# Patient Record
Sex: Female | Born: 1935 | Race: White | Hispanic: No | Smoking: Former smoker
Health system: Southern US, Community
[De-identification: ages and names within clinical notes are randomized; demographics above are authoritative.]

## PROBLEM LIST (undated history)

## (undated) DIAGNOSIS — I739 Peripheral vascular disease, unspecified: Secondary | ICD-10-CM

## (undated) DIAGNOSIS — I2510 Atherosclerotic heart disease of native coronary artery without angina pectoris: Secondary | ICD-10-CM

## (undated) DIAGNOSIS — I10 Essential (primary) hypertension: Secondary | ICD-10-CM

## (undated) DIAGNOSIS — T4145XA Adverse effect of unspecified anesthetic, initial encounter: Secondary | ICD-10-CM

## (undated) DIAGNOSIS — I480 Paroxysmal atrial fibrillation: Secondary | ICD-10-CM

## (undated) DIAGNOSIS — I38 Endocarditis, valve unspecified: Secondary | ICD-10-CM

## (undated) DIAGNOSIS — D509 Iron deficiency anemia, unspecified: Secondary | ICD-10-CM

## (undated) DIAGNOSIS — K5790 Diverticulosis of intestine, part unspecified, without perforation or abscess without bleeding: Secondary | ICD-10-CM

## (undated) DIAGNOSIS — N289 Disorder of kidney and ureter, unspecified: Secondary | ICD-10-CM

## (undated) DIAGNOSIS — K219 Gastro-esophageal reflux disease without esophagitis: Secondary | ICD-10-CM

## (undated) DIAGNOSIS — J302 Other seasonal allergic rhinitis: Secondary | ICD-10-CM

## (undated) DIAGNOSIS — R0989 Other specified symptoms and signs involving the circulatory and respiratory systems: Secondary | ICD-10-CM

## (undated) DIAGNOSIS — G629 Polyneuropathy, unspecified: Secondary | ICD-10-CM

## (undated) DIAGNOSIS — Z9289 Personal history of other medical treatment: Secondary | ICD-10-CM

## (undated) DIAGNOSIS — I272 Pulmonary hypertension, unspecified: Secondary | ICD-10-CM

## (undated) DIAGNOSIS — T8859XA Other complications of anesthesia, initial encounter: Secondary | ICD-10-CM

## (undated) DIAGNOSIS — R55 Syncope and collapse: Secondary | ICD-10-CM

## (undated) DIAGNOSIS — E785 Hyperlipidemia, unspecified: Secondary | ICD-10-CM

## (undated) DIAGNOSIS — G8929 Other chronic pain: Secondary | ICD-10-CM

## (undated) DIAGNOSIS — C4490 Unspecified malignant neoplasm of skin, unspecified: Secondary | ICD-10-CM

## (undated) DIAGNOSIS — R413 Other amnesia: Secondary | ICD-10-CM

## (undated) DIAGNOSIS — Z7901 Long term (current) use of anticoagulants: Secondary | ICD-10-CM

## (undated) DIAGNOSIS — R269 Unspecified abnormalities of gait and mobility: Secondary | ICD-10-CM

## (undated) DIAGNOSIS — M549 Dorsalgia, unspecified: Secondary | ICD-10-CM

## (undated) DIAGNOSIS — F419 Anxiety disorder, unspecified: Secondary | ICD-10-CM

## (undated) DIAGNOSIS — Z8719 Personal history of other diseases of the digestive system: Secondary | ICD-10-CM

## (undated) DIAGNOSIS — M1990 Unspecified osteoarthritis, unspecified site: Secondary | ICD-10-CM

## (undated) DIAGNOSIS — I509 Heart failure, unspecified: Secondary | ICD-10-CM

## (undated) DIAGNOSIS — I341 Nonrheumatic mitral (valve) prolapse: Secondary | ICD-10-CM

## (undated) HISTORY — DX: Other amnesia: R41.3

## (undated) HISTORY — PX: FRACTURE SURGERY: SHX138

## (undated) HISTORY — DX: Endocarditis, valve unspecified: I38

## (undated) HISTORY — PX: TONSILLECTOMY: SUR1361

## (undated) HISTORY — PX: FOOT FRACTURE SURGERY: SHX645

## (undated) HISTORY — DX: Syncope and collapse: R55

## (undated) HISTORY — DX: Polyneuropathy, unspecified: G62.9

## (undated) HISTORY — DX: Paroxysmal atrial fibrillation: I48.0

## (undated) HISTORY — DX: Pulmonary hypertension, unspecified: I27.20

## (undated) HISTORY — DX: Disorder of kidney and ureter, unspecified: N28.9

## (undated) HISTORY — PX: APPENDECTOMY: SHX54

## (undated) HISTORY — DX: Diverticulosis of intestine, part unspecified, without perforation or abscess without bleeding: K57.90

## (undated) HISTORY — DX: Atherosclerotic heart disease of native coronary artery without angina pectoris: I25.10

## (undated) HISTORY — PX: JOINT REPLACEMENT: SHX530

## (undated) HISTORY — DX: Other specified symptoms and signs involving the circulatory and respiratory systems: R09.89

## (undated) HISTORY — PX: UMBILICAL HERNIA REPAIR: SHX196

## (undated) HISTORY — PX: FERTILITY SURGERY: SHX945

## (undated) HISTORY — PX: DILATION AND CURETTAGE OF UTERUS: SHX78

## (undated) HISTORY — DX: Personal history of other medical treatment: Z92.89

## (undated) HISTORY — PX: ABDOMINAL HYSTERECTOMY: SHX81

## (undated) HISTORY — DX: Essential (primary) hypertension: I10

## (undated) HISTORY — DX: Unspecified osteoarthritis, unspecified site: M19.90

## (undated) HISTORY — PX: CARPAL TUNNEL RELEASE: SHX101

## (undated) HISTORY — PX: TEAR DUCT PROBING: SHX793

## (undated) HISTORY — DX: Unspecified abnormalities of gait and mobility: R26.9

## (undated) HISTORY — PX: BUNIONECTOMY: SHX129

## (undated) HISTORY — PX: CHOLECYSTECTOMY OPEN: SUR202

---

## 1998-05-04 ENCOUNTER — Emergency Department (HOSPITAL_COMMUNITY): Admission: EM | Admit: 1998-05-04 | Discharge: 1998-05-04 | Payer: Self-pay | Admitting: Emergency Medicine

## 1998-12-12 ENCOUNTER — Other Ambulatory Visit: Admission: RE | Admit: 1998-12-12 | Discharge: 1998-12-12 | Payer: Self-pay | Admitting: *Deleted

## 2000-02-27 ENCOUNTER — Other Ambulatory Visit: Admission: RE | Admit: 2000-02-27 | Discharge: 2000-02-27 | Payer: Self-pay | Admitting: *Deleted

## 2000-07-04 ENCOUNTER — Other Ambulatory Visit: Admission: RE | Admit: 2000-07-04 | Discharge: 2000-07-04 | Payer: Self-pay | Admitting: Orthopedic Surgery

## 2001-04-03 ENCOUNTER — Other Ambulatory Visit: Admission: RE | Admit: 2001-04-03 | Discharge: 2001-04-03 | Payer: Self-pay | Admitting: Gastroenterology

## 2001-04-03 ENCOUNTER — Encounter (INDEPENDENT_AMBULATORY_CARE_PROVIDER_SITE_OTHER): Payer: Self-pay | Admitting: Specialist

## 2002-10-08 ENCOUNTER — Encounter: Admission: RE | Admit: 2002-10-08 | Discharge: 2002-10-08 | Payer: Self-pay | Admitting: Neurology

## 2002-10-08 ENCOUNTER — Encounter: Payer: Self-pay | Admitting: Neurology

## 2002-12-24 ENCOUNTER — Encounter: Payer: Self-pay | Admitting: Internal Medicine

## 2002-12-24 ENCOUNTER — Ambulatory Visit (HOSPITAL_COMMUNITY): Admission: RE | Admit: 2002-12-24 | Discharge: 2002-12-24 | Payer: Self-pay | Admitting: Pulmonary Disease

## 2003-03-12 ENCOUNTER — Encounter: Payer: Self-pay | Admitting: Emergency Medicine

## 2003-03-12 ENCOUNTER — Emergency Department (HOSPITAL_COMMUNITY): Admission: EM | Admit: 2003-03-12 | Discharge: 2003-03-12 | Payer: Self-pay | Admitting: Emergency Medicine

## 2004-01-24 ENCOUNTER — Other Ambulatory Visit: Admission: RE | Admit: 2004-01-24 | Discharge: 2004-01-24 | Payer: Self-pay | Admitting: Internal Medicine

## 2004-10-15 ENCOUNTER — Emergency Department (HOSPITAL_COMMUNITY): Admission: EM | Admit: 2004-10-15 | Discharge: 2004-10-15 | Payer: Self-pay | Admitting: Emergency Medicine

## 2004-11-08 ENCOUNTER — Ambulatory Visit: Payer: Self-pay | Admitting: Gastroenterology

## 2005-01-01 ENCOUNTER — Emergency Department (HOSPITAL_COMMUNITY): Admission: EM | Admit: 2005-01-01 | Discharge: 2005-01-01 | Payer: Self-pay | Admitting: Family Medicine

## 2005-04-20 ENCOUNTER — Ambulatory Visit: Payer: Self-pay | Admitting: Gastroenterology

## 2005-05-03 ENCOUNTER — Ambulatory Visit: Payer: Self-pay | Admitting: Gastroenterology

## 2005-11-14 ENCOUNTER — Ambulatory Visit: Payer: Self-pay | Admitting: Gastroenterology

## 2005-12-19 ENCOUNTER — Encounter: Admission: RE | Admit: 2005-12-19 | Discharge: 2005-12-19 | Payer: Self-pay | Admitting: Neurology

## 2006-01-25 ENCOUNTER — Ambulatory Visit (HOSPITAL_COMMUNITY): Admission: RE | Admit: 2006-01-25 | Discharge: 2006-01-26 | Payer: Self-pay | Admitting: General Surgery

## 2006-02-27 ENCOUNTER — Ambulatory Visit: Payer: Self-pay | Admitting: Gastroenterology

## 2006-04-03 ENCOUNTER — Ambulatory Visit: Payer: Self-pay | Admitting: Gastroenterology

## 2006-07-30 ENCOUNTER — Ambulatory Visit: Payer: Self-pay | Admitting: Gastroenterology

## 2006-08-05 ENCOUNTER — Ambulatory Visit: Payer: Self-pay | Admitting: Gastroenterology

## 2007-01-27 ENCOUNTER — Ambulatory Visit: Payer: Self-pay | Admitting: Gastroenterology

## 2007-09-08 ENCOUNTER — Other Ambulatory Visit: Admission: RE | Admit: 2007-09-08 | Discharge: 2007-09-08 | Payer: Self-pay | Admitting: Internal Medicine

## 2007-09-19 DIAGNOSIS — Z9289 Personal history of other medical treatment: Secondary | ICD-10-CM

## 2007-09-19 HISTORY — DX: Personal history of other medical treatment: Z92.89

## 2007-12-09 ENCOUNTER — Encounter: Admission: RE | Admit: 2007-12-09 | Discharge: 2007-12-10 | Payer: Self-pay | Admitting: Internal Medicine

## 2008-02-14 ENCOUNTER — Emergency Department (HOSPITAL_COMMUNITY): Admission: EM | Admit: 2008-02-14 | Discharge: 2008-02-14 | Payer: Self-pay | Admitting: Emergency Medicine

## 2008-03-20 HISTORY — PX: REPLACEMENT TOTAL KNEE: SUR1224

## 2008-03-31 ENCOUNTER — Inpatient Hospital Stay (HOSPITAL_COMMUNITY): Admission: RE | Admit: 2008-03-31 | Discharge: 2008-04-04 | Payer: Self-pay | Admitting: Orthopedic Surgery

## 2008-04-12 ENCOUNTER — Emergency Department (HOSPITAL_COMMUNITY): Admission: EM | Admit: 2008-04-12 | Discharge: 2008-04-12 | Payer: Self-pay | Admitting: Emergency Medicine

## 2008-04-15 ENCOUNTER — Encounter: Admission: RE | Admit: 2008-04-15 | Discharge: 2008-04-15 | Payer: Self-pay | Admitting: Orthopedic Surgery

## 2011-01-02 NOTE — Assessment & Plan Note (Signed)
[facility]                         GASTROENTEROLOGY OFFICE NOTE   NAME:Thayne, [name]                   MRN:          [id number]  DATE:01/27/2007                            DOB:          October 02, 1921    The patient has been in this practice for a number of years. She has  seen all my brothers and myself and is now concerned because we are  retiring and she wants to see who should she followup with in the  future. She says she is here just to talk before I leave. She does have  some mild gastritis and some diverticular disease which is mild as well.  Her last colonoscopic examination was in September 2006 and revealed  only some hemorrhoids and some diverticular disease as we noted, it was  moderately severe. Exam at the same time revealed the 5-cm hiatal hernia  with a stricture which we dilated and some mild to moderate hemorrhagic  gastritis. In the meantime, she has been taking medication for her  stomach and which has been Prevacid, SolTabs and Sucralfate, Pepcid as  needed along with a number of other medicines.   PHYSICAL EXAMINATION:  GENERAL:  She comes in, she looks good.  VITAL SIGNS:  She weighs 144, blood pressure 98/62, pulse 80 and normal.  HEENT:  Her oropharynx is negative.  NECK:  Negative.  CHEST:  Clear.  HEART:  Regular rhythm without significant murmur.  ABDOMEN:  Soft, no mass, no organomegaly, nondistended, nontender.  EXTREMITIES:  Unremarkable.  RECTAL:  Deferred.   IMPRESSION:  1. Abdominal pain in a patient who is status post cholecystectomy and      has mild hiatal hernia and gastritis.  2. Mild to moderate diverticulosis.  3. History of gastroesophageal reflux disease. The patient is status      post cholecystectomy and gastroenteritis.  4. She also has known multiple allergies, mitral valve disease,      arthritis and mild neuropathy treated with Neurontin.   RECOMMENDATIONS:  See Dr. Leone Payor in the future in  followup and after my  retirement and to be treated accordingly. Her medications as noted  included some Prevacid, SolTab and some Sucralfate.     Ulyess Mort, MD  Electronically Signed    SML/MedQ  DD: 01/27/2007  DT: 01/28/2007  Job #: [id number]

## 2011-01-02 NOTE — H&P (Signed)
NAMEGEORGI, [name] NO.:  [id number]   MEDICAL RECORD NO.:  [id number]          PATIENT TYPE:  INP   LOCATION:  1605                         FACILITY:  [facility]   PHYSICIAN:  Ollen Gross, M.D.    DATE OF BIRTH:  [birth year]   DATE OF ADMISSION:  03/31/2008  DATE OF DISCHARGE:                              HISTORY & PHYSICAL   OFFICE VISIT HISTORY AND PHYSICAL:  Of note, the date of the office  visit history and physical was March 19, 2008   CHIEF COMPLAINT:  Right knee pain.   HISTORY OF THE PRESENT ILLNESS:  The patient is an 77 year old female  who has been seen by Dr. Lequita Halt for ongoing right knee pain.  She has  known arthritis in the right knee, which has been end-stage.  She has  been treated conservatively for this in the past utilizing medications  and also a trial of Synvisc this past year for which she has only gotten  temporary short-term relief with this.  She has reached the point now  where she would like to have something done about it.  She has been seen  preoperatively by Dr. Tresa Endo and is felt to be stable for surgery   ALLERGIES:  SULFA MEDICATIONS cause swelling within her face.   CURRENT MEDICATIONS:  Claritin, flunisolide nasal solution, flaxseed  oil, gabapentin, benazepril hydrochloride, atenolol, simvastatin,  Tricor, Lidoderm patches, Vicodin, sucralfate, Prevacid, Restasis; and,  also additional vitamins of calcium with vitamin D, Senior vitamin,  vitamin B12, _________, and probiotic   PAST MEDICAL HISTORY:  1. Anxiety.  2. Cataracts.  3. Tinnitus.  4. Chronic dry eyes.  5. Hypertension.  6. Peripheral neuropathy.  7. Hyperlipidemia.  8. Mitral valve prolapse with mild-to-moderate mitral regurgitation.  9. Moderate tricuspid regurgitation.  10.Hiatal hernia.  11.Gastroesophageal reflux disease.  12.Hemorrhoids.  13.Diverticulosis.  14.Past history of Clostridium difficile.  15.Osteoporosis.  16.Eczema.  17.Left  ventricular hypertrophy with diastolic dysfunction.  18.Moderate pulmonary hypertension   PAST SURGICAL HISTORY:  1. Tonsils and adenoids in 1930.  2. Appendectomy in 1937.  3. Uterine suspension procedure in 1957.  4. Hysterectomy in 1958.  5. Bilateral bunions in 1970.  6. Gallbladder surgery in 1973.  7. Left wrist cyst excision in 1979.  8. Umbilical hernia repair in 2007.  9. Toe surgery, right foot in 2008.   FAMILY HISTORY:  Father had heart disease and gastric cancer.  Mother  had hypertension and rheumatoid arthritis.  Cancer and heart disease run  in the family.   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  She is a past  smoker, quit about 44 years ago.  No alcohol.  The patient has two  children.  She does have a caregiver lined up for surgery.   REVIEW OF SYSTEMS:  GENERAL:  No fevers, chills or night sweats.  NEUROLOGICAL:  A little bit of ringing in her ears with her tinnitus.  No seizures or syncope.  RESPIRATORY:  No shortness breath, productive  cough or hemoptysis; although, she does have some allergies.  CARDIOVASCULAR:  She has had palpitations in the  past.  No chest pain,  angina or orthopnea.  GASTROINTESTINAL:  Some intermittent nausea and  heartburn reflux.  No vomiting.  Denies constipation.  Occasional  diarrhea.  GENITOURINARY:  A little bit of urinary retention, which is  controlled, and nocturia.  No dysuria.  MUSCULOSKELETAL:  Joint pain and  swelling   PHYSICAL EXAMINATION:  VITAL SIGNS: Pulse 56, respirations 12 and blood  pressure 142/64.  GENERAL APPEARANCE:  In general this is an 77 year old white female well-  nourished, well-developed, and in no acute distress.  She is alert,  oriented and cooperative; and, is accompanied by her husband.  HEENT:  Normocephalic and atraumatic.  Pupils are round and react.  Oropharynx is clear.  EOMs are intact.  NECK:  The neck is supple.  No bruits.  CHEST:  The chest is clear.  Anterior  __________  chest walls.  HEART:  The heart has a regular rate and rhythm with a grade 2/6  systolic ejection murmur heard over the aortic point, pulmonic point,  mitral, and Erb's point.  ABDOMEN:  The abdomen is soft and nontender.  Bowel sounds are present.  RECTAL, BREASTS AND GENITALIA:  The rectal, breast and genitalia exams  are not done as they are not pertinent to the history of present  illness.  EXTREMITIES:  The right knee has moderate crepitus, but no tenderness  and no instability.  Valgus malalignment deformity.   IMPRESSION:  Osteoarthritis, right knee.   PLAN:  The patient was admitted to [facility] to undergo a  right total knee replacement arthroplasty.  Surgery will be performed by  Dr. Ollen Gross.      Alexzandrew L. Perkins, P.A.C.      Ollen Gross, M.D.  Electronically Signed    ALP/MEDQ  D:  04/01/2008  T:  04/02/2008  Job:  [id number]   cc:   Soyla Murphy. Renne Crigler, M.D.  Fax: [phone]   Nicki Guadalajara, M.D.  Fax: [phone]   Areatha Keas, M.D.  Fax: [phone]   Kerry Kass, M.D. [facility]  [address]  [place]  [place] [address]   Georgiana Spinner, M.D.  Fax: [phone]

## 2011-01-02 NOTE — Op Note (Signed)
[name], [name] NO.:  [id number]   MEDICAL RECORD NO.:  [id number]          PATIENT TYPE:  INP   LOCATION:  1605                         FACILITY:  [facility]   PHYSICIAN:  Ollen Gross, M.D.    DATE OF BIRTH:  04-03-1922   DATE OF PROCEDURE:  03/31/2008  DATE OF DISCHARGE:                               OPERATIVE REPORT   PREOPERATIVE DIAGNOSIS:  Osteoarthritis, right knee.   POSTOPERATIVE DIAGNOSIS:  Osteoarthritis, right knee.   PROCEDURE:  Right total knee arthroplasty.   SURGEON:  Ollen Gross, M.D.   ASSISTANT:  Avel Peace, PA-C.   ANESTHESIA:  Spinal with Duramorph.   ESTIMATED BLOOD LOSS:  Minimal.   DRAINS:  None.   TOURNIQUET TIME:  32 minutes at 300 mmHg.   COMPLICATIONS:  None.   CONDITION.:  Stable to recovery.   CLINICAL NOTE:  [name] is an 77 year old female with end-stage  arthritis of the right knee with progressively worsening pain and  dysfunction.  She has failed nonoperative management and presents for  total knee arthroplasty.   PROCEDURE IN DETAIL:  After successful administration of spinal  anesthetic, a tourniquet was placed on her right thigh and the right  lower extremity prepped and draped in the usual sterile fashion.  The  extremity was wrapped in an Esmarch, the knee flexed, and the tourniquet  inflated to 300 mmHg.  A midline incision was made with 10 blade through  the subcutaneous tissue to the level of the extensor mechanism.  A fresh  blade was used make a medial parapatellar arthrotomy.  The soft tissue  over the proximal medial tibia was subperiosteally elevated to the joint  line with the knife and into the semimembranosus bursa with a Cobb  elevator.  The soft tissue laterally was elevated, with attention being  paid to avoid the patellar tendon on the tibial tubercle.  The patella  subluxed laterally, the knee flexed 90 degrees and the ACL and PCL  removed.  A drill was used to create a starting  hole in the distal  femur.  The canal was thoroughly irrigated.  A 5-degree right valgus  alignment guide was placed referencing off the posterior condyles,  rotations marked and the block pinned to remove 11 mm of the distal  femur.  I took 11 because of her preop flexion contracture.  Distal  femoral resection was made with an oscillating saw.  Size 3 is the most  appropriate femoral component and the size 3 cutting block was placed  with rotation marked off the epicondylar axis.  The anterior, posterior  and chamfer cuts were then made.   The tibia subluxed forward and menisci removed.  The extramedullary  tibial alignment guide was placed referencing proximally at the medial  aspect of the tibial tubercle and distally along the second metatarsal  axis and tibial crest.  The block was pinned to remove 2 mm of the more  deficient lateral side.  Tibial resection was made with an oscillating  saw.  A size 3 was also the most appropriate tibial size and the  proximal tibia was prepared  with the modular drill and keel punch for a  size 3.  Femoral preparation was completed with the intercondylar cut.   A size 3 mobile bearing tibial trial, size 3 posterior stabilized  femoral trial, and a 12.5-mm posterior stabilized rotating platform  insert trial were placed.  With the 12.5, full extension was achieved  with excellent varus, valgus, anterior and posterior balance throughout  a full range of motion.  The patella was then everted, thickness  measured to 23 mm.  Freehand resection was taken to 14 mm, a 38 template  was placed, lug holes were drilled, and a trial patella was placed and  it tracked normally.  Osteophytes were removed off the posterior femur  with the trial in place.  All trials were removed, then the cut bone  surfaces were prepared with pulsatile lavage.  Cement was mixed and once  ready for implantation, the size 3 mobile bearing tibial tray, size 3  posterior stabilized  femur, and 38 patella were cemented into place and  the patella was held with a clamp.  The trial 12.5-mm insert was placed,  the knee held in full extension and all extruded cement removed.  Once  the cement was fully hardened, then the permanent 12.5-mm posterior  stabilized rotating platform insert was placed in the tibial tray.  It  was copiously irrigated with saline solution.  FloSeal was injected on  the posterior capsule and then injected into the medial and lateral  gutters and suprapatellar area.  A moist sponge was placed and the  tourniquet released for total time of 32 minutes.  The sponge was held  for 2 minutes, then removed.  Minimal bleeding was encountered.  That  which was encountered was stopped with electrocautery.  The wound was  again further irrigated with saline solution and the arthrotomy closed  with interrupted #1 PDS.  Flexion against gravity was 135 degrees.  The  patella tracked normally.  The subcu was closed with interrupted 2-0  Vicryl and subcuticular running 4-0 Monocryl.  The incision was cleaned  and dried and Steri-Strips and a bulky sterile dressing applied.  She  was placed into a knee immobilizer, awakened and transported to recovery  in stable condition.      Ollen Gross, M.D.  Electronically Signed     FA/MEDQ  D:  03/31/2008  T:  03/31/2008  Job:  [id number]

## 2011-01-05 NOTE — Assessment & Plan Note (Signed)
[facility]                           GASTROENTEROLOGY OFFICE NOTE   NAME:[name], [name]                   MRN:          [id number]  DATE:02/27/2006                            DOB:          [birth year]    PROBLEM:  Diarrhea.   HISTORY:  [name] is a very nice 77 year old white female known to Dr. Victorino Dike who has a history of chronic GERD which is currently well  controlled. She was having difficulty with a periumbilical hernia which she  recently has had repaired in June 2007 and is doing well postoperatively.  She last had colonoscopy in September 2006 which showed moderate  diverticulosis and internal and external hemorrhoids.   The patient says that she has always tended towards looser stools, but is  concerned now that she may have an infection.  She says her husband, who is  now living at the [facility], has been having a hard time with C.  difficile colitis and has been on treatment intermittently since February.  She has spent a lot of time with him and is concerned that she may have  contracted it.  She says that her stools are different now. She usually  has a formed stool in the morning and then two to three watery stools later  in the day. She has not noted any melena or hematochezia.  She has not had  any documented fever, but has had some episodes of hot flashes.  She is  not having any nocturnal diarrhea. Her appetite has been fair. She has lost  weight over the past few months and  by our records is down 11 pounds since  March, but says she has been under a lot of stress with her husband's  illness, etc., and feels that she has not been eating because of stress.  She,  herself, has not been treated with any antibiotics other than a single  dose about a month ago prior to a dental visit.  She does not believe that  she has had any antibiotics around the time of her recent surgery.   CURRENT MEDICATIONS:  1.  Neurontin 600  mg t.i.d.  2.  Atenolol one-half daily.  3.  Lotrel 5/20 daily.  4.  Hydrochlorothiazide one daily.  5.  Tricor 200 daily.  6.  Multivitamin daily.  7.  Vitamin E and C daily.  8.  Prevacid Solutab 30 daily.  9.  Metoclopramide 10 mg one -half tablet with dinner and a whole q.h.s.  10. Flax seed oil t.i.d.  11. Carafate t.i.d. p.r.n.  12. Vicodin p.r.n.   ALLERGIES:  SULFA and LATEX.   PHYSICAL EXAMINATION:  GENERAL: Well-developed, elderly white female in no  acute distress.  VITAL SIGNS:  Weight is 142.4, blood pressure 118/54, pulse 60.  CARDIOVASCULAR:  Regular rhythm with normal S1 S2.  There are no murmurs,  gallops or rubs.  PULMONARY:  Clear to A&P.  ABDOMEN:  Soft.  Bowel sounds are active, somewhat  hyperactive.  She is  nontender. She has no palpable mass or hepatosplenomegaly.  RECTAL EXAM:  Not done at this  time.   IMPRESSION:  1.  An 77 year old white female status post recent periumbilical hernia      repair, doing well.  2.  Diarrhea times two to three months.  Rule out Clostridium difficile.   PLAN:  1.  Check stool for Clostridium difficile.  2.  Flagyl 250 mg p.o. q.i.d. times two weeks.  3.  Fluoroquinolone one p.o. daily times four days.  4.  Return to office in one month or sooner p.r.n.                                   Mike Gip, PA-C                                Ulyess Mort, MD   AE/MedQ  DD:  02/27/2006  DT:  02/27/2006  Job #:  [id number]

## 2011-01-05 NOTE — Discharge Summary (Signed)
[name], [name] NO.:  [id number]   MEDICAL RECORD NO.:  [id number]          PATIENT TYPE:  EMS   LOCATION:  ED                           [facility]ACILITY[facility]  [facility]   PHYSICIAN:  Ollen Gross, M.D.    DATE OF BIRTH:  12/26/1921   DATE OF ADMISSION:  04/12/2008  DATE OF DISCHARGE:  04/12/2008                               DISCHARGE SUMMARY   ADMISSION DIAGNOSES:  1. Osteoarthritis right knee.  2. Anxiety.  3. Cataracts  4. Tinnitus.  5. Chronic dry eyes.  6. Hypertension.  7. Peripheral neuropathy.  8. Hyperlipidemia.  9. Mitral valve prolapse with mild to moderate mitral regurgitation.  10.Moderate tricuspid regurgitation.  11.Hiatal hernia.  12.Reflux disease.  13.Hemorrhoids.  14.Diverticulosis.  15.Past history of Clostridium difficile.  16.Osteoporosis.  17.Left ventricular hypertrophy with diastolic dysfunction.  18.Moderate pulmonary hypertension.   DISCHARGE DIAGNOSIS:  1. Osteoarthritis right knee status post right total knee replacement      arthroplasty.  2. Anxiety.  3. Cataracts  4. Tinnitus.  5. Chronic dry eyes.  6. Hypertension.  7. Peripheral neuropathy.  8. Hyperlipidemia.  9. Mitral valve prolapse with mild to moderate mitral regurgitation.  10.Moderate tricuspid regurgitation.  11.Hiatal hernia.  12.Reflux disease.  13.Hemorrhoids.  14.Diverticulosis.  15.Past history of Clostridium difficile.  16.Osteoporosis.  17.Left ventricular hypertrophy with diastolic dysfunction.  18.Moderate pulmonary hypertension.  19.Postop blood loss anemia.  20.Status post transfusion without sequelae.   PROCEDURE:  March 31, 2008 right total knee.  Surgeon Dr. Lequita Halt.  Assistant Avel Peace, PA-C.  Spinal anesthesia with Duramorph added.   CONSULTS:  None.   BRIEF HISTORY:  [name] is an 77 year old female with end-stage  arthritis of right knee, progressive worsening pain and dysfunction,  failed nonoperative management now presents  for total knee are   LABORATORY DATA:  Preop CBC showed hemoglobin 30.3, hematocrit 38.5,  white cell count 5.6, platelets 251,000.  Postop hemoglobin 10 drifted  down to 9.1, got as low as 8.4 given blood, post transfusion hemoglobin  10.2 and 29.6.  PT/PTT preop 13.6 and 40 respectively.  INR 1.0.  Serial  pro time followed.  PT/INR 20.8 and 1.7.  Chem panel on admission all  within normal limits.  Serial BMET followed.  Electrolytes remained  within normal limits preop UA negative.  Blood group type O positive.   EKG dated Dec 31, 2007 normal sinus rhythm, rate 59 left axis, probably  normal for age, confirmed looks like by Dr. Daphene Jaeger.   HOSPITAL COURSE:  The patient admitted to [facility] and  tolerated procedure well, later transferred to the recovery room  orthopedic floor started on PCA and p.o. analgesics pain control through  the night and into the next day.  Did pretty well after surgery given 24  hours postop IV antibiotics.  Main complaint was neuropathy type pain  with her feet.  She was on gabapentin and that was continued.  Home  medications were renewed.  She had decent output started getting up out  of bed day one.  By day two she had been weaned over p.m. meds  and  discontinued PCA and the fluids.  Hemoglobin was down a little bit to  9.1.  Placed on iron, monitor for symptoms.  She was hemodynamically  stable at that point.  Was getting up out of bed and she did much better  with a therapy by day two.  Walking about 120 feet or more that  afternoon.  Continued to progress well.  By day three she was doing  well.  Hemoglobin was a little bit low.  It is felt she would benefit  undergoing blood.  She was given 1 unit blood especially due to her  cardiac history.  She received blood, tolerated well.  Continued with  therapy by the following day of April 04, 2008 hemoglobin was back up  to 10.2, progressing well with therapy and discharged home.    DISCHARGE/PLAN:  1. Was discharged home on April 04, 2008.  2. Discharge diagnoses please see above.  3. Discharge meds Percocet, Robaxin, Coumadin.   DIET:  Low cholesterol heart healthy diet.   ACTIVITY:  Weightbearing as tolerated right leg, total knee protocol.  Home health PT and home nursing followup kin 2 weeks.  May start  showering, do not submerge incision in water.   DISPOSITION:  Home.   CONDITION ON DISCHARGE:  Improving.      Alexzandrew L. Perkins, P.A.C.      Ollen Gross, M.D.  Electronically Signed    ALP/MEDQ  D:  05/13/2008  T:  05/13/2008  Job:  [id number]   cc:   Soyla Murphy. Renne Crigler, M.D.  Fax: [phone]   Nicki Guadalajara, M.D.  Fax: [phone]   Areatha Keas, M.D.  Fax: [phone]   Kerry Kass, M.D. [facility]  [address]  [place]  [place] [address]   Georgiana Spinner, M.D.  Fax: [phone]

## 2011-01-05 NOTE — Op Note (Signed)
NAMESHAMIEKA, GULLO NO.:  [id number]   MEDICAL RECORD NO.:  [id number]          PATIENT TYPE:  AMB   LOCATION:  DAY                          FACILITY:  [facility]   PHYSICIAN:  Gita Kudo, M.D. DATE OF BIRTH:  Apr 04, 1922   DATE OF PROCEDURE:  01/25/2006  DATE OF DISCHARGE:                                 OPERATIVE REPORT   OPERATIVE PROCEDURE:  Repair of umbilical hernia with Ventralex mesh--6.4 cm   SURGEON:  Gita Kudo, M.D.   ANESTHESIA:  General.   PREOPERATIVE DIAGNOSIS:  Umbilical hernia.   POSTOP DIAGNOSIS:  Umbilical hernia.   CLINICAL SUMMARY:  A delightful, spry, 77 year old woman brought in for  elective umbilical hernia repair.  She has an enlarging hernia that is  symptomatic especially when she tries to assist caring for her sick husband.   OPERATIVE FINDINGS:  The patient is an umbilical hernia with a defect of  about 3 cm in size.  There were no incarcerated contents.   OPERATIVE PROCEDURE:  Under satisfactory general endotracheal anesthesia,  having received 1 gram Ancef preop, the patient was positioned, prepped and  draped in a standard fashion.  A curved supraumbilical incision was made and  carried down into the subcu.  Using cautery, the plane was developed between  the hernia sac and the subcu, and extended all the way down to the fascia.  The umbilicus was dissected off the hernia sac inferiorly.  With good  exposure, the sac was then circumscribed with cautery at the fascia-sac  junction and excess sac trimmed away.  The sac was then closed with a  running 2-0 Vicryl suture.   Finger dissection used to develop the preperitoneal plane; and over the sac  and under the fascia the medium sized, 6.4 cm circle of Ventralex mesh was  placed with the smooth side down.  Quadrant sutures of #0 Prolene were  placed through fascia, through the mesh, taking full-thickness bites in at  least two quadrants, and then back up through the  fascia and tied.  When  tied, the mesh lay in good apposition to the undersurface of the abdominal  wall.  Then the defect was closed in a transverse direction taking  intermittent bites of the mesh with figure-of-eight #0 Prolene  suture.  The wound was lavaged with saline, infiltrated with Marcaine, and  closed in layers with 2-0 Vicryl, 3-0 Vicryl, and Steri-Strips.  Sterile  absorbent dressings were applied; and the patient went to the recovery room  from the operating room in good condition without complication.           ______________________________  Gita Kudo, M.D.     MRL/MEDQ  D:  01/25/2006  T:  01/25/2006  Job:  [id number]   cc:   Soyla Murphy. Renne Crigler, M.D.  Fax: [phone]   Ulyess Mort, M.D. [facility]  [address]  [place]  [place] [address]

## 2011-01-05 NOTE — Assessment & Plan Note (Signed)
[facility]                           GASTROENTEROLOGY OFFICE NOTE   NAME:Cerra, [name]                   MRN:          [id number]  DATE:04/03/2006                            DOB:          1922/05/24    Arely comes in says she is doing fine.  She is such a lovely lady.  She  could not be sweeter.  She looks relatively good for her age.  She says she  has been having some fairly frequent stools but they are only in the  morning, like 2-3 in the morning.  I think it certainly relates to her  diverticular disease and she has some urgency but she has never had an  accident which she is very happy about.  She says she can live with it.  She  does have some GERD.   PRESENT MEDICATIONS:  Neurontin, atenolol, Lotrel, hydrochlorothiazide,  TriCor, multivitamin, calcium, Prevacid SoluTabs 30 mg daily, metoclopramide  1/2 b.i.d. which we are going to discontinue, flaxseed oil.  Sucralfate 1  gram t.i.d., we are going to switch this to a suspension.  Vicodin p.r.n.  and Claritin, allergy shots, Restasis. Marland Kitchen   PHYSICAL EXAMINATION:  VITAL SIGNS:  Today she weighed 141, blood pressure  128/78, pulse 58 and regular. Neck and extremities are unremarkable.   IMPRESSION:  1. Diarrhea several times a day which is probably chronic and probably      related to diverticular disease.  2. History of periumbilical hernia, doing well with repair.  3. Mild obesity.  4. History of allergies.  5. Diffuse arthritis.  6. Mild neuropathy on Neurontin.  7. Status post cholecystectomy.   RECOMMENDATIONS:  Is that she stop her Reglan as indicated, try on Prevacid  SoluTab b.i.d. or as needed.  I gave her multiple samples, and I told her  not to worry about seeds in her food since there is evidence that this  really does not contribute to diverticulitis and this will help her as well.  I told her she should cut back on the Prevacid SoluTabs if she can tolerate  it  satisfactorily and she should let me know if she has any further  difficulty.                                   Ulyess Mort, MD   SML/MedQ  DD:  04/03/2006  DT:  04/03/2006  Job #:  [id number]

## 2011-01-05 NOTE — Assessment & Plan Note (Signed)
[facility]                         GASTROENTEROLOGY OFFICE NOTE   NAME:[name], [name]                   MRN:          [id number]  DATE:08/05/2006                            DOB:          1922/07/25    [name] says she is doing better. She says she is having probably just 2-  3 stools a day. Sometimes she sees some mucous, no blood. She does have  some stomach pains and nausea at times. Took fiber and says she is  having trouble with gas. Took over-the-counter antidiarrheal and anti-  gas, she is now feeling somewhat better.   PHYSICAL EXAMINATION:  She looks fine. She weighs 139, blood pressure  116/62, pulse 56 and regular. __________  unremarkable.   IMPRESSION:  Resolving diarrhea.   RECOMMENDATIONS:  Try some Xifaxan. I gave her a week supply of just one  a day and some Flora-Q to take one a day. I told her if this helped her  we could try some more but I just did not want her to spend an undue  amount for this if it was not going to do her any good. She does not  have the finances to waste on medication unless it would be helpful to  her and I told her if her symptoms recur or persisted just to let us  know.     Ulyess Mort, MD  Electronically Signed    SML/MedQ  DD: 08/05/2006  DT: 08/06/2006  Job #: [id number]

## 2011-01-05 NOTE — Assessment & Plan Note (Signed)
[facility]                           GASTROENTEROLOGY OFFICE NOTE   NAME:[name], [name]                   MRN:          [id number]  DATE:02/27/2006                            DOB:          Jul 04, 1922    NO DICTATION.                                   Ulyess Mort, MD   SML/MedQ  DD:  02/27/2006  DT:  02/28/2006  Job #:  [id number]

## 2011-05-18 LAB — BASIC METABOLIC PANEL
BUN: 12
BUN: 8
CO2: 31
CO2: 32
Calcium: 8.9
Calcium: 9.1
Chloride: 104
Chloride: 106
Creatinine, Ser: 0.75
Creatinine, Ser: 0.82
GFR calc Af Amer: >60
GFR calc Af Amer: >60
GFR calc non Af Amer: >60
GFR calc non Af Amer: >60
Glucose, Bld: 113 — ABNORMAL HIGH
Glucose, Bld: 146 — ABNORMAL HIGH
Potassium: 3.6
Potassium: 4.1
Sodium: 140
Sodium: 142

## 2011-05-18 LAB — APTT: aPTT: 40 — ABNORMAL HIGH

## 2011-05-18 LAB — COMPREHENSIVE METABOLIC PANEL
ALT: 20
AST: 22
Albumin: 4.2
Alkaline Phosphatase: 41
BUN: 21
CO2: 30
Calcium: 10.3
Chloride: 103
Creatinine, Ser: 0.89
GFR calc Af Amer: >60
GFR calc non Af Amer: >60
Glucose, Bld: 112 — ABNORMAL HIGH
Potassium: 4.2
Sodium: 140
Total Bilirubin: 0.8
Total Protein: 7.3

## 2011-05-18 LAB — CBC
HCT: 26.3 — ABNORMAL LOW
HCT: 29 — ABNORMAL LOW
HCT: 38.5
Hemoglobin: 10 — ABNORMAL LOW
Hemoglobin: 13.3
Hemoglobin: 9.1 — ABNORMAL LOW
MCHC: 34.4
MCHC: 34.5
MCHC: 34.6
MCV: 90.7
MCV: 90.8
MCV: 91.2
Platelets: 170
Platelets: 198
Platelets: 251
RBC: 2.88 — ABNORMAL LOW
RBC: 3.19 — ABNORMAL LOW
RBC: 4.25
RDW: 11.9
RDW: 12.1
RDW: 12.3
WBC: 10.3
WBC: 5.6
WBC: 7.7

## 2011-05-18 LAB — PROTIME-INR
INR: 1
INR: 1.1
INR: 1.7 — ABNORMAL HIGH
Prothrombin Time: 13.6
Prothrombin Time: 14.6
Prothrombin Time: 20.4 — ABNORMAL HIGH

## 2011-05-18 LAB — TYPE AND SCREEN
ABO/RH(D): O POS
Antibody Screen: NEGATIVE

## 2011-05-18 LAB — URINALYSIS, ROUTINE W REFLEX MICROSCOPIC
Bilirubin Urine: NEGATIVE
Glucose, UA: NEGATIVE
Hgb urine dipstick: NEGATIVE
Ketones, ur: NEGATIVE
Nitrite: NEGATIVE
Protein, ur: NEGATIVE
Specific Gravity, Urine: 1.017
Urobilinogen, UA: 0.2
pH: 5.5

## 2011-05-18 LAB — ABO/RH: ABO/RH(D): O POS

## 2011-07-10 ENCOUNTER — Emergency Department (HOSPITAL_BASED_OUTPATIENT_CLINIC_OR_DEPARTMENT_OTHER)
Admission: EM | Admit: 2011-07-10 | Discharge: 2011-07-10 | Disposition: A | Discharge status: Home / Self Care | Payer: Medicare Other | Attending: Emergency Medicine | Admitting: Emergency Medicine

## 2011-07-10 ENCOUNTER — Emergency Department (INDEPENDENT_AMBULATORY_CARE_PROVIDER_SITE_OTHER): Payer: Medicare Other

## 2011-07-10 ENCOUNTER — Encounter: Payer: Self-pay | Admitting: *Deleted

## 2011-07-10 DIAGNOSIS — IMO0002 Reserved for concepts with insufficient information to code with codable children: Secondary | ICD-10-CM

## 2011-07-10 DIAGNOSIS — M7989 Other specified soft tissue disorders: Secondary | ICD-10-CM

## 2011-07-10 DIAGNOSIS — M79609 Pain in unspecified limb: Secondary | ICD-10-CM

## 2011-07-10 DIAGNOSIS — X58XXXA Exposure to other specified factors, initial encounter: Secondary | ICD-10-CM | POA: Insufficient documentation

## 2011-07-10 DIAGNOSIS — F411 Generalized anxiety disorder: Secondary | ICD-10-CM | POA: Insufficient documentation

## 2011-07-10 DIAGNOSIS — Z79899 Other long term (current) drug therapy: Secondary | ICD-10-CM | POA: Insufficient documentation

## 2011-07-10 DIAGNOSIS — E785 Hyperlipidemia, unspecified: Secondary | ICD-10-CM | POA: Insufficient documentation

## 2011-07-10 DIAGNOSIS — S92919A Unspecified fracture of unspecified toe(s), initial encounter for closed fracture: Secondary | ICD-10-CM

## 2011-07-10 DIAGNOSIS — I10 Essential (primary) hypertension: Secondary | ICD-10-CM | POA: Insufficient documentation

## 2011-07-10 DIAGNOSIS — K219 Gastro-esophageal reflux disease without esophagitis: Secondary | ICD-10-CM | POA: Insufficient documentation

## 2011-07-10 HISTORY — DX: Polyneuropathy, unspecified: G62.9

## 2011-07-10 HISTORY — DX: Hyperlipidemia, unspecified: E78.5

## 2011-07-10 HISTORY — DX: Gastro-esophageal reflux disease without esophagitis: K21.9

## 2011-07-10 HISTORY — DX: Anxiety disorder, unspecified: F41.9

## 2011-07-10 NOTE — ED Notes (Signed)
Pt ambulated to restroom using cane.

## 2011-07-10 NOTE — ED Notes (Signed)
Pt presents to ED today with pain and swelling noted to left foot/toe.  Pt also has blister noted to same.  Pt reports no obvious injury to area and states it was "red and just kept getting worse"

## 2011-07-10 NOTE — ED Provider Notes (Signed)
History     CSN: [id number] Arrival date & time: 07/10/2011  8:13 PM   First MD Initiated Contact with Patient 07/10/11 2109      Chief Complaint  Patient presents with  . Foot Pain  . Blister    (Consider location/radiation/quality/duration/timing/severity/associated sxs/prior treatment) Patient is a 77 y.o. female presenting with lower extremity pain. The history is provided by the patient and the spouse.  Foot Pain   the patient reports developing a blister of her left great toe today with redness and swelling as well as pain with range of motion.  She remembers no trauma to her toe however she does report that her memory is poor and she doesn't remember several days ago.  She denies use of anticoagulants.  She's had no fever or chills.  She does report wearing different shoes today however she reports the strap was tied around her ankle and it was an open box shoe with no tightness around her toes.  Nothing worsens her symptoms.  Nothing improves her symptoms.  Her symptoms are mild.  There constant  Past Medical History  Diagnosis Date  . Neuropathy   . Hypertension   . Hyperlipidemia   . GERD (gastroesophageal reflux disease)   . Anxiety     History reviewed. No pertinent past surgical history.  History reviewed. No pertinent family history.  History  Substance Use Topics  . Smoking status: Never Smoker   . Smokeless tobacco: Not on file  . Alcohol Use: No    OB History    Grav Para Term Preterm Abortions TAB SAB Ect Mult Living                  Review of Systems  All other systems reviewed and are negative.    Allergies  Codeine; Garlic; Latex; Onion; and Sulfa drugs cross reactors  Home Medications   Current Outpatient Rx  Name Route Sig Dispense Refill  . ALPRAZOLAM 0.25 MG PO TABS Oral Take 0.125 mg by mouth every other day.      . ASPIRIN EC 81 MG PO TBEC Oral Take 81 mg by mouth daily.      . ATENOLOL 25 MG PO TABS Oral Take 12.5 mg by mouth 2  (two) times daily.      Marland Kitchen BENAZEPRIL HCL 40 MG PO TABS Oral Take 40 mg by mouth daily.      Marland Kitchen CALCIUM CARBONATE-VITAMIN D 600-400 MG-UNIT PO TABS Oral Take 1 tablet by mouth 2 (two) times daily.      Marland Kitchen VITAMIN D 1000 UNITS PO TABS Oral Take 1,000 Units by mouth daily.      Marland Kitchen CITALOPRAM HYDROBROMIDE 10 MG PO TABS Oral Take 10 mg by mouth daily.      . COLESTIPOL HCL 1 G PO TABS Oral Take 1 g by mouth 2 (two) times daily as needed. For diarrhea     . CYCLOSPORINE 0.05 % OP EMUL Both Eyes Place 1 drop into both eyes daily.      Marland Kitchen DIPHENHYDRAMINE HCL 25 MG PO CAPS Oral Take 25 mg by mouth at bedtime.      . FENOFIBRATE 145 MG PO TABS Oral Take 145 mg by mouth daily.      Marland Kitchen FLUNISOLIDE 0.025 % NA SOLN Inhalation Inhale 1 spray into the lungs daily.      Marland Kitchen GABAPENTIN 300 MG PO CAPS Oral Take 300 mg by mouth 4 (four) times daily. Take 1 capsule three times a day and  take 3 capsules at bedtime      . HYDROCHLOROTHIAZIDE 25 MG PO TABS Oral Take 12.5 mg by mouth 2 (two) times daily.      Marland Kitchen HYDROCODONE-ACETAMINOPHEN 5-500 MG PO TABS Oral Take 1 tablet by mouth 4 (four) times daily.      . ACIDOPHILUS 100 MG PO CAPS Oral Take 100 mg by mouth 2 (two) times daily.      Marland Kitchen LANSOPRAZOLE 15 MG PO CPDR Oral Take 15 mg by mouth daily.      Marland Kitchen ONE-DAILY MULTI VITAMINS PO TABS Oral Take 1 tablet by mouth daily.      Marland Kitchen ROSUVASTATIN CALCIUM 5 MG PO TABS Oral Take 2.5 mg by mouth 2 (two) times daily.        BP 169/51  Pulse 56  Temp 98.7 F (37.1 C)  Resp 18  Ht 5\' 1"  (1.549 m)  Wt 137 lb (62.143 kg)  BMI 25.89 kg/m2  SpO2 100%  Physical Exam  Nursing note and vitals reviewed. Constitutional: She is oriented to person, place, and time. She appears well-developed and well-nourished. No distress.  HENT:  Head: Normocephalic.  Eyes: EOM are normal.  Neck: Normal range of motion.  Pulmonary/Chest: Effort normal and breath sounds normal.  Abdominal: She exhibits no distension. There is no tenderness.    Musculoskeletal:       Small blister at the left first MTP joint without erythema.  Normal left foot DP and PT pulses.  There is a small amount of bruising noted in this area as well as in her lateral foot.  She has mild tenderness to palpation of her left first MTP joint with mild pain with range of motion.  The blister was debrided and a small amount of serosanguineous fluid was relieved  Neurological: She is alert and oriented to person, place, and time.  Skin: Skin is warm and dry.  Psychiatric: She has a normal mood and affect. Judgment normal.    ED Course  Debridement Performed by: Lyanne Co Authorized by: Lyanne Co Patient identity confirmed: arm band Time out: Immediately prior to procedure a "time out" was called to verify the correct patient, procedure, equipment, support staff and site/side marked as required. Comments: Debridement of a small blister of the left first MTP joint on the dorsal surface.  All skin was removed.  This was removed by gauze traction.  Serosanguineous fluid was relieved.  Bacitracin was applied and a sterile bandage covered the wound    Labs Reviewed - No data to display Dg Foot Complete Left  07/10/2011  *RADIOLOGY REPORT*  Clinical Data:  Pain, blister, bruising, question infection at top of big toe by: patient denies trauma  LEFT FOOT - COMPLETE 3+ VIEW  Comparison: None  Findings: Prior bunionectomy distal first metatarsal. Mild hallux valgus. Medial soft tissue swelling at the distal first metatarsal and first MTP joint region, extending into great toe. Deformity at head of proximal phalanx left great toe, most likely representing an age indeterminate fracture. No additional fracture, dislocation, or bone destruction. Diffuse osseous demineralization.  IMPRESSION: Prior bunionectomy and mild hallux valgus. Soft tissue swelling at medial border of the left forefoot with question age indeterminate fracture of the head of proximal phalanx left  great toe. Clinical correlation for pain at this site recommended.  Original Report Authenticated By: Lollie Marrow, M.D.   I personally reviewed the x-ray  1. Toe fracture       MDM  Proximal phalanx fracture  with associated fracture blister.  Blister debrided infection warnings given.  Bacitracin applied.  Home with orthopedic followup.  Instructed to return to the ER for development of fever or spreading erythema.        Lyanne Co, MD 07/10/11 2222

## 2011-07-10 NOTE — ED Notes (Signed)
PT c/o left foot / big toe pain with redness and swelling , also blister noted

## 2011-08-24 DIAGNOSIS — Z79899 Other long term (current) drug therapy: Secondary | ICD-10-CM | POA: Diagnosis not present

## 2011-08-24 DIAGNOSIS — E7800 Pure hypercholesterolemia, unspecified: Secondary | ICD-10-CM | POA: Diagnosis not present

## 2011-08-28 DIAGNOSIS — E782 Mixed hyperlipidemia: Secondary | ICD-10-CM | POA: Diagnosis not present

## 2011-08-28 DIAGNOSIS — I10 Essential (primary) hypertension: Secondary | ICD-10-CM | POA: Diagnosis not present

## 2011-08-28 DIAGNOSIS — Z79899 Other long term (current) drug therapy: Secondary | ICD-10-CM | POA: Diagnosis not present

## 2011-08-29 DIAGNOSIS — J3089 Other allergic rhinitis: Secondary | ICD-10-CM | POA: Diagnosis not present

## 2011-08-29 DIAGNOSIS — J45909 Unspecified asthma, uncomplicated: Secondary | ICD-10-CM | POA: Diagnosis not present

## 2011-08-29 DIAGNOSIS — J3081 Allergic rhinitis due to animal (cat) (dog) hair and dander: Secondary | ICD-10-CM | POA: Diagnosis not present

## 2011-09-06 DIAGNOSIS — H40019 Open angle with borderline findings, low risk, unspecified eye: Secondary | ICD-10-CM | POA: Diagnosis not present

## 2011-09-06 DIAGNOSIS — H43399 Other vitreous opacities, unspecified eye: Secondary | ICD-10-CM | POA: Diagnosis not present

## 2011-09-06 DIAGNOSIS — H04129 Dry eye syndrome of unspecified lacrimal gland: Secondary | ICD-10-CM | POA: Diagnosis not present

## 2011-09-06 DIAGNOSIS — H43819 Vitreous degeneration, unspecified eye: Secondary | ICD-10-CM | POA: Diagnosis not present

## 2011-09-10 DIAGNOSIS — J309 Allergic rhinitis, unspecified: Secondary | ICD-10-CM | POA: Diagnosis not present

## 2011-09-13 DIAGNOSIS — R109 Unspecified abdominal pain: Secondary | ICD-10-CM | POA: Diagnosis not present

## 2011-09-13 DIAGNOSIS — R197 Diarrhea, unspecified: Secondary | ICD-10-CM | POA: Diagnosis not present

## 2011-10-02 DIAGNOSIS — R269 Unspecified abnormalities of gait and mobility: Secondary | ICD-10-CM | POA: Diagnosis not present

## 2011-10-02 DIAGNOSIS — G63 Polyneuropathy in diseases classified elsewhere: Secondary | ICD-10-CM | POA: Diagnosis not present

## 2011-10-02 DIAGNOSIS — J309 Allergic rhinitis, unspecified: Secondary | ICD-10-CM | POA: Diagnosis not present

## 2011-10-05 DIAGNOSIS — Z0000 Encounter for general adult medical examination without abnormal findings: Secondary | ICD-10-CM | POA: Diagnosis not present

## 2011-10-22 DIAGNOSIS — J309 Allergic rhinitis, unspecified: Secondary | ICD-10-CM | POA: Diagnosis not present

## 2011-11-05 DIAGNOSIS — J309 Allergic rhinitis, unspecified: Secondary | ICD-10-CM | POA: Diagnosis not present

## 2011-11-13 DIAGNOSIS — I059 Rheumatic mitral valve disease, unspecified: Secondary | ICD-10-CM | POA: Diagnosis not present

## 2011-11-13 DIAGNOSIS — I359 Nonrheumatic aortic valve disorder, unspecified: Secondary | ICD-10-CM | POA: Diagnosis not present

## 2011-11-13 DIAGNOSIS — I119 Hypertensive heart disease without heart failure: Secondary | ICD-10-CM | POA: Diagnosis not present

## 2011-11-20 DIAGNOSIS — IMO0002 Reserved for concepts with insufficient information to code with codable children: Secondary | ICD-10-CM | POA: Diagnosis not present

## 2011-11-21 DIAGNOSIS — J309 Allergic rhinitis, unspecified: Secondary | ICD-10-CM | POA: Diagnosis not present

## 2011-12-17 DIAGNOSIS — J309 Allergic rhinitis, unspecified: Secondary | ICD-10-CM | POA: Diagnosis not present

## 2012-01-01 DIAGNOSIS — J309 Allergic rhinitis, unspecified: Secondary | ICD-10-CM | POA: Diagnosis not present

## 2012-02-25 DIAGNOSIS — R197 Diarrhea, unspecified: Secondary | ICD-10-CM | POA: Diagnosis not present

## 2012-03-13 DIAGNOSIS — Z79899 Other long term (current) drug therapy: Secondary | ICD-10-CM | POA: Diagnosis not present

## 2012-03-13 DIAGNOSIS — I10 Essential (primary) hypertension: Secondary | ICD-10-CM | POA: Diagnosis not present

## 2012-03-13 DIAGNOSIS — Z0000 Encounter for general adult medical examination without abnormal findings: Secondary | ICD-10-CM | POA: Diagnosis not present

## 2012-03-13 DIAGNOSIS — E782 Mixed hyperlipidemia: Secondary | ICD-10-CM | POA: Diagnosis not present

## 2012-03-19 DIAGNOSIS — M542 Cervicalgia: Secondary | ICD-10-CM | POA: Diagnosis not present

## 2012-03-19 DIAGNOSIS — M159 Polyosteoarthritis, unspecified: Secondary | ICD-10-CM | POA: Diagnosis not present

## 2012-03-19 DIAGNOSIS — R197 Diarrhea, unspecified: Secondary | ICD-10-CM | POA: Diagnosis not present

## 2012-03-19 DIAGNOSIS — I10 Essential (primary) hypertension: Secondary | ICD-10-CM | POA: Diagnosis not present

## 2012-03-24 DIAGNOSIS — R197 Diarrhea, unspecified: Secondary | ICD-10-CM | POA: Diagnosis not present

## 2012-05-01 DIAGNOSIS — R197 Diarrhea, unspecified: Secondary | ICD-10-CM | POA: Diagnosis not present

## 2012-05-01 DIAGNOSIS — F329 Major depressive disorder, single episode, unspecified: Secondary | ICD-10-CM | POA: Diagnosis not present

## 2012-05-01 DIAGNOSIS — Z23 Encounter for immunization: Secondary | ICD-10-CM | POA: Diagnosis not present

## 2012-06-10 DIAGNOSIS — J309 Allergic rhinitis, unspecified: Secondary | ICD-10-CM | POA: Diagnosis not present

## 2012-06-12 DIAGNOSIS — G4733 Obstructive sleep apnea (adult) (pediatric): Secondary | ICD-10-CM | POA: Diagnosis not present

## 2012-06-12 DIAGNOSIS — E119 Type 2 diabetes mellitus without complications: Secondary | ICD-10-CM | POA: Diagnosis not present

## 2012-06-12 DIAGNOSIS — R002 Palpitations: Secondary | ICD-10-CM | POA: Diagnosis not present

## 2012-06-12 DIAGNOSIS — R0989 Other specified symptoms and signs involving the circulatory and respiratory systems: Secondary | ICD-10-CM | POA: Diagnosis not present

## 2012-06-12 DIAGNOSIS — R0609 Other forms of dyspnea: Secondary | ICD-10-CM | POA: Diagnosis not present

## 2012-06-13 DIAGNOSIS — B351 Tinea unguium: Secondary | ICD-10-CM | POA: Diagnosis not present

## 2012-06-13 DIAGNOSIS — M2040 Other hammer toe(s) (acquired), unspecified foot: Secondary | ICD-10-CM | POA: Diagnosis not present

## 2012-06-13 DIAGNOSIS — L851 Acquired keratosis [keratoderma] palmaris et plantaris: Secondary | ICD-10-CM | POA: Diagnosis not present

## 2012-06-13 DIAGNOSIS — M2010 Hallux valgus (acquired), unspecified foot: Secondary | ICD-10-CM | POA: Diagnosis not present

## 2012-07-02 DIAGNOSIS — E2839 Other primary ovarian failure: Secondary | ICD-10-CM | POA: Diagnosis not present

## 2012-07-03 ENCOUNTER — Other Ambulatory Visit (HOSPITAL_COMMUNITY): Payer: Self-pay | Admitting: Cardiovascular Disease

## 2012-07-03 ENCOUNTER — Other Ambulatory Visit (HOSPITAL_COMMUNITY): Payer: Self-pay

## 2012-07-03 DIAGNOSIS — I2789 Other specified pulmonary heart diseases: Secondary | ICD-10-CM

## 2012-07-03 DIAGNOSIS — I10 Essential (primary) hypertension: Secondary | ICD-10-CM

## 2012-07-03 DIAGNOSIS — R0989 Other specified symptoms and signs involving the circulatory and respiratory systems: Secondary | ICD-10-CM

## 2012-07-15 ENCOUNTER — Encounter (HOSPITAL_COMMUNITY): Payer: Medicare Other

## 2012-07-15 ENCOUNTER — Ambulatory Visit (HOSPITAL_COMMUNITY): Payer: Medicare Other

## 2012-07-30 ENCOUNTER — Ambulatory Visit (HOSPITAL_COMMUNITY)
Admission: RE | Admit: 2012-07-30 | Discharge: 2012-07-30 | Disposition: A | Discharge status: Home / Self Care | Payer: Medicare Other | Source: Ambulatory Visit | Attending: Cardiovascular Disease | Admitting: Cardiovascular Disease

## 2012-07-30 DIAGNOSIS — I2789 Other specified pulmonary heart diseases: Secondary | ICD-10-CM

## 2012-07-30 DIAGNOSIS — I359 Nonrheumatic aortic valve disorder, unspecified: Secondary | ICD-10-CM | POA: Diagnosis not present

## 2012-07-30 DIAGNOSIS — I059 Rheumatic mitral valve disease, unspecified: Secondary | ICD-10-CM | POA: Diagnosis not present

## 2012-07-30 DIAGNOSIS — R0989 Other specified symptoms and signs involving the circulatory and respiratory systems: Secondary | ICD-10-CM | POA: Diagnosis not present

## 2012-07-30 DIAGNOSIS — I779 Disorder of arteries and arterioles, unspecified: Secondary | ICD-10-CM

## 2012-07-30 DIAGNOSIS — I10 Essential (primary) hypertension: Secondary | ICD-10-CM | POA: Diagnosis not present

## 2012-07-30 DIAGNOSIS — I6529 Occlusion and stenosis of unspecified carotid artery: Secondary | ICD-10-CM | POA: Diagnosis not present

## 2012-07-30 HISTORY — DX: Disorder of arteries and arterioles, unspecified: I77.9

## 2012-07-30 NOTE — Progress Notes (Signed)
[facility]   2D echo completed 07/30/2012.   Veda Canning, RDCS

## 2012-07-31 NOTE — Progress Notes (Signed)
Carotid duplex completed 

## 2012-08-19 DIAGNOSIS — H539 Unspecified visual disturbance: Secondary | ICD-10-CM | POA: Diagnosis not present

## 2012-08-21 DIAGNOSIS — H43819 Vitreous degeneration, unspecified eye: Secondary | ICD-10-CM | POA: Diagnosis not present

## 2012-08-21 DIAGNOSIS — H40019 Open angle with borderline findings, low risk, unspecified eye: Secondary | ICD-10-CM | POA: Diagnosis not present

## 2012-08-21 DIAGNOSIS — R197 Diarrhea, unspecified: Secondary | ICD-10-CM | POA: Diagnosis not present

## 2012-08-21 DIAGNOSIS — H2510 Age-related nuclear cataract, unspecified eye: Secondary | ICD-10-CM | POA: Diagnosis not present

## 2012-08-28 DIAGNOSIS — J45909 Unspecified asthma, uncomplicated: Secondary | ICD-10-CM | POA: Diagnosis not present

## 2012-08-28 DIAGNOSIS — J3081 Allergic rhinitis due to animal (cat) (dog) hair and dander: Secondary | ICD-10-CM | POA: Diagnosis not present

## 2012-08-28 DIAGNOSIS — J3089 Other allergic rhinitis: Secondary | ICD-10-CM | POA: Diagnosis not present

## 2012-09-12 DIAGNOSIS — L03119 Cellulitis of unspecified part of limb: Secondary | ICD-10-CM | POA: Diagnosis not present

## 2012-09-12 DIAGNOSIS — L02619 Cutaneous abscess of unspecified foot: Secondary | ICD-10-CM | POA: Diagnosis not present

## 2012-09-15 DIAGNOSIS — I10 Essential (primary) hypertension: Secondary | ICD-10-CM | POA: Diagnosis not present

## 2012-09-26 DIAGNOSIS — L02619 Cutaneous abscess of unspecified foot: Secondary | ICD-10-CM | POA: Diagnosis not present

## 2012-09-26 DIAGNOSIS — M2010 Hallux valgus (acquired), unspecified foot: Secondary | ICD-10-CM | POA: Diagnosis not present

## 2012-09-26 DIAGNOSIS — E785 Hyperlipidemia, unspecified: Secondary | ICD-10-CM | POA: Diagnosis not present

## 2012-09-27 ENCOUNTER — Emergency Department (HOSPITAL_COMMUNITY)
Admission: EM | Admit: 2012-09-27 | Discharge: 2012-09-27 | Disposition: A | Discharge status: Home / Self Care | Payer: Medicare Other | Attending: Emergency Medicine | Admitting: Emergency Medicine

## 2012-09-27 ENCOUNTER — Encounter (HOSPITAL_COMMUNITY): Payer: Self-pay | Admitting: Emergency Medicine

## 2012-09-27 DIAGNOSIS — R6889 Other general symptoms and signs: Secondary | ICD-10-CM | POA: Insufficient documentation

## 2012-09-27 DIAGNOSIS — E785 Hyperlipidemia, unspecified: Secondary | ICD-10-CM | POA: Diagnosis not present

## 2012-09-27 DIAGNOSIS — M79673 Pain in unspecified foot: Secondary | ICD-10-CM

## 2012-09-27 DIAGNOSIS — K219 Gastro-esophageal reflux disease without esophagitis: Secondary | ICD-10-CM | POA: Diagnosis not present

## 2012-09-27 DIAGNOSIS — Z79899 Other long term (current) drug therapy: Secondary | ICD-10-CM | POA: Diagnosis not present

## 2012-09-27 DIAGNOSIS — Z8669 Personal history of other diseases of the nervous system and sense organs: Secondary | ICD-10-CM | POA: Insufficient documentation

## 2012-09-27 DIAGNOSIS — R490 Dysphonia: Secondary | ICD-10-CM | POA: Insufficient documentation

## 2012-09-27 DIAGNOSIS — T7840XA Allergy, unspecified, initial encounter: Secondary | ICD-10-CM

## 2012-09-27 DIAGNOSIS — H9319 Tinnitus, unspecified ear: Secondary | ICD-10-CM | POA: Insufficient documentation

## 2012-09-27 DIAGNOSIS — I059 Rheumatic mitral valve disease, unspecified: Secondary | ICD-10-CM | POA: Insufficient documentation

## 2012-09-27 DIAGNOSIS — R1310 Dysphagia, unspecified: Secondary | ICD-10-CM | POA: Insufficient documentation

## 2012-09-27 DIAGNOSIS — IMO0002 Reserved for concepts with insufficient information to code with codable children: Secondary | ICD-10-CM | POA: Diagnosis not present

## 2012-09-27 DIAGNOSIS — F411 Generalized anxiety disorder: Secondary | ICD-10-CM | POA: Insufficient documentation

## 2012-09-27 DIAGNOSIS — Z888 Allergy status to other drugs, medicaments and biological substances status: Secondary | ICD-10-CM | POA: Diagnosis not present

## 2012-09-27 DIAGNOSIS — M79609 Pain in unspecified limb: Secondary | ICD-10-CM | POA: Diagnosis not present

## 2012-09-27 DIAGNOSIS — L259 Unspecified contact dermatitis, unspecified cause: Secondary | ICD-10-CM | POA: Diagnosis not present

## 2012-09-27 DIAGNOSIS — T368X5A Adverse effect of other systemic antibiotics, initial encounter: Secondary | ICD-10-CM | POA: Insufficient documentation

## 2012-09-27 DIAGNOSIS — Z7982 Long term (current) use of aspirin: Secondary | ICD-10-CM | POA: Insufficient documentation

## 2012-09-27 DIAGNOSIS — I10 Essential (primary) hypertension: Secondary | ICD-10-CM | POA: Insufficient documentation

## 2012-09-27 DIAGNOSIS — R0609 Other forms of dyspnea: Secondary | ICD-10-CM | POA: Insufficient documentation

## 2012-09-27 DIAGNOSIS — R0989 Other specified symptoms and signs involving the circulatory and respiratory systems: Secondary | ICD-10-CM | POA: Insufficient documentation

## 2012-09-27 DIAGNOSIS — R220 Localized swelling, mass and lump, head: Secondary | ICD-10-CM | POA: Diagnosis not present

## 2012-09-27 HISTORY — DX: Nonrheumatic mitral (valve) prolapse: I34.1

## 2012-09-27 LAB — CBC WITH DIFFERENTIAL/PLATELET
Basophils Absolute: 0 10*3/uL (ref 0.0–0.1)
Basophils Relative: 1 % (ref 0–1)
Eosinophils Absolute: 0.3 10*3/uL (ref 0.0–0.7)
Eosinophils Relative: 5 % (ref 0–5)
HCT: 37 % (ref 36.0–46.0)
Hemoglobin: 12.2 g/dL (ref 12.0–15.0)
Lymphocytes Relative: 20 % (ref 12–46)
Lymphs Abs: 1.3 10*3/uL (ref 0.7–4.0)
MCH: 27.7 pg (ref 26.0–34.0)
MCHC: 33 g/dL (ref 30.0–36.0)
MCV: 83.9 fL (ref 78.0–100.0)
Monocytes Absolute: 0.5 10*3/uL (ref 0.1–1.0)
Monocytes Relative: 8 % (ref 3–12)
Neutro Abs: 4.1 10*3/uL (ref 1.7–7.7)
Neutrophils Relative %: 66 % (ref 43–77)
Platelets: 295 10*3/uL (ref 150–400)
RBC: 4.41 MIL/uL (ref 3.87–5.11)
RDW: 14.6 % (ref 11.5–15.5)
WBC: 6.2 10*3/uL (ref 4.0–10.5)

## 2012-09-27 NOTE — ED Provider Notes (Signed)
History     CSN: [id number]  Arrival date & time 09/27/12  1404   First MD Initiated Contact with Patient 09/27/12 1508      Chief Complaint  Patient presents with  . Allergic Reaction    (Consider location/radiation/quality/duration/timing/severity/associated sxs/prior treatment) HPI The patient presents with concerns of throat fullness, hoarseness, dyspnea.  The symptoms all began soon after taking a second dose of Avelox, approximately 8 hours prior to ED arrival.  During the severe phase of symptoms, the patient did not have syncope, chest pain, nausea, vomiting, fever, chills.  Symptoms began to resolve on their own, and on ED arrival the patient states that she is significantly improved, with no significant ongoing dyspnea, lightheadedness, and no chest pain, no nausea, no vomiting. She was taking Avelox to 2 concerns of a infection on the base of her right foot. She states that she recently had a podiatry procedure, with trimming of a callus on the base of the first metatarsal.  With increasing erythema she saw her physician on Friday, yesterday, had x-ray with concern for deep infection.  She specifically denies any current spreading erythema, any current distal dysesthesia or weakness, any current fever.  The patient notes that she has similar reactions to sulfa antibiotics, no history of intubation do to this, nor admission do to this.  Past Medical History  Diagnosis Date  . Neuropathy   . Hypertension   . Hyperlipidemia   . GERD (gastroesophageal reflux disease)   . Anxiety   . Mitral prolapse     Past Surgical History  Procedure Laterality Date  . Abdominal hysterectomy      No family history on file.  History  Substance Use Topics  . Smoking status: Never Smoker   . Smokeless tobacco: Not on file  . Alcohol Use: No    OB History   Grav Para Term Preterm Abortions TAB SAB Ect Mult Living                  Review of Systems  Constitutional:       Per  HPI, otherwise negative  HENT:       Per HPI, otherwise negative  Respiratory:       Per HPI, otherwise negative  Cardiovascular:       Per HPI, otherwise negative  Gastrointestinal: Negative for vomiting.  Endocrine:       Negative aside from HPI  Genitourinary:       Neg aside from HPI   Musculoskeletal:       Per HPI, otherwise negative  Skin: Negative.   Neurological: Negative for syncope.    Allergies  Codeine; Garlic; Latex; Onion; and Sulfa drugs cross reactors  Home Medications   Current Outpatient Rx  Name  Route  Sig  Dispense  Refill  . ALPRAZolam (XANAX) 0.25 MG tablet   Oral   Take 0.125 mg by mouth every other day.           Marland Kitchen aspirin EC 81 MG tablet   Oral   Take 81 mg by mouth daily.           Marland Kitchen atenolol (TENORMIN) 25 MG tablet   Oral   Take 12.5 mg by mouth 2 (two) times daily.           . benazepril (LOTENSIN) 40 MG tablet   Oral   Take 40 mg by mouth daily.           . Calcium Carbonate-Vitamin D (CALTRATE  600+D) 600-400 MG-UNIT per tablet   Oral   Take 1 tablet by mouth 2 (two) times daily.           . cholecalciferol (VITAMIN D) 1000 UNITS tablet   Oral   Take 1,000 Units by mouth daily.           . citalopram (CELEXA) 10 MG tablet   Oral   Take 10 mg by mouth daily.           . colestipol (COLESTID) 1 G tablet   Oral   Take 1 g by mouth 2 (two) times daily as needed. For diarrhea          . cycloSPORINE (RESTASIS) 0.05 % ophthalmic emulsion   Both Eyes   Place 1 drop into both eyes daily.           . diphenhydrAMINE (BENADRYL) 25 mg capsule   Oral   Take 25 mg by mouth at bedtime.           . fenofibrate (TRICOR) 145 MG tablet   Oral   Take 145 mg by mouth daily.           . flunisolide (NASALIDE) 0.025 % SOLN   Inhalation   Inhale 1 spray into the lungs daily.           Marland Kitchen gabapentin (NEURONTIN) 300 MG capsule   Oral   Take 300 mg by mouth 4 (four) times daily. Take 1 capsule three times a day and  take 3 capsules at bedtime           . hydrochlorothiazide (HYDRODIURIL) 25 MG tablet   Oral   Take 12.5 mg by mouth 2 (two) times daily.           Marland Kitchen HYDROcodone-acetaminophen (VICODIN) 5-500 MG per tablet   Oral   Take 1 tablet by mouth 4 (four) times daily.           . Lactobacillus (ACIDOPHILUS) 100 MG CAPS   Oral   Take 100 mg by mouth 2 (two) times daily.           . lansoprazole (PREVACID) 15 MG capsule   Oral   Take 15 mg by mouth daily.           . Multiple Vitamin (MULTIVITAMIN) tablet   Oral   Take 1 tablet by mouth daily.           . rosuvastatin (CRESTOR) 5 MG tablet   Oral   Take 2.5 mg by mouth 2 (two) times daily.             BP 166/89  Pulse 57  Temp(Src) 98.2 F (36.8 C) (Oral)  Resp 18  SpO2 98%  Physical Exam  Nursing note and vitals reviewed. Constitutional: She is oriented to person, place, and time. She appears well-developed and well-nourished. No distress.  HENT:  Head: Normocephalic and atraumatic.  Mouth/Throat: Uvula is midline, oropharynx is clear and moist and mucous membranes are normal.  Eyes: Conjunctivae and EOM are normal.  Cardiovascular: Normal rate and regular rhythm.   Pulmonary/Chest: Effort normal and breath sounds normal. No stridor. No respiratory distress.  Abdominal: She exhibits no distension.  Musculoskeletal: She exhibits no edema.       Feet:  Neurological: She is alert and oriented to person, place, and time. No cranial nerve deficit.  Skin: Skin is warm and dry.  Psychiatric: She has a normal mood and affect.    ED Course  Procedures (including critical care time)  Labs Reviewed  CBC WITH DIFFERENTIAL   No results found.   No diagnosis found.  02 99% room air normal   MDM  This pleasant elderly female presents several hours after an episode of dyspnea, throat fullness, with some concern for allergic reaction.  Given the passage of time since the onset of symptoms, the passage of the  superior face of the symptoms, the absence of any interventions taken, though her presentation is most consistent with a traction, there is low suspicion for acute progression.  Vital signs are stable.  The patient's wound looked generally benign, and absent fever, there is low suspicion for systemic infection or cellulitis or osteomyelitis.  The patient was advised to stop her hematocrit, follow up with her physician in the 36 hours, Monday morning for further evaluation and management.      Gerhard Munch, MD 09/27/12 [phone]

## 2012-09-27 NOTE — ED Notes (Signed)
Pt reports having an allergic reaction to Avalox. Pt states she has a history of allergic reactions to other antibiotics, specifically sulfa antibiotics. Pt reports tightening in her throat with difficulty swallowing and hoarseness. Pt has also been restless yesterday since starting the medication yesterday at dinner. Pt took another this morning at breakfast. Pt reports ringing in her ears. Throat visualized and no abnormalities within sight.

## 2012-09-29 DIAGNOSIS — T50995A Adverse effect of other drugs, medicaments and biological substances, initial encounter: Secondary | ICD-10-CM | POA: Diagnosis not present

## 2012-09-29 DIAGNOSIS — M79609 Pain in unspecified limb: Secondary | ICD-10-CM | POA: Diagnosis not present

## 2012-10-17 DIAGNOSIS — G9009 Other idiopathic peripheral autonomic neuropathy: Secondary | ICD-10-CM | POA: Diagnosis not present

## 2012-10-17 DIAGNOSIS — S91109A Unspecified open wound of unspecified toe(s) without damage to nail, initial encounter: Secondary | ICD-10-CM | POA: Diagnosis not present

## 2012-10-21 DIAGNOSIS — L821 Other seborrheic keratosis: Secondary | ICD-10-CM | POA: Diagnosis not present

## 2012-10-21 DIAGNOSIS — L723 Sebaceous cyst: Secondary | ICD-10-CM | POA: Diagnosis not present

## 2012-10-21 DIAGNOSIS — D235 Other benign neoplasm of skin of trunk: Secondary | ICD-10-CM | POA: Diagnosis not present

## 2012-10-29 DIAGNOSIS — G9009 Other idiopathic peripheral autonomic neuropathy: Secondary | ICD-10-CM | POA: Diagnosis not present

## 2012-10-29 DIAGNOSIS — S91109A Unspecified open wound of unspecified toe(s) without damage to nail, initial encounter: Secondary | ICD-10-CM | POA: Diagnosis not present

## 2012-11-08 ENCOUNTER — Other Ambulatory Visit: Payer: Self-pay | Admitting: Neurology

## 2012-11-10 ENCOUNTER — Encounter: Payer: Self-pay | Admitting: Neurology

## 2012-11-10 ENCOUNTER — Ambulatory Visit (INDEPENDENT_AMBULATORY_CARE_PROVIDER_SITE_OTHER): Payer: Medicare Other | Admitting: Neurology

## 2012-11-10 VITALS — BP 128/61 | HR 61 | Ht 62.0 in | Wt 136.0 lb

## 2012-11-10 DIAGNOSIS — G63 Polyneuropathy in diseases classified elsewhere: Secondary | ICD-10-CM | POA: Diagnosis not present

## 2012-11-10 DIAGNOSIS — R269 Unspecified abnormalities of gait and mobility: Secondary | ICD-10-CM | POA: Diagnosis not present

## 2012-11-10 NOTE — Progress Notes (Signed)
   Reason for visit: Peripheral neuropathy  [name] is an 79 y.o. female  History of present illness:  [name] is an 79 year old right-handed white female with a history of a small fiber neuropathy. The patient continues to be well-controlled on the gabapentin, and she is sleeping well at night. The patient does have some mild gait instability, but she has not had any falls and she has not required a cane or a walker. The patient recently had an infection of the right foot following bunion surgery, and this required antibiotic therapy. The patient developed an allergic reaction to Avelox. The patient is recovering from this infection at this time. The patient returns to this office for an evaluation.  ROS:  Out of a complete 14 system review of symptoms, the patient complains only of the following symptoms, and all other reviewed systems are negative.  Numbness Mild gait instability  Blood pressure 128/61, pulse 61, height 5\' 2"  (1.575 m), weight 136 lb (61.689 kg).  Physical Exam  General: The patient is alert and cooperative at the time of the examination.  Skin: No significant peripheral edema is noted.   Neurologic Exam  Cranial nerves: Facial symmetry is present. Speech is normal, no aphasia or dysarthria is noted. Extraocular movements are full. Visual fields are full.  Motor: The patient has good strength in all 4 extremities.  Coordination: The patient has good finger-nose-finger and heel-to-shin bilaterally.  Gait and station: The patient has a normal gait. Tandem gait is unsteady. Romberg is negative. No drift is seen.  Reflexes: Deep tendon reflexes are symmetric, but are depressed.   Assessment/Plan:  1. Peripheral neuropathy, small fiber  2. Mild gait instability  The patient will continue the gabapentin for now. The patient is doing well with this, and she is sleeping fairly well. The patient does not have a lot of pain during the daytime. The  patient will followup in 6-8 months.  Marlan Palau MD 11/10/2012 3:09 PM

## 2012-11-14 DIAGNOSIS — J3081 Allergic rhinitis due to animal (cat) (dog) hair and dander: Secondary | ICD-10-CM | POA: Diagnosis not present

## 2012-11-14 DIAGNOSIS — J45909 Unspecified asthma, uncomplicated: Secondary | ICD-10-CM | POA: Diagnosis not present

## 2012-11-14 DIAGNOSIS — J3089 Other allergic rhinitis: Secondary | ICD-10-CM | POA: Diagnosis not present

## 2012-12-04 DIAGNOSIS — M545 Low back pain, unspecified: Secondary | ICD-10-CM | POA: Diagnosis not present

## 2012-12-04 DIAGNOSIS — I10 Essential (primary) hypertension: Secondary | ICD-10-CM | POA: Diagnosis not present

## 2012-12-04 DIAGNOSIS — E785 Hyperlipidemia, unspecified: Secondary | ICD-10-CM | POA: Diagnosis not present

## 2012-12-17 DIAGNOSIS — I359 Nonrheumatic aortic valve disorder, unspecified: Secondary | ICD-10-CM | POA: Diagnosis not present

## 2012-12-17 DIAGNOSIS — I119 Hypertensive heart disease without heart failure: Secondary | ICD-10-CM | POA: Diagnosis not present

## 2012-12-17 DIAGNOSIS — I2789 Other specified pulmonary heart diseases: Secondary | ICD-10-CM | POA: Diagnosis not present

## 2012-12-17 DIAGNOSIS — E782 Mixed hyperlipidemia: Secondary | ICD-10-CM | POA: Diagnosis not present

## 2013-01-01 ENCOUNTER — Telehealth: Payer: Self-pay | Admitting: Cardiovascular Disease

## 2013-01-01 NOTE — Telephone Encounter (Signed)
Please call question about medicine-have 2 different milligram!

## 2013-01-01 NOTE — Telephone Encounter (Signed)
Spoke with patient concerning  Her amlodipine. She was confused about what milligram she should be taking, she has 2 different bottles 5mg  and 2.5 mg .Informed patient per Dr. Landry Dyke office note that she was started on amlodipine 2.5mg . At her last appointment. Told her that she can still use the 5mg  tablets, however she will ned to cut them into half. Patient expressed her understanding of the instructions.

## 2013-01-13 DIAGNOSIS — L570 Actinic keratosis: Secondary | ICD-10-CM | POA: Diagnosis not present

## 2013-01-13 DIAGNOSIS — L821 Other seborrheic keratosis: Secondary | ICD-10-CM | POA: Diagnosis not present

## 2013-01-22 DIAGNOSIS — Q6689 Other  specified congenital deformities of feet: Secondary | ICD-10-CM | POA: Diagnosis not present

## 2013-01-22 DIAGNOSIS — M2010 Hallux valgus (acquired), unspecified foot: Secondary | ICD-10-CM | POA: Diagnosis not present

## 2013-02-02 ENCOUNTER — Telehealth: Payer: Self-pay | Admitting: Cardiovascular Disease

## 2013-02-02 NOTE — Telephone Encounter (Signed)
Returned call.  Pt stated she changed pharmacies and the new one will not cut her atenolol pills in half for her.  Stated she is unable to do it with her arthritic hands and wants to know what to do.  Pt wants to know if Dr. Tresa Endo wants her to take one pill daily or if he wants to do something else.  Pt informed Dr. Tresa Endo will be notified for further instructions.

## 2013-02-02 NOTE — Telephone Encounter (Signed)
Please call-having problem with her Atenolol!

## 2013-02-04 ENCOUNTER — Telehealth: Payer: Self-pay | Admitting: *Deleted

## 2013-02-04 NOTE — Telephone Encounter (Signed)
Informed patient per Dr. Tresa Endo that he would prefer she try to cut into half, however if she doesn't fel that she can then okay to take 1 pill daily. Patient voiced her understanding.

## 2013-02-10 ENCOUNTER — Other Ambulatory Visit: Payer: Self-pay

## 2013-02-10 MED ORDER — BENAZEPRIL HCL 40 MG PO TABS: 40.0000 mg | ORAL_TABLET | Freq: Every day | ORAL | Status: DC

## 2013-02-10 NOTE — Telephone Encounter (Signed)
Rx was sent to pharmacy electronically. 

## 2013-02-12 ENCOUNTER — Other Ambulatory Visit: Payer: Self-pay | Admitting: *Deleted

## 2013-02-25 ENCOUNTER — Encounter: Payer: Self-pay | Admitting: *Deleted

## 2013-02-26 ENCOUNTER — Encounter: Payer: Self-pay | Admitting: Cardiovascular Disease

## 2013-02-27 ENCOUNTER — Ambulatory Visit: Payer: Medicare Other | Admitting: Cardiovascular Disease

## 2013-03-03 ENCOUNTER — Encounter: Payer: Self-pay | Admitting: Cardiovascular Disease

## 2013-03-03 ENCOUNTER — Ambulatory Visit (INDEPENDENT_AMBULATORY_CARE_PROVIDER_SITE_OTHER): Payer: Medicare Other | Admitting: Cardiovascular Disease

## 2013-03-03 VITALS — BP 118/60 | Ht 63.0 in | Wt 134.7 lb

## 2013-03-03 DIAGNOSIS — E785 Hyperlipidemia, unspecified: Secondary | ICD-10-CM | POA: Insufficient documentation

## 2013-03-03 DIAGNOSIS — I350 Nonrheumatic aortic (valve) stenosis: Secondary | ICD-10-CM | POA: Insufficient documentation

## 2013-03-03 DIAGNOSIS — E782 Mixed hyperlipidemia: Secondary | ICD-10-CM | POA: Insufficient documentation

## 2013-03-03 DIAGNOSIS — I272 Pulmonary hypertension, unspecified: Secondary | ICD-10-CM | POA: Insufficient documentation

## 2013-03-03 DIAGNOSIS — I10 Essential (primary) hypertension: Secondary | ICD-10-CM

## 2013-03-03 DIAGNOSIS — I119 Hypertensive heart disease without heart failure: Secondary | ICD-10-CM | POA: Diagnosis not present

## 2013-03-03 DIAGNOSIS — I2789 Other specified pulmonary heart diseases: Secondary | ICD-10-CM | POA: Diagnosis not present

## 2013-03-03 DIAGNOSIS — I359 Nonrheumatic aortic valve disorder, unspecified: Secondary | ICD-10-CM

## 2013-03-03 NOTE — Patient Instructions (Signed)
Your physician recommends that you schedule a follow-up appointment in: 6 months  

## 2013-03-03 NOTE — Progress Notes (Signed)
Patient ID: [name], female   DOB: [birth year], 79 y.o.   MRN: [id number]     HPI: [name], is a 79 y.o. female presents to the office today for a 3 month followup cardiology evaluation.  [name] is a very pleasant 79 year old female who has a history of hypertension, hyperlipidemia, as well as heart disease. Her last echo Doppler study done December 2013 showed normal systolic function with an ejection fraction of 55-60%. She had mild stenosis of her aortic valve, moderately severe calcified anulus of the mitral valve with moderate MR, moderately severely dilatation, as mom as well as mild-to-moderate RA dilatation. Was evidence for moderate tricuspid regurgitation and moderately severe pulmonary hypertension with PA pressure estimated at 66 mm.  I last saw [name], I started her on amlodipine 2.5 mg. She feels she is breathing better since initiating this therapy. She states her blood pressure is markedly improved. She denies palpitations. She denies chest pain. She denies presyncope or syncope.  Past Medical History  Diagnosis Date  . Hypertension   . Hyperlipidemia   . GERD (gastroesophageal reflux disease)   . Anxiety   . Mitral prolapse   . Degenerative arthritis   . Neuropathy   . Peripheral neuropathy     Small fiber   . Diverticulosis   . Mild renal insufficiency   . Hx of echocardiogram 07/30/2012    EF 55-60%; mild LVH; mild AV stenosis & trivial regurg.; mod MV regurg.; LA severly dilated; RV systolic pressure increased consistent with severe pulm. hypertension; RA moderately dilated; mod TV regurg.;   . History of nuclear stress test 09/19/2007    normal pattern of perfusion; post-stress EF 86%; EKG negative for ischemia; low risk scan  . History of Doppler ultrasound 07/30/2012    carotid doppler; normal study    Past Surgical History  Procedure Laterality Date  . Abdominal hysterectomy    . Replacement total knee Right   . Tear duct  probing with strabismus repair      Tear duct repair surgery  . Gallbladder resection    . Tonsillectomy    . Appendectomy    . Resuspension procedure    .  bilateral bunionectomies    . Foot fracture surgery Left   . Dilation and curettage of uterus    . Umbilical hernia repair      Allergies  Allergen Reactions  . Avelox (Moxifloxacin Hcl In Nacl) Shortness Of Breath and Swelling  . Codeine Swelling  . Garlic     Gi problems  . Latex     Watery blisters   . Onion     Gi problems  . Polysporin (Bacitracin-Polymyxin B)   . Sulfa Drugs Cross Reactors Swelling    Current Outpatient Prescriptions  Medication Sig Dispense Refill  . amLODipine (NORVASC) 2.5 MG tablet Take 2.5 mg by mouth daily.       Marland Kitchen aspirin EC 81 MG tablet Take 81 mg by mouth daily.        Marland Kitchen atenolol (TENORMIN) 25 MG tablet Take 12.5 mg by mouth 2 (two) times daily.        . benazepril (LOTENSIN) 40 MG tablet Take 1 tablet (40 mg total) by mouth daily.  30 tablet  10  . Calcium Carbonate-Vitamin D (CALTRATE 600+D) 600-400 MG-UNIT per tablet Take 1 tablet by mouth 2 (two) times daily.        . cholecalciferol (VITAMIN D) 1000 UNITS tablet Take 1,000 Units by mouth daily.        Marland Kitchen  colestipol (COLESTID) 1 G tablet Take 1 g by mouth 2 (two) times daily as needed. For diarrhea       . cycloSPORINE (RESTASIS) 0.05 % ophthalmic emulsion Place 1 drop into both eyes daily.        . diphenhydrAMINE (BENADRYL) 25 mg capsule Take 25 mg by mouth at bedtime.        . fenofibrate (TRICOR) 145 MG tablet Take 145 mg by mouth daily.        . flunisolide (NASALIDE) 0.025 % SOLN Inhale 1 spray into the lungs daily.        . fluticasone (FLONASE) 50 MCG/ACT nasal spray       . gabapentin (NEURONTIN) 300 MG capsule TAKE 1 CAPSULE BY MOUTH THREE TIMES DAILY, AND 3 CAPSULES AT BEDTIME  540 capsule  3  . hydrochlorothiazide (HYDRODIURIL) 25 MG tablet Take 12.5 mg by mouth 2 (two) times daily.        . hydrocodone-ibuprofen  (VICOPROFEN) 5-200 MG per tablet Take 1 tablet by mouth every 8 (eight) hours as needed for pain.      . Lactobacillus (ACIDOPHILUS) 100 MG CAPS Take 100 mg by mouth 2 (two) times daily.        . lansoprazole (PREVACID) 15 MG capsule Take 15 mg by mouth daily.        . Multiple Vitamin (MULTIVITAMIN) tablet Take 1 tablet by mouth daily.        . rosuvastatin (CRESTOR) 5 MG tablet Take 5 mg by mouth daily.        No current facility-administered medications for this visit.    Socially she is widowed. Has 2 children and 2 grandchildren. She does walk. Is no tobacco or alcohol use. [facility] She remains active.  ROS is negative for fevers, chills or night sweats.  For his glasses. She denies presyncope syncope. She denies wheezing. She denies chest pain. She denies bleeding. There is no nausea vomiting or diarrhea. Her shortness of breath has improved. There is no edema. Other system review is negative.  PE BP 118/60  Ht 5\' 3"  (1.6 m)  Wt 134 lb 11.2 oz (61.1 kg)  BMI 23.87 kg/m2  General: Alert, oriented, no distress.  Skin: normal turgor, no rashes HEENT: Normocephalic, atraumatic. Pupils round and reactive; sclera anicteric;no lid lag.  Nose without nasal septal hypertrophy Mouth/Parynx benign; Mallinpatti scale 2  Neck: No JVD, no carotid briuts Lungs: clear to ausculatation and percussion; no wheezing or rales Heart: RRR, s1 s2 normal; 2/6 systolic murmur in aortic region. No S3 gallop. Abdomen: soft, nontender; no hepatosplenomehaly, BS+; abdominal aorta nontender and not dilated by palpation. Pulses 2+ Extremities: no clubbing cyanosis or edema, Homan's sign negative  Neurologic: grossly nonfocal  ECG: Sinus rhythm at 54 beats per minute. QTc interval 403 ms.  LABS:  BMET    Component Value Date/Time   NA 140 04/02/2008 0405   K 3.6 04/02/2008 0405   CL 104 04/02/2008 0405   CO2 32 04/02/2008 0405   GLUCOSE 113* 04/02/2008 0405   BUN 8 04/02/2008 0405    CREATININE 0.82 04/02/2008 0405   CALCIUM 8.9 04/02/2008 0405   GFRNONAA >60 04/02/2008 0405   GFRAA  Value: >60        The eGFR has been calculated using the MDRD equation. This calculation has not been validated in all clinical 04/02/2008 0405     Hepatic Function Panel     Component Value Date/Time   PROT 7.3  03/24/2008 1045   ALBUMIN 4.2 03/24/2008 1045   AST 22 03/24/2008 1045   ALT 20 03/24/2008 1045   ALKPHOS 41 03/24/2008 1045   BILITOT 0.8 03/24/2008 1045     CBC    Component Value Date/Time   WBC 6.2 09/27/2012 1545   RBC 4.41 09/27/2012 1545   HGB 12.2 09/27/2012 1545   HCT 37.0 09/27/2012 1545   PLT 295 09/27/2012 1545   MCV 83.9 09/27/2012 1545   MCH 27.7 09/27/2012 1545   MCHC 33.0 09/27/2012 1545   RDW 14.6 09/27/2012 1545   LYMPHSABS 1.3 09/27/2012 1545   MONOABS 0.5 09/27/2012 1545   EOSABS 0.3 09/27/2012 1545   BASOSABS 0.0 09/27/2012 1545     BNP No results found for this basename: probnp    Lipid Panel  No results found for this basename: chol, trig, hdl, cholhdl, vldl, ldlcalc     RADIOLOGY: No results found.    ASSESSMENT AND PLAN: [name] feels improved with the addition of amlodipine 2.5 mg to her medical regimen her blood pressure is well controlled. She's not having any recent significant palpitations. She does have a mild peripheral neuropathy. Presently, recommend she continue her current medical regimen. She'll return to the primary care of Dr. Renne Crigler. See her in 6 months for cardiologic evaluation.     Lennette Bihari, MD, [facility]  03/03/2013 4:01 PM

## 2013-03-06 ENCOUNTER — Other Ambulatory Visit: Payer: Self-pay

## 2013-03-06 MED ORDER — BENAZEPRIL HCL 40 MG PO TABS: 40.0000 mg | ORAL_TABLET | Freq: Every day | ORAL | Status: DC

## 2013-03-06 NOTE — Telephone Encounter (Signed)
Rx was sent to pharmacy electronically. 

## 2013-03-11 ENCOUNTER — Telehealth: Payer: Self-pay | Admitting: Cardiovascular Disease

## 2013-03-11 NOTE — Telephone Encounter (Signed)
Returned call.  Busy x 2.  Message received and pharmacy change noted.

## 2013-03-11 NOTE — Telephone Encounter (Signed)
Just wanted you to know she have changed to  A ma[facility] order pharmacy-[facility] is the service-they wanted her to notify you of this change.

## 2013-03-12 ENCOUNTER — Other Ambulatory Visit: Payer: Self-pay | Admitting: *Deleted

## 2013-03-12 MED ORDER — AMLODIPINE BESYLATE 2.5 MG PO TABS: 2.5000 mg | ORAL_TABLET | Freq: Every day | ORAL | Status: DC

## 2013-03-16 ENCOUNTER — Other Ambulatory Visit: Payer: Self-pay

## 2013-03-16 ENCOUNTER — Other Ambulatory Visit: Payer: Self-pay | Admitting: *Deleted

## 2013-03-16 MED ORDER — GABAPENTIN 300 MG PO CAPS: ORAL_CAPSULE | ORAL | Status: DC

## 2013-04-08 DIAGNOSIS — M810 Age-related osteoporosis without current pathological fracture: Secondary | ICD-10-CM | POA: Diagnosis not present

## 2013-04-22 DIAGNOSIS — R197 Diarrhea, unspecified: Secondary | ICD-10-CM | POA: Diagnosis not present

## 2013-04-22 DIAGNOSIS — K921 Melena: Secondary | ICD-10-CM | POA: Diagnosis not present

## 2013-04-29 DIAGNOSIS — K921 Melena: Secondary | ICD-10-CM | POA: Diagnosis not present

## 2013-04-29 DIAGNOSIS — D62 Acute posthemorrhagic anemia: Secondary | ICD-10-CM | POA: Diagnosis not present

## 2013-04-29 DIAGNOSIS — D131 Benign neoplasm of stomach: Secondary | ICD-10-CM | POA: Diagnosis not present

## 2013-04-29 DIAGNOSIS — K222 Esophageal obstruction: Secondary | ICD-10-CM | POA: Diagnosis not present

## 2013-04-29 DIAGNOSIS — K449 Diaphragmatic hernia without obstruction or gangrene: Secondary | ICD-10-CM | POA: Diagnosis not present

## 2013-05-18 ENCOUNTER — Ambulatory Visit (INDEPENDENT_AMBULATORY_CARE_PROVIDER_SITE_OTHER): Payer: Medicare Other | Admitting: Neurology

## 2013-05-18 ENCOUNTER — Encounter: Payer: Self-pay | Admitting: Neurology

## 2013-05-18 VITALS — BP 143/71 | HR 66 | Wt 133.0 lb

## 2013-05-18 DIAGNOSIS — G63 Polyneuropathy in diseases classified elsewhere: Secondary | ICD-10-CM

## 2013-05-18 DIAGNOSIS — R269 Unspecified abnormalities of gait and mobility: Secondary | ICD-10-CM

## 2013-05-18 NOTE — Progress Notes (Signed)
Reason for visit: Peripheral neuropathy  [name] is an 79 y.o. female  History of present illness:  [name] is an 79 year old right-handed white female with a history of a peripheral neuropathy associated with a mild gait disturbance. The patient is doing quite well, and she indicates that she does not use a cane or a walker to get around. The patient reports no falls. The patient is on gabapentin, and this is at a dosing level that is helpful for her, and she is able to rest well at night. The patient does report some drowsiness during the day. The patient denies any new medical issues that have come up since last seen.  Past Medical History  Diagnosis Date  . Hypertension   . Hyperlipidemia   . GERD (gastroesophageal reflux disease)   . Anxiety   . Mitral prolapse   . Degenerative arthritis   . Neuropathy   . Peripheral neuropathy     Small fiber   . Diverticulosis   . Mild renal insufficiency   . Hx of echocardiogram 07/30/2012    EF 55-60%; mild LVH; mild AV stenosis & trivial regurg.; mod MV regurg.; LA severly dilated; RV systolic pressure increased consistent with severe pulm. hypertension; RA moderately dilated; mod TV regurg.;   . History of nuclear stress test 09/19/2007    normal pattern of perfusion; post-stress EF 86%; EKG negative for ischemia; low risk scan  . History of Doppler ultrasound 07/30/2012    carotid doppler; normal study  . Foot fracture, left     Past Surgical History  Procedure Laterality Date  . Abdominal hysterectomy    . Replacement total knee Right   . Tear duct probing with strabismus repair      Tear duct repair surgery  . Gallbladder resection    . Tonsillectomy    . Appendectomy    . Resuspension procedure    .  bilateral bunionectomies    . Foot fracture surgery Left   . Dilation and curettage of uterus    . Umbilical hernia repair      Family History  Problem Relation Age of Onset  . Pneumonia Mother   .  Coronary artery disease Mother   . Heart disease Father   . Cancer Father   . Arthritis Sister   . Hypertension Mother   . Hypertension Father     Social history:  reports that she has quit smoking. She does not have any smokeless tobacco history on file. She reports that she does not drink alcohol or use illicit drugs.    Allergies  Allergen Reactions  . Avelox [Moxifloxacin Hcl In Nacl] Shortness Of Breath and Swelling  . Codeine Swelling  . Garlic     Gi problems  . Latex     Watery blisters   . Onion     Gi problems  . Polysporin [Bacitracin-Polymyxin B]   . Sulfa Drugs Cross Reactors Swelling    Medications:  Current Outpatient Prescriptions on File Prior to Visit  Medication Sig Dispense Refill  . amLODipine (NORVASC) 2.5 MG tablet Take 1 tablet (2.5 mg total) by mouth daily.  90 tablet  3  . aspirin EC 81 MG tablet Take 81 mg by mouth daily.        Marland Kitchen atenolol (TENORMIN) 25 MG tablet Take 12.5 mg by mouth 2 (two) times daily.        . benazepril (LOTENSIN) 40 MG tablet Take 1 tablet (40 mg total) by mouth  daily.  90 tablet  3  . Calcium Carbonate-Vitamin D (CALTRATE 600+D) 600-400 MG-UNIT per tablet Take 1 tablet by mouth 2 (two) times daily.        . cholecalciferol (VITAMIN D) 1000 UNITS tablet Take 1,000 Units by mouth daily.        . colestipol (COLESTID) 1 G tablet Take 1 g by mouth 2 (two) times daily as needed. For diarrhea       . cycloSPORINE (RESTASIS) 0.05 % ophthalmic emulsion Place 1 drop into both eyes daily.        . diphenhydrAMINE (BENADRYL) 25 mg capsule Take 25 mg by mouth at bedtime.        . fenofibrate (TRICOR) 145 MG tablet Take 145 mg by mouth daily.        . flunisolide (NASALIDE) 0.025 % SOLN Inhale 1 spray into the lungs daily.        . fluticasone (FLONASE) 50 MCG/ACT nasal spray       . gabapentin (NEURONTIN) 300 MG capsule Take 1 capsule three times daily and take 3 capsules at bedtime  540 capsule  2  . hydrochlorothiazide (HYDRODIURIL) 25  MG tablet Take 12.5 mg by mouth 2 (two) times daily.        . hydrocodone-ibuprofen (VICOPROFEN) 5-200 MG per tablet Take 1 tablet by mouth every 8 (eight) hours as needed for pain.      . Lactobacillus (ACIDOPHILUS) 100 MG CAPS Take 100 mg by mouth 2 (two) times daily.        . lansoprazole (PREVACID) 15 MG capsule Take 15 mg by mouth daily.        . Multiple Vitamin (MULTIVITAMIN) tablet Take 1 tablet by mouth daily.        . rosuvastatin (CRESTOR) 5 MG tablet Take 5 mg by mouth daily.        No current facility-administered medications on file prior to visit.    ROS:  Out of a complete 14 system review of symptoms, the patient complains only of the following symptoms, and all other reviewed systems are negative.  Shortness of breath Diarrhea Feeling hot Allergies  Blood pressure 143/71, pulse 66, weight 133 lb (60.328 kg).  Physical Exam  General: The patient is alert and cooperative at the time of the examination.  Skin: No significant peripheral edema is noted.   Neurologic Exam  Cranial nerves: Facial symmetry is present. Speech is normal, no aphasia or dysarthria is noted. Extraocular movements are full. Visual fields are full.  Motor: The patient has good strength in all 4 extremities.  Coordination: The patient has good finger-nose-finger and heel-to-shin bilaterally.  Gait and station: The patient has a slightly wide-based, slightly unsteady gait. Tandem gait is was not attempted.  Romberg is negative. No drift is seen.  Reflexes: Deep tendon reflexes are symmetric, but are depressed.   Assessment/Plan:  One. Peripheral neuropathy  2. Gait disorder  The patient is doing quite well on her current dosing regimen. The patient will followup through this office in 9 or 10 months. The patient will contact me if needed.  Marlan Palau MD 05/18/2013 3:19 PM  [facility] [address] [place], [place] [address]  Phone  [phone] Fax [phone]

## 2013-05-20 DIAGNOSIS — K219 Gastro-esophageal reflux disease without esophagitis: Secondary | ICD-10-CM | POA: Diagnosis not present

## 2013-05-20 DIAGNOSIS — M899 Disorder of bone, unspecified: Secondary | ICD-10-CM | POA: Diagnosis not present

## 2013-05-20 DIAGNOSIS — Z0000 Encounter for general adult medical examination without abnormal findings: Secondary | ICD-10-CM | POA: Diagnosis not present

## 2013-05-20 DIAGNOSIS — Z23 Encounter for immunization: Secondary | ICD-10-CM | POA: Diagnosis not present

## 2013-05-20 DIAGNOSIS — I10 Essential (primary) hypertension: Secondary | ICD-10-CM | POA: Diagnosis not present

## 2013-05-20 DIAGNOSIS — D649 Anemia, unspecified: Secondary | ICD-10-CM | POA: Diagnosis not present

## 2013-05-20 DIAGNOSIS — Z79899 Other long term (current) drug therapy: Secondary | ICD-10-CM | POA: Diagnosis not present

## 2013-05-20 DIAGNOSIS — E785 Hyperlipidemia, unspecified: Secondary | ICD-10-CM | POA: Diagnosis not present

## 2013-05-27 DIAGNOSIS — M159 Polyosteoarthritis, unspecified: Secondary | ICD-10-CM | POA: Diagnosis not present

## 2013-05-27 DIAGNOSIS — G609 Hereditary and idiopathic neuropathy, unspecified: Secondary | ICD-10-CM | POA: Diagnosis not present

## 2013-05-27 DIAGNOSIS — K449 Diaphragmatic hernia without obstruction or gangrene: Secondary | ICD-10-CM | POA: Diagnosis not present

## 2013-05-27 DIAGNOSIS — I10 Essential (primary) hypertension: Secondary | ICD-10-CM | POA: Diagnosis not present

## 2013-05-28 ENCOUNTER — Other Ambulatory Visit: Payer: Self-pay | Admitting: Physician Assistant

## 2013-06-01 DIAGNOSIS — J309 Allergic rhinitis, unspecified: Secondary | ICD-10-CM | POA: Diagnosis not present

## 2013-06-02 ENCOUNTER — Other Ambulatory Visit: Payer: Self-pay | Admitting: Physician Assistant

## 2013-06-08 DIAGNOSIS — R197 Diarrhea, unspecified: Secondary | ICD-10-CM | POA: Diagnosis not present

## 2013-06-08 DIAGNOSIS — K921 Melena: Secondary | ICD-10-CM | POA: Diagnosis not present

## 2013-06-08 DIAGNOSIS — D500 Iron deficiency anemia secondary to blood loss (chronic): Secondary | ICD-10-CM | POA: Diagnosis not present

## 2013-06-15 DIAGNOSIS — D500 Iron deficiency anemia secondary to blood loss (chronic): Secondary | ICD-10-CM | POA: Diagnosis not present

## 2013-06-17 ENCOUNTER — Other Ambulatory Visit: Payer: Self-pay | Admitting: Physician Assistant

## 2013-06-18 NOTE — Telephone Encounter (Signed)
Patient returned call

## 2013-06-19 NOTE — Telephone Encounter (Signed)
Rx was sent to pharmacy electronically. 

## 2013-07-01 DIAGNOSIS — D509 Iron deficiency anemia, unspecified: Secondary | ICD-10-CM | POA: Diagnosis not present

## 2013-07-01 DIAGNOSIS — D649 Anemia, unspecified: Secondary | ICD-10-CM | POA: Diagnosis not present

## 2013-07-01 DIAGNOSIS — M159 Polyosteoarthritis, unspecified: Secondary | ICD-10-CM | POA: Diagnosis not present

## 2013-07-15 ENCOUNTER — Other Ambulatory Visit: Payer: Self-pay | Admitting: Gastroenterology

## 2013-07-15 DIAGNOSIS — K921 Melena: Secondary | ICD-10-CM

## 2013-07-15 DIAGNOSIS — R109 Unspecified abdominal pain: Secondary | ICD-10-CM

## 2013-07-15 DIAGNOSIS — D649 Anemia, unspecified: Secondary | ICD-10-CM

## 2013-07-20 DIAGNOSIS — R109 Unspecified abdominal pain: Secondary | ICD-10-CM | POA: Diagnosis not present

## 2013-07-20 DIAGNOSIS — D500 Iron deficiency anemia secondary to blood loss (chronic): Secondary | ICD-10-CM | POA: Diagnosis not present

## 2013-07-22 ENCOUNTER — Ambulatory Visit
Admission: RE | Admit: 2013-07-22 | Discharge: 2013-07-22 | Disposition: A | Discharge status: Home / Self Care | Payer: Medicare Other | Source: Ambulatory Visit | Attending: Gastroenterology | Admitting: Gastroenterology

## 2013-07-22 DIAGNOSIS — K449 Diaphragmatic hernia without obstruction or gangrene: Secondary | ICD-10-CM | POA: Diagnosis not present

## 2013-07-22 DIAGNOSIS — D649 Anemia, unspecified: Secondary | ICD-10-CM

## 2013-07-22 DIAGNOSIS — R109 Unspecified abdominal pain: Secondary | ICD-10-CM

## 2013-07-22 DIAGNOSIS — N838 Other noninflammatory disorders of ovary, fallopian tube and broad ligament: Secondary | ICD-10-CM | POA: Diagnosis not present

## 2013-07-22 DIAGNOSIS — K5730 Diverticulosis of large intestine without perforation or abscess without bleeding: Secondary | ICD-10-CM | POA: Diagnosis not present

## 2013-07-22 DIAGNOSIS — K921 Melena: Secondary | ICD-10-CM

## 2013-07-22 MED ORDER — IOHEXOL 300 MG/ML  SOLN
100.0000 mL | Freq: Once | INTRAMUSCULAR | Status: AC | PRN
  Administered 2013-07-22: 100 mL via INTRAVENOUS

## 2013-07-29 ENCOUNTER — Other Ambulatory Visit: Payer: Self-pay | Admitting: Internal Medicine

## 2013-07-29 DIAGNOSIS — D509 Iron deficiency anemia, unspecified: Secondary | ICD-10-CM | POA: Diagnosis not present

## 2013-07-29 DIAGNOSIS — R109 Unspecified abdominal pain: Secondary | ICD-10-CM | POA: Diagnosis not present

## 2013-07-29 DIAGNOSIS — N83209 Unspecified ovarian cyst, unspecified side: Secondary | ICD-10-CM

## 2013-07-29 DIAGNOSIS — R198 Other specified symptoms and signs involving the digestive system and abdomen: Secondary | ICD-10-CM | POA: Diagnosis not present

## 2013-07-29 DIAGNOSIS — Z780 Asymptomatic menopausal state: Secondary | ICD-10-CM | POA: Diagnosis not present

## 2013-08-04 DIAGNOSIS — L02619 Cutaneous abscess of unspecified foot: Secondary | ICD-10-CM | POA: Diagnosis not present

## 2013-08-04 DIAGNOSIS — L97509 Non-pressure chronic ulcer of other part of unspecified foot with unspecified severity: Secondary | ICD-10-CM | POA: Diagnosis not present

## 2013-08-10 ENCOUNTER — Ambulatory Visit
Admission: RE | Admit: 2013-08-10 | Discharge: 2013-08-10 | Disposition: A | Discharge status: Home / Self Care | Payer: Medicare Other | Source: Ambulatory Visit | Attending: Internal Medicine | Admitting: Internal Medicine

## 2013-08-10 DIAGNOSIS — N83209 Unspecified ovarian cyst, unspecified side: Secondary | ICD-10-CM

## 2013-08-10 MED ORDER — GADOBENATE DIMEGLUMINE 529 MG/ML IV SOLN
12.0000 mL | Freq: Once | INTRAVENOUS | Status: AC | PRN
  Administered 2013-08-10: 12 mL via INTRAVENOUS

## 2013-08-17 ENCOUNTER — Encounter: Payer: Self-pay | Admitting: Cardiology

## 2013-08-17 ENCOUNTER — Ambulatory Visit (INDEPENDENT_AMBULATORY_CARE_PROVIDER_SITE_OTHER): Payer: Medicare Other | Admitting: Cardiology

## 2013-08-17 ENCOUNTER — Telehealth: Payer: Self-pay | Admitting: *Deleted

## 2013-08-17 VITALS — BP 150/60 | HR 52 | Ht 62.0 in | Wt 133.2 lb

## 2013-08-17 DIAGNOSIS — I350 Nonrheumatic aortic (valve) stenosis: Secondary | ICD-10-CM

## 2013-08-17 DIAGNOSIS — I359 Nonrheumatic aortic valve disorder, unspecified: Secondary | ICD-10-CM | POA: Diagnosis not present

## 2013-08-17 DIAGNOSIS — I10 Essential (primary) hypertension: Secondary | ICD-10-CM

## 2013-08-17 DIAGNOSIS — I2789 Other specified pulmonary heart diseases: Secondary | ICD-10-CM | POA: Diagnosis not present

## 2013-08-17 DIAGNOSIS — I272 Pulmonary hypertension, unspecified: Secondary | ICD-10-CM

## 2013-08-17 MED ORDER — AMLODIPINE BESYLATE 5 MG PO TABS: 5.0000 mg | ORAL_TABLET | Freq: Every day | ORAL | Status: DC

## 2013-08-17 NOTE — Telephone Encounter (Signed)
Returned call and pt verified x 2.  Pt stated she had an episode on Christmas Eve w/ her BP and didn't see the nurse at the facility until Christmas Day.  Stated her BP was 18?/?? And the provider on call told the nurse to tell her to double the atenolol.  Stated she was taking 1/2 tab daily and is now taking a whole tab daily.  Pt wants to be seen.  BP check scheduled w/ Angie Boozer, NP for today at 2:45pm.  Pt advised to bring in a list or all pill bottles w/ her to appt.  Pt also advised to include abx she was rx'd for infection.  Pt verbalized understanding and agreed w/ plan.

## 2013-08-17 NOTE — Progress Notes (Signed)
08/17/2013   PCP: Londell Moh, MD   Chief Complaint  Patient presents with  . Hypertension    b/p 186 systolic at home. Denies c/p, sob, discomfort    Primary Cardiologist:Dr. Tresa Endo  HPI:  [name] is a very pleasant 79 year old female who has a history of hypertension, hyperlipidemia, as well as heart disease. Her last echo Doppler study done December 2013 showed normal systolic function with an ejection fraction of 55-60%. She had mild stenosis of her aortic valve, moderately severe calcified anulus of the mitral valve with moderate MR, moderately severely dilatation, as mom as well as mild-to-moderate RA dilatation. Was evidence for moderate tricuspid regurgitation and moderately severe pulmonary hypertension with PA pressure estimated at 66 mm.   Dr. Tresa Endo saw [name] and started her on amlodipine 2.5 mg. She feels she is breathing better since initiating this therapy. Her blood pressure was markedly improved.   She presents today due to elevated BP.  Episode on Christmas Eve of feeling very hot and headache.  The next day she had her BP checked and it was found to be 180 systolic.  She increased her atenolol to 25 mg BID and though improved it does continue to be elevated.  She is here for her BP.  She has no chest pain and only mild SOB with exertion.  Her BP today is elevated.   Allergies  Allergen Reactions  . Avelox [Moxifloxacin Hcl In Nacl] Shortness Of Breath and Swelling  . Codeine Swelling  . Garlic     Gi problems  . Latex     Watery blisters   . Onion     Gi problems  . Polysporin [Bacitracin-Polymyxin B]   . Sulfa Drugs Cross Reactors Swelling    Current Outpatient Prescriptions  Medication Sig Dispense Refill  . aspirin EC 81 MG tablet Take 81 mg by mouth daily.        Marland Kitchen atenolol (TENORMIN) 25 MG tablet Take 25 mg by mouth 2 (two) times daily.       . benazepril (LOTENSIN) 40 MG tablet Take 1 tablet (40 mg total) by mouth  daily.  90 tablet  3  . Calcium Carbonate-Vitamin D (CALTRATE 600+D) 600-400 MG-UNIT per tablet Take 1 tablet by mouth 2 (two) times daily.        . cholecalciferol (VITAMIN D) 1000 UNITS tablet Take 1,000 Units by mouth daily.        . colestipol (COLESTID) 1 G tablet Take 1 g by mouth 2 (two) times daily as needed. For diarrhea       . cycloSPORINE (RESTASIS) 0.05 % ophthalmic emulsion Place 1 drop into both eyes daily.        . diphenhydrAMINE (BENADRYL) 25 mg capsule Take 25 mg by mouth at bedtime.        . fenofibrate (TRICOR) 145 MG tablet Take 145 mg by mouth daily.        . flunisolide (NASALIDE) 0.025 % SOLN Inhale 1 spray into the lungs daily.        . fluticasone (FLONASE) 50 MCG/ACT nasal spray       . gabapentin (NEURONTIN) 300 MG capsule Take 1 capsule three times daily and take 3 capsules at bedtime  540 capsule  2  . hydrochlorothiazide (HYDRODIURIL) 25 MG tablet Take 12.5 mg by mouth 2 (two) times daily.        Marland Kitchen HYDROcodone-acetaminophen (NORCO/VICODIN) 5-325 MG per tablet Take 5-325 tablets  by mouth daily as needed.      . hydrocodone-ibuprofen (VICOPROFEN) 5-200 MG per tablet Take 1 tablet by mouth every 8 (eight) hours as needed for pain.      . Lactobacillus (ACIDOPHILUS) 100 MG CAPS Take 100 mg by mouth 2 (two) times daily.        . lansoprazole (PREVACID) 15 MG capsule Take 15 mg by mouth daily.        Marland Kitchen MELATONIN ER PO Take 1 tablet by mouth as needed.      . Multiple Vitamin (MULTIVITAMIN) tablet Take 1 tablet by mouth daily.        . rosuvastatin (CRESTOR) 5 MG tablet Take 5 mg by mouth daily.       Marland Kitchen amLODipine (NORVASC) 5 MG tablet Take 1 tablet (5 mg total) by mouth daily.  90 tablet  3   No current facility-administered medications for this visit.    Past Medical History  Diagnosis Date  . Hypertension   . Hyperlipidemia   . GERD (gastroesophageal reflux disease)   . Anxiety   . Mitral prolapse   . Degenerative arthritis   . Neuropathy   . Peripheral  neuropathy     Small fiber   . Diverticulosis   . Mild renal insufficiency   . Hx of echocardiogram 07/30/2012    EF 55-60%; mild LVH; mild AV stenosis & trivial regurg.; mod MV regurg.; LA severly dilated; RV systolic pressure increased consistent with severe pulm. hypertension; RA moderately dilated; mod TV regurg.;   . History of nuclear stress test 09/19/2007    normal pattern of perfusion; post-stress EF 86%; EKG negative for ischemia; low risk scan  . History of Doppler ultrasound 07/30/2012    carotid doppler; normal study  . Foot fracture, left     Past Surgical History  Procedure Laterality Date  . Abdominal hysterectomy    . Replacement total knee Right   . Tear duct probing with strabismus repair      Tear duct repair surgery  . Gallbladder resection    . Tonsillectomy    . Appendectomy    . Resuspension procedure    .  bilateral bunionectomies    . Foot fracture surgery Left   . Dilation and curettage of uterus    . Umbilical hernia repair      WUJ:WJXBJYN:WG colds or fevers- + allergies, mild drift downward with weight  Skin:no rashes or ulcers HEENT:no blurred vision, no congestion CV:see HPI PUL:see HPI GI:no diarrhea constipation or melena, no indigestion GU:no hematuria, no dysuria MS:no joint pain, no claudication Neuro:no syncope, no lightheadedness Endo:no diabetes, no thyroid disease Recently evaluated for pelvic mass that was found to be simple lt ovarian cyst.   PHYSICAL EXAM BP 150/60  Pulse 52  Ht 5\' 2"  (1.575 m)  Wt 133 lb 3.2 oz (60.419 kg)  BMI 24.36 kg/m2 General:Pleasant affect, NAD Skin:Warm and dry, brisk capillary refill HEENT:normocephalic, sclera clear, mucus membranes moist, mild hoarseness of voice Neck:supple, no JVD, no bruits  Heart:S1S2 RRR with 2/6 systolic murmur, no gallup, rub or click Lungs:clear without rales, rhonchi, +  Wheezes rt base NFA:OZHY, non tender, + BS, do not palpate liver spleen or masses Ext:no lower  ext edema, 2+ pedal pulses, 2+ radial pulses Neuro:alert and oriented, MAE, follows commands, + facial symmetry   ASSESSMENT AND PLAN Essential hypertension BP elevated and pt with H/A on Christmas eve and feeling hot.  I increased her amlodipine to 5 mg daily.  She  had already increased her atenolol to 25 mg BID.  She will follow up with Nurse at [facility] if BP still elevated greater than 140 she will call back to be seen.  Mild aortic valve stenosis stable  Moderate to severe pulmonary hypertension No chest pain, some mild SOB with exertion- may be due to HTN

## 2013-08-17 NOTE — Assessment & Plan Note (Signed)
BP elevated and pt with H/A on Christmas eve and feeling hot.  I increased her amlodipine to 5 mg daily.  She had already increased her atenolol to 25 mg BID.  She will follow up with Nurse at [facility] if BP still elevated greater than 140 she will call back to be seen.

## 2013-08-17 NOTE — Assessment & Plan Note (Signed)
stable °

## 2013-08-17 NOTE — Telephone Encounter (Signed)
Pt was told to call in this morning because she had an episode on Christmas Eve with her BP and was told to double her Atenonol and take her BP this morning it was 148/78. She stated that the nurse at [facility] called the doctor on call. She wants to know if she needs to continue to double her dose.  TK

## 2013-08-17 NOTE — Assessment & Plan Note (Signed)
No chest pain, some mild SOB with exertion- may be due to HTN

## 2013-08-17 NOTE — Patient Instructions (Signed)
Take 5 mg of amlodipine daily to control BP.  If BP still elevated next week- have the nurse at [facility] check or even on Friday of this week.  If still greater than 140 systolic then make an appointment to be seen.

## 2013-08-18 DIAGNOSIS — L97509 Non-pressure chronic ulcer of other part of unspecified foot with unspecified severity: Secondary | ICD-10-CM | POA: Diagnosis not present

## 2013-08-18 DIAGNOSIS — L02619 Cutaneous abscess of unspecified foot: Secondary | ICD-10-CM | POA: Diagnosis not present

## 2013-09-02 ENCOUNTER — Encounter: Payer: Self-pay | Admitting: Cardiovascular Disease

## 2013-09-02 ENCOUNTER — Ambulatory Visit (INDEPENDENT_AMBULATORY_CARE_PROVIDER_SITE_OTHER): Payer: Medicare Other | Admitting: Cardiovascular Disease

## 2013-09-02 VITALS — BP 142/72 | Ht 62.0 in | Wt 133.3 lb

## 2013-09-02 DIAGNOSIS — I272 Pulmonary hypertension, unspecified: Secondary | ICD-10-CM

## 2013-09-02 DIAGNOSIS — R001 Bradycardia, unspecified: Secondary | ICD-10-CM | POA: Insufficient documentation

## 2013-09-02 DIAGNOSIS — I359 Nonrheumatic aortic valve disorder, unspecified: Secondary | ICD-10-CM

## 2013-09-02 DIAGNOSIS — E785 Hyperlipidemia, unspecified: Secondary | ICD-10-CM

## 2013-09-02 DIAGNOSIS — I10 Essential (primary) hypertension: Secondary | ICD-10-CM

## 2013-09-02 DIAGNOSIS — I498 Other specified cardiac arrhythmias: Secondary | ICD-10-CM

## 2013-09-02 DIAGNOSIS — I2789 Other specified pulmonary heart diseases: Secondary | ICD-10-CM

## 2013-09-02 DIAGNOSIS — I350 Nonrheumatic aortic (valve) stenosis: Secondary | ICD-10-CM

## 2013-09-02 MED ORDER — ATENOLOL 25 MG PO TABS: ORAL_TABLET | ORAL | Status: DC

## 2013-09-02 MED ORDER — AMLODIPINE BESYLATE 5 MG PO TABS: 7.5000 mg | ORAL_TABLET | Freq: Every day | ORAL | Status: DC

## 2013-09-02 NOTE — Patient Instructions (Signed)
Dr Claiborne Billings has made the following medication changes:  Atenolol - take 1 tablet (25 mg total) in the morning and 1/2 tablet (12.5 mg total in the evening.  Amlodipine - take 1.5 tablets (7.5 mg) in the morning.  Dr Claiborne Billings wants you to follow-up in 3 months. You will receive a reminder letter in the mail two months in advance. If you don't receive a letter, please call our office to schedule the follow-up appointment.

## 2013-09-02 NOTE — Progress Notes (Signed)
Patient ID: [name], female   DOB: 12/11/1921, 80 y.o.   MRN: [id number]      HPI: [name], is a 80 y.o. female presents to the office today for followup cardiology evaluation.  [name] is a very pleasant 80-year-old female who has a history of hypertension, hyperlipidemia, as well as heart disease. Her last echo Doppler study done December 2013 showed normal systolic function with an ejection fraction of 55-60%. She had mild stenosis of her aortic valve, moderately severe calcified anulus of the mitral valve with moderate MR, moderately severely dilatation, as mom as well as mild-to-moderate RA dilatation. Was evidence for moderate tricuspid regurgitation and moderately severe pulmonary hypertension with PA pressure estimated at 66 mm. Last year, I started her on amlodipine at 2.5 mg.  She felt that she was breathing better since initiating this therapy and  her blood pressure had markedly improved. She denies palpitations. She denies chest pain. She denies presyncope or syncope. Apparently, she had called the office after having significant increased blood pressure around Christmas Eve. She subsequently was advised to increase her amlodipine from 2.5-5 mg and she had already increased her atenolol to 25 mg twice a day. She was seen by Laura Ingold on 08/17/2013. She now presents to the office today for followup cardiology evaluation. At home, she states her blood pressure has typically been running in the 140-150 range. She denies significant swelling. She denies recent chest pressure. She does have a history of peripheral neuropathy. She does have a history of GERD as well as hyperlipidemia.   Past Medical History  Diagnosis Date  . Hypertension   . Hyperlipidemia   . GERD (gastroesophageal reflux disease)   . Anxiety   . Mitral prolapse   . Degenerative arthritis   . Neuropathy   . Peripheral neuropathy     Small fiber   . Diverticulosis   . Mild renal  insufficiency   . Hx of echocardiogram 07/30/2012    EF 55-60%; mild LVH; mild AV stenosis & trivial regurg.; mod MV regurg.; LA severly dilated; RV systolic pressure increased consistent with severe pulm. hypertension; RA moderately dilated; mod TV regurg.;   . History of nuclear stress test 09/19/2007    normal pattern of perfusion; post-stress EF 86%; EKG negative for ischemia; low risk scan  . History of Doppler ultrasound 07/30/2012    carotid doppler; normal study  . Foot fracture, left     Past Surgical History  Procedure Laterality Date  . Abdominal hysterectomy    . Replacement total knee Right   . Tear duct probing with strabismus repair      Tear duct repair surgery  . Gallbladder resection    . Tonsillectomy    . Appendectomy    . Resuspension procedure    .  bilateral bunionectomies    . Foot fracture surgery Left   . Dilation and curettage of uterus    . Umbilical hernia repair      Allergies  Allergen Reactions  . Avelox [Moxifloxacin Hcl In Nacl] Shortness Of Breath and Swelling  . Codeine Swelling  . Garlic     Gi problems  . Latex     Watery blisters   . Onion     Gi problems  . Polysporin [Bacitracin-Polymyxin B]   . Sulfa Drugs Cross Reactors Swelling    Current Outpatient Prescriptions  Medication Sig Dispense Refill  . amLODipine (NORVASC) 5 MG tablet Take 1 tablet (5 mg total) by mouth   daily.  90 tablet  3  . aspirin EC 81 MG tablet Take 81 mg by mouth daily.        Marland Kitchen atenolol (TENORMIN) 25 MG tablet Take 25 mg by mouth 2 (two) times daily.       . benazepril (LOTENSIN) 40 MG tablet Take 1 tablet (40 mg total) by mouth daily.  90 tablet  3  . Calcium Carbonate-Vitamin D (CALTRATE 600+D) 600-400 MG-UNIT per tablet Take 1 tablet by mouth daily.       . cholecalciferol (VITAMIN D) 1000 UNITS tablet Take 1,000 Units by mouth daily.        . colestipol (COLESTID) 1 G tablet Take 1 g by mouth 2 (two) times daily as needed. For diarrhea       .  cycloSPORINE (RESTASIS) 0.05 % ophthalmic emulsion Place 1 drop into both eyes daily.        . diphenhydrAMINE (BENADRYL) 25 mg capsule Take 25 mg by mouth at bedtime.        . fenofibrate micronized (LOFIBRA) 134 MG capsule Take 1 capsule by mouth daily.      . flunisolide (NASALIDE) 0.025 % SOLN Inhale 1 spray into the lungs daily.        . fluticasone (FLONASE) 50 MCG/ACT nasal spray       . gabapentin (NEURONTIN) 300 MG capsule Take 1 capsule three times daily and take 3 capsules at bedtime  540 capsule  2  . hydrochlorothiazide (HYDRODIURIL) 25 MG tablet Take 12.5 mg by mouth 2 (two) times daily.        Marland Kitchen HYDROcodone-acetaminophen (NORCO/VICODIN) 5-325 MG per tablet Take 5-325 tablets by mouth daily as needed.      . hydrocodone-ibuprofen (VICOPROFEN) 5-200 MG per tablet Take 1 tablet by mouth every 8 (eight) hours as needed for pain.      . Lactobacillus (ACIDOPHILUS) 100 MG CAPS Take 100 mg by mouth 2 (two) times daily.        . lansoprazole (PREVACID) 15 MG capsule Take 15 mg by mouth daily.        Marland Kitchen MELATONIN ER PO Take 1 tablet by mouth as needed.      . Multiple Vitamin (MULTIVITAMIN) tablet Take 1 tablet by mouth daily.        . rosuvastatin (CRESTOR) 5 MG tablet Take 5 mg by mouth daily.        No current facility-administered medications for this visit.    Socially she is widowed. Has 2 children and 2 grandchildren. She does walk. Is no tobacco or alcohol use. [facility] She remains active.  ROS is negative for fevers, chills or night sweats.  she wears  glasses.  there is no change in hearing. She is unaware of lymphadenopathy. She denies cough or increased sputum production. She denies presyncope syncope. She denies wheezing. She denies chest pain. She denies bleeding. There is no nausea vomiting or diarrhea.  she denies blood in stool or urine. There is no claudication. Her shortness of breath has improved. There is no edema. she denies sleep issues. She denies  myalgias.  Other  appearance of 14 point system review is negative.  PE BP 142/72  Ht 5' 2" (1.575 m)  Wt 133 lb 4.8 oz (60.464 kg)  BMI 24.37 kg/m2  Repeat blood pressure 160/70 General: Alert, oriented, no distress.  Skin: normal turgor, no rashes HEENT: Normocephalic, atraumatic. Pupils round and reactive; sclera anicteric;no lid lag.  Nose without nasal septal  hypertrophy Mouth/Parynx benign; Mallinpatti scale 2  Neck: No JVD, no carotid briuts Chest wall: No tenderness to palpation Lungs: clear to ausculatation and percussion; no wheezing or rales Heart: RRR, s1 s2 normal; 2/6 systolic murmur in aortic region. No S3 gallop. Abdomen: soft, nontender; no hepatosplenomehaly, BS+; abdominal aorta nontender and not dilated by palpation. : No CVA tenderness  Pulses 2+ Extremities: no clubbing cyanosis or edema, Homan's sign negative  Neurologic: grossly nonfocal Psyhological: Normal affect and mood   ECG (independently read by me ): Sinus bradycardia at 51 beats per minute. Left axis deviation. No significant ST change. PR interval 160 ms. QTc interval 418 ms.   LABS:  BMET    Component Value Date/Time   NA 140 04/02/2008 0405   K 3.6 04/02/2008 0405   CL 104 04/02/2008 0405   CO2 32 04/02/2008 0405   GLUCOSE 113* 04/02/2008 0405   BUN 8 04/02/2008 0405   CREATININE 0.82 04/02/2008 0405   CALCIUM 8.9 04/02/2008 0405   GFRNONAA >60 04/02/2008 0405   GFRAA  Value: >60        The eGFR has been calculated using the MDRD equation. This calculation has not been validated in all clinical 04/02/2008 0405     Hepatic Function Panel     Component Value Date/Time   PROT 7.3 03/24/2008 1045   ALBUMIN 4.2 03/24/2008 1045   AST 22 03/24/2008 1045   ALT 20 03/24/2008 1045   ALKPHOS 41 03/24/2008 1045   BILITOT 0.8 03/24/2008 1045     CBC    Component Value Date/Time   WBC 6.2 09/27/2012 1545   RBC 4.41 09/27/2012 1545   HGB 12.2 09/27/2012 1545   HCT 37.0 09/27/2012 1545   PLT 295 09/27/2012 1545    MCV 83.9 09/27/2012 1545   MCH 27.7 09/27/2012 1545   MCHC 33.0 09/27/2012 1545   RDW 14.6 09/27/2012 1545   LYMPHSABS 1.3 09/27/2012 1545   MONOABS 0.5 09/27/2012 1545   EOSABS 0.3 09/27/2012 1545   BASOSABS 0.0 09/27/2012 1545     BNP No results found for this basename: probnp    Lipid Panel  No results found for this basename: chol,  trig,  hdl,  cholhdl,  vldl,  ldlcalc     RADIOLOGY: No results found.    ASSESSMENT AND PLAN: Ms. Tait has a history of hypertension, hyperlipidemia, documented normal systolic function with mild aortic stenosis, moderate mitral annular calcification with moderate MR, mild/moderate biatrial dilatation, moderate TR, as well as pulmonary hypertension. She recently developed increased blood pressure and had increased her atenolol to 25 twice a day as well as amlodipine to 5 mg. Presently, she is bradycardic at 51. I am recommending she change the atenolol to 25 mg in the morning and 12.5 mg at night. I further titrating her amlodipine from 5 mg to 7.5 mg for continued improved blood pressure control. Her blood pressure when taken by me was still elevated at 160/70. She is tolerating her lipid lowering therapy with combination treatment including fetal fibroid and Crestor. She's not having significant edema and does take her hydrochlorothiazide. She is not having GERD symptoms on current dose of Prevacid. I will see her back in the office in 3 months for followup evaluation.   Thomas A. Kelly, MD, FACC  09/02/2013 3:35 PM    

## 2013-09-03 DIAGNOSIS — K921 Melena: Secondary | ICD-10-CM | POA: Diagnosis not present

## 2013-09-03 DIAGNOSIS — D500 Iron deficiency anemia secondary to blood loss (chronic): Secondary | ICD-10-CM | POA: Diagnosis not present

## 2013-09-03 DIAGNOSIS — H43819 Vitreous degeneration, unspecified eye: Secondary | ICD-10-CM | POA: Diagnosis not present

## 2013-09-03 DIAGNOSIS — H40019 Open angle with borderline findings, low risk, unspecified eye: Secondary | ICD-10-CM | POA: Diagnosis not present

## 2013-09-03 DIAGNOSIS — H04129 Dry eye syndrome of unspecified lacrimal gland: Secondary | ICD-10-CM | POA: Diagnosis not present

## 2013-09-04 ENCOUNTER — Telehealth: Payer: Self-pay | Admitting: Neurology

## 2013-09-04 NOTE — Telephone Encounter (Signed)
Patient called stating that she would like to make a correction on her medications list from her visit in September 2014, patient states hydrocodone was listed twice and the second hydrocodone listed is incorrect. Patient wants this to be corrected.

## 2013-09-07 ENCOUNTER — Other Ambulatory Visit: Payer: Self-pay | Admitting: *Deleted

## 2013-09-10 ENCOUNTER — Telehealth: Payer: Self-pay | Admitting: Cardiovascular Disease

## 2013-09-10 DIAGNOSIS — L97509 Non-pressure chronic ulcer of other part of unspecified foot with unspecified severity: Secondary | ICD-10-CM | POA: Diagnosis not present

## 2013-09-10 NOTE — Telephone Encounter (Signed)
Please call-question about how to take her Atenolol.She is not sure if she is taking it right.

## 2013-09-10 NOTE — Telephone Encounter (Signed)
Returned call and pt verified x 2.  Pt wanted to clarify she is taking the atenolol right.  Stated the directions state to take a dose in the evening and she wanted to know if it's okay to take it w/ her evening meal.  Pt informed that is fine.  Advised she should be taking one whole tab in the AM and 1/2 tab in the evening.  Pt confirmed that is what she is taking.  No further action taken.

## 2013-09-15 DIAGNOSIS — D500 Iron deficiency anemia secondary to blood loss (chronic): Secondary | ICD-10-CM | POA: Diagnosis not present

## 2013-09-21 DIAGNOSIS — I10 Essential (primary) hypertension: Secondary | ICD-10-CM | POA: Diagnosis not present

## 2013-09-21 DIAGNOSIS — E785 Hyperlipidemia, unspecified: Secondary | ICD-10-CM | POA: Diagnosis not present

## 2013-09-21 DIAGNOSIS — M159 Polyosteoarthritis, unspecified: Secondary | ICD-10-CM | POA: Diagnosis not present

## 2013-09-21 DIAGNOSIS — R197 Diarrhea, unspecified: Secondary | ICD-10-CM | POA: Diagnosis not present

## 2013-10-07 DIAGNOSIS — M159 Polyosteoarthritis, unspecified: Secondary | ICD-10-CM | POA: Diagnosis not present

## 2013-11-03 ENCOUNTER — Encounter: Payer: Self-pay | Admitting: Cardiology

## 2013-11-03 ENCOUNTER — Ambulatory Visit (INDEPENDENT_AMBULATORY_CARE_PROVIDER_SITE_OTHER): Payer: Medicare Other | Admitting: Cardiology

## 2013-11-03 VITALS — BP 110/60 | HR 60 | Ht 62.0 in | Wt 137.1 lb

## 2013-11-03 DIAGNOSIS — R600 Localized edema: Secondary | ICD-10-CM | POA: Insufficient documentation

## 2013-11-03 DIAGNOSIS — I10 Essential (primary) hypertension: Secondary | ICD-10-CM

## 2013-11-03 DIAGNOSIS — R0989 Other specified symptoms and signs involving the circulatory and respiratory systems: Secondary | ICD-10-CM | POA: Diagnosis not present

## 2013-11-03 DIAGNOSIS — I38 Endocarditis, valve unspecified: Secondary | ICD-10-CM | POA: Diagnosis not present

## 2013-11-03 DIAGNOSIS — R0600 Dyspnea, unspecified: Secondary | ICD-10-CM | POA: Insufficient documentation

## 2013-11-03 DIAGNOSIS — R0609 Other forms of dyspnea: Secondary | ICD-10-CM | POA: Diagnosis not present

## 2013-11-03 DIAGNOSIS — R609 Edema, unspecified: Secondary | ICD-10-CM | POA: Diagnosis not present

## 2013-11-03 NOTE — Assessment & Plan Note (Signed)
This was her primary reason for OV today though not very impressive on exam.

## 2013-11-03 NOTE — Progress Notes (Signed)
11/03/2013 [name]   [birth year]  [id number]  Primary Physicia Horatio Pel, MD Primary Cardiologist: Dr Claiborne Billings  HPI:  [name] 80 y/o who lives at [facility], followed by Dr Claiborne Billings with a history of HTN and mild valvular heart disease by echo. She is in the office today with complaints of lower extremity edema which is new for her. She admits her legs are not that swollen today but that they have been worse. She denies any orthopnea but admits to new dyspnea with exertion- walking to the dinning room at [facility].   Current Outpatient Prescriptions  Medication Sig Dispense Refill  . amLODipine (NORVASC) 5 MG tablet Take 1.5 tablets (7.5 mg total) by mouth daily.  45 tablet  11  . aspirin EC 81 MG tablet Take 81 mg by mouth daily.        [name] atenolol (TENORMIN) 25 MG tablet Take 1 tablet (25 mg total) by mouth in the morning. Take 1/2 tablet (12.5 mg total) by mouth in the evening.  45 tablet  11  . benazepril (LOTENSIN) 40 MG tablet Take 1 tablet (40 mg total) by mouth daily.  90 tablet  3  . Calcium Carbonate-Vitamin D (CALTRATE 600+D) 600-400 MG-UNIT per tablet Take 1 tablet by mouth daily.       . cholecalciferol (VITAMIN D) 1000 UNITS tablet Take 1,000 Units by mouth daily.        . colestipol (COLESTID) 1 G tablet Take 1 g by mouth 2 (two) times daily as needed. For diarrhea       . cycloSPORINE (RESTASIS) 0.05 % ophthalmic emulsion Place 1 drop into both eyes daily.        . diphenhydrAMINE (BENADRYL) 25 mg capsule Take 25 mg by mouth at bedtime.        . fenofibrate micronized (LOFIBRA) 134 MG capsule Take 1 capsule by mouth daily.      . flunisolide (NASALIDE) 0.025 % SOLN Inhale 1 spray into the lungs daily.        . fluticasone (FLONASE) 50 MCG/ACT nasal spray       . gabapentin (NEURONTIN) 300 MG capsule Take 1 capsule three times daily and take 3 capsules at bedtime  540 capsule  2  . hydrochlorothiazide (HYDRODIURIL) 25 MG tablet Take 12.5 mg by mouth 2  (two) times daily.        [name] HYDROcodone-acetaminophen (NORCO/VICODIN) 5-325 MG per tablet Take 5-325 tablets by mouth daily as needed.      . Lactobacillus (ACIDOPHILUS) 100 MG CAPS Take 100 mg by mouth 2 (two) times daily.        . lansoprazole (PREVACID) 15 MG capsule Take 15 mg by mouth daily.        . Multiple Vitamin (MULTIVITAMIN) tablet Take 1 tablet by mouth daily.        . rosuvastatin (CRESTOR) 5 MG tablet Take 5 mg by mouth daily.        No current facility-administered medications for this visit.    Allergies  Allergen Reactions  . Avelox [Moxifloxacin Hcl In Nacl] Shortness Of Breath and Swelling  . Codeine Swelling  . Garlic     Gi problems  . Latex     Watery blisters   . Onion     Gi problems  . Polysporin [Bacitracin-Polymyxin B]   . Sulfa Drugs Cross Reactors Swelling    History   Social History  . Marital Status: Married    Spouse Name: N/A  Number of Children: N/A  . Years of Education: N/A   Occupational History  . Not on file.   Social History Main Topics  . Smoking status: Former Research scientist (life sciences)  . Smokeless tobacco: Not on file  . Alcohol Use: No  . Drug Use: No  . Sexual Activity: No   Other Topics Concern  . Not on file   Social History Narrative  . No narrative on file     Review of Systems: General: negative for chills, fever, night sweats or weight changes.  Cardiovascular: negative for chest pain, dyspnea on exertion, edema, orthopnea, palpitations, paroxysmal nocturnal dyspnea or shortness of breath Dermatological: negative for rash Respiratory: negative for cough or wheezing Urologic: negative for hematuria Abdominal: negative for nausea, vomiting, diarrhea, bright red blood per rectum, melena, or hematemesis Neurologic: negative for visual changes, syncope, or dizziness All other systems reviewed and are otherwise negative except as noted above.    Blood pressure 110/60, pulse 60, height 5\' 2"  (1.575 m), weight 137 lb 1.6 oz  (62.188 kg).  General appearance: alert, cooperative and no distress Lungs: clear to auscultation bilaterally Heart: regular rate and rhythm and soft sytolic murmur, + S2 Extremities: trace edema at ankles   ASSESSMENT AND PLAN:   Dyspnea on exertion She has noticed increased dyspnea with exertion  Edema of both legs This was her primary reason for OV today though not very impressive on exam.  Valvular heart disease Mild AS. Moderate MR, pulm HTN of her echo in 2013  Essential hypertension Controlled   PLAN  Check echo, continue current medical Rx. Consider stopping Norvasc pending echo results. She has an appointment n with Dr Claiborne Billings in 3 weeks.   [name] KPA-C 11/03/2013 2:27 PM

## 2013-11-03 NOTE — Assessment & Plan Note (Signed)
Controlled.  

## 2013-11-03 NOTE — Assessment & Plan Note (Signed)
Mild AS. Moderate MR, pulm HTN of her echo in 2013

## 2013-11-03 NOTE — Assessment & Plan Note (Signed)
She has noticed increased dyspnea with exertion

## 2013-11-03 NOTE — Patient Instructions (Signed)
Your physician has requested that you have an echocardiogram. Echocardiography is a painless test that uses sound waves to create images of your heart. It provides your doctor with information about the size and shape of your heart and how well your heart's chambers and valves are working. This procedure takes approximately one hour. There are no restrictions for this procedure. Keep follow up with Dr Claiborne Billings as scheduled

## 2013-11-04 ENCOUNTER — Telehealth: Payer: Self-pay | Admitting: Cardiovascular Disease

## 2013-11-04 MED ORDER — FENOFIBRATE MICRONIZED 134 MG PO CAPS: 134.0000 mg | ORAL_CAPSULE | Freq: Every day | ORAL | Status: DC

## 2013-11-04 NOTE — Telephone Encounter (Signed)
Returned call and no answer.  Will send refill for fenofibrate to [facility].  Pt will need to discuss refills at appt or call back w/ names and dosages for each med needing refilled for future refills.  Refill(s) sent to pharmacy.

## 2013-11-04 NOTE — Telephone Encounter (Signed)
Wants to change all her medicine to  [facility].Their fax [phone]  This time she need her  Fenosibrate 134 mg #90.

## 2013-11-12 DIAGNOSIS — L97509 Non-pressure chronic ulcer of other part of unspecified foot with unspecified severity: Secondary | ICD-10-CM | POA: Diagnosis not present

## 2013-11-16 ENCOUNTER — Ambulatory Visit (HOSPITAL_COMMUNITY)
Admission: RE | Admit: 2013-11-16 | Discharge: 2013-11-16 | Disposition: A | Discharge status: Home / Self Care | Payer: Medicare Other | Source: Ambulatory Visit | Attending: Cardiovascular Disease | Admitting: Cardiovascular Disease

## 2013-11-16 DIAGNOSIS — R0600 Dyspnea, unspecified: Secondary | ICD-10-CM

## 2013-11-16 DIAGNOSIS — I369 Nonrheumatic tricuspid valve disorder, unspecified: Secondary | ICD-10-CM | POA: Diagnosis not present

## 2013-11-16 DIAGNOSIS — R0609 Other forms of dyspnea: Secondary | ICD-10-CM | POA: Diagnosis not present

## 2013-11-16 DIAGNOSIS — R0989 Other specified symptoms and signs involving the circulatory and respiratory systems: Secondary | ICD-10-CM | POA: Insufficient documentation

## 2013-11-16 NOTE — Progress Notes (Signed)
  Echocardiogram 2D Echocardiogram has been performed.  Desarie Feild, [place] 11/16/2013, 1:39 PM

## 2013-11-25 DIAGNOSIS — J45909 Unspecified asthma, uncomplicated: Secondary | ICD-10-CM | POA: Diagnosis not present

## 2013-11-25 DIAGNOSIS — I10 Essential (primary) hypertension: Secondary | ICD-10-CM | POA: Diagnosis not present

## 2013-11-25 DIAGNOSIS — M159 Polyosteoarthritis, unspecified: Secondary | ICD-10-CM | POA: Diagnosis not present

## 2013-11-25 DIAGNOSIS — E785 Hyperlipidemia, unspecified: Secondary | ICD-10-CM | POA: Diagnosis not present

## 2013-11-26 DIAGNOSIS — J45909 Unspecified asthma, uncomplicated: Secondary | ICD-10-CM | POA: Diagnosis not present

## 2013-11-26 DIAGNOSIS — M159 Polyosteoarthritis, unspecified: Secondary | ICD-10-CM | POA: Diagnosis not present

## 2013-11-26 DIAGNOSIS — E785 Hyperlipidemia, unspecified: Secondary | ICD-10-CM | POA: Diagnosis not present

## 2013-11-26 DIAGNOSIS — I10 Essential (primary) hypertension: Secondary | ICD-10-CM | POA: Diagnosis not present

## 2013-12-10 ENCOUNTER — Ambulatory Visit (INDEPENDENT_AMBULATORY_CARE_PROVIDER_SITE_OTHER): Payer: Medicare Other | Admitting: Cardiovascular Disease

## 2013-12-10 ENCOUNTER — Encounter: Payer: Self-pay | Admitting: Cardiovascular Disease

## 2013-12-10 VITALS — BP 132/64 | HR 50 | Ht 63.0 in | Wt 133.8 lb

## 2013-12-10 DIAGNOSIS — I38 Endocarditis, valve unspecified: Secondary | ICD-10-CM

## 2013-12-10 DIAGNOSIS — E785 Hyperlipidemia, unspecified: Secondary | ICD-10-CM

## 2013-12-10 DIAGNOSIS — R0989 Other specified symptoms and signs involving the circulatory and respiratory systems: Secondary | ICD-10-CM

## 2013-12-10 DIAGNOSIS — I059 Rheumatic mitral valve disease, unspecified: Secondary | ICD-10-CM

## 2013-12-10 DIAGNOSIS — I071 Rheumatic tricuspid insufficiency: Secondary | ICD-10-CM | POA: Insufficient documentation

## 2013-12-10 DIAGNOSIS — R001 Bradycardia, unspecified: Secondary | ICD-10-CM

## 2013-12-10 DIAGNOSIS — I2789 Other specified pulmonary heart diseases: Secondary | ICD-10-CM

## 2013-12-10 DIAGNOSIS — I498 Other specified cardiac arrhythmias: Secondary | ICD-10-CM | POA: Diagnosis not present

## 2013-12-10 DIAGNOSIS — R0609 Other forms of dyspnea: Secondary | ICD-10-CM | POA: Diagnosis not present

## 2013-12-10 DIAGNOSIS — I10 Essential (primary) hypertension: Secondary | ICD-10-CM

## 2013-12-10 DIAGNOSIS — I340 Nonrheumatic mitral (valve) insufficiency: Secondary | ICD-10-CM | POA: Insufficient documentation

## 2013-12-10 DIAGNOSIS — R0600 Dyspnea, unspecified: Secondary | ICD-10-CM

## 2013-12-10 DIAGNOSIS — I272 Pulmonary hypertension, unspecified: Secondary | ICD-10-CM | POA: Insufficient documentation

## 2013-12-10 DIAGNOSIS — R609 Edema, unspecified: Secondary | ICD-10-CM

## 2013-12-10 DIAGNOSIS — R600 Localized edema: Secondary | ICD-10-CM

## 2013-12-10 DIAGNOSIS — I079 Rheumatic tricuspid valve disease, unspecified: Secondary | ICD-10-CM

## 2013-12-10 NOTE — Progress Notes (Signed)
Patient ID: [name], female   DOB: [birth year], 80 y.o.   MRN: [id number]      HPI: [name] is a 80 y.o. female presents to the office today for followup cardiology evaluation.  [name] has a history of hypertension, hyperlipidemia, as well as valvular heart disease. An echo Doppler study in December 2013 showed normal systolic function with an ejection fraction of 55-60%. She had mild stenosis of her aortic valve, moderately severe calcified anulus of the mitral valve with moderate MR, moderately severe LA dilatation,  mild-to-moderate RA dilatation, moderate tricuspid regurgitation and moderately severe pulmonary hypertension with PA pressure estimated at 66 mm. Last year, I started her on amlodipine at 2.5 mg.  She felt that she was breathing better since initiating this therapy and  her blood pressure had markedly improved. She denies palpitations. She denies chest pain. She denies presyncope or syncope. Apparently, she had called the office after having significant increased blood pressure around Christmas Eve. She subsequently was advised to increase her amlodipine from 2.5-5 mg and she had already increased her atenolol to 25 mg twice a day. She was seen by Cecilie Kicks on 08/17/2013 and last saw me in January 2015.  Recently, [name] dose of amlodipine was reduced back to 2.5 mg to 2 ankle edema.  She also has been taking furosemide 20 mg daily.  She did undergo a followup echo Doppler study on 11/16/2013.  This showed normal systolic function with an ejection fraction at 60-65%.  Diastolic parameters were normal.  She did have mitral annular calcification with moderate mitral regurgitation.  The left atrium was moderately dilated, the right atrium was moderately dilated, and her tricuspid stenosis was now considered severe.  Pulmonary pressures were slightly reduced from previously, but were still elevated at 61 mm.  Clinically, [name] feels well.  She denies  significant shortness of breath.  Her ankle swelling has improved with a reduction in the amlodipine dose and with the diuretic regimen.  She denies chest pressure.  She is unaware of palpitations.  She presents for evaluation.  Past Medical History  Diagnosis Date  . Hypertension   . Hyperlipidemia   . GERD (gastroesophageal reflux disease)   . Anxiety   . Mitral prolapse   . Degenerative arthritis   . Neuropathy   . Peripheral neuropathy     Small fiber   . Diverticulosis   . Mild renal insufficiency   . Hx of echocardiogram 07/30/2012    EF 55-60%; mild LVH; mild AV stenosis & trivial regurg.; mod MV regurg.; LA severly dilated; RV systolic pressure increased consistent with severe pulm. hypertension; RA moderately dilated; mod TV regurg.;   . History of nuclear stress test 09/19/2007    normal pattern of perfusion; post-stress EF 86%; EKG negative for ischemia; low risk scan  . History of Doppler ultrasound 07/30/2012    carotid doppler; normal study  . Foot fracture, left     Past Surgical History  Procedure Laterality Date  . Abdominal hysterectomy    . Replacement total knee Right   . Tear duct probing with strabismus repair      Tear duct repair surgery  . Gallbladder resection    . Tonsillectomy    . Appendectomy    . Resuspension procedure    .  bilateral bunionectomies    . Foot fracture surgery Left   . Dilation and curettage of uterus    . Umbilical hernia repair  Allergies  Allergen Reactions  . Avelox [Moxifloxacin Hcl In Nacl] Shortness Of Breath and Swelling  . Codeine Swelling  . Garlic     Gi problems  . Latex     Watery blisters   . Onion     Gi problems  . Polysporin [Bacitracin-Polymyxin B]   . Sulfa Drugs Cross Reactors Swelling    Current Outpatient Prescriptions  Medication Sig Dispense Refill  . aspirin EC 81 MG tablet Take 81 mg by mouth daily.        Marland Kitchen atenolol (TENORMIN) 25 MG tablet Take 1 tablet (25 mg total) by mouth in the  morning. Take 1/2 tablet (12.5 mg total) by mouth in the evening.  45 tablet  11  . benazepril (LOTENSIN) 40 MG tablet Take 1 tablet (40 mg total) by mouth daily.  90 tablet  3  . Calcium Carbonate-Vitamin D (CALTRATE 600+D) 600-400 MG-UNIT per tablet Take 1 tablet by mouth daily.       . cholecalciferol (VITAMIN D) 1000 UNITS tablet Take 1,000 Units by mouth daily.        . colestipol (COLESTID) 1 G tablet Take 1 g by mouth 2 (two) times daily as needed. For diarrhea       . cycloSPORINE (RESTASIS) 0.05 % ophthalmic emulsion Place 1 drop into both eyes daily.        . diphenhydrAMINE (BENADRYL) 25 mg capsule Take 25 mg by mouth at bedtime.        . fenofibrate micronized (LOFIBRA) 134 MG capsule Take 1 capsule (134 mg total) by mouth daily.  90 capsule  0  . flunisolide (NASALIDE) 0.025 % SOLN Inhale 1 spray into the lungs daily.        . fluticasone (FLONASE) 50 MCG/ACT nasal spray       . gabapentin (NEURONTIN) 300 MG capsule Take 1 capsule three times daily and take 3 capsules at bedtime  540 capsule  2  . hydrochlorothiazide (HYDRODIURIL) 25 MG tablet Take 12.5 mg by mouth 2 (two) times daily.        Marland Kitchen HYDROcodone-acetaminophen (NORCO/VICODIN) 5-325 MG per tablet Take 5-325 tablets by mouth daily as needed.      . Lactobacillus (ACIDOPHILUS) 100 MG CAPS Take 100 mg by mouth 2 (two) times daily.        . lansoprazole (PREVACID) 15 MG capsule Take 15 mg by mouth daily.        . Multiple Vitamin (MULTIVITAMIN) tablet Take 1 tablet by mouth daily.        . rosuvastatin (CRESTOR) 5 MG tablet Take 5 mg by mouth daily.       Marland Kitchen amLODipine (NORVASC) 5 MG tablet Take by mouth daily. Takes 1/2 tablet       No current facility-administered medications for this visit.    Socially she is widowed. Has 2 children and 2 grandchildren. She does walk. Is no tobacco or alcohol use. She resides at [facility]. She remains active.  ROS is negative for fevers, chills or night sweats.  She wears  glasses.  No  change in hearing. She is unaware of lymphadenopathy. She denies cough or increased sputum production. She denies presyncope syncope. She denies wheezing. She denies chest pain. She denies bleeding. There is no nausea vomiting or diarrhea.  she denies blood in stool or urine. There is no claudication. Her shortness of breath has improved.  Her ankle edema has improved peer she denies sleep issues. She denies myalgias.  There is no  diabetes.  She denies cold or heat intolerance.  Other  comprehensive 14 point system review is negative.  PE BP 132/64  Pulse 50  Ht _0  (1.6 m)  Wt 133 lb 12.8 oz (60.691 kg)  BMI 23.71 kg/m2  Repeat blood pressure 160/70 General: Alert, oriented, no distress.  Skin: normal turgor, no rashes HEENT: Normocephalic, atraumatic. Pupils round and reactive; sclera anicteric;no lid lag.  Nose without nasal septal hypertrophy Mouth/Parynx benign; Mallinpatti scale 2  Neck: No JVD, possible trace right carotid bruit; normal carotid upstroke Chest wall: No tenderness to palpation Lungs: clear to ausculatation and percussion; no wheezing or rales Heart: RRR, s1 s2 normal; 2/6 systolic murmur in aortic region. No S3 gallop.  No rubs, thrills or heaves.  No apparent jugular reflux. Abdomen: soft, nontender; no hepatosplenomehaly, BS+; abdominal aorta nontender and not dilated by palpation. : No CVA tenderness  Pulses 2+ Extremities: Trivial residual ankle edema. no clubbing cyanosis , Homan's sign negative  Neurologic: grossly nonfocal Psyhological: Normal affect and mood   ECG today (independently by me) sinus bradycardia 50 beats per minute.  No ectopy.  Normal intervals.  No ST segment changes.  Prior 09/02/2013 ECG (independently read by me ): Sinus bradycardia at 51 beats per minute. Left axis deviation. No significant ST change. PR interval 160 ms. QTc interval 418 ms.   LABS:  BMET    Component Value Date/Time   NA 140 04/02/2008 0405   K 3.6 04/02/2008 0405    CL 104 04/02/2008 0405   CO2 32 04/02/2008 0405   GLUCOSE 113* 04/02/2008 0405   BUN 8 04/02/2008 0405   CREATININE 0.82 04/02/2008 0405   CALCIUM 8.9 04/02/2008 0405   GFRNONAA >60 04/02/2008 0405   GFRAA  Value: >60        The eGFR has been calculated using the MDRD equation. This calculation has not been validated in all clinical 04/02/2008 0405     Hepatic Function Panel     Component Value Date/Time   PROT 7.3 03/24/2008 1045   ALBUMIN 4.2 03/24/2008 1045   AST 22 03/24/2008 1045   ALT 20 03/24/2008 1045   ALKPHOS 41 03/24/2008 1045   BILITOT 0.8 03/24/2008 1045     CBC    Component Value Date/Time   WBC 6.2 09/27/2012 1545   RBC 4.41 09/27/2012 1545   HGB 12.2 09/27/2012 1545   HCT 37.0 09/27/2012 1545   PLT 295 09/27/2012 1545   MCV 83.9 09/27/2012 1545   MCH 27.7 09/27/2012 1545   MCHC 33.0 09/27/2012 1545   RDW 14.6 09/27/2012 1545   LYMPHSABS 1.3 09/27/2012 1545   MONOABS 0.5 09/27/2012 1545   EOSABS 0.3 09/27/2012 1545   BASOSABS 0.0 09/27/2012 1545     BNP No results found for this basename: probnp    Lipid Panel  No results found for this basename: chol,  trig,  hdl,  cholhdl,  vldl,  ldlcalc     RADIOLOGY: No results found.    ASSESSMENT AND PLAN: Ms. Glore has a history of hypertension, hyperlipidemia, and has documented normal systolic function with normal diastolic parameters.  The review her most recent echo Doppler study and compare this to her prior evaluation.  There is no mention of previously noted mild aortic stenosis on the present study.  She does have mitral annular calcification with moderate mitral regurgitation.  Her tricuspid regurgitation was no recorded as severe per pulmonary pressures remain elevated at 61 mm, but this is decreased from  66 mm and her last study.  She's not having any significant heart failure symptoms or suggestion of right-sided heart failure.  She does have trace ankle edema, but this has improved with reduction of amlodipine dose to 2.5 mg.  Her  blood pressure today continues to be stable on her regimen, consisting of amlodipine 2.5 mg, HCTZ 12.5 mg, but has a paternal 40 mg, and atenolol, which he takes 25 mg in the morning and 12.5 mg at night.  She is on fenofibrate, and Crestor for hyperlipidemia , and seems to tolerate this well.  We'll need to check laboratory from Dr. Denita Lung office for most recent investigation.  Clinically, she is stable.  I will see her in 6 months for reassessment and further recommendations admit that time.  She will continue to monitor her blood pressure recordings at home.  Troy Sine, MD, [facility]  12/10/2013 3:11 PM

## 2013-12-10 NOTE — Patient Instructions (Signed)
Your physician recommends that you schedule a follow-up appointment in: 6 months. No changes were made today in your therapy. 

## 2013-12-15 ENCOUNTER — Other Ambulatory Visit: Payer: Self-pay

## 2013-12-15 DIAGNOSIS — L570 Actinic keratosis: Secondary | ICD-10-CM | POA: Diagnosis not present

## 2013-12-15 DIAGNOSIS — L723 Sebaceous cyst: Secondary | ICD-10-CM | POA: Diagnosis not present

## 2013-12-15 DIAGNOSIS — D0430 Carcinoma in situ of skin of unspecified part of face: Secondary | ICD-10-CM | POA: Diagnosis not present

## 2013-12-15 DIAGNOSIS — Z85828 Personal history of other malignant neoplasm of skin: Secondary | ICD-10-CM | POA: Diagnosis not present

## 2013-12-15 DIAGNOSIS — D485 Neoplasm of uncertain behavior of skin: Secondary | ICD-10-CM | POA: Diagnosis not present

## 2013-12-15 DIAGNOSIS — D044 Carcinoma in situ of skin of scalp and neck: Secondary | ICD-10-CM | POA: Diagnosis not present

## 2013-12-15 DIAGNOSIS — L259 Unspecified contact dermatitis, unspecified cause: Secondary | ICD-10-CM | POA: Diagnosis not present

## 2013-12-15 DIAGNOSIS — D0439 Carcinoma in situ of skin of other parts of face: Secondary | ICD-10-CM | POA: Diagnosis not present

## 2013-12-15 DIAGNOSIS — L821 Other seborrheic keratosis: Secondary | ICD-10-CM | POA: Diagnosis not present

## 2013-12-17 DIAGNOSIS — M545 Low back pain, unspecified: Secondary | ICD-10-CM | POA: Diagnosis not present

## 2013-12-17 DIAGNOSIS — E785 Hyperlipidemia, unspecified: Secondary | ICD-10-CM | POA: Diagnosis not present

## 2013-12-17 DIAGNOSIS — I2789 Other specified pulmonary heart diseases: Secondary | ICD-10-CM | POA: Diagnosis not present

## 2013-12-17 DIAGNOSIS — I10 Essential (primary) hypertension: Secondary | ICD-10-CM | POA: Diagnosis not present

## 2013-12-21 ENCOUNTER — Other Ambulatory Visit: Payer: Self-pay | Admitting: Allergy and Immunology

## 2013-12-21 ENCOUNTER — Ambulatory Visit
Admission: RE | Admit: 2013-12-21 | Discharge: 2013-12-21 | Disposition: A | Discharge status: Home / Self Care | Payer: Medicare Other | Source: Ambulatory Visit | Attending: Allergy and Immunology | Admitting: Allergy and Immunology

## 2013-12-21 DIAGNOSIS — J3089 Other allergic rhinitis: Secondary | ICD-10-CM | POA: Diagnosis not present

## 2013-12-21 DIAGNOSIS — J019 Acute sinusitis, unspecified: Secondary | ICD-10-CM | POA: Diagnosis not present

## 2013-12-21 DIAGNOSIS — J45901 Unspecified asthma with (acute) exacerbation: Secondary | ICD-10-CM

## 2013-12-21 DIAGNOSIS — J45909 Unspecified asthma, uncomplicated: Secondary | ICD-10-CM | POA: Diagnosis not present

## 2013-12-31 DIAGNOSIS — L97509 Non-pressure chronic ulcer of other part of unspecified foot with unspecified severity: Secondary | ICD-10-CM | POA: Diagnosis not present

## 2014-01-01 ENCOUNTER — Other Ambulatory Visit: Payer: Self-pay

## 2014-01-01 MED ORDER — FENOFIBRATE MICRONIZED 134 MG PO CAPS: 134.0000 mg | ORAL_CAPSULE | Freq: Every day | ORAL | Status: DC

## 2014-01-01 NOTE — Telephone Encounter (Signed)
Rx was sent to pharmacy electronically. 

## 2014-01-07 DIAGNOSIS — E785 Hyperlipidemia, unspecified: Secondary | ICD-10-CM | POA: Diagnosis not present

## 2014-01-07 DIAGNOSIS — I10 Essential (primary) hypertension: Secondary | ICD-10-CM | POA: Diagnosis not present

## 2014-01-07 DIAGNOSIS — M159 Polyosteoarthritis, unspecified: Secondary | ICD-10-CM | POA: Diagnosis not present

## 2014-01-14 DIAGNOSIS — L97509 Non-pressure chronic ulcer of other part of unspecified foot with unspecified severity: Secondary | ICD-10-CM | POA: Diagnosis not present

## 2014-01-19 DIAGNOSIS — R269 Unspecified abnormalities of gait and mobility: Secondary | ICD-10-CM | POA: Diagnosis not present

## 2014-01-19 DIAGNOSIS — M129 Arthropathy, unspecified: Secondary | ICD-10-CM | POA: Diagnosis not present

## 2014-01-27 DIAGNOSIS — M159 Polyosteoarthritis, unspecified: Secondary | ICD-10-CM | POA: Diagnosis not present

## 2014-01-27 DIAGNOSIS — I10 Essential (primary) hypertension: Secondary | ICD-10-CM | POA: Diagnosis not present

## 2014-02-23 DIAGNOSIS — L570 Actinic keratosis: Secondary | ICD-10-CM | POA: Diagnosis not present

## 2014-02-23 DIAGNOSIS — I8310 Varicose veins of unspecified lower extremity with inflammation: Secondary | ICD-10-CM | POA: Diagnosis not present

## 2014-02-23 DIAGNOSIS — Z85828 Personal history of other malignant neoplasm of skin: Secondary | ICD-10-CM | POA: Diagnosis not present

## 2014-03-01 DIAGNOSIS — B351 Tinea unguium: Secondary | ICD-10-CM | POA: Diagnosis not present

## 2014-03-01 DIAGNOSIS — L97509 Non-pressure chronic ulcer of other part of unspecified foot with unspecified severity: Secondary | ICD-10-CM | POA: Diagnosis not present

## 2014-03-01 DIAGNOSIS — M79609 Pain in unspecified limb: Secondary | ICD-10-CM | POA: Diagnosis not present

## 2014-03-02 ENCOUNTER — Telehealth: Payer: Self-pay | Admitting: Cardiovascular Disease

## 2014-03-02 MED ORDER — ATENOLOL 25 MG PO TABS: ORAL_TABLET | ORAL | Status: DC

## 2014-03-02 MED ORDER — HYDROCHLOROTHIAZIDE 25 MG PO TABS: 12.5000 mg | ORAL_TABLET | Freq: Two times a day (BID) | ORAL | Status: DC

## 2014-03-02 NOTE — Telephone Encounter (Signed)
Please call,she need to talk to you about her Atenolol and Hydrothiazide. She need the correct directions for her Atenolol.

## 2014-03-02 NOTE — Telephone Encounter (Signed)
Patient would like prescription sent to mail order  Atenolol and  HCTZ TO [facility]  #90 day supply x3 She wanted to confirm dosage of atenolol Per note 25 mg in morning (1 tablet) and 1/2 tablet in the evening. Patient verbalized understanding  E -sent presricptions

## 2014-03-05 ENCOUNTER — Encounter: Payer: Self-pay | Admitting: Neurology

## 2014-03-05 ENCOUNTER — Ambulatory Visit (INDEPENDENT_AMBULATORY_CARE_PROVIDER_SITE_OTHER): Payer: Medicare Other | Admitting: Neurology

## 2014-03-05 ENCOUNTER — Encounter (INDEPENDENT_AMBULATORY_CARE_PROVIDER_SITE_OTHER): Payer: Self-pay

## 2014-03-05 VITALS — BP 153/68 | HR 53 | Wt 133.0 lb

## 2014-03-05 DIAGNOSIS — D518 Other vitamin B12 deficiency anemias: Secondary | ICD-10-CM | POA: Diagnosis not present

## 2014-03-05 DIAGNOSIS — R269 Unspecified abnormalities of gait and mobility: Secondary | ICD-10-CM | POA: Diagnosis not present

## 2014-03-05 DIAGNOSIS — G63 Polyneuropathy in diseases classified elsewhere: Secondary | ICD-10-CM | POA: Diagnosis not present

## 2014-03-05 DIAGNOSIS — R413 Other amnesia: Secondary | ICD-10-CM | POA: Diagnosis not present

## 2014-03-05 DIAGNOSIS — R6889 Other general symptoms and signs: Secondary | ICD-10-CM | POA: Diagnosis not present

## 2014-03-05 DIAGNOSIS — D513 Other dietary vitamin B12 deficiency anemia: Secondary | ICD-10-CM

## 2014-03-05 DIAGNOSIS — F0150 Vascular dementia without behavioral disturbance: Secondary | ICD-10-CM | POA: Insufficient documentation

## 2014-03-05 HISTORY — DX: Other amnesia: R41.3

## 2014-03-05 NOTE — Patient Instructions (Signed)

## 2014-03-05 NOTE — Progress Notes (Signed)
Reason for visit: Peripheral neuropathy  [name] is an 80 y.o. female  History of present illness:  [name] is an 80 year old right-handed white female with a history of a peripheral neuropathy. She has been taking gabapentin with good benefit taking 300 mg 3 times during the day, and 900 mg at night. She is resting fairly well at nighttime, but she has had some increased pain since she has had some issues with a bunion on her right foot. She reports some spasm in the right foot. She is followed by a podiatrist for this. She comes in today somewhat concerned about her memory. She indicates that over the last several months he has noted some difficulty with short-term memory, and some word finding problems. The patient is still operating a motor vehicle without problems, and she denies any issues managing her finances or her medications. She lives alone with independent living. She comes to this office for an evaluation.  Past Medical History  Diagnosis Date  . Hypertension   . Hyperlipidemia   . GERD (gastroesophageal reflux disease)   . Anxiety   . Mitral prolapse   . Degenerative arthritis   . Neuropathy   . Peripheral neuropathy     Small fiber   . Diverticulosis   . Mild renal insufficiency   . Hx of echocardiogram 07/30/2012    EF 55-60%; mild LVH; mild AV stenosis & trivial regurg.; mod MV regurg.; LA severly dilated; RV systolic pressure increased consistent with severe pulm. hypertension; RA moderately dilated; mod TV regurg.;   . History of nuclear stress test 09/19/2007    normal pattern of perfusion; post-stress EF 86%; EKG negative for ischemia; low risk scan  . History of Doppler ultrasound 07/30/2012    carotid doppler; normal study  . Foot fracture, left   . Memory disorder 03/05/2014    Past Surgical History  Procedure Laterality Date  . Abdominal hysterectomy    . Replacement total knee Right   . Tear duct probing with strabismus repair     Tear duct repair surgery  . Gallbladder resection    . Tonsillectomy    . Appendectomy    . Resuspension procedure    .  bilateral bunionectomies    . Foot fracture surgery Left   . Dilation and curettage of uterus    . Umbilical hernia repair      Family History  Problem Relation Age of Onset  . Pneumonia Mother   . Coronary artery disease Mother   . Heart disease Father   . Cancer Father   . Arthritis Sister   . Hypertension Mother   . Hypertension Father     Social history:  reports that she has quit smoking. She has never used smokeless tobacco. She reports that she does not drink alcohol or use illicit drugs.    Allergies  Allergen Reactions  . Avelox [Moxifloxacin Hcl In Nacl] Shortness Of Breath and Swelling  . Codeine Swelling  . Garlic     Gi problems  . Latex     Watery blisters   . Onion     Gi problems  . Polysporin [Bacitracin-Polymyxin B]   . Sulfa Drugs Cross Reactors Swelling    Medications:  Current Outpatient Prescriptions on File Prior to Visit  Medication Sig Dispense Refill  . amLODipine (NORVASC) 5 MG tablet Take by mouth daily. Takes 1/2 tablet      . aspirin EC 81 MG tablet Take 81 mg by mouth  daily.        . atenolol (TENORMIN) 25 MG tablet Take 1 tablet (25 mg total) by mouth in the morning. Take 1/2 tablet (12.5 mg total) by mouth in the evening.  135 tablet  3  . benazepril (LOTENSIN) 40 MG tablet Take 1 tablet (40 mg total) by mouth daily.  90 tablet  3  . Calcium Carbonate-Vitamin D (CALTRATE 600+D) 600-400 MG-UNIT per tablet Take 1 tablet by mouth daily.       . cholecalciferol (VITAMIN D) 1000 UNITS tablet Take 1,000 Units by mouth daily.        . colestipol (COLESTID) 1 G tablet Take 1 g by mouth 2 (two) times daily as needed. For diarrhea       . cycloSPORINE (RESTASIS) 0.05 % ophthalmic emulsion Place 1 drop into both eyes daily.        . diphenhydrAMINE (BENADRYL) 25 mg capsule Take 25 mg by mouth at bedtime.        . fenofibrate  micronized (LOFIBRA) 134 MG capsule Take 1 capsule (134 mg total) by mouth daily.  90 capsule  3  . flunisolide (NASALIDE) 0.025 % SOLN Inhale 1 spray into the lungs daily.        . fluticasone (FLONASE) 50 MCG/ACT nasal spray       . gabapentin (NEURONTIN) 300 MG capsule Take 1 capsule three times daily and take 3 capsules at bedtime  540 capsule  2  . hydrochlorothiazide (HYDRODIURIL) 25 MG tablet Take 0.5 tablets (12.5 mg total) by mouth 2 (two) times daily.  90 tablet  3  . HYDROcodone-acetaminophen (NORCO/VICODIN) 5-325 MG per tablet Take 5-325 tablets by mouth daily as needed.      . Lactobacillus (ACIDOPHILUS) 100 MG CAPS Take 100 mg by mouth 2 (two) times daily.        . lansoprazole (PREVACID) 15 MG capsule Take 15 mg by mouth daily.        . Multiple Vitamin (MULTIVITAMIN) tablet Take 1 tablet by mouth daily.        . rosuvastatin (CRESTOR) 5 MG tablet Take 5 mg by mouth daily.        No current facility-administered medications on file prior to visit.    ROS:  Out of a complete 14 system review of symptoms, the patient complains only of the following symptoms, and all other reviewed systems are negative.  Cough Diarrhea Environmental and food allergies Joint pain, back pain, achy muscles, muscle cramps Skin rash, memory loss Facial drooping  Blood pressure 153/68, pulse 53, weight 133 lb (60.328 kg).  Physical Exam  General: The patient is alert and cooperative at the time of the examination.  Skin: No significant peripheral edema is noted.   Neurologic Exam  Mental status: The patient is oriented x 3. Mini-Mental status examination done today shows a total score of 27/30.  Cranial nerves: Facial symmetry is present. Speech is normal, no aphasia or dysarthria is noted. Extraocular movements are full. Visual fields are full.  Motor: The patient has good strength in all 4 extremities.  Sensory examination: Soft touch sensation is symmetric on the face, arms, and  legs.  Coordination: The patient has good finger-nose-finger and heel-to-shin bilaterally.  Gait and station: The patient has a wide-based, slightly unsteady gait. Tandem gait is slightly unsteady. The patient does not use an assistive device for ambulation. Romberg is negative. No drift is seen.  Reflexes: Deep tendon reflexes are symmetric, but are depressed throughout.   Assessment/Plan:  1. Peripheral neuropathy  2. Reported memory disturbance  The patient will be worked up for the memory issues. She will have blood work today, and a MRI of the brain. She has had a prior MRI the brain done in 2007 that showed minimal small vessel disease. The patient may be an adequate candidate for medication such as Aricept for memory. She will followup in 6 months.  Jill Alexanders MD 03/07/2014 9:52 AM  [facility] [address] [place], [place] [address]  Phone [phone] Fax [phone]

## 2014-03-06 LAB — TSH: TSH: 0.154 u[IU]/mL — ABNORMAL LOW (ref 0.450–4.500)

## 2014-03-06 LAB — COPPER, SERUM: Copper: 98 ug/dL (ref 72–166)

## 2014-03-06 LAB — VITAMIN B12: Vitamin B-12: >1999 pg/mL — ABNORMAL HIGH (ref 211–946)

## 2014-03-07 ENCOUNTER — Telehealth: Payer: Self-pay | Admitting: Neurology

## 2014-03-07 NOTE — Telephone Encounter (Signed)
I called the patient. The TSH was low. The patient may have hyperthyroidism. I have sent the blood to the primary MD. She will call next week.

## 2014-03-11 DIAGNOSIS — R7989 Other specified abnormal findings of blood chemistry: Secondary | ICD-10-CM | POA: Diagnosis not present

## 2014-03-11 DIAGNOSIS — I10 Essential (primary) hypertension: Secondary | ICD-10-CM | POA: Diagnosis not present

## 2014-03-11 DIAGNOSIS — E785 Hyperlipidemia, unspecified: Secondary | ICD-10-CM | POA: Diagnosis not present

## 2014-03-15 ENCOUNTER — Other Ambulatory Visit: Payer: Self-pay

## 2014-03-15 ENCOUNTER — Telehealth: Payer: Self-pay | Admitting: Neurology

## 2014-03-15 MED ORDER — GABAPENTIN 300 MG PO CAPS: ORAL_CAPSULE | ORAL | Status: DC

## 2014-03-15 NOTE — Telephone Encounter (Signed)
Explained to patient that her bloodwork was sent to her PCP. Her thyroid was low and she is to follow up with PCP. She asked about MRI explained prior authorization process. Patient verbalized understanding.

## 2014-03-15 NOTE — Telephone Encounter (Signed)
Patient calling and questioning what Dr. Jannifer Franklin plans to do after blood work?  Please call and advise.

## 2014-03-23 ENCOUNTER — Telehealth: Payer: Self-pay | Admitting: Cardiovascular Disease

## 2014-03-25 NOTE — Telephone Encounter (Signed)
Closed encounter °

## 2014-03-25 NOTE — Telephone Encounter (Signed)
Noted  

## 2014-04-12 DIAGNOSIS — I10 Essential (primary) hypertension: Secondary | ICD-10-CM | POA: Diagnosis not present

## 2014-04-12 DIAGNOSIS — M159 Polyosteoarthritis, unspecified: Secondary | ICD-10-CM | POA: Diagnosis not present

## 2014-04-12 DIAGNOSIS — R7989 Other specified abnormal findings of blood chemistry: Secondary | ICD-10-CM | POA: Diagnosis not present

## 2014-04-12 DIAGNOSIS — E785 Hyperlipidemia, unspecified: Secondary | ICD-10-CM | POA: Diagnosis not present

## 2014-04-13 ENCOUNTER — Telehealth: Payer: Self-pay | Admitting: Cardiovascular Disease

## 2014-04-13 NOTE — Telephone Encounter (Signed)
Returning your call. °

## 2014-04-13 NOTE — Telephone Encounter (Signed)
Left message according to our chart patient should be taking 1/2 of the 5 mg amlodipine unless she has been seen by someone since her last visit with [place]. Call if addtional questions.

## 2014-04-13 NOTE — Telephone Encounter (Signed)
Please call, question about how to take Amlodipine.If not there just leave a message and she will return the call.

## 2014-04-15 DIAGNOSIS — L97509 Non-pressure chronic ulcer of other part of unspecified foot with unspecified severity: Secondary | ICD-10-CM | POA: Diagnosis not present

## 2014-05-13 DIAGNOSIS — L97509 Non-pressure chronic ulcer of other part of unspecified foot with unspecified severity: Secondary | ICD-10-CM | POA: Diagnosis not present

## 2014-05-19 DIAGNOSIS — Z85828 Personal history of other malignant neoplasm of skin: Secondary | ICD-10-CM | POA: Diagnosis not present

## 2014-05-19 DIAGNOSIS — L821 Other seborrheic keratosis: Secondary | ICD-10-CM | POA: Diagnosis not present

## 2014-05-19 DIAGNOSIS — L723 Sebaceous cyst: Secondary | ICD-10-CM | POA: Diagnosis not present

## 2014-05-19 DIAGNOSIS — L570 Actinic keratosis: Secondary | ICD-10-CM | POA: Diagnosis not present

## 2014-05-27 DIAGNOSIS — Z0000 Encounter for general adult medical examination without abnormal findings: Secondary | ICD-10-CM | POA: Diagnosis not present

## 2014-05-27 DIAGNOSIS — R799 Abnormal finding of blood chemistry, unspecified: Secondary | ICD-10-CM | POA: Diagnosis not present

## 2014-05-27 DIAGNOSIS — E559 Vitamin D deficiency, unspecified: Secondary | ICD-10-CM | POA: Diagnosis not present

## 2014-05-27 DIAGNOSIS — E785 Hyperlipidemia, unspecified: Secondary | ICD-10-CM | POA: Diagnosis not present

## 2014-05-27 DIAGNOSIS — D509 Iron deficiency anemia, unspecified: Secondary | ICD-10-CM | POA: Diagnosis not present

## 2014-06-02 DIAGNOSIS — Z85828 Personal history of other malignant neoplasm of skin: Secondary | ICD-10-CM | POA: Diagnosis not present

## 2014-06-02 DIAGNOSIS — L821 Other seborrheic keratosis: Secondary | ICD-10-CM | POA: Diagnosis not present

## 2014-06-04 DIAGNOSIS — Z23 Encounter for immunization: Secondary | ICD-10-CM | POA: Diagnosis not present

## 2014-06-09 ENCOUNTER — Ambulatory Visit (INDEPENDENT_AMBULATORY_CARE_PROVIDER_SITE_OTHER): Payer: Medicare Other | Admitting: Cardiovascular Disease

## 2014-06-09 ENCOUNTER — Encounter: Payer: Self-pay | Admitting: Cardiovascular Disease

## 2014-06-09 VITALS — BP 130/66 | HR 48 | Ht 62.0 in | Wt 130.2 lb

## 2014-06-09 DIAGNOSIS — R197 Diarrhea, unspecified: Secondary | ICD-10-CM | POA: Diagnosis not present

## 2014-06-09 DIAGNOSIS — M159 Polyosteoarthritis, unspecified: Secondary | ICD-10-CM | POA: Diagnosis not present

## 2014-06-09 DIAGNOSIS — R0600 Dyspnea, unspecified: Secondary | ICD-10-CM

## 2014-06-09 DIAGNOSIS — I350 Nonrheumatic aortic (valve) stenosis: Secondary | ICD-10-CM | POA: Diagnosis not present

## 2014-06-09 DIAGNOSIS — R0609 Other forms of dyspnea: Secondary | ICD-10-CM

## 2014-06-09 DIAGNOSIS — R001 Bradycardia, unspecified: Secondary | ICD-10-CM

## 2014-06-09 DIAGNOSIS — I071 Rheumatic tricuspid insufficiency: Secondary | ICD-10-CM

## 2014-06-09 DIAGNOSIS — I10 Essential (primary) hypertension: Secondary | ICD-10-CM | POA: Diagnosis not present

## 2014-06-09 DIAGNOSIS — I270 Primary pulmonary hypertension: Secondary | ICD-10-CM | POA: Diagnosis not present

## 2014-06-09 DIAGNOSIS — I272 Pulmonary hypertension, unspecified: Secondary | ICD-10-CM

## 2014-06-09 DIAGNOSIS — H9319 Tinnitus, unspecified ear: Secondary | ICD-10-CM | POA: Diagnosis not present

## 2014-06-09 DIAGNOSIS — E785 Hyperlipidemia, unspecified: Secondary | ICD-10-CM

## 2014-06-09 NOTE — Progress Notes (Signed)
Patient ID: [name], female   DOB: 06/11/1922, 80 y.o.   MRN: [id number]      HPI: [name] is a 80 y.o. female presents to the office today for a 6 month followup cardiology evaluation.  Chief complaint is that of shortness of breath with walking.  [name] has a history of hypertension, hyperlipidemia, as well as valvular heart disease. An echo Doppler study in December 2013 showed normal systolic function with an ejection fraction of 55-60%. She had mild stenosis of her aortic valve, moderately severe calcified anulus of the mitral valve with moderate MR, moderately severe LA dilatation,  mild-to-moderate RA dilatation, moderate tricuspid regurgitation and moderately severe pulmonary hypertension with PA pressure estimated at 66 mm. Last year, I started her on amlodipine at 2.5 mg.  She felt that she was breathing better since initiating this therapy and  her blood pressure had markedly improved. She denies palpitations. She denies chest pain. She denies presyncope or syncope. She had called the office after having significant increased blood pressure around Christmas Eve last year. She subsequently was advised to increase her amlodipine from 2.5 to 5 mg and she had already increased her atenolol to 25 mg twice a day but had noted some leg swelling on the increased dose. A followup echo Doppler study on 11/16/2013.  This showed normal systolic function with an ejection fraction at 60-65%.  Diastolic parameters were normal.  She did have mitral annular calcification with moderate mitral regurgitation.  The left atrium was moderately dilated, the right atrium was moderately dilated, and her tricuspid stenosis was now considered severe.  Pulmonary pressures were slightly reduced from previously, but were still elevated at 61 mm.  Presently, she denies any chest pain.  She does note chronic shortness of breath with walking.  She's not certain that this has significantly increased,  but has been present at times is noticeable.  She denies any recent leg edema.  He has been taking the low-dose diuretic.  She denies presyncope or syncope.  She has been on benazepril 40 mg, atenolol 25 mg in the morning 12.5 mg at night, amlodipine, 2.5 mg 186 CTZ 12.5 mg twice a day.  She also has hyperlipidemia, and is on Crestor 5 mg in addition to fenofibrate 134 mg.  She denies bleeding.  She presents for evaluation.  Past Medical History  Diagnosis Date  . Hypertension   . Hyperlipidemia   . GERD (gastroesophageal reflux disease)   . Anxiety   . Mitral prolapse   . Degenerative arthritis   . Neuropathy   . Peripheral neuropathy     Small fiber   . Diverticulosis   . Mild renal insufficiency   . Hx of echocardiogram 07/30/2012    EF 55-60%; mild LVH; mild AV stenosis & trivial regurg.; mod MV regurg.; LA severly dilated; RV systolic pressure increased consistent with severe pulm. hypertension; RA moderately dilated; mod TV regurg.;   . History of nuclear stress test 09/19/2007    normal pattern of perfusion; post-stress EF 86%; EKG negative for ischemia; low risk scan  . History of Doppler ultrasound 07/30/2012    carotid doppler; normal study  . Foot fracture, left   . Memory disorder 03/05/2014    Past Surgical History  Procedure Laterality Date  . Abdominal hysterectomy    . Replacement total knee Right   . Tear duct probing with strabismus repair      Tear duct repair surgery  . Gallbladder resection    .  Tonsillectomy    . Appendectomy    . Resuspension procedure    .  bilateral bunionectomies    . Foot fracture surgery Left   . Dilation and curettage of uterus    . Umbilical hernia repair      Allergies  Allergen Reactions  . Avelox [Moxifloxacin Hcl In Nacl] Shortness Of Breath and Swelling  . Codeine Swelling  . Garlic     Gi problems  . Latex     Watery blisters   . Onion     Gi problems  . Polysporin [Bacitracin-Polymyxin B]   . Sulfa Drugs Cross  Reactors Swelling    Current Outpatient Prescriptions  Medication Sig Dispense Refill  . fenofibrate micronized (LOFIBRA) 134 MG capsule Take 1 capsule (134 mg total) by mouth daily.  90 capsule  3  . Ferrous Sulfate (IRON) 28 MG TABS Take 1 tablet by mouth daily.      . Flaxseed, Linseed, (FLAXSEED OIL) 1000 MG CAPS Take 3 capsules by mouth daily. 2 @ lunch and 1 @ dinner      . flunisolide (NASALIDE) 0.025 % SOLN Inhale 1 spray into the lungs daily.        Marland Kitchen gabapentin (NEURONTIN) 300 MG capsule Take 1 capsule three times daily and take 3 capsules at bedtime  540 capsule  3  . hydrochlorothiazide (HYDRODIURIL) 25 MG tablet Take 0.5 tablets (12.5 mg total) by mouth 2 (two) times daily.  90 tablet  3  . HYDROcodone-acetaminophen (NORCO/VICODIN) 5-325 MG per tablet Take 5-325 tablets by mouth daily as needed.      . hydroxypropyl methylcellulose (ISOPTO TEARS) 2.5 % ophthalmic solution Place 1 drop into both eyes as needed for dry eyes.      . Lactobacillus (ACIDOPHILUS) 100 MG CAPS Take 100 mg by mouth 2 (two) times daily.        . lansoprazole (PREVACID) 15 MG capsule Take 15 mg by mouth daily.        . Melatonin 10 MG CAPS Take 1 mg by mouth daily as needed.      . Multiple Vitamin (MULTIVITAMIN) tablet Take 1 tablet by mouth daily.        Marland Kitchen pyridOXINE (VITAMIN B-6) 100 MG tablet Take 100 mg by mouth daily.      . rosuvastatin (CRESTOR) 5 MG tablet Take 5 mg by mouth daily.       . simethicone (MYLICON) 716 MG chewable tablet Chew 125 mg by mouth as needed for flatulence.      . triamcinolone ointment (KENALOG) 0.1 % Apply 1 application topically daily as needed.      . vitamin B-12 (CYANOCOBALAMIN) 1000 MCG tablet Take 1,000 mcg by mouth daily.      Marland Kitchen amLODipine (NORVASC) 5 MG tablet Take by mouth daily. Takes 1/2 tablet      . aspirin EC 81 MG tablet Take 81 mg by mouth daily.        Marland Kitchen atenolol (TENORMIN) 25 MG tablet Take 1 tablet (25 mg total) by mouth in the morning. Take 1/2 tablet  (12.5 mg total) by mouth in the evening.  135 tablet  3  . benazepril (LOTENSIN) 40 MG tablet Take 1 tablet (40 mg total) by mouth daily.  90 tablet  3  . Calcium Carbonate-Vitamin D (CALTRATE 600+D) 600-400 MG-UNIT per tablet Take 1 tablet by mouth daily.       . cetirizine (ZYRTEC) 10 MG tablet Take 10 mg by mouth daily.      Marland Kitchen  cholecalciferol (VITAMIN D) 1000 UNITS tablet Take 1,000 Units by mouth daily.        . colestipol (COLESTID) 1 G tablet Take 1 g by mouth 2 (two) times daily as needed. For diarrhea       . cycloSPORINE (RESTASIS) 0.05 % ophthalmic emulsion Place 1 drop into both eyes daily.        . diphenhydrAMINE (BENADRYL) 25 mg capsule Take 25 mg by mouth at bedtime.         No current facility-administered medications for this visit.    Socially she is widowed. Has 2 children and 2 grandchildren. She does walk. Is no tobacco or alcohol use. [facility] She remains active.  ROS General: Negative; No fevers, chills, or night sweats;  HEENT: Negative; No changes in vision or hearing, sinus congestion, difficulty swallowing Pulmonary: Negative; No cough, wheezing, hemoptysis Cardiovascular: See history of present illness No chest pain, presyncope, syncope, palpitations GI: Negative; No nausea, vomiting, diarrhea, or abdominal pain GU: Negative; No dysuria, hematuria, or difficulty voiding Musculoskeletal: Negative; no myalgias, joint pain, or weakness Hematologic/Oncology: Negative; no easy bruising, bleeding Endocrine: Negative; no heat/cold intolerance; no diabetes Neuro: Negative; no changes in balance, headaches Skin: Negative; No rashes or skin lesions Psychiatric: Negative; No behavioral problems, depression Sleep: Negative; No snoring, daytime sleepiness, hypersomnolence, bruxism, restless legs, hypnogognic hallucinations, no cataplexy Other comprehensive 14 point system review is negative.   PE BP 130/66  Pulse 48  Ht '5\' 2"'  (1.575 m)  Wt 130 lb  3.2 oz (59.058 kg)  BMI 23.81 kg/m2  Repeat blood pressure by me was 140/70 General: Alert, oriented, no distress.  Skin: normal turgor, no rashes HEENT: Normocephalic, atraumatic. Pupils round and reactive; sclera anicteric;no lid lag.  Nose without nasal septal hypertrophy Mouth/Parynx benign; Mallinpatti scale 2  Neck: No JVD, possible trace right carotid bruit; normal carotid upstroke Chest wall: No tenderness to palpation Lungs: clear to ausculatation and percussion; no wheezing or rales Heart: RRR, s1 s2 normal; 2/6 systolic murmur in aortic region. No S3 gallop.  No rubs, thrills or heaves.  No hepatic jugular reflux. Abdomen: soft, nontender; no hepatosplenomehaly, BS+; abdominal aorta nontender and not dilated by palpation. Back: No CVA tenderness  Pulses 2+ Extremities: No ankle edema. no clubbing cyanosis , Homan's sign negative  Neurologic: grossly nonfocal Psyhological: Normal affect and mood   ECG (independently read by me): Sinus bradycardia 48 beats per minute.  No ectopy.  PR interval 164 ms  Prior April 2015 ECG (independently by me) sinus bradycardia 50 beats per minute.  No ectopy.  Normal intervals.  No ST segment changes.  Prior 09/02/2013 ECG (independently read by me ): Sinus bradycardia at 51 beats per minute. Left axis deviation. No significant ST change. PR interval 160 ms. QTc interval 418 ms.   LABS:  BMET    Component Value Date/Time   NA 140 04/02/2008 0405   K 3.6 04/02/2008 0405   CL 104 04/02/2008 0405   CO2 32 04/02/2008 0405   GLUCOSE 113* 04/02/2008 0405   BUN 8 04/02/2008 0405   CREATININE 0.82 04/02/2008 0405   CALCIUM 8.9 04/02/2008 0405   GFRNONAA >60 04/02/2008 0405   GFRAA  Value: >60        The eGFR has been calculated using the MDRD equation. This calculation has not been validated in all clinical 04/02/2008 0405     Hepatic Function Panel     Component Value Date/Time   PROT 7.3 03/24/2008  1045   ALBUMIN 4.2 03/24/2008 1045   AST 22  03/24/2008 1045   ALT 20 03/24/2008 1045   ALKPHOS 41 03/24/2008 1045   BILITOT 0.8 03/24/2008 1045     CBC    Component Value Date/Time   WBC 6.2 09/27/2012 1545   RBC 4.41 09/27/2012 1545   HGB 12.2 09/27/2012 1545   HCT 37.0 09/27/2012 1545   PLT 295 09/27/2012 1545   MCV 83.9 09/27/2012 1545   MCH 27.7 09/27/2012 1545   MCHC 33.0 09/27/2012 1545   RDW 14.6 09/27/2012 1545   LYMPHSABS 1.3 09/27/2012 1545   MONOABS 0.5 09/27/2012 1545   EOSABS 0.3 09/27/2012 1545   BASOSABS 0.0 09/27/2012 1545     BNP No results found for this basename: probnp    Lipid Panel  No results found for this basename: chol,  trig,  hdl,  cholhdl,  vldl,  ldlcalc     RADIOLOGY: No results found.    ASSESSMENT AND PLAN: [name] has a history of hypertension, hyperlipidemia, and has documented normal systolic function with normal diastolic parameters.  Her ECG today reveals marked sinus bradycardia.  Is possible that this may be contributing to some of her exertional shortness of breath.  I am recommending she reduce her atenolol dose from 25 mg in the morning and 12.5 mg at night to 12.5 mg twice a day.  I will try to see if she can increase her amlodipine to 5 mg daily.  To control blood pressure.  However, if she does develop recurrent peripheral edema.  She may need to alternate this.  She is now taking hydrochlorothiazide.  There is no edema.  Presently.  She tells me she will be seeing Dr. Shelia Media in the very near future.  She is on Crestor and fenofibrate for her hyperlipidemia.  She's not having any chest pain.  She does have cardiac murmur concordant with her mild aortic valve disease and mitral and tricuspid regurgitation.  If laboratories done by Dr. Concha Pyo last that these be forwarded to my office for my review.  I'll see her in 6 months for reevaluation or sooner if problems arise.   Troy Sine, MD, [facility]  06/09/2014 3:56 PM

## 2014-06-09 NOTE — Patient Instructions (Addendum)
Your physician has recommended you make the following change in your medication: increase the amlodipine to 5 mg daily. ( 1 tablet).  Decrease the atenolol 25 mg to  1/2 tablet twice daily (12.5 mg twice daily.)  If you notice that you start to get some swelling ,then you can alternate the amlodipine and take every other day.   Your physician wants you to follow-up in: 6 months or sooner with Dr. Claiborne Billings. You will receive a reminder letter in the mail two months in advance. If you don't receive a letter, please call our office to schedule the follow-up appointment.

## 2014-06-15 DIAGNOSIS — M159 Polyosteoarthritis, unspecified: Secondary | ICD-10-CM | POA: Diagnosis not present

## 2014-06-15 DIAGNOSIS — L820 Inflamed seborrheic keratosis: Secondary | ICD-10-CM | POA: Diagnosis not present

## 2014-06-15 DIAGNOSIS — I10 Essential (primary) hypertension: Secondary | ICD-10-CM | POA: Diagnosis not present

## 2014-06-15 DIAGNOSIS — N8320 Unspecified ovarian cysts: Secondary | ICD-10-CM | POA: Diagnosis not present

## 2014-06-16 DIAGNOSIS — H903 Sensorineural hearing loss, bilateral: Secondary | ICD-10-CM | POA: Diagnosis not present

## 2014-06-16 DIAGNOSIS — H9113 Presbycusis, bilateral: Secondary | ICD-10-CM | POA: Diagnosis not present

## 2014-06-16 DIAGNOSIS — H9313 Tinnitus, bilateral: Secondary | ICD-10-CM | POA: Diagnosis not present

## 2014-06-30 ENCOUNTER — Other Ambulatory Visit: Payer: Self-pay | Admitting: Internal Medicine

## 2014-06-30 DIAGNOSIS — N83202 Unspecified ovarian cyst, left side: Secondary | ICD-10-CM

## 2014-07-02 ENCOUNTER — Other Ambulatory Visit: Payer: Medicare Other

## 2014-07-07 ENCOUNTER — Encounter: Payer: Self-pay | Admitting: Neurology

## 2014-07-13 ENCOUNTER — Ambulatory Visit
Admission: RE | Admit: 2014-07-13 | Discharge: 2014-07-13 | Disposition: A | Discharge status: Home / Self Care | Payer: Medicare Other | Source: Ambulatory Visit | Attending: Internal Medicine | Admitting: Internal Medicine

## 2014-07-13 ENCOUNTER — Encounter: Payer: Self-pay | Admitting: Neurology

## 2014-07-13 DIAGNOSIS — N83202 Unspecified ovarian cyst, left side: Secondary | ICD-10-CM

## 2014-07-13 DIAGNOSIS — N8320 Unspecified ovarian cysts: Secondary | ICD-10-CM | POA: Diagnosis not present

## 2014-07-17 ENCOUNTER — Ambulatory Visit
Admission: RE | Admit: 2014-07-17 | Discharge: 2014-07-17 | Disposition: A | Discharge status: Home / Self Care | Payer: Medicare Other | Source: Ambulatory Visit | Attending: Neurology | Admitting: Neurology

## 2014-07-17 DIAGNOSIS — R413 Other amnesia: Secondary | ICD-10-CM

## 2014-07-17 DIAGNOSIS — R269 Unspecified abnormalities of gait and mobility: Secondary | ICD-10-CM | POA: Diagnosis not present

## 2014-07-17 DIAGNOSIS — G63 Polyneuropathy in diseases classified elsewhere: Secondary | ICD-10-CM

## 2014-07-20 ENCOUNTER — Telehealth: Payer: Self-pay | Admitting: Neurology

## 2014-07-20 DIAGNOSIS — L89892 Pressure ulcer of other site, stage 2: Secondary | ICD-10-CM | POA: Diagnosis not present

## 2014-07-20 DIAGNOSIS — M79671 Pain in right foot: Secondary | ICD-10-CM | POA: Diagnosis not present

## 2014-07-20 DIAGNOSIS — R2689 Other abnormalities of gait and mobility: Secondary | ICD-10-CM | POA: Diagnosis not present

## 2014-07-20 NOTE — Telephone Encounter (Signed)
I called the patient. MRI the brain was done, shows no change from 2007. This study was ordered in July 2015, just now getting done for some reason. I have indicated the patient wants to start the medication for memory such as Aricept, she is to call our office, we will call in a prescription.   MRI brain 07/19/2014:  IMPRESSION:  Abnormal MRI brain (without) demonstrating: 1. Mild diffuse and moderate perisylvian atrophy.  2. Mild periventricular and subcortical chronic small vessel ischemic disease.  3. No acute findings. 4. No change from MRI on 12/19/05.

## 2014-07-21 DIAGNOSIS — E780 Pure hypercholesterolemia: Secondary | ICD-10-CM | POA: Diagnosis not present

## 2014-08-05 ENCOUNTER — Other Ambulatory Visit: Payer: Self-pay | Admitting: Internal Medicine

## 2014-08-05 DIAGNOSIS — N83202 Unspecified ovarian cyst, left side: Secondary | ICD-10-CM

## 2014-08-09 DIAGNOSIS — M79674 Pain in right toe(s): Secondary | ICD-10-CM | POA: Diagnosis not present

## 2014-08-09 DIAGNOSIS — B351 Tinea unguium: Secondary | ICD-10-CM | POA: Diagnosis not present

## 2014-08-09 DIAGNOSIS — M79671 Pain in right foot: Secondary | ICD-10-CM | POA: Diagnosis not present

## 2014-08-09 DIAGNOSIS — M79675 Pain in left toe(s): Secondary | ICD-10-CM | POA: Diagnosis not present

## 2014-08-09 DIAGNOSIS — L89892 Pressure ulcer of other site, stage 2: Secondary | ICD-10-CM | POA: Diagnosis not present

## 2014-08-30 DIAGNOSIS — M79674 Pain in right toe(s): Secondary | ICD-10-CM | POA: Diagnosis not present

## 2014-08-30 DIAGNOSIS — B351 Tinea unguium: Secondary | ICD-10-CM | POA: Diagnosis not present

## 2014-09-02 ENCOUNTER — Encounter: Payer: Self-pay | Admitting: Neurology

## 2014-09-02 ENCOUNTER — Ambulatory Visit (INDEPENDENT_AMBULATORY_CARE_PROVIDER_SITE_OTHER): Payer: Medicare Other | Admitting: Neurology

## 2014-09-02 VITALS — BP 126/61 | HR 60 | Ht 61.0 in | Wt 129.6 lb

## 2014-09-02 DIAGNOSIS — G63 Polyneuropathy in diseases classified elsewhere: Secondary | ICD-10-CM

## 2014-09-02 DIAGNOSIS — R413 Other amnesia: Secondary | ICD-10-CM

## 2014-09-02 DIAGNOSIS — R269 Unspecified abnormalities of gait and mobility: Secondary | ICD-10-CM | POA: Diagnosis not present

## 2014-09-02 NOTE — Progress Notes (Signed)
Reason for visit: Peripheral neuropathy  [name] is an 81 y.o. female  History of present illness:  [name] is a 81 year old right-handed white female with a history of a peripheral neuropathy associated with a mild gait disorder. She has reported some minor issues with memory when last seen, and MRI of the brain was done showing no change from a prior study done in 2007. The patient has mild small vessel ischemic changes by MRI. The patient has done quite well with the memory, no changes have been noted over the last 6 months. She is still driving a car, and remains quite active. She indicates that the gabapentin is quite effective for controlling her peripheral neuropathy pain. She returns for an evaluation. No other new medical issues have come up since last seen.  Past Medical History  Diagnosis Date  . Hypertension   . Hyperlipidemia   . GERD (gastroesophageal reflux disease)   . Anxiety   . Mitral prolapse   . Degenerative arthritis   . Neuropathy   . Peripheral neuropathy     Small fiber   . Diverticulosis   . Mild renal insufficiency   . Hx of echocardiogram 07/30/2012    EF 55-60%; mild LVH; mild AV stenosis & trivial regurg.; mod MV regurg.; LA severly dilated; RV systolic pressure increased consistent with severe pulm. hypertension; RA moderately dilated; mod TV regurg.;   . History of nuclear stress test 09/19/2007    normal pattern of perfusion; post-stress EF 86%; EKG negative for ischemia; low risk scan  . History of Doppler ultrasound 07/30/2012    carotid doppler; normal study  . Foot fracture, left   . Memory disorder 03/05/2014    Past Surgical History  Procedure Laterality Date  . Abdominal hysterectomy    . Replacement total knee Right   . Tear duct probing with strabismus repair      Tear duct repair surgery  . Gallbladder resection    . Tonsillectomy    . Appendectomy    . Resuspension procedure    .  bilateral bunionectomies    .  Foot fracture surgery Left   . Dilation and curettage of uterus    . Umbilical hernia repair      Family History  Problem Relation Age of Onset  . Pneumonia Mother   . Coronary artery disease Mother   . Heart disease Father   . Cancer Father   . Arthritis Sister   . Hypertension Mother   . Hypertension Father     Social history:  reports that she has quit smoking. She has never used smokeless tobacco. She reports that she does not drink alcohol or use illicit drugs.    Allergies  Allergen Reactions  . Avelox [Moxifloxacin Hcl In Nacl] Shortness Of Breath and Swelling  . Codeine Swelling  . Garlic     Gi problems  . Latex     Watery blisters   . Onion     Gi problems  . Polysporin [Bacitracin-Polymyxin B]   . Sulfa Drugs Cross Reactors Swelling    Medications:  Current Outpatient Prescriptions on File Prior to Visit  Medication Sig Dispense Refill  . amLODipine (NORVASC) 5 MG tablet Take 5 mg by mouth daily.     Marland Kitchen aspirin EC 81 MG tablet Take 81 mg by mouth daily.      Marland Kitchen atenolol (TENORMIN) 25 MG tablet 12.5 mg 2 (two) times daily.    . benazepril (LOTENSIN) 40 MG  tablet Take 1 tablet (40 mg total) by mouth daily. 90 tablet 3  . Calcium Carbonate-Vitamin D (CALTRATE 600+D) 600-400 MG-UNIT per tablet Take 1 tablet by mouth daily.     . cetirizine (ZYRTEC) 10 MG tablet Take 10 mg by mouth daily.    . cholecalciferol (VITAMIN D) 1000 UNITS tablet Take 1,000 Units by mouth daily.      . colestipol (COLESTID) 1 G tablet Take 1 g by mouth 2 (two) times daily as needed. For diarrhea     . cycloSPORINE (RESTASIS) 0.05 % ophthalmic emulsion Place 1 drop into both eyes daily.      . diphenhydrAMINE (BENADRYL) 25 mg capsule Take 25 mg by mouth at bedtime.      . fenofibrate micronized (LOFIBRA) 134 MG capsule Take 1 capsule (134 mg total) by mouth daily. 90 capsule 3  . Ferrous Sulfate (IRON) 28 MG TABS Take 1 tablet by mouth daily.    . Flaxseed, Linseed, (FLAXSEED OIL) 1000 MG  CAPS Take 3 capsules by mouth daily. 2 @ lunch and 1 @ dinner    . flunisolide (NASALIDE) 0.025 % SOLN Inhale 1 spray into the lungs daily.      Marland Kitchen gabapentin (NEURONTIN) 300 MG capsule Take 1 capsule three times daily and take 3 capsules at bedtime 540 capsule 3  . hydrochlorothiazide (HYDRODIURIL) 25 MG tablet Take 0.5 tablets (12.5 mg total) by mouth 2 (two) times daily. 90 tablet 3  . HYDROcodone-acetaminophen (NORCO/VICODIN) 5-325 MG per tablet Take 5-325 tablets by mouth daily as needed.    . hydroxypropyl methylcellulose (ISOPTO TEARS) 2.5 % ophthalmic solution Place 1 drop into both eyes as needed for dry eyes.    . Lactobacillus (ACIDOPHILUS) 100 MG CAPS Take 100 mg by mouth 2 (two) times daily.      . lansoprazole (PREVACID) 15 MG capsule Take 15 mg by mouth daily.      . Melatonin 10 MG CAPS Take 1 mg by mouth daily as needed.    . Multiple Vitamin (MULTIVITAMIN) tablet Take 1 tablet by mouth daily.      Marland Kitchen pyridOXINE (VITAMIN B-6) 100 MG tablet Take 100 mg by mouth daily.    . rosuvastatin (CRESTOR) 5 MG tablet Take 5 mg by mouth daily.     . simethicone (MYLICON) 448 MG chewable tablet Chew 125 mg by mouth as needed for flatulence.    . triamcinolone ointment (KENALOG) 0.1 % Apply 1 application topically daily as needed.    . vitamin B-12 (CYANOCOBALAMIN) 1000 MCG tablet Take 1,000 mcg by mouth daily.     No current facility-administered medications on file prior to visit.    ROS:  Out of a complete 14 system review of symptoms, the patient complains only of the following symptoms, and all other reviewed systems are negative.  Ringing in the ears Shortness of breath Heart murmur Black stools, diarrhea Food allergies Joint pain, back pain, achy muscles, muscle cramps Speech difficulty, memory loss Decreased concentration  Blood pressure 126/61, pulse 60, height 5\' 1"  (1.549 m), weight 129 lb 9.6 oz (58.786 kg).  Physical Exam  General: The patient is alert and  cooperative at the time of the examination.  Skin: No significant peripheral edema is noted.   Neurologic Exam  Mental status: The patient is oriented x 3. Mini-Mental Status Examination done today shows a total score 29/30.  Cranial nerves: Facial symmetry is present. Speech is normal, no aphasia or dysarthria is noted. Extraocular movements are full. Visual fields  are full.  Motor: The patient has good strength in all 4 extremities.  Sensory examination: Soft touch sensation is symmetric on the face, arms, and legs.  Coordination: The patient has good finger-nose-finger and heel-to-shin bilaterally.  Gait and station: The patient has a normal gait. Tandem gait is was not attempted. Romberg is negative. No drift is seen.  Reflexes: Deep tendon reflexes are symmetric, but are depressed throughout.   Assessment/Plan:  1. Peripheral neuropathy  2. Minimum cognitive impairment  3. Mild gait disorder  The patient is doing fairly well this time with the peripheral neuropathy pain. She will continue the gabapentin. We will follow the memory over time. She will follow-up in about 6 or 7 months.  Jill Alexanders MD 09/02/2014 8:04 PM  [facility] [address] [place], [place] [address]  Phone [phone] Fax [phone]

## 2014-09-02 NOTE — Patient Instructions (Signed)

## 2014-09-06 ENCOUNTER — Ambulatory Visit: Payer: Medicare Other | Admitting: Neurology

## 2014-09-09 DIAGNOSIS — E780 Pure hypercholesterolemia: Secondary | ICD-10-CM | POA: Diagnosis not present

## 2014-09-09 DIAGNOSIS — I10 Essential (primary) hypertension: Secondary | ICD-10-CM | POA: Diagnosis not present

## 2014-09-09 DIAGNOSIS — M159 Polyosteoarthritis, unspecified: Secondary | ICD-10-CM | POA: Diagnosis not present

## 2014-09-13 ENCOUNTER — Emergency Department (HOSPITAL_COMMUNITY): Payer: Medicare Other

## 2014-09-13 ENCOUNTER — Inpatient Hospital Stay (HOSPITAL_COMMUNITY)
Admission: EM | Admit: 2014-09-13 | Discharge: 2014-09-16 | DRG: 282 | Disposition: A | Discharge status: Home / Self Care | Payer: Medicare Other | Attending: Cardiology | Admitting: Cardiology

## 2014-09-13 ENCOUNTER — Encounter (HOSPITAL_COMMUNITY): Payer: Self-pay

## 2014-09-13 DIAGNOSIS — I272 Other secondary pulmonary hypertension: Secondary | ICD-10-CM | POA: Diagnosis present

## 2014-09-13 DIAGNOSIS — I214 Non-ST elevation (NSTEMI) myocardial infarction: Secondary | ICD-10-CM | POA: Diagnosis not present

## 2014-09-13 DIAGNOSIS — I700 Atherosclerosis of aorta: Secondary | ICD-10-CM | POA: Diagnosis present

## 2014-09-13 DIAGNOSIS — E780 Pure hypercholesterolemia: Secondary | ICD-10-CM | POA: Diagnosis present

## 2014-09-13 DIAGNOSIS — D509 Iron deficiency anemia, unspecified: Secondary | ICD-10-CM | POA: Diagnosis present

## 2014-09-13 DIAGNOSIS — Z87891 Personal history of nicotine dependence: Secondary | ICD-10-CM | POA: Diagnosis not present

## 2014-09-13 DIAGNOSIS — I340 Nonrheumatic mitral (valve) insufficiency: Secondary | ICD-10-CM | POA: Diagnosis present

## 2014-09-13 DIAGNOSIS — K219 Gastro-esophageal reflux disease without esophagitis: Secondary | ICD-10-CM | POA: Diagnosis present

## 2014-09-13 DIAGNOSIS — Z7901 Long term (current) use of anticoagulants: Secondary | ICD-10-CM | POA: Diagnosis not present

## 2014-09-13 DIAGNOSIS — I270 Primary pulmonary hypertension: Secondary | ICD-10-CM | POA: Diagnosis not present

## 2014-09-13 DIAGNOSIS — I350 Nonrheumatic aortic (valve) stenosis: Secondary | ICD-10-CM | POA: Diagnosis present

## 2014-09-13 DIAGNOSIS — Z91040 Latex allergy status: Secondary | ICD-10-CM

## 2014-09-13 DIAGNOSIS — R079 Chest pain, unspecified: Secondary | ICD-10-CM | POA: Diagnosis not present

## 2014-09-13 DIAGNOSIS — I2584 Coronary atherosclerosis due to calcified coronary lesion: Secondary | ICD-10-CM | POA: Diagnosis present

## 2014-09-13 DIAGNOSIS — Z8249 Family history of ischemic heart disease and other diseases of the circulatory system: Secondary | ICD-10-CM

## 2014-09-13 DIAGNOSIS — G6289 Other specified polyneuropathies: Secondary | ICD-10-CM | POA: Diagnosis present

## 2014-09-13 DIAGNOSIS — Z7982 Long term (current) use of aspirin: Secondary | ICD-10-CM | POA: Diagnosis not present

## 2014-09-13 DIAGNOSIS — R0600 Dyspnea, unspecified: Secondary | ICD-10-CM | POA: Diagnosis present

## 2014-09-13 DIAGNOSIS — I071 Rheumatic tricuspid insufficiency: Secondary | ICD-10-CM | POA: Diagnosis present

## 2014-09-13 DIAGNOSIS — R0789 Other chest pain: Secondary | ICD-10-CM | POA: Diagnosis not present

## 2014-09-13 DIAGNOSIS — I2510 Atherosclerotic heart disease of native coronary artery without angina pectoris: Secondary | ICD-10-CM | POA: Diagnosis present

## 2014-09-13 DIAGNOSIS — Z882 Allergy status to sulfonamides status: Secondary | ICD-10-CM | POA: Diagnosis not present

## 2014-09-13 DIAGNOSIS — R5383 Other fatigue: Secondary | ICD-10-CM | POA: Diagnosis not present

## 2014-09-13 DIAGNOSIS — Z888 Allergy status to other drugs, medicaments and biological substances status: Secondary | ICD-10-CM

## 2014-09-13 DIAGNOSIS — I10 Essential (primary) hypertension: Secondary | ICD-10-CM | POA: Diagnosis present

## 2014-09-13 DIAGNOSIS — R0602 Shortness of breath: Secondary | ICD-10-CM | POA: Diagnosis not present

## 2014-09-13 DIAGNOSIS — I480 Paroxysmal atrial fibrillation: Secondary | ICD-10-CM | POA: Diagnosis present

## 2014-09-13 DIAGNOSIS — Z885 Allergy status to narcotic agent status: Secondary | ICD-10-CM

## 2014-09-13 DIAGNOSIS — Z96651 Presence of right artificial knee joint: Secondary | ICD-10-CM | POA: Diagnosis present

## 2014-09-13 DIAGNOSIS — I361 Nonrheumatic tricuspid (valve) insufficiency: Secondary | ICD-10-CM | POA: Diagnosis present

## 2014-09-13 DIAGNOSIS — E782 Mixed hyperlipidemia: Secondary | ICD-10-CM | POA: Diagnosis present

## 2014-09-13 DIAGNOSIS — E785 Hyperlipidemia, unspecified: Secondary | ICD-10-CM | POA: Diagnosis not present

## 2014-09-13 DIAGNOSIS — Z7951 Long term (current) use of inhaled steroids: Secondary | ICD-10-CM | POA: Diagnosis not present

## 2014-09-13 DIAGNOSIS — R195 Other fecal abnormalities: Secondary | ICD-10-CM | POA: Diagnosis present

## 2014-09-13 DIAGNOSIS — R0609 Other forms of dyspnea: Secondary | ICD-10-CM

## 2014-09-13 DIAGNOSIS — D500 Iron deficiency anemia secondary to blood loss (chronic): Secondary | ICD-10-CM | POA: Diagnosis not present

## 2014-09-13 DIAGNOSIS — I4891 Unspecified atrial fibrillation: Secondary | ICD-10-CM | POA: Diagnosis not present

## 2014-09-13 DIAGNOSIS — Z9889 Other specified postprocedural states: Secondary | ICD-10-CM | POA: Diagnosis not present

## 2014-09-13 HISTORY — DX: Iron deficiency anemia, unspecified: D50.9

## 2014-09-13 HISTORY — DX: Long term (current) use of anticoagulants: Z79.01

## 2014-09-13 HISTORY — DX: Peripheral vascular disease, unspecified: I73.9

## 2014-09-13 LAB — BASIC METABOLIC PANEL
Anion gap: 7 (ref 5–15)
BUN: 24 mg/dL — ABNORMAL HIGH (ref 6–23)
CO2: 28 mmol/L (ref 19–32)
Calcium: 10 mg/dL (ref 8.4–10.5)
Chloride: 106 mmol/L (ref 96–112)
Creatinine, Ser: 0.94 mg/dL (ref 0.50–1.10)
GFR calc Af Amer: 60 mL/min — ABNORMAL LOW (ref >90)
GFR calc non Af Amer: 52 mL/min — ABNORMAL LOW (ref >90)
Glucose, Bld: 102 mg/dL — ABNORMAL HIGH (ref 70–99)
Potassium: 3.6 mmol/L (ref 3.5–5.1)
Sodium: 141 mmol/L (ref 135–145)

## 2014-09-13 LAB — I-STAT TROPONIN, ED: Troponin i, poc: 0.2 ng/mL (ref 0.00–0.08)

## 2014-09-13 LAB — CBC WITH DIFFERENTIAL/PLATELET
Basophils Absolute: 0 10*3/uL (ref 0.0–0.1)
Basophils Relative: 1 % (ref 0–1)
Eosinophils Absolute: 0.3 10*3/uL (ref 0.0–0.7)
Eosinophils Relative: 7 % — ABNORMAL HIGH (ref 0–5)
HCT: 35.6 % — ABNORMAL LOW (ref 36.0–46.0)
Hemoglobin: 12.2 g/dL (ref 12.0–15.0)
Lymphocytes Relative: 23 % (ref 12–46)
Lymphs Abs: 1.2 10*3/uL (ref 0.7–4.0)
MCH: 30.6 pg (ref 26.0–34.0)
MCHC: 34.3 g/dL (ref 30.0–36.0)
MCV: 89.2 fL (ref 78.0–100.0)
Monocytes Absolute: 0.4 10*3/uL (ref 0.1–1.0)
Monocytes Relative: 7 % (ref 3–12)
Neutro Abs: 3.2 10*3/uL (ref 1.7–7.7)
Neutrophils Relative %: 62 % (ref 43–77)
Platelets: 224 10*3/uL (ref 150–400)
RBC: 3.99 MIL/uL (ref 3.87–5.11)
RDW: 13.1 % (ref 11.5–15.5)
WBC: 5.1 10*3/uL (ref 4.0–10.5)

## 2014-09-13 LAB — PROTIME-INR
INR: 1.2 (ref 0.00–1.49)
Prothrombin Time: 15.3 seconds — ABNORMAL HIGH (ref 11.6–15.2)

## 2014-09-13 LAB — TROPONIN I
Troponin I: 0.23 ng/mL — ABNORMAL HIGH (ref <0.031)
Troponin I: 0.79 ng/mL (ref <0.031)
Troponin I: 1.15 ng/mL (ref <0.031)

## 2014-09-13 MED ORDER — CARVEDILOL 6.25 MG PO TABS
6.2500 mg | ORAL_TABLET | Freq: Two times a day (BID) | ORAL | Status: DC
  Administered 2014-09-13 – 2014-09-16 (×6): 6.25 mg via ORAL
  Filled 2014-09-13 (×6): qty 1

## 2014-09-13 MED ORDER — SODIUM CHLORIDE 0.9 % IJ SOLN
3.0000 mL | Freq: Two times a day (BID) | INTRAMUSCULAR | Status: DC
  Administered 2014-09-13 – 2014-09-15 (×3): 3 mL via INTRAVENOUS

## 2014-09-13 MED ORDER — HYDROCODONE-ACETAMINOPHEN 5-325 MG PO TABS
1.0000 | ORAL_TABLET | Freq: Three times a day (TID) | ORAL | Status: DC
  Administered 2014-09-13 – 2014-09-16 (×8): 1 via ORAL
  Filled 2014-09-13 (×10): qty 1

## 2014-09-13 MED ORDER — HYDROCODONE-ACETAMINOPHEN 5-325 MG PO TABS: 5.0000 | ORAL_TABLET | Freq: Three times a day (TID) | ORAL | Status: DC

## 2014-09-13 MED ORDER — AMLODIPINE BESYLATE 5 MG PO TABS
5.0000 mg | ORAL_TABLET | Freq: Every day | ORAL | Status: DC
  Administered 2014-09-13 – 2014-09-14 (×2): 5 mg via ORAL
  Filled 2014-09-13 (×2): qty 1

## 2014-09-13 MED ORDER — FERROUS SULFATE 325 (65 FE) MG PO TABS
325.0000 mg | ORAL_TABLET | Freq: Every day | ORAL | Status: DC
  Administered 2014-09-14 – 2014-09-16 (×3): 325 mg via ORAL
  Filled 2014-09-13 (×3): qty 1

## 2014-09-13 MED ORDER — ACIDOPHILUS 100 MG PO CAPS: 100.0000 mg | ORAL_CAPSULE | Freq: Every day | ORAL | Status: DC

## 2014-09-13 MED ORDER — GABAPENTIN 100 MG PO CAPS
100.0000 mg | ORAL_CAPSULE | Freq: Three times a day (TID) | ORAL | Status: DC
  Administered 2014-09-14 – 2014-09-16 (×7): 100 mg via ORAL
  Filled 2014-09-13 (×8): qty 1

## 2014-09-13 MED ORDER — ONE-DAILY MULTI VITAMINS PO TABS: 1.0000 | ORAL_TABLET | Freq: Every day | ORAL | Status: DC

## 2014-09-13 MED ORDER — VITAMIN D 1000 UNITS PO TABS
1000.0000 [IU] | ORAL_TABLET | Freq: Every day | ORAL | Status: DC
  Administered 2014-09-13 – 2014-09-16 (×4): 1000 [IU] via ORAL
  Filled 2014-09-13 (×4): qty 1

## 2014-09-13 MED ORDER — GABAPENTIN 300 MG PO CAPS
300.0000 mg | ORAL_CAPSULE | Freq: Every day | ORAL | Status: DC
  Administered 2014-09-13 – 2014-09-15 (×3): 300 mg via ORAL
  Filled 2014-09-13 (×3): qty 1

## 2014-09-13 MED ORDER — NITROGLYCERIN 0.4 MG SL SUBL: 0.4000 mg | SUBLINGUAL_TABLET | SUBLINGUAL | Status: DC | PRN

## 2014-09-13 MED ORDER — CALCIUM CARBONATE-VITAMIN D 600-400 MG-UNIT PO TABS: 1.0000 | ORAL_TABLET | Freq: Every day | ORAL | Status: DC

## 2014-09-13 MED ORDER — ASPIRIN EC 81 MG PO TBEC: 81.0000 mg | DELAYED_RELEASE_TABLET | Freq: Every day | ORAL | Status: DC

## 2014-09-13 MED ORDER — BENAZEPRIL HCL 40 MG PO TABS
40.0000 mg | ORAL_TABLET | Freq: Every day | ORAL | Status: DC
  Administered 2014-09-14 – 2014-09-16 (×3): 40 mg via ORAL
  Filled 2014-09-13 (×4): qty 1

## 2014-09-13 MED ORDER — COLESTIPOL HCL 1 G PO TABS
1.0000 g | ORAL_TABLET | Freq: Two times a day (BID) | ORAL | Status: DC | PRN
  Filled 2014-09-13: qty 1

## 2014-09-13 MED ORDER — CYCLOSPORINE 0.05 % OP EMUL
1.0000 [drp] | Freq: Every day | OPHTHALMIC | Status: DC
  Administered 2014-09-13 – 2014-09-16 (×4): 1 [drp] via OPHTHALMIC
  Filled 2014-09-13 (×4): qty 1

## 2014-09-13 MED ORDER — HEPARIN BOLUS VIA INFUSION
3500.0000 [IU] | Freq: Once | INTRAVENOUS | Status: AC
  Administered 2014-09-13: 3500 [IU] via INTRAVENOUS
  Filled 2014-09-13: qty 3500

## 2014-09-13 MED ORDER — HYDROCHLOROTHIAZIDE 25 MG PO TABS
25.0000 mg | ORAL_TABLET | Freq: Every day | ORAL | Status: DC
  Administered 2014-09-13 – 2014-09-16 (×4): 25 mg via ORAL
  Filled 2014-09-13 (×4): qty 1

## 2014-09-13 MED ORDER — ALPRAZOLAM 0.25 MG PO TABS: 0.2500 mg | ORAL_TABLET | Freq: Two times a day (BID) | ORAL | Status: DC | PRN

## 2014-09-13 MED ORDER — LORATADINE 10 MG PO TABS
10.0000 mg | ORAL_TABLET | Freq: Every day | ORAL | Status: DC
  Administered 2014-09-13 – 2014-09-16 (×4): 10 mg via ORAL
  Filled 2014-09-13 (×4): qty 1

## 2014-09-13 MED ORDER — ASPIRIN 81 MG PO CHEW
324.0000 mg | CHEWABLE_TABLET | ORAL | Status: AC
  Administered 2014-09-14: 324 mg via ORAL
  Filled 2014-09-13: qty 4

## 2014-09-13 MED ORDER — ROSUVASTATIN CALCIUM 10 MG PO TABS
20.0000 mg | ORAL_TABLET | Freq: Every day | ORAL | Status: DC
  Administered 2014-09-13: 20 mg via ORAL
  Filled 2014-09-13 (×2): qty 2

## 2014-09-13 MED ORDER — CALCIUM CARBONATE-VITAMIN D 500-200 MG-UNIT PO TABS
1.0000 | ORAL_TABLET | Freq: Every day | ORAL | Status: DC
  Administered 2014-09-14 – 2014-09-16 (×3): 1 via ORAL
  Filled 2014-09-13 (×3): qty 1

## 2014-09-13 MED ORDER — ACETAMINOPHEN 325 MG PO TABS: 650.0000 mg | ORAL_TABLET | ORAL | Status: DC | PRN

## 2014-09-13 MED ORDER — FENOFIBRATE 160 MG PO TABS
160.0000 mg | ORAL_TABLET | Freq: Every day | ORAL | Status: DC
  Administered 2014-09-13 – 2014-09-16 (×4): 160 mg via ORAL
  Filled 2014-09-13 (×4): qty 1

## 2014-09-13 MED ORDER — PANTOPRAZOLE SODIUM 40 MG PO TBEC: 40.0000 mg | DELAYED_RELEASE_TABLET | Freq: Every day | ORAL | Status: DC

## 2014-09-13 MED ORDER — SODIUM CHLORIDE 0.9 % IJ SOLN: 3.0000 mL | INTRAMUSCULAR | Status: DC | PRN

## 2014-09-13 MED ORDER — VITAMIN B-12 1000 MCG PO TABS
1000.0000 ug | ORAL_TABLET | Freq: Every day | ORAL | Status: DC
  Administered 2014-09-13 – 2014-09-16 (×2): 1000 ug via ORAL
  Filled 2014-09-13 (×3): qty 1

## 2014-09-13 MED ORDER — ZOLPIDEM TARTRATE 5 MG PO TABS
5.0000 mg | ORAL_TABLET | Freq: Every evening | ORAL | Status: DC | PRN
  Administered 2014-09-14 – 2014-09-15 (×3): 5 mg via ORAL
  Filled 2014-09-13 (×3): qty 1

## 2014-09-13 MED ORDER — DIPHENHYDRAMINE HCL 25 MG PO CAPS: 25.0000 mg | ORAL_CAPSULE | Freq: Every day | ORAL | Status: DC

## 2014-09-13 MED ORDER — SODIUM CHLORIDE 0.9 % IV SOLN
1.0000 mL/kg/h | INTRAVENOUS | Status: DC
  Administered 2014-09-14: 1 mL/kg/h via INTRAVENOUS

## 2014-09-13 MED ORDER — SIMETHICONE 80 MG PO CHEW: 80.0000 mg | CHEWABLE_TABLET | Freq: Four times a day (QID) | ORAL | Status: DC | PRN

## 2014-09-13 MED ORDER — HYPROMELLOSE (GONIOSCOPIC) 2.5 % OP SOLN: 1.0000 [drp] | OPHTHALMIC | Status: DC | PRN

## 2014-09-13 MED ORDER — PANTOPRAZOLE SODIUM 40 MG PO TBEC
40.0000 mg | DELAYED_RELEASE_TABLET | Freq: Every day | ORAL | Status: DC
  Administered 2014-09-14 – 2014-09-16 (×3): 40 mg via ORAL
  Filled 2014-09-13 (×3): qty 1

## 2014-09-13 MED ORDER — HEPARIN (PORCINE) IN NACL 100-0.45 UNIT/ML-% IJ SOLN
700.0000 [IU]/h | INTRAMUSCULAR | Status: DC
  Administered 2014-09-13: 700 [IU]/h via INTRAVENOUS
  Filled 2014-09-13: qty 250

## 2014-09-13 MED ORDER — ADULT MULTIVITAMIN W/MINERALS CH
1.0000 | ORAL_TABLET | Freq: Every day | ORAL | Status: DC
  Administered 2014-09-14 – 2014-09-16 (×3): 1 via ORAL
  Filled 2014-09-13 (×4): qty 1

## 2014-09-13 MED ORDER — SODIUM CHLORIDE 0.9 % IV SOLN: 250.0000 mL | INTRAVENOUS | Status: DC | PRN

## 2014-09-13 MED ORDER — SODIUM CHLORIDE 0.9 % IJ SOLN
3.0000 mL | Freq: Two times a day (BID) | INTRAMUSCULAR | Status: DC
  Administered 2014-09-13: 3 mL via INTRAVENOUS

## 2014-09-13 MED ORDER — RISAQUAD PO CAPS
1.0000 | ORAL_CAPSULE | Freq: Every day | ORAL | Status: DC
  Administered 2014-09-13 – 2014-09-16 (×2): 1 via ORAL
  Filled 2014-09-13 (×3): qty 1

## 2014-09-13 MED ORDER — FLAXSEED OIL 1000 MG PO CAPS: 1.0000 | ORAL_CAPSULE | Freq: Every day | ORAL | Status: DC

## 2014-09-13 MED ORDER — FLUTICASONE PROPIONATE 50 MCG/ACT NA SUSP
1.0000 | Freq: Every day | NASAL | Status: DC
  Administered 2014-09-14 – 2014-09-16 (×3): 1 via NASAL
  Filled 2014-09-13: qty 16

## 2014-09-13 MED ORDER — ONDANSETRON HCL 4 MG/2ML IJ SOLN: 4.0000 mg | Freq: Four times a day (QID) | INTRAMUSCULAR | Status: DC | PRN

## 2014-09-13 MED ORDER — VITAMIN B-6 100 MG PO TABS
100.0000 mg | ORAL_TABLET | Freq: Every day | ORAL | Status: DC
  Administered 2014-09-13: 100 mg via ORAL
  Filled 2014-09-13 (×4): qty 1

## 2014-09-13 MED ORDER — FLUNISOLIDE 25 MCG/ACT (0.025%) NA SOLN
1.0000 | Freq: Every day | NASAL | Status: DC
  Filled 2014-09-13: qty 25

## 2014-09-13 MED ORDER — IRON 28 MG PO TABS: 1.0000 | ORAL_TABLET | Freq: Every day | ORAL | Status: DC

## 2014-09-13 NOTE — ED Notes (Signed)
0.79 Troponin level. Per Critical lab value. MD notified.

## 2014-09-13 NOTE — ED Notes (Signed)
Per [facility]: [facility]. [facility] [place]. Woke up this AM around 0400 to use bathroom when she had sudden onset of central chest squeezing with no radiation. Pain subsided on its own. Pt. Went back to sleep and woke up around 0800 to make breakfast when she had the same onset of CP with SOB. [facility] staff gave 324 ASA. Pt. Denies SOB/CP at this time.

## 2014-09-13 NOTE — Progress Notes (Signed)
ANTICOAGULATION CONSULT NOTE - Initial Consult  Pharmacy Consult for Heparin Indication: ACS/STEMI  Allergies  Allergen Reactions  . Avelox [Moxifloxacin Hcl In Nacl] Shortness Of Breath and Swelling  . Codeine Swelling  . Garlic     Gi problems  . Latex     Watery blisters   . Onion     Gi problems  . Polysporin [Bacitracin-Polymyxin B]   . Sulfa Drugs Cross Reactors Swelling    Patient Measurements: Height: 5\' 1"  (154.9 cm) Weight: 129 lb (58.514 kg) IBW/kg (Calculated) : 47.8  Vital Signs: Temp: 97.9 F (36.6 C) (01/25 1006) Temp Source: Oral (01/25 1006) BP: 130/50 mmHg (01/25 1400) Pulse Rate: 51 (01/25 1400)  Labs:  Recent Labs  09/13/14 1036 09/13/14 1257  HGB 12.2  --   HCT 35.6*  --   PLT 224  --   CREATININE 0.94  --   TROPONINI 0.23* 0.79*    Estimated Creatinine Clearance: 32.7 mL/min (by C-G formula based on Cr of 0.94).   Medical History: Past Medical History  Diagnosis Date  . Hypertension   . Hyperlipidemia   . GERD (gastroesophageal reflux disease)   . Anxiety   . Mitral prolapse   . Degenerative arthritis   . Neuropathy   . Peripheral neuropathy     Small fiber   . Diverticulosis   . Mild renal insufficiency   . Hx of echocardiogram 07/30/2012    EF 55-60%; mild LVH; mild AV stenosis & trivial regurg.; mod MV regurg.; LA severly dilated; RV systolic pressure increased consistent with severe pulm. hypertension; RA moderately dilated; mod TV regurg.;   . History of nuclear stress test 09/19/2007    normal pattern of perfusion; post-stress EF 86%; EKG negative for ischemia; low risk scan  . Carotid arterial disease 07/30/2012    carotid doppler; normal study  . Foot fracture, left   . Memory disorder 03/05/2014    Medications:   (Not in a hospital admission) Scheduled:  Marland Kitchen Acidophilus  100 mg Oral Daily  . amLODipine  5 mg Oral Daily  . aspirin EC  81 mg Oral Daily  . benazepril  40 mg Oral Daily  . Calcium Carbonate-Vitamin D   1 tablet Oral Daily  . carvedilol  6.25 mg Oral BID WC  . cholecalciferol  1,000 Units Oral Daily  . cycloSPORINE  1 drop Both Eyes Daily  . diphenhydrAMINE  25 mg Oral QHS  . fenofibrate  160 mg Oral Daily  . Flaxseed Oil  1-2 capsule Oral Daily  . flunisolide  1 spray Nasal Daily  . gabapentin  100-400 mg Oral TID AC & HS  . hydrochlorothiazide  25 mg Oral Daily  . HYDROcodone-acetaminophen  5-325 tablet Oral TID AC & HS  . Iron  1 tablet Oral Daily  . loratadine  10 mg Oral Daily  . multivitamin  1 tablet Oral Daily  . pantoprazole  40 mg Oral Daily  . pyridOXINE  100 mg Oral Daily  . rosuvastatin  20 mg Oral Daily  . vitamin B-12  1,000 mcg Oral Daily    Assessment:  89 YOF w/ PMH of HTN, HLD, MVP presenting to [facility] on 09/13/14 c/o CP.  CXR revealed no acute abnormality, CE up.  Pharmacy has been consulted to dose heparin in the setting of ACS/STEMI.    Trop up 0.79, Hgb wnl, hct low 35.6, plt wnl.   Scr 0.94, CrCl ~30-35 mL/min Plan for cath lab 09/14/14.  No PTA anticoagulants.  Goal of Therapy:  Heparin level 0.3-0.7 units/ml Monitor platelets by anticoagulation protocol: Yes   Plan:  - Heparin bolus 3500 units x 1 - Heparin 700 units/hr IV infusion - Heparin level 8 hours after start of infusion - Daily CBC and heparin level - Monitor for signs and symptoms of bleeding  Hassie Bruce, Pharm. D. Clinical Pharmacy Resident Pager: [phone] Ph: [phone] 09/13/2014 4:24 PM

## 2014-09-13 NOTE — H&P (Signed)
History and Physical   Patient ID: [name] MRN: [id number], DOB/AGE: 1922/06/11 81 y.o. Date of Encounter: 09/13/2014  Primary Physician: Horatio Pel, MD Primary Cardiologist: Dr. Claiborne Billings  Chief Complaint:  NSTEMI  HPI: [name] is a 81 y.o. female with no history of CAD. She has a history of HTN, HLD, MVP.   She woke at 4:30 am today with midsternal dull aching in her chest. Felt it was indigestion, 2/10, burping helped. No associated symptoms, no radiation. She took no medications. Symptoms improved after burping, in about 5 minutes, she was able to sleep.   She had onset of squeezing midsternal chest pain at 8:30 am, while making breakfast. She had SOB, but no N&V, diaphoresis. 5/10, no radiation. She took no medications. Symptoms lasted about 5 minutes. She still "didn't feel right", but no specific complaints except maybe a little lightheaded. She called the RN, who took a BP and said her SBP was 80s. EMS was called, by their arrival, she was asymptomatic and has remained so. She was given ASA 81 mg x 4. She is no longer light-headed.   She has never had chest pain before. She has noticed increasing DOE, with walking extended distances, but no chest pain.   No other ongoing issues, she is in her usual state of health.   Past Medical History  Diagnosis Date  . Hypertension   . Hyperlipidemia   . GERD (gastroesophageal reflux disease)   . Anxiety   . Mitral prolapse   . Degenerative arthritis   . Neuropathy   . Peripheral neuropathy     Small fiber   . Diverticulosis   . Mild renal insufficiency   . Hx of echocardiogram 07/30/2012    EF 55-60%; mild LVH; mild AV stenosis & trivial regurg.; mod MV regurg.; LA severly dilated; RV systolic pressure increased consistent with severe pulm. hypertension; RA moderately dilated; mod TV regurg.;   . History of nuclear stress test 09/19/2007    normal pattern of perfusion; post-stress EF 86%; EKG  negative for ischemia; low risk scan  . Carotid arterial disease 07/30/2012    carotid doppler; normal study  . Foot fracture, left   . Memory disorder 03/05/2014    Surgical History:  Past Surgical History  Procedure Laterality Date  . Abdominal hysterectomy    . Replacement total knee Right   . Tear duct probing with strabismus repair      Tear duct repair surgery  . Gallbladder resection    . Tonsillectomy    . Appendectomy    . Resuspension procedure    .  bilateral bunionectomies    . Foot fracture surgery Left   . Dilation and curettage of uterus    . Umbilical hernia repair       I have reviewed the patient's current medications. Medication Sig  amLODipine (NORVASC) 5 MG tablet Take 5 mg by mouth daily.   aspirin EC 81 MG tablet Take 81 mg by mouth daily.    atenolol (TENORMIN) 25 MG tablet 12.5 mg 2 (two) times daily.  atorvastatin (LIPITOR) 20 MG tablet Take 1 tablet by mouth daily.  benazepril (LOTENSIN) 40 MG tablet Take 1 tablet (40 mg total) by mouth daily.  Calcium Carbonate-Vitamin D (CALTRATE 600+D) 600-400 MG-UNIT per tablet Take 1 tablet by mouth daily.   cetirizine (ZYRTEC) 10 MG tablet Take 10 mg by mouth daily.  cholecalciferol (VITAMIN D) 1000 UNITS tablet Take 1,000 Units by mouth daily.  colestipol (COLESTID) 1 G tablet Take 1 g by mouth 2 (two) times daily as needed. For diarrhea   cycloSPORINE (RESTASIS) 0.05 % ophthalmic emulsion Place 1 drop into both eyes daily.    diphenhydrAMINE (BENADRYL) 25 mg capsule Take 25 mg by mouth at bedtime.    fenofibrate micronized (LOFIBRA) 134 MG capsule Take 1 capsule (134 mg total) by mouth daily.  Ferrous Sulfate (IRON) 28 MG TABS Take 1 tablet by mouth daily.  Flaxseed, Linseed, (FLAXSEED OIL) 1000 MG CAPS Take 3 capsules by mouth daily. 2 @ lunch and 1 @ dinner  flunisolide (NASALIDE) 0.025 % SOLN Inhale 1 spray into the lungs daily.    gabapentin (NEURONTIN) 300 MG capsule Take 1 capsule three times daily and  take 3 capsules at bedtime  hydrochlorothiazide (HYDRODIURIL) 25 MG tablet Take 0.5 tablets (12.5 mg total) by mouth 2 (two) times daily. Patient taking differently: Take 25 mg by mouth daily.   HYDROcodone-acetaminophen (NORCO/VICODIN) 5-325 MG per tablet Take 5-325 tablets by mouth every 6 (six) hours.   hydroxypropyl methylcellulose (ISOPTO TEARS) 2.5 % ophthalmic solution Place 1 drop into both eyes as needed for dry eyes.  Lactobacillus (ACIDOPHILUS) 100 MG CAPS Take 100 mg by mouth daily.   lansoprazole (PREVACID) 15 MG capsule Take 15 mg by mouth daily.    Multiple Vitamin (MULTIVITAMIN) tablet Take 1 tablet by mouth daily.    pyridOXINE (VITAMIN B-6) 100 MG tablet Take 100 mg by mouth daily.  rosuvastatin (CRESTOR) 5 MG tablet Take 5 mg by mouth daily.   simethicone (MYLICON) 366 MG chewable tablet Chew 125 mg by mouth as needed for flatulence.  vitamin B-12 (CYANOCOBALAMIN) 1000 MCG tablet Take 1,000 mcg by mouth daily.   Allergies:  Allergies  Allergen Reactions  . Avelox [Moxifloxacin Hcl In Nacl] Shortness Of Breath and Swelling  . Codeine Swelling  . Garlic     Gi problems  . Latex     Watery blisters   . Onion     Gi problems  . Polysporin [Bacitracin-Polymyxin B]   . Sulfa Drugs Cross Reactors Swelling    History   Social History  . Marital Status: Widowed    Spouse Name: N/A    Number of Children: 2  . Years of Education: AS   Occupational History  . Retired    Social History Main Topics  . Smoking status: Former Research scientist (life sciences)  . Smokeless tobacco: Never Used  . Alcohol Use: No  . Drug Use: No  . Sexual Activity: No   Other Topics Concern  . Not on file   Social History Narrative   Patient is left handed, but uses right handed.   Patient drinks one cup caffeine daily.       Family History  Problem Relation Age of Onset  . Pneumonia Mother   . Coronary artery disease Mother   . Heart disease Father   . Cancer Father   . Arthritis Sister   .  Hypertension Mother   . Hypertension Father    Family Status  Relation Status Death Age  . Mother Deceased 4    htn, cad, pnemonia  . Father Deceased 27    htn, heart ds, cancer  . Sister Alive   . Maternal Grandmother Deceased     heart ds  . Paternal Grandmother Deceased     heart ds    Review of Systems:   Full 14-point review of systems otherwise negative except as noted above.  Physical Exam: Blood  pressure 130/50, pulse 51, temperature 97.9 F (36.6 C), temperature source Oral, resp. rate 13, height 5\' 1"  (1.549 m), weight 129 lb (58.514 kg), SpO2 97 %. General: Well developed, well nourished elderly female in no acute distress. Head: Normocephalic, atraumatic, sclera non-icteric, no xanthomas, nares are without discharge. Dentition: edentulous Neck: No carotid bruits. JVD not elevated. No thyromegally Lungs: Good expansion bilaterally. without wheezes or rhonchi.  Heart: Regular rate and rhythm with S1 S2.  No S3 or S4. 3/6 murmur, no rubs, or gallops appreciated. Abdomen: Soft, non-tender, non-distended with normoactive bowel sounds. No hepatomegaly. No rebound/guarding. No obvious abdominal masses. Msk:  Strength and tone appear normal for age. No joint deformities or effusions, no spine or costo-vertebral angle tenderness. Extremities: No clubbing or cyanosis. No edema.  Distal pedal pulses are 2+ in 4 extrem Neuro: Alert and oriented X 3. Moves all extremities spontaneously. No focal deficits noted. Psych:  Responds to questions appropriately with a normal affect. Skin: No rashes or lesions noted  Labs:   Lab Results  Component Value Date   WBC 5.1 09/13/2014   HGB 12.2 09/13/2014   HCT 35.6* 09/13/2014   MCV 89.2 09/13/2014   PLT 224 09/13/2014    Recent Labs Lab 09/13/14 1036  NA 141  K 3.6  CL 106  CO2 28  BUN 24*  CREATININE 0.94  CALCIUM 10.0  GLUCOSE 102*    Recent Labs  09/13/14 1036 09/13/14 1257  TROPONINI 0.23* 0.79*    Recent  Labs  09/13/14 1056  TROPIPOC 0.20*   Radiology/Studies: Dg Chest Port 1 View 09/13/2014   CLINICAL DATA:  Reflux disease an acute chest pain, initial encounter  EXAM: PORTABLE CHEST - 1 VIEW  COMPARISON:  12/21/2013  FINDINGS: Cardiac shadow is stable. Diffuse aortic calcifications are seen. The lungs are well aerated without focal infiltrate or sizable effusion. No bony abnormality is noted.  IMPRESSION: No active disease.   Electronically Signed   By: Inez Catalina M.D.   On: 09/13/2014 11:23   Echo: 11/16/2013 - Left ventricle: The cavity size was normal. Wall thickness was normal. Systolic function was normal. The estimated ejection fraction was in the range of 60% to 65%. Wall motion was normal; there were no regional wall motion abnormalities. Left ventricular diastolic function parameters were normal. - Mitral valve: Calcified annulus. Moderate regurgitation. - Left atrium: The atrium was moderately dilated. - Right atrium: The atrium was mildly dilated. - Tricuspid valve: Severe regurgitation. - Pulmonary arteries: Systolic pressure was moderately to severely increased. PA peak pressure: 66mm Hg (S). Impressions: - Compared to 07/30/12, LV function remains normal and pulmonary pressures are similar; TR now severe; MR appears at least moderate.  ECG: SR, Inferior T wave changes from ECG 05/2014  ASSESSMENT AND PLAN:  Principal Problem:   Non-ST elevation myocardial infarction (NSTEMI), initial care episode - admit, cycle enzymes, add heparin, ASA, make sure is on statin and BB. No nitrates as she is pain-free and may have inferior MI. Plan cath tomorrow, The risks and benefits of a cardiac catheterization including, but not limited to, death, stroke, MI, kidney damage and bleeding were discussed with the patient who indicates understanding and agrees to proceed.   Active Problems:   Essential hypertension - continue medications, discuss change atenolol to  carvedilol with MD, may need to increase Norvasc, could add hydralazine but would hate to increase her med list/cost    Hyperlipidemia - on Crestor 5 mg, ck profile and LFTs  Dyspnea on exertion - CXR without acute abnormality    Mild aortic valve stenosis - and other abnormalities, see below, will recheck echo.   Moderate to severe pulmonary hypertension   Severe tricuspid regurgitation   Moderate mitral regurgitation  Jonetta Speak, PA-C 09/13/2014 3:46 PM Beeper [phone]  Attending Note: Delightful lady from [facility] who has a past history of hypertension and hypercholesterolemia and multivalve disease (mild aortic stenosis, moderate mitral regurgitation by prior echo).  She does not have any prior history of known ischemic heart disease.  She awoke at about 4:30 AM with mild substernal chest tightness.  It lasted only a few minutes and then resolved.  She was able to fall back to sleep.  Then at about 8:30 this morning she had a more severe episode of chest tightness not associated with any radiation or with any diaphoresis nausea or vomiting.  She called the nurse from the facility who came and checked her.  Her blood pressure was apparently low at that time but subsequently has been normal to high here in the emergency room.  At the facility prior to transfer she was given aspirin.  She has been pain-free since then.  Her troponins are mildly elevated and are rising.  Her EKGs show fluctuating ST segment depression in the inferior leads suggestive of inferior wall ischemia.  Physical examination reveals clear lungs.  There is a grade 2/6 basilar systolic ejection murmur which radiates to the carotids.  She also has a grade 2/6 apical systolic murmur.  There is no chest wall tenderness.  The extremities show no phlebitis or edema.  Pedal pulses are present. Chest x-ray personally reviewed shows borderline cardiomegaly with a left ventricular configuration.  Calcification of  the aorta is noted.  No radiographic evidence of congestive heart failure. Plan: Agree with assessment and plan as noted by Rosaria Ferries PAC.  We will begin IV heparin for suspected non-STEMI and plan for cardiac catheterization tomorrow morning.  Continue statin, beta blocker, ACE inhibitor and aspirin.

## 2014-09-13 NOTE — ED Provider Notes (Signed)
CSN: [id number]     Arrival date & time 09/13/14  2536 History   First MD Initiated Contact with Patient 09/13/14 [phone]     Chief Complaint  Patient presents with  . Chest Pain     (Consider location/radiation/quality/duration/timing/severity/associated sxs/prior Treatment) HPI Comments: 81 year old female with history of polyneuropathy, high blood pressure, lipids, pulmonary hypertension, mitral valve flow prolapse presents after episode of severe chest tightness/squeezing prior to arrival lasting proximal to 10 minutes. Patient had episode of chest discomfort upon awakening that resolved and then should more significant episode later on. This is different than previous. Patient has no heart attack or heart failure history, no recent coronary angiogram, follows Dr. Georgina Peer outpatient. Currently no symptoms. No diaphoresis during this event  Patient is a 81 y.o. female presenting with chest pain. The history is provided by the patient.  Chest Pain Associated symptoms: fatigue and shortness of breath   Associated symptoms: no abdominal pain, no back pain, no fever, no headache and not vomiting     Past Medical History  Diagnosis Date  . Hypertension   . Hyperlipidemia   . GERD (gastroesophageal reflux disease)   . Anxiety   . Mitral prolapse   . Degenerative arthritis   . Neuropathy   . Peripheral neuropathy     Small fiber   . Diverticulosis   . Mild renal insufficiency   . Hx of echocardiogram 07/30/2012    EF 55-60%; mild LVH; mild AV stenosis & trivial regurg.; mod MV regurg.; LA severly dilated; RV systolic pressure increased consistent with severe pulm. hypertension; RA moderately dilated; mod TV regurg.;   . History of nuclear stress test 09/19/2007    normal pattern of perfusion; post-stress EF 86%; EKG negative for ischemia; low risk scan  . Carotid arterial disease 07/30/2012    carotid doppler; normal study  . Foot fracture, left   . Memory disorder 03/05/2014  . Chronic  anticoagulation, with coumadin, with PAF and CHADS2Vasc2 score of 4 09/16/2014  . S/P cardiac cath 09/14/14 mild calcification of ostial RCA but otherwise normal coronary arteries 09/14/2014  . Anemia, iron deficiency 09/16/2014   Past Surgical History  Procedure Laterality Date  . Abdominal hysterectomy    . Replacement total knee Right   . Tear duct probing with strabismus repair      Tear duct repair surgery  . Gallbladder resection    . Tonsillectomy    . Appendectomy    . Resuspension procedure    .  bilateral bunionectomies    . Foot fracture surgery Left   . Dilation and curettage of uterus    . Umbilical hernia repair    . Left heart catheterization with coronary angiogram N/A 09/14/2014    Procedure: LEFT HEART CATHETERIZATION WITH CORONARY ANGIOGRAM;  Surgeon: Troy Sine, MD;  Location: [facility] CATH LAB;  Service: Cardiovascular;  Laterality: N/A;   Family History  Problem Relation Age of Onset  . Pneumonia Mother   . Coronary artery disease Mother   . Heart disease Father   . Cancer Father   . Arthritis Sister   . Hypertension Mother   . Hypertension Father    History  Substance Use Topics  . Smoking status: Former Research scientist (life sciences)  . Smokeless tobacco: Never Used  . Alcohol Use: No   OB History    No data available     Review of Systems  Constitutional: Positive for fatigue. Negative for fever and chills.  HENT: Negative for congestion.   Eyes: Negative  for visual disturbance.  Respiratory: Positive for shortness of breath.   Cardiovascular: Positive for chest pain.  Gastrointestinal: Negative for vomiting and abdominal pain.  Genitourinary: Negative for dysuria and flank pain.  Musculoskeletal: Negative for back pain, neck pain and neck stiffness.  Skin: Negative for rash.  Neurological: Negative for light-headedness and headaches.      Allergies  Avelox; Codeine; Garlic; Latex; Onion; Polysporin; and Sulfa drugs cross reactors  Home Medications   Prior to  Admission medications   Medication Sig Start Date End Date Taking? Authorizing Provider  benazepril (LOTENSIN) 40 MG tablet Take 1 tablet (40 mg total) by mouth daily. 03/06/13  Yes Troy Sine, MD  Calcium Carbonate-Vitamin D (CALTRATE 600+D) 600-400 MG-UNIT per tablet Take 1 tablet by mouth daily.    Yes Historical Provider, MD  cetirizine (ZYRTEC) 10 MG tablet Take 10 mg by mouth daily.   Yes Historical Provider, MD  cholecalciferol (VITAMIN D) 1000 UNITS tablet Take 1,000 Units by mouth daily.     Yes Historical Provider, MD  colestipol (COLESTID) 1 G tablet Take 1 g by mouth 2 (two) times daily as needed. For diarrhea    Yes Historical Provider, MD  cycloSPORINE (RESTASIS) 0.05 % ophthalmic emulsion Place 1 drop into both eyes daily.     Yes Historical Provider, MD  diphenhydrAMINE (BENADRYL) 25 mg capsule Take 25 mg by mouth at bedtime.     Yes Historical Provider, MD  fenofibrate micronized (LOFIBRA) 134 MG capsule Take 1 capsule (134 mg total) by mouth daily. 01/01/14  Yes Troy Sine, MD  Flaxseed, Linseed, (FLAXSEED OIL) 1000 MG CAPS Take 3 capsules by mouth daily. 2 @ lunch and 1 @ dinner   Yes Historical Provider, MD  flunisolide (NASALIDE) 0.025 % SOLN Inhale 1 spray into the lungs daily.     Yes Historical Provider, MD  gabapentin (NEURONTIN) 300 MG capsule Take 1 capsule three times daily and take 3 capsules at bedtime 03/15/14  Yes Kathrynn Ducking, MD  hydrochlorothiazide (HYDRODIURIL) 25 MG tablet Take 0.5 tablets (12.5 mg total) by mouth 2 (two) times daily. Patient taking differently: Take 25 mg by mouth daily.  03/02/14  Yes Troy Sine, MD  HYDROcodone-acetaminophen (NORCO/VICODIN) 5-325 MG per tablet Take 1 tablet by mouth every 6 (six) hours.  05/06/13  Yes Historical Provider, MD  hydroxypropyl methylcellulose (ISOPTO TEARS) 2.5 % ophthalmic solution Place 1 drop into both eyes as needed for dry eyes.   Yes Historical Provider, MD  Lactobacillus (ACIDOPHILUS) 100 MG CAPS  Take 100 mg by mouth daily.    Yes Historical Provider, MD  lansoprazole (PREVACID) 15 MG capsule Take 15 mg by mouth daily.     Yes Historical Provider, MD  Multiple Vitamin (MULTIVITAMIN) tablet Take 1 tablet by mouth daily.     Yes Historical Provider, MD  pyridOXINE (VITAMIN B-6) 100 MG tablet Take 100 mg by mouth daily.   Yes Historical Provider, MD  simethicone (MYLICON) 710 MG chewable tablet Chew 125 mg by mouth as needed for flatulence.   Yes Historical Provider, MD  vitamin B-12 (CYANOCOBALAMIN) 1000 MCG tablet Take 1,000 mcg by mouth daily.   Yes Historical Provider, MD  carvedilol (COREG) 6.25 MG tablet Take 1 tablet (6.25 mg total) by mouth 2 (two) times daily with a meal. 09/16/14   Isaiah Serge, NP  diltiazem (CARDIZEM SR) 90 MG 12 hr capsule Take 1 capsule (90 mg total) by mouth every 12 (twelve) hours. 09/16/14   Mickel Baas  Rozell Searing, NP  Ferrous Sulfate (IRON) 28 MG TABS Take 1 tablet (28 mg total) by mouth 2 (two) times daily. 09/16/14   Isaiah Serge, NP  rosuvastatin (CRESTOR) 20 MG tablet Take 1 tablet (20 mg total) by mouth daily at 6 PM. 09/16/14   Isaiah Serge, NP  warfarin (COUMADIN) 5 MG tablet Take 1 tablet (5 mg total) by mouth one time only at 6 PM. 09/16/14   Isaiah Serge, NP   BP 110/63 mmHg  Pulse 67  Temp(Src) 97.8 F (36.6 C) (Oral)  Resp 18  Ht 5\' 1"  (1.549 m)  Wt 125 lb 9.6 oz (56.972 kg)  BMI 23.74 kg/m2  SpO2 98% Physical Exam  Constitutional: She is oriented to person, place, and time. She appears well-developed and well-nourished.  HENT:  Head: Normocephalic and atraumatic.  Eyes: Conjunctivae are normal. Right eye exhibits no discharge. Left eye exhibits no discharge.  Neck: Normal range of motion. Neck supple. No tracheal deviation present.  Cardiovascular: Normal rate, regular rhythm and intact distal pulses.   Murmur (LSB SM 3+) heard. Pulmonary/Chest: Effort normal and breath sounds normal.  Abdominal: Soft. She exhibits no distension. There is  no tenderness. There is no guarding.  Musculoskeletal: She exhibits no edema or tenderness.  Neurological: She is alert and oriented to person, place, and time.  Skin: Skin is warm. No rash noted.  Psychiatric: She has a normal mood and affect.  Nursing note and vitals reviewed.   ED Course  Procedures (including critical care time) CRITICAL CARE Performed by: Mariea Clonts   Total critical care time: 35 min  Critical care time was exclusive of separately billable procedures and treating other patients.  Critical care was necessary to treat or prevent imminent or life-threatening deterioration.  Critical care was time spent personally by me on the following activities: development of treatment plan with patient and/or surrogate as well as nursing, discussions with consultants, evaluation of patient's response to treatment, examination of patient, obtaining history from patient or surrogate, ordering and performing treatments and interventions, ordering and review of laboratory studies, ordering and review of radiographic studies, pulse oximetry and re-evaluation of patient's condition.  Labs Review Labs Reviewed  BASIC METABOLIC PANEL - Abnormal; Notable for the following:    Glucose, Bld 102 (*)    BUN 24 (*)    GFR calc non Af Amer 52 (*)    GFR calc Af Amer 60 (*)    All other components within normal limits  TROPONIN I - Abnormal; Notable for the following:    Troponin I 0.23 (*)    All other components within normal limits  CBC WITH DIFFERENTIAL/PLATELET - Abnormal; Notable for the following:    HCT 35.6 (*)    Eosinophils Relative 7 (*)    All other components within normal limits  TROPONIN I - Abnormal; Notable for the following:    Troponin I 0.79 (*)    All other components within normal limits  COMPREHENSIVE METABOLIC PANEL - Abnormal; Notable for the following:    Glucose, Bld 101 (*)    Alkaline Phosphatase 28 (*)    GFR calc non Af Amer 72 (*)    GFR calc  Af Amer 84 (*)    All other components within normal limits  TROPONIN I - Abnormal; Notable for the following:    Troponin I 1.15 (*)    All other components within normal limits  TROPONIN I - Abnormal; Notable for the following:  Troponin I 1.07 (*)    All other components within normal limits  TROPONIN I - Abnormal; Notable for the following:    Troponin I 0.86 (*)    All other components within normal limits  PROTIME-INR - Abnormal; Notable for the following:    Prothrombin Time 15.3 (*)    All other components within normal limits  CBC - Abnormal; Notable for the following:    RBC 3.48 (*)    Hemoglobin 10.4 (*)    HCT 30.5 (*)    All other components within normal limits  HEPARIN LEVEL (UNFRACTIONATED) - Abnormal; Notable for the following:    Heparin Unfractionated 0.28 (*)    All other components within normal limits  CBC - Abnormal; Notable for the following:    RBC 3.04 (*)    Hemoglobin 9.2 (*)    HCT 26.7 (*)    All other components within normal limits  OCCULT BLOOD X 1 CARD TO LAB, STOOL - Abnormal; Notable for the following:    Fecal Occult Bld POSITIVE (*)    All other components within normal limits  I-STAT TROPOININ, ED - Abnormal; Notable for the following:    Troponin i, poc 0.20 (*)    All other components within normal limits  POCT I-STAT 3, ART BLOOD GAS (G3+) - Abnormal; Notable for the following:    pO2, Arterial 105.0 (*)    Bicarbonate 24.1 (*)    All other components within normal limits  POCT I-STAT 3, VENOUS BLOOD GAS (G3P V) - Abnormal; Notable for the following:    pH, Ven 7.393 (*)    pCO2, Ven 39.8 (*)    Bicarbonate 24.2 (*)    All other components within normal limits  HEPARIN LEVEL (UNFRACTIONATED)  CBC  HEMOGLOBIN A1C  LIPID PANEL  HEPARIN LEVEL (UNFRACTIONATED)  HEPARIN LEVEL (UNFRACTIONATED)  PROTIME-INR  POCT ACTIVATED CLOTTING TIME    Imaging Review No results found.   EKG Interpretation   Date/Time:  Monday September 13 2014 15:15:46 EST Ventricular Rate:  64 PR Interval:  155 QRS Duration: 93 QT Interval:  404 QTC Calculation: 417 R Axis:   139 Text Interpretation:  Sinus rhythm Right axis deviation Borderline  repolarization abnormality Borderline ST elevation, lateral leads Baseline  wander in lead(s) V6 ED PHYSICIAN INTERPRETATION AVAILABLE IN [facility] Confirmed by TEST, Record ([id number]) on 09/15/2014 7:13:21 AM      MDM   Final diagnoses:  Acute chest pain  NSTEMI (non-ST elevated myocardial infarction)   Patient presents after episode significant chest squeezing, with age, high blood pressure and lipid history and EKG changes cardiology paged for further evaluation and likely observation/admission. Patient asked him prior to arrival. Currently symptom-free except for general fatigue.  Cardiology consulted and will see patient in the ED> Spoke with cardiology twice more during ED visit, they were busy with critical patient and evaluated afterwards.   The patients results and plan were reviewed and discussed.   Any x-rays performed were personally reviewed by myself.   Differential diagnosis were considered with the presenting HPI.  Medications  aspirin chewable tablet 324 mg (324 mg Oral Given 09/14/14 0619)  heparin bolus via infusion 3,500 Units (0 Units Intravenous Stopped 09/13/14 1641)  midazolam (VERSED) 2 MG/2ML injection (not administered)  fentaNYL (SUBLIMAZE) 0.05 MG/ML injection (not administered)  lidocaine (PF) (XYLOCAINE) 1 % injection (not administered)  heparin 2-0.9 UNIT/ML-% infusion (not administered)  midazolam (VERSED) 2 MG/2ML injection (not administered)  warfarin (COUMADIN) tablet 5  mg (5 mg Oral Given 09/15/14 1752)  coumadin book ( Does not apply Given 09/15/14 1315)  warfarin (COUMADIN) video ( Does not apply Given 09/15/14 1315)  off the beat book ( Does not apply Given 09/15/14 2030)    Filed Vitals:   09/15/14 1751 09/15/14 1948 09/16/14 0459 09/16/14  0819  BP:  123/37 119/44 110/63  Pulse: 62 62 60 67  Temp:  97.6 F (36.4 C) 97.9 F (36.6 C) 97.8 F (36.6 C)  TempSrc:  Oral Oral Oral  Resp:  18 18 18   Height:      Weight:   125 lb 9.6 oz (56.972 kg)   SpO2:  99% 96% 98%    Final diagnoses:  Acute chest pain  NSTEMI (non-ST elevated myocardial infarction)    Admission/ observation were discussed with the admitting physician, patient and/or family and they are comfortable with the plan.      Mariea Clonts, MD 09/18/14 [phone]

## 2014-09-14 ENCOUNTER — Encounter (HOSPITAL_COMMUNITY): Payer: Self-pay | Admitting: Cardiovascular Disease

## 2014-09-14 ENCOUNTER — Encounter (HOSPITAL_COMMUNITY)
Admission: EM | Disposition: A | Discharge status: Home / Self Care | Payer: Self-pay | Source: Home / Self Care | Attending: Cardiology

## 2014-09-14 DIAGNOSIS — I340 Nonrheumatic mitral (valve) insufficiency: Secondary | ICD-10-CM

## 2014-09-14 DIAGNOSIS — I214 Non-ST elevation (NSTEMI) myocardial infarction: Principal | ICD-10-CM

## 2014-09-14 DIAGNOSIS — I350 Nonrheumatic aortic (valve) stenosis: Secondary | ICD-10-CM

## 2014-09-14 DIAGNOSIS — E785 Hyperlipidemia, unspecified: Secondary | ICD-10-CM

## 2014-09-14 DIAGNOSIS — R0609 Other forms of dyspnea: Secondary | ICD-10-CM

## 2014-09-14 DIAGNOSIS — I10 Essential (primary) hypertension: Secondary | ICD-10-CM

## 2014-09-14 HISTORY — PX: LEFT HEART CATHETERIZATION WITH CORONARY ANGIOGRAM: SHX5451

## 2014-09-14 LAB — CBC
HCT: 36 % (ref 36.0–46.0)
Hemoglobin: 12.3 g/dL (ref 12.0–15.0)
MCH: 30.4 pg (ref 26.0–34.0)
MCHC: 34.2 g/dL (ref 30.0–36.0)
MCV: 88.9 fL (ref 78.0–100.0)
Platelets: 233 10*3/uL (ref 150–400)
RBC: 4.05 MIL/uL (ref 3.87–5.11)
RDW: 13.2 % (ref 11.5–15.5)
WBC: 5 10*3/uL (ref 4.0–10.5)

## 2014-09-14 LAB — COMPREHENSIVE METABOLIC PANEL
ALT: 16 U/L (ref 0–35)
AST: 33 U/L (ref 0–37)
Albumin: 4 g/dL (ref 3.5–5.2)
Alkaline Phosphatase: 28 U/L — ABNORMAL LOW (ref 39–117)
Anion gap: 9 (ref 5–15)
BUN: 23 mg/dL (ref 6–23)
CO2: 28 mmol/L (ref 19–32)
Calcium: 10.2 mg/dL (ref 8.4–10.5)
Chloride: 105 mmol/L (ref 96–112)
Creatinine, Ser: 0.75 mg/dL (ref 0.50–1.10)
GFR calc Af Amer: 84 mL/min — ABNORMAL LOW (ref >90)
GFR calc non Af Amer: 72 mL/min — ABNORMAL LOW (ref >90)
Glucose, Bld: 101 mg/dL — ABNORMAL HIGH (ref 70–99)
Potassium: 3.5 mmol/L (ref 3.5–5.1)
Sodium: 142 mmol/L (ref 135–145)
Total Bilirubin: 0.7 mg/dL (ref 0.3–1.2)
Total Protein: 7 g/dL (ref 6.0–8.3)

## 2014-09-14 LAB — POCT I-STAT 3, ART BLOOD GAS (G3+)
Acid-base deficit: 1 mmol/L (ref 0.0–2.0)
Bicarbonate: 24.1 mEq/L — ABNORMAL HIGH (ref 20.0–24.0)
O2 Saturation: 98 %
TCO2: 25 mmol/L (ref 0–100)
pCO2 arterial: 41.6 mmHg (ref 35.0–45.0)
pH, Arterial: 7.371 (ref 7.350–7.450)
pO2, Arterial: 105 mmHg — ABNORMAL HIGH (ref 80.0–100.0)

## 2014-09-14 LAB — POCT I-STAT 3, VENOUS BLOOD GAS (G3P V)
Acid-base deficit: 1 mmol/L (ref 0.0–2.0)
Bicarbonate: 24.2 mEq/L — ABNORMAL HIGH (ref 20.0–24.0)
O2 Saturation: 64 %
TCO2: 25 mmol/L (ref 0–100)
pCO2, Ven: 39.8 mmHg — ABNORMAL LOW (ref 45.0–50.0)
pH, Ven: 7.393 — ABNORMAL HIGH (ref 7.250–7.300)
pO2, Ven: 34 mmHg (ref 30.0–45.0)

## 2014-09-14 LAB — LIPID PANEL
Cholesterol: 136 mg/dL (ref 0–200)
HDL: 50 mg/dL (ref >39)
LDL Cholesterol: 62 mg/dL (ref 0–99)
Total CHOL/HDL Ratio: 2.7 RATIO
Triglycerides: 119 mg/dL (ref <150)
VLDL: 24 mg/dL (ref 0–40)

## 2014-09-14 LAB — TROPONIN I
Troponin I: 1.07 ng/mL (ref <0.031)
Troponin I: 0.86 ng/mL (ref <0.031)

## 2014-09-14 LAB — POCT ACTIVATED CLOTTING TIME: Activated Clotting Time: 91 seconds

## 2014-09-14 LAB — HEPARIN LEVEL (UNFRACTIONATED): Heparin Unfractionated: 0.36 IU/mL (ref 0.30–0.70)

## 2014-09-14 LAB — HEMOGLOBIN A1C
Hgb A1c MFr Bld: 5.6 % (ref <5.7)
Mean Plasma Glucose: 114 mg/dL (ref <117)

## 2014-09-14 SURGERY — LEFT HEART CATHETERIZATION WITH CORONARY ANGIOGRAM
Anesthesia: LOCAL

## 2014-09-14 MED ORDER — ONDANSETRON HCL 4 MG/2ML IJ SOLN: 4.0000 mg | Freq: Four times a day (QID) | INTRAMUSCULAR | Status: DC | PRN

## 2014-09-14 MED ORDER — ASPIRIN EC 81 MG PO TBEC
81.0000 mg | DELAYED_RELEASE_TABLET | Freq: Every day | ORAL | Status: DC
  Administered 2014-09-15 – 2014-09-16 (×2): 81 mg via ORAL
  Filled 2014-09-14 (×2): qty 1

## 2014-09-14 MED ORDER — MIDAZOLAM HCL 2 MG/2ML IJ SOLN
INTRAMUSCULAR | Status: AC
  Filled 2014-09-14: qty 2

## 2014-09-14 MED ORDER — HEPARIN SODIUM (PORCINE) 5000 UNIT/ML IJ SOLN: 5000.0000 [IU] | Freq: Three times a day (TID) | INTRAMUSCULAR | Status: DC

## 2014-09-14 MED ORDER — ACETAMINOPHEN 325 MG PO TABS: 650.0000 mg | ORAL_TABLET | ORAL | Status: DC | PRN

## 2014-09-14 MED ORDER — ROSUVASTATIN CALCIUM 10 MG PO TABS
20.0000 mg | ORAL_TABLET | Freq: Every day | ORAL | Status: DC
  Administered 2014-09-14 – 2014-09-15 (×2): 20 mg via ORAL
  Filled 2014-09-14 (×2): qty 2

## 2014-09-14 MED ORDER — MIDAZOLAM HCL 2 MG/2ML IJ SOLN
INTRAMUSCULAR | Status: AC
Start: 2014-09-14 — End: 2014-09-14
  Filled 2014-09-14: qty 2

## 2014-09-14 MED ORDER — FENTANYL CITRATE 0.05 MG/ML IJ SOLN
INTRAMUSCULAR | Status: AC
  Filled 2014-09-14: qty 2

## 2014-09-14 MED ORDER — SODIUM CHLORIDE 0.9 % IV SOLN
INTRAVENOUS | Status: DC
  Administered 2014-09-14: 10:00:00 via INTRAVENOUS

## 2014-09-14 MED ORDER — HEPARIN (PORCINE) IN NACL 2-0.9 UNIT/ML-% IJ SOLN
INTRAMUSCULAR | Status: AC
  Filled 2014-09-14: qty 1500

## 2014-09-14 MED ORDER — LIDOCAINE HCL (PF) 1 % IJ SOLN
INTRAMUSCULAR | Status: AC
  Filled 2014-09-14: qty 30

## 2014-09-14 NOTE — Progress Notes (Signed)
[place] for Heparin Indication: ACS/STEMI  Allergies  Allergen Reactions  . Avelox [Moxifloxacin Hcl In Nacl] Shortness Of Breath and Swelling  . Codeine Swelling  . Garlic     Gi problems  . Latex     Watery blisters   . Onion     Gi problems  . Polysporin [Bacitracin-Polymyxin B]   . Sulfa Drugs Cross Reactors Swelling    Patient Measurements: Height: 5\' 1"  (154.9 cm) Weight: 129 lb (58.514 kg) IBW/kg (Calculated) : 47.8  Vital Signs: Temp: 98.1 F (36.7 C) (01/25 2026) Temp Source: Oral (01/25 2026) BP: 141/50 mmHg (01/25 2026) Pulse Rate: 61 (01/25 2026)  Labs:  Recent Labs  09/13/14 1036 09/13/14 1257 09/13/14 1830 09/13/14 2230 09/14/14 0030  HGB 12.2  --   --   --   --   HCT 35.6*  --   --   --   --   PLT 224  --   --   --   --   LABPROT  --   --  15.3*  --   --   INR  --   --  1.20  --   --   HEPARINUNFRC  --   --   --   --  0.36  CREATININE 0.94  --   --   --   --   TROPONINI 0.23* 0.79* 1.15* 1.07*  --     Estimated Creatinine Clearance: 32.7 mL/min (by C-G formula based on Cr of 0.94).  Assessment: 81 y.o. female with chest pain for heparin  Goal of Therapy:  Heparin level 0.3-0.7 units/ml Monitor platelets by anticoagulation protocol: Yes   Plan:  Continue Heparin at current rate  F/U after cath  Phillis Knack, PharmD, BCPS  09/14/2014 1:27 AM

## 2014-09-14 NOTE — CV Procedure (Signed)
[name] is a 81 y.o. female   [id number]  [id number] LOCATION:  FACILITY: [place]  PHYSICIAN: Troy Sine, MD, [facility] 1922/03/30   DATE OF PROCEDURE:  09/14/2014      RIGHT AND LEFT HEART CARDIAC CATHETERIZATION   HISTORY:  [name] is a 81 y.o. female who remains active and appears younger than her stated age.  She has a history of hypertension, hyperlipidemia, and previous echocardiographic documentation of possible mild aortic stenosis, significant mitral annular calcification with at least moderate MR, moderately severe tricuspid regurgitation with previous documentation of elevated PA pressures estimated at 66 mm.  She was admitted to the hospital with chest tightness.  Troponins were mildly positive suggestive of a possible non-ST segment elevation MI.  She presents for cardiac catheterization.  Due to her valvular issues as well as previous suggestion of severe pulmonary hypertension and her prior history of shortness of breath a right and left heart catheterization will be performed.   PROCEDURE:  Right and left heart catheterization: Swan-Ganz catheterization, cardiac output determination by the assumed Fick and thermodilution methods; coronary angiography, left ventriculography.  The patient was brought to the second floor [facility] cath lab in the postabsorptive state. Versed 1 mg and fentanyl 25 mcg were initially administered for conscious sedation. The right groin was prepped and draped in sterile fashion and a 5 [place] arterial sheath and 7 French venous sheath were inserted without difficulty. A Swan-Ganz catheter was advanced into the venous sheath and pressures were obtained in the right atrium, right ventricle, but despite wire support the initial Luiz Blare was unable to navigate into the pulmonary artery.  An S shaped Swan catheter was necessary to navigate the catheter from the RV into the PA to obtain PA and PCWP recordings. Cardiac outputs were  obtained by the thermodilution and assumed Fick methods. Oxygen saturation was obtained in the pulmonary artery and aorta. A pigtail catheter was inserted and simultaneous AO/PA pressures were recorded. The pigtail catheter was advanced into the left ventricle and simultaneous left ventricular and PCW pressures were recorded. Left ventriculography was performed in the RAO projection.  A left ventricle to aorta pullback was performed. The pigtail catheter was then removed and diagnostic catheterization to delineate the coronary anatomy was performed utilizing 5 French Judkins 4 left and right diagnostic catheters. All catheters were removed and the patient. Hemostasis was obtained by direct manual pressure. The patient tolerated the procedure well and returned to his room in satisfactory condition.   HEMODYNAMICS:   RA: a 6 mean 3 RV: 30/7 PA: 30/9 PC: a 10  V 14 mean 10  AO: 161/57 PA: 33 12  LV: 152/11 PC: 10  LV: 151/11 AO: 151/50  Oxygen saturation in the aorta 98% and the pulmonary artery 64%  Cardiac output: 3.3 l/min (Thermo); 3.7 (Fick)  Cardiac index: 2.1 l/m/m2                2.3  ANGIOGRAPHY:   Fluoroscopy revealed severe mitral annular calcification and very mild calcification in the region of the aortic valve  Left main: Normal vessel which trifurcated into an LAD, and intermediate vessel, and a small left circumflex system.  LAD: Angiographically normal vessel which gave rise to a proximal bifurcating diagonal branch.  There was a mild region of an intramyocardial LAD segment without systolic bridging.  The LAD rise to several septal perforating arteries and extended to the apex.  Ramus intermediate: Angiographically normal small vessel.   Left circumflex:  Angiographically normal small vessel which gave rise to one marginal branch.   Right coronary artery: There was evidence for calcification in the region of the RCA ostium without stenosis.  The RCA was a dominant  vessel which was free of significant disease and gave rise to PDA and PLA branch.  Left ventriculography  revealed hyperdynamic LV function with an ejection fraction of approximately 70% with near mid cavity obliteration.  There was severe mitral annular calcification with 3+ mitral regurgitation.   IMPRESSION:  Hyperdynamic LV function with an ejection fraction of approximately 70%.  Severe mitral annular calcification with 3+ mitral regurgitation.  Mild calcification in the region of the RCA ostium, but otherwise normal-appearing coronary arteries.  Borderline/mild pulmonary hypertension.   RECOMMENDATION:  Medical therapy.   Troy Sine, MD, [facility] 09/14/2014 9:37 AM

## 2014-09-14 NOTE — Progress Notes (Signed)
CSW Armed forces technical officer) spoke with [facility]. They confirmed pt is from [place]. CSW notified RNCM. Pt has no social work needs at this time. CSW will follow up if new needs arise.  [place], [place]

## 2014-09-14 NOTE — Interval H&P Note (Signed)
Cath Lab Visit (complete for each Cath Lab visit)  Clinical Evaluation Leading to the Procedure:   ACS: Yes.    Non-ACS:    Anginal Classification: CCS III  Anti-ischemic medical therapy: Maximal Therapy (2 or more classes of medications)  Non-Invasive Test Results: No non-invasive testing performed  Prior CABG: No previous CABG      History and Physical Interval Note:  09/14/2014 7:40 AM  [name]  has presented today for surgery, with the diagnosis of chest pain  The various methods of treatment have been discussed with the patient and family. After consideration of risks, benefits and other options for treatment, the patient has consented to  Procedure(s): LEFT HEART CATHETERIZATION WITH CORONARY ANGIOGRAM (N/A) as a surgical intervention .  The patient's history has been reviewed, patient examined, no change in status, stable for surgery.  I have reviewed the patient's chart and labs.  Questions were answered to the patient's satisfaction.     Dewitt Judice A

## 2014-09-14 NOTE — Care Management Note (Addendum)
    Page 1 of 1   09/16/2014     2:45:29 PM CARE MANAGEMENT NOTE 09/16/2014  Patient:  [name], [name]   Account Number:  [id number]  Date Initiated:  09/14/2014  Documentation initiated by:  GRAVES-BIGELOW,Ellisha Bankson  Subjective/Objective Assessment:   Pt admitted for cp and increased troponin- Nstemi.     Action/Plan:   Pt is from [facility]. CSW to assist with disposition needs.   Anticipated DC Date:  09/15/2014   Anticipated DC Plan:  SKILLED NURSING FACILITY  In-house referral  Clinical Social Worker      DC Planning Services  CM consult      Choice offered to / List presented to:             Status of service:  Completed, signed off Medicare Important Message given?  YES (If response is "NO", the following Medicare IM given date fields will be blank) Date Medicare IM given:  09/16/2014 Medicare IM given by:  GRAVES-BIGELOW,Keiona Jenison Date Additional Medicare IM given:   Additional Medicare IM given by:    Discharge Disposition:  [place]  Per UR Regulation:  Reviewed for med. necessity/level of care/duration of stay  If discussed at [place] of Stay Meetings, dates discussed:    Comments:

## 2014-09-14 NOTE — Progress Notes (Signed)
c 

## 2014-09-14 NOTE — Progress Notes (Signed)
Site area: rt groin Site Prior to Removal:  Level  0 Pressure Applied For: 30 minutes Manual:   yes Patient Status During Pull:  stable Post Pull Site:  Level  0 Post Pull Instructions Given:  yes Post Pull Pulses Present: yes Dressing Applied:  tegaderm Bedrest begins @ 6767 Comments: 0 complications

## 2014-09-15 DIAGNOSIS — I270 Primary pulmonary hypertension: Secondary | ICD-10-CM

## 2014-09-15 DIAGNOSIS — I071 Rheumatic tricuspid insufficiency: Secondary | ICD-10-CM

## 2014-09-15 DIAGNOSIS — I4891 Unspecified atrial fibrillation: Secondary | ICD-10-CM

## 2014-09-15 LAB — HEPARIN LEVEL (UNFRACTIONATED)
Heparin Unfractionated: 0.28 IU/mL — ABNORMAL LOW (ref 0.30–0.70)
Heparin Unfractionated: 0.56 IU/mL (ref 0.30–0.70)

## 2014-09-15 LAB — CBC
HCT: 30.5 % — ABNORMAL LOW (ref 36.0–46.0)
Hemoglobin: 10.4 g/dL — ABNORMAL LOW (ref 12.0–15.0)
MCH: 29.9 pg (ref 26.0–34.0)
MCHC: 34.1 g/dL (ref 30.0–36.0)
MCV: 87.6 fL (ref 78.0–100.0)
Platelets: 206 10*3/uL (ref 150–400)
RBC: 3.48 MIL/uL — ABNORMAL LOW (ref 3.87–5.11)
RDW: 13.4 % (ref 11.5–15.5)
WBC: 6.1 10*3/uL (ref 4.0–10.5)

## 2014-09-15 MED ORDER — COUMADIN BOOK
Freq: Once | Status: AC
  Administered 2014-09-15: 13:00:00
  Filled 2014-09-15: qty 1

## 2014-09-15 MED ORDER — WARFARIN - PHARMACIST DOSING INPATIENT
Freq: Every day | Status: DC
Start: 2014-09-15 — End: 2014-09-16
  Administered 2014-09-15: 18:00:00

## 2014-09-15 MED ORDER — HEPARIN (PORCINE) IN NACL 100-0.45 UNIT/ML-% IJ SOLN
850.0000 [IU]/h | INTRAMUSCULAR | Status: DC
  Administered 2014-09-15: 800 [IU]/h via INTRAVENOUS
  Filled 2014-09-15: qty 250

## 2014-09-15 MED ORDER — DILTIAZEM HCL 100 MG IV SOLR
5.0000 mg/h | INTRAVENOUS | Status: DC
  Administered 2014-09-15: 5 mg/h via INTRAVENOUS

## 2014-09-15 MED ORDER — DILTIAZEM HCL 60 MG PO TABS: 120.0000 mg | ORAL_TABLET | Freq: Two times a day (BID) | ORAL | Status: DC

## 2014-09-15 MED ORDER — WARFARIN VIDEO
Freq: Once | Status: AC
  Administered 2014-09-15: 13:00:00

## 2014-09-15 MED ORDER — OFF THE BEAT BOOK
Freq: Once | Status: AC
  Administered 2014-09-15: 21:00:00
  Filled 2014-09-15: qty 1

## 2014-09-15 MED ORDER — DILTIAZEM HCL ER 90 MG PO CP12
90.0000 mg | ORAL_CAPSULE | Freq: Two times a day (BID) | ORAL | Status: DC
  Administered 2014-09-15 – 2014-09-16 (×3): 90 mg via ORAL
  Filled 2014-09-15 (×5): qty 1

## 2014-09-15 MED ORDER — WARFARIN SODIUM 5 MG PO TABS
5.0000 mg | ORAL_TABLET | Freq: Once | ORAL | Status: AC
  Administered 2014-09-15: 5 mg via ORAL
  Filled 2014-09-15: qty 1

## 2014-09-15 NOTE — Progress Notes (Signed)
81 y.o. female with no history of CAD. She has a history of HTN, HLD, MVP.  Admitted 09/13/14 after awaking @ ~0430 with midsternal dull aching in her chest - lasted ~5 min. Recurred @ ~0830 --> ER.  Ruled in for mild NSTEMI with Troponin >1.  Had noted increasing DOE. No sensation of palpitations.  Cardiac Cath 1/26 - no notable CAD.  Mild Pulm HTN - reduced CO/CI despite hyperdynamic EF. 3+ MR.  Subjective:  New onset AF occurred overnight while ambulating from the bathroom. She developed SOB and chest discomfort consistent with her presenting symptoms. ECG AF with RVR, rates 150s, diffuse ST depressions. Started on Dilt gtt.  Spontaneous conversion to NSR ~0630. Feels much better now that she is no longer in A. Fib; she says that the symptoms she had when she was in atrial fibrillation was the exact same symptoms that she had that led to her admission.  Objective:  Vital Signs in the last 24 hours: Temp:  [97.5 F (36.4 C)-97.9 F (36.6 C)] 97.9 F (36.6 C) (01/27 0404) Pulse Rate:  [50-80] 79 (01/27 0404) Resp:  [11-19] 16 (01/27 0404) BP: (101-147)/(47-107) 101/56 mmHg (01/27 0623) SpO2:  [93 %-100 %] 98 % (01/27 0404) Weight:  [118 lb 4.8 oz (53.661 kg)] 118 lb 4.8 oz (53.661 kg) (01/27 0559)  Intake/Output from previous day: 01/26 0701 - 01/27 0700 In: 682.5 [I.V.:682.5] Out: 1125 [Urine:1125] Intake/Output from this shift:    Physical Exam: General appearance: alert, cooperative, appears stated age and no distress Neck: no adenopathy, no carotid bruit and pulsatile V waves of JVP due to TR. Lungs: clear to auscultation bilaterally and normal percussion bilaterally Heart: regular rate and rhythm and normal S1/S2, 3/6 HSM @ [facility], no R/G Abdomen: soft, non-tender; bowel sounds normal; no masses,  no organomegaly Extremities: extremities normal, atraumatic, no cyanosis or edema Pulses: 2+ and symmetric Neurologic: Grossly normal  Lab Results:  Recent Labs  09/14/14 0501 09/15/14 0435  WBC 5.0 6.1  HGB 12.3 10.4*  PLT 233 206    Recent Labs  09/13/14 1036 09/14/14 0501  NA 141 142  K 3.6 3.5  CL 106 105  CO2 28 28  GLUCOSE 102* 101*  BUN 24* 23  CREATININE 0.94 0.75    Recent Labs  09/13/14 2230 09/14/14 0501  TROPONINI 1.07* 0.86*   Hepatic Function Panel  Recent Labs  09/14/14 0501  PROT 7.0  ALBUMIN 4.0  AST 33  ALT 16  ALKPHOS 28*  BILITOT 0.7    Recent Labs  09/14/14 0501  CHOL 136   No results for input(s): PROTIME in the last 72 hours.  Imaging: No new studies  Cardiac Studies: Cardiac CATH reviewed from 1/26 - Summary RA: a 6 mean 3; RV: 30/7 PA: 30/9; PC: a 10 V 14 mean 10 ==> Borderline/ Mild Pulm HTN  AO: 161/57; PA: 33 12  LV: 152/11/10 --> AO: 151/50 (no AS) 3+ MR, EF ~70%; Severe MAC  Oxygen saturation in the aorta 98% and the pulmonary artery 64%  Cardiac output: 3.3 l/min (Thermo); 3.7 (Fick)  Cardiac index: 2.1 l/m/m2 2.3  Mild calcification in the region of the RCA ostium, but otherwise normal-appearing coronary arteries.  Assessment/Plan:  Principal Problem:   Non-ST elevation myocardial infarction (NSTEMI), initial care episode Active Problems:   Mild aortic valve stenosis   Moderate to severe pulmonary hypertension   Essential hypertension   Hyperlipidemia   Dyspnea on exertion   Severe tricuspid regurgitation  Moderate mitral regurgitation   NSTEMI - Angiographically normal Coronaries - no culprit lesion.  3+ MR but only Mild Pulm HTN noted  With new onset of Afib this AM - ? If her + Troponin could have been related to PAF medicated Demand Ischemia in setting of significant valvular disease.  IV Heparin was restarted to complete 48 hr course post cath (just in case of small vessel infarct) -- wllcontinue for now until we determine appropriate anticoagulation options for Afib.  New Onset Afib - converted with IV Diltiazem mediated rate control -->  will d/c Amlodipine & convert to BID Diltiazem (90 mg BID; CHA2DS2VASC Score = 4 (Age, Sex, HTN); HAS Bled Score 3 (on ASA)  She does have significant valvular disease -- although not technically Rheumatic valve disease, would probably be most appropriate to use Warfarin for anticoagulation (will defer to Dr. Claiborne Billings to consider NOAC as OP) -- do not need to bridge (but will take advantage of IV Heparin already being used)  For now, with borderline pressures, I am reluctant to increase Coreg dose - prefer 2nd agent   HTN: BP actually borderline; d/c Amlodipine in lieu of Diltiazem (avoid 2 CCBs), may need to consider backing off ACE-I -= or stopping HCTZ.  On statin   PPI for GI prophylaxis   Valvular Heart Disease / Pulm HTN - by RHC, PA pressures & PCWP not overly elevated.  CO/CI are reduced - does not correlate with EF (probably off due to Valvular disease).  She does not appear to be volume overloaded.  Given her age & relative lack of Sx prior to this admission, Medical Rx has been appropriate.  Would defer to primary cardiologist to determine if surgical intervention is to be considered.  Start Warfarin tonite -- will be on Heparin to complete 48 hrs.   Monitor HR / Rhythm on Tele   Anticipate d/c in 1-2 days depending on recurrence of Afib.   LOS: 2 days    HARDING, DAVID W 09/15/2014, 7:19 AM

## 2014-09-15 NOTE — Progress Notes (Signed)
NSR with HR 60s.  Cardizem gtt titrated down to 5mg /hr.  Continue cardizem gtt per MD order.  Will continue to monitor.  [name]

## 2014-09-15 NOTE — Progress Notes (Signed)
Patient converted to NSR on diltiazem gtt. She went into a-fib despite on coreg. Morning team to decide whether to increase coreg dose before D/C diltiazem gtt  Hilbert Corrigan PA Pager: [phone]

## 2014-09-15 NOTE — Significant Event (Signed)
New onset AF occurred overnight while ambulating from the bathroom. She developed SOB and chest discomfort consistent with her presenting symptoms.  ECG AF with RVR, rates 150s, diffuse ST depressions.  Will plan to start dilt and heparin drips for now.

## 2014-09-15 NOTE — Progress Notes (Signed)
Converted to SR with intermittent bursts of A-fib/RVR with HR up to 150s-170s.  Cardizem infusing at 7.5 mg/mL.  Will continue to monitor.  Jodell Cipro

## 2014-09-15 NOTE — Progress Notes (Signed)
[place] for Heparin, coumadin Indication: Afib  Allergies  Allergen Reactions  . Avelox [Moxifloxacin Hcl In Nacl] Shortness Of Breath and Swelling  . Codeine Swelling  . Garlic     Gi problems  . Latex     Watery blisters   . Onion     Gi problems  . Polysporin [Bacitracin-Polymyxin B]   . Sulfa Drugs Cross Reactors Swelling    Patient Measurements: Height: 5\' 1"  (154.9 cm) Weight: 118 lb 4.8 oz (53.661 kg) IBW/kg (Calculated) : 47.8  Vital Signs: Temp: 97.9 F (36.6 C) (01/27 0404) Temp Source: Oral (01/27 0404) BP: 125/45 mmHg (01/27 0819) Pulse Rate: 79 (01/27 0404)  Labs:  Recent Labs  09/13/14 1036  09/13/14 1830 09/13/14 2230 09/14/14 0030 09/14/14 0501 09/15/14 0435 09/15/14 1214  HGB 12.2  --   --   --   --  12.3 10.4*  --   HCT 35.6*  --   --   --   --  36.0 30.5*  --   PLT 224  --   --   --   --  233 206  --   LABPROT  --   --  15.3*  --   --   --   --   --   INR  --   --  1.20  --   --   --   --   --   HEPARINUNFRC  --   --   --   --  0.36  --   --  0.28*  CREATININE 0.94  --   --   --   --  0.75  --   --   TROPONINI 0.23*  < > 1.15* 1.07*  --  0.86*  --   --   < > = values in this interval not displayed.  Estimated Creatinine Clearance: 35.3 mL/min (by C-G formula based on Cr of 0.75).  Assessment: 81 y.o. female with Afib on heparin.  Heparin level slightly less than goal.  To start coumadin today Goal of Therapy:  INR 2-3 Heparin level 0.3-0.7 units/ml Monitor platelets by anticoagulation protocol: Yes   Plan:  Increase heparin to 850 units/hr Check heparin level in 8 hours.  Coumadin 5 mg po today Daily PT/INR Coumadin education  Thanks for allowing pharmacy to be a part of this patient's care.  Excell Seltzer, PharmD Clinical Pharmacist, [phone]  09/15/2014 1:11 PM

## 2014-09-15 NOTE — Progress Notes (Signed)
A-fib/RVR on monitor with HR 150s after ambulating to the bathroom.  HR unchanged after getting back in bed.  Other VSS.  C/O chest discomfort and states she can feel her heart beating fast.  Also c/o mild nausea.  EKG shows A-fib with RVR.  Placed on O2 at 2L/min via [place].  Dr. Tommi Rumps notified.  Will continue to monitor.  Jodell Cipro

## 2014-09-15 NOTE — Progress Notes (Signed)
[place] for Heparin Indication: Afib  Allergies  Allergen Reactions  . Avelox [Moxifloxacin Hcl In Nacl] Shortness Of Breath and Swelling  . Codeine Swelling  . Garlic     Gi problems  . Latex     Watery blisters   . Onion     Gi problems  . Polysporin [Bacitracin-Polymyxin B]   . Sulfa Drugs Cross Reactors Swelling    Patient Measurements: Height: 5\' 1"  (154.9 cm) Weight: 118 lb 4.8 oz (53.661 kg) IBW/kg (Calculated) : 47.8  Vital Signs: Temp: 97.6 F (36.4 C) (01/27 1948) Temp Source: Oral (01/27 1948) BP: 123/37 mmHg (01/27 1948) Pulse Rate: 62 (01/27 1948)  Labs:  Recent Labs  09/13/14 1036  09/13/14 1830 09/13/14 2230 09/14/14 0030 09/14/14 0501 09/15/14 0435 09/15/14 1214 09/15/14 2116  HGB 12.2  --   --   --   --  12.3 10.4*  --   --   HCT 35.6*  --   --   --   --  36.0 30.5*  --   --   PLT 224  --   --   --   --  233 206  --   --   LABPROT  --   --  15.3*  --   --   --   --   --   --   INR  --   --  1.20  --   --   --   --   --   --   HEPARINUNFRC  --   --   --   --  0.36  --   --  0.28* 0.56  CREATININE 0.94  --   --   --   --  0.75  --   --   --   TROPONINI 0.23*  < > 1.15* 1.07*  --  0.86*  --   --   --   < > = values in this interval not displayed.  Estimated Creatinine Clearance: 35.3 mL/min (by C-G formula based on Cr of 0.75).  Assessment: 81 y.o. female with Afib on heparin and coumadin .  Heparin is at goal after increase to 850 units/hr.   Goal of Therapy:  INR 2-3 Heparin level 0.3-0.7 units/ml Monitor platelets by anticoagulation protocol: Yes   Plan:  -No heparin changes needed -Daily heparin level and CBC  Hildred Laser, Pharm D 09/15/2014 10:36 PM

## 2014-09-15 NOTE — Progress Notes (Signed)
[name] for Heparin Indication: Afib  Allergies  Allergen Reactions  . Avelox [Moxifloxacin Hcl In Nacl] Shortness Of Breath and Swelling  . Codeine Swelling  . Garlic     Gi problems  . Latex     Watery blisters   . Onion     Gi problems  . Polysporin [Bacitracin-Polymyxin B]   . Sulfa Drugs Cross Reactors Swelling    Patient Measurements: Height: 5\' 1"  (154.9 cm) Weight: 123 lb 4.8 oz (55.929 kg) IBW/kg (Calculated) : 47.8  Vital Signs: Temp: 97.9 F (36.6 C) (01/27 0404) Temp Source: Oral (01/27 0404) BP: 117/62 mmHg (01/27 0404) Pulse Rate: 79 (01/27 0404)  Labs:  Recent Labs  09/13/14 1036  09/13/14 1830 09/13/14 2230 09/14/14 0030 09/14/14 0501  HGB 12.2  --   --   --   --  12.3  HCT 35.6*  --   --   --   --  36.0  PLT 224  --   --   --   --  233  LABPROT  --   --  15.3*  --   --   --   INR  --   --  1.20  --   --   --   HEPARINUNFRC  --   --   --   --  0.36  --   CREATININE 0.94  --   --   --   --  0.75  TROPONINI 0.23*  < > 1.15* 1.07*  --  0.86*  < > = values in this interval not displayed.  Estimated Creatinine Clearance: 35.3 mL/min (by C-G formula based on Cr of 0.75).  Assessment: 81 y.o. female with Afib, s/p cath 1/26 for heparin  Goal of Therapy:  Heparin level 0.3-0.7 units/ml Monitor platelets by anticoagulation protocol: Yes   Plan:  Start heparin 800 units/hr Check heparin level in 8 hours.   Phillis Knack, PharmD, BCPS  09/15/2014 4:31 AM

## 2014-09-15 NOTE — Progress Notes (Signed)
CARDIAC REHAB PHASE I   PRE:  Rate/Rhythm: 75 SR    BP: sitting 113/64    SaO2:   MODE:  Ambulation: 310 ft   POST:  Rate/Rhythm: 90 SR    BP: sitting 131/81     SaO2: 100 RA  Pt up moving around room upon arrival, putting on makeup. Feeling well. Pt able to walk pushing IV pole. HR remained NSR but pt did need to stop and rest x2 due to DOE. Sts this is a fairly new problem for her, has been happening for about a week with walking. To recliner after walk, sts she was tired. VSS. Pt happy she walked. She can walk with nursing later. Will bring Afib book tomorrow for pt. [phone]   Darrick Meigs CES, ACSM 09/15/2014 11:33 AM

## 2014-09-16 ENCOUNTER — Encounter (HOSPITAL_COMMUNITY): Payer: Self-pay | Admitting: Cardiology

## 2014-09-16 ENCOUNTER — Other Ambulatory Visit: Payer: Self-pay | Admitting: Cardiology

## 2014-09-16 DIAGNOSIS — D509 Iron deficiency anemia, unspecified: Secondary | ICD-10-CM

## 2014-09-16 DIAGNOSIS — D500 Iron deficiency anemia secondary to blood loss (chronic): Secondary | ICD-10-CM

## 2014-09-16 DIAGNOSIS — I480 Paroxysmal atrial fibrillation: Secondary | ICD-10-CM

## 2014-09-16 DIAGNOSIS — Z7901 Long term (current) use of anticoagulants: Secondary | ICD-10-CM

## 2014-09-16 DIAGNOSIS — Z9889 Other specified postprocedural states: Secondary | ICD-10-CM

## 2014-09-16 HISTORY — DX: Long term (current) use of anticoagulants: Z79.01

## 2014-09-16 HISTORY — DX: Iron deficiency anemia, unspecified: D50.9

## 2014-09-16 LAB — CBC
HCT: 26.7 % — ABNORMAL LOW (ref 36.0–46.0)
Hemoglobin: 9.2 g/dL — ABNORMAL LOW (ref 12.0–15.0)
MCH: 30.3 pg (ref 26.0–34.0)
MCHC: 34.5 g/dL (ref 30.0–36.0)
MCV: 87.8 fL (ref 78.0–100.0)
Platelets: 198 10*3/uL (ref 150–400)
RBC: 3.04 MIL/uL — ABNORMAL LOW (ref 3.87–5.11)
RDW: 13.4 % (ref 11.5–15.5)
WBC: 5.2 10*3/uL (ref 4.0–10.5)

## 2014-09-16 LAB — OCCULT BLOOD X 1 CARD TO LAB, STOOL: Fecal Occult Bld: POSITIVE — AB

## 2014-09-16 LAB — PROTIME-INR
INR: 1.16 (ref 0.00–1.49)
Prothrombin Time: 15 seconds (ref 11.6–15.2)

## 2014-09-16 LAB — HEPARIN LEVEL (UNFRACTIONATED): Heparin Unfractionated: 0.68 IU/mL (ref 0.30–0.70)

## 2014-09-16 MED ORDER — CARVEDILOL 6.25 MG PO TABS: 6.2500 mg | ORAL_TABLET | Freq: Two times a day (BID) | ORAL | Status: DC

## 2014-09-16 MED ORDER — WARFARIN SODIUM 5 MG PO TABS: 5.0000 mg | ORAL_TABLET | Freq: Once | ORAL | Status: DC

## 2014-09-16 MED ORDER — WARFARIN SODIUM 5 MG PO TABS
5.0000 mg | ORAL_TABLET | Freq: Once | ORAL | Status: DC
  Filled 2014-09-16: qty 1

## 2014-09-16 MED ORDER — DILTIAZEM HCL ER 90 MG PO CP12: 90.0000 mg | ORAL_CAPSULE | Freq: Two times a day (BID) | ORAL | Status: DC

## 2014-09-16 MED ORDER — ROSUVASTATIN CALCIUM 20 MG PO TABS: 20.0000 mg | ORAL_TABLET | Freq: Every day | ORAL | Status: DC

## 2014-09-16 MED ORDER — IRON 28 MG PO TABS: 1.0000 | ORAL_TABLET | Freq: Two times a day (BID) | ORAL | Status: DC

## 2014-09-16 NOTE — Progress Notes (Signed)
Subjective: No complaints, feels well, walked in hall twice without complications, lives at [facility] in [place] living.  Objective: Vital signs in last 24 hours: Temp:  [97.6 F (36.4 C)-98.2 F (36.8 C)] 97.9 F (36.6 C) (01/28 0459) Pulse Rate:  [60-75] 60 (01/28 0459) Resp:  [18] 18 (01/28 0459) BP: (112-142)/(37-77) 119/44 mmHg (01/28 0459) SpO2:  [96 %-99 %] 96 % (01/28 0459) Weight:  [125 lb 9.6 oz (56.972 kg)] 125 lb 9.6 oz (56.972 kg) (01/28 0459) Weight change: 7 lb 4.8 oz (3.311 kg) Last BM Date: 09/14/14 Intake/Output from previous day:  -557 (-821 since admit) 01/27 0701 - 01/28 0700 In: 243 [P.O.:240; I.V.:3] Out: 800 [Urine:800] Intake/Output this shift:    PE: General:Pleasant affect, NAD Skin:Warm and dry, brisk capillary refill HEENT:normocephalic, sclera clear, mucus membranes moist Heart:S1S2 RRR with 2/6 systolic murmur, no gallup, rub or click Lungs:clear without rales, rhonchi, or wheezes XAJ:OINO, non tender, + BS, do not palpate liver spleen or masses Ext:no lower ext edema, 2+ pedal pulses, 2+ radial pulses Neuro:alert and oriented X 3, MAE, follows commands, + facial symmetry  tele:  SR rate of 60-70- one 5 beat run of SVT Lab Results:  Recent Labs  09/15/14 0435 09/16/14 0520  WBC 6.1 5.2  HGB 10.4* 9.2*  HCT 30.5* 26.7*  PLT 206 198   BMET  Recent Labs  09/13/14 1036 09/14/14 0501  NA 141 142  K 3.6 3.5  CL 106 105  CO2 28 28  GLUCOSE 102* 101*  BUN 24* 23  CREATININE 0.94 0.75  CALCIUM 10.0 10.2    Recent Labs  09/13/14 2230 09/14/14 0501  TROPONINI 1.07* 0.86*    Lab Results  Component Value Date   CHOL 136 09/14/2014   HDL 50 09/14/2014   LDLCALC 62 09/14/2014   TRIG 119 09/14/2014   CHOLHDL 2.7 09/14/2014   Lab Results  Component Value Date   HGBA1C 5.6 09/13/2014     Lab Results  Component Value Date   TSH 0.154* 03/05/2014    Hepatic Function Panel  Recent Labs   09/14/14 0501  PROT 7.0  ALBUMIN 4.0  AST 33  ALT 16  ALKPHOS 28*  BILITOT 0.7    Recent Labs  09/14/14 0501  CHOL 136   No results for input(s): PROTIME in the last 72 hours.     Studies/Results: No results found.  Medications: I have reviewed the patient's current medications. Scheduled Meds: . acidophilus  1 capsule Oral Daily  . aspirin EC  81 mg Oral Daily  . benazepril  40 mg Oral Daily  . calcium-vitamin D  1 tablet Oral Q breakfast  . carvedilol  6.25 mg Oral BID WC  . cholecalciferol  1,000 Units Oral Daily  . cycloSPORINE  1 drop Both Eyes Daily  . diltiazem  90 mg Oral Q12H  . fenofibrate  160 mg Oral Daily  . ferrous sulfate  325 mg Oral Q breakfast  . fluticasone  1 spray Each Nare Daily  . gabapentin  100 mg Oral TID AC  . gabapentin  300 mg Oral QHS  . hydrochlorothiazide  25 mg Oral Daily  . HYDROcodone-acetaminophen  1 tablet Oral TID AC & HS  . loratadine  10 mg Oral Daily  . multivitamin with minerals  1 tablet Oral Daily  . pantoprazole  40 mg Oral Daily  . pyridOXINE  100 mg Oral Daily  . rosuvastatin  20 mg Oral  [id number]  . sodium chloride  3 mL Intravenous Q12H  . vitamin B-12  1,000 mcg Oral Daily  . Warfarin - Pharmacist Dosing Inpatient   Does not apply q1800   Continuous Infusions: . sodium chloride 75 mL/hr at 09/14/14 1800   PRN Meds:.sodium chloride, acetaminophen, acetaminophen, ALPRAZolam, colestipol, hydroxypropyl methylcellulose / hypromellose, nitroGLYCERIN, ondansetron (ZOFRAN) IV, ondansetron (ZOFRAN) IV, simethicone, sodium chloride, zolpidem  Assessment/Plan:  81 y.o. female with no history of CAD. She has a history of HTN, HLD, MVP.  Admitted 09/13/14 after awaking @ ~0430 with midsternal dull aching in her chest - lasted ~5 min. Recurred @ ~0830 --> ER. Ruled in for mild NSTEMI with Troponin >1. Had noted increasing DOE. No sensation of palpitations.  Cardiac Cath 1/26 - no notable CAD. Mild Pulm HTN - reduced CO/CI  despite hyperdynamic EF. 3+ MR.  Yesterday new onset AF occurred overnight while ambulating from the bathroom. She developed SOB and chest discomfort consistent with her presenting symptoms. ECG AF with RVR, rates 150s, diffuse ST depressions. Started on Dilt gtt.  Spontaneous conversion to NSR ~0630 yesterday.  Principal Problem:  Non-ST elevation myocardial infarction (NSTEMI), initial care episode Active Problems:  Mild aortic valve stenosis  Moderate to severe pulmonary hypertension  Essential hypertension  Hyperlipidemia  Dyspnea on exertion  Severe tricuspid regurgitation  Moderate mitral regurgitation   NSTEMI - Angiographically normal Coronaries - no culprit lesion. 3+ MR but only Mild Pulm HTN noted  With new onset of Afib this AM - ? If her + Troponin could have been related to PAF medicated Demand Ischemia in setting of significant valvular disease.  IV Heparin was restarted to complete 48 hr course post cath (just in case of small vessel infarct) -- will continue for now until we determine appropriate anticoagulation options for Afib.- see below  New Onset Afib - converted with IV Diltiazem mediated rate control --> will d/c Amlodipine & convert to BID Diltiazem (90 mg BID; CHA2DS2VASC Score = 4 (Age, Sex, HTN); HAS Bled Score 3 (on ASA)  She does have significant valvular disease -- although not technically Rheumatic valve disease, would probably be most appropriate to use Warfarin for anticoagulation (will defer to Dr. Claiborne Billings to consider NOAC as OP) -- do not need to bridge (but will take advantage of IV Heparin already being used)  For now, with borderline pressures, I am reluctant to increase Coreg dose - prefer 2nd agent   HTN: BP was borderline- but has improved; d/c Amlodipine in lieu of Diltiazem (avoid 2 CCBs), may need to consider backing off ACE-I -= or stopping HCTZ.  On statin   PPI for GI prophylaxis   Valvular Heart Disease / Pulm HTN - by  RHC, PA pressures & PCWP not overly elevated. CO/CI are reduced - does not correlate with EF (probably off due to Valvular disease). She does not appear to be volume overloaded. Given her age & relative lack of Sx prior to this admission, Medical Rx has been appropriate. Would defer to primary cardiologist to determine if surgical intervention is to be considered.  Start Warfarin tonite -- will be on Heparin to complete 48 hrs.  Monitor HR / Rhythm on Tele -- during the night HR to 49 otherwise SR.  -anemia has progressed over last 3 days now H/H  9.2/26.7 from 12.3/36 on the 26th.  Hemoccults ordered but not yet done.  With addition of anticoagulation- consider outpatient GI consult.   LOS: 3 days   Time spent with  pt. :15 minutes. [facility] R  Nurse Practitioner Certified Pager [phone] or after 5pm and on weekends call [phone] 09/16/2014, 7:43 AM   I have seen, examined and evaluated the patient this AM along with Cecilie Kicks, NP.  After reviewing all the available data and chart,  I agree with her findings, examination as well as impression recommendations.  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 On the night following her catheterization she had acute onset of A. fib with RVR which replicated her pains. She was initially continued on heparin while started on warfarin. Her beta blocker was increased and she spontaneously converted. She has otherwise remained clinically stable with only mild bradycardia while sleeping.  For now I think were safe with low-dose beta blocker plus low-dose calcium channel blocker combination. She is noted to be somewhat bradycardic in the outpatient setting would probably reduce her Cardizem to 60 mg twice a day.  Due to a drop in hemoglobin/anemia from admission, her heparin was stopped prior to completing the 48 hours. We will stop aspirin for  discharge. Hemoccult was mildly positive but she has not had any melena.  He has walked up and down the hallway without any problems or complaints. She's not had any concerning abdominal symptoms to suggest GI bleed. At this point I think she is stable for discharge. She is going to a monitored environment [facility] where she can have blood work drawn for INR and hemoglobin. She will have labs checked tomorrow and Monday. She will then follow-up with our clinic for management of her INRs.  If hemoglobin continues to trend down despite being off of IV heparin and aspirin, then would consider outpatient GI evaluation. I don't think that her warfarin dosing will cause her INR to rise acutely, this would then allow for any potential mild oozing to heal.   Otherwise think she is stable for discharge.  Leonie Man, M.D., M.S. Interventional Cardiologist   Pager # [phone]

## 2014-09-16 NOTE — Progress Notes (Signed)
Pt is ready for DC to [facility] accompanied by Son. Pt and Son report they understand all DC instructions, follow up appointments, and medications.   Prescilla Sours, Therapist, sports

## 2014-09-16 NOTE — Progress Notes (Signed)
[place] for coumadin Indication: Afib  Allergies  Allergen Reactions  . Avelox [Moxifloxacin Hcl In Nacl] Shortness Of Breath and Swelling  . Codeine Swelling  . Garlic     Gi problems  . Latex     Watery blisters   . Onion     Gi problems  . Polysporin [Bacitracin-Polymyxin B]   . Sulfa Drugs Cross Reactors Swelling    Patient Measurements: Height: 5\' 1"  (154.9 cm) Weight: 125 lb 9.6 oz (56.972 kg) IBW/kg (Calculated) : 47.8  Vital Signs: Temp: 97.9 F (36.6 C) (01/28 0459) Temp Source: Oral (01/28 0459) BP: 110/63 mmHg (01/28 0819) Pulse Rate: 67 (01/28 0819)  Labs:  Recent Labs  09/13/14 1036  09/13/14 1830 09/13/14 2230  09/14/14 0501 09/15/14 0435 09/15/14 1214 09/15/14 2116 09/16/14 0520  HGB 12.2  --   --   --   --  12.3 10.4*  --   --  9.2*  HCT 35.6*  --   --   --   --  36.0 30.5*  --   --  26.7*  PLT 224  --   --   --   --  233 206  --   --  198  LABPROT  --   --  15.3*  --   --   --   --   --   --  15.0  INR  --   --  1.20  --   --   --   --   --   --  1.16  HEPARINUNFRC  --   --   --   --   < >  --   --  0.28* 0.56 0.68  CREATININE 0.94  --   --   --   --  0.75  --   --   --   --   TROPONINI 0.23*  < > 1.15* 1.07*  --  0.86*  --   --   --   --   < > = values in this interval not displayed.  Estimated Creatinine Clearance: 35.3 mL/min (by C-G formula based on Cr of 0.75).  Assessment: 81 y.o. female with Afib started on coumadin.  Heparin has been dced this am.  Her INR today is 1.16, Hg has dropped.  She has no obvious bleeding with plan to check stools.   Goal of Therapy:  INR 2-3 Monitor platelets by anticoagulation protocol: Yes   Plan:  Coumadin 5 mg po today Daily PT/INR Coumadin education Monitor for bleeding complications  Thanks for allowing pharmacy to be a part of this patient's care.  Excell Seltzer, PharmD Clinical Pharmacist, [phone]  09/16/2014 8:24 AM

## 2014-09-16 NOTE — Discharge Instructions (Addendum)
Call the office if any bleeding or rapid HR causing chesty pain. You will lab work on Friday and again on Monday.  On Monday you will come to office to have coumadin checked.    Heart Healthy diet. YOU NEED BLOOD WORK A CBC TOMORROW AND AGAIN ON Monday THE Monday LAB CAN BE DONE IN OUR OFICE BUT FOR TOMORROW-Friday IF FRIENDS HOME CAN DRAW AND SEND TO DR. Evette Georges OFFICE THAT IS GOOD IF NOT COME TO THE FIRST FLOOR ON [place] OFFICE TO LAB AND HAVE LABS DONE.    Warfarin: What You Need to Know Warfarin is an anticoagulant. Anticoagulants help prevent the formation of blood clots. They also help stop the growth of blood clots. Warfarin is sometimes referred to as a "blood thinner."  Normally, when body tissues are cut or damaged, the blood clots in order to prevent blood loss. Sometimes clots form inside your blood vessels and obstruct the flow of blood through your circulatory system (thrombosis). These clots may travel through your bloodstream and become lodged in smaller blood vessels in your brain, which can cause a stroke, or in your lungs (pulmonary embolism). WHO SHOULD USE WARFARIN? Warfarin is prescribed for people at risk of developing harmful blood clots:  People with surgically implanted mechanical heart valves, irregular heart rhythms called atrial fibrillation, and certain clotting disorders.  People who have developed harmful blood clotting in the past, including those who have had a stroke or a pulmonary embolism, or thrombosis in their legs (deep vein thrombosis [DVT]).  People with an existing blood clot, such as a pulmonary embolism. WARFARIN DOSING Warfarin tablets come in different strengths. Each tablet strength is a different color, with the amount of warfarin (in milligrams) clearly printed on the tablet. If the color of your tablet is different than usual when you receive a new prescription, report it immediately to your pharmacist or health care provider. WARFARIN  MONITORING The goal of warfarin therapy is to lessen the clotting tendency of blood but not prevent clotting completely. Your health care provider will monitor the anticoagulation effect of warfarin closely and adjust your dose as needed. For your safety, blood tests called prothrombin time (PT) or international normalized ratio (INR) are used to measure the effects of warfarin. Both of these tests can be done with a finger stick or a blood draw. The longer it takes the blood to clot, the higher the PT or INR. Your health care provider will inform you of your "target" PT or INR range. If, at any time, your PT or INR is above the target range, there is a risk of bleeding. If your PT or INR is below the target range, there is a risk of clotting. Whether you are started on warfarin while you are in the hospital or in your health care provider's office, you will need to have your PT or INR checked within one week of starting the medicine. Initially, some people are asked to have their PT or INR checked as much as twice a week. Once you are on a stable maintenance dose, the PT or INR is checked less often, usually once every 2 to 4 weeks. The warfarin dose may be adjusted if the PT or INR is not within the target range. It is important to keep all laboratory and health care provider follow-up appointments. Not keeping appointments could result in a chronic or permanent injury, pain, or disability because warfarin is a medicine that requires close monitoring. WHAT ARE THE  SIDE EFFECTS OF WARFARIN?  Too much warfarin can cause bleeding (hemorrhage) from any part of the body. This may include bleeding from the gums, blood in the urine, bloody or dark stools, a nosebleed that is not easily stopped, coughing up blood, or vomiting blood.  Too little warfarin can increase the risk of blood clots.  Too little or too much warfarin can also increase the risk of a stroke.  Warfarin use may cause a skin rash or  irritation, an unusual fever, continual nausea or stomach upset, or severe pain in your joints or back. SPECIAL PRECAUTIONS WHILE TAKING WARFARIN Warfarin should be taken exactly as directed. It is very important to take warfarin as directed since bleeding or blood clots could result in chronic or permanent injury, pain, or disability.  Take your medicine at the same time every day. If you forget to take your dose, you can take it if it is within 6 hours of when it was due.  Do not change the dose of warfarin on your own to make up for missed or extra doses.  If you miss more than 2 doses in a row, you should contact your health care provider for advice. Avoid situations that cause bleeding. You may have a tendency to bleed more easily than usual while taking warfarin. The following actions can limit bleeding:  Using a softer toothbrush.  Flossing with waxed floss rather than unwaxed floss.  Shaving with an Copy rather than a blade.  Limiting the use of sharp objects.  Avoiding potentially harmful activities, such as contact sports. Warfarin and Pregnancy or Breastfeeding  Warfarin is not advised during the first trimester of pregnancy due to an increased risk of birth defects. In certain situations, a woman may take warfarin after her first trimester of pregnancy. A woman who becomes pregnant or plans to become pregnant while taking warfarin should notify her health care provider immediately.  Although warfarin does not pass into breast milk, a woman who wishes to breastfeed while taking warfarin should also consult with her health care provider. Alcohol, Smoking, and Illicit Drug Use  Alcohol affects how warfarin works in the body. It is best to avoid alcoholic drinks or consume very small amounts while taking warfarin. In general, alcohol intake should be limited to 1 oz (30 mL) of liquor, 6 oz (180 mL) of wine, or 12 oz (360 mL) of beer each day. Notify your health care  provider if you change your alcohol intake.  Smoking affects how warfarin works. It is best to avoid smoking while taking warfarin. Notify your health care provider if you change your smoking habits.  It is best to avoid all illicit drugs while taking warfarin since there are few studies that show how warfarin interacts with these drugs. Other Medicines and Dietary Supplements Many prescription and over-the-counter medicines can interfere with warfarin. Be sure all of your health care providers know you are taking warfarin. Notify your health care provider who prescribed warfarin for you or your pharmacist before starting or stopping any new medicines, including over-the-counter vitamins, dietary supplements, and pain medicines. Your warfarin dose may need to be adjusted. Some common over-the-counter medicines that may increase the risk of bleeding while taking warfarin include:   Acetaminophen.  Aspirin.  Nonsteroidal anti-inflammatory medicines (NSAIDs), such as ibuprofen or naproxen.  Vitamin E. Dietary Considerations  Foods that have moderate or high amounts of vitamin K can interfere with warfarin. Avoid major changes in your diet or notify your health  care provider before changing your diet. Eat a consistent amount of foods that have moderate or high amounts of vitamin K. Eating less foods containing vitamin K can increase the risk of bleeding. Eating more foods containing vitamin K can increase the risk of blood clots. Additional questions about dietary considerations can be discussed with a dietitian. Foods that are very high in vitamin K:  Greens, such as Swiss chard and beet, collard, mustard, or turnip greens (fresh or frozen, cooked).  Kale (fresh or frozen, cooked).  Parsley (raw).  Spinach (cooked). Foods that are high in vitamin K:  Asparagus (frozen, cooked).  Beans, green (frozen, cooked).  Broccoli.  Bok choy (cooked).  Brussels sprouts (fresh or frozen,  cooked).  Cabbage (cooked).   Coleslaw. Foods that are moderately high in vitamin K:  Blueberries.  Black-eyed peas.  Endive (raw).  Green leaf lettuce (raw).  Green scallions (raw).  Kale (raw).  Okra (frozen, cooked).  Plantains (fried).  Romaine lettuce (raw).  Sauerkraut (canned).  Spinach (raw). CALL YOUR CLINIC OR HEALTH CARE PROVIDER IF YOU:  Plan to have any surgery or procedure.  Feel sick, especially if you have diarrhea or vomiting.  Experience or anticipate any major changes in your diet.  Start or stop a prescription or over-the-counter medicine.  Become, plan to become, or think you may be pregnant.  Are having heavier than usual menstrual periods.  Have had a fall, accident, or any symptoms of bleeding or unusual bruising.  Develop an unusual fever. CALL 911 IN THE [place] OR GO TO THE EMERGENCY DEPARTMENT IF YOU:   Think you may be having an allergic reaction to warfarin. The signs of an allergic reaction could include itching, rash, hives, swelling, chest tightness, or trouble breathing.  See signs of blood in your urine. The signs could include reddish, pinkish, or tea-colored urine.  See signs of blood in your stools. The signs could include bright red or black stools.  Vomit or cough up blood. In these instances, the blood could have either a bright red or a "coffee-grounds" appearance.  Have bleeding that will not stop after applying pressure for 30 minutes such as cuts, nosebleeds, or other injuries.  Have severe pain in your joints or back.  Have a new and severe headache.  Have sudden weakness or numbness of your face, arm, or leg, especially on one side of your body.  Have sudden confusion or trouble understanding. Chest Pain (Nonspecific) It is often hard to give a specific diagnosis for the cause of chest pain. There is always a chance that your pain could be related to something serious, such as a heart attack or a blood clot  in the lungs. You need to follow up with your health care provider for further evaluation. CAUSES  Heartburn. Pneumonia or bronchitis. Anxiety or stress. Inflammation around your heart (pericarditis) or lung (pleuritis or pleurisy). A blood clot in the lung. A collapsed lung (pneumothorax). It can develop suddenly on its own (spontaneous pneumothorax) or from trauma to the chest. Shingles infection (herpes zoster virus). The chest wall is composed of bones, muscles, and cartilage. Any of these can be the source of the pain. The bones can be bruised by injury. The muscles or cartilage can be strained by coughing or overwork. The cartilage can be affected by inflammation and become sore (costochondritis). DIAGNOSIS  Lab tests or other studies may be needed to find the cause of your pain. Your health care provider may have you take a  test called an ambulatory electrocardiogram (ECG). An ECG records your heartbeat patterns over a 24-hour period. You may also have other tests, such as: Transthoracic echocardiogram (TTE). During echocardiography, sound waves are used to evaluate how blood flows through your heart. Transesophageal echocardiogram (TEE). Cardiac monitoring. This allows your health care provider to monitor your heart rate and rhythm in real time. Holter monitor. This is a portable device that records your heartbeat and can help diagnose heart arrhythmias. It allows your health care provider to track your heart activity for several days, if needed. Stress tests by exercise or by giving medicine that makes the heart beat faster. TREATMENT  Treatment depends on what may be causing your chest pain. Treatment may include: Acid blockers for heartburn. Anti-inflammatory medicine. Pain medicine for inflammatory conditions. Antibiotics if an infection is present. You may be advised to change lifestyle habits. This includes stopping smoking and avoiding alcohol, caffeine, and chocolate. You  may be advised to keep your head raised (elevated) when sleeping. This reduces the chance of acid going backward from your stomach into your esophagus. Most of the time, nonspecific chest pain will improve within 2-3 days with rest and mild pain medicine.  HOME CARE INSTRUCTIONS  If antibiotics were prescribed, take them as directed. Finish them even if you start to feel better. For the next few days, avoid physical activities that bring on chest pain. Continue physical activities as directed. Do not use any tobacco products, including cigarettes, chewing tobacco, or electronic cigarettes. Avoid drinking alcohol. Only take medicine as directed by your health care provider. Follow your health care provider's suggestions for further testing if your chest pain does not go away. Keep any follow-up appointments you made. If you do not go to an appointment, you could develop lasting (chronic) problems with pain. If there is any problem keeping an appointment, call to reschedule. SEEK MEDICAL CARE IF:  Your chest pain does not go away, even after treatment. You have a rash with blisters on your chest. You have a fever. SEEK IMMEDIATE MEDICAL CARE IF:  You have increased chest pain or pain that spreads to your arm, neck, jaw, back, or abdomen. You have shortness of breath. You have an increasing cough, or you cough up blood. You have severe back or abdominal pain. You feel nauseous or vomit. You have severe weakness. You faint. You have chills. This is an emergency. Do not wait to see if the pain will go away. Get medical help at once. Call your local emergency services (911 in [place] Do not drive yourself to the hospital. MAKE SURE YOU:  Understand these instructions. Will watch your condition. Will get help right away if you are not doing well or get worse. Document Released: 05/16/2005 Document Revised: 08/11/2013 Document Reviewed: 03/11/2008 [facility] Patient Information 2015 [place],  [place]. This information is not intended to replace advice given to you by your health care provider. Make sure you discuss any questions you have with your health care provider.   Have sudden trouble seeing in one or both eyes.  Have sudden trouble walking, dizziness, loss of balance, or coordination.  Have trouble speaking or understanding (aphasia). Document Released: 08/06/2005 Document Revised: 12/21/2013 Document Reviewed: 01/30/2013 [facility] Patient Information 2015 [place], [place]. This information is not intended to replace advice given to you by your health care provider. Make sure you discuss any questions you have with your health care provider.

## 2014-09-16 NOTE — Progress Notes (Addendum)
CARDIAC REHAB PHASE I   PRE:  Rate/Rhythm: 61 SR    BP: sitting 131/56    SaO2: 96 RA  MODE:  Ambulation: 460 ft   POST:  Rate/Rhythm: 80 SR    BP: sitting 117/84     SaO2: 99 RA  Pt sts she feels weak and slightly "off" but also is prob attributed to her large socks. Steady for me, independent. Less SOB today, able to walk and talk. Increased distance. Maintained SR. Pt already has Afib booklet and plans to read it. Reminded pt to increase activity slowly at home. Not appropriate for CRPII 1025-1100   Darrick Meigs CES, ACSM 09/16/2014 10:56 AM

## 2014-09-16 NOTE — Discharge Summary (Signed)
Physician Discharge Summary       Patient ID: [name] MRN: [id number] DOB/AGE: Mar 21, 1922 81 y.o.  Admit date: 09/13/2014 Discharge date: 09/16/2014 Primary Cardiologist:Dr. Claiborne Billings   Discharge Diagnoses:  Principal Problem:   Non-ST elevation myocardial infarction (NSTEMI), initial care episode, secondary to PAF most likely Active Problems:   PAF (paroxysmal atrial fibrillation)   S/P cardiac cath 09/14/14 mild calcification of ostial RCA but otherwise normal coronary arteries   Chronic anticoagulation, with coumadin, with PAF and CHADS2Vasc2 score of 4   Mild aortic valve stenosis   Moderate to severe pulmonary hypertension   Essential hypertension   Hyperlipidemia   Dyspnea on exertion   Severe tricuspid regurgitation   Moderate mitral regurgitation   Anemia, iron deficiency   Discharged Condition: good  Procedures: 09/14/14 rt and Lt heart cath by Dr. Clearnce Hasten Course: 81 y.o. female with no history of CAD. She has a history of HTN, HLD, MVP.   She woke at 4:30 am 09/13/14 with midsternal dull aching in her chest. Felt it was indigestion, 2/10, burping helped. No associated symptoms, no radiation. She took no medications. Symptoms improved after burping, in about 5 minutes, she was able to sleep. She had onset of squeezing midsternal chest pain at 8:30 am, while making breakfast. She had SOB, but no N&V, diaphoresis. 5/10, no radiation. She took no medications. Symptoms lasted about 5 minutes. She still "didn't feel right", but no specific complaints except maybe a little lightheaded. She called the RN, who took a BP and said her SBP was 80s. EMS was called, by their arrival, she was asymptomatic and remained so. She was given ASA 81 mg x 4. She was no longer light-headed. She has never had chest pain before. She has noticed increasing DOE, with walking extended distances, but no chest pain.   She was admitted with NSTEMI with new inf T wave changes.  Troponin on  admit 0.23.  Troponin pk of 1.15.  She had cath on the 26th with essentially normal coronary arteries.  EF of 70% with near mid cavity obliteration and 3+ MR.  Her heparin IV was continued for 48 hours post cath.    In the early AM hours of the 27th pt had PAF with RVR and her symptoms were same as admit.  Dr. Ellyn Hack felt this was cause of her NSTEMI.  She spontaneously converted after being placed on IV dilt drip.  She was changed to po diltiazem and her amlodipine was stopped.  She maintained SR to some SB at night.  One 4 beat run SVT.  Her CHADS2VASc2 score of 4 and bleeding score of  3- she was placed on coumadin with no need for crossover.    Her H/H did decrease on ASA and Heparin so ASA stopped and heparin.  Hemoccult was positive but no gross melena.  She has hx of iron def anemia so her iron was increased to BID.  She was seen and found stable by Dr. Ellyn Hack for discharge.  She will need CBC on the 29th and then a CBC and INR on Monday.  Pt aware.  She will follow up with APP then later with Dr. Corky Downs.  We increased her crestor as well. She is aware to call if any bleeding or further pain.  She will take 5 mg coumadin daily.  Pt had ambulated in the hall without problems.   Consults: cardiology  Significant Diagnostic Studies:  BMET    Component  Value Date/Time   NA 142 09/14/2014 0501   K 3.5 09/14/2014 0501   CL 105 09/14/2014 0501   CO2 28 09/14/2014 0501   GLUCOSE 101* 09/14/2014 0501   BUN 23 09/14/2014 0501   CREATININE 0.75 09/14/2014 0501   CALCIUM 10.2 09/14/2014 0501   GFRNONAA 72* 09/14/2014 0501   GFRAA 84* 09/14/2014 0501    CBC    Component Value Date/Time   WBC 5.2 09/16/2014 0520   RBC 3.04* 09/16/2014 0520   HGB 9.2* 09/16/2014 0520   HCT 26.7* 09/16/2014 0520   PLT 198 09/16/2014 0520   MCV 87.8 09/16/2014 0520   MCH 30.3 09/16/2014 0520   MCHC 34.5 09/16/2014 0520   RDW 13.4 09/16/2014 0520   LYMPHSABS 1.2 09/13/2014 1036   MONOABS 0.4  09/13/2014 1036   EOSABS 0.3 09/13/2014 1036   BASOSABS 0.0 09/13/2014 1036    Lipid Panel     Component Value Date/Time   CHOL 136 09/14/2014 0501   TRIG 119 09/14/2014 0501   HDL 50 09/14/2014 0501   CHOLHDL 2.7 09/14/2014 0501   VLDL 24 09/14/2014 0501   LDLCALC 62 09/14/2014 0501   Hepatic Function Latest Ref Rng 09/14/2014 03/24/2008  Total Protein 6.0 - 8.3 g/dL 7.0 7.3  Albumin 3.5 - 5.2 g/dL 4.0 4.2  AST 0 - 37 U/L 33 22  ALT 0 - 35 U/L 16 20  Alk Phosphatase 39 - 117 U/L 28(L) 41  Total Bilirubin 0.3 - 1.2 mg/dL 0.7 0.8   Troponin pk of 1.15  hgbA1c 5.6  INR at discharge 1.16    PORTABLE CHEST - 1 VIEW COMPARISON: 12/21/2013 FINDINGS: Cardiac shadow is stable. Diffuse aortic calcifications are seen. The lungs are well aerated without focal infiltrate or sizable effusion. No bony abnormality is noted. IMPRESSION: No active disease.    Cardiac Cath 09/14/14:  HEMODYNAMICS:   RA: a 6 mean 3 RV: 30/7 PA: 30/9 PC: a 10 V 14 mean 10  AO: 161/57 PA: 33 12  LV: 152/11 PC: 10  LV: 151/11 AO: 151/50  Oxygen saturation in the aorta 98% and the pulmonary artery 64%  Cardiac output: 3.3 l/min (Thermo); 3.7 (Fick)  Cardiac index: 2.1 l/m/m2 2.3  ANGIOGRAPHY:  Fluoroscopy revealed severe mitral annular calcification and very mild calcification in the region of the aortic valve  Left main: Normal vessel which trifurcated into an LAD, and intermediate vessel, and a small left circumflex system.  LAD: Angiographically normal vessel which gave rise to a proximal bifurcating diagonal branch. There was a mild region of an intramyocardial LAD segment without systolic bridging. The LAD rise to several septal perforating arteries and extended to the apex.  Ramus intermediate: Angiographically normal small vessel.   Left circumflex: Angiographically normal small vessel which gave rise to one marginal branch.   Right coronary artery: There  was evidence for calcification in the region of the RCA ostium without stenosis. The RCA was a dominant vessel which was free of significant disease and gave rise to PDA and PLA branch.  Left ventriculography revealed hyperdynamic LV function with an ejection fraction of approximately 70% with near mid cavity obliteration. There was severe mitral annular calcification with 3+ mitral regurgitation.   IMPRESSION: Hyperdynamic LV function with an ejection fraction of approximately 70%. Severe mitral annular calcification with 3+ mitral regurgitation. Mild calcification in the region of the RCA ostium, but otherwise normal-appearing coronary arteries. Borderline/mild pulmonary hypertension. RECOMMENDATION:  Medical therapy   Discharge Exam: Blood pressure 110/63,  pulse 67, temperature 97.8 F (36.6 C), temperature source Oral, resp. rate 18, height _0  (1.549 m), weight 125 lb 9.6 oz (56.972 kg), SpO2 98 %.    Disposition: 01-Home or Self Care  Lives independently at [facility]     Medication List    STOP taking these medications        amLODipine 5 MG tablet  Commonly known as:  NORVASC     aspirin EC 81 MG tablet      TAKE these medications        Acidophilus 100 MG Caps  Take 100 mg by mouth daily.     benazepril 40 MG tablet  Commonly known as:  LOTENSIN  Take 1 tablet (40 mg total) by mouth daily.     CALTRATE 600+D 600-400 MG-UNIT per tablet  Generic drug:  Calcium Carbonate-Vitamin D  Take 1 tablet by mouth daily.     carvedilol 6.25 MG tablet  Commonly known as:  COREG  Take 1 tablet (6.25 mg total) by mouth 2 (two) times daily with a meal.     cetirizine 10 MG tablet  Commonly known as:  ZYRTEC  Take 10 mg by mouth daily.     cholecalciferol 1000 UNITS tablet  Commonly known as:  VITAMIN D  Take 1,000 Units by mouth daily.     colestipol 1 G tablet  Commonly known as:  COLESTID  Take 1 g by mouth 2 (two) times daily as needed. For diarrhea      cycloSPORINE 0.05 % ophthalmic emulsion  Commonly known as:  RESTASIS  Place 1 drop into both eyes daily.     diltiazem 90 MG 12 hr capsule  Commonly known as:  CARDIZEM SR  Take 1 capsule (90 mg total) by mouth every 12 (twelve) hours.     diphenhydrAMINE 25 mg capsule  Commonly known as:  BENADRYL  Take 25 mg by mouth at bedtime.     fenofibrate micronized 134 MG capsule  Commonly known as:  LOFIBRA  Take 1 capsule (134 mg total) by mouth daily.     Flaxseed Oil 1000 MG Caps  Take 3 capsules by mouth daily. 2 @ lunch and 1 @ dinner     flunisolide 25 MCG/ACT (0.025%) Soln  Commonly known as:  NASALIDE  Inhale 1 spray into the lungs daily.     gabapentin 300 MG capsule  Commonly known as:  NEURONTIN  Take 1 capsule three times daily and take 3 capsules at bedtime     hydrochlorothiazide 25 MG tablet  Commonly known as:  HYDRODIURIL  Take 0.5 tablets (12.5 mg total) by mouth 2 (two) times daily.     HYDROcodone-acetaminophen 5-325 MG per tablet  Commonly known as:  NORCO/VICODIN  Take 1 tablet by mouth every 6 (six) hours.     hydroxypropyl methylcellulose / hypromellose 2.5 % ophthalmic solution  Commonly known as:  ISOPTO TEARS / GONIOVISC  Place 1 drop into both eyes as needed for dry eyes.     Iron 28 MG Tabs  Take 1 tablet (28 mg total) by mouth 2 (two) times daily.     lansoprazole 15 MG capsule  Commonly known as:  PREVACID  Take 15 mg by mouth daily.     multivitamin tablet  Take 1 tablet by mouth daily.     pyridOXINE 100 MG tablet  Commonly known as:  VITAMIN B-6  Take 100 mg by mouth daily.     rosuvastatin 20 MG  tablet  Commonly known as:  CRESTOR  Take 1 tablet (20 mg total) by mouth daily at 6 PM.     simethicone 125 MG chewable tablet  Commonly known as:  MYLICON  Chew 270 mg by mouth as needed for flatulence.     vitamin B-12 1000 MCG tablet  Commonly known as:  CYANOCOBALAMIN  Take 1,000 mcg by mouth daily.     warfarin 5 MG tablet    Commonly known as:  COUMADIN  Take 1 tablet (5 mg total) by mouth one time only at 6 PM.       Follow-up Information    Follow up with [facility].   Why:  with Kristen for coumadin clinic and then CBC.    Contact information:   [address] [place] [address] [phone]      Follow up with Tarri Fuller, PA-C On 10/01/2014.   Specialty:  Physician Assistant   Why:  at 3:30 pm- at Dr. Evette Georges office with his PA   Contact information:   [address] [place] [address] [phone]       Follow up with Troy Sine, MD On 11/19/2014.   Specialty:  Cardiology   Why:  at 11:15 AM   Contact information:   [address] [place] [address] [phone]        Discharge Instructions: Call the office if any bleeding or rapid HR causing chesty pain. You will lab work on Friday and again on Monday.  On Monday you will come to office to have coumadin checked.    Heart Healthy diet. YOU NEED BLOOD WORK A CBC TOMORROW AND AGAIN ON Monday THE Monday LAB CAN BE DONE IN OUR OFICE BUT FOR TOMORROW-Friday IF FRIENDS HOME CAN DRAW AND SEND TO DR. Evette Georges OFFICE THAT IS GOOD IF NOT COME TO THE FIRST FLOOR ON [facility] OFFICE TO LAB AND HAVE LABS DONE.   Signed: Isaiah Serge Nurse Practitioner-Certified [facility]: [facility] 09/16/2014, 12:22 PM  Time spent on discharge :>35 minutes.    ATTENDING ATTESTATION:  I have seen, examined and evaluated the patient this AM along with Cecilie Kicks, NP.  After reviewing all the available data and chart,  I agree with her findings, examination as well as impression recommendations.  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 On the night following her catheterization she had acute onset of A. fib with RVR which replicated her pains. She was initially continued  on heparin while started on warfarin. Her beta blocker was increased and she spontaneously converted. She has otherwise remained clinically stable with only mild bradycardia while sleeping.  For now I think were safe with low-dose beta blocker plus low-dose calcium channel blocker combination. She is noted to be somewhat bradycardic in the outpatient setting would probably reduce her Cardizem to 60 mg twice a day.  Due to a drop in hemoglobin/anemia from admission, her heparin was stopped prior to completing the 48 hours. We will stop aspirin for discharge. Hemoccult was mildly positive but she has not had any melena.  He has walked up and down the hallway without any problems or complaints. She's not had any concerning abdominal symptoms to suggest GI bleed. At this point I think she is stable for discharge. She is going to a monitored environment [facility] where she can have blood work drawn for INR and  hemoglobin. She will have labs checked tomorrow and Monday. She will then follow-up with our clinic for management of her INRs.  If hemoglobin continues to trend down despite being off of IV heparin and aspirin, then would consider outpatient GI evaluation. I don't think that her warfarin dosing will cause her INR to rise acutely, this would then allow for any potential mild oozing to heal.   Otherwise think she is stable for discharge.  Leonie Man, M.D., M.S. Interventional Cardiologist   Pager # [phone]

## 2014-09-17 LAB — CBC
HCT: 28.5 % — ABNORMAL LOW (ref 36.0–46.0)
Hemoglobin: 9.7 g/dL — ABNORMAL LOW (ref 12.0–15.0)
MCH: 30 pg (ref 26.0–34.0)
MCHC: 34 g/dL (ref 30.0–36.0)
MCV: 88.2 fL (ref 78.0–100.0)
MPV: 11.3 fL (ref 8.6–12.4)
Platelets: 263 10*3/uL (ref 150–400)
RBC: 3.23 MIL/uL — ABNORMAL LOW (ref 3.87–5.11)
RDW: 13.6 % (ref 11.5–15.5)
WBC: 8.2 10*3/uL (ref 4.0–10.5)

## 2014-09-19 ENCOUNTER — Other Ambulatory Visit: Payer: Self-pay | Admitting: Pharmacist Clinician (PhC)/ Clinical Pharmacy Specialist

## 2014-09-20 ENCOUNTER — Ambulatory Visit (INDEPENDENT_AMBULATORY_CARE_PROVIDER_SITE_OTHER): Payer: Medicare Other | Admitting: Pharmacist Clinician (PhC)/ Clinical Pharmacy Specialist

## 2014-09-20 ENCOUNTER — Telehealth: Payer: Self-pay | Admitting: *Deleted

## 2014-09-20 DIAGNOSIS — Z7901 Long term (current) use of anticoagulants: Secondary | ICD-10-CM

## 2014-09-20 DIAGNOSIS — I480 Paroxysmal atrial fibrillation: Secondary | ICD-10-CM | POA: Diagnosis not present

## 2014-09-20 DIAGNOSIS — D500 Iron deficiency anemia secondary to blood loss (chronic): Secondary | ICD-10-CM | POA: Diagnosis not present

## 2014-09-20 LAB — POCT INR: INR: 4.5

## 2014-09-20 MED ORDER — WARFARIN SODIUM 2 MG PO TABS: ORAL_TABLET | ORAL | Status: DC

## 2014-09-20 NOTE — Telephone Encounter (Signed)
Patient was here for INR check and had questions concerning how she feels and her medication. She was questioning the change from Crestor 5mg  to Atorvastatin 20mg .  I advised that that was a comparable dose and I explained how that made sense.  She verbalized understanding.  Then she and her son began talking about their concerns over the calcification of her valve.  I explained a little bit about what that meant and that that would best be a conversation to be had at her next appt. She then reported feeling a little SOB after walking a great distance.  No other symptoms and the SOB only occurred after a great distance.  I explained that that was somewhat expected after hospitalization and the addition of her new beta blocker.  She also reports feeling fatigued occasionally, but says that it is improving everyday.  I advised her to contact us immediately with any symptoms or concerns.

## 2014-09-21 ENCOUNTER — Telehealth: Payer: Self-pay | Admitting: *Deleted

## 2014-09-21 ENCOUNTER — Telehealth: Payer: Self-pay | Admitting: Cardiovascular Disease

## 2014-09-21 DIAGNOSIS — R7989 Other specified abnormal findings of blood chemistry: Secondary | ICD-10-CM

## 2014-09-21 LAB — CBC
HCT: 28.8 % — ABNORMAL LOW (ref 36.0–46.0)
Hemoglobin: 9.6 g/dL — ABNORMAL LOW (ref 12.0–15.0)
MCH: 30.3 pg (ref 26.0–34.0)
MCHC: 33.3 g/dL (ref 30.0–36.0)
MCV: 90.9 fL (ref 78.0–100.0)
MPV: 10.6 fL (ref 8.6–12.4)
Platelets: 299 10*3/uL (ref 150–400)
RBC: 3.17 MIL/uL — ABNORMAL LOW (ref 3.87–5.11)
RDW: 14.2 % (ref 11.5–15.5)
WBC: 6.6 10*3/uL (ref 4.0–10.5)

## 2014-09-21 NOTE — Telephone Encounter (Signed)
Patient notified of lab results.  Will send to lab for a repeat CBC when she come for her PT/INT 09/27/14.  Patient voiced understanding.

## 2014-09-21 NOTE — Telephone Encounter (Signed)
Pt was discharged from the hospital last Thursday. Please call she says she have some questions.

## 2014-09-21 NOTE — Telephone Encounter (Signed)
Spoke with pt, questions regarding med changes related to recent hospitalization answered. Questions regarding diet and warfarin also answered.

## 2014-09-21 NOTE — Telephone Encounter (Signed)
-----   Message from Isaiah Serge, NP sent at 09/21/2014  8:08 AM EST ----- Stable hgb.  receck in 1 week

## 2014-09-22 ENCOUNTER — Other Ambulatory Visit: Payer: Self-pay | Admitting: Pharmacist Clinician (PhC)/ Clinical Pharmacy Specialist

## 2014-09-22 ENCOUNTER — Telehealth: Payer: Self-pay | Admitting: Cardiovascular Disease

## 2014-09-22 MED ORDER — WARFARIN SODIUM 5 MG PO TABS: ORAL_TABLET | ORAL | Status: DC

## 2014-09-22 MED ORDER — DILTIAZEM HCL ER 90 MG PO CP12: 90.0000 mg | ORAL_CAPSULE | Freq: Two times a day (BID) | ORAL | Status: DC

## 2014-09-22 MED ORDER — CARVEDILOL 6.25 MG PO TABS: 6.2500 mg | ORAL_TABLET | Freq: Two times a day (BID) | ORAL | Status: DC

## 2014-09-22 NOTE — Telephone Encounter (Signed)
Returned call to patient she stated mail order pharmacy [facility] needs to speak to Cecilie Kicks NP.Stated she did not know why.[facility] called at # [phone].Patient ID # [id number].Spoke to pharmacist she wanted to clarify Coumadin dose.Patient was told 09/20/14 to hold for 2 days then take 5 mg 1/2 tablet daily except 5 mg on Fridays.She also wanted to clarify Ferrous Sulfate.Stated they do not have 28 mg tablet.Stated they have 140 mg and 325 mg tablets.Message sent to Cecilie Kicks NP for advice.

## 2014-09-22 NOTE — Telephone Encounter (Signed)
[name] is calling because [facility] is stating that cannot refill her medication . Wants someone to clarify to dosage diltiazem 90mg  , carvedilol  6.25 mg and warfarin 5mg  ..They want it to be faxed [phone]

## 2014-09-22 NOTE — Telephone Encounter (Signed)
This message from [facility]: Please call ,pt have issues with medicine called in by her doctor.

## 2014-09-22 NOTE — Telephone Encounter (Signed)
90 day refills for carvedilol,diltiazem,and coumadin sent to [facility] ( [facility] ).

## 2014-09-22 NOTE — Telephone Encounter (Signed)
Thank you :)

## 2014-09-27 ENCOUNTER — Ambulatory Visit (INDEPENDENT_AMBULATORY_CARE_PROVIDER_SITE_OTHER): Payer: Medicare Other | Admitting: Pharmacist Clinician (PhC)/ Clinical Pharmacy Specialist

## 2014-09-27 ENCOUNTER — Other Ambulatory Visit: Payer: Self-pay | Admitting: Pharmacist Clinician (PhC)/ Clinical Pharmacy Specialist

## 2014-09-27 ENCOUNTER — Other Ambulatory Visit: Payer: Self-pay | Admitting: Cardiology

## 2014-09-27 DIAGNOSIS — I480 Paroxysmal atrial fibrillation: Secondary | ICD-10-CM

## 2014-09-27 DIAGNOSIS — Z7901 Long term (current) use of anticoagulants: Secondary | ICD-10-CM | POA: Diagnosis not present

## 2014-09-27 DIAGNOSIS — R7989 Other specified abnormal findings of blood chemistry: Secondary | ICD-10-CM | POA: Diagnosis not present

## 2014-09-27 LAB — POCT INR: INR: 4.9

## 2014-09-28 ENCOUNTER — Telehealth: Payer: Self-pay | Admitting: *Deleted

## 2014-09-28 LAB — CBC
HCT: 31.3 % — ABNORMAL LOW (ref 36.0–46.0)
Hemoglobin: 10.1 g/dL — ABNORMAL LOW (ref 12.0–15.0)
MCH: 30.1 pg (ref 26.0–34.0)
MCHC: 32.3 g/dL (ref 30.0–36.0)
MCV: 93.2 fL (ref 78.0–100.0)
MPV: 10.1 fL (ref 8.6–12.4)
Platelets: 421 10*3/uL — ABNORMAL HIGH (ref 150–400)
RBC: 3.36 MIL/uL — ABNORMAL LOW (ref 3.87–5.11)
RDW: 14.2 % (ref 11.5–15.5)
WBC: 7.5 10*3/uL (ref 4.0–10.5)

## 2014-09-28 NOTE — Telephone Encounter (Signed)
-----   Message from Isaiah Serge, NP sent at 09/28/2014  9:11 AM EST ----- Lab is stable.

## 2014-09-28 NOTE — Telephone Encounter (Signed)
Left message on answer machine.-labs are stable Will follow up at appointment 10/01/14.

## 2014-09-28 NOTE — Telephone Encounter (Signed)
Switched from 5 mg to 2 mg tabs

## 2014-10-01 ENCOUNTER — Ambulatory Visit (INDEPENDENT_AMBULATORY_CARE_PROVIDER_SITE_OTHER): Payer: Medicare Other | Admitting: Physician Assistant

## 2014-10-01 ENCOUNTER — Encounter: Payer: Self-pay | Admitting: Physician Assistant

## 2014-10-01 VITALS — BP 106/60 | HR 48 | Ht 62.0 in | Wt 128.1 lb

## 2014-10-01 DIAGNOSIS — I480 Paroxysmal atrial fibrillation: Secondary | ICD-10-CM

## 2014-10-01 DIAGNOSIS — R001 Bradycardia, unspecified: Secondary | ICD-10-CM | POA: Diagnosis not present

## 2014-10-01 DIAGNOSIS — Z9889 Other specified postprocedural states: Secondary | ICD-10-CM

## 2014-10-01 DIAGNOSIS — E785 Hyperlipidemia, unspecified: Secondary | ICD-10-CM

## 2014-10-01 DIAGNOSIS — Z7901 Long term (current) use of anticoagulants: Secondary | ICD-10-CM | POA: Diagnosis not present

## 2014-10-01 DIAGNOSIS — I10 Essential (primary) hypertension: Secondary | ICD-10-CM | POA: Diagnosis not present

## 2014-10-01 MED ORDER — ATORVASTATIN CALCIUM 10 MG PO TABS: 10.0000 mg | ORAL_TABLET | Freq: Every day | ORAL | Status: DC

## 2014-10-01 MED ORDER — CARVEDILOL 3.125 MG PO TABS: 3.1250 mg | ORAL_TABLET | Freq: Two times a day (BID) | ORAL | Status: DC

## 2014-10-01 NOTE — Patient Instructions (Addendum)
Your physician recommends that you schedule a follow-up appointment Keep appointment with Dr Claiborne Billings 11/19/2014  Your physician has recommended you make the following change in your medication: Decrease Carvedilol 3.125 twice a day, STOP Crestor and Start Atorvastatin(Lipitor) 10 mg daily

## 2014-10-01 NOTE — Assessment & Plan Note (Signed)
Decreasing Coreg but ultimately this will probably need to be stopped.

## 2014-10-01 NOTE — Assessment & Plan Note (Signed)
She is maintaining sinus bradycardia rate of 40 bpm with PVCs and PACs.  When a decrease her Coreg to 3.125 by think ultimately we will need to eliminate it altogether. Just spent $567 on Cardizem CD.  I'd hate to have to change it.  He is also now on Coumadin.

## 2014-10-01 NOTE — Progress Notes (Signed)
Patient ID: [name], female   DOB: [birth year], 81 y.o.   MRN: [id number]    Date:  10/01/2014   ID:  [name], DOB [birth year], MRN [id number]  PCP:  Horatio Pel, MD  Primary Cardiologist:  Claiborne Billings   Chief Complaint  Patient presents with  . Hospitalization Follow-up    Afib     History of Present Illness: [name] is a 81 y.o. female   [name] is a 81 y.o. female saw Dr. Claiborne Billings back in October chief complaint of shortness of breath with walking.  She has a history of hypertension, hyperlipidemia, as well as valvular heart disease. An echo Doppler study in December 2013 showed normal systolic function with an ejection fraction of 55-60%. She had mild stenosis of her aortic valve, moderately severe calcified anulus of the mitral valve with moderate MR, moderately severe LA dilatation, mild-to-moderate RA dilatation, moderate tricuspid regurgitation and moderately severe pulmonary hypertension with PA pressure estimated at 66 mm. Last year, he started her on amlodipine at 2.5 mg. She felt that she was breathing better since initiating this therapy and her blood pressure had markedly improved.  A followup echo Doppler study on 11/16/2013. This showed normal systolic function with an ejection fraction at 60-65%. Diastolic parameters were normal. She did have mitral annular calcification with moderate mitral regurgitation. The left atrium was moderately dilated, the right atrium was moderately dilated, and her tricuspid stenosis was now considered severe. Pulmonary pressures were slightly reduced from previously, but were still elevated at 61 mm.  She was hospitalized from January 25 to the 28th 2016 with chest pain troponin elevation up to 1.15. She underwent left heart catheterization which showed essentially normal coronary arteries. Percent EF was 70% with near mid cavity obliteration and 3+ MR.  During this stay she was noted to have  paroxysmal atrial fibrillation with RVR. This was thought to be the cause of her NSTEMI.  She converted to normal sinus rhythm after being on IV diltiazem drip. She was then changed to by mouth Cardizem CD amlodipine was stopped. She was noted to have a 4 beat run of SVT. Chads*score of 4 she was placed on Coumadin.  Patient presents today for posthospital patient. EKG shows sinus bradycardia PVCs a rate of 48 bpm. She for reports she's been feeling better for the last 2 days. Her breathing is okay.  She's not had any chest pain.  She's had one episode of mild dizziness. She had the Cardizem CD prescription filled and it cost her $567 for 3 months.  She otherwise denies nausea, vomiting, fever, orthopnea, dizziness, PND, cough, congestion, abdominal pain, hematochezia, melena, lower extremity edema, claudication.   Lipid Panel     Component Value Date/Time   CHOL 136 09/14/2014 0501   TRIG 119 09/14/2014 0501   HDL 50 09/14/2014 0501   CHOLHDL 2.7 09/14/2014 0501   VLDL 24 09/14/2014 0501   LDLCALC 62 09/14/2014 0501     Wt Readings from Last 3 Encounters:  10/01/14 128 lb 1.6 oz (58.106 kg)  09/16/14 125 lb 9.6 oz (56.972 kg)  09/02/14 129 lb 9.6 oz (58.786 kg)     Past Medical History  Diagnosis Date  . Hypertension   . Hyperlipidemia   . GERD (gastroesophageal reflux disease)   . Anxiety   . Mitral prolapse   . Degenerative arthritis   . Neuropathy   . Peripheral neuropathy     Small fiber   . Diverticulosis   .  Mild renal insufficiency   . Hx of echocardiogram 07/30/2012    EF 55-60%; mild LVH; mild AV stenosis & trivial regurg.; mod MV regurg.; LA severly dilated; RV systolic pressure increased consistent with severe pulm. hypertension; RA moderately dilated; mod TV regurg.;   . History of nuclear stress test 09/19/2007    normal pattern of perfusion; post-stress EF 86%; EKG negative for ischemia; low risk scan  . Carotid arterial disease 07/30/2012    carotid doppler;  normal study  . Foot fracture, left   . Memory disorder 03/05/2014  . Chronic anticoagulation, with coumadin, with PAF and CHADS2Vasc2 score of 4 09/16/2014  . S/P cardiac cath 09/14/14 mild calcification of ostial RCA but otherwise normal coronary arteries 09/14/2014  . Anemia, iron deficiency 09/16/2014    Current Outpatient Prescriptions  Medication Sig Dispense Refill  . benazepril (LOTENSIN) 40 MG tablet Take 1 tablet (40 mg total) by mouth daily. 90 tablet 3  . Biotin 1 MG CAPS Take 1 capsule every day 30 capsule 6  . Calcium Carbonate-Vitamin D (CALTRATE 600+D) 600-400 MG-UNIT per tablet Take 1 tablet by mouth daily.     . cetirizine (ZYRTEC) 10 MG tablet Take 10 mg by mouth daily.    . cholecalciferol (VITAMIN D) 1000 UNITS tablet Take 1,000 Units by mouth daily.      . colestipol (COLESTID) 1 G tablet Take 1 g by mouth 2 (two) times daily as needed. For diarrhea     . cycloSPORINE (RESTASIS) 0.05 % ophthalmic emulsion Place 1 drop into both eyes daily.      Marland Kitchen diltiazem (CARDIZEM SR) 90 MG 12 hr capsule Take 1 capsule (90 mg total) by mouth every 12 (twelve) hours. 180 capsule 3  . diphenhydrAMINE (BENADRYL) 25 mg capsule Take 25 mg by mouth at bedtime.      . fenofibrate micronized (LOFIBRA) 134 MG capsule Take 1 capsule (134 mg total) by mouth daily. 90 capsule 3  . Ferrous Sulfate (IRON) 28 MG TABS Take 1 tablet (28 mg total) by mouth 2 (two) times daily. 60 each 6  . Flaxseed, Linseed, (FLAXSEED OIL) 1000 MG CAPS Take 3 capsules by mouth daily. 2 @ lunch and 1 @ dinner    . flunisolide (NASALIDE) 0.025 % SOLN Inhale 1 spray into the lungs daily.      Marland Kitchen gabapentin (NEURONTIN) 300 MG capsule Take 1 capsule three times daily and take 3 capsules at bedtime 540 capsule 3  . hydrochlorothiazide (HYDRODIURIL) 25 MG tablet Take 0.5 tablets (12.5 mg total) by mouth 2 (two) times daily. (Patient taking differently: Take 25 mg by mouth daily. ) 90 tablet 3  . HYDROcodone-acetaminophen  (NORCO/VICODIN) 5-325 MG per tablet Take 1 tablet by mouth every 6 (six) hours.     . hydroxypropyl methylcellulose (ISOPTO TEARS) 2.5 % ophthalmic solution Place 1 drop into both eyes as needed for dry eyes.    . Lactobacillus (ACIDOPHILUS) 100 MG CAPS Take 100 mg by mouth daily.     . lansoprazole (PREVACID) 15 MG capsule Take 15 mg by mouth daily.      . Multiple Vitamin (MULTIVITAMIN) tablet Take 1 tablet by mouth daily.      Marland Kitchen pyridOXINE (VITAMIN B-6) 100 MG tablet Take 100 mg by mouth daily.    . simethicone (MYLICON) 664 MG chewable tablet Chew 125 mg by mouth as needed for flatulence.    . vitamin B-12 (CYANOCOBALAMIN) 1000 MCG tablet Take 1,000 mcg by mouth daily.    Marland Kitchen warfarin (  COUMADIN) 2 MG tablet Take 1 tablet by mouth daily or as directed by coumadin clinic 30 tablet 1  . warfarin (COUMADIN) 5 MG tablet Take as directed 90 tablet 3  . atorvastatin (LIPITOR) 10 MG tablet Take 1 tablet (10 mg total) by mouth daily. 30 tablet 6  . carvedilol (COREG) 3.125 MG tablet Take 1 tablet (3.125 mg total) by mouth 2 (two) times daily. 60 tablet 6   No current facility-administered medications for this visit.    Allergies:    Allergies  Allergen Reactions  . Avelox [Moxifloxacin Hcl In Nacl] Shortness Of Breath and Swelling  . Codeine Swelling  . Garlic     Gi problems  . Latex     Watery blisters   . Onion     Gi problems  . Polysporin [Bacitracin-Polymyxin B]   . Sulfa Drugs Cross Reactors Swelling    Social History:  The patient  reports that she has quit smoking. She has never used smokeless tobacco. She reports that she does not drink alcohol or use illicit drugs.   Family history:   Family History  Problem Relation Age of Onset  . Pneumonia Mother   . Coronary artery disease Mother   . Heart disease Father   . Cancer Father   . Arthritis Sister   . Hypertension Mother   . Hypertension Father     ROS:  Please see the history of present illness.  All other systems  reviewed and negative.   PHYSICAL EXAM: VS:  BP 106/60 mmHg  Pulse 48  Ht 5\' 2"  (1.575 m)  Wt 128 lb 1.6 oz (58.106 kg)  BMI 23.42 kg/m2 Well nourished, well developed, in no acute distress HEENT: Pupils are equal round react to light accommodation extraocular movements are intact.  Neck: no JVDNo cervical lymphadenopathy. Cardiac: Regular rate and rhythm 2/6 systolic murmur Lungs:  clear to auscultation bilaterally, no wheezing, rhonchi or rales Abd: soft, nontender, positive bowel sounds all quadrants, no hepatosplenomegaly Ext: no lower extremity edema.  2+ radial and dorsalis pedis pulses. Skin: warm and dry Neuro:  Grossly normal  EKG:  Sinus bradycardia with PVCs and PACs. Rate 48 bpm  ASSESSMENT AND PLAN:  Problem List Items Addressed This Visit    S/P cardiac cath 09/14/14 mild calcification of ostial RCA but otherwise normal coronary arteries   PAF (paroxysmal atrial fibrillation)    She is maintaining sinus bradycardia rate of 40 bpm with PVCs and PACs.  When a decrease her Coreg to 3.125 by think ultimately we will need to eliminate it altogether. Just spent $567 on Cardizem CD.  I'd hate to have to change it.  He is also now on Coumadin.      Relevant Medications   carvedilol (COREG) tablet   atorvastatin (LIPITOR) tablet   Hyperlipidemia (Chronic)    Continue Crestor to Lipitor so she is on something generic.  Last Lipid Panel     Component Value Date/Time   CHOL 136 09/14/2014 0501   TRIG 119 09/14/2014 0501   HDL 50 09/14/2014 0501   CHOLHDL 2.7 09/14/2014 0501   VLDL 24 09/14/2014 0501   LDLCALC 62 09/14/2014 0501          Relevant Medications   carvedilol (COREG) tablet   atorvastatin (LIPITOR) tablet   Essential hypertension (Chronic)    Blood pressure is very well controlled.      Relevant Medications   carvedilol (COREG) tablet   atorvastatin (LIPITOR) tablet   Chronic anticoagulation, with  coumadin, with PAF and CHADS2Vasc2 score of 4    Bradycardia    Decreasing Coreg but ultimately this will probably need to be stopped.       Other Visit Diagnoses    Paroxysmal atrial fibrillation    -  Primary    Relevant Medications    carvedilol (COREG) tablet    atorvastatin (LIPITOR) tablet    Other Relevant Orders    EKG 12-Lead

## 2014-10-01 NOTE — Assessment & Plan Note (Signed)
Blood pressure is very well controlled.

## 2014-10-01 NOTE — Assessment & Plan Note (Signed)
Continue Crestor to Lipitor so she is on something generic.  Last Lipid Panel     Component Value Date/Time   CHOL 136 09/14/2014 0501   TRIG 119 09/14/2014 0501   HDL 50 09/14/2014 0501   CHOLHDL 2.7 09/14/2014 0501   VLDL 24 09/14/2014 0501   LDLCALC 62 09/14/2014 0501

## 2014-10-06 DIAGNOSIS — H04123 Dry eye syndrome of bilateral lacrimal glands: Secondary | ICD-10-CM | POA: Diagnosis not present

## 2014-10-06 DIAGNOSIS — H2513 Age-related nuclear cataract, bilateral: Secondary | ICD-10-CM | POA: Diagnosis not present

## 2014-10-06 DIAGNOSIS — H40013 Open angle with borderline findings, low risk, bilateral: Secondary | ICD-10-CM | POA: Diagnosis not present

## 2014-10-07 DIAGNOSIS — I480 Paroxysmal atrial fibrillation: Secondary | ICD-10-CM | POA: Diagnosis not present

## 2014-10-07 LAB — PROTIME-INR
INR: 2 — AB (ref 0.9–1.1)
INR: 2 — AB (ref <=1.1)

## 2014-10-08 ENCOUNTER — Ambulatory Visit (INDEPENDENT_AMBULATORY_CARE_PROVIDER_SITE_OTHER): Payer: Medicare Other | Admitting: Pharmacist Clinician (PhC)/ Clinical Pharmacy Specialist

## 2014-10-08 DIAGNOSIS — Z7901 Long term (current) use of anticoagulants: Secondary | ICD-10-CM

## 2014-10-08 DIAGNOSIS — I480 Paroxysmal atrial fibrillation: Secondary | ICD-10-CM

## 2014-10-14 DIAGNOSIS — I480 Paroxysmal atrial fibrillation: Secondary | ICD-10-CM | POA: Diagnosis not present

## 2014-10-14 DIAGNOSIS — M79674 Pain in right toe(s): Secondary | ICD-10-CM | POA: Diagnosis not present

## 2014-10-14 DIAGNOSIS — B351 Tinea unguium: Secondary | ICD-10-CM | POA: Diagnosis not present

## 2014-10-14 DIAGNOSIS — Z7901 Long term (current) use of anticoagulants: Secondary | ICD-10-CM | POA: Diagnosis not present

## 2014-10-14 LAB — PROTIME-INR
INR: 1.3 — AB (ref 0.9–1.1)
INR: 1.3 — AB (ref <=1.1)

## 2014-10-15 ENCOUNTER — Telehealth: Payer: Self-pay | Admitting: Cardiology

## 2014-10-15 ENCOUNTER — Ambulatory Visit (INDEPENDENT_AMBULATORY_CARE_PROVIDER_SITE_OTHER): Payer: PRIVATE HEALTH INSURANCE | Admitting: Pharmacist Clinician (PhC)/ Clinical Pharmacy Specialist

## 2014-10-15 DIAGNOSIS — Z7901 Long term (current) use of anticoagulants: Secondary | ICD-10-CM

## 2014-10-15 DIAGNOSIS — I480 Paroxysmal atrial fibrillation: Secondary | ICD-10-CM

## 2014-10-15 NOTE — Telephone Encounter (Signed)
The patient called and was very confused about her warfarin instructions.  I read them back to her from the Warfarin note and told her the warfarin clinic would call on Monday again to clarify.  She likely does not have enough 2 mg pills to take an increased dose for more than a few days.

## 2014-10-18 ENCOUNTER — Telehealth: Payer: Self-pay | Admitting: Cardiovascular Disease

## 2014-10-18 MED ORDER — WARFARIN SODIUM 2 MG PO TABS: ORAL_TABLET | ORAL | Status: DC

## 2014-10-18 NOTE — Telephone Encounter (Signed)
Please call,needs instruction on how to take her Carvedilol,Diltiazem and Warfarin.

## 2014-10-18 NOTE — Telephone Encounter (Signed)
Reviewed with patient on when to take warfarin, carvedilol and diltiazem

## 2014-10-18 NOTE — Telephone Encounter (Signed)
Called, NA.

## 2014-10-18 NOTE — Telephone Encounter (Signed)
Called, NA & no answering machine.

## 2014-10-21 DIAGNOSIS — I480 Paroxysmal atrial fibrillation: Secondary | ICD-10-CM | POA: Diagnosis not present

## 2014-10-21 DIAGNOSIS — Z7901 Long term (current) use of anticoagulants: Secondary | ICD-10-CM | POA: Diagnosis not present

## 2014-10-21 LAB — POCT INR: INR: 1.2

## 2014-10-22 ENCOUNTER — Ambulatory Visit (INDEPENDENT_AMBULATORY_CARE_PROVIDER_SITE_OTHER): Payer: PRIVATE HEALTH INSURANCE | Admitting: Pharmacist Clinician (PhC)/ Clinical Pharmacy Specialist

## 2014-10-22 DIAGNOSIS — Z7901 Long term (current) use of anticoagulants: Secondary | ICD-10-CM

## 2014-10-22 DIAGNOSIS — I480 Paroxysmal atrial fibrillation: Secondary | ICD-10-CM

## 2014-10-22 MED ORDER — WARFARIN SODIUM 2 MG PO TABS: ORAL_TABLET | ORAL | Status: DC

## 2014-10-24 ENCOUNTER — Other Ambulatory Visit: Payer: Self-pay | Admitting: Cardiology

## 2014-10-28 DIAGNOSIS — I480 Paroxysmal atrial fibrillation: Secondary | ICD-10-CM | POA: Diagnosis not present

## 2014-10-28 DIAGNOSIS — Z7901 Long term (current) use of anticoagulants: Secondary | ICD-10-CM | POA: Diagnosis not present

## 2014-10-28 LAB — PROTIME-INR: INR: 1.5 — AB (ref <=1.1)

## 2014-10-29 ENCOUNTER — Ambulatory Visit (INDEPENDENT_AMBULATORY_CARE_PROVIDER_SITE_OTHER): Payer: PRIVATE HEALTH INSURANCE | Admitting: Pharmacist Clinician (PhC)/ Clinical Pharmacy Specialist

## 2014-10-29 DIAGNOSIS — I480 Paroxysmal atrial fibrillation: Secondary | ICD-10-CM

## 2014-10-29 DIAGNOSIS — Z7901 Long term (current) use of anticoagulants: Secondary | ICD-10-CM

## 2014-11-04 DIAGNOSIS — I480 Paroxysmal atrial fibrillation: Secondary | ICD-10-CM | POA: Diagnosis not present

## 2014-11-04 DIAGNOSIS — Z7901 Long term (current) use of anticoagulants: Secondary | ICD-10-CM | POA: Diagnosis not present

## 2014-11-04 LAB — PROTIME-INR
INR: 1.6 — AB (ref <=1.1)
INR: 1.7 — AB (ref 0.9–1.1)

## 2014-11-05 ENCOUNTER — Telehealth: Payer: Self-pay | Admitting: Cardiology

## 2014-11-05 NOTE — Telephone Encounter (Signed)
Pt called saying her INR was checked but nobody called. There is no INR under results. I suggested she take 3 mg tonight, 2 mg Sat and Sun and call office Monday.  Kerin Ransom PA-C 11/05/2014 8:03 PM

## 2014-11-08 ENCOUNTER — Telehealth: Payer: Self-pay | Admitting: Pharmacist Clinician (PhC)/ Clinical Pharmacy Specialist

## 2014-11-08 ENCOUNTER — Ambulatory Visit (INDEPENDENT_AMBULATORY_CARE_PROVIDER_SITE_OTHER): Payer: PRIVATE HEALTH INSURANCE | Admitting: Pharmacist Clinician (PhC)/ Clinical Pharmacy Specialist

## 2014-11-08 DIAGNOSIS — Z7901 Long term (current) use of anticoagulants: Secondary | ICD-10-CM

## 2014-11-08 DIAGNOSIS — I480 Paroxysmal atrial fibrillation: Secondary | ICD-10-CM

## 2014-11-08 NOTE — Telephone Encounter (Signed)
Mrs Linthicum is calling to get directions on how much of her warfrain to take . Please leave a detailed msg for her , she has to go out . Please call  Thanks

## 2014-11-08 NOTE — Telephone Encounter (Signed)
See anticoag note

## 2014-11-11 DIAGNOSIS — Z7901 Long term (current) use of anticoagulants: Secondary | ICD-10-CM | POA: Diagnosis not present

## 2014-11-11 DIAGNOSIS — I5033 Acute on chronic diastolic (congestive) heart failure: Secondary | ICD-10-CM | POA: Diagnosis not present

## 2014-11-11 DIAGNOSIS — D689 Coagulation defect, unspecified: Secondary | ICD-10-CM | POA: Diagnosis not present

## 2014-11-11 LAB — POCT INR: INR: 1.93

## 2014-11-11 LAB — PROTIME-INR

## 2014-11-12 ENCOUNTER — Ambulatory Visit (INDEPENDENT_AMBULATORY_CARE_PROVIDER_SITE_OTHER): Payer: PRIVATE HEALTH INSURANCE | Admitting: Pharmacist Clinician (PhC)/ Clinical Pharmacy Specialist

## 2014-11-12 DIAGNOSIS — Z7901 Long term (current) use of anticoagulants: Secondary | ICD-10-CM

## 2014-11-12 DIAGNOSIS — I480 Paroxysmal atrial fibrillation: Secondary | ICD-10-CM

## 2014-11-18 DIAGNOSIS — Z7901 Long term (current) use of anticoagulants: Secondary | ICD-10-CM | POA: Diagnosis not present

## 2014-11-18 DIAGNOSIS — I480 Paroxysmal atrial fibrillation: Secondary | ICD-10-CM | POA: Diagnosis not present

## 2014-11-18 LAB — PROTIME-INR
INR: 1.2 — AB (ref 0.9–1.1)
INR: 1.2 — AB (ref <=1.1)

## 2014-11-19 ENCOUNTER — Ambulatory Visit (INDEPENDENT_AMBULATORY_CARE_PROVIDER_SITE_OTHER): Payer: Medicare Other | Admitting: Pharmacist Clinician (PhC)/ Clinical Pharmacy Specialist

## 2014-11-19 ENCOUNTER — Telehealth: Payer: Self-pay | Admitting: Pharmacist Clinician (PhC)/ Clinical Pharmacy Specialist

## 2014-11-19 ENCOUNTER — Ambulatory Visit (INDEPENDENT_AMBULATORY_CARE_PROVIDER_SITE_OTHER): Payer: Medicare Other | Admitting: Cardiovascular Disease

## 2014-11-19 VITALS — BP 126/60 | HR 58 | Ht 62.0 in | Wt 131.8 lb

## 2014-11-19 DIAGNOSIS — Z7901 Long term (current) use of anticoagulants: Secondary | ICD-10-CM

## 2014-11-19 DIAGNOSIS — R001 Bradycardia, unspecified: Secondary | ICD-10-CM

## 2014-11-19 DIAGNOSIS — I38 Endocarditis, valve unspecified: Secondary | ICD-10-CM

## 2014-11-19 DIAGNOSIS — Z9889 Other specified postprocedural states: Secondary | ICD-10-CM

## 2014-11-19 DIAGNOSIS — I480 Paroxysmal atrial fibrillation: Secondary | ICD-10-CM | POA: Diagnosis not present

## 2014-11-19 DIAGNOSIS — I340 Nonrheumatic mitral (valve) insufficiency: Secondary | ICD-10-CM

## 2014-11-19 NOTE — Patient Instructions (Signed)
Your physician wants you to follow-up in: 6 months or sooner if needed. You will receive a reminder letter in the mail two months in advance. If you don't receive a letter, please call our office to schedule the follow-up appointment. 

## 2014-11-19 NOTE — Telephone Encounter (Signed)
Patient is unclear about her Coumadin dosage.  Please call

## 2014-11-19 NOTE — Telephone Encounter (Signed)
Repeated information to pt, pt voiced understanding.

## 2014-11-20 ENCOUNTER — Encounter: Payer: Self-pay | Admitting: Cardiovascular Disease

## 2014-11-20 NOTE — Progress Notes (Signed)
Patient ID: [name], female   DOB: [birth year], 81 y.o.   MRN: [id number]      HPI: [name] is a 81 y.o. female presents to the office today for a 6 month followup cardiology evaluation and follow-up of her recent hospitalization.  [name] has a history of hypertension, hyperlipidemia, as well as valvular heart disease. An echo Doppler study in December 2013 showed normal systolic function with an ejection fraction of 55-60%. She had mild stenosis of her aortic valve, moderately severe calcified anulus of the mitral valve with moderate MR, moderately severe LA dilatation,  mild-to-moderate RA dilatation, moderate tricuspid regurgitation and moderately severe pulmonary hypertension with PA pressure estimated at 66 mm. Last year, I started her on amlodipine at 2.5 mg.  She felt that she was breathing better since initiating this therapy and  her blood pressure had markedly improved. A followup echo Doppler study on 11/16/2013 showed normal systolic function with an ejection fraction at 60-65%.  Diastolic parameters were normal.  She had mitral annular calcification with moderate mitral regurgitation.  The left atrium was moderately dilated, the right atrium was moderately dilated, and her tricuspid stenosis was now considered severe.  Pulmonary pressures were slightly reduced from previously, but were still elevated at 61 mm.  When I last saw her in October 2015.  She denied any chest pain but had shortness of breath with walking.  She had been taking the low-dose diuretic, benazepril 40 mg, atenolol 25 mg in the morning 12.5 mg at night, amlodipine, 2.5 mg, HCTZ 12.5 mg twice a day and her ECG revealed marked sinus bradycardia which led to reduction in her atenolol dose to 12.5 mg twice a day.  She also has hyperlipidemia, and had beenon Crestor 5 mg in addition to fenofibrate 134 mg.   She was recently admitted to the hospital on 09/13/2014 and discharged on gender 9 2016.  On the  day of admission she awakened from sleep with midsternal all chest discomfort which he initially felt was indigestion.  Her troponin was mildly elevated at 0.23, which increased to 1.15 and there were new inferior T-wave abnormalities.  She was felt to have suffered a non-ST segment elevation MI.  She also developed paroxysmal atrial fibrillation with RVR, which resulted in similar symptomatology as her admission and was felt most likely that this may have been the cause for her admission.  I performed cardiac catheterization on gender 26 2016.  Fluoroscopy revealed severe mitral annular calcification and very mild calcification in the region of the aortic valve.  She had hyperdynamic LV function with an ejection fraction of a proximally 70%.  There was severe mitral annular calcification with 3+ MR.  There was mild calcification in the region of the RCA ostium, but otherwise normal-appearing coronary arteries.  Right heart catheterization was performed and her wedge pressure mean was 10.  LV pressure was 152/11.  Pulmonary artery pressure was 33/12.  Cardiac output was 3.3 L/m by the thermodilution method and 3.7 L/m by the Fick method.  She sagittally saw [name] in the office following her hospitalization.  She was maintaining sinus rhythm with sinus bradycardia and at that time, she had been on carvedilol and her dose was reduced.  She has continued to be on Cardizem SR 90 mg every 12 hours.  An INR was obtained yesterday and this was set therapeutic at 1.18 and her Coumadin dose was adjusted.  She presents for evaluation   Past Medical History  Diagnosis Date  . Hypertension   . Hyperlipidemia   . GERD (gastroesophageal reflux disease)   . Anxiety   . Mitral prolapse   . Degenerative arthritis   . Neuropathy   . Peripheral neuropathy     Small fiber   . Diverticulosis   . Mild renal insufficiency   . Hx of echocardiogram 07/30/2012    EF 55-60%; mild LVH; mild AV stenosis & trivial  regurg.; mod MV regurg.; LA severly dilated; RV systolic pressure increased consistent with severe pulm. hypertension; RA moderately dilated; mod TV regurg.;   . History of nuclear stress test 09/19/2007    normal pattern of perfusion; post-stress EF 86%; EKG negative for ischemia; low risk scan  . Carotid arterial disease 07/30/2012    carotid doppler; normal study  . Foot fracture, left   . Memory disorder 03/05/2014  . Chronic anticoagulation, with coumadin, with PAF and CHADS2Vasc2 score of 4 09/16/2014  . S/P cardiac cath 09/14/14 mild calcification of ostial RCA but otherwise normal coronary arteries 09/14/2014  . Anemia, iron deficiency 09/16/2014    Past Surgical History  Procedure Laterality Date  . Abdominal hysterectomy    . Replacement total knee Right   . Tear duct probing with strabismus repair      Tear duct repair surgery  . Gallbladder resection    . Tonsillectomy    . Appendectomy    . Resuspension procedure    .  bilateral bunionectomies    . Foot fracture surgery Left   . Dilation and curettage of uterus    . Umbilical hernia repair    . Left heart catheterization with coronary angiogram N/A 09/14/2014    Procedure: LEFT HEART CATHETERIZATION WITH CORONARY ANGIOGRAM;  Surgeon: Angie Sine, MD;  Location: [facility] CATH LAB;  Service: Cardiovascular;  Laterality: N/A;    Allergies  Allergen Reactions  . Avelox [Moxifloxacin Hcl In Nacl] Shortness Of Breath and Swelling  . Codeine Swelling  . Garlic     Gi problems  . Latex     Watery blisters   . Onion     Gi problems  . Polysporin [Bacitracin-Polymyxin B]   . Sulfa Drugs Cross Reactors Swelling    Current Outpatient Prescriptions  Medication Sig Dispense Refill  . atorvastatin (LIPITOR) 10 MG tablet Take 1 tablet (10 mg total) by mouth daily. 30 tablet 6  . benazepril (LOTENSIN) 40 MG tablet Take 1 tablet (40 mg total) by mouth daily. 90 tablet 3  . Biotin 1 MG CAPS Take 1 capsule every day 30 capsule 6  .  Calcium Carbonate-Vitamin D (CALTRATE 600+D) 600-400 MG-UNIT per tablet Take 1 tablet by mouth daily.     . carvedilol (COREG) 3.125 MG tablet Take 1 tablet (3.125 mg total) by mouth 2 (two) times daily. 60 tablet 6  . cetirizine (ZYRTEC) 10 MG tablet Take 10 mg by mouth daily.    . cholecalciferol (VITAMIN D) 1000 UNITS tablet Take 1,000 Units by mouth daily.      . colestipol (COLESTID) 1 G tablet Take 1 g by mouth 2 (two) times daily as needed. For diarrhea     . cycloSPORINE (RESTASIS) 0.05 % ophthalmic emulsion Place 1 drop into both eyes daily.      Marland Kitchen diltiazem (CARDIZEM SR) 90 MG 12 hr capsule TAKE ONE CAPSULE BY MOUTH EVERY 12 HOURS 30 capsule 0  . diphenhydrAMINE (BENADRYL) 25 mg capsule Take 25 mg by mouth at bedtime.      . fenofibrate  micronized (LOFIBRA) 134 MG capsule Take 1 capsule (134 mg total) by mouth daily. 90 capsule 3  . Ferrous Sulfate (IRON) 28 MG TABS Take 1 tablet (28 mg total) by mouth 2 (two) times daily. 60 each 6  . Flaxseed, Linseed, (FLAXSEED OIL) 1000 MG CAPS Take 3 capsules by mouth daily. 2 @ lunch and 1 @ dinner    . flunisolide (NASALIDE) 0.025 % SOLN Inhale 1 spray into the lungs daily.      Marland Kitchen gabapentin (NEURONTIN) 300 MG capsule Take 1 capsule three times daily and take 3 capsules at bedtime 540 capsule 3  . hydrochlorothiazide (HYDRODIURIL) 25 MG tablet Take 0.5 tablets (12.5 mg total) by mouth 2 (two) times daily. (Patient taking differently: Take 25 mg by mouth daily. ) 90 tablet 3  . HYDROcodone-acetaminophen (NORCO/VICODIN) 5-325 MG per tablet Take 1 tablet by mouth every 6 (six) hours.     . hydroxypropyl methylcellulose (ISOPTO TEARS) 2.5 % ophthalmic solution Place 1 drop into both eyes as needed for dry eyes.    . Lactobacillus (ACIDOPHILUS) 100 MG CAPS Take 100 mg by mouth daily.     . lansoprazole (PREVACID) 15 MG capsule Take 15 mg by mouth daily.      . Multiple Vitamin (MULTIVITAMIN) tablet Take 1 tablet by mouth daily.      Marland Kitchen pyridOXINE  (VITAMIN B-6) 100 MG tablet Take 100 mg by mouth daily.    . simethicone (MYLICON) 791 MG chewable tablet Chew 125 mg by mouth as needed for flatulence.    . vitamin B-12 (CYANOCOBALAMIN) 1000 MCG tablet Take 1,000 mcg by mouth daily.    Marland Kitchen warfarin (COUMADIN) 2 MG tablet Take 1 to 1.5 tablets by mouth daily as directed by coumadin clinic 30 tablet 0   No current facility-administered medications for this visit.    Socially she is widowed. Has 2 children and 2 grandchildren. She does walk. Is no tobacco or alcohol use. [facility] resides [facility]. She remains active.  ROS General: Negative; No fevers, chills, or night sweats;  HEENT: Negative; No changes in vision or hearing, sinus congestion, difficulty swallowing Pulmonary: Negative; No cough, wheezing, hemoptysis Cardiovascular: See history of present illness No chest pain, presyncope, syncope, palpitations GI: Negative; No nausea, vomiting, diarrhea, or abdominal pain GU: Negative; No dysuria, hematuria, or difficulty voiding Musculoskeletal: Negative; no myalgias, joint pain, or weakness Hematologic/Oncology: Negative; no easy bruising, bleeding Endocrine: Negative; no heat/cold intolerance; no diabetes Neuro: Negative; no changes in balance, headaches Skin: Negative; No rashes or skin lesions Psychiatric: Negative; No behavioral problems, depression Sleep: Negative; No snoring, daytime sleepiness, hypersomnolence, bruxism, restless legs, hypnogognic hallucinations, no cataplexy Other comprehensive 14 point system review is negative.   PE BP 126/60 mmHg  Pulse 58  Ht _0  (1.575 m)  Wt 131 lb 12.8 oz (59.784 kg)  BMI 24.10 kg/m2 Repeat blood pressure by me was 140/64 General: Alert, oriented, no distress.  Skin: normal turgor, no rashes HEENT: Normocephalic, atraumatic. Pupils round and reactive; sclera anicteric;no lid lag.  Nose without nasal septal hypertrophy Mouth/Parynx benign; Mallinpatti scale 2  Neck: No JVD,  trace right carotid bruit; normal carotid upstroke Chest wall: No tenderness to palpation Lungs: clear to ausculatation and percussion; no wheezing or rales Heart: RRR, s1 s2 normal; 2/6 systolic murmur in aortic region. No S3 gallop.  No rubs, thrills or heaves.  No hepatic jugular reflux. Abdomen: soft, nontender; no hepatosplenomehaly, BS+; abdominal aorta nontender and not dilated by palpation. Back: No CVA  tenderness  Pulses 2+ Extremities: No ankle edema. no clubbing cyanosis , Homan's sign negative  Neurologic: grossly nonfocal Psyhological: Normal affect and mood   ECG (independently read by me): Sinus bradycardia 58 bpm.  Left axis deviation.  LVH by voltage criteria in aVL.  No significant ST segment changes.  October 2015 ECG (independently read by me): Sinus bradycardia 48 beats per minute.  No ectopy.  PR interval 164 ms  Prior April 2015 ECG (independently by me) sinus bradycardia 50 beats per minute.  No ectopy.  Normal intervals.  No ST segment changes.  Prior 09/02/2013 ECG (independently read by me ): Sinus bradycardia at 51 beats per minute. Left axis deviation. No significant ST change. PR interval 160 ms. QTc interval 418 ms.   LABS:  BMET  BMP Latest Ref Rng 09/14/2014 09/13/2014 04/02/2008  Glucose 70 - 99 mg/dL 101(H) 102(H) 113(H)  BUN 6 - 23 mg/dL 23 24(H) 8  Creatinine 0.50 - 1.10 mg/dL 0.75 0.94 0.82  Sodium 135 - 145 mmol/L 142 141 140  Potassium 3.5 - 5.1 mmol/L 3.5 3.6 3.6  Chloride 96 - 112 mmol/L 105 106 104  CO2 19 - 32 mmol/L 28 28 32  Calcium 8.4 - 10.5 mg/dL 10.2 10.0 8.9     Hepatic Function Panel   Hepatic Function Latest Ref Rng 09/14/2014 03/24/2008  Total Protein 6.0 - 8.3 g/dL 7.0 7.3  Albumin 3.5 - 5.2 g/dL 4.0 4.2  AST 0 - 37 U/L 33 22  ALT 0 - 35 U/L 16 20  Alk Phosphatase 39 - 117 U/L 28(L) 41  Total Bilirubin 0.3 - 1.2 mg/dL 0.7 0.8     CBC  CBC Latest Ref Rng 09/27/2014 09/20/2014 09/16/2014  WBC 4.0 - 10.5 K/uL 7.5 6.6 8.2    Hemoglobin 12.0 - 15.0 g/dL 10.1(L) 9.6(L) 9.7(L)  Hematocrit 36.0 - 46.0 % 31.3(L) 28.8(L) 28.5(L)  Platelets 150 - 400 K/uL 421(H) 299 263     BNP No results found for: PROBNP   Lipid Panel     Component Value Date/Time   CHOL 136 09/14/2014 0501   TRIG 119 09/14/2014 0501   HDL 50 09/14/2014 0501   CHOLHDL 2.7 09/14/2014 0501   VLDL 24 09/14/2014 0501   LDLCALC 62 09/14/2014 0501     RADIOLOGY: No results found.    ASSESSMENT AND PLAN: Ms. Nold is now 81 years old.  He has a history of hypertension, hyperlipidemia, as well as significant mitral annular calcification with MR.  I reviewed her recent hospitalization records in detail.  During that admission she was noted to have paroxysmal atrial fibrillation with rapid ventricular response which may have been the inciting cause of her chest pain which awakened her from sleep.  Cardiac catheterization did not reveal significant coronary obstructive disease.  Following diuresis.  She did not have significant pulmonary hypertension and her PA pressure on right heart catheterization was only 33 mm systolically.  She is maintaining sinus rhythm with mild sinus bradycardia at 58 bpm .  On her current therapy consisting of carvedilol 3.125 mmol twice a day, Cardizem slow release 90 mg every 12 hours.  She also is on HCTZ 12.5 mg twice a day in addition to her, but has a real 40 mg.  Her blood pressure is stable.  Her laboratory from her hospitalization was reviewed.  She is on Coumadin anticoagulation for her PAF and her Coumadin dose was adjusted in light of her subtherapeutic INR.  She's not having any bleeding.  She is on combination therapy with atorvastatin 10 mg and fenofibrate therapy for hyperlipidemia with LDL 62, most recent triglycerides 119.  She now feels much more active and has been going to the pool where she is exercising with water walking.  She also is resumed playing the piano.  As long as she remains stable, I will  see her in 6 months for reevaluation or sooner if problems arise.  Angie Sine, MD, [facility]  11/20/2014 2:32 PM

## 2014-11-23 ENCOUNTER — Telehealth: Payer: Self-pay | Admitting: Pharmacist Clinician (PhC)/ Clinical Pharmacy Specialist

## 2014-11-23 NOTE — Telephone Encounter (Signed)
When does she need her next INR?

## 2014-11-24 NOTE — Telephone Encounter (Signed)
Order faxed to [facility] for INR this Thursday

## 2014-11-25 DIAGNOSIS — I480 Paroxysmal atrial fibrillation: Secondary | ICD-10-CM | POA: Diagnosis not present

## 2014-11-25 DIAGNOSIS — Z7901 Long term (current) use of anticoagulants: Secondary | ICD-10-CM | POA: Diagnosis not present

## 2014-11-25 LAB — PROTIME-INR: INR: 1.3 — AB (ref 0.9–1.1)

## 2014-11-26 ENCOUNTER — Ambulatory Visit (INDEPENDENT_AMBULATORY_CARE_PROVIDER_SITE_OTHER): Payer: Medicare Other | Admitting: Pharmacist Clinician (PhC)/ Clinical Pharmacy Specialist

## 2014-11-26 DIAGNOSIS — I480 Paroxysmal atrial fibrillation: Secondary | ICD-10-CM

## 2014-11-26 DIAGNOSIS — Z7901 Long term (current) use of anticoagulants: Secondary | ICD-10-CM

## 2014-12-02 ENCOUNTER — Ambulatory Visit (INDEPENDENT_AMBULATORY_CARE_PROVIDER_SITE_OTHER): Payer: Medicare Other | Admitting: Pharmacist Clinician (PhC)/ Clinical Pharmacy Specialist

## 2014-12-02 DIAGNOSIS — I480 Paroxysmal atrial fibrillation: Secondary | ICD-10-CM

## 2014-12-02 DIAGNOSIS — Z7901 Long term (current) use of anticoagulants: Secondary | ICD-10-CM | POA: Diagnosis not present

## 2014-12-02 LAB — PROTIME-INR: INR: 2 — AB (ref 0.9–1.1)

## 2014-12-04 ENCOUNTER — Other Ambulatory Visit: Payer: Self-pay | Admitting: Cardiovascular Disease

## 2014-12-06 ENCOUNTER — Telehealth: Payer: Self-pay | Admitting: Cardiovascular Disease

## 2014-12-06 MED ORDER — CARVEDILOL 3.125 MG PO TABS: 3.1250 mg | ORAL_TABLET | Freq: Two times a day (BID) | ORAL | Status: DC

## 2014-12-06 NOTE — Telephone Encounter (Signed)
Please call,Carvedilol ,what milligram is she suppose to be taking?

## 2014-12-06 NOTE — Telephone Encounter (Signed)
Rx(s) sent to pharmacy electronically [facility]) Patient aware her current dose of carvedilol is 3.125mg  BID

## 2014-12-08 DIAGNOSIS — R634 Abnormal weight loss: Secondary | ICD-10-CM | POA: Diagnosis not present

## 2014-12-08 DIAGNOSIS — R197 Diarrhea, unspecified: Secondary | ICD-10-CM | POA: Diagnosis not present

## 2014-12-09 DIAGNOSIS — R140 Abdominal distension (gaseous): Secondary | ICD-10-CM | POA: Diagnosis not present

## 2014-12-09 DIAGNOSIS — M159 Polyosteoarthritis, unspecified: Secondary | ICD-10-CM | POA: Diagnosis not present

## 2014-12-09 DIAGNOSIS — I4891 Unspecified atrial fibrillation: Secondary | ICD-10-CM | POA: Diagnosis not present

## 2014-12-10 ENCOUNTER — Telehealth: Payer: Self-pay | Admitting: Pharmacist Clinician (PhC)/ Clinical Pharmacy Specialist

## 2014-12-10 NOTE — Telephone Encounter (Signed)
Pt LMOM asking how to take her diltiazem.  Read instructions over phone "take 1 capsule, 90mg , every 12 hours" and then stated she didn't know what that meant.  Returned call, patient states she now understands it to be 1 whole capsule every 12 hours, she thought she was supposed to break it in half somehow.

## 2014-12-16 ENCOUNTER — Ambulatory Visit (INDEPENDENT_AMBULATORY_CARE_PROVIDER_SITE_OTHER): Payer: Medicare Other | Admitting: Pharmacist Clinician (PhC)/ Clinical Pharmacy Specialist

## 2014-12-16 DIAGNOSIS — Z7901 Long term (current) use of anticoagulants: Secondary | ICD-10-CM

## 2014-12-16 DIAGNOSIS — I480 Paroxysmal atrial fibrillation: Secondary | ICD-10-CM

## 2014-12-16 LAB — PROTIME-INR: INR: 2.8 — AB (ref 0.9–1.1)

## 2014-12-27 ENCOUNTER — Telehealth: Payer: Self-pay | Admitting: Cardiovascular Disease

## 2014-12-27 NOTE — Telephone Encounter (Signed)
Pt called in stating that Diltiazem is going to cost her $390 for 3 months and she would like to know is there another medication she can take that is cheaper. Please call back  Thanks

## 2014-12-27 NOTE — Telephone Encounter (Signed)
Pt. States Diltiazem is going to cost her $390.00 for three months, is there a cheaper alternative that she can take?

## 2014-12-30 ENCOUNTER — Ambulatory Visit (INDEPENDENT_AMBULATORY_CARE_PROVIDER_SITE_OTHER): Payer: Medicare Other | Admitting: Pharmacist Clinician (PhC)/ Clinical Pharmacy Specialist

## 2014-12-30 DIAGNOSIS — Z7901 Long term (current) use of anticoagulants: Secondary | ICD-10-CM | POA: Diagnosis not present

## 2014-12-30 DIAGNOSIS — I480 Paroxysmal atrial fibrillation: Secondary | ICD-10-CM | POA: Diagnosis not present

## 2014-12-30 LAB — PROTIME-INR: INR: 2 — AB (ref 0.9–1.1)

## 2015-01-02 NOTE — Telephone Encounter (Signed)
Diltiazem is a generic; what med in similar group is on her formulary?

## 2015-01-03 NOTE — Telephone Encounter (Signed)
I talked to [name] and she suggested that we try Tiazac 180 mg daily instead of diltazem 90 mg bid, please advise

## 2015-01-06 NOTE — Telephone Encounter (Signed)
Has this been taken care of?

## 2015-01-13 ENCOUNTER — Ambulatory Visit (INDEPENDENT_AMBULATORY_CARE_PROVIDER_SITE_OTHER): Payer: Medicare Other | Admitting: Pharmacist Clinician (PhC)/ Clinical Pharmacy Specialist

## 2015-01-13 DIAGNOSIS — I480 Paroxysmal atrial fibrillation: Secondary | ICD-10-CM | POA: Diagnosis not present

## 2015-01-13 DIAGNOSIS — Z7901 Long term (current) use of anticoagulants: Secondary | ICD-10-CM | POA: Diagnosis not present

## 2015-01-13 LAB — PROTIME-INR: INR: 2.4 — AB (ref 0.9–1.1)

## 2015-01-14 ENCOUNTER — Other Ambulatory Visit: Payer: Self-pay | Admitting: *Deleted

## 2015-01-14 MED ORDER — DILTIAZEM HCL ER BEADS 180 MG PO CP24: 180.0000 mg | ORAL_CAPSULE | Freq: Every day | ORAL | Status: DC

## 2015-01-14 NOTE — Telephone Encounter (Signed)
Called and notified patient that  Her diltiazem 90 mg has been changed to Tiazac 180 mg. She can complete the diltiazem and start the tiazac once she has completed it. Patient voiced understanding of this.

## 2015-01-14 NOTE — Telephone Encounter (Signed)
Switch patient to Tiazac 180 mg qd for cost savings.  Let patient know of change

## 2015-01-19 DIAGNOSIS — R197 Diarrhea, unspecified: Secondary | ICD-10-CM | POA: Diagnosis not present

## 2015-01-25 ENCOUNTER — Other Ambulatory Visit: Payer: Self-pay | Admitting: Cardiology

## 2015-01-25 ENCOUNTER — Other Ambulatory Visit: Payer: Self-pay | Admitting: Cardiovascular Disease

## 2015-01-25 MED ORDER — HYDROCHLOROTHIAZIDE 25 MG PO TABS: 25.0000 mg | ORAL_TABLET | Freq: Every day | ORAL | Status: DC

## 2015-01-25 NOTE — Telephone Encounter (Signed)
Rx(s) sent to pharmacy electronically.  

## 2015-01-25 NOTE — Addendum Note (Signed)
Addended by: Diana Eves on: 01/25/2015 03:09 PM   Modules accepted: Orders

## 2015-01-31 ENCOUNTER — Telehealth: Payer: Self-pay | Admitting: Cardiovascular Disease

## 2015-01-31 NOTE — Telephone Encounter (Signed)
Pt calling back again.she need to find out how she is suppose be taking her Carvedilol and her Diltiazem please.Please call before 2 today, if possible.

## 2015-01-31 NOTE — Telephone Encounter (Signed)
Reviewed most recent dosing from last OV, following telephone notes.  Pt given instruction on meds. She verbalized understanding.

## 2015-01-31 NOTE — Telephone Encounter (Signed)
Pt called in wanting to speak with Mariann Laster about her Carvedilol dosage. She says that she has two bottle with 2 different strengths. One has 3.12mg  and the other 6.25mg  and they both say take 1 tab po a day. Please f/u with her   Thanks

## 2015-02-02 ENCOUNTER — Other Ambulatory Visit: Payer: Self-pay | Admitting: *Deleted

## 2015-02-04 ENCOUNTER — Encounter: Payer: Self-pay | Admitting: Podiatry

## 2015-02-04 ENCOUNTER — Ambulatory Visit (INDEPENDENT_AMBULATORY_CARE_PROVIDER_SITE_OTHER): Payer: Medicare Other | Admitting: Podiatry

## 2015-02-04 VITALS — BP 114/52 | HR 59 | Resp 18

## 2015-02-04 DIAGNOSIS — M79675 Pain in left toe(s): Secondary | ICD-10-CM

## 2015-02-04 DIAGNOSIS — B351 Tinea unguium: Secondary | ICD-10-CM | POA: Diagnosis not present

## 2015-02-04 DIAGNOSIS — M79674 Pain in right toe(s): Secondary | ICD-10-CM

## 2015-02-04 DIAGNOSIS — M79676 Pain in unspecified toe(s): Secondary | ICD-10-CM

## 2015-02-05 NOTE — Progress Notes (Signed)
Patient ID: LYN JOENS, female   DOB: 1922/04/10, 81 y.o.   MRN: [id number] Complaint:  Visit Type: Patient returns to my office for continued preventative foot care services. Complaint: Patient states" my nails have grown long and thick and become painful to walk and wear shoes" Patient has been diagnosed with neuropathy.  She has also been under care for ulcer under ball of right foot. She presents to the office for preventive foot care services and says her ulcer hal healed with no pain or drainage.  Marland Kitchen No changes to ROS  Podiatric Exam: Vascular: dorsalis pedis and posterior tibial pulses are palpable bilateral. Capillary return is immediate. Temperature gradient is WNL. Skin turgor WNL  Sensorium: Normal Semmes Weinstein monofilament test. Normal tactile sensation bilaterally. Nail Exam: Pt has thick disfigured discolored nails with subungual debris noted bilateral entire nail hallux  Ulcer Exam: There is no evidence of ulcer or pre-ulcerative changes or infection. Orthopedic Exam: Muscle tone and strength are WNL. No limitations in general ROM. No crepitus or effusions noted. Foot type and digits show no abnormalities. Bony prominences are unremarkable. Skin: No Porokeratosis. No infection or ulcers  Diagnosis:  Tinea unguium, Pain in right toe, pain in left toes  Treatment & Plan Procedures and Treatment: Consent by patient was obtained for treatment procedures. The patient understood the discussion of treatment and procedures well. All questions were answered thoroughly reviewed. Debridement of mycotic and hypertrophic toenails, 1 through 5 bilateral and clearing of subungual debris. No ulceration, no infection noted.  Return Visit-Office Procedure: Patient instructed to return to the office for a follow up visit 10 weeks for continued evaluation and treatment.

## 2015-02-07 ENCOUNTER — Telehealth: Payer: Self-pay | Admitting: Cardiovascular Disease

## 2015-02-07 MED ORDER — WARFARIN SODIUM 2 MG PO TABS: ORAL_TABLET | ORAL | Status: DC

## 2015-02-07 NOTE — Telephone Encounter (Signed)
°  1. Which medications need to be refilled? Warfarin 2 mg   2. Which pharmacy is medication to be sent to?[facility] on [address] and [address]   3. Do they need a 30 day or 90 day supply? [facility]  4. Would they like a call back once the medication has been sent to the pharmacy? Yes

## 2015-02-07 NOTE — Telephone Encounter (Signed)
Left message on pt's voicemail indicating refills sent to both pharmacies as requested.

## 2015-02-10 ENCOUNTER — Ambulatory Visit (INDEPENDENT_AMBULATORY_CARE_PROVIDER_SITE_OTHER): Payer: Medicare Other | Admitting: Internal Medicine

## 2015-02-10 DIAGNOSIS — Z7901 Long term (current) use of anticoagulants: Secondary | ICD-10-CM | POA: Diagnosis not present

## 2015-02-10 DIAGNOSIS — I480 Paroxysmal atrial fibrillation: Secondary | ICD-10-CM | POA: Diagnosis not present

## 2015-02-10 LAB — PROTIME-INR: INR: 2.1 — AB (ref 0.9–1.1)

## 2015-02-11 ENCOUNTER — Telehealth: Payer: Self-pay | Admitting: Cardiovascular Disease

## 2015-02-11 MED ORDER — WARFARIN SODIUM 2 MG PO TABS: ORAL_TABLET | ORAL | Status: DC

## 2015-02-11 NOTE — Telephone Encounter (Signed)
°  1. Which medications need to be refilled? Warfarin  2. Which pharmacy is medication to be sent to?[facility]  3. Do they need a 30 day or 90 day supply? 90   4. Would they like a call back once the medication has been sent to the pharmacy? Yes-leave a message if she is not there

## 2015-02-11 NOTE — Telephone Encounter (Signed)
°  1. Which medications need to be refilled? Warfarin-needs enough until her mail order comes in 2. Which pharmacy is [facility] to be [facility] to[facility]  3. Do they need a 30 day or 90 day supply? #30  4. Would they like a call back once the medication has been sent to the pharmacy? yes

## 2015-02-11 NOTE — Telephone Encounter (Signed)
Called pt to let her know refill was sent to pharmacy

## 2015-02-11 NOTE — Telephone Encounter (Signed)
Rx sent 

## 2015-02-11 NOTE — Telephone Encounter (Signed)
Message sent to Coumadin clinic.

## 2015-02-28 ENCOUNTER — Telehealth: Payer: Self-pay | Admitting: Neurology

## 2015-02-28 MED ORDER — GABAPENTIN 300 MG PO CAPS: ORAL_CAPSULE | ORAL | Status: DC

## 2015-02-28 NOTE — Telephone Encounter (Signed)
Pt calling and needs refill on gabapentin (NEURONTIN) 300 MG capsule. She has an appt. This week but will not have enough till the. Please call  [phone]

## 2015-02-28 NOTE — Telephone Encounter (Signed)
Rx has been sent.  I called back to advise.  Patient is aware.

## 2015-03-03 ENCOUNTER — Encounter: Payer: Self-pay | Admitting: Neurology

## 2015-03-03 ENCOUNTER — Ambulatory Visit (INDEPENDENT_AMBULATORY_CARE_PROVIDER_SITE_OTHER): Payer: Medicare Other | Admitting: Neurology

## 2015-03-03 VITALS — BP 116/53 | HR 55 | Ht 61.0 in | Wt 125.8 lb

## 2015-03-03 DIAGNOSIS — R413 Other amnesia: Secondary | ICD-10-CM | POA: Diagnosis not present

## 2015-03-03 DIAGNOSIS — R269 Unspecified abnormalities of gait and mobility: Secondary | ICD-10-CM | POA: Diagnosis not present

## 2015-03-03 DIAGNOSIS — G63 Polyneuropathy in diseases classified elsewhere: Secondary | ICD-10-CM | POA: Diagnosis not present

## 2015-03-03 MED ORDER — DONEPEZIL HCL 5 MG PO TABS: 5.0000 mg | ORAL_TABLET | Freq: Every day | ORAL | Status: DC

## 2015-03-03 NOTE — Progress Notes (Signed)
Reason for visit: Peripheral neuropathy  [name] is an 81 y.o. female  History of present illness:  [name] is a 81 year old right-handed white female with a history of a peripheral neuropathy that appears to be well controlled with gabapentin. The patient indicates that occasionally, she will have increased discomfort at nighttime but in general she sleeps well. The patient has some mild gait instability, she denies any falls, she does not require an assistive device to ambulate. The patient has ongoing problems with her memory, she is having increasing problems with short-term memory, remembering names for people and places, and she will misplace things about the house frequently. She has not given up any activities of daily living because of memory since last seen. She returns to this office for an evaluation.  Past Medical History  Diagnosis Date  . Hypertension   . Hyperlipidemia   . GERD (gastroesophageal reflux disease)   . Anxiety   . Mitral prolapse   . Degenerative arthritis   . Neuropathy   . Peripheral neuropathy     Small fiber   . Diverticulosis   . Mild renal insufficiency   . Hx of echocardiogram 07/30/2012    EF 55-60%; mild LVH; mild AV stenosis & trivial regurg.; mod MV regurg.; LA severly dilated; RV systolic pressure increased consistent with severe pulm. hypertension; RA moderately dilated; mod TV regurg.;   . History of nuclear stress test 09/19/2007    normal pattern of perfusion; post-stress EF 86%; EKG negative for ischemia; low risk scan  . Carotid arterial disease 07/30/2012    carotid doppler; normal study  . Foot fracture, left   . Memory disorder 03/05/2014  . Chronic anticoagulation, with coumadin, with PAF and CHADS2Vasc2 score of 4 09/16/2014  . S/P cardiac cath 09/14/14 mild calcification of ostial RCA but otherwise normal coronary arteries 09/14/2014  . Anemia, iron deficiency 09/16/2014    Past Surgical History  Procedure  Laterality Date  . Abdominal hysterectomy    . Replacement total knee Right   . Tear duct probing with strabismus repair      Tear duct repair surgery  . Gallbladder resection    . Tonsillectomy    . Appendectomy    . Resuspension procedure    .  bilateral bunionectomies    . Foot fracture surgery Left   . Dilation and curettage of uterus    . Umbilical hernia repair    . Left heart catheterization with coronary angiogram N/A 09/14/2014    Procedure: LEFT HEART CATHETERIZATION WITH CORONARY ANGIOGRAM;  Surgeon: Troy Sine, MD;  Location: [facility] CATH LAB;  Service: Cardiovascular;  Laterality: N/A;    Family History  Problem Relation Age of Onset  . Pneumonia Mother   . Coronary artery disease Mother   . Heart disease Father   . Cancer Father   . Arthritis Sister   . Hypertension Mother   . Hypertension Father     Social history:  reports that she has quit smoking. She has never used smokeless tobacco. She reports that she does not drink alcohol or use illicit drugs.    Allergies  Allergen Reactions  . Avelox [Moxifloxacin Hcl In Nacl] Shortness Of Breath and Swelling  . Codeine Swelling  . Garlic     Gi problems  . Latex     Watery blisters   . Onion     Gi problems  . Polysporin [Bacitracin-Polymyxin B]   . Sulfa Drugs Cross Reactors Swelling  Medications:  Prior to Admission medications   Medication Sig Start Date End Date Taking? Authorizing Provider  atorvastatin (LIPITOR) 10 MG tablet Take 1 tablet (10 mg total) by mouth daily. 10/01/14  Yes Brett Canales, PA-C  benazepril (LOTENSIN) 40 MG tablet Take 1 tablet (40 mg total) by mouth daily. 03/06/13  Yes Troy Sine, MD  Biotin 1 MG CAPS Take 1 capsule every day 09/22/14  Yes Troy Sine, MD  Calcium Carbonate-Vitamin D (CALTRATE 600+D) 600-400 MG-UNIT per tablet Take 1 tablet by mouth daily.    Yes Historical Provider, MD  carvedilol (COREG) 3.125 MG tablet Take 1 tablet (3.125 mg total) by mouth 2 (two)  times daily. 12/06/14  Yes Troy Sine, MD  carvedilol (COREG) 6.25 MG tablet  11/30/14  Yes Historical Provider, MD  cetirizine (ZYRTEC) 10 MG tablet Take 10 mg by mouth daily.   Yes Historical Provider, MD  cholecalciferol (VITAMIN D) 1000 UNITS tablet Take 1,000 Units by mouth daily.     Yes Historical Provider, MD  colestipol (COLESTID) 1 G tablet Take 1 g by mouth 2 (two) times daily as needed. For diarrhea    Yes Historical Provider, MD  cycloSPORINE (RESTASIS) 0.05 % ophthalmic emulsion Place 1 drop into both eyes daily.     Yes Historical Provider, MD  diltiazem (CARDIZEM SR) 90 MG 12 hr capsule  12/27/14  Yes Historical Provider, MD  diltiazem (TIAZAC) 180 MG 24 hr capsule Take 1 capsule (180 mg total) by mouth daily. 01/14/15  Yes Troy Sine, MD  diphenhydrAMINE (BENADRYL) 25 mg capsule Take 25 mg by mouth at bedtime.     Yes Historical Provider, MD  fenofibrate micronized (LOFIBRA) 134 MG capsule Take 1 capsule (134 mg total) by mouth daily. 01/01/14  Yes Troy Sine, MD  Ferrous Sulfate (IRON) 28 MG TABS Take 1 tablet (28 mg total) by mouth 2 (two) times daily. 09/16/14  Yes Isaiah Serge, NP  Flaxseed, Linseed, (FLAXSEED OIL) 1000 MG CAPS Take 3 capsules by mouth daily. 2 @ lunch and 1 @ dinner   Yes Historical Provider, MD  flunisolide (NASALIDE) 0.025 % SOLN Inhale 1 spray into the lungs daily.     Yes Historical Provider, MD  gabapentin (NEURONTIN) 300 MG capsule Take 1 capsule three times daily and take 3 capsules at bedtime 02/28/15  Yes Kathrynn Ducking, MD  hydrochlorothiazide (HYDRODIURIL) 25 MG tablet Take 1 tablet (25 mg total) by mouth daily. 01/25/15  Yes Troy Sine, MD  HYDROcodone-acetaminophen (NORCO/VICODIN) 5-325 MG per tablet Take 1 tablet by mouth every 6 (six) hours.  05/06/13  Yes Historical Provider, MD  hydroxypropyl methylcellulose (ISOPTO TEARS) 2.5 % ophthalmic solution Place 1 drop into both eyes as needed for dry eyes.   Yes Historical Provider, MD    Lactobacillus (ACIDOPHILUS) 100 MG CAPS Take 100 mg by mouth daily.    Yes Historical Provider, MD  lansoprazole (PREVACID) 15 MG capsule Take 15 mg by mouth daily.     Yes Historical Provider, MD  Multiple Vitamin (MULTIVITAMIN) tablet Take 1 tablet by mouth daily.     Yes Historical Provider, MD  Multiple Vitamins-Minerals (OCUVITE PRESERVISION PO) Take 1 capsule by mouth daily.   Yes Historical Provider, MD  pyridOXINE (VITAMIN B-6) 100 MG tablet Take 100 mg by mouth daily.   Yes Historical Provider, MD  simethicone (MYLICON) 350 MG chewable tablet Chew 125 mg by mouth as needed for flatulence.   Yes Historical Provider, MD  vitamin B-12 (CYANOCOBALAMIN) 1000 MCG tablet Take 1,000 mcg by mouth daily.   Yes Historical Provider, MD  warfarin (COUMADIN) 2 MG tablet Take 1.5 to 2 tablets by mouth daily as directed by coumadin clinic 02/11/15  Yes Troy Sine, MD    ROS:  Out of a complete 14 system review of symptoms, the patient complains only of the following symptoms, and all other reviewed systems are negative.  Ringing in the ears Leg swelling Swollen abdomen Diarrhea Snoring Food allergies Incontinence of the bladder Joint pain, joint swelling, back pain, muscle cramps Itching Bruising easily Memory loss, numbness, speech difficulty Confusion, anxiety  Blood pressure 116/53, pulse 55, height 5\' 1"  (1.549 m), weight 125 lb 12.8 oz (57.063 kg).  Physical Exam  General: The patient is alert and cooperative at the time of the examination.  Skin: No significant peripheral edema is noted.   Neurologic Exam  Mental status: The patient is alert and oriented x 3 at the time of the examination. The patient has apparent normal recent and remote memory, with an apparently normal attention span and concentration ability. Mini-Mental Status Examination done today shows a total score 28/30. The patient is able to name 6 animals in 30 seconds.   Cranial nerves: Facial symmetry is  present. Speech is normal, no aphasia or dysarthria is noted. Extraocular movements are full. Visual fields are full.  Motor: The patient has good strength in all 4 extremities.  Sensory examination: Soft touch sensation is symmetric on the face, arms, and legs.  Coordination: The patient has good finger-nose-finger and heel-to-shin bilaterally.  Gait and station: The patient has a normal gait. Tandem gait is unsteady. Romberg is negative. No drift is seen.  Reflexes: Deep tendon reflexes are symmetric, but are depressed.   Assessment/Plan:  1. Memory disturbance  2. Peripheral neuropathy  3. Mild gait disturbance  The patient overall is doing well with her peripheral neuropathy, the gabapentin seems to be effective in controlling her pain and discomfort. Her gait is slightly unsteady, but the patient maintains good safety. The memory issue is gradually progressive, the patient does wish to start a medication for memory at this time. We will initiate Aricept, the patient will contact our office if she is having side effects on the medication. If she is doing well after one month, she will call for a prescription for the 10 mg tablets. She will follow-up otherwise in 6 months, sooner if needed.  Jill Alexanders MD 03/03/2015 8:23 PM  [facility] [address] [place], [place] [address]  Phone [phone] Fax [phone]

## 2015-03-03 NOTE — Patient Instructions (Addendum)
Begin Aricept (donepezil) at 5 mg at night for one month. If this medication is well-tolerated, please call our office and we will call in a prescription for the 10 mg tablets. Look out for side effects that may include nausea, diarrhea, weight loss, or stomach cramps. This medication will also cause a runny nose, therefore there is no need for allergy medications for this purpose.  Peripheral Neuropathy Peripheral neuropathy is a type of nerve damage. It affects nerves that carry signals between the spinal cord and other parts of the body. These are called peripheral nerves. With peripheral neuropathy, one nerve or a group of nerves may be damaged.  CAUSES  Many things can damage peripheral nerves. For some people with peripheral neuropathy, the cause is unknown. Some causes include:  Diabetes. This is the most common cause of peripheral neuropathy.  Injury to a nerve.  Pressure or stress on a nerve that lasts a long time.  Too little vitamin B. Alcoholism can lead to this.  Infections.  Autoimmune diseases, such as multiple sclerosis and systemic lupus erythematosus.  Inherited nerve diseases.  Some medicines, such as cancer drugs.  Toxic substances, such as lead and mercury.  Too little blood flowing to the legs.  Kidney disease.  Thyroid disease. SIGNS AND SYMPTOMS  Different people have different symptoms. The symptoms you have will depend on which of your nerves is damaged. Common symptoms include:  Loss of feeling (numbness) in the feet and hands.  Tingling in the feet and hands.  Pain that burns.  Very sensitive skin.  Weakness.  Not being able to move a part of the body (paralysis).  Muscle twitching.  Clumsiness or poor coordination.  Loss of balance.  Not being able to control your bladder.  Feeling dizzy.  Sexual problems. DIAGNOSIS  Peripheral neuropathy is a symptom, not a disease. Finding the cause of peripheral neuropathy can be hard. To  figure that out, your health care provider will take a medical history and do a physical exam. A neurological exam will also be done. This involves checking things affected by your brain, spinal cord, and nerves (nervous system). For example, your health care provider will check your reflexes, how you move, and what you can feel.  Other types of tests may also be ordered, such as:  Blood tests.  A test of the fluid in your spinal cord.  Imaging tests, such as CT scans or an MRI.  Electromyography (EMG). This test checks the nerves that control muscles.  Nerve conduction velocity tests. These tests check how fast messages pass through your nerves.  Nerve biopsy. A small piece of nerve is removed. It is then checked under a microscope. TREATMENT   Medicine is often used to treat peripheral neuropathy. Medicines may include:  Pain-relieving medicines. Prescription or over-the-counter medicine may be suggested.  Antiseizure medicine. This may be used for pain.  Antidepressants. These also may help ease pain from neuropathy.  Lidocaine. This is a numbing medicine. You might wear a patch or be given a shot.  Mexiletine. This medicine is typically used to help control irregular heart rhythms.  Surgery. Surgery may be needed to relieve pressure on a nerve or to destroy a nerve that is causing pain.  Physical therapy to help movement.  Assistive devices to help movement. HOME CARE INSTRUCTIONS   Only take over-the-counter or prescription medicines as directed by your health care provider. Follow the instructions carefully for any given medicines. Do not take any other medicines  without first getting approval from your health care provider.  If you have diabetes, work closely with your health care provider to keep your blood sugar under control.  If you have numbness in your feet:  Check every day for signs of injury or infection. Watch for redness, warmth, and swelling.  Wear padded  socks and comfortable shoes. These help protect your feet.  Do not do things that put pressure on your damaged nerve.  Do not smoke. Smoking keeps blood from getting to damaged nerves.  Avoid or limit alcohol. Too much alcohol can cause a lack of B vitamins. These vitamins are needed for healthy nerves.  Develop a good support system. Coping with peripheral neuropathy can be stressful. Talk to a mental health specialist or join a support group if you are struggling.  Follow up with your health care provider as directed. SEEK MEDICAL CARE IF:   You have new signs or symptoms of peripheral neuropathy.  You are struggling emotionally from dealing with peripheral neuropathy.  You have a fever. SEEK IMMEDIATE MEDICAL CARE IF:   You have an injury or infection that is not healing.  You feel very dizzy or begin vomiting.  You have chest pain.  You have trouble breathing. Document Released: 07/27/2002 Document Revised: 04/18/2011 Document Reviewed: 04/13/2013 [facility] Patient Information 2015 [place], [place]. This information is not intended to replace advice given to you by your health care provider. Make sure you discuss any questions you have with your health care provider.

## 2015-03-22 ENCOUNTER — Other Ambulatory Visit: Payer: Self-pay | Admitting: Cardiovascular Disease

## 2015-03-22 ENCOUNTER — Other Ambulatory Visit: Payer: Self-pay | Admitting: Neurology

## 2015-03-22 NOTE — Telephone Encounter (Signed)
Rx(s) sent to pharmacy electronically.  

## 2015-03-23 ENCOUNTER — Other Ambulatory Visit: Payer: Self-pay | Admitting: Cardiovascular Disease

## 2015-03-23 ENCOUNTER — Telehealth: Payer: Self-pay | Admitting: Neurology

## 2015-03-23 MED ORDER — FENOFIBRATE MICRONIZED 134 MG PO CAPS: 134.0000 mg | ORAL_CAPSULE | Freq: Every day | ORAL | Status: DC

## 2015-03-23 NOTE — Telephone Encounter (Signed)
Patient called requesting 90 day supply for gabapentin (NEURONTIN) 300 MG capsule sent to [facility].

## 2015-03-23 NOTE — Telephone Encounter (Signed)
Rx(s) sent to pharmacy electronically. Patient notified. 

## 2015-03-23 NOTE — Telephone Encounter (Signed)
°  1. Which medications need to be refilled? Fenofibrate 2. Which pharmacy is medication to be sent to? [facility]  3. Do they need a 30 day or 90 day supply? 90 and refills  4. Would they like a call back once the medication has been sent to the pharmacy? yes

## 2015-03-23 NOTE — Telephone Encounter (Signed)
Disregard previous message. I called patient to let her know medication approval was sent to pharmacy on 03/22/15.

## 2015-03-24 ENCOUNTER — Telehealth: Payer: Self-pay | Admitting: Neurology

## 2015-03-24 DIAGNOSIS — Z7901 Long term (current) use of anticoagulants: Secondary | ICD-10-CM | POA: Diagnosis not present

## 2015-03-24 DIAGNOSIS — Z5181 Encounter for therapeutic drug level monitoring: Secondary | ICD-10-CM | POA: Diagnosis not present

## 2015-03-24 DIAGNOSIS — I480 Paroxysmal atrial fibrillation: Secondary | ICD-10-CM | POA: Diagnosis not present

## 2015-03-24 LAB — PROTIME-INR: INR: 1.7 — AB (ref 0.9–1.1)

## 2015-03-24 NOTE — Telephone Encounter (Signed)
Pt called stating she is having a problem with donepezil (ARICEPT) 5 MG tablet, slept well until she was put on this medication, now she is not sleeping and is very restless, if she took earlier in the day would that help? Or what can she do?

## 2015-03-24 NOTE — Telephone Encounter (Signed)
I called the patient. The patient is having difficulty sleeping on Aricept. I have recommended that she switch the dose to the morning instead of the evening to see if this improves the symptoms. If not, she is to discontinue the medication and contact our office.

## 2015-03-25 ENCOUNTER — Ambulatory Visit (INDEPENDENT_AMBULATORY_CARE_PROVIDER_SITE_OTHER): Payer: Medicare Other | Admitting: Pharmacist Clinician (PhC)/ Clinical Pharmacy Specialist

## 2015-03-25 DIAGNOSIS — I480 Paroxysmal atrial fibrillation: Secondary | ICD-10-CM

## 2015-03-25 DIAGNOSIS — Z7901 Long term (current) use of anticoagulants: Secondary | ICD-10-CM

## 2015-03-29 ENCOUNTER — Telehealth: Payer: Self-pay | Admitting: Neurology

## 2015-03-29 NOTE — Telephone Encounter (Signed)
I called patient. She is to stop the Aricept, taking at nighttime caused difficulty with sleeping, during the daytime it caused diarrhea.

## 2015-03-29 NOTE — Telephone Encounter (Signed)
I called the patient. She stated she is having terrible diarrhea and has been ever since she began taking Aricept in the morning. Per Dr. Jannifer Franklin' telephone note (03/24/15), the patient was to try taking Aricept in the morning and if she was unable to tolerate it then she was to stop. I advised that she not take anymore Aricept and that I would check with Dr. Jannifer Franklin to see if we could switch her to another medication.

## 2015-03-29 NOTE — Telephone Encounter (Signed)
Patient is again calling about the donepezil 5 mg.  She states she has tried to take the medication but she also has had diarrhea off and on for the past 2 weeks.  Please call.  Thanks!

## 2015-04-04 DIAGNOSIS — Z7901 Long term (current) use of anticoagulants: Secondary | ICD-10-CM | POA: Diagnosis not present

## 2015-04-04 DIAGNOSIS — I480 Paroxysmal atrial fibrillation: Secondary | ICD-10-CM | POA: Diagnosis not present

## 2015-04-04 LAB — PROTIME-INR: INR: 2.1 — AB (ref 0.9–1.1)

## 2015-04-07 ENCOUNTER — Ambulatory Visit (INDEPENDENT_AMBULATORY_CARE_PROVIDER_SITE_OTHER): Payer: Medicare Other | Admitting: Pharmacist Clinician (PhC)/ Clinical Pharmacy Specialist

## 2015-04-07 DIAGNOSIS — Z7901 Long term (current) use of anticoagulants: Secondary | ICD-10-CM

## 2015-04-07 DIAGNOSIS — I480 Paroxysmal atrial fibrillation: Secondary | ICD-10-CM

## 2015-04-08 ENCOUNTER — Encounter: Payer: Self-pay | Admitting: Podiatry

## 2015-04-08 ENCOUNTER — Ambulatory Visit (INDEPENDENT_AMBULATORY_CARE_PROVIDER_SITE_OTHER): Payer: Medicare Other | Admitting: Podiatry

## 2015-04-08 VITALS — BP 124/50 | HR 50 | Resp 16

## 2015-04-08 DIAGNOSIS — M79674 Pain in right toe(s): Secondary | ICD-10-CM

## 2015-04-08 DIAGNOSIS — M79676 Pain in unspecified toe(s): Secondary | ICD-10-CM | POA: Diagnosis not present

## 2015-04-08 DIAGNOSIS — M79675 Pain in left toe(s): Secondary | ICD-10-CM

## 2015-04-08 DIAGNOSIS — B351 Tinea unguium: Secondary | ICD-10-CM

## 2015-04-08 NOTE — Progress Notes (Signed)
Patient ID: [name], female   DOB: 12-20-1921, 81 y.o.   MRN: [id number] Complaint:  Visit Type: Patient returns to my office for continued preventative foot care services. Complaint: Patient states" my nails have grown long and thick and become painful to walk and wear shoes" Patient has been diagnosed with neuropathy.  She has also been under care for ulcer under ball of right foot. She presents to the office for preventive foot care services and says her ulcer hal healed with no pain or drainage.  Marland Kitchen No changes to ROS  Podiatric Exam: Vascular: dorsalis pedis and posterior tibial pulses are palpable bilateral. Capillary return is immediate. Temperature gradient is WNL. Skin turgor WNL  Sensorium: Normal Semmes Weinstein monofilament test. Normal tactile sensation bilaterally. Nail Exam: Pt has thick disfigured discolored nails with subungual debris noted bilateral entire nail hallux  Ulcer Exam: There is no evidence of ulcer or pre-ulcerative changes or infection. Orthopedic Exam: Muscle tone and strength are WNL. No limitations in general ROM. No crepitus or effusions noted. Foot type and digits show no abnormalities. Bony prominences are unremarkable.Severe dorsiflexed second toe right foot. Skin: No Porokeratosis. No infection or ulcers  Diagnosis:  Tinea unguium, Pain in right toe, pain in left toes  Treatment & Plan Procedures and Treatment: Consent by patient was obtained for treatment procedures. The patient understood the discussion of treatment and procedures well. All questions were answered thoroughly reviewed. Debridement of mycotic and hypertrophic toenails, 1 through 5 bilateral and clearing of subungual debris. No ulceration, no infection noted. Told her to cut hole in shoe right to relieve pressure on toe. Return Visit-Office Procedure: Patient instructed to return to the office for a follow up visit 10 weeks for continued evaluation and treatment.

## 2015-04-11 ENCOUNTER — Telehealth: Payer: Self-pay | Admitting: Cardiovascular Disease

## 2015-04-11 NOTE — Telephone Encounter (Signed)
Confirmed dose of 180mg  for cardizem ER, once daily. Pt voiced understanding/acknowledgement.

## 2015-04-11 NOTE — Telephone Encounter (Signed)
Please call,concerning her Diltiazem.Wants to know what dose she is suppose to be taking.

## 2015-05-05 DIAGNOSIS — Z7901 Long term (current) use of anticoagulants: Secondary | ICD-10-CM | POA: Diagnosis not present

## 2015-05-05 DIAGNOSIS — I480 Paroxysmal atrial fibrillation: Secondary | ICD-10-CM | POA: Diagnosis not present

## 2015-05-05 LAB — PROTIME-INR: INR: 2 — AB (ref 0.9–1.1)

## 2015-05-06 ENCOUNTER — Ambulatory Visit (INDEPENDENT_AMBULATORY_CARE_PROVIDER_SITE_OTHER): Payer: Medicare Other | Admitting: Pharmacist Clinician (PhC)/ Clinical Pharmacy Specialist

## 2015-05-06 DIAGNOSIS — I480 Paroxysmal atrial fibrillation: Secondary | ICD-10-CM

## 2015-05-06 DIAGNOSIS — Z7901 Long term (current) use of anticoagulants: Secondary | ICD-10-CM

## 2015-05-08 ENCOUNTER — Emergency Department (HOSPITAL_COMMUNITY): Payer: Medicare Other

## 2015-05-08 ENCOUNTER — Inpatient Hospital Stay (HOSPITAL_COMMUNITY)
Admission: EM | Admit: 2015-05-08 | Discharge: 2015-05-12 | DRG: 682 | Disposition: A | Discharge status: Home / Self Care | Payer: Medicare Other | Attending: Internal Medicine | Admitting: Internal Medicine

## 2015-05-08 ENCOUNTER — Encounter (HOSPITAL_COMMUNITY): Payer: Self-pay | Admitting: Emergency Medicine

## 2015-05-08 DIAGNOSIS — J810 Acute pulmonary edema: Secondary | ICD-10-CM | POA: Diagnosis not present

## 2015-05-08 DIAGNOSIS — I5030 Unspecified diastolic (congestive) heart failure: Secondary | ICD-10-CM

## 2015-05-08 DIAGNOSIS — Z79899 Other long term (current) drug therapy: Secondary | ICD-10-CM | POA: Diagnosis not present

## 2015-05-08 DIAGNOSIS — Z881 Allergy status to other antibiotic agents status: Secondary | ICD-10-CM | POA: Diagnosis not present

## 2015-05-08 DIAGNOSIS — R404 Transient alteration of awareness: Secondary | ICD-10-CM | POA: Diagnosis not present

## 2015-05-08 DIAGNOSIS — I272 Other secondary pulmonary hypertension: Secondary | ICD-10-CM | POA: Diagnosis present

## 2015-05-08 DIAGNOSIS — R0789 Other chest pain: Secondary | ICD-10-CM

## 2015-05-08 DIAGNOSIS — Z91040 Latex allergy status: Secondary | ICD-10-CM

## 2015-05-08 DIAGNOSIS — I959 Hypotension, unspecified: Secondary | ICD-10-CM | POA: Diagnosis present

## 2015-05-08 DIAGNOSIS — E785 Hyperlipidemia, unspecified: Secondary | ICD-10-CM | POA: Diagnosis present

## 2015-05-08 DIAGNOSIS — E860 Dehydration: Secondary | ICD-10-CM | POA: Diagnosis present

## 2015-05-08 DIAGNOSIS — Z7901 Long term (current) use of anticoagulants: Secondary | ICD-10-CM

## 2015-05-08 DIAGNOSIS — Z91018 Allergy to other foods: Secondary | ICD-10-CM

## 2015-05-08 DIAGNOSIS — B9620 Unspecified Escherichia coli [E. coli] as the cause of diseases classified elsewhere: Secondary | ICD-10-CM | POA: Diagnosis present

## 2015-05-08 DIAGNOSIS — K219 Gastro-esophageal reflux disease without esophagitis: Secondary | ICD-10-CM | POA: Diagnosis present

## 2015-05-08 DIAGNOSIS — R001 Bradycardia, unspecified: Secondary | ICD-10-CM | POA: Diagnosis not present

## 2015-05-08 DIAGNOSIS — D509 Iron deficiency anemia, unspecified: Secondary | ICD-10-CM | POA: Diagnosis present

## 2015-05-08 DIAGNOSIS — N390 Urinary tract infection, site not specified: Secondary | ICD-10-CM | POA: Diagnosis present

## 2015-05-08 DIAGNOSIS — N171 Acute kidney failure with acute cortical necrosis: Secondary | ICD-10-CM | POA: Diagnosis not present

## 2015-05-08 DIAGNOSIS — I6529 Occlusion and stenosis of unspecified carotid artery: Secondary | ICD-10-CM | POA: Diagnosis present

## 2015-05-08 DIAGNOSIS — I340 Nonrheumatic mitral (valve) insufficiency: Secondary | ICD-10-CM | POA: Diagnosis present

## 2015-05-08 DIAGNOSIS — I480 Paroxysmal atrial fibrillation: Secondary | ICD-10-CM | POA: Diagnosis present

## 2015-05-08 DIAGNOSIS — Z66 Do not resuscitate: Secondary | ICD-10-CM | POA: Diagnosis present

## 2015-05-08 DIAGNOSIS — F419 Anxiety disorder, unspecified: Secondary | ICD-10-CM | POA: Diagnosis present

## 2015-05-08 DIAGNOSIS — Z87891 Personal history of nicotine dependence: Secondary | ICD-10-CM

## 2015-05-08 DIAGNOSIS — I10 Essential (primary) hypertension: Secondary | ICD-10-CM | POA: Diagnosis present

## 2015-05-08 DIAGNOSIS — Z888 Allergy status to other drugs, medicaments and biological substances status: Secondary | ICD-10-CM

## 2015-05-08 DIAGNOSIS — Z79891 Long term (current) use of opiate analgesic: Secondary | ICD-10-CM

## 2015-05-08 DIAGNOSIS — R55 Syncope and collapse: Secondary | ICD-10-CM | POA: Diagnosis present

## 2015-05-08 DIAGNOSIS — Z96651 Presence of right artificial knee joint: Secondary | ICD-10-CM | POA: Diagnosis present

## 2015-05-08 DIAGNOSIS — Z9049 Acquired absence of other specified parts of digestive tract: Secondary | ICD-10-CM | POA: Diagnosis present

## 2015-05-08 DIAGNOSIS — G629 Polyneuropathy, unspecified: Secondary | ICD-10-CM | POA: Diagnosis present

## 2015-05-08 DIAGNOSIS — I083 Combined rheumatic disorders of mitral, aortic and tricuspid valves: Secondary | ICD-10-CM | POA: Diagnosis present

## 2015-05-08 DIAGNOSIS — I2510 Atherosclerotic heart disease of native coronary artery without angina pectoris: Secondary | ICD-10-CM | POA: Diagnosis present

## 2015-05-08 DIAGNOSIS — Z885 Allergy status to narcotic agent status: Secondary | ICD-10-CM | POA: Diagnosis not present

## 2015-05-08 DIAGNOSIS — Z882 Allergy status to sulfonamides status: Secondary | ICD-10-CM | POA: Diagnosis not present

## 2015-05-08 DIAGNOSIS — E875 Hyperkalemia: Secondary | ICD-10-CM | POA: Diagnosis present

## 2015-05-08 DIAGNOSIS — R112 Nausea with vomiting, unspecified: Secondary | ICD-10-CM | POA: Diagnosis not present

## 2015-05-08 DIAGNOSIS — G8929 Other chronic pain: Secondary | ICD-10-CM | POA: Diagnosis present

## 2015-05-08 DIAGNOSIS — G9340 Encephalopathy, unspecified: Secondary | ICD-10-CM | POA: Diagnosis present

## 2015-05-08 DIAGNOSIS — R4182 Altered mental status, unspecified: Secondary | ICD-10-CM | POA: Diagnosis not present

## 2015-05-08 DIAGNOSIS — N179 Acute kidney failure, unspecified: Principal | ICD-10-CM

## 2015-05-08 DIAGNOSIS — R079 Chest pain, unspecified: Secondary | ICD-10-CM

## 2015-05-08 DIAGNOSIS — I509 Heart failure, unspecified: Secondary | ICD-10-CM | POA: Diagnosis not present

## 2015-05-08 DIAGNOSIS — K529 Noninfective gastroenteritis and colitis, unspecified: Secondary | ICD-10-CM | POA: Diagnosis present

## 2015-05-08 DIAGNOSIS — R579 Shock, unspecified: Secondary | ICD-10-CM | POA: Diagnosis not present

## 2015-05-08 DIAGNOSIS — Z90710 Acquired absence of both cervix and uterus: Secondary | ICD-10-CM | POA: Diagnosis not present

## 2015-05-08 DIAGNOSIS — Z95810 Presence of automatic (implantable) cardiac defibrillator: Secondary | ICD-10-CM | POA: Diagnosis not present

## 2015-05-08 DIAGNOSIS — N19 Unspecified kidney failure: Secondary | ICD-10-CM | POA: Diagnosis present

## 2015-05-08 LAB — CBC WITH DIFFERENTIAL/PLATELET
Basophils Absolute: 0 10*3/uL (ref 0.0–0.1)
Basophils Relative: 0 %
Eosinophils Absolute: 0.3 10*3/uL (ref 0.0–0.7)
Eosinophils Relative: 3 %
HCT: 34.3 % — ABNORMAL LOW (ref 36.0–46.0)
Hemoglobin: 11.2 g/dL — ABNORMAL LOW (ref 12.0–15.0)
Lymphocytes Relative: 18 %
Lymphs Abs: 1.5 10*3/uL (ref 0.7–4.0)
MCH: 29.9 pg (ref 26.0–34.0)
MCHC: 32.7 g/dL (ref 30.0–36.0)
MCV: 91.7 fL (ref 78.0–100.0)
Monocytes Absolute: 0.7 10*3/uL (ref 0.1–1.0)
Monocytes Relative: 8 %
Neutro Abs: 5.6 10*3/uL (ref 1.7–7.7)
Neutrophils Relative %: 71 %
Platelets: 211 10*3/uL (ref 150–400)
RBC: 3.74 MIL/uL — ABNORMAL LOW (ref 3.87–5.11)
RDW: 13.4 % (ref 11.5–15.5)
WBC: 8 10*3/uL (ref 4.0–10.5)

## 2015-05-08 LAB — BASIC METABOLIC PANEL
Anion gap: 9 (ref 5–15)
Anion gap: 13 (ref 5–15)
BUN: 46 mg/dL — ABNORMAL HIGH (ref 6–20)
BUN: 41 mg/dL — ABNORMAL HIGH (ref 6–20)
CO2: 21 mmol/L — ABNORMAL LOW (ref 22–32)
CO2: 20 mmol/L — ABNORMAL LOW (ref 22–32)
Calcium: 8.9 mg/dL (ref 8.9–10.3)
Calcium: 8.5 mg/dL — ABNORMAL LOW (ref 8.9–10.3)
Chloride: 107 mmol/L (ref 101–111)
Chloride: 104 mmol/L (ref 101–111)
Creatinine, Ser: 1.6 mg/dL — ABNORMAL HIGH (ref 0.44–1.00)
Creatinine, Ser: 1.56 mg/dL — ABNORMAL HIGH (ref 0.44–1.00)
GFR calc Af Amer: 32 mL/min — ABNORMAL LOW (ref >60)
GFR calc Af Amer: 33 mL/min — ABNORMAL LOW (ref >60)
GFR calc non Af Amer: 27 mL/min — ABNORMAL LOW (ref >60)
GFR calc non Af Amer: 28 mL/min — ABNORMAL LOW (ref >60)
Glucose, Bld: 117 mg/dL — ABNORMAL HIGH (ref 65–99)
Glucose, Bld: 158 mg/dL — ABNORMAL HIGH (ref 65–99)
Potassium: 5 mmol/L (ref 3.5–5.1)
Potassium: 5 mmol/L (ref 3.5–5.1)
Sodium: 137 mmol/L (ref 135–145)
Sodium: 137 mmol/L (ref 135–145)

## 2015-05-08 LAB — CBC
HCT: 36 % (ref 36.0–46.0)
Hemoglobin: 11.9 g/dL — ABNORMAL LOW (ref 12.0–15.0)
MCH: 30.4 pg (ref 26.0–34.0)
MCHC: 33.1 g/dL (ref 30.0–36.0)
MCV: 91.8 fL (ref 78.0–100.0)
Platelets: 223 10*3/uL (ref 150–400)
RBC: 3.92 MIL/uL (ref 3.87–5.11)
RDW: 13.6 % (ref 11.5–15.5)
WBC: 10.2 10*3/uL (ref 4.0–10.5)

## 2015-05-08 LAB — BRAIN NATRIURETIC PEPTIDE: B Natriuretic Peptide: 511.1 pg/mL — ABNORMAL HIGH (ref 0.0–100.0)

## 2015-05-08 LAB — I-STAT ARTERIAL BLOOD GAS, ED
Acid-Base Excess: 1 mmol/L (ref 0.0–2.0)
Bicarbonate: 23.9 mEq/L (ref 20.0–24.0)
O2 Saturation: 99 %
Patient temperature: 98.3
TCO2: 25 mmol/L (ref 0–100)
pCO2 arterial: 29.8 mmHg — ABNORMAL LOW (ref 35.0–45.0)
pH, Arterial: 7.512 — ABNORMAL HIGH (ref 7.350–7.450)
pO2, Arterial: 121 mmHg — ABNORMAL HIGH (ref 80.0–100.0)

## 2015-05-08 LAB — COMPREHENSIVE METABOLIC PANEL
ALT: 19 U/L (ref 14–54)
AST: 30 U/L (ref 15–41)
Albumin: 3.4 g/dL — ABNORMAL LOW (ref 3.5–5.0)
Alkaline Phosphatase: 27 U/L — ABNORMAL LOW (ref 38–126)
Anion gap: 7 (ref 5–15)
BUN: 49 mg/dL — ABNORMAL HIGH (ref 6–20)
CO2: 21 mmol/L — ABNORMAL LOW (ref 22–32)
Calcium: 8.6 mg/dL — ABNORMAL LOW (ref 8.9–10.3)
Chloride: 107 mmol/L (ref 101–111)
Creatinine, Ser: 1.91 mg/dL — ABNORMAL HIGH (ref 0.44–1.00)
GFR calc Af Amer: 26 mL/min — ABNORMAL LOW (ref >60)
GFR calc non Af Amer: 22 mL/min — ABNORMAL LOW (ref >60)
Glucose, Bld: 87 mg/dL (ref 65–99)
Potassium: 6.9 mmol/L (ref 3.5–5.1)
Sodium: 135 mmol/L (ref 135–145)
Total Bilirubin: 0.4 mg/dL (ref 0.3–1.2)
Total Protein: 6.1 g/dL — ABNORMAL LOW (ref 6.5–8.1)

## 2015-05-08 LAB — CBG MONITORING, ED
Glucose-Capillary: 231 mg/dL — ABNORMAL HIGH (ref 65–99)
Glucose-Capillary: 85 mg/dL (ref 65–99)
Glucose-Capillary: 98 mg/dL (ref 65–99)

## 2015-05-08 LAB — I-STAT CG4 LACTIC ACID, ED: Lactic Acid, Venous: 0.84 mmol/L (ref 0.5–2.0)

## 2015-05-08 LAB — PROTIME-INR
INR: 4.58 — ABNORMAL HIGH (ref 0.00–1.49)
Prothrombin Time: 42.1 seconds — ABNORMAL HIGH (ref 11.6–15.2)

## 2015-05-08 LAB — TROPONIN I
Troponin I: <0.03 ng/mL (ref <0.031)
Troponin I: <0.03 ng/mL (ref <0.031)
Troponin I: <0.03 ng/mL (ref <0.031)

## 2015-05-08 LAB — I-STAT TROPONIN, ED: Troponin i, poc: 0.01 ng/mL (ref 0.00–0.08)

## 2015-05-08 LAB — MRSA PCR SCREENING: MRSA by PCR: NEGATIVE

## 2015-05-08 LAB — POTASSIUM: Potassium: 5.6 mmol/L — ABNORMAL HIGH (ref 3.5–5.1)

## 2015-05-08 MED ORDER — ALBUTEROL SULFATE (2.5 MG/3ML) 0.083% IN NEBU
2.5000 mg | INHALATION_SOLUTION | Freq: Once | RESPIRATORY_TRACT | Status: AC
  Administered 2015-05-08: 2.5 mg via RESPIRATORY_TRACT
  Filled 2015-05-08: qty 3

## 2015-05-08 MED ORDER — DEXTROSE 5 % IV SOLN
1.0000 mg/h | INTRAVENOUS | Status: DC
  Administered 2015-05-08 – 2015-05-09 (×3): 1 mg/h via INTRAVENOUS
  Filled 2015-05-08 (×3): qty 5

## 2015-05-08 MED ORDER — ATROPINE SULFATE 1 MG/ML IJ SOLN
0.5000 mg | Freq: Once | INTRAMUSCULAR | Status: AC
  Administered 2015-05-08: 0.5 mg via INTRAVENOUS
  Filled 2015-05-08: qty 1

## 2015-05-08 MED ORDER — ATROPINE SULFATE 1 MG/ML IJ SOLN
1.0000 mg | INTRAMUSCULAR | Status: DC | PRN
  Administered 2015-05-08 (×3): 1 mg via INTRAVENOUS
  Filled 2015-05-08 (×2): qty 1

## 2015-05-08 MED ORDER — SODIUM CHLORIDE 0.9 % IV SOLN: 250.0000 mL | INTRAVENOUS | Status: DC | PRN

## 2015-05-08 MED ORDER — DOPAMINE-DEXTROSE 3.2-5 MG/ML-% IV SOLN
0.0000 ug/kg/min | INTRAVENOUS | Status: DC
  Administered 2015-05-08: 5 ug/kg/min via INTRAVENOUS
  Filled 2015-05-08: qty 250

## 2015-05-08 MED ORDER — SODIUM CHLORIDE 0.9 % IV BOLUS (SEPSIS)
500.0000 mL | Freq: Once | INTRAVENOUS | Status: AC
  Administered 2015-05-08: 500 mL via INTRAVENOUS

## 2015-05-08 MED ORDER — FUROSEMIDE 10 MG/ML IJ SOLN
40.0000 mg | Freq: Once | INTRAMUSCULAR | Status: AC
  Administered 2015-05-08: 40 mg via INTRAVENOUS
  Filled 2015-05-08: qty 4

## 2015-05-08 MED ORDER — SODIUM CHLORIDE 0.9 % IV SOLN
INTRAVENOUS | Status: DC
  Administered 2015-05-08: 23:00:00 via INTRAVENOUS

## 2015-05-08 MED ORDER — HEPARIN SODIUM (PORCINE) 5000 UNIT/ML IJ SOLN: 5000.0000 [IU] | Freq: Three times a day (TID) | INTRAMUSCULAR | Status: DC

## 2015-05-08 MED ORDER — ONDANSETRON HCL 4 MG/2ML IJ SOLN
4.0000 mg | Freq: Three times a day (TID) | INTRAMUSCULAR | Status: DC | PRN
  Administered 2015-05-11: 4 mg via INTRAVENOUS
  Filled 2015-05-08 (×2): qty 2

## 2015-05-08 MED ORDER — DEXTROSE 50 % IV SOLN
2.0000 | Freq: Once | INTRAVENOUS | Status: AC
Start: 2015-05-08 — End: 2015-05-08
  Administered 2015-05-08: 100 mL via INTRAVENOUS
  Filled 2015-05-08: qty 100

## 2015-05-08 MED ORDER — SODIUM BICARBONATE 8.4 % IV SOLN
50.0000 meq | Freq: Once | INTRAVENOUS | Status: AC
  Administered 2015-05-08: 50 meq via INTRAVENOUS
  Filled 2015-05-08: qty 50

## 2015-05-08 MED ORDER — PANTOPRAZOLE SODIUM 40 MG IV SOLR
40.0000 mg | Freq: Every day | INTRAVENOUS | Status: DC
  Administered 2015-05-08 – 2015-05-09 (×2): 40 mg via INTRAVENOUS
  Filled 2015-05-08 (×2): qty 40

## 2015-05-08 MED ORDER — INSULIN GLARGINE 100 UNIT/ML ~~LOC~~ SOLN
5.0000 [IU] | Freq: Once | SUBCUTANEOUS | Status: AC
  Administered 2015-05-08: 5 [IU] via SUBCUTANEOUS
  Filled 2015-05-08: qty 0.05

## 2015-05-08 MED ORDER — SODIUM POLYSTYRENE SULFONATE 15 GM/60ML PO SUSP: 30.0000 g | Freq: Once | ORAL | Status: DC

## 2015-05-08 MED ORDER — SODIUM CHLORIDE 0.9 % IV SOLN
1.0000 g | Freq: Once | INTRAVENOUS | Status: AC
  Administered 2015-05-08: 1 g via INTRAVENOUS
  Filled 2015-05-08: qty 10

## 2015-05-08 MED ORDER — SODIUM CHLORIDE 0.9 % IV BOLUS (SEPSIS)
1000.0000 mL | Freq: Once | INTRAVENOUS | Status: AC
  Administered 2015-05-08: 1000 mL via INTRAVENOUS

## 2015-05-08 MED ORDER — ATROPINE SULFATE 1 MG/ML IJ SOLN
1.0000 mg | Freq: Once | INTRAMUSCULAR | Status: DC
  Filled 2015-05-08: qty 1

## 2015-05-08 MED ORDER — SODIUM POLYSTYRENE SULFONATE 15 GM/60ML PO SUSP
15.0000 g | Freq: Once | ORAL | Status: DC
  Filled 2015-05-08: qty 60

## 2015-05-08 NOTE — ED Provider Notes (Signed)
CSN: [id number]     Arrival date & time 05/08/15  1345 History   First MD Initiated Contact with Patient 05/08/15 1352     Chief Complaint  Patient presents with  . Bradycardia  . Hypotension  . Near Syncope  . Shortness of Breath     (Consider location/radiation/quality/duration/timing/severity/associated sxs/prior Treatment) Patient is a 81 y.o. female presenting with weakness.  Weakness This is a new problem. The current episode started 1 to 2 hours ago. The problem occurs constantly. The problem has been gradually worsening. Pertinent negatives include no chest pain and no shortness of breath. Nothing aggravates the symptoms. Nothing relieves the symptoms. She has tried nothing for the symptoms.    Past Medical History  Diagnosis Date  . Hypertension   . Hyperlipidemia   . GERD (gastroesophageal reflux disease)   . Anxiety   . Mitral prolapse   . Degenerative arthritis   . Neuropathy   . Peripheral neuropathy     Small fiber   . Diverticulosis   . Mild renal insufficiency   . Hx of echocardiogram 07/30/2012    EF 55-60%; mild LVH; mild AV stenosis & trivial regurg.; mod MV regurg.; LA severly dilated; RV systolic pressure increased consistent with severe pulm. hypertension; RA moderately dilated; mod TV regurg.;   . History of nuclear stress test 09/19/2007    normal pattern of perfusion; post-stress EF 86%; EKG negative for ischemia; low risk scan  . Carotid arterial disease 07/30/2012    carotid doppler; normal study  . Foot fracture, left   . Memory disorder 03/05/2014  . Chronic anticoagulation, with coumadin, with PAF and CHADS2Vasc2 score of 4 09/16/2014  . S/P cardiac cath 09/14/14 mild calcification of ostial RCA but otherwise normal coronary arteries 09/14/2014  . Anemia, iron deficiency 09/16/2014   Past Surgical History  Procedure Laterality Date  . Abdominal hysterectomy    . Replacement total knee Right   . Tear duct probing with strabismus repair      Tear  duct repair surgery  . Gallbladder resection    . Tonsillectomy    . Appendectomy    . Resuspension procedure    .  bilateral bunionectomies    . Foot fracture surgery Left   . Dilation and curettage of uterus    . Umbilical hernia repair    . Left heart catheterization with coronary angiogram N/A 09/14/2014    Procedure: LEFT HEART CATHETERIZATION WITH CORONARY ANGIOGRAM;  Surgeon: Troy Sine, MD;  Location: [facility] CATH LAB;  Service: Cardiovascular;  Laterality: N/A;   Family History  Problem Relation Age of Onset  . Pneumonia Mother   . Coronary artery disease Mother   . Heart disease Father   . Cancer Father   . Arthritis Sister   . Hypertension Mother   . Hypertension Father    Social History  Substance Use Topics  . Smoking status: Former Research scientist (life sciences)  . Smokeless tobacco: Never Used  . Alcohol Use: No   OB History    No data available     Review of Systems  Respiratory: Negative for shortness of breath.   Cardiovascular: Negative for chest pain.  Neurological: Positive for weakness.      Allergies  Avelox; Aricept; Codeine; Garlic; Latex; Onion; Polysporin; and Sulfa drugs cross reactors  Home Medications   Prior to Admission medications   Medication Sig Start Date End Date Taking? Authorizing Provider  atorvastatin (LIPITOR) 10 MG tablet Take 1 tablet (10 mg total) by mouth daily.  10/01/14   Brett Canales, PA-C  benazepril (LOTENSIN) 40 MG tablet Take 1 tablet (40 mg total) by mouth daily. 03/06/13   Troy Sine, MD  Biotin 1 MG CAPS Take 1 capsule every day 09/22/14   Troy Sine, MD  Calcium Carbonate-Vitamin D (CALTRATE 600+D) 600-400 MG-UNIT per tablet Take 1 tablet by mouth daily.     Historical Provider, MD  carvedilol (COREG) 3.125 MG tablet Take 1 tablet (3.125 mg total) by mouth 2 (two) times daily. 12/06/14   Troy Sine, MD  carvedilol (COREG) 6.25 MG tablet  11/30/14   Historical Provider, MD  cetirizine (ZYRTEC) 10 MG tablet Take 10 mg by mouth  daily.    Historical Provider, MD  cholecalciferol (VITAMIN D) 1000 UNITS tablet Take 1,000 Units by mouth daily.      Historical Provider, MD  colestipol (COLESTID) 1 G tablet Take 1 g by mouth 2 (two) times daily as needed. For diarrhea     Historical Provider, MD  cycloSPORINE (RESTASIS) 0.05 % ophthalmic emulsion Place 1 drop into both eyes daily.      Historical Provider, MD  diltiazem (CARDIZEM SR) 90 MG 12 hr capsule  12/27/14   Historical Provider, MD  diltiazem (TIAZAC) 180 MG 24 hr capsule Take 1 capsule (180 mg total) by mouth daily. 01/14/15   Troy Sine, MD  diphenhydrAMINE (BENADRYL) 25 mg capsule Take 25 mg by mouth at bedtime.      Historical Provider, MD  donepezil (ARICEPT) 5 MG tablet  03/03/15   Historical Provider, MD  fenofibrate micronized (LOFIBRA) 134 MG capsule Take 1 capsule (134 mg total) by mouth daily. 03/23/15   Troy Sine, MD  Ferrous Sulfate (IRON) 28 MG TABS Take 1 tablet (28 mg total) by mouth 2 (two) times daily. 09/16/14   Isaiah Serge, NP  Flaxseed, Linseed, (FLAXSEED OIL) 1000 MG CAPS Take 3 capsules by mouth daily. 2 @ lunch and 1 @ dinner    Historical Provider, MD  flunisolide (NASALIDE) 0.025 % SOLN Inhale 1 spray into the lungs daily.      Historical Provider, MD  gabapentin (NEURONTIN) 300 MG capsule TAKE 1 CAPSULE THREE TIMES DAILY AND 3 CAPSULES AT BEDTIME 03/22/15   Kathrynn Ducking, MD  hydrochlorothiazide (HYDRODIURIL) 25 MG tablet Take 1 tablet (25 mg total) by mouth daily. 01/25/15   Troy Sine, MD  HYDROcodone-acetaminophen (NORCO/VICODIN) 5-325 MG per tablet Take 1 tablet by mouth every 6 (six) hours.  05/06/13   Historical Provider, MD  hydroxypropyl methylcellulose (ISOPTO TEARS) 2.5 % ophthalmic solution Place 1 drop into both eyes as needed for dry eyes.    Historical Provider, MD  Lactobacillus (ACIDOPHILUS) 100 MG CAPS Take 100 mg by mouth daily.     Historical Provider, MD  lansoprazole (PREVACID) 15 MG capsule Take 15 mg by mouth daily.       Historical Provider, MD  Multiple Vitamin (MULTIVITAMIN) tablet Take 1 tablet by mouth daily.      Historical Provider, MD  Multiple Vitamins-Minerals (OCUVITE PRESERVISION PO) Take 1 capsule by mouth daily.    Historical Provider, MD  pyridOXINE (VITAMIN B-6) 100 MG tablet Take 100 mg by mouth daily.    Historical Provider, MD  simethicone (MYLICON) 789 MG chewable tablet Chew 125 mg by mouth as needed for flatulence.    Historical Provider, MD  vitamin B-12 (CYANOCOBALAMIN) 1000 MCG tablet Take 1,000 mcg by mouth daily.    Historical Provider, MD  warfarin (  COUMADIN) 2 MG tablet Take 1.5 to 2 tablets by mouth daily as directed by coumadin clinic 02/11/15   Troy Sine, MD   BP 116/41 mmHg  Pulse 43  Temp(Src) 98.3 F (36.8 C) (Oral)  Resp 20  Ht 5\' 3"  (1.6 m)  Wt 125 lb (56.7 kg)  BMI 22.15 kg/m2  SpO2 97% Physical Exam  Constitutional: She is oriented to person, place, and time. She appears well-developed and well-nourished.  HENT:  Head: Normocephalic and atraumatic.  Eyes: Pupils are equal, round, and reactive to light.  Cardiovascular: Regular rhythm.  Bradycardia present.   Pulmonary/Chest: Effort normal. No respiratory distress. She has rales.  Abdominal: Soft. She exhibits no distension. There is no tenderness.  Musculoskeletal: She exhibits edema.  Neurological: She is alert and oriented to person, place, and time.  Skin: Skin is warm and dry.    ED Course  Procedures (including critical care time)  CRITICAL CARE Performed by: Merrily Pew   Total critical care time: 65 minutes  Critical care time was exclusive of separately billable procedures and treating other patients.  Critical care was necessary to treat or prevent imminent or life-threatening deterioration.  Critical care was time spent personally by me on the following activities: development of treatment plan with patient and/or surrogate as well as nursing, discussions with consultants,  evaluation of patient's response to treatment, examination of patient, obtaining history from patient or surrogate, ordering and performing treatments and interventions, ordering and review of laboratory studies, ordering and review of radiographic studies, pulse oximetry and re-evaluation of patient's condition.   Labs Review Labs Reviewed  CBC WITH DIFFERENTIAL/PLATELET - Abnormal; Notable for the following:    RBC 3.74 (*)    Hemoglobin 11.2 (*)    HCT 34.3 (*)    All other components within normal limits  COMPREHENSIVE METABOLIC PANEL  TROPONIN I  BRAIN NATRIURETIC PEPTIDE  I-STAT TROPOININ, ED    Imaging Review Dg Chest Portable 1 View  05/08/2015   CLINICAL DATA:  81 year old female with a history of pulmonary edema. History arterial disease and cardiac disease.  EXAM: PORTABLE CHEST - 1 VIEW  COMPARISON:  None.  FINDINGS: Cardiomediastinal silhouette unchanged in size and contour. Mediastinum partially obscured by overlying defibrillator pads.  Calcifications aortic arch.  No evidence of pulmonary vascular congestion.  Low lung volumes.  No confluent airspace disease, pneumothorax, or pleural effusion. Persistent interstitial opacities.  IMPRESSION: No radiographic evidence of acute cardiopulmonary disease.  Defibrillator pads.  Atherosclerosis.  Signed,  Dulcy Fanny. Earleen Newport, DO  [facility]  [facility]   Electronically Signed   By: Corrie Mckusick D.O.   On: 05/08/2015 14:19   I have personally reviewed and evaluated these images and lab results as part of my medical decision-making.   EKG Interpretation   Date/Time:  Sunday May 08 2015 13:49:35 EDT Ventricular Rate:  43 PR Interval:    QRS Duration: 101 QT Interval:  423 QTC Calculation: 358 R Axis:   -36 Text Interpretation:  Junctional rhythm Left axis deviation Low voltage,  precordial leads Confirmed by MESNER MD, Corene Cornea [phone]) on 05/08/2015  1:53:02 PM      MDM    Final diagnoses:  Bradycardia  Hyperkalemia  Acute pulmonary edema  Shock   81 yo F came in with symptomatic bradycardia, hypotension, early pulmonary edema. Initially ddx large but of most importance: Hyperkalemia, ACS, elevated ICP, beta blocker overdose. Labs and imaging ordered to evaluate for all of these. Started  having lower BP's and lower HR's but was able to be temporarily improved with multiple doses of atropine. Considered starting dopamine drip however since she responded well to atropine every 20-30 minutes we held off. Cardiology consulted early incase patient decompensated and needed a pacemaker. Patient initially with normal MS, however progressively declined to the point where she was responsive to painful stimuli only. Still seemed to be protecting airway, no deoxygenation. BMP returned with elevated potassium and ARF as likely causes for her symptoms. Gave calcium, insulin/d50, lasix, bicarb. D/W critical care who suggested also starting glucagon infusion in case the beta blocker was contributing as well. Her MS continued to decline but not to the point of needing intubation. I felt that if I could improve her hemodynamics her MS would improve and hopefully stave off invasive airway. Patietn care assumed by Dr. Johnney Killian awaiting CCM consult.      Merrily Pew, MD 05/10/15 [phone]

## 2015-05-08 NOTE — ED Notes (Signed)
Per EMS: patient was getting ready to go on a trip with family and got in the elevator and felt like she was going to pass out.  Patient was assisted to a chair by family.  EMS called.  Bradycardic junctional rhythm 42, 43 bmp on 12 lead with EMS, no depression or elevation noted.  Pedal edema noted, thought to be new.  Hypotensive, 86/56 with ems.  Given 500 cc nacl pta.  No known chf hx.  On betablockers.

## 2015-05-08 NOTE — H&P (Signed)
PULMONARY / CRITICAL CARE MEDICINE   Name: [name] MRN: [id number] DOB: 1922/04/03    ADMISSION DATE:  05/08/2015  REFERRING MD :  [name] ED  CHIEF COMPLAINT:  Weakness, near syncope  INITIAL PRESENTATION: 81 year old woman with hypertension, coronary disease, atrial fibrillation admitted with weakness and found to have symptomatic bradycardia in the setting of acute renal failure and hyperkalemia.   STUDIES:  CXR 9/18 >> no infiltrates, effusion. Aortic calcification present TTE 11/16/13 >> Normal LV function, 60-65%, moderate MR, TR with pulmonary hypertension with an estimated PASP of 61 mmHg (? Whether this is overestimated in setting TR) Left and right heart cath 08/2014 >> No significant CAD, borderline mild pulmonary hypertension  SIGNIFICANT EVENTS: Admitted 9/18  HISTORY OF PRESENT ILLNESS:  81 yo woman, hx of CAD, mild AS, moderate MR, HTN, A Fib on warfarin. She is obtunded on my arrival and the history is taken from her son at bedside. He reports that she developed weakness beginning 2 days ago, experienced an episode of near syncope. She presented to the emergency department 9/18 and was found to have a bradycardic junctional rhythm in the low 40s with associated hypotension. She had a syncopal episode in the ED. Lab work revealed new renal insufficiency and associated hyperkalemia with a K 6.9. She is on carvedilol, diltiazem, benazepril and has taken these medications reliably. She has had some increasing lower extremity edema and states that she has used salt and salt substitute (KCl). Also has had a significant stress in her life due to the death of a family member. She was given IV fluids, insulin and dextrose and calcium in the Emergency department. Followed by glucagon and some pushes of atropine. Cardiology has seen her, no pacer planned at this time. Despite these interventions her MS has worsened - on my arrival she is obtunded. I have discussed her living will and  code status with her son at bedside who confirms that she desires to be DNR, no resuscitation, but that she would want all medical therapy that could reverse this situation. We will admit her to ICU for monitoring and support.    PAST MEDICAL HISTORY :   has a past medical history of Hypertension; Hyperlipidemia; GERD (gastroesophageal reflux disease); Anxiety; Mitral prolapse; Degenerative arthritis; Neuropathy; Peripheral neuropathy; Diverticulosis; Mild renal insufficiency; echocardiogram (07/30/2012); History of nuclear stress test (09/19/2007); Carotid arterial disease (07/30/2012); Foot fracture, left; Memory disorder (03/05/2014); Chronic anticoagulation, with coumadin, with PAF and CHADS2Vasc2 score of 4 (09/16/2014); S/P cardiac cath 09/14/14 mild calcification of ostial RCA but otherwise normal coronary arteries (09/14/2014); and Anemia, iron deficiency (09/16/2014).  has past surgical history that includes Abdominal hysterectomy; Replacement total knee (Right); Tear duct probing with strabismus repair; gallbladder resection; Tonsillectomy; Appendectomy; resuspension procedure;  Bilateral bunionectomies; Foot fracture surgery (Left); Dilation and curettage of uterus; Umbilical hernia repair; and left heart catheterization with coronary angiogram (N/A, 09/14/2014). Prior to Admission medications   Medication Sig Start Date End Date Taking? Authorizing Provider  atorvastatin (LIPITOR) 10 MG tablet Take 1 tablet (10 mg total) by mouth daily. 10/01/14  Yes Brett Canales, PA-C  benazepril (LOTENSIN) 40 MG tablet Take 1 tablet (40 mg total) by mouth daily. 03/06/13  Yes Troy Sine, MD  Biotin 1 MG CAPS Take 1 capsule every day 09/22/14  Yes Troy Sine, MD  Calcium Carbonate-Vitamin D (CALTRATE 600+D) 600-400 MG-UNIT per tablet Take 1 tablet by mouth daily.    Yes Historical Provider, MD  carvedilol (COREG) 3.125 MG  tablet Take 1 tablet (3.125 mg total) by mouth 2 (two) times daily. 12/06/14  Yes Troy Sine, MD  cetirizine (ZYRTEC) 10 MG tablet Take 10 mg by mouth daily.   Yes Historical Provider, MD  cholecalciferol (VITAMIN D) 1000 UNITS tablet Take 1,000 Units by mouth daily.     Yes Historical Provider, MD  colestipol (COLESTID) 1 G tablet Take 1 g by mouth 2 (two) times daily as needed. For diarrhea    Yes Historical Provider, MD  cycloSPORINE (RESTASIS) 0.05 % ophthalmic emulsion Place 1 drop into both eyes daily.     Yes Historical Provider, MD  diltiazem (CARDIZEM SR) 90 MG 12 hr capsule Take 90 mg by mouth every 12 (twelve) hours.  12/27/14  Yes Historical Provider, MD  diphenhydrAMINE (BENADRYL) 25 mg capsule Take 25 mg by mouth at bedtime.     Yes Historical Provider, MD  donepezil (ARICEPT) 5 MG tablet Take 5 mg by mouth at bedtime.  03/03/15  Yes Historical Provider, MD  fenofibrate micronized (LOFIBRA) 134 MG capsule Take 1 capsule (134 mg total) by mouth daily. 03/23/15  Yes Troy Sine, MD  Ferrous Sulfate (IRON) 28 MG TABS Take 1 tablet (28 mg total) by mouth 2 (two) times daily. 09/16/14  Yes Isaiah Serge, NP  Flaxseed, Linseed, (FLAXSEED OIL) 1000 MG CAPS Take 1,000-2,000 mg by mouth 2 (two) times daily. 2 @ lunch and 1 @ dinner   Yes Historical Provider, MD  flunisolide (NASALIDE) 0.025 % SOLN Inhale 1 spray into the lungs daily.     Yes Historical Provider, MD  gabapentin (NEURONTIN) 300 MG capsule TAKE 1 CAPSULE THREE TIMES DAILY AND 3 CAPSULES AT BEDTIME Patient taking differently: TAKE 1 CAPSULE(300MG ) THREE TIMES DAILY AND 3 CAPSULES (900 MG) AT BEDTIME 03/22/15  Yes Kathrynn Ducking, MD  hydrochlorothiazide (HYDRODIURIL) 25 MG tablet Take 1 tablet (25 mg total) by mouth daily. 01/25/15  Yes Troy Sine, MD  HYDROcodone-acetaminophen (NORCO/VICODIN) 5-325 MG per tablet Take 1 tablet by mouth every 6 (six) hours.  05/06/13  Yes Historical Provider, MD  hydroxypropyl methylcellulose (ISOPTO TEARS) 2.5 % ophthalmic solution Place 1 drop into both eyes as needed for dry eyes.    Yes Historical Provider, MD  Lactobacillus (ACIDOPHILUS) 100 MG CAPS Take 100 mg by mouth daily.    Yes Historical Provider, MD  lansoprazole (PREVACID) 15 MG capsule Take 15 mg by mouth daily.     Yes Historical Provider, MD  Multiple Vitamin (MULTIVITAMIN) tablet Take 1 tablet by mouth daily.     Yes Historical Provider, MD  Multiple Vitamins-Minerals (VISION FORMULA/LUTEIN) TABS Take 1 tablet by mouth daily.   Yes Historical Provider, MD  pyridOXINE (VITAMIN B-6) 100 MG tablet Take 100 mg by mouth daily.   Yes Historical Provider, MD  simethicone (MYLICON) 161 MG chewable tablet Chew 125 mg by mouth as needed for flatulence.   Yes Historical Provider, MD  vitamin B-12 (CYANOCOBALAMIN) 1000 MCG tablet Take 1,000 mcg by mouth daily.   Yes Historical Provider, MD  warfarin (COUMADIN) 2 MG tablet Take 1.5 to 2 tablets by mouth daily as directed by coumadin clinic Patient taking differently: 3-4 mg daily at 6 PM. Takes 3mg  on Sun, Tues, Thurs, and Sat Takes 4mg  on Mon, Wed and Fri 02/11/15  Yes Troy Sine, MD   Allergies  Allergen Reactions  . Avelox [Moxifloxacin Hcl In Nacl] Shortness Of Breath and Swelling  . Aricept [Donepezil Hcl] Diarrhea  . Codeine Swelling       .  Garlic Other (See Comments)    Gi problems  . Latex Hives, Itching, Rash and Other (See Comments)    Watery blisters   . Onion Other (See Comments)    Gi problems  . Sulfa Drugs Cross Reactors Swelling  . Polysporin [Bacitracin-Polymyxin B] Other (See Comments)    FAMILY HISTORY:  indicated that her mother is deceased. She indicated that her father is deceased. She indicated that her sister is alive. She indicated that her maternal grandmother is deceased. She indicated that her paternal grandmother is deceased.  SOCIAL HISTORY:  reports that she has quit smoking. She has never used smokeless tobacco. She reports that she does not drink alcohol or use illicit drugs.  REVIEW OF SYSTEMS:  Unable to  obtain  SUBJECTIVE:  Pt is not able to give a hx  VITAL SIGNS: Temp:  [98.3 F (36.8 C)] 98.3 F (36.8 C) (09/18 1352) Pulse Rate:  [29-115] 115 (09/18 1630) Resp:  [14-28] 27 (09/18 1630) BP: (81-128)/(38-55) 128/49 mmHg (09/18 1630) SpO2:  [88 %-100 %] 92 % (09/18 1630) Weight:  [56.7 kg (125 lb)] 56.7 kg (125 lb) (09/18 1352) HEMODYNAMICS:   VENTILATOR SETTINGS:   INTAKE / OUTPUT:  Intake/Output Summary (Last 24 hours) at 05/08/15 1736 Last data filed at 05/08/15 1437  Gross per 24 hour  Intake    500 ml  Output      0 ml  Net    500 ml    PHYSICAL EXAMINATION: General:  Elderly ill appearing woman Neuro:  Snoring, obtunded, not responding to voice, minimally to pain HEENT:  OP very dry, PERRL Cardiovascular:  Brady in 40's, regular Lungs:  Coarse bilaterally Abdomen:  Soft, NT, + BS Musculoskeletal:  Trace B LE edema Skin:  No rash  LABS:  CBC  Recent Labs Lab 05/08/15 1445  WBC 8.0  HGB 11.2*  HCT 34.3*  PLT 211   Coag's  Recent Labs Lab 05/05/15  INR 2.0*   BMET  Recent Labs Lab 05/08/15 1445  NA 135  K 6.9*  CL 107  CO2 21*  BUN 49*  CREATININE 1.91*  GLUCOSE 87   Electrolytes  Recent Labs Lab 05/08/15 1445  CALCIUM 8.6*   Sepsis Markers No results for input(s): LATICACIDVEN, PROCALCITON, O2SATVEN in the last 168 hours. ABG No results for input(s): PHART, PCO2ART, PO2ART in the last 168 hours. Liver Enzymes  Recent Labs Lab 05/08/15 1445  AST 30  ALT 19  ALKPHOS 27*  BILITOT 0.4  ALBUMIN 3.4*   Cardiac Enzymes  Recent Labs Lab 05/08/15 1445  TROPONINI <0.03   Glucose  Recent Labs Lab 05/08/15 1654  GLUCAP 231*    Imaging Dg Chest Portable 1 View  05/08/2015   CLINICAL DATA:  81 year old female with a history of pulmonary edema. History arterial disease and cardiac disease.  EXAM: PORTABLE CHEST - 1 VIEW  COMPARISON:  None.  FINDINGS: Cardiomediastinal silhouette unchanged in size and contour.  Mediastinum partially obscured by overlying defibrillator pads.  Calcifications aortic arch.  No evidence of pulmonary vascular congestion.  Low lung volumes.  No confluent airspace disease, pneumothorax, or pleural effusion. Persistent interstitial opacities.  IMPRESSION: No radiographic evidence of acute cardiopulmonary disease.  Defibrillator pads.  Atherosclerosis.  Signed,  Dulcy Fanny. Earleen Newport, DO  [facility]  [facility]   Electronically Signed   By: Corrie Mckusick D.O.   On: 05/08/2015 14:19     ASSESSMENT / PLAN: CARDIOVASCULAR A:  Severe bradycardia, junctional rhythm,  presumed due to hyperkalemia and meds in setting renal failure.  CAD A fib on coumadin HTN MV regurgitation P:  - d/c coreg, dilt, ACE-i - IVF bolus again now - has received Ca, glucose / insulin - receiving glucagon now - would need to place panda to give kayexalate, will attempt this  - favor starting low dose dopamine via PIV now; consider PICC placement later - recheck BMP now - would consider pacer; will need to discuss with pt's son regarding whether this is acceptable given her goals of care  RENAL A:  Acute renal failure, suspect hypovolemia + ACE-i Hyperkalemia  P:   - IVF and K treatment as above - repeat labs - hold ACE-i, may need to d/c it altogether  PULMONARY A: Inability to protect airway due to altered MS P:   - will not intubate, clarified this with her son - supplemental O2 - pulm hygiene, suction as able  GASTROINTESTINAL A:  SUP Enteral access P:   - PPI - will likely need Panda tube to allow meds  HEMATOLOGIC A:  Mild anemia Anticoag on coumadin P:  - follow CBC and INR  INFECTIOUS A:  No evidence acute infection P:   - follow off abx at this time.   ENDOCRINE A:  No acute issues    P:    NEUROLOGIC A:  Obtundation, acute encephalopathy, suspect toxic - metabolic  P:   - Supportive care - Avoid any sedating meds.     FAMILY  - Updates: discussed her status and her care with son Zeanna Sunde at bedside. He understands that she is critically ill but that her issues may be reversible with non-invasive medical therapies. He confirms that she has a Living Will, that she would not want CPR, ACLS or mechanical ventilation.   - Inter-disciplinary family meet or Palliative Care meeting due by:  05/15/15   90 minutes CC time     Baltazar Apo, MD, PhD 05/08/2015, 6:42 PM [facility] [phone] or if no answer [phone]

## 2015-05-08 NOTE — ED Notes (Addendum)
6.9 no hemolysis. Dr Dayna Barker notifed

## 2015-05-08 NOTE — ED Notes (Signed)
CBG 98  

## 2015-05-08 NOTE — ED Provider Notes (Addendum)
I assumed care for Dr. Dayna Barker at approximately 17:20. The patient's case had already been consulted with the critical care team and cardiology for admission. Critical care had not yet evaluated the patient in the emergency department. Workup had already been initiated and patient had been undergoing treatment for bradycardia and hypotension. Reportedly the patient's mental status had been declining from her baseline and relative to her initial presentation. She had reportedly been alert and appropriate and then started to develop waxing and waning level of alertness. At the time that I assumed care for the patient her mental status had become very somnolent and having eye opening to tactile stimulus. At that time I did add orders for a CT head with the patient being anticoagulated on Coumadin, repeat CBG and potassium and ABG. I also added an albuterol treatment to be administered to help with hyperkalemia. Dr. Tobin Chad arrived at the patient's bedside just prior to her leaving for CT head. After his initial evaluation of the patient she was then taken to CT and he conducted his interview with the patient's son. The CT head is negative for acute bleed, the potassium has declined and the Accu-Chek was 86. Critical care has made arrangements for the patient's admission.  Charlesetta Shanks, MD 05/08/15 Einar Crow  Charlesetta Shanks, MD 05/08/15 [phone]

## 2015-05-08 NOTE — Consult Note (Signed)
Cardiology Consultation   Patient ID: [name]; [id number]; July 06, 1922   Admit date: 05/08/2015 Date of Consult: 05/08/2015  Primary Physician: Horatio Pel, MD Cardiologist: Dr. Shelva Majestic  Electrophysiologist:  n/a  Reason for Consultation: Bradycardia  History of Present Illness: [name] is a 81 y.o. female with a hx of valvular heart disease, HTN, HL, pulmonary hypertension. She was admitted in 08/2014 with a non-STEMI. Cardiac catheterization demonstrated no significant CAD. RHC demonstrated borderline/mild pulmonary hypertension. She did develop PAF with RVR and it was felt that her non-STEMI was related to demand ischemia. She was placed on Coumadin for anticoagulation at that time. Last seen by Dr. Claiborne Billings 4/16.  Patient presented to the emergency room today with complaints of weakness. She was noted to have significant bradycardia with junctional rhythm and hypotension with blood pressures in the 54Y systolic. She denies syncope but does admit to near syncope. She's been more short of breath over the past couple of days. She had a recent death in the family and has noted increased emotional stress. She's had exertional chest discomfort since that time. She denies orthopnea or PND. She does note increasing LE edema. She lives at [facility]. She does admit to being liberal with salt. She has also been using a lot of salt substitute (KCL).  Her K+ is 6.9 and her creatinine is 1.91 (baseline 0.75).  She is currently being given Insulin, Dextrose, Ca Gluconate. Her BP is now 70-623 systolic after IVF resuscitation.  Cardiology is asked to evaluate.   Cardiac Studies: LHC 1/16 LM: Normal LAD: Normal with mild region of intramyocardial bridging RI: Normal LCx: Normal RCA: Ostial calcification but free of significant disease EF 70% with hyperdynamic LV function, MAC with 3+ MR  Echo 3/15 EF 60-65%, normal wall motion, MAC, moderate MR, moderate LAE,  mild RAE, severe TR, PASP 61 mmHg     Past Medical History  Diagnosis Date  . Hypertension   . Hyperlipidemia   . GERD (gastroesophageal reflux disease)   . Anxiety   . Mitral prolapse   . Degenerative arthritis   . Neuropathy   . Peripheral neuropathy     Small fiber   . Diverticulosis   . Mild renal insufficiency   . Hx of echocardiogram 07/30/2012    EF 55-60%; mild LVH; mild AV stenosis & trivial regurg.; mod MV regurg.; LA severly dilated; RV systolic pressure increased consistent with severe pulm. hypertension; RA moderately dilated; mod TV regurg.;   . History of nuclear stress test 09/19/2007    normal pattern of perfusion; post-stress EF 86%; EKG negative for ischemia; low risk scan  . Carotid arterial disease 07/30/2012    carotid doppler; normal study  . Foot fracture, left   . Memory disorder 03/05/2014  . Chronic anticoagulation, with coumadin, with PAF and CHADS2Vasc2 score of 4 09/16/2014  . S/P cardiac cath 09/14/14 mild calcification of ostial RCA but otherwise normal coronary arteries 09/14/2014  . Anemia, iron deficiency 09/16/2014    Past Surgical History  Procedure Laterality Date  . Abdominal hysterectomy    . Replacement total knee Right   . Tear duct probing with strabismus repair      Tear duct repair surgery  . Gallbladder resection    . Tonsillectomy    . Appendectomy    . Resuspension procedure    .  bilateral bunionectomies    . Foot fracture surgery Left   . Dilation and curettage of uterus    .  Umbilical hernia repair    . Left heart catheterization with coronary angiogram N/A 09/14/2014    Procedure: LEFT HEART CATHETERIZATION WITH CORONARY ANGIOGRAM;  Surgeon: Troy Sine, MD;  Location: [facility] CATH LAB;  Service: Cardiovascular;  Laterality: N/A;      Home Meds: Prior to Admission medications   Medication Sig Start Date End Date Taking? Authorizing Provider  atorvastatin (LIPITOR) 10 MG tablet Take 1 tablet (10 mg total) by mouth daily.  10/01/14   Brett Canales, PA-C  benazepril (LOTENSIN) 40 MG tablet Take 1 tablet (40 mg total) by mouth daily. 03/06/13   Troy Sine, MD  Biotin 1 MG CAPS Take 1 capsule every day 09/22/14   Troy Sine, MD  Calcium Carbonate-Vitamin D (CALTRATE 600+D) 600-400 MG-UNIT per tablet Take 1 tablet by mouth daily.     Historical Provider, MD  carvedilol (COREG) 3.125 MG tablet Take 1 tablet (3.125 mg total) by mouth 2 (two) times daily. 12/06/14   Troy Sine, MD  carvedilol (COREG) 6.25 MG tablet  11/30/14   Historical Provider, MD  cetirizine (ZYRTEC) 10 MG tablet Take 10 mg by mouth daily.    Historical Provider, MD  cholecalciferol (VITAMIN D) 1000 UNITS tablet Take 1,000 Units by mouth daily.      Historical Provider, MD  colestipol (COLESTID) 1 G tablet Take 1 g by mouth 2 (two) times daily as needed. For diarrhea     Historical Provider, MD  cycloSPORINE (RESTASIS) 0.05 % ophthalmic emulsion Place 1 drop into both eyes daily.      Historical Provider, MD  diltiazem (CARDIZEM SR) 90 MG 12 hr capsule  12/27/14   Historical Provider, MD  diltiazem (TIAZAC) 180 MG 24 hr capsule Take 1 capsule (180 mg total) by mouth daily. 01/14/15   Troy Sine, MD  diphenhydrAMINE (BENADRYL) 25 mg capsule Take 25 mg by mouth at bedtime.      Historical Provider, MD  donepezil (ARICEPT) 5 MG tablet  03/03/15   Historical Provider, MD  fenofibrate micronized (LOFIBRA) 134 MG capsule Take 1 capsule (134 mg total) by mouth daily. 03/23/15   Troy Sine, MD  Ferrous Sulfate (IRON) 28 MG TABS Take 1 tablet (28 mg total) by mouth 2 (two) times daily. 09/16/14   Isaiah Serge, NP  Flaxseed, Linseed, (FLAXSEED OIL) 1000 MG CAPS Take 3 capsules by mouth daily. 2 @ lunch and 1 @ dinner    Historical Provider, MD  flunisolide (NASALIDE) 0.025 % SOLN Inhale 1 spray into the lungs daily.      Historical Provider, MD  gabapentin (NEURONTIN) 300 MG capsule TAKE 1 CAPSULE THREE TIMES DAILY AND 3 CAPSULES AT BEDTIME 03/22/15    Kathrynn Ducking, MD  hydrochlorothiazide (HYDRODIURIL) 25 MG tablet Take 1 tablet (25 mg total) by mouth daily. 01/25/15   Troy Sine, MD  HYDROcodone-acetaminophen (NORCO/VICODIN) 5-325 MG per tablet Take 1 tablet by mouth every 6 (six) hours.  05/06/13   Historical Provider, MD  hydroxypropyl methylcellulose (ISOPTO TEARS) 2.5 % ophthalmic solution Place 1 drop into both eyes as needed for dry eyes.    Historical Provider, MD  Lactobacillus (ACIDOPHILUS) 100 MG CAPS Take 100 mg by mouth daily.     Historical Provider, MD  lansoprazole (PREVACID) 15 MG capsule Take 15 mg by mouth daily.      Historical Provider, MD  Multiple Vitamin (MULTIVITAMIN) tablet Take 1 tablet by mouth daily.      Historical Provider, MD  Multiple Vitamins-Minerals (OCUVITE PRESERVISION PO) Take 1 capsule by mouth daily.    Historical Provider, MD  pyridOXINE (VITAMIN B-6) 100 MG tablet Take 100 mg by mouth daily.    Historical Provider, MD  simethicone (MYLICON) 637 MG chewable tablet Chew 125 mg by mouth as needed for flatulence.    Historical Provider, MD  vitamin B-12 (CYANOCOBALAMIN) 1000 MCG tablet Take 1,000 mcg by mouth daily.    Historical Provider, MD  warfarin (COUMADIN) 2 MG tablet Take 1.5 to 2 tablets by mouth daily as directed by coumadin clinic 02/11/15   Troy Sine, MD     Current Medications: . furosemide  40 mg Intravenous Once  . sodium bicarbonate  50 mEq Intravenous Once  . sodium polystyrene  15 g Oral Once      Allergies:    Allergies  Allergen Reactions  . Avelox [Moxifloxacin Hcl In Nacl] Shortness Of Breath and Swelling  . Aricept [Donepezil Hcl] Diarrhea  . Codeine Swelling       . Garlic Other (See Comments)    Gi problems  . Latex Hives, Itching, Rash and Other (See Comments)    Watery blisters   . Onion Other (See Comments)    Gi problems  . Sulfa Drugs Cross Reactors Swelling  . Polysporin [Bacitracin-Polymyxin B] Other (See Comments)     Social History:   The  patient  reports that she has quit smoking. She has never used smokeless tobacco. She reports that she does not drink alcohol or use illicit drugs.     Family History:   The patient's family history includes Arthritis in her sister; Cancer in her father; Coronary artery disease in her mother; Heart disease in her father; Hypertension in her father and mother; Pneumonia in her mother.   ROS:  Review of Systems  Constitution: Negative for chills and fever.  Respiratory: Positive for cough.   Hematologic/Lymphatic: Negative for bleeding problem.  Gastrointestinal: Negative for diarrhea, hematochezia, melena, nausea and vomiting.  Genitourinary: Negative for dysuria and hematuria.  All other systems reviewed and are negative. Please see the history of present illness.     Vital Signs: Blood pressure 110/40, pulse 40, temperature 98.3 F (36.8 C), temperature source Oral, resp. rate 22, height 5\' 3"  (1.6 m), weight 125 lb (56.7 kg), SpO2 100 %.   PHYSICAL EXAM: General:  Well nourished, well developed, in no acute distress  HEENT: normal Lymph: no adenopathy Neck: + JVD Endocrine:  No thryomegaly Cardiac:  normal S1, S2; RRR; no murmur Lungs:  Decreased breath sounds, bibasilar crackles, no wheezing Abd: soft, nontender, no hepatomegaly Ext: Trace-1+ bilateral LE edema Musculoskeletal:  No deformities  Skin: warm and dry Neuro:  CNs 2-12 intact, no focal abnormalities noted Psych:  Normal affect    EKG:  Junctional brady, HR 43   Labs:   Recent Labs  05/08/15 1445  TROPONINI <0.03    Lab Results  Component Value Date   WBC 8.0 05/08/2015   HGB 11.2* 05/08/2015   HCT 34.3* 05/08/2015   MCV 91.7 05/08/2015   PLT 211 05/08/2015     Recent Labs Lab 05/08/15 1445  NA 135  K 6.9*  CL 107  CO2 21*  BUN 49*  CREATININE 1.91*  CALCIUM 8.6*  PROT 6.1*  BILITOT 0.4  ALKPHOS 27*  ALT 19  AST 30  GLUCOSE 87    Lab Results  Component Value Date   CHOL 136  09/14/2014   HDL 50 09/14/2014  [place] 62 09/14/2014   TRIG 119 09/14/2014    No results found for: DDIMER   Radiology/Studies:  Dg Chest Portable 1 View  05/08/2015   CLINICAL DATA:  81 year old female with a history of pulmonary edema. History arterial disease and cardiac disease.  EXAM: PORTABLE CHEST - 1 VIEW  COMPARISON:  None.  FINDINGS: Cardiomediastinal silhouette unchanged in size and contour. Mediastinum partially obscured by overlying defibrillator pads.  Calcifications aortic arch.  No evidence of pulmonary vascular congestion.  Low lung volumes.  No confluent airspace disease, pneumothorax, or pleural effusion. Persistent interstitial opacities.  IMPRESSION: No radiographic evidence of acute cardiopulmonary disease.  Defibrillator pads.  Atherosclerosis.  Signed,  Dulcy Fanny. Earleen Newport, DO  [facility]  [facility]   Electronically Signed   By: Corrie Mckusick D.O.   On: 05/08/2015 14:19     Principal Problem:   Bradycardia Active Problems:   Hyperkalemia   (HFpEF) heart failure with preserved ejection fraction   Chest pain   Moderate mitral regurgitation   PAF (paroxysmal atrial fibrillation)   ARF (acute renal failure)    ASSESSMENT AND PLAN:  1. Bradycardia: Patient is in a junctional rhythm in the setting of hyperkalemia and acute renal failure. She is currently being given insulin, dextrose and calcium gluconate. We will also give her Kayexalate 1. Internal medicine will admit the patient and we will follow along. Hold beta blocker for now.  2. Heart Failure with Preserved Ejection Fraction: She appears to be somewhat volume overloaded. This may be related to bradycardia. We will give her 1 dose of IV Lasix 40 mg. Obtain echocardiogram.  3. Hyperkalemia: This is being corrected as outlined above. The patient has been counseled on avoiding using potassium chloride as a salt substitute in the future.  4. Acute Renal Failure:  The patient's ACE inhibitor and hydrochlorothiazide will be held. Consider stopping her ACE inhibitor altogether. Further workup per internal medicine.  5. Paroxysmal Atrial Fibrillation: Maintaining sinus rhythm. Continue Coumadin.  6. Mitral Regurgitation: Moderate by most recent echocardiogram. Obtain follow-up echocardiogram as noted.  7. Chest Pain: She had a cardiac catheterization 8 months ago that demonstrated no significant disease. Check serial cardiac markers. Suspect her chest pain is noncardiac.   Signed, Richardson Dopp, PA-C  05/08/2015 4:46 PM

## 2015-05-08 NOTE — ED Notes (Signed)
  Pt transported to ct 

## 2015-05-08 NOTE — ED Notes (Signed)
CCM at bedside 

## 2015-05-08 NOTE — Progress Notes (Signed)
Attempted to call for report from ed Nurse. Ed nurse to call back.

## 2015-05-08 NOTE — ED Notes (Addendum)
Pt had syncopal episode as this nurse was taking BP, loc lasted 10 seconds or so.  Dr. Dayna Barker made aware.

## 2015-05-08 NOTE — Progress Notes (Signed)
[place] Progress Note Patient Name: [name] DOB: 07/30/1922 MRN: [id number]   Date of Service  05/08/2015  HPI/Events of Note  Nursing reporting n and v  eICU Interventions  Zofran IV prn      Intervention Category Minor Interventions: Routine modifications to care plan (e.g. PRN medications for pain, fever)  Christinia Gully 05/08/2015, 11:01 PM

## 2015-05-09 ENCOUNTER — Inpatient Hospital Stay (HOSPITAL_COMMUNITY): Payer: Medicare Other

## 2015-05-09 DIAGNOSIS — I509 Heart failure, unspecified: Secondary | ICD-10-CM

## 2015-05-09 LAB — TROPONIN I: Troponin I: 0.04 ng/mL — ABNORMAL HIGH (ref <0.031)

## 2015-05-09 LAB — URINALYSIS, ROUTINE W REFLEX MICROSCOPIC
Bilirubin Urine: NEGATIVE
Bilirubin Urine: NEGATIVE
Glucose, UA: NEGATIVE mg/dL
Glucose, UA: NEGATIVE mg/dL
Ketones, ur: NEGATIVE mg/dL
Ketones, ur: NEGATIVE mg/dL
Leukocytes, UA: NEGATIVE
Nitrite: NEGATIVE
Nitrite: NEGATIVE
Protein, ur: NEGATIVE mg/dL
Protein, ur: NEGATIVE mg/dL
Specific Gravity, Urine: 1.008 (ref 1.005–1.030)
Specific Gravity, Urine: 1.009 (ref 1.005–1.030)
Urobilinogen, UA: 0.2 mg/dL (ref 0.0–1.0)
Urobilinogen, UA: 0.2 mg/dL (ref 0.0–1.0)
pH: 5.5 (ref 5.0–8.0)
pH: 5 (ref 5.0–8.0)

## 2015-05-09 LAB — URINE MICROSCOPIC-ADD ON

## 2015-05-09 LAB — GLUCOSE, CAPILLARY: Glucose-Capillary: 82 mg/dL (ref 65–99)

## 2015-05-09 LAB — CBC
HCT: 34.4 % — ABNORMAL LOW (ref 36.0–46.0)
Hemoglobin: 11.6 g/dL — ABNORMAL LOW (ref 12.0–15.0)
MCH: 30.4 pg (ref 26.0–34.0)
MCHC: 33.7 g/dL (ref 30.0–36.0)
MCV: 90.1 fL (ref 78.0–100.0)
Platelets: 244 10*3/uL (ref 150–400)
RBC: 3.82 MIL/uL — ABNORMAL LOW (ref 3.87–5.11)
RDW: 13 % (ref 11.5–15.5)
WBC: 11.2 10*3/uL — ABNORMAL HIGH (ref 4.0–10.5)

## 2015-05-09 LAB — BASIC METABOLIC PANEL
Anion gap: 13 (ref 5–15)
BUN: 44 mg/dL — ABNORMAL HIGH (ref 6–20)
CO2: 21 mmol/L — ABNORMAL LOW (ref 22–32)
Calcium: 9.1 mg/dL (ref 8.9–10.3)
Chloride: 107 mmol/L (ref 101–111)
Creatinine, Ser: 1.54 mg/dL — ABNORMAL HIGH (ref 0.44–1.00)
GFR calc Af Amer: 33 mL/min — ABNORMAL LOW (ref >60)
GFR calc non Af Amer: 29 mL/min — ABNORMAL LOW (ref >60)
Glucose, Bld: 158 mg/dL — ABNORMAL HIGH (ref 65–99)
Potassium: 4.5 mmol/L (ref 3.5–5.1)
Sodium: 141 mmol/L (ref 135–145)

## 2015-05-09 LAB — PROTIME-INR
INR: 3.3 — ABNORMAL HIGH (ref 0.00–1.49)
Prothrombin Time: 32.9 seconds — ABNORMAL HIGH (ref 11.6–15.2)

## 2015-05-09 MED ORDER — GABAPENTIN 300 MG PO CAPS
300.0000 mg | ORAL_CAPSULE | Freq: Three times a day (TID) | ORAL | Status: DC
  Administered 2015-05-09 – 2015-05-12 (×8): 300 mg via ORAL
  Filled 2015-05-09 (×8): qty 1

## 2015-05-09 MED ORDER — HYDROCODONE-ACETAMINOPHEN 5-325 MG PO TABS
1.0000 | ORAL_TABLET | Freq: Once | ORAL | Status: AC
  Administered 2015-05-09: 1 via ORAL
  Filled 2015-05-09: qty 1

## 2015-05-09 MED ORDER — ENSURE ENLIVE PO LIQD
237.0000 mL | Freq: Two times a day (BID) | ORAL | Status: DC
  Administered 2015-05-09 – 2015-05-10 (×3): 237 mL via ORAL

## 2015-05-09 NOTE — Progress Notes (Signed)
[place] Progress Note Patient Name: [name] DOB: Dec 10, 1921 MRN: [id number]   Date of Service  05/09/2015  HPI/Events of Note  Chronic pain. Patient requests home Neurontin and Norco.   eICU Interventions  Will order: 1. Neurontin 300 mg PO now and TID. 2. Norco 5/325 mg PO X 1 now.      Intervention Category Intermediate Interventions: Pain - evaluation and management  Lysle Dingwall 05/09/2015, 8:33 PM

## 2015-05-09 NOTE — Progress Notes (Signed)
Initial Nutrition Assessment  INTERVENTION:   Ensure Enlive po BID, each supplement provides 350 kcal and 20 grams of protein  NUTRITION DIAGNOSIS:   Inadequate oral intake related to lethargy/confusion as evidenced by meal completion < 50%.  GOAL:   Patient will meet greater than or equal to 90% of their needs  MONITOR:   Labs, Skin, PO intake, Supplement acceptance  REASON FOR ASSESSMENT:   Low [place]   ASSESSMENT:   81 year old woman with hypertension, coronary disease, atrial fibrillation admitted with weakness and found to have symptomatic bradycardia in the setting of acute renal failure and hyperkalemia.   Per son pt is very independent. Lives in an apartment at [facility]. Pt likes ensure has had no recent weight changes and good appetite.  Pt very pleasant but unable to stay awake during interview.   Diet Order:  DIET DYS 3 Room service appropriate?: Yes; Fluid consistency:: Thin  Skin:  Reviewed, no issues  Last BM:  unknown  Height:   Ht Readings from Last 1 Encounters:  05/08/15 5\' 3"  (1.6 m)   Weight:   Wt Readings from Last 1 Encounters:  05/09/15 136 lb 0.4 oz (61.7 kg)   Ideal Body Weight:  52.2 kg  BMI:  Body mass index is 24.1 kg/(m^2).  Estimated Nutritional Needs:   Kcal:  1400-1600  Protein:  70-85 grams  Fluid:  > 1.5 L/day  EDUCATION NEEDS:   No education needs identified at this time  [place], [place], [place] Pager [phone] After Hours Pager

## 2015-05-09 NOTE — Progress Notes (Addendum)
PULMONARY / CRITICAL CARE MEDICINE   Name: [name] MRN: [id number] DOB: Mar 15, 1922    ADMISSION DATE:  05/08/2015  REFERRING MD :  [facility] ED  CHIEF COMPLAINT:  Weakness, near syncope  INITIAL PRESENTATION: 81 year old woman with hypertension, coronary disease, atrial fibrillation admitted with weakness and found to have symptomatic bradycardia in the setting of acute renal failure and hyperkalemia.   STUDIES:  CXR 9/18 >> no infiltrates, effusion. Aortic calcification present TTE 11/16/13 >> Normal LV function, 60-65%, moderate MR, TR with pulmonary hypertension with an estimated PASP of 61 mmHg (? Whether this is overestimated in setting TR) Left and right heart cath 08/2014 >> No significant CAD, borderline mild pulmonary hypertension  SIGNIFICANT EVENTS: Admitted 9/18  HISTORY OF PRESENT ILLNESS:  81 yo woman, hx of CAD, mild AS, moderate MR, HTN, A Fib on warfarin. She is obtunded on my arrival and the history is taken from her son at bedside. He reports that she developed weakness beginning 2 days ago, experienced an episode of near syncope. She presented to the emergency department 9/18 and was found to have a bradycardic junctional rhythm in the low 40s with associated hypotension. She had a syncopal episode in the ED. Lab work revealed new renal insufficiency and associated hyperkalemia with a K 6.9. She is on carvedilol, diltiazem, benazepril and has taken these medications reliably. She has had some increasing lower extremity edema and states that she has used salt and salt substitute (KCl). Also has had a significant stress in her life due to the death of a family member. She was given IV fluids, insulin and dextrose and calcium in the Emergency department. Followed by glucagon and some pushes of atropine. Cardiology has seen her, no pacer planned at this time. Despite these interventions her MS has worsened - on my arrival she is obtunded. I have discussed her living will and  code status with her son at bedside who confirms that she desires to be DNR, no resuscitation, but that she would want all medical therapy that could reverse this situation. We will admit her to ICU for monitoring and support.   SUBJECTIVE:  Patient is very lethargic this AM but following commands with a delay.  Fluctuating mental status overnight.  VITAL SIGNS: Temp:  [96.5 F (35.8 C)-98.3 F (36.8 C)] 97 F (36.1 C) (09/19 0429) Pulse Rate:  [29-115] 112 (09/18 2200) Resp:  [12-29] 22 (09/19 0500) BP: (81-162)/(29-107) 130/52 mmHg (09/19 0500) SpO2:  [83 %-100 %] 94 % (09/19 0429) Weight:  [56.7 kg (125 lb)-61.7 kg (136 lb 0.4 oz)] 61.7 kg (136 lb 0.4 oz) (09/18 2125)  HEMODYNAMICS:    VENTILATOR SETTINGS:    INTAKE / OUTPUT:  Intake/Output Summary (Last 24 hours) at 05/09/15 0954 Last data filed at 05/09/15 0830  Gross per 24 hour  Intake 684.97 ml  Output    800 ml  Net -115.03 ml   PHYSICAL EXAMINATION: General:  Elderly ill appearing woman, lethargic but aoursable Neuro:  Snoring, moving all ext to command but noticeably very lethargic. HEENT:  OP very dry, PERRL Cardiovascular:  Brady in 40's, regular Lungs:  Coarse bilaterally Abdomen:  Soft, NT, + BS Musculoskeletal:  Trace B LE edema Skin:  No rash  LABS:  CBC  Recent Labs Lab 05/08/15 1445 05/08/15 1935 05/09/15 0452  WBC 8.0 10.2 11.2*  HGB 11.2* 11.9* 11.6*  HCT 34.3* 36.0 34.4*  PLT 211 223 244   Coag's  Recent Labs Lab 05/05/15 05/08/15 1713  INR 2.0* 4.58*   BMET  Recent Labs Lab 05/08/15 1935 05/08/15 2220 05/09/15 0452  NA 137 137 141  K 5.0 5.0 4.5  CL 107 104 107  CO2 21* 20* 21*  BUN 46* 41* 44*  CREATININE 1.60* 1.56* 1.54*  GLUCOSE 117* 158* 158*   Electrolytes  Recent Labs Lab 05/08/15 1935 05/08/15 2220 05/09/15 0452  CALCIUM 8.9 8.5* 9.1   Sepsis Markers  Recent Labs Lab 05/08/15 1944  LATICACIDVEN 0.84   ABG  Recent Labs Lab 05/08/15 1903   PHART 7.512*  PCO2ART 29.8*  PO2ART 121.0*   Liver Enzymes  Recent Labs Lab 05/08/15 1445  AST 30  ALT 19  ALKPHOS 27*  BILITOT 0.4  ALBUMIN 3.4*   Cardiac Enzymes  Recent Labs Lab 05/08/15 1713 05/08/15 2220 05/09/15 0452  TROPONINI <0.03 <0.03 0.04*   Glucose  Recent Labs Lab 05/08/15 1654 05/08/15 1750 05/08/15 1940  GLUCAP 231* 85 98   Imaging Ct Head Wo Contrast  05/08/2015   CLINICAL DATA:  81 year old with altered mental status. Initial encounter.  EXAM: CT HEAD WITHOUT CONTRAST  TECHNIQUE: Contiguous axial images were obtained from the base of the skull through the vertex without intravenous contrast.  COMPARISON:  MRI brain 07/17/2014-no report.  FINDINGS: There is no evidence of acute intracranial hemorrhage, mass lesion, brain edema or extra-axial fluid collection. The ventricles and subarachnoid spaces are prominent but stable. There is no CT evidence of acute cortical infarction. There is stable minimal chronic periventricular white matter disease. Intracranial vascular calcifications are present.  There is chronic sinus disease with near-complete opacification of the left maxillary, ethmoid and frontal sinuses. There is mucosal thickening and probable fluid in the right maxillary sinus. There is a small amount of fluid in the mastoid air cells on the right. The calvarium is intact.  IMPRESSION: 1. No acute intracranial findings. 2. Chronic cerebral atrophy. 3. Chronic paranasal sinus inflammatory changes, slightly worse from previous MRI.   Electronically Signed   By: Richardean Sale M.D.   On: 05/08/2015 18:28   Dg Chest Portable 1 View  05/08/2015   CLINICAL DATA:  81 year old female with a history of pulmonary edema. History arterial disease and cardiac disease.  EXAM: PORTABLE CHEST - 1 VIEW  COMPARISON:  None.  FINDINGS: Cardiomediastinal silhouette unchanged in size and contour. Mediastinum partially obscured by overlying defibrillator pads.  Calcifications  aortic arch.  No evidence of pulmonary vascular congestion.  Low lung volumes.  No confluent airspace disease, pneumothorax, or pleural effusion. Persistent interstitial opacities.  IMPRESSION: No radiographic evidence of acute cardiopulmonary disease.  Defibrillator pads.  Atherosclerosis.  Signed,  Dulcy Fanny. Earleen Newport, DO  [facility]  [facility]   Electronically Signed   By: Corrie Mckusick D.O.   On: 05/08/2015 14:19   ASSESSMENT / PLAN: CARDIOVASCULAR A:  Severe bradycardia, junctional rhythm, presumed due to hyperkalemia and meds in setting renal failure.  CAD A fib on coumadin HTN MV regurgitation P:  - Continue to hold coreg, dilt, ACE-i - IVF bolus again now - Has received Ca, glucose / insulin - Titrate dopamine to off and do not restart, DNR status. - D/C glucagon now - Recheck BMP now - Defer pacer placement to cards, doubt necessary now.  RENAL A:  Acute renal failure, suspect hypovolemia + ACE-i Hyperkalemia  P:   - IVF and K treatment as above - Repeat labs - Replace electrolytes as indicated. - Hold ACE-I, will not d/c patient on  it given renal function and earlier presentation.  PULMONARY A: Inability to protect airway due to altered MS P:   - DNR code status, no intubation. - Supplemental O2 - Pulm hygiene, suction as able  GASTROINTESTINAL A:  SUP Enteral access P:   - PPI - Hold off panda placement for now, if awake enough then SLP.  HEMATOLOGIC A:  Mild anemia Anticoag on coumadin P:  - Follow CBC and INR  INFECTIOUS A:  No evidence acute infection P:   - Follow off abx at this time.   ENDOCRINE A:  No acute issues    P:    NEUROLOGIC A:  Obtundation, acute encephalopathy, suspect toxic - metabolic  P:   - Supportive care - Avoid any sedating meds.  - Check U/A and CXR for signs of infection given AMS. - Would like to get an MRI but patient is not cooperative, only option is to intubate to  test but that is against the wishes of the patient and son who is bedside and has been made aware of that.  FAMILY  - Updates: Son updated bedside, confirmed DNR status, discussed with cards, if able to get off dopamine then will transfer to SDU and to [facility] service with PCCM off 9/20.  - Inter-disciplinary family meet or Palliative Care meeting due by:  05/15/15  The patient is critically ill with multiple organ systems failure and requires high complexity decision making for assessment and support, frequent evaluation and titration of therapies, application of advanced monitoring technologies and extensive interpretation of multiple databases.   Critical Care Time devoted to patient care services described in this note is  35  Minutes. This time reflects time of care of this signee Dr Jennet Maduro. This critical care time does not reflect procedure time, or teaching time or supervisory time of PA/NP/Med student/Med Resident etc but could involve care discussion time.  Rush Farmer, M.D. [facility] Pulmonary/Critical Care Medicine. Pager: [phone]. After hours pager: [phone].

## 2015-05-09 NOTE — Progress Notes (Signed)
  Echocardiogram 2D Echocardiogram has been performed.  [name] 05/09/2015, 11:48 AM

## 2015-05-09 NOTE — Progress Notes (Signed)
Patient Name: [name] Date of Encounter: 05/09/2015  Principal Problem:   Bradycardia Active Problems:   Moderate mitral regurgitation   PAF (paroxysmal atrial fibrillation)   Hyperkalemia   (HFpEF) heart failure with preserved ejection fraction   Chest pain   ARF (acute renal failure)   Renal failure   Length of Stay: 1  SUBJECTIVE  Disoriented, agitated. Delirium?  CURRENT MEDS . pantoprazole (PROTONIX) IV  40 mg Intravenous QHS    OBJECTIVE   Intake/Output Summary (Last 24 hours) at 05/09/15 0909 Last data filed at 05/09/15 0500  Gross per 24 hour  Intake 684.97 ml  Output      0 ml  Net 684.97 ml   Filed Weights   05/08/15 1352 05/08/15 2125  Weight: 125 lb (56.7 kg) 136 lb 0.4 oz (61.7 kg)    PHYSICAL EXAM Filed Vitals:   05/09/15 0100 05/09/15 0200 05/09/15 0429 05/09/15 0500  BP: 125/64 138/107 120/71 130/52  Pulse:      Temp:   97 F (36.1 C)   TempSrc:   Axillary   Resp: 21 17 17 22   Height:      Weight:      SpO2:   94%    General: Alert, oriented x3, no distress Head: no evidence of trauma, PERRL, EOMI, no exophtalmos or lid lag, no myxedema, no xanthelasma; normal ears, nose and oropharynx Neck: normal jugular venous pulsations and no hepatojugular reflux; brisk carotid pulses without delay and no carotid bruits Chest: clear to auscultation, no signs of consolidation by percussion or palpation, normal fremitus, symmetrical and full respiratory excursions Cardiovascular: normal position and quality of the apical impulse, regular rhythm, normal first and second heart sounds, no rubs or gallops, no murmur Abdomen: no tenderness or distention, no masses by palpation, no abnormal pulsatility or arterial bruits, normal bowel sounds, no hepatosplenomegaly Extremities: no clubbing, cyanosis or edema; 2+ radial, ulnar and brachial pulses bilaterally; 2+ right femoral, posterior tibial and dorsalis pedis pulses; 2+ left femoral, posterior  tibial and dorsalis pedis pulses; no subclavian or femoral bruits Neurological: grossly nonfocal  LABS  CBC  Recent Labs  05/08/15 1445 05/08/15 1935 05/09/15 0452  WBC 8.0 10.2 11.2*  NEUTROABS 5.6  --   --   HGB 11.2* 11.9* 11.6*  HCT 34.3* 36.0 34.4*  MCV 91.7 91.8 90.1  PLT 211 223 382   Basic Metabolic Panel  Recent Labs  05/08/15 2220 05/09/15 0452  NA 137 141  K 5.0 4.5  CL 104 107  CO2 20* 21*  GLUCOSE 158* 158*  BUN 41* 44*  CREATININE 1.56* 1.54*  CALCIUM 8.5* 9.1   Liver Function Tests  Recent Labs  05/08/15 1445  AST 30  ALT 19  ALKPHOS 27*  BILITOT 0.4  PROT 6.1*  ALBUMIN 3.4*   No results for input(s): LIPASE, AMYLASE in the last 72 hours. Cardiac Enzymes  Recent Labs  05/08/15 1713 05/08/15 2220 05/09/15 0452  TROPONINI <0.03 <0.03 0.04*    Radiology Studies Imaging results have been reviewed and Ct Head Wo Contrast  05/08/2015   CLINICAL DATA:  81 year old with altered mental status. Initial encounter.  EXAM: CT HEAD WITHOUT CONTRAST  TECHNIQUE: Contiguous axial images were obtained from the base of the skull through the vertex without intravenous contrast.  COMPARISON:  MRI brain 07/17/2014-no report.  FINDINGS: There is no evidence of acute intracranial hemorrhage, mass lesion, brain edema or extra-axial fluid collection. The ventricles and subarachnoid spaces are prominent but stable. There  is no CT evidence of acute cortical infarction. There is stable minimal chronic periventricular white matter disease. Intracranial vascular calcifications are present.  There is chronic sinus disease with near-complete opacification of the left maxillary, ethmoid and frontal sinuses. There is mucosal thickening and probable fluid in the right maxillary sinus. There is a small amount of fluid in the mastoid air cells on the right. The calvarium is intact.  IMPRESSION: 1. No acute intracranial findings. 2. Chronic cerebral atrophy. 3. Chronic paranasal  sinus inflammatory changes, slightly worse from previous MRI.   Electronically Signed   By: Richardean Sale M.D.   On: 05/08/2015 18:28   Dg Chest Portable 1 View  05/08/2015   CLINICAL DATA:  81 year old female with a history of pulmonary edema. History arterial disease and cardiac disease.  EXAM: PORTABLE CHEST - 1 VIEW  COMPARISON:  None.  FINDINGS: Cardiomediastinal silhouette unchanged in size and contour. Mediastinum partially obscured by overlying defibrillator pads.  Calcifications aortic arch.  No evidence of pulmonary vascular congestion.  Low lung volumes.  No confluent airspace disease, pneumothorax, or pleural effusion. Persistent interstitial opacities.  IMPRESSION: No radiographic evidence of acute cardiopulmonary disease.  Defibrillator pads.  Atherosclerosis.  Signed,  Dulcy Fanny. Earleen Newport, DO  [facility]  [facility]   Electronically Signed   By: Corrie Mckusick D.O.   On: 05/08/2015 14:19    TELE NSR    ASSESSMENT AND PLAN  Junctional rhythm and bradycardia have resolved after normalization of electrolytes and discontinuation of (relatively low doses of) carvedilol and diltiazem. Hyperkalemia in turn appears to be secondary to acute renal failure (maybe related to reduced PO intake and continued treatment with ACEi and HCTZ?). This would not explain her residual delirium/disorientation. Note that she may have dysnomia - refers to her son as "the empty nester" rather than by his name. Her son reports that she was very weak, but fully "cognizant" and oriented before arriving in the ED.  Continue to avoid meds that may cause bradycardia for the time being and avoid HCTZ and ACEi/ARB until renal function closer to baseline. Her BP seems to be fine without them. Consider hydralazine if BP rises. Recommend MRI of brain if she does not clear up quickly.  Sanda Klein, MD, [facility] [phone] office [phone]  pager 05/09/2015 9:09 AM

## 2015-05-09 NOTE — Progress Notes (Signed)
[place] Progress Note Patient Name: [name] DOB: 1922/06/26 MRN: [id number]   Date of Service  05/09/2015  HPI/Events of Note  RN calling eMD - questining if there eis role for glucagon infusion. Chart reviewed - patien  With possible beta blocker overaction. Currently on dopamine 47mcg and glubacagon infusion - HR 69  eICU Interventions  [place] reoports role for infusion of glucagon but then patients can develop tachyphylaxis  RN advised to d/w cardiology     Intervention Category Major Interventions: Arrhythmia - evaluation and management  RAMASWAMY,MURALI 05/09/2015, 4:46 AM

## 2015-05-09 NOTE — Care Management Note (Signed)
Case Management Note  Patient Details  Name: ANGELLE ISAIS MRN: [id number] Date of Birth: [birth year]  Subjective/Objective:      Adm w bradycardia              Action/Plan: from [facility] [place] , [place] ref   Expected Discharge Date:                 Expected Discharge Plan:  [place]  In-House Referral:  Clinical Social Work  Discharge planning Services     Post Acute Care Choice:    Choice offered to:     DME Arranged:    DME Agency:     HH Arranged:    [place] Agency:     Status of Service:     Medicare Important Message Given:    Date Medicare IM Given:    Medicare IM give by:    Date Additional Medicare IM Given:    Additional Medicare Important Message give by:     If discussed at [place] of Stay Meetings, dates discussed:    Additional Comments: ur review done  Lacretia Leigh, RN 05/09/2015, 8:57 AM

## 2015-05-10 DIAGNOSIS — J810 Acute pulmonary edema: Secondary | ICD-10-CM | POA: Insufficient documentation

## 2015-05-10 DIAGNOSIS — I340 Nonrheumatic mitral (valve) insufficiency: Secondary | ICD-10-CM

## 2015-05-10 DIAGNOSIS — I480 Paroxysmal atrial fibrillation: Secondary | ICD-10-CM

## 2015-05-10 LAB — CBC
HCT: 33.3 % — ABNORMAL LOW (ref 36.0–46.0)
Hemoglobin: 11.2 g/dL — ABNORMAL LOW (ref 12.0–15.0)
MCH: 29.9 pg (ref 26.0–34.0)
MCHC: 33.6 g/dL (ref 30.0–36.0)
MCV: 88.8 fL (ref 78.0–100.0)
Platelets: 230 10*3/uL (ref 150–400)
RBC: 3.75 MIL/uL — ABNORMAL LOW (ref 3.87–5.11)
RDW: 13.5 % (ref 11.5–15.5)
WBC: 6.8 10*3/uL (ref 4.0–10.5)

## 2015-05-10 LAB — BASIC METABOLIC PANEL
Anion gap: 8 (ref 5–15)
BUN: 27 mg/dL — ABNORMAL HIGH (ref 6–20)
CO2: 24 mmol/L (ref 22–32)
Calcium: 9.4 mg/dL (ref 8.9–10.3)
Chloride: 107 mmol/L (ref 101–111)
Creatinine, Ser: 0.9 mg/dL (ref 0.44–1.00)
GFR calc Af Amer: >60 mL/min (ref >60)
GFR calc non Af Amer: 55 mL/min — ABNORMAL LOW (ref >60)
Glucose, Bld: 78 mg/dL (ref 65–99)
Potassium: 4.2 mmol/L (ref 3.5–5.1)
Sodium: 139 mmol/L (ref 135–145)

## 2015-05-10 LAB — PROTIME-INR
INR: 1.93 — ABNORMAL HIGH (ref 0.00–1.49)
Prothrombin Time: 22 seconds — ABNORMAL HIGH (ref 11.6–15.2)

## 2015-05-10 LAB — PHOSPHORUS: Phosphorus: 2.4 mg/dL — ABNORMAL LOW (ref 2.5–4.6)

## 2015-05-10 LAB — MAGNESIUM: Magnesium: 1.3 mg/dL — ABNORMAL LOW (ref 1.7–2.4)

## 2015-05-10 MED ORDER — BENAZEPRIL HCL 40 MG PO TABS
40.0000 mg | ORAL_TABLET | Freq: Every day | ORAL | Status: DC
  Administered 2015-05-10 – 2015-05-11 (×2): 40 mg via ORAL
  Filled 2015-05-10 (×3): qty 1

## 2015-05-10 MED ORDER — CYCLOSPORINE 0.05 % OP EMUL
1.0000 [drp] | Freq: Every day | OPHTHALMIC | Status: DC
  Administered 2015-05-12: 1 [drp] via OPHTHALMIC
  Filled 2015-05-10 (×2): qty 1

## 2015-05-10 MED ORDER — FENOFIBRATE 160 MG PO TABS: 160.0000 mg | ORAL_TABLET | Freq: Every day | ORAL | Status: DC

## 2015-05-10 MED ORDER — WARFARIN SODIUM 2 MG PO TABS
3.0000 mg | ORAL_TABLET | Freq: Once | ORAL | Status: AC
  Administered 2015-05-10: 3 mg via ORAL
  Filled 2015-05-10 (×2): qty 1

## 2015-05-10 MED ORDER — HYDRALAZINE HCL 20 MG/ML IJ SOLN
5.0000 mg | INTRAMUSCULAR | Status: DC | PRN
  Administered 2015-05-10: 5 mg via INTRAVENOUS
  Filled 2015-05-10: qty 1

## 2015-05-10 MED ORDER — DILTIAZEM HCL ER 60 MG PO CP12
60.0000 mg | ORAL_CAPSULE | Freq: Two times a day (BID) | ORAL | Status: DC
  Administered 2015-05-10 – 2015-05-12 (×5): 60 mg via ORAL
  Filled 2015-05-10 (×8): qty 1

## 2015-05-10 MED ORDER — BENAZEPRIL HCL 40 MG PO TABS: 40.0000 mg | ORAL_TABLET | Freq: Every day | ORAL | Status: DC

## 2015-05-10 MED ORDER — COLESTIPOL HCL 1 G PO TABS
1.0000 g | ORAL_TABLET | Freq: Two times a day (BID) | ORAL | Status: DC | PRN
  Administered 2015-05-10: 1 g via ORAL
  Filled 2015-05-10 (×3): qty 1

## 2015-05-10 MED ORDER — HYDROCHLOROTHIAZIDE 25 MG PO TABS
25.0000 mg | ORAL_TABLET | Freq: Every day | ORAL | Status: DC
  Administered 2015-05-10 – 2015-05-12 (×3): 25 mg via ORAL
  Filled 2015-05-10 (×3): qty 1

## 2015-05-10 MED ORDER — SODIUM PHOSPHATE 3 MMOLE/ML IV SOLN
10.0000 mmol | Freq: Once | INTRAVENOUS | Status: AC
  Administered 2015-05-10: 10 mmol via INTRAVENOUS
  Filled 2015-05-10: qty 3.33

## 2015-05-10 MED ORDER — MAGNESIUM SULFATE 4 GM/100ML IV SOLN
4.0000 g | Freq: Once | INTRAVENOUS | Status: AC
  Administered 2015-05-10: 4 g via INTRAVENOUS
  Filled 2015-05-10: qty 100

## 2015-05-10 MED ORDER — DOCUSATE SODIUM 100 MG PO CAPS: 100.0000 mg | ORAL_CAPSULE | Freq: Two times a day (BID) | ORAL | Status: DC

## 2015-05-10 MED ORDER — ATORVASTATIN CALCIUM 10 MG PO TABS
10.0000 mg | ORAL_TABLET | Freq: Every day | ORAL | Status: DC
  Administered 2015-05-11 – 2015-05-12 (×2): 10 mg via ORAL
  Filled 2015-05-10 (×2): qty 1

## 2015-05-10 MED ORDER — HYPROMELLOSE (GONIOSCOPIC) 2.5 % OP SOLN
1.0000 [drp] | OPHTHALMIC | Status: DC | PRN
  Filled 2015-05-10: qty 15

## 2015-05-10 MED ORDER — IRON 28 MG PO TABS: 1.0000 | ORAL_TABLET | Freq: Two times a day (BID) | ORAL | Status: DC

## 2015-05-10 MED ORDER — PANTOPRAZOLE SODIUM 40 MG PO TBEC
40.0000 mg | DELAYED_RELEASE_TABLET | Freq: Every day | ORAL | Status: DC
  Administered 2015-05-10 – 2015-05-12 (×3): 40 mg via ORAL
  Filled 2015-05-10 (×3): qty 1

## 2015-05-10 MED ORDER — FERROUS SULFATE 325 (65 FE) MG PO TABS
325.0000 mg | ORAL_TABLET | Freq: Every day | ORAL | Status: DC
  Administered 2015-05-11 – 2015-05-12 (×2): 325 mg via ORAL
  Filled 2015-05-10 (×2): qty 1

## 2015-05-10 MED ORDER — WARFARIN - PHARMACIST DOSING INPATIENT: Freq: Every day | Status: DC

## 2015-05-10 NOTE — Progress Notes (Signed)
ANTICOAGULATION CONSULT NOTE - Initial Consult  Pharmacy Consult for warfarin Indication: atrial fibrillation  Allergies  Allergen Reactions  . Avelox [Moxifloxacin Hcl In Nacl] Shortness Of Breath and Swelling  . Aricept [Donepezil Hcl] Diarrhea  . Codeine Swelling       . Garlic Other (See Comments)    Gi problems  . Latex Hives, Itching, Rash and Other (See Comments)    Watery blisters   . Onion Other (See Comments)    Gi problems  . Sulfa Drugs Cross Reactors Swelling  . Polysporin [Bacitracin-Polymyxin B] Other (See Comments)    Patient Measurements: Height: 5\' 3"  (160 cm) Weight: 129 lb 10.1 oz (58.8 kg) IBW/kg (Calculated) : 52.4  Vital Signs: Temp: 98.6 F (37 C) (09/20 1653) Temp Source: Oral (09/20 1653) BP: 180/71 mmHg (09/20 1802) Pulse Rate: 88 (09/20 1802)  Labs:  Recent Labs  05/08/15 1713 05/08/15 1935 05/08/15 2220 05/09/15 0452 05/09/15 1140 05/10/15 0336 05/10/15 1820  HGB  --  11.9*  --  11.6*  --  11.2*  --   HCT  --  36.0  --  34.4*  --  33.3*  --   PLT  --  223  --  244  --  230  --   LABPROT 42.1*  --   --   --  32.9*  --  22.0*  INR 4.58*  --   --   --  3.30*  --  1.93*  CREATININE  --  1.60* 1.56* 1.54*  --  0.90  --   TROPONINI <0.03  --  <0.03 0.04*  --   --   --     Estimated Creatinine Clearance: 34.4 mL/min (by C-G formula based on Cr of 0.9).   Medical History: Past Medical History  Diagnosis Date  . Hypertension   . Hyperlipidemia   . GERD (gastroesophageal reflux disease)   . Anxiety   . Mitral prolapse   . Degenerative arthritis   . Neuropathy   . Peripheral neuropathy     Small fiber   . Diverticulosis   . Mild renal insufficiency   . Hx of echocardiogram 07/30/2012    EF 55-60%; mild LVH; mild AV stenosis & trivial regurg.; mod MV regurg.; LA severly dilated; RV systolic pressure increased consistent with severe pulm. hypertension; RA moderately dilated; mod TV regurg.;   . History of nuclear stress test  09/19/2007    normal pattern of perfusion; post-stress EF 86%; EKG negative for ischemia; low risk scan  . Carotid arterial disease 07/30/2012    carotid doppler; normal study  . Foot fracture, left   . Memory disorder 03/05/2014  . Chronic anticoagulation, with coumadin, with PAF and CHADS2Vasc2 score of 4 09/16/2014  . S/P cardiac cath 09/14/14 mild calcification of ostial RCA but otherwise normal coronary arteries 09/14/2014  . Anemia, iron deficiency 09/16/2014     Assessment: 81 YOF admitted with symptomatic bradycardia. PTA she is on warfarin (home dose: 3mg  daily except 4mg  on MWF, last dose 9/17) for AFib (CHADSVASC = 4). Her INR was elevated on admission at 4.58 and warfarin has been held. She has NOT received vitamin K.  Pharmacy asked to resume warfarin tonight. Her INR drawn this evening is below goal at 1.93. Hgb 11.2, plts 230- no bleeding noted.  Goal of Therapy:  INR 2-3 Monitor platelets by anticoagulation protocol: Yes   Plan:  -warfarin 3mg  po x1 tonight -daily INR -follow for s/s bleeding  Lauren D. Bajbus, PharmD, BCPS Clinical  Pharmacist Pager: [phone] 05/10/2015 7:02 PM

## 2015-05-10 NOTE — Progress Notes (Signed)
Patient Name: [name] Date of Encounter: 05/10/2015  Principal Problem:   Bradycardia Active Problems:   Moderate mitral regurgitation   PAF (paroxysmal atrial fibrillation)   Hyperkalemia   (HFpEF) heart failure with preserved ejection fraction   Chest pain   ARF (acute renal failure)   Renal failure   Length of Stay: 2  SUBJECTIVE  Remarkable improvement - oriented and "back to normal"  CURRENT MEDS . diltiazem  60 mg Oral Q12H  . feeding supplement (ENSURE ENLIVE)  237 mL Oral BID BM  . gabapentin  300 mg Oral TID  . pantoprazole  40 mg Oral Daily  . sodium phosphate  Dextrose 5% IVPB  10 mmol Intravenous Once    OBJECTIVE   Intake/Output Summary (Last 24 hours) at 05/10/15 1131 Last data filed at 05/10/15 0900  Gross per 24 hour  Intake    612 ml  Output   1930 ml  Net  -1318 ml   Filed Weights   05/08/15 2125 05/09/15 0500 05/10/15 0600  Weight: 136 lb 0.4 oz (61.7 kg) 136 lb 0.4 oz (61.7 kg) 129 lb 10.1 oz (58.8 kg)    PHYSICAL EXAM Filed Vitals:   05/10/15 0324 05/10/15 0600 05/10/15 0717 05/10/15 0900  BP: 144/77   154/83  Pulse: 78   78  Temp: 98 F (36.7 C)  98 F (36.7 C) 98.2 F (36.8 C)  TempSrc: Oral  Oral Oral  Resp: 12   24  Height:  5\' 3"  (1.6 m)    Weight:  129 lb 10.1 oz (58.8 kg)    SpO2: 98%   100%   General: Alert, oriented x3, no distress Head: no evidence of trauma, PERRL, EOMI, no exophtalmos or lid lag, no myxedema, no xanthelasma; normal ears, nose and oropharynx Neck: normal jugular venous pulsations and no hepatojugular reflux; brisk carotid pulses without delay and no carotid bruits Chest: clear to auscultation, no signs of consolidation by percussion or palpation, normal fremitus, symmetrical and full respiratory excursions Cardiovascular: normal position and quality of the apical impulse, regular rhythm, normal first and second heart sounds, no rubs or gallops, 2/6 systolic murmur Abdomen: no tenderness or  distention, no masses by palpation, no abnormal pulsatility or arterial bruits, normal bowel sounds, no hepatosplenomegaly Extremities: no clubbing, cyanosis or edema; 2+ radial, ulnar and brachial pulses bilaterally; 2+ right femoral, posterior tibial and dorsalis pedis pulses; 2+ left femoral, posterior tibial and dorsalis pedis pulses; no subclavian or femoral bruits Neurological: grossly nonfocal  LABS  CBC  Recent Labs  05/08/15 1445  05/09/15 0452 05/10/15 0336  WBC 8.0  < > 11.2* 6.8  NEUTROABS 5.6  --   --   --   HGB 11.2*  < > 11.6* 11.2*  HCT 34.3*  < > 34.4* 33.3*  MCV 91.7  < > 90.1 88.8  PLT 211  < > 244 230  < > = values in this interval not displayed. Basic Metabolic Panel  Recent Labs  05/09/15 0452 05/10/15 0336  NA 141 139  K 4.5 4.2  CL 107 107  CO2 21* 24  GLUCOSE 158* 78  BUN 44* 27*  CREATININE 1.54* 0.90  CALCIUM 9.1 9.4  MG  --  1.3*  PHOS  --  2.4*   Liver Function Tests  Recent Labs  05/08/15 1445  AST 30  ALT 19  ALKPHOS 27*  BILITOT 0.4  PROT 6.1*  ALBUMIN 3.4*   No results for input(s): LIPASE, AMYLASE in the  last 72 hours. Cardiac Enzymes  Recent Labs  05/08/15 1713 05/08/15 2220 05/09/15 0452  TROPONINI <0.03 <0.03 0.04*    Radiology Studies Imaging results have been reviewed and Ct Head Wo Contrast  05/08/2015   CLINICAL DATA:  81 year old with altered mental status. Initial encounter.  EXAM: CT HEAD WITHOUT CONTRAST  TECHNIQUE: Contiguous axial images were obtained from the base of the skull through the vertex without intravenous contrast.  COMPARISON:  MRI brain 07/17/2014-no report.  FINDINGS: There is no evidence of acute intracranial hemorrhage, mass lesion, brain edema or extra-axial fluid collection. The ventricles and subarachnoid spaces are prominent but stable. There is no CT evidence of acute cortical infarction. There is stable minimal chronic periventricular white matter disease. Intracranial vascular  calcifications are present.  There is chronic sinus disease with near-complete opacification of the left maxillary, ethmoid and frontal sinuses. There is mucosal thickening and probable fluid in the right maxillary sinus. There is a small amount of fluid in the mastoid air cells on the right. The calvarium is intact.  IMPRESSION: 1. No acute intracranial findings. 2. Chronic cerebral atrophy. 3. Chronic paranasal sinus inflammatory changes, slightly worse from previous MRI.   Electronically Signed   By: Richardean Sale M.D.   On: 05/08/2015 18:28   Dg Chest Portable 1 View  05/08/2015   CLINICAL DATA:  81 year old female with a history of pulmonary edema. History arterial disease and cardiac disease.  EXAM: PORTABLE CHEST - 1 VIEW  COMPARISON:  None.  FINDINGS: Cardiomediastinal silhouette unchanged in size and contour. Mediastinum partially obscured by overlying defibrillator pads.  Calcifications aortic arch.  No evidence of pulmonary vascular congestion.  Low lung volumes.  No confluent airspace disease, pneumothorax, or pleural effusion. Persistent interstitial opacities.  IMPRESSION: No radiographic evidence of acute cardiopulmonary disease.  Defibrillator pads.  Atherosclerosis.  Signed,  Dulcy Fanny. Earleen Newport, DO  [facility]  [facility]   Electronically Signed   By: Corrie Mckusick D.O.   On: 05/08/2015 14:19    TELE NSR, very frequent PACs, very brief SVT. No true atrial fibrillation. No bradycardia/   ASSESSMENT AND PLAN  Not sure what caused her renal failure and severe hyperkalemia (inadvertent overdose of meds? Reduced PO intake?), but her arrhythmia resolved as soon as metabolic imbalances were corrected. Restart diltiazem for her frequent PACs, brief atrial tachycardia and history of atrial fibrillation. Will use a lower dose. Will not restart beta blocker. At this point it is probably OK to resume ACEi and HCTZ as needed for BP.   Sanda Klein, MD, [facility] [phone] office [phone] pager 05/10/2015 11:31 AM

## 2015-05-10 NOTE — Progress Notes (Signed)
[facility] Progress Note Patient Name: [name] DOB: January 22, 1922 MRN: [id number]   Date of Service  05/10/2015  HPI/Events of Note   Recent Labs Lab 05/08/15 1445  05/08/15 1935 05/08/15 2220 05/09/15 0452 05/10/15 0336  NA 135  --  137 137 141 139  K 6.9*  < > 5.0 5.0 4.5 4.2  CL 107  --  107 104 107 107  CO2 21*  --  21* 20* 21* 24  GLUCOSE 87  --  117* 158* 158* 78  BUN 49*  --  46* 41* 44* 27*  CREATININE 1.91*  --  1.60* 1.56* 1.54* 0.90  CALCIUM 8.6*  --  8.9 8.5* 9.1 9.4  MG  --   --   --   --   --  1.3*  PHOS  --   --   --   --   --  2.4*  < > = values in this interval not displayed.   eICU Interventions  Low mag Mild low phos  Plan 4gm mag sulfate 10 mmol na-phos     Intervention Category Major Interventions: Electrolyte abnormality - evaluation and management  Jaquasha Carnevale 05/10/2015, 4:41 AM

## 2015-05-10 NOTE — Progress Notes (Signed)
[facility] PROGRESS NOTE  [name] XHB:[id number] DOB: [birth year] DOA: 05/08/2015 PCP: Horatio Pel, MD  Assessment/Plan: 1. Severe bradycardia with junctional rhythm 1. Likely secondary to profound hyperkalemia 2. HR stable, no longer bradycardic since correcting K and renal function 3. Cont to monitor on tele 2. Hyperkalemia 1. Normalized 2. Will cont to monitor 3. ARF 1. Renal function normalized with hydration 2. Cont to follow 4. HTN 1. BP suboptimal 2. Cardizem started on 9/20, Cardiology recs to avoid beta blocker 3. Will cont to monitor and if BP still suboptimal, would resume lisinopril 4. Continuing to hold HCTZ given presenting ARF and concerns of dehydration 5. Afib 1. CHADS-VASc of 4 2. Rate controlled currently 3. Cardiology has started cardizem on 9/20, avoid beta blocker 4. Pt normally on coumadin, unclear why was not resumed on admit. No obvious contraindications for anticoagulation at this time, thus will consult pharmacy for coumadin dosing 6. Chronic iron deficiency anemia 1. Resume iron supplementation 2. Cont on scheduled colace to prevent constipation 7. HLD 1. Resume statin and fibrate 8. DVT prophylaxis 1. Coumadin per above  Code Status: DNR Family Communication: Pt in room, daughter at bedside Disposition Plan: Transfer to med-tele   Consultants:  Cardiology  Critical Care  Procedures:    Antibiotics:   (indicate start date, and stop date if known)  HPI/Subjective: Feels much better today  Objective: Filed Vitals:   05/10/15 0900 05/10/15 1200 05/10/15 1317 05/10/15 1653  BP: 154/83  175/63 175/67  Pulse: 78   80  Temp: 98.2 F (36.8 C) 97.5 F (36.4 C)  98.6 F (37 C)  TempSrc: Oral Oral  Oral  Resp: 24   19  Height:      Weight:      SpO2: 100%   98%    Intake/Output Summary (Last 24 hours) at 05/10/15 1730 Last data filed at 05/10/15 0900  Gross per 24 hour  Intake    495 ml  Output    1255 ml  Net   -760 ml   Filed Weights   05/08/15 2125 05/09/15 0500 05/10/15 0600  Weight: 61.7 kg (136 lb 0.4 oz) 61.7 kg (136 lb 0.4 oz) 58.8 kg (129 lb 10.1 oz)    Exam:   General:  Awake, in nad  Cardiovascular: regular, s1, s2  Respiratory: normal resp effort, no wheezing  Abdomen: soft, nondistended  Musculoskeletal: perfused, no clubbing   Data Reviewed: Basic Metabolic Panel:  Recent Labs Lab 05/08/15 1445 05/08/15 1713 05/08/15 1935 05/08/15 2220 05/09/15 0452 05/10/15 0336  NA 135  --  137 137 141 139  K 6.9* 5.6* 5.0 5.0 4.5 4.2  CL 107  --  107 104 107 107  CO2 21*  --  21* 20* 21* 24  GLUCOSE 87  --  117* 158* 158* 78  BUN 49*  --  46* 41* 44* 27*  CREATININE 1.91*  --  1.60* 1.56* 1.54* 0.90  CALCIUM 8.6*  --  8.9 8.5* 9.1 9.4  MG  --   --   --   --   --  1.3*  PHOS  --   --   --   --   --  2.4*   Liver Function Tests:  Recent Labs Lab 05/08/15 1445  AST 30  ALT 19  ALKPHOS 27*  BILITOT 0.4  PROT 6.1*  ALBUMIN 3.4*   No results for input(s): LIPASE, AMYLASE in the last 168 hours. No results for input(s): AMMONIA in the last  168 hours. CBC:  Recent Labs Lab 05/08/15 1445 05/08/15 1935 05/09/15 0452 05/10/15 0336  WBC 8.0 10.2 11.2* 6.8  NEUTROABS 5.6  --   --   --   HGB 11.2* 11.9* 11.6* 11.2*  HCT 34.3* 36.0 34.4* 33.3*  MCV 91.7 91.8 90.1 88.8  PLT 211 223 244 230   Cardiac Enzymes:  Recent Labs Lab 05/08/15 1445 05/08/15 1713 05/08/15 2220 05/09/15 0452  TROPONINI <0.03 <0.03 <0.03 0.04*   BNP (last 3 results)  Recent Labs  05/08/15 1445  BNP 511.1*    ProBNP (last 3 results) No results for input(s): PROBNP in the last 8760 hours.  CBG:  Recent Labs Lab 05/08/15 1654 05/08/15 1750 05/08/15 1940 05/09/15 1152  GLUCAP 231* 85 98 82    Recent Results (from the past 240 hour(s))  MRSA PCR Screening     Status: None   Collection Time: 05/08/15  9:31 PM  Result Value Ref Range Status   MRSA by PCR  NEGATIVE NEGATIVE Final    Comment:        The GeneXpert MRSA Assay (FDA approved for NASAL specimens only), is one component of a comprehensive MRSA colonization surveillance program. It is not intended to diagnose MRSA infection nor to guide or monitor treatment for MRSA infections.   Urine culture     Status: None (Preliminary result)   Collection Time: 05/09/15 10:38 AM  Result Value Ref Range Status   Specimen Description URINE, CLEAN CATCH  Final   Special Requests NONE  Final   Culture 10,000 COLONIES/mL ESCHERICHIA COLI  Final   Report Status PENDING  Incomplete     Studies: Ct Head Wo Contrast  05/08/2015   CLINICAL DATA:  81 year old with altered mental status. Initial encounter.  EXAM: CT HEAD WITHOUT CONTRAST  TECHNIQUE: Contiguous axial images were obtained from the base of the skull through the vertex without intravenous contrast.  COMPARISON:  MRI brain 07/17/2014-no report.  FINDINGS: There is no evidence of acute intracranial hemorrhage, mass lesion, brain edema or extra-axial fluid collection. The ventricles and subarachnoid spaces are prominent but stable. There is no CT evidence of acute cortical infarction. There is stable minimal chronic periventricular white matter disease. Intracranial vascular calcifications are present.  There is chronic sinus disease with near-complete opacification of the left maxillary, ethmoid and frontal sinuses. There is mucosal thickening and probable fluid in the right maxillary sinus. There is a small amount of fluid in the mastoid air cells on the right. The calvarium is intact.  IMPRESSION: 1. No acute intracranial findings. 2. Chronic cerebral atrophy. 3. Chronic paranasal sinus inflammatory changes, slightly worse from previous MRI.   Electronically Signed   By: Richardean Sale M.D.   On: 05/08/2015 18:28    Scheduled Meds: . diltiazem  60 mg Oral Q12H  . feeding supplement (ENSURE ENLIVE)  237 mL Oral BID BM  . gabapentin  300 mg  Oral TID  . pantoprazole  40 mg Oral Daily   Continuous Infusions: . DOPamine Stopped (05/09/15 0445)  . glucagon (GLUCAGEN) infusion (for beta blocker/calcium channel blocker overdose) Stopped (05/09/15 0908)    Principal Problem:   Bradycardia Active Problems:   Moderate mitral regurgitation   PAF (paroxysmal atrial fibrillation)   Hyperkalemia   (HFpEF) heart failure with preserved ejection fraction   Chest pain   ARF (acute renal failure)   Renal failure   [facility], [facility] Pager [phone]. If 7PM-7AM, please contact night-coverage at [email or web address], password [facility]  05/10/2015, 5:30 PM  LOS: 2 days

## 2015-05-11 DIAGNOSIS — I509 Heart failure, unspecified: Secondary | ICD-10-CM

## 2015-05-11 DIAGNOSIS — R001 Bradycardia, unspecified: Secondary | ICD-10-CM

## 2015-05-11 DIAGNOSIS — N179 Acute kidney failure, unspecified: Principal | ICD-10-CM

## 2015-05-11 LAB — URINE CULTURE: Culture: 10000

## 2015-05-11 LAB — PROTIME-INR
INR: 1.72 — ABNORMAL HIGH (ref 0.00–1.49)
Prothrombin Time: 20.2 seconds — ABNORMAL HIGH (ref 11.6–15.2)

## 2015-05-11 MED ORDER — LOPERAMIDE HCL 2 MG PO CAPS
2.0000 mg | ORAL_CAPSULE | ORAL | Status: DC | PRN
Start: 2015-05-11 — End: 2015-05-12
  Administered 2015-05-11: 2 mg via ORAL
  Filled 2015-05-11: qty 1

## 2015-05-11 MED ORDER — WARFARIN SODIUM 4 MG PO TABS
4.0000 mg | ORAL_TABLET | Freq: Once | ORAL | Status: AC
  Administered 2015-05-11: 4 mg via ORAL
  Filled 2015-05-11: qty 1

## 2015-05-11 MED ORDER — CEPHALEXIN 250 MG PO CAPS
250.0000 mg | ORAL_CAPSULE | Freq: Three times a day (TID) | ORAL | Status: DC
  Administered 2015-05-11 – 2015-05-12 (×4): 250 mg via ORAL
  Filled 2015-05-11 (×7): qty 1

## 2015-05-11 NOTE — Progress Notes (Signed)
Patient Name: [name] Date of Encounter: 05/11/2015  Cardiologist: Dr. Shelva Majestic    Principal Problem:   Bradycardia Active Problems:   Moderate mitral regurgitation   PAF (paroxysmal atrial fibrillation)   Hyperkalemia   (HFpEF) heart failure with preserved ejection fraction   Chest pain   ARF (acute renal failure)   Renal failure   Acute pulmonary edema    SUBJECTIVE  Denies any discomfort. Very pleasant lady, asked a lot of questions. No SOB. Son at bedside  [place] . atorvastatin  10 mg Oral Daily  . benazepril  40 mg Oral Daily  . cycloSPORINE  1 drop Both Eyes Daily  . diltiazem  60 mg Oral Q12H  . docusate sodium  100 mg Oral BID  . feeding supplement (ENSURE ENLIVE)  237 mL Oral BID BM  . fenofibrate  160 mg Oral Daily  . ferrous sulfate  325 mg Oral Q breakfast  . gabapentin  300 mg Oral TID  . hydrochlorothiazide  25 mg Oral Daily  . pantoprazole  40 mg Oral Daily  . Warfarin - Pharmacist Dosing Inpatient   Does not apply q1800    OBJECTIVE  Filed Vitals:   05/10/15 1802 05/10/15 1910 05/10/15 1957 05/11/15 0437  BP: 180/71 160/66 172/80 142/59  Pulse: 88  86 90  Temp:   98.4 F (36.9 C) 97.8 F (36.6 C)  TempSrc:   Oral Oral  Resp:   18 18  Height:      Weight:    126 lb (57.153 kg)  SpO2:   100% 98%    Intake/Output Summary (Last 24 hours) at 05/11/15 0846 Last data filed at 05/11/15 7654  Gross per 24 hour  Intake    120 ml  Output   1225 ml  Net  -1105 ml   Filed Weights   05/09/15 0500 05/10/15 0600 05/11/15 0437  Weight: 136 lb 0.4 oz (61.7 kg) 129 lb 10.1 oz (58.8 kg) 126 lb (57.153 kg)    PHYSICAL EXAM  General: Pleasant, NAD. Neuro: Alert and oriented X 3. Moves all extremities spontaneously. Psych: Normal affect. HEENT:  Normal  Neck: Supple without bruits or JVD. Lungs:  Resp regular and unlabored, CTA. Heart: RRR no s3, s4. 3/6 systolic murmur Abdomen: Soft, non-distended, BS + x 4. Diffusely  tender Extremities: No clubbing, cyanosis or edema. DP/PT/Radials 2+ and equal bilaterally.  Accessory Clinical Findings  CBC  Recent Labs  05/08/15 1445  05/09/15 0452 05/10/15 0336  WBC 8.0  < > 11.2* 6.8  NEUTROABS 5.6  --   --   --   HGB 11.2*  < > 11.6* 11.2*  HCT 34.3*  < > 34.4* 33.3*  MCV 91.7  < > 90.1 88.8  PLT 211  < > 244 230  < > = values in this interval not displayed. Basic Metabolic Panel  Recent Labs  05/09/15 0452 05/10/15 0336  NA 141 139  K 4.5 4.2  CL 107 107  CO2 21* 24  GLUCOSE 158* 78  BUN 44* 27*  CREATININE 1.54* 0.90  CALCIUM 9.1 9.4  MG  --  1.3*  PHOS  --  2.4*   Liver Function Tests  Recent Labs  05/08/15 1445  AST 30  ALT 19  ALKPHOS 27*  BILITOT 0.4  PROT 6.1*  ALBUMIN 3.4*   Cardiac Enzymes  Recent Labs  05/08/15 1713 05/08/15 2220 05/09/15 0452  TROPONINI <0.03 <0.03 0.04*    TELE NSR with HR  60-70s    ECG  No new EKG  Echocardiogram 05/09/2015  LV EF: 65% -  70%  ------------------------------------------------------------------- Indications:   CHF - 428.0.  ------------------------------------------------------------------- History:  PMH:  Coronary artery disease. Risk factors: Hypertension. Dyslipidemia.  ------------------------------------------------------------------- Study Conclusions  - Left ventricle: The cavity size was normal. Wall thickness was increased in a pattern of mild LVH. Systolic function was vigorous. The estimated ejection fraction was in the range of 65% to 70%. Wall motion was normal; there were no regional wall motion abnormalities. Doppler parameters are consistent with abnormal left ventricular relaxation (grade 1 diastolic dysfunction). Doppler parameters are consistent with high ventricular filling pressure. - Aortic valve: Valve mobility was restricted. There was mild stenosis. There was trivial regurgitation. - Mitral valve: Severely  calcified annulus. Mildly thickened leaflets . The findings are consistent with mild stenosis. There was moderate regurgitation. Valve area by pressure half-time: 1.84 cm^2. - Left atrium: The atrium was moderately dilated. - Tricuspid valve: There was moderate regurgitation. - Pulmonary arteries: Systolic pressure was severely increased.  Impressions:  - Hyperdynamic LV function; grade 1 diastolic dysfunction with elevated LV filling pressure; moderate LAE; mild AS; trace AI; moderate MR, mild MS; moderate TR; severely elevated pulmonary pressure.     Radiology/Studies  Ct Head Wo Contrast  05/08/2015   CLINICAL DATA:  81 year old with altered mental status. Initial encounter.  EXAM: CT HEAD WITHOUT CONTRAST  TECHNIQUE: Contiguous axial images were obtained from the base of the skull through the vertex without intravenous contrast.  COMPARISON:  MRI brain 07/17/2014-no report.  FINDINGS: There is no evidence of acute intracranial hemorrhage, mass lesion, brain edema or extra-axial fluid collection. The ventricles and subarachnoid spaces are prominent but stable. There is no CT evidence of acute cortical infarction. There is stable minimal chronic periventricular white matter disease. Intracranial vascular calcifications are present.  There is chronic sinus disease with near-complete opacification of the left maxillary, ethmoid and frontal sinuses. There is mucosal thickening and probable fluid in the right maxillary sinus. There is a small amount of fluid in the mastoid air cells on the right. The calvarium is intact.  IMPRESSION: 1. No acute intracranial findings. 2. Chronic cerebral atrophy. 3. Chronic paranasal sinus inflammatory changes, slightly worse from previous MRI.   Electronically Signed   By: Richardean Sale M.D.   On: 05/08/2015 18:28   Dg Chest Portable 1 View  05/08/2015   CLINICAL DATA:  81 year old female with a history of pulmonary edema. History arterial disease  and cardiac disease.  EXAM: PORTABLE CHEST - 1 VIEW  COMPARISON:  None.  FINDINGS: Cardiomediastinal silhouette unchanged in size and contour. Mediastinum partially obscured by overlying defibrillator pads.  Calcifications aortic arch.  No evidence of pulmonary vascular congestion.  Low lung volumes.  No confluent airspace disease, pneumothorax, or pleural effusion. Persistent interstitial opacities.  IMPRESSION: No radiographic evidence of acute cardiopulmonary disease.  Defibrillator pads.  Atherosclerosis.  Signed,  Dulcy Fanny. Earleen Newport, DO  [facility]  [facility]   Electronically Signed   By: Corrie Mckusick D.O.   On: 05/08/2015 14:19    ASSESSMENT AND PLAN  SHAYA REDDICK is a 81 y.o. female with a hx of valvular heart disease, HTN, HL, pulmonary hypertension presented with weakness and near syncope noted to have significant bradycardia with junctional rhythm and hypotension (systolic 33I). Her K was 6.9 and Cr 1.91 (baseline 0.75).  1. Bradycardia in the setting of hyperkalemia and AKI  - resolved after normalization  of electrolyte.   - Echo 05/09/2015 EF 65-70%, no RWMA, grade 1 diastolic dysfunction, mild MS, moderate MR, moderate TR, severely elevated PASP  - diltiazem and BB held on arrival, low dose diltiazem restarted for frequent PACs, will not restart BB. Given AKI, her ACEI and HCTZ was also held, now back on both.   - no further cardiac test expected.   2. AMS  - urine culture grew E Coli. UA negative for nitrite.   3. Hyperkalemia: improved.  4. Acute renal insufficiency: improved with hydration  5. Paroxysmal atrial fibrillation CHADS-VASc of 4 on coumadin  6. Moderate MR  7. Chest pain with normal cath in Jan 2016  8. H/o PAF on coumadin  9. Abdominal pain with chronic diarrhea for years: per son, seen GI before, was told to use colestipol. Did not see any c-diff workup. Still has diffusely tender abdomen, will defer to IM.  Prone to dehydration.  Hilbert Corrigan PA-C Pager: [phone]  I have seen and examined the patient along with Almyra Deforest PA-C.  I have reviewed the chart, notes and new data.  I agree with PA's note.  Key new complaints: no cardiac complaints Key examination changes: no signs of CHF and no meaningful arrhythmia Key new findings / data: positive UCx  PLAN: BP starting to trend upwards, but only just started her diltiazem again. Increase diltiazem dose to home dosage and/or add HCTZ back if still high tomorrow. Continue to avoid beta blocker and ACEi for now due to presentation with hyperkalemia and junctional rhythm.   Sanda Klein, MD, [place] [phone] 05/11/2015, 10:11 AM

## 2015-05-11 NOTE — Care Management Important Message (Signed)
Important Message  Patient Details  Name: [name] MRN: [id number] Date of Birth: 07/30/1922   Medicare Important Message Given:  Yes-second notification given    Loann Quill 05/11/2015, 10:57 AM

## 2015-05-11 NOTE — Progress Notes (Addendum)
[facility] PROGRESS NOTE  [name] NWG:[id number] DOB: [birth year] DOA: 05/08/2015 PCP: Horatio Pel, MD  Assessment/Plan: 1. Severe bradycardia with junctional rhythm -secondary to profound hyperkalemia -HR stable, no longer bradycardic since correcting K and renal function -back on Cardizem now, BB on hold, ACE stopped  2. Hyperkalemia -treated and normalized  3. ARF -Renal function normalized with hydration, ACE stopped  4. HTN -Cardizem started on 9/20, Cardiology recs to avoid beta blocker -may need alternate agent if stays elevated today -Continuing to hold HCTZ given presenting ARF and concerns of dehydration  5. Afib -CHADS-VASc of 4 -Rate controlled currently,  -restarted cardizem on 9/20, avoid beta blocker -back on coumadin  6. Chronic iron deficiency anemia -Resume iron supplementation  7. Ecoli UTI-       -start Keflex  8. Chronic diarrhea      -had multiple colonoscopies, no etiology found      -takes Cholestipol PRN, stop Colace, add imodium PRN  DVT prophylaxis -on Coumadin  Code Status: DNR Family Communication: son at bedside Disposition Plan:Home tomorrow if stable   Consultants:  Cardiology  Critical Care   HPI/Subjective: Feels much better today, chronic diarrhea  Objective: Filed Vitals:   05/10/15 1910 05/10/15 1957 05/11/15 0437 05/11/15 1243  BP: 160/66 172/80 142/59 177/67  Pulse:  86 90 80  Temp:  98.4 F (36.9 C) 97.8 F (36.6 C) 98 F (36.7 C)  TempSrc:  Oral Oral Oral  Resp:  18 18 18   Height:      Weight:   57.153 kg (126 lb)   SpO2:  100% 98% 97%    Intake/Output Summary (Last 24 hours) at 05/11/15 1341 Last data filed at 05/11/15 1244  Gross per 24 hour  Intake    240 ml  Output   1025 ml  Net   -785 ml   Filed Weights   05/09/15 0500 05/10/15 0600 05/11/15 0437  Weight: 61.7 kg (136 lb 0.4 oz) 58.8 kg (129 lb 10.1 oz) 57.153 kg (126 lb)    Exam:   General:  Awake, in  nad  Cardiovascular: regular, s1, s2  Respiratory: normal resp effort, no wheezing  Abdomen: soft, NT, mildly distended  Musculoskeletal: perfused, no clubbing   Data Reviewed: Basic Metabolic Panel:  Recent Labs Lab 05/08/15 1445 05/08/15 1713 05/08/15 1935 05/08/15 2220 05/09/15 0452 05/10/15 0336  NA 135  --  137 137 141 139  K 6.9* 5.6* 5.0 5.0 4.5 4.2  CL 107  --  107 104 107 107  CO2 21*  --  21* 20* 21* 24  GLUCOSE 87  --  117* 158* 158* 78  BUN 49*  --  46* 41* 44* 27*  CREATININE 1.91*  --  1.60* 1.56* 1.54* 0.90  CALCIUM 8.6*  --  8.9 8.5* 9.1 9.4  MG  --   --   --   --   --  1.3*  PHOS  --   --   --   --   --  2.4*   Liver Function Tests:  Recent Labs Lab 05/08/15 1445  AST 30  ALT 19  ALKPHOS 27*  BILITOT 0.4  PROT 6.1*  ALBUMIN 3.4*   No results for input(s): LIPASE, AMYLASE in the last 168 hours. No results for input(s): AMMONIA in the last 168 hours. CBC:  Recent Labs Lab 05/08/15 1445 05/08/15 1935 05/09/15 0452 05/10/15 0336  WBC 8.0 10.2 11.2* 6.8  NEUTROABS 5.6  --   --   --  HGB 11.2* 11.9* 11.6* 11.2*  HCT 34.3* 36.0 34.4* 33.3*  MCV 91.7 91.8 90.1 88.8  PLT 211 223 244 230   Cardiac Enzymes:  Recent Labs Lab 05/08/15 1445 05/08/15 1713 05/08/15 2220 05/09/15 0452  TROPONINI <0.03 <0.03 <0.03 0.04*   BNP (last 3 results)  Recent Labs  05/08/15 1445  BNP 511.1*    ProBNP (last 3 results) No results for input(s): PROBNP in the last 8760 hours.  CBG:  Recent Labs Lab 05/08/15 1654 05/08/15 1750 05/08/15 1940 05/09/15 1152  GLUCAP 231* 85 98 82    Recent Results (from the past 240 hour(s))  MRSA PCR Screening     Status: None   Collection Time: 05/08/15  9:31 PM  Result Value Ref Range Status   MRSA by PCR NEGATIVE NEGATIVE Final    Comment:        The GeneXpert MRSA Assay (FDA approved for NASAL specimens only), is one component of a comprehensive MRSA colonization surveillance program. It is  not intended to diagnose MRSA infection nor to guide or monitor treatment for MRSA infections.   Urine culture     Status: None   Collection Time: 05/09/15 10:38 AM  Result Value Ref Range Status   Specimen Description URINE, CLEAN CATCH  Final   Special Requests NONE  Final   Culture 10,000 COLONIES/mL ESCHERICHIA COLI  Final   Report Status 05/11/2015 FINAL  Final   Organism ID, Bacteria ESCHERICHIA COLI  Final      Susceptibility   Escherichia coli - MIC*    AMPICILLIN <=2 SENSITIVE Sensitive     CEFAZOLIN <=4 SENSITIVE Sensitive     CEFTRIAXONE <=1 SENSITIVE Sensitive     CIPROFLOXACIN <=0.25 SENSITIVE Sensitive     GENTAMICIN <=1 SENSITIVE Sensitive     IMIPENEM <=0.25 SENSITIVE Sensitive     NITROFURANTOIN <=16 SENSITIVE Sensitive     TRIMETH/SULFA <=20 SENSITIVE Sensitive     AMPICILLIN/SULBACTAM <=2 SENSITIVE Sensitive     PIP/TAZO <=4 SENSITIVE Sensitive     * 10,000 COLONIES/mL ESCHERICHIA COLI     Studies: No results found.  Scheduled Meds: . atorvastatin  10 mg Oral Daily  . benazepril  40 mg Oral Daily  . cephALEXin  250 mg Oral 3 times per day  . cycloSPORINE  1 drop Both Eyes Daily  . diltiazem  60 mg Oral Q12H  . ferrous sulfate  325 mg Oral Q breakfast  . gabapentin  300 mg Oral TID  . hydrochlorothiazide  25 mg Oral Daily  . pantoprazole  40 mg Oral Daily  . warfarin  4 mg Oral ONCE-1800  . Warfarin - Pharmacist Dosing Inpatient   Does not apply q1800   Continuous Infusions: . glucagon (GLUCAGEN) infusion (for beta blocker/calcium channel blocker overdose) Stopped (05/09/15 0908)    Principal Problem:   Bradycardia Active Problems:   Moderate mitral regurgitation   PAF (paroxysmal atrial fibrillation)   Hyperkalemia   (HFpEF) heart failure with preserved ejection fraction   Chest pain   ARF (acute renal failure)   Renal failure   Acute pulmonary edema   [facility]  [facility] Pager [phone]. If 7PM-7AM, please contact  night-coverage at [email or web address], password [facility] 05/11/2015, 1:41 PM  LOS: 3 days

## 2015-05-11 NOTE — Progress Notes (Signed)
[place] for warfarin Indication: atrial fibrillation  Allergies  Allergen Reactions  . Avelox [Moxifloxacin Hcl In Nacl] Shortness Of Breath and Swelling  . Aricept [Donepezil Hcl] Diarrhea  . Codeine Swelling       . Garlic Other (See Comments)    Gi problems  . Latex Hives, Itching, Rash and Other (See Comments)    Watery blisters   . Onion Other (See Comments)    Gi problems  . Sulfa Drugs Cross Reactors Swelling  . Polysporin [Bacitracin-Polymyxin B] Other (See Comments)    Patient Measurements: Height: 5\' 3"  (160 cm) Weight: 126 lb (57.153 kg) IBW/kg (Calculated) : 52.4  Vital Signs: Temp: 97.8 F (36.6 C) (09/21 0437) Temp Source: Oral (09/21 0437) BP: 142/59 mmHg (09/21 0437) Pulse Rate: 90 (09/21 0437)  Labs:  Recent Labs  05/08/15 1713 05/08/15 1935 05/08/15 2220 05/09/15 0452 05/09/15 1140 05/10/15 0336 05/10/15 1820 05/11/15 0348  HGB  --  11.9*  --  11.6*  --  11.2*  --   --   HCT  --  36.0  --  34.4*  --  33.3*  --   --   PLT  --  223  --  244  --  230  --   --   LABPROT 42.1*  --   --   --  32.9*  --  22.0* 20.2*  INR 4.58*  --   --   --  3.30*  --  1.93* 1.72*  CREATININE  --  1.60* 1.56* 1.54*  --  0.90  --   --   TROPONINI <0.03  --  <0.03 0.04*  --   --   --   --     Estimated Creatinine Clearance: 34.4 mL/min (by C-G formula based on Cr of 0.9).   Medical History: Past Medical History  Diagnosis Date  . Hypertension   . Hyperlipidemia   . GERD (gastroesophageal reflux disease)   . Anxiety   . Mitral prolapse   . Degenerative arthritis   . Neuropathy   . Peripheral neuropathy     Small fiber   . Diverticulosis   . Mild renal insufficiency   . Hx of echocardiogram 07/30/2012    EF 55-60%; mild LVH; mild AV stenosis & trivial regurg.; mod MV regurg.; LA severly dilated; RV systolic pressure increased consistent with severe pulm. hypertension; RA moderately dilated; mod TV regurg.;   .  History of nuclear stress test 09/19/2007    normal pattern of perfusion; post-stress EF 86%; EKG negative for ischemia; low risk scan  . Carotid arterial disease 07/30/2012    carotid doppler; normal study  . Foot fracture, left   . Memory disorder 03/05/2014  . Chronic anticoagulation, with coumadin, with PAF and CHADS2Vasc2 score of 4 09/16/2014  . S/P cardiac cath 09/14/14 mild calcification of ostial RCA but otherwise normal coronary arteries 09/14/2014  . Anemia, iron deficiency 09/16/2014     Assessment: 22 YOF admitted with symptomatic bradycardia. PTA she is on warfarin (home dose: 3mg  daily except 4mg  on MWF, last dose 9/17) for AFib (CHADSVASC = 4). Her INR was elevated on admission at 4.58 and warfarin has been held. She has NOT received vitamin K.  Warfarin resumed 9/20. Now INR 1.93>>1.72 with warfarin restarted 9/20. Hg 11.2, plt wnl (no new CBC). No bleed documented   Goal of Therapy:  INR 2-3 Monitor platelets by anticoagulation protocol: Yes   Plan:  -warfarin 4mg  po x1 tonight -daily  INR -follow for s/s bleeding  [name], PharmD Clinical Pharmacist Pager [phone] 05/11/2015 10:51 AM

## 2015-05-12 DIAGNOSIS — J810 Acute pulmonary edema: Secondary | ICD-10-CM

## 2015-05-12 DIAGNOSIS — N171 Acute kidney failure with acute cortical necrosis: Secondary | ICD-10-CM

## 2015-05-12 LAB — CBC
HCT: 30.5 % — ABNORMAL LOW (ref 36.0–46.0)
Hemoglobin: 10.4 g/dL — ABNORMAL LOW (ref 12.0–15.0)
MCH: 29.5 pg (ref 26.0–34.0)
MCHC: 34.1 g/dL (ref 30.0–36.0)
MCV: 86.4 fL (ref 78.0–100.0)
Platelets: 225 10*3/uL (ref 150–400)
RBC: 3.53 MIL/uL — ABNORMAL LOW (ref 3.87–5.11)
RDW: 12.9 % (ref 11.5–15.5)
WBC: 5.6 10*3/uL (ref 4.0–10.5)

## 2015-05-12 LAB — BASIC METABOLIC PANEL
Anion gap: 6 (ref 5–15)
BUN: 12 mg/dL (ref 6–20)
CO2: 26 mmol/L (ref 22–32)
Calcium: 9.1 mg/dL (ref 8.9–10.3)
Chloride: 103 mmol/L (ref 101–111)
Creatinine, Ser: 0.65 mg/dL (ref 0.44–1.00)
GFR calc Af Amer: >60 mL/min (ref >60)
GFR calc non Af Amer: >60 mL/min (ref >60)
Glucose, Bld: 73 mg/dL (ref 65–99)
Potassium: 3.1 mmol/L — ABNORMAL LOW (ref 3.5–5.1)
Sodium: 135 mmol/L (ref 135–145)

## 2015-05-12 LAB — PROTIME-INR
INR: 1.85 — ABNORMAL HIGH (ref 0.00–1.49)
Prothrombin Time: 21.3 seconds — ABNORMAL HIGH (ref 11.6–15.2)

## 2015-05-12 MED ORDER — POTASSIUM CHLORIDE CRYS ER 20 MEQ PO TBCR
40.0000 meq | EXTENDED_RELEASE_TABLET | Freq: Once | ORAL | Status: AC
  Administered 2015-05-12: 40 meq via ORAL
  Filled 2015-05-12: qty 2

## 2015-05-12 MED ORDER — HYDRALAZINE HCL 25 MG PO TABS: 25.0000 mg | ORAL_TABLET | Freq: Three times a day (TID) | ORAL | Status: DC

## 2015-05-12 MED ORDER — LOPERAMIDE HCL 2 MG PO CAPS: 2.0000 mg | ORAL_CAPSULE | Freq: Two times a day (BID) | ORAL | Status: DC | PRN

## 2015-05-12 MED ORDER — CEPHALEXIN 250 MG PO CAPS
250.0000 mg | ORAL_CAPSULE | Freq: Three times a day (TID) | ORAL | Status: DC
Start: 2015-05-12 — End: 2015-05-20

## 2015-05-12 MED ORDER — WARFARIN SODIUM 2 MG PO TABS: 3.0000 mg | ORAL_TABLET | Freq: Once | ORAL | Status: DC

## 2015-05-12 MED ORDER — DILTIAZEM HCL ER 60 MG PO CP12: 60.0000 mg | ORAL_CAPSULE | Freq: Two times a day (BID) | ORAL | Status: DC

## 2015-05-12 NOTE — Progress Notes (Signed)
[place] for warfarin Indication: atrial fibrillation  Allergies  Allergen Reactions  . Avelox [Moxifloxacin Hcl In Nacl] Shortness Of Breath and Swelling  . Aricept [Donepezil Hcl] Diarrhea  . Codeine Swelling       . Garlic Other (See Comments)    Gi problems  . Latex Hives, Itching, Rash and Other (See Comments)    Watery blisters   . Onion Other (See Comments)    Gi problems  . Sulfa Drugs Cross Reactors Swelling  . Polysporin [Bacitracin-Polymyxin B] Other (See Comments)    Patient Measurements: Height: 5\' 3"  (160 cm) Weight: 126 lb 9.6 oz (57.425 kg) IBW/kg (Calculated) : 52.4  Vital Signs: Temp: 97.9 F (36.6 C) (09/22 0552) Temp Source: Oral (09/22 0552) BP: 152/88 mmHg (09/22 0552) Pulse Rate: 61 (09/22 0552)  Labs:  Recent Labs  05/10/15 0336 05/10/15 1820 05/11/15 0348 05/12/15 0400  HGB 11.2*  --   --  10.4*  HCT 33.3*  --   --  30.5*  PLT 230  --   --  225  LABPROT  --  22.0* 20.2* 21.3*  INR  --  1.93* 1.72* 1.85*  CREATININE 0.90  --   --  0.65    Estimated Creatinine Clearance: 38.7 mL/min (by C-G formula based on Cr of 0.65).   Medical History: Past Medical History  Diagnosis Date  . Hypertension   . Hyperlipidemia   . GERD (gastroesophageal reflux disease)   . Anxiety   . Mitral prolapse   . Degenerative arthritis   . Neuropathy   . Peripheral neuropathy     Small fiber   . Diverticulosis   . Mild renal insufficiency   . Hx of echocardiogram 07/30/2012    EF 55-60%; mild LVH; mild AV stenosis & trivial regurg.; mod MV regurg.; LA severly dilated; RV systolic pressure increased consistent with severe pulm. hypertension; RA moderately dilated; mod TV regurg.;   . History of nuclear stress test 09/19/2007    normal pattern of perfusion; post-stress EF 86%; EKG negative for ischemia; low risk scan  . Carotid arterial disease 07/30/2012    carotid doppler; normal study  . Foot fracture, left    . Memory disorder 03/05/2014  . Chronic anticoagulation, with coumadin, with PAF and CHADS2Vasc2 score of 4 09/16/2014  . S/P cardiac cath 09/14/14 mild calcification of ostial RCA but otherwise normal coronary arteries 09/14/2014  . Anemia, iron deficiency 09/16/2014     Assessment: 71 YOF admitted with symptomatic bradycardia. PTA she is on warfarin (home dose: 3mg  daily except 4mg  on MWF, last dose 9/17) for AFib (CHADSVASC = 4). Her INR was elevated on admission at 4.58 and warfarin has been held. She has NOT received vitamin K.  Now INR back up to 1.72>>1.85 with warfarin restarted 9/20. Hg 10.4 plt wnl (no new CBC). No bleed documented. Also started on keflex today for Ecoli UTI (only 10k colonies).   Goal of Therapy:  INR 2-3 Monitor platelets by anticoagulation protocol: Yes   Plan:  - Warfarin 3mg  x 1 dose tonight - Daily INR, mon s/sx bleeding  Elicia Lamp, PharmD Clinical Pharmacist Pager [phone] 05/12/2015 10:05 AM

## 2015-05-12 NOTE — Discharge Summary (Signed)
Physician Discharge Summary  [name] EYC:[id number] DOB: 14-Jul-1922 DOA: 05/08/2015  PCP: Horatio Pel, MD  Admit date: 05/08/2015 Discharge date: 05/12/2015  Time spent: 45 minutes  Recommendations for Outpatient Follow-up:  1. Dr.Kelly 9/30, please check Bmet at FU  Discharge Diagnoses:  Principal Problem:   Bradycardia Active Problems:   Moderate mitral regurgitation   PAF (paroxysmal atrial fibrillation)   Hyperkalemia   (HFpEF) heart failure with preserved ejection fraction   Chest pain   ARF (acute renal failure)   Renal failure   Discharge Condition: stable  Diet recommendation: heart healthy  Filed Weights   05/10/15 0600 05/11/15 0437 05/12/15 0552  Weight: 58.8 kg (129 lb 10.1 oz) 57.153 kg (126 lb) 57.425 kg (126 lb 9.6 oz)    History of present illness:  CHIEF COMPLAINT: Weakness, near syncope INITIAL PRESENTATION: 81 year old woman with hypertension, coronary disease, atrial fibrillation admitted with weakness and found to have symptomatic bradycardia in the setting of acute renal failure and hyperkalemia.   Hospital Course:  1. Severe bradycardia with junctional rhythm -secondary to profound hyperkalemia noted on admission -HR stable, no longer bradycardic since correcting K and renal function -back on Cardizem now, BB on hold, ACE stopped  2. Hyperkalemia -treated and normalized, K low at discharge due to HCTZ, hence HCTZ stopped and changed to hydralazine  3. ARF     -due to dehydration and ACE -Renal function normalized with hydration, ACE stopped  4. HTN -Cardizem started on 9/20, Cardiology recs to avoid beta blocker, ACE stopped due to AKI/Hyperkalemia -HCTZ was restarted and then stopped due to hypokalemia, switched to Hydralazine 25mg  TID  5. Afib -CHADS-VASc of 4 -Rate controlled currently,  -restarted cardizem on 9/20, dose now 60mg  Q12, from home dose of 90mg  Q12, stopped beta blocker due to #1 -coumadin  resumed, INR 1.85 at discharge  6. Chronic iron deficiency anemia -Resumed iron supplementation  7. Ecoli UTI-  -started on Keflex 9/21, continue for 4 more days  8. Chronic diarrhea  -had multiple colonoscopies, no etiology found  -takes Cholestipol PRN, added imodium PRN which has helped her   Consultations:  Cardiology  Discharge Exam: Filed Vitals:   05/12/15 0552  BP: 152/88  Pulse: 61  Temp: 97.9 F (36.6 C)  Resp: 18    General: AAOx3 Cardiovascular: S1S2/RRR Respiratory: CTAB  Discharge Instructions   Discharge Instructions    Diet - low sodium heart healthy    Complete by:  As directed      Increase activity slowly    Complete by:  As directed           Current Discharge Medication List    START taking these medications   Details  cephALEXin (KEFLEX) 250 MG capsule Take 1 capsule (250 mg total) by mouth every 8 (eight) hours. For 4days Qty: 12 capsule, Refills: 0    hydrALAZINE (APRESOLINE) 25 MG tablet Take 1 tablet (25 mg total) by mouth 3 (three) times daily. Qty: 90 tablet, Refills: 0    loperamide (IMODIUM) 2 MG capsule Take 1 capsule (2 mg total) by mouth 2 (two) times daily as needed for diarrhea or loose stools. Qty: 30 capsule, Refills: 0      CONTINUE these medications which have CHANGED   Details  diltiazem (CARDIZEM SR) 60 MG 12 hr capsule Take 1 capsule (60 mg total) by mouth every 12 (twelve) hours. Qty: 60 capsule, Refills: 0      CONTINUE these medications which have NOT CHANGED  Details  atorvastatin (LIPITOR) 10 MG tablet Take 1 tablet (10 mg total) by mouth daily. Qty: 30 tablet, Refills: 6    Biotin 1 MG CAPS Take 1 capsule every day Qty: 30 capsule, Refills: 6    Calcium Carbonate-Vitamin D (CALTRATE 600+D) 600-400 MG-UNIT per tablet Take 1 tablet by mouth daily.     cetirizine (ZYRTEC) 10 MG tablet Take 10 mg by mouth daily.    cholecalciferol (VITAMIN D) 1000 UNITS tablet Take 1,000 Units by  mouth daily.      colestipol (COLESTID) 1 G tablet Take 1 g by mouth 2 (two) times daily as needed. For diarrhea     cycloSPORINE (RESTASIS) 0.05 % ophthalmic emulsion Place 1 drop into both eyes daily.      diphenhydrAMINE (BENADRYL) 25 mg capsule Take 25 mg by mouth at bedtime.      donepezil (ARICEPT) 5 MG tablet Take 5 mg by mouth at bedtime.     fenofibrate micronized (LOFIBRA) 134 MG capsule Take 1 capsule (134 mg total) by mouth daily. Qty: 90 capsule, Refills: 2    Ferrous Sulfate (IRON) 28 MG TABS Take 1 tablet (28 mg total) by mouth 2 (two) times daily. Qty: 60 each, Refills: 6    Flaxseed, Linseed, (FLAXSEED OIL) 1000 MG CAPS Take 1,000-2,000 mg by mouth 2 (two) times daily. 2 @ lunch and 1 @ dinner    flunisolide (NASALIDE) 0.025 % SOLN Inhale 1 spray into the lungs daily.      gabapentin (NEURONTIN) 300 MG capsule TAKE 1 CAPSULE THREE TIMES DAILY AND 3 CAPSULES AT BEDTIME Qty: 540 capsule, Refills: 1    HYDROcodone-acetaminophen (NORCO/VICODIN) 5-325 MG per tablet Take 1 tablet by mouth every 6 (six) hours.     hydroxypropyl methylcellulose (ISOPTO TEARS) 2.5 % ophthalmic solution Place 1 drop into both eyes as needed for dry eyes.    Lactobacillus (ACIDOPHILUS) 100 MG CAPS Take 100 mg by mouth daily.     lansoprazole (PREVACID) 15 MG capsule Take 15 mg by mouth daily.      Multiple Vitamin (MULTIVITAMIN) tablet Take 1 tablet by mouth daily.      Multiple Vitamins-Minerals (VISION FORMULA/LUTEIN) TABS Take 1 tablet by mouth daily.    pyridOXINE (VITAMIN B-6) 100 MG tablet Take 100 mg by mouth daily.    simethicone (MYLICON) 867 MG chewable tablet Chew 125 mg by mouth as needed for flatulence.    vitamin B-12 (CYANOCOBALAMIN) 1000 MCG tablet Take 1,000 mcg by mouth daily.    warfarin (COUMADIN) 2 MG tablet Take 1.5 to 2 tablets by mouth daily as directed by coumadin clinic Qty: 60 tablet, Refills: 0      STOP taking these medications     benazepril  (LOTENSIN) 40 MG tablet      carvedilol (COREG) 3.125 MG tablet      hydrochlorothiazide (HYDRODIURIL) 25 MG tablet        Allergies  Allergen Reactions  . Avelox [Moxifloxacin Hcl In Nacl] Shortness Of Breath and Swelling  . Aricept [Donepezil Hcl] Diarrhea  . Codeine Swelling       . Garlic Other (See Comments)    Gi problems  . Latex Hives, Itching, Rash and Other (See Comments)    Watery blisters   . Onion Other (See Comments)    Gi problems  . Sulfa Drugs Cross Reactors Swelling  . Polysporin [Bacitracin-Polymyxin B] Other (See Comments)   Follow-up Information    Follow up with Horatio Pel, MD In 1 week.  Specialty:  Internal Medicine   Contact information:   [address] [place] [address] [phone]       Follow up with bmet In 1 week.      Follow up with Troy Sine, MD On 05/20/2015.   Specialty:  Cardiology   Why:  @11 :30   Contact information:   [address] [place] [address] [phone]        The results of significant diagnostics from this hospitalization (including imaging, microbiology, ancillary and laboratory) are listed below for reference.    Significant Diagnostic Studies: Ct Head Wo Contrast  05/08/2015   CLINICAL DATA:  81 year old with altered mental status. Initial encounter.  EXAM: CT HEAD WITHOUT CONTRAST  TECHNIQUE: Contiguous axial images were obtained from the base of the skull through the vertex without intravenous contrast.  COMPARISON:  MRI brain 07/17/2014-no report.  FINDINGS: There is no evidence of acute intracranial hemorrhage, mass lesion, brain edema or extra-axial fluid collection. The ventricles and subarachnoid spaces are prominent but stable. There is no CT evidence of acute cortical infarction. There is stable minimal chronic periventricular white matter disease. Intracranial vascular calcifications are present.  There is chronic sinus disease with near-complete  opacification of the left maxillary, ethmoid and frontal sinuses. There is mucosal thickening and probable fluid in the right maxillary sinus. There is a small amount of fluid in the mastoid air cells on the right. The calvarium is intact.  IMPRESSION: 1. No acute intracranial findings. 2. Chronic cerebral atrophy. 3. Chronic paranasal sinus inflammatory changes, slightly worse from previous MRI.   Electronically Signed   By: Richardean Sale M.D.   On: 05/08/2015 18:28   Dg Chest Portable 1 View  05/08/2015   CLINICAL DATA:  81 year old female with a history of pulmonary edema. History arterial disease and cardiac disease.  EXAM: PORTABLE CHEST - 1 VIEW  COMPARISON:  None.  FINDINGS: Cardiomediastinal silhouette unchanged in size and contour. Mediastinum partially obscured by overlying defibrillator pads.  Calcifications aortic arch.  No evidence of pulmonary vascular congestion.  Low lung volumes.  No confluent airspace disease, pneumothorax, or pleural effusion. Persistent interstitial opacities.  IMPRESSION: No radiographic evidence of acute cardiopulmonary disease.  Defibrillator pads.  Atherosclerosis.  Signed,  Dulcy Fanny. Earleen Newport, DO  [facility]  [facility]   Electronically Signed   By: Corrie Mckusick D.O.   On: 05/08/2015 14:19    Microbiology: Recent Results (from the past 240 hour(s))  MRSA PCR Screening     Status: None   Collection Time: 05/08/15  9:31 PM  Result Value Ref Range Status   MRSA by PCR NEGATIVE NEGATIVE Final    Comment:        The GeneXpert MRSA Assay (FDA approved for NASAL specimens only), is one component of a comprehensive MRSA colonization surveillance program. It is not intended to diagnose MRSA infection nor to guide or monitor treatment for MRSA infections.   Urine culture     Status: None   Collection Time: 05/09/15 10:38 AM  Result Value Ref Range Status   Specimen Description URINE, CLEAN CATCH  Final    Special Requests NONE  Final   Culture 10,000 COLONIES/mL ESCHERICHIA COLI  Final   Report Status 05/11/2015 FINAL  Final   Organism ID, Bacteria ESCHERICHIA COLI  Final      Susceptibility   Escherichia coli - MIC*    AMPICILLIN <=2 SENSITIVE Sensitive     CEFAZOLIN <=4 SENSITIVE  Sensitive     CEFTRIAXONE <=1 SENSITIVE Sensitive     CIPROFLOXACIN <=0.25 SENSITIVE Sensitive     GENTAMICIN <=1 SENSITIVE Sensitive     IMIPENEM <=0.25 SENSITIVE Sensitive     NITROFURANTOIN <=16 SENSITIVE Sensitive     TRIMETH/SULFA <=20 SENSITIVE Sensitive     AMPICILLIN/SULBACTAM <=2 SENSITIVE Sensitive     PIP/TAZO <=4 SENSITIVE Sensitive     * 10,000 COLONIES/mL ESCHERICHIA COLI     Labs: Basic Metabolic Panel:  Recent Labs Lab 05/08/15 1935 05/08/15 2220 05/09/15 0452 05/10/15 0336 05/12/15 0400  NA 137 137 141 139 135  K 5.0 5.0 4.5 4.2 3.1*  CL 107 104 107 107 103  CO2 21* 20* 21* 24 26  GLUCOSE 117* 158* 158* 78 73  BUN 46* 41* 44* 27* 12  CREATININE 1.60* 1.56* 1.54* 0.90 0.65  CALCIUM 8.9 8.5* 9.1 9.4 9.1  MG  --   --   --  1.3*  --   PHOS  --   --   --  2.4*  --    Liver Function Tests:  Recent Labs Lab 05/08/15 1445  AST 30  ALT 19  ALKPHOS 27*  BILITOT 0.4  PROT 6.1*  ALBUMIN 3.4*   No results for input(s): LIPASE, AMYLASE in the last 168 hours. No results for input(s): AMMONIA in the last 168 hours. CBC:  Recent Labs Lab 05/08/15 1445 05/08/15 1935 05/09/15 0452 05/10/15 0336 05/12/15 0400  WBC 8.0 10.2 11.2* 6.8 5.6  NEUTROABS 5.6  --   --   --   --   HGB 11.2* 11.9* 11.6* 11.2* 10.4*  HCT 34.3* 36.0 34.4* 33.3* 30.5*  MCV 91.7 91.8 90.1 88.8 86.4  PLT 211 223 244 230 225   Cardiac Enzymes:  Recent Labs Lab 05/08/15 1445 05/08/15 1713 05/08/15 2220 05/09/15 0452  TROPONINI <0.03 <0.03 <0.03 0.04*   BNP: BNP (last 3 results)  Recent Labs  05/08/15 1445  BNP 511.1*    ProBNP (last 3 results) No results for input(s): PROBNP in the last  8760 hours.  CBG:  Recent Labs Lab 05/08/15 1654 05/08/15 1750 05/08/15 1940 05/09/15 1152  GLUCAP 231* 85 98 82       Signed:  JOSEPH,PREETHA  [facility] 05/12/2015, 1:44 PM

## 2015-05-12 NOTE — Evaluation (Signed)
Physical Therapy Evaluation Patient Details Name: [name] MRN: [id number] DOB: Nov 05, 1921 Today's Date: 05/12/2015   History of Present Illness  81 y.o. female admitted to [facility]: [place] [name] on 05/08/15 with weaknees and near syncope.  Pt dx with severe bradycardia, hyperkalemia, ARF, HTN, A-fib, iron deficiency anemia, e-coli UTI, and chronic diarrhea.  Pt with other significant PMHx of HTN, anxiety, mitral prolapse, neuropathy, L foot fx, and R TKA.  Clinical Impression  Pt is independent with mobility, mildly unsteady during gait, but able to self correct.  She is safe to return to her independent living apartment and her daughter will be staying with her for a few days at discharge.  No PT f/u recommended at this time.   Follow Up Recommendations No PT follow up    Equipment Recommendations  None recommended by PT    Recommendations for Other Services   NA    Precautions / Restrictions   None     Mobility  Bed Mobility Overal bed mobility: Independent                Transfers Overall transfer level: Independent Equipment used: None                Ambulation/Gait Ambulation/Gait assistance: Supervision Ambulation Distance (Feet): 250 Feet Assistive device: None Gait Pattern/deviations: Step-through pattern;Staggering left;Staggering right   Gait velocity interpretation: at or above normal speed for age/gender General Gait Details: mildly staggering gait pattern, but pt able to compensate and correct for small LOB. She reports she has not had any recent falls or stumbles at home.  I encouraged her to use an AD if she is not feeling well or noticies more stumbles than usual.           Balance Overall balance assessment: Needs assistance Sitting-balance support: No upper extremity supported Sitting balance-Leahy Scale: Good Sitting balance - Comments: pt able to donn both shoes and socks EOB without LOB   Standing balance support: No upper extremity  supported Standing balance-Leahy Scale: Good                               Pertinent Vitals/Pain Pain Assessment: No/denies pain    Home Living Family/patient expects to be discharged to:: Private residence Living Arrangements: Alone Available Help at Discharge: Family;Available 24 hours/day (daughter to stay with her for a few days at d/c) Type of Home: Independent living facility ([facility] [place]) Home Access: Level entry     Home Layout: One level Home Equipment: Environmental consultant - 4 wheels;Cane - single point      Prior Function Level of Independence: Independent         Comments: cooks two meals a day, drives her friends to MD appointments, shops, swims 2 times per week.         Extremity/Trunk Assessment   Upper Extremity Assessment: Overall WFL for tasks assessed           Lower Extremity Assessment: Overall WFL for tasks assessed      Cervical / Trunk Assessment: Kyphotic  Communication   Communication: No difficulties  Cognition Arousal/Alertness: Awake/alert Behavior During Therapy: WFL for tasks assessed/performed Overall Cognitive Status: Within Functional Limits for tasks assessed                               Assessment/Plan    PT Assessment Patent does not need any further  PT services  PT Diagnosis Difficulty walking;Abnormality of gait;Generalized weakness         PT Goals (Current goals can be found in the Care Plan section) Acute Rehab PT Goals Patient Stated Goal: to go home today if she can PT Goal Formulation: All assessment and education complete, DC therapy     End of Session   Activity Tolerance: Patient tolerated treatment well Patient left: in chair;with call bell/phone within reach Nurse Communication: Mobility status         Time: 7654-6503 PT Time Calculation (min) (ACUTE ONLY): 14 min   Charges:   PT Evaluation $Initial PT Evaluation Tier I: 1 Procedure          Dionna Wiedemann B. [place],  [place], DPT [phone]   05/12/2015, 12:07 PM

## 2015-05-12 NOTE — Care Management Note (Signed)
Case Management Note  Patient Details  Name: [name] MRN: [id number] Date of Birth: 28-Apr-1922  Subjective/Objective:  Pt is from [place]. Plan to return once stable.    Action/Plan:No further needs from CM at this time.      Expected Discharge Date:  05/11/15               Expected Discharge Plan:  Skilled Nursing Facility  In-House Referral:  Clinical Social Work  Discharge planning Services  CM Consult  Post Acute Care Choice:  NA Choice offered to:  NA  DME Arranged:  N/A DME Agency:  NA  HH Arranged:  NA HH Agency:  NA  Status of Service:     Medicare Important Message Given:  Yes-second notification given Date Medicare IM Given:    Medicare IM give by:    Date Additional Medicare IM Given:    Additional Medicare Important Message give by:     If discussed at [place] of Stay Meetings, dates discussed:    Additional Comments:  Bethena Roys, RN 05/12/2015, 1:22 PM

## 2015-05-20 ENCOUNTER — Ambulatory Visit (INDEPENDENT_AMBULATORY_CARE_PROVIDER_SITE_OTHER): Payer: Medicare Other | Admitting: Cardiovascular Disease

## 2015-05-20 ENCOUNTER — Encounter: Payer: Self-pay | Admitting: Cardiovascular Disease

## 2015-05-20 VITALS — BP 164/90 | HR 80 | Ht 63.0 in | Wt 123.6 lb

## 2015-05-20 DIAGNOSIS — R001 Bradycardia, unspecified: Secondary | ICD-10-CM | POA: Diagnosis not present

## 2015-05-20 DIAGNOSIS — R0609 Other forms of dyspnea: Secondary | ICD-10-CM | POA: Diagnosis not present

## 2015-05-20 DIAGNOSIS — Z7901 Long term (current) use of anticoagulants: Secondary | ICD-10-CM | POA: Diagnosis not present

## 2015-05-20 DIAGNOSIS — Z9889 Other specified postprocedural states: Secondary | ICD-10-CM

## 2015-05-20 DIAGNOSIS — Z79899 Other long term (current) drug therapy: Secondary | ICD-10-CM

## 2015-05-20 DIAGNOSIS — E785 Hyperlipidemia, unspecified: Secondary | ICD-10-CM | POA: Diagnosis not present

## 2015-05-20 DIAGNOSIS — I480 Paroxysmal atrial fibrillation: Secondary | ICD-10-CM

## 2015-05-20 DIAGNOSIS — I10 Essential (primary) hypertension: Secondary | ICD-10-CM

## 2015-05-20 DIAGNOSIS — Z5181 Encounter for therapeutic drug level monitoring: Secondary | ICD-10-CM

## 2015-05-20 DIAGNOSIS — R0600 Dyspnea, unspecified: Secondary | ICD-10-CM

## 2015-05-20 MED ORDER — WARFARIN SODIUM 2 MG PO TABS: ORAL_TABLET | ORAL | Status: DC

## 2015-05-20 MED ORDER — CARVEDILOL 3.125 MG PO TABS: ORAL_TABLET | ORAL | Status: DC

## 2015-05-20 MED ORDER — DILTIAZEM HCL ER 60 MG PO CP12: 60.0000 mg | ORAL_CAPSULE | Freq: Two times a day (BID) | ORAL | Status: DC

## 2015-05-20 MED ORDER — FENOFIBRATE MICRONIZED 134 MG PO CAPS: 134.0000 mg | ORAL_CAPSULE | Freq: Every day | ORAL | Status: DC

## 2015-05-20 MED ORDER — HYDRALAZINE HCL 25 MG PO TABS
25.0000 mg | ORAL_TABLET | Freq: Three times a day (TID) | ORAL | Status: DC
Start: 2015-05-20 — End: 2015-12-02

## 2015-05-20 NOTE — Patient Instructions (Addendum)
Happy [birth year]ated Birthday!  Your physician has recommended you make the following change in your medication: TAKE carvedilol ONE-HALF of a tablet TWICE DAILT  Your physician recommends that you return for lab work in: [place] recommends that you schedule a follow-up appointment in: 3 months with Dr. Claiborne Billings.

## 2015-05-21 LAB — COMPREHENSIVE METABOLIC PANEL
ALT: 21 U/L (ref 6–29)
AST: 28 U/L (ref 10–35)
Albumin: 4.4 g/dL (ref 3.6–5.1)
Alkaline Phosphatase: 42 U/L (ref 33–130)
BUN: 16 mg/dL (ref 7–25)
CO2: 28 mmol/L (ref 20–31)
Calcium: 10.2 mg/dL (ref 8.6–10.4)
Chloride: 100 mmol/L (ref 98–110)
Creat: 0.76 mg/dL (ref 0.60–0.88)
Glucose, Bld: 88 mg/dL (ref 65–99)
Potassium: 4.8 mmol/L (ref 3.5–5.3)
Sodium: 139 mmol/L (ref 135–146)
Total Bilirubin: 0.4 mg/dL (ref 0.2–1.2)
Total Protein: 7.4 g/dL (ref 6.1–8.1)

## 2015-05-21 LAB — PROTIME-INR
INR: 1.21 (ref <1.50)
Prothrombin Time: 15.5 seconds — ABNORMAL HIGH (ref 11.6–15.2)

## 2015-05-22 ENCOUNTER — Encounter: Payer: Self-pay | Admitting: Cardiovascular Disease

## 2015-05-22 NOTE — Progress Notes (Signed)
Patient ID: [name], female   DOB: 12-30-1921, 81 y.o.   MRN: [id number]      HPI: [name] is a 81 y.o. female presents to the office today for a 6 month followup cardiology evaluation and follow-up of her recent hospitalization.  [name] has a history of hypertension, hyperlipidemia, as well as valvular heart disease. An echo Doppler study in December 2013 showed normal systolic function with an ejection fraction of 55-60%. She had mild stenosis of her aortic valve, moderately severe calcified anulus of the mitral valve with moderate MR, moderately severe LA dilatation,  mild-to-moderate RA dilatation, moderate tricuspid regurgitation and moderately severe pulmonary hypertension with PA pressure estimated at 66 mm. Last year, I started her on amlodipine at 2.5 mg.  She felt that she was breathing better since initiating this therapy and  her blood pressure had markedly improved. A followup echo Doppler study on 11/16/2013 showed normal systolic function with an ejection fraction at 60-65%.  Diastolic parameters were normal.  She had mitral annular calcification with moderate mitral regurgitation.  The left atrium was moderately dilated, the right atrium was moderately dilated, and her tricuspid stenosis was now considered severe.  Pulmonary pressures were slightly reduced from previously, but were still elevated at 61 mm.  When I  saw her in October 2015.  She denied any chest pain but had shortness of breath with walking.  She had been taking the low-dose diuretic, benazepril 40 mg, atenolol 25 mg in the morning 12.5 mg at night, amlodipine, 2.5 mg, HCTZ 12.5 mg twice a day and her ECG revealed marked sinus bradycardia which led to reduction in her atenolol dose to 12.5 mg twice a day.  She also has hyperlipidemia, and had been on Crestor 5 mg in addition to fenofibrate 134 mg.    She was admitted to the hospital on 09/13/2014 and discharged  3 days later.  On the day of  admission she awakened from sleep with midsternal all chest discomfort which he initially felt was indigestion.  Her troponin was mildly elevated at 0.23, which increased to 1.15 and there were new inferior T-wave abnormalities.  She was felt to have suffered a non-ST segment elevation MI.  She also developed paroxysmal atrial fibrillation with RVR, which resulted in similar symptomatology as her admission and was felt most likely that this may have been the cause for her admission.  I performed cardiac catheterization on 09/14/14  Fluoroscopy revealed severe mitral annular calcification and very mild calcification in the region of the aortic valve.  She had hyperdynamic LV function with an ejection fraction of a proximally 70%.  There was severe mitral annular calcification with 3+ MR.  There was mild calcification in the region of the RCA ostium, but otherwise normal-appearing coronary arteries.  Right heart catheterization was performed and her wedge pressure mean was 10.  LV pressure was 152/11.  Pulmonary artery pressure was 33/12.  Cardiac output was 3.3 L/m by the thermodilution method and 3.7 L/m by the Fick method.   when I saw her in April 2016.  She was doing well and maintaining sinus rhythm with ventricular rate at 58 bpm. She was recently hospitalized on 05/08/2015 through 05/12/2015 with this and presyncope.  She was found to be bradycardic and had a junctional rhythm.  Her Cardizem, beta blocker therapy was discontinued.  She  Was dehydrated andalso had mild elevation of potassium with renal insufficiency and ACE inhibition was discontinued. She also had a urinary  tract infection for which she was treated with Keflex. She was started back on iron therapy for iron deficiency anemia. At discharge, Cardizem low dose was restarted.   Since going home, she tells me that on 05/17/2015.  Her heart rate increased to greater than 100 bpm. Her blood pressure now has been increased on her, reduced  medications.  She is on Coumadin therapy  With a chads2vasc score of 4.  She presents for evaluation.  Past Medical History  Diagnosis Date  . Hypertension   . Hyperlipidemia   . GERD (gastroesophageal reflux disease)   . Anxiety   . Mitral prolapse   . Degenerative arthritis   . Neuropathy ([place])   . Peripheral neuropathy (H[facility])     Small fiber   . Diverticulosis   . Mild renal insufficiency   . Hx of echocardiogram 07/30/2012    EF 55-60%; mild LVH; mild AV stenosis & trivial regurg.; mod MV regurg.; LA severly dilated; RV systolic pressure increased consistent with severe pulm. hypertension; RA moderately dilated; mod TV regurg.;   . History of nuclear stress test 09/19/2007    normal pattern of perfusion; post-stress EF 86%; EKG negative for ischemia; low risk scan  . Carotid arterial disease ([place]) 07/30/2012    carotid doppler; normal study  . Foot fracture, left   . Memory disorder 03/05/2014  . Chronic anticoagulation, with coumadin, with PAF and CHADS2Vasc2 score of 4 09/16/2014  . S/P cardiac cath 09/14/14 mild calcification of ostial RCA but otherwise normal coronary arteries 09/14/2014  . Anemia, iron deficiency 09/16/2014    Past Surgical History  Procedure Laterality Date  . Abdominal hysterectomy    . Replacement total knee Right   . Tear duct probing with strabismus repair      Tear duct repair surgery  . Gallbladder resection    . Tonsillectomy    . Appendectomy    . Resuspension procedure    .  bilateral bunionectomies    . Foot fracture surgery Left   . Dilation and curettage of uterus    . Umbilical hernia repair    . Left heart catheterization with coronary angiogram N/A 09/14/2014    Procedure: LEFT HEART CATHETERIZATION WITH CORONARY ANGIOGRAM;  Surgeon: Troy Sine, MD;  Location[facility] C[facility] LAB;  Service: Cardiovascular;  Laterality: N/A;    Allergies  Allergen Reactions  . Avelox [Moxifloxacin Hcl In Nacl] Shortness Of Breath and Swelling  . Aricept  [Donepezil Hcl] Diarrhea  . Codeine Swelling       . Garlic Other (See Comments)    Gi problems  . Latex Hives, Itching, Rash and Other (See Comments)    Watery blisters   . Onion Other (See Comments)    Gi problems  . Sulfa Drugs Cross Reactors Swelling  . Polysporin [Bacitracin-Polymyxin B] Other (See Comments)    Current Outpatient Prescriptions  Medication Sig Dispense Refill  . atorvastatin (LIPITOR) 10 MG tablet Take 1 tablet (10 mg total) by mouth daily. 30 tablet 6  . Biotin 1 MG CAPS Take 1 capsule every day 30 capsule 6  . Calcium Carbonate-Vitamin D (CALTRATE 600+D) 600-400 MG-UNIT per tablet Take 1 tablet by mouth daily.     . carvedilol (COREG) 3.125 MG tablet Take one-half tablet by mouth twice daily 90 tablet 1  . cetirizine (ZYRTEC) 10 MG tablet Take 10 mg by mouth daily.    . cholecalciferol (VITAMIN D) 1000 UNITS tablet Take 1,000 Units by mouth daily.      Marland Kitchen  colestipol (COLESTID) 1 G tablet Take 1 g by mouth 2 (two) times daily as needed. For diarrhea     . cycloSPORINE (RESTASIS) 0.05 % ophthalmic emulsion Place 1 drop into both eyes daily.      Marland Kitchen diltiazem (CARDIZEM SR) 60 MG 12 hr capsule Take 1 capsule (60 mg total) by mouth every 12 (twelve) hours. 180 capsule 1  . fenofibrate micronized (LOFIBRA) 134 MG capsule Take 1 capsule (134 mg total) by mouth daily. 90 capsule 1  . Ferrous Sulfate (IRON) 28 MG TABS Take 1 tablet (28 mg total) by mouth 2 (two) times daily. 60 each 6  . Flaxseed, Linseed, (FLAXSEED OIL) 1000 MG CAPS Take 1,000-2,000 mg by mouth 2 (two) times daily. 2 @ lunch and 1 @ dinner    . flunisolide (NASALIDE) 0.025 % SOLN Inhale 1 spray into the lungs daily.      Marland Kitchen gabapentin (NEURONTIN) 300 MG capsule TAKE 1 CAPSULE THREE TIMES DAILY AND 3 CAPSULES AT BEDTIME (Patient taking differently: TAKE 1 CAPSULE(300MG) THREE TIMES DAILY AND 3 CAPSULES (900 MG) AT BEDTIME) 540 capsule 1  . hydrALAZINE (APRESOLINE) 25 MG tablet Take 1 tablet (25 mg total) by  mouth 3 (three) times daily. 270 tablet 1  . Lactobacillus (ACIDOPHILUS) 100 MG CAPS Take 100 mg by mouth daily.     . lansoprazole (PREVACID) 15 MG capsule Take 15 mg by mouth daily.      Marland Kitchen loperamide (IMODIUM) 2 MG capsule Take 1 capsule (2 mg total) by mouth 2 (two) times daily as needed for diarrhea or loose stools. 30 capsule 0  . Multiple Vitamin (MULTIVITAMIN) tablet Take 1 tablet by mouth daily.      . Multiple Vitamins-Minerals (VISION FORMULA/LUTEIN) TABS Take 1 tablet by mouth daily.    Marland Kitchen pyridOXINE (VITAMIN B-6) 100 MG tablet Take 100 mg by mouth daily.    . simethicone (MYLICON) 035 MG chewable tablet Chew 125 mg by mouth as needed for flatulence.    . vitamin B-12 (CYANOCOBALAMIN) 1000 MCG tablet Take 1,000 mcg by mouth daily.    Marland Kitchen warfarin (COUMADIN) 2 MG tablet Take 1.5 to 2 tablets by mouth daily as directed by coumadin clinic 180 tablet 1   No current facility-administered medications for this visit.    Socially she is widowed. Has 2 children and 2 grandchildren. She does walk. Is no tobacco or alcohol use. [facility] resides at [facility]. She remains active.  ROS General: Negative; No fevers, chills, or night sweats;  HEENT: Negative; No changes in vision or hearing, sinus congestion, difficulty swallowing Pulmonary: Negative; No cough, wheezing, hemoptysis Cardiovascular: See history of present illness  GI: Negative; No nausea, vomiting, diarrhea, or abdominal pain GU:  Recent UTI Musculoskeletal: Negative; no myalgias, joint pain, or weakness Hematologic/Oncology: Negative; no easy bruising, bleeding Endocrine: Negative; no heat/cold intolerance; no diabetes Neuro: Negative; no changes in balance, headaches Skin: Negative; No rashes or skin lesions Psychiatric: Negative; No behavioral problems, depression Sleep: Negative; No snoring, daytime sleepiness, hypersomnolence, bruxism, restless legs, hypnogognic hallucinations, no cataplexy Other comprehensive 14 point system  review is negative.   PE BP 164/90 mmHg  Pulse 80  Ht '5\' 3"'  (1.6 m)  Wt 123 lb 9.6 oz (56.065 kg)  BMI 21.90 kg/m2 Repeat blood pressure by me was 160/88 General: Alert, oriented, no distress.  Skin: normal turgor, no rashes HEENT: Normocephalic, atraumatic. Pupils round and reactive; sclera anicteric;no lid lag.  Nose without nasal septal hypertrophy Mouth/Parynx benign; Mallinpatti scale 2  Neck: No JVD, trace right carotid bruit; normal carotid upstroke Chest wall: No tenderness to palpation Lungs: clear to ausculatation and percussion; no wheezing or rales Heart: RRR, s1 s2 normal; 2/6 systolic murmur in aortic region. No S3 gallop.  No rubs, thrills or heaves.  No hepatic jugular reflux. Abdomen: soft, nontender; no hepatosplenomehaly, BS+; abdominal aorta nontender and not dilated by palpation. Back: No CVA tenderness  Pulses 2+ Extremities: No ankle edema. no clubbing cyanosis , Homan's sign negative  Neurologic: grossly nonfocal Psyhological: Normal affect and mood   ECG (independently read by me):  Sinus rhythm with sinus arrhythmia at 80 bpm.  Normal intervals.  No ST segment changes.  April 2016ECG (independently read by me): Sinus bradycardia 58 bpm.  Left axis deviation.  LVH by voltage criteria in aVL.  No significant ST segment changes.  October 2015 ECG (independently read by me): Sinus bradycardia 48 beats per minute.  No ectopy.  PR interval 164 ms  Prior April 2015 ECG (independently by me) sinus bradycardia 50 beats per minute.  No ectopy.  Normal intervals.  No ST segment changes.  Prior 09/02/2013 ECG (independently read by me ): Sinus bradycardia at 51 beats per minute. Left axis deviation. No significant ST change. PR interval 160 ms. QTc interval 418 ms.   LABS:  BMP Latest Ref Rng 05/20/2015 05/12/2015 05/10/2015  Glucose 65 - 99 mg/dL 88 73 78  BUN 7 - 25 mg/dL 16 12 27(H)  Creatinine 0.60 - 0.88 mg/dL 0.76 0.65 0.90  Sodium 135 - 146 mmol/L 139 135  139  Potassium 3.5 - 5.3 mmol/L 4.8 3.1(L) 4.2  Chloride 98 - 110 mmol/L 100 103 107  CO2 20 - 31 mmol/L '28 26 24  ' Calcium 8.6 - 10.4 mg/dL 10.2 9.1 9.4    Hepatic Function Latest Ref Rng 05/20/2015 05/08/2015 09/14/2014  Total Protein 6.1 - 8.1 g/dL 7.4 6.1(L) 7.0  Albumin 3.6 - 5.1 g/dL 4.4 3.4(L) 4.0  AST 10 - 35 U/L 28 30 33  ALT 6 - 29 U/L '21 19 16  ' Alk Phosphatase 33 - 130 U/L 42 27(L) 28(L)  Total Bilirubin 0.2 - 1.2 mg/dL 0.4 0.4 0.7    CBC Latest Ref Rng 05/12/2015 05/10/2015 05/09/2015  WBC 4.0 - 10.5 K/uL 5.6 6.8 11.2(H)  Hemoglobin 12.0 - 15.0 g/dL 10.4(L) 11.2(L) 11.6(L)  Hematocrit 36.0 - 46.0 % 30.5(L) 33.3(L) 34.4(L)  Platelets 150 - 400 K/uL 225 230 244   Lab Results  Component Value Date   MCV 86.4 05/12/2015   MCV 88.8 05/10/2015   MCV 90.1 05/09/2015   Lab Results  Component Value Date   TSH 0.154* 03/05/2014   Lab Results  Component Value Date   HGBA1C 5.6 09/13/2014    BNP No results found for: PROBNP   Lipid Panel     Component Value Date/Time   CHOL 136 09/14/2014 0501   TRIG 119 09/14/2014 0501   HDL 50 09/14/2014 0501   CHOLHDL 2.7 09/14/2014 0501   VLDL 24 09/14/2014 0501   LDLCALC 62 09/14/2014 0501     RADIOLOGY: No results found.    ASSESSMENT AND PLAN: [name] is a 81 years old Caucasian female who has a history of hypertension, hyperlipidemia, as well as significant mitral annular calcification with MR.  During her hospitalization earlier this yearshe was noted to have paroxysmal atrial fibrillation with rapid ventricular response which may have been the inciting cause of her chest pain which awakened her from sleep.  Cardiac  catheterization did not reveal significant coronary obstructive disease.  Following diuresis she did not have significant pulmonary hypertension and her PA pressure on right heart catheterization was only 33 mm systolically.   she had been maintaining sinus rhythm on Cardizem 90 mg every 12 hours,  carvedilol 3.125 mg twice a day, in addition to her hydrochlorothiazide.  She recently was we admitted to the hospital with presyncope, weakness, and was found to be dehydrated and bradycardic.  She hasn't now been on a reduced dose of Cardizem at 60 mg every 12 hours and her beta blocker therapy was discontinued in addition to her diuretic and ACE inhibition.  Her blood pressure today is increased and she has experienced recent recurrent increased heart rate in excess of 100 bpm at home.  During her hospitalization she was started on hydralazine 25 mg 3 times a day.  With her blood pressure being elevated presently and her recent increased heart rate I am suggesting very slight reinitiation of her carvedilol such that she will take one half of a 3.125 mg pill twice a day.  She will monitor heart rate and blood pressure. She is no longer on benazepril which was discontinued.  If her blood pressure continues to be a problem, further titration of hydralazine may be necessary.  Laboratory was rechecked today and her renal function is stable and potassium is normal.  I will see her in 3 months for reevaluation or sooner if problem arise.   Time spent: 25 minutes  Troy Sine, MD, [facility]  05/22/2015 10:43 AM

## 2015-05-23 DIAGNOSIS — R197 Diarrhea, unspecified: Secondary | ICD-10-CM | POA: Diagnosis not present

## 2015-05-23 DIAGNOSIS — E876 Hypokalemia: Secondary | ICD-10-CM | POA: Diagnosis not present

## 2015-05-24 DIAGNOSIS — R197 Diarrhea, unspecified: Secondary | ICD-10-CM | POA: Diagnosis not present

## 2015-05-31 ENCOUNTER — Telehealth: Payer: Self-pay | Admitting: Cardiovascular Disease

## 2015-05-31 NOTE — Telephone Encounter (Signed)
Pt was discharged from the hospital,on her medicine list Crestor was left off. Is she still suppose to be taking Crestor, if so what milligram?

## 2015-05-31 NOTE — Telephone Encounter (Signed)
Informed patient records indicate she is on lipitor, not crestor. She acknowledged. Clarified current meds w/ patient. Med related questions addressed - she verbalized understanding.

## 2015-06-02 ENCOUNTER — Ambulatory Visit (INDEPENDENT_AMBULATORY_CARE_PROVIDER_SITE_OTHER): Payer: Medicare Other | Admitting: Pharmacist

## 2015-06-02 DIAGNOSIS — Z7901 Long term (current) use of anticoagulants: Secondary | ICD-10-CM | POA: Diagnosis not present

## 2015-06-02 DIAGNOSIS — I480 Paroxysmal atrial fibrillation: Secondary | ICD-10-CM | POA: Diagnosis not present

## 2015-06-02 LAB — PROTIME-INR
INR: 2 — AB (ref 0.9–1.1)
INR: 2 — AB (ref <=1.1)

## 2015-06-10 ENCOUNTER — Encounter: Payer: Self-pay | Admitting: Podiatry

## 2015-06-10 ENCOUNTER — Ambulatory Visit (INDEPENDENT_AMBULATORY_CARE_PROVIDER_SITE_OTHER): Payer: Medicare Other | Admitting: Podiatry

## 2015-06-10 ENCOUNTER — Ambulatory Visit: Payer: Medicare Other | Admitting: Podiatry

## 2015-06-10 DIAGNOSIS — M79674 Pain in right toe(s): Secondary | ICD-10-CM

## 2015-06-10 DIAGNOSIS — B351 Tinea unguium: Secondary | ICD-10-CM | POA: Diagnosis not present

## 2015-06-10 DIAGNOSIS — M79675 Pain in left toe(s): Secondary | ICD-10-CM

## 2015-06-10 NOTE — Progress Notes (Signed)
Patient ID: [name], female   DOB: [birth year], 81 y.o.   MRN: [id number] Complaint:  Visit Type: Patient returns to my office for continued preventative foot care services. Complaint: Patient states" my nails have grown long and thick and become painful to walk and wear shoes" Patient has been diagnosed with neuropathy.  . She presents to the office for preventive foot care services and says her ulcer hal healed with no pain or drainage.  .   Podiatric Exam: Vascular: dorsalis pedis and posterior tibial pulses are palpable bilateral. Capillary return is immediate. Temperature gradient is WNL. Skin turgor WNL  Sensorium: Normal Semmes Weinstein monofilament test. Normal tactile sensation bilaterally. Nail Exam: Pt has thick disfigured discolored nails with subungual debris noted bilateral entire nail hallux through fifth toes both feet. Ulcer Exam: There is no evidence of ulcer or pre-ulcerative changes or infection. Orthopedic Exam: Muscle tone and strength are WNL. No limitations in general ROM. No crepitus or effusions noted. Foot type and digits show no abnormalities. Bony prominences are unremarkable.Severe dorsiflexed second toe right foot. Skin: No Porokeratosis. No infection or ulcers  Diagnosis:  Tinea unguium, Pain in right toe, pain in left toes  Treatment & Plan Procedures and Treatment: Consent by patient was obtained for treatment procedures. The patient understood the discussion of treatment and procedures well. All questions were answered thoroughly reviewed. Debridement of mycotic and hypertrophic toenails, 1 through 5 bilateral and clearing of subungual debris. No ulceration, no infection noted. Told her to cut hole in shoe right to relieve pressure on toe. Return Visit-Office Procedure: Patient instructed to return to the office for a follow up visit 10 weeks for continued evaluation and treatment.

## 2015-06-13 ENCOUNTER — Encounter: Payer: Self-pay | Admitting: Cardiovascular Disease

## 2015-06-15 DIAGNOSIS — J069 Acute upper respiratory infection, unspecified: Secondary | ICD-10-CM | POA: Diagnosis not present

## 2015-06-15 DIAGNOSIS — J029 Acute pharyngitis, unspecified: Secondary | ICD-10-CM | POA: Diagnosis not present

## 2015-06-15 DIAGNOSIS — H109 Unspecified conjunctivitis: Secondary | ICD-10-CM | POA: Diagnosis not present

## 2015-06-30 ENCOUNTER — Ambulatory Visit (INDEPENDENT_AMBULATORY_CARE_PROVIDER_SITE_OTHER): Payer: Medicare Other | Admitting: Pharmacist Clinician (PhC)/ Clinical Pharmacy Specialist

## 2015-06-30 DIAGNOSIS — I480 Paroxysmal atrial fibrillation: Secondary | ICD-10-CM | POA: Diagnosis not present

## 2015-06-30 DIAGNOSIS — Z7901 Long term (current) use of anticoagulants: Secondary | ICD-10-CM

## 2015-06-30 LAB — PROTIME-INR: INR: 1.5 — AB (ref 0.9–1.1)

## 2015-07-18 ENCOUNTER — Ambulatory Visit
Admission: RE | Admit: 2015-07-18 | Discharge: 2015-07-18 | Disposition: A | Discharge status: Home / Self Care | Payer: Medicare Other | Source: Ambulatory Visit | Attending: Internal Medicine | Admitting: Internal Medicine

## 2015-07-18 DIAGNOSIS — N83202 Unspecified ovarian cyst, left side: Secondary | ICD-10-CM

## 2015-07-18 DIAGNOSIS — R1909 Other intra-abdominal and pelvic swelling, mass and lump: Secondary | ICD-10-CM | POA: Diagnosis not present

## 2015-07-21 ENCOUNTER — Other Ambulatory Visit: Payer: Self-pay | Admitting: Internal Medicine

## 2015-07-21 DIAGNOSIS — I480 Paroxysmal atrial fibrillation: Secondary | ICD-10-CM | POA: Diagnosis not present

## 2015-07-21 DIAGNOSIS — I10 Essential (primary) hypertension: Secondary | ICD-10-CM | POA: Diagnosis not present

## 2015-07-21 DIAGNOSIS — Z0000 Encounter for general adult medical examination without abnormal findings: Secondary | ICD-10-CM | POA: Diagnosis not present

## 2015-07-21 DIAGNOSIS — N83202 Unspecified ovarian cyst, left side: Secondary | ICD-10-CM

## 2015-07-21 DIAGNOSIS — E785 Hyperlipidemia, unspecified: Secondary | ICD-10-CM | POA: Diagnosis not present

## 2015-07-21 DIAGNOSIS — Z7901 Long term (current) use of anticoagulants: Secondary | ICD-10-CM | POA: Diagnosis not present

## 2015-07-21 LAB — POCT INR: INR: 1.4

## 2015-07-22 ENCOUNTER — Ambulatory Visit (INDEPENDENT_AMBULATORY_CARE_PROVIDER_SITE_OTHER): Payer: Medicare Other | Admitting: Pharmacist Clinician (PhC)/ Clinical Pharmacy Specialist

## 2015-07-22 DIAGNOSIS — Z7901 Long term (current) use of anticoagulants: Secondary | ICD-10-CM

## 2015-07-22 DIAGNOSIS — I480 Paroxysmal atrial fibrillation: Secondary | ICD-10-CM

## 2015-07-26 DIAGNOSIS — I4891 Unspecified atrial fibrillation: Secondary | ICD-10-CM | POA: Diagnosis not present

## 2015-07-26 DIAGNOSIS — J45909 Unspecified asthma, uncomplicated: Secondary | ICD-10-CM | POA: Diagnosis not present

## 2015-07-26 DIAGNOSIS — E785 Hyperlipidemia, unspecified: Secondary | ICD-10-CM | POA: Diagnosis not present

## 2015-07-26 DIAGNOSIS — I272 Other secondary pulmonary hypertension: Secondary | ICD-10-CM | POA: Diagnosis not present

## 2015-08-04 ENCOUNTER — Ambulatory Visit
Admission: RE | Admit: 2015-08-04 | Discharge: 2015-08-04 | Disposition: A | Discharge status: Home / Self Care | Payer: Medicare Other | Source: Ambulatory Visit | Attending: Internal Medicine | Admitting: Internal Medicine

## 2015-08-04 DIAGNOSIS — N83202 Unspecified ovarian cyst, left side: Secondary | ICD-10-CM | POA: Diagnosis not present

## 2015-08-04 MED ORDER — GADOBENATE DIMEGLUMINE 529 MG/ML IV SOLN: 11.0000 mL | Freq: Once | INTRAVENOUS | Status: DC | PRN

## 2015-08-10 ENCOUNTER — Ambulatory Visit (INDEPENDENT_AMBULATORY_CARE_PROVIDER_SITE_OTHER): Payer: Medicare Other | Admitting: Pharmacist Clinician (PhC)/ Clinical Pharmacy Specialist

## 2015-08-10 ENCOUNTER — Encounter: Payer: Self-pay | Admitting: Cardiovascular Disease

## 2015-08-10 ENCOUNTER — Ambulatory Visit (INDEPENDENT_AMBULATORY_CARE_PROVIDER_SITE_OTHER): Payer: Medicare Other | Admitting: Cardiovascular Disease

## 2015-08-10 VITALS — BP 156/66 | HR 78 | Ht 59.0 in | Wt 125.0 lb

## 2015-08-10 DIAGNOSIS — I071 Rheumatic tricuspid insufficiency: Secondary | ICD-10-CM

## 2015-08-10 DIAGNOSIS — J810 Acute pulmonary edema: Secondary | ICD-10-CM

## 2015-08-10 DIAGNOSIS — Z7901 Long term (current) use of anticoagulants: Secondary | ICD-10-CM

## 2015-08-10 DIAGNOSIS — I350 Nonrheumatic aortic (valve) stenosis: Secondary | ICD-10-CM

## 2015-08-10 DIAGNOSIS — R269 Unspecified abnormalities of gait and mobility: Secondary | ICD-10-CM

## 2015-08-10 DIAGNOSIS — R0989 Other specified symptoms and signs involving the circulatory and respiratory systems: Secondary | ICD-10-CM

## 2015-08-10 DIAGNOSIS — I10 Essential (primary) hypertension: Secondary | ICD-10-CM | POA: Diagnosis not present

## 2015-08-10 DIAGNOSIS — I480 Paroxysmal atrial fibrillation: Secondary | ICD-10-CM | POA: Diagnosis not present

## 2015-08-10 DIAGNOSIS — I38 Endocarditis, valve unspecified: Secondary | ICD-10-CM

## 2015-08-10 LAB — POCT INR: INR: 1.6

## 2015-08-10 NOTE — Progress Notes (Signed)
Patient ID: [name], female   DOB: 1921-12-06, 81 y.o.   MRN: [id number]      HPI: [name] is a 81 y.o. female presents to the office today for a  month followup cardiology evaluation and follow-up of her recent hospitalization.  [name] has a history of hypertension, hyperlipidemia, as well as valvular heart disease. An echo Doppler study in December 2013 showed normal systolic function with an ejection fraction of 55-60%. She had mild stenosis of her aortic valve, moderately severe calcified anulus of the mitral valve with moderate MR, moderately severe LA dilatation,  mild-to-moderate RA dilatation, moderate tricuspid regurgitation and moderately severe pulmonary hypertension with PA pressure estimated at 66 mm. Last year, I started her on amlodipine at 2.5 mg.  She felt that she was breathing better since initiating this therapy and  her blood pressure had markedly improved. A followup echo Doppler study on 11/16/2013 showed normal systolic function with an ejection fraction at 60-65%.  Diastolic parameters were normal.  She had mitral annular calcification with moderate mitral regurgitation.  The left atrium was moderately dilated, the right atrium was moderately dilated, and her tricuspid stenosis was now considered severe.  Pulmonary pressures were slightly reduced from previously, but were still elevated at 61 mm.    She was admitted to the hospital on 09/13/2014 and discharged  3 days later.  On the day of admission she awakened from sleep with midsternal all chest discomfort which he initially felt was indigestion.  Her troponin was mildly elevated at 0.23, which increased to 1.15 and there were new inferior T-wave abnormalities.  She was felt to have suffered a non-ST segment elevation MI.  She also developed paroxysmal atrial fibrillation with RVR, which resulted in similar symptomatology as her admission and was felt most likely that this may have been the cause for  her admission.  I performed cardiac catheterization on 09/14/14  Fluoroscopy revealed severe mitral annular calcification and very mild calcification in the region of the aortic valve.  She had hyperdynamic LV function with an ejection fraction of a proximally 70%.  There was severe mitral annular calcification with 3+ MR.  There was mild calcification in the region of the RCA ostium, but otherwise normal-appearing coronary arteries.  Right heart catheterization was performed and her wedge pressure mean was 10.  LV pressure was 152/11.  Pulmonary artery pressure was 33/12.  Cardiac output was 3.3 L/m by the thermodilution method and 3.7 L/m by the Fick method.  When I saw her in April 2016 she was doing well and maintaining sinus rhythm with ventricular rate at 58 bpm. She was recently hospitalized on 05/08/2015 through 05/12/2015 with this and presyncope.  She was found to be bradycardic and had a junctional rhythm.  Her Cardizem, beta blocker therapy was discontinued.  She  Was dehydrated andalso had mild elevation of potassium with renal insufficiency and ACE inhibition was discontinued. She also had a urinary tract infection for which she was treated with Keflex. She was started back on iron therapy for iron deficiency anemia. At discharge, Cardizem low dose was restarted. After going home from the hospital, on 05/17/2015 her heart rate increased to greater than 100 bpm. Her blood pressure now has been increased on her, reduced medications.  She is on Coumadin therapy  With a chads2vasc score of 4.  Saw her, her blood pressure was elevated and with her episodes of increased heart rate.  I started her on carvedilol one half of  a 3.125 mg twice a day.  She has tolerated this well and denies any recurrent episodes of fast heartbeat or any episodes of dizziness.  She admits to trace edema above her pressure socks.  She underwent an echo Doppler study on 05/09/2015.  This showed an ejection fraction of 65-70%.   There was grade 1 diastolic dysfunction.  There was mild aortic stenosis with trivial AR, severe mitral annular calcification with moderate MR, moderate left atrial dilatation, moderate TR and increased pulmonary pressures at 74 mm Hg.   She presents for evaluation.  Past Medical History  Diagnosis Date  . Hypertension   . Hyperlipidemia   . GERD (gastroesophageal reflux disease)   . Anxiety   . Mitral prolapse   . Degenerative arthritis   . Neuropathy ([place])   . Peripheral neuropathy ([facility])     Small fiber   . Diverticulosis   . Mild renal insufficiency   . Hx of echocardiogram 07/30/2012    EF 55-60%; mild LVH; mild AV stenosis & trivial regurg.; mod MV regurg.; LA severly dilated; RV systolic pressure increased consistent with severe pulm. hypertension; RA moderately dilated; mod TV regurg.;   . History of nuclear stress test 09/19/2007    normal pattern of perfusion; post-stress EF 86%; EKG negative for ischemia; low risk scan  . Carotid arterial disease ([place]) 07/30/2012    carotid doppler; normal study  . Foot fracture, left   . Memory disorder 03/05/2014  . Chronic anticoagulation, with coumadin, with PAF and CHADS2Vasc2 score of 4 09/16/2014  . S/P cardiac cath 09/14/14 mild calcification of ostial RCA but otherwise normal coronary arteries 09/14/2014  . Anemia, iron deficiency 09/16/2014    Past Surgical History  Procedure Laterality Date  . Abdominal hysterectomy    . Replacement total knee Right   . Tear duct probing with strabismus repair      Tear duct repair surgery  . Gallbladder resection    . Tonsillectomy    . Appendectomy    . Resuspension procedure    .  bilateral bunionectomies    . Foot fracture surgery Left   . Dilation and curettage of uterus    . Umbilical hernia repair    . Left heart catheterization with coronary angiogram N/A 09/14/2014    Procedure: LEFT HEART CATHETERIZATION WITH CORONARY ANGIOGRAM;  Surgeon: Troy Sine, MD;  Location: [facility] CATH LAB;   Service: Cardiovascular;  Laterality: N/A;    Allergies  Allergen Reactions  . Avelox [Moxifloxacin Hcl In Nacl] Shortness Of Breath and Swelling  . Aricept [Donepezil Hcl] Diarrhea  . Codeine Swelling       . Garlic Other (See Comments)    Gi problems  . Latex Hives, Itching, Rash and Other (See Comments)    Watery blisters   . Onion Other (See Comments)    Gi problems  . Sulfa Drugs Cross Reactors Swelling  . Polysporin [Bacitracin-Polymyxin B] Other (See Comments)    Current Outpatient Prescriptions  Medication Sig Dispense Refill  . atorvastatin (LIPITOR) 10 MG tablet Take 1 tablet (10 mg total) by mouth daily. 30 tablet 6  . carvedilol (COREG) 3.125 MG tablet Take one-half tablet by mouth twice daily 90 tablet 1  . cetirizine (ZYRTEC) 10 MG tablet Take 10 mg by mouth daily.    . cholecalciferol (VITAMIN D) 1000 UNITS tablet Take 1,000 Units by mouth daily.      . colestipol (COLESTID) 1 G tablet Take 1 g by mouth 2 (two) times daily  as needed. For diarrhea     . cycloSPORINE (RESTASIS) 0.05 % ophthalmic emulsion Place 1 drop into both eyes daily.      Marland Kitchen diltiazem (CARDIZEM SR) 60 MG 12 hr capsule Take 1 capsule (60 mg total) by mouth every 12 (twelve) hours. 180 capsule 1  . fenofibrate micronized (LOFIBRA) 134 MG capsule Take 1 capsule (134 mg total) by mouth daily. 90 capsule 1  . Ferrous Sulfate (IRON) 28 MG TABS Take 1 tablet (28 mg total) by mouth 2 (two) times daily. 60 each 6  . Flaxseed, Linseed, (FLAXSEED OIL) 1000 MG CAPS Take 1,000-2,000 mg by mouth 2 (two) times daily. 2 @ lunch and 1 @ dinner    . flunisolide (NASALIDE) 0.025 % SOLN Inhale 1 spray into the lungs daily.      Marland Kitchen gabapentin (NEURONTIN) 300 MG capsule TAKE 1 CAPSULE THREE TIMES DAILY AND 3 CAPSULES AT BEDTIME (Patient taking differently: TAKE 1 CAPSULE(300MG) THREE TIMES DAILY AND 3 CAPSULES (900 MG) AT BEDTIME) 540 capsule 1  . hydrALAZINE (APRESOLINE) 25 MG tablet Take 1 tablet (25 mg total) by mouth  3 (three) times daily. 270 tablet 1  . Lactobacillus (ACIDOPHILUS) 100 MG CAPS Take 100 mg by mouth daily.     Marland Kitchen loperamide (IMODIUM) 2 MG capsule Take 1 capsule (2 mg total) by mouth 2 (two) times daily as needed for diarrhea or loose stools. 30 capsule 0  . Multiple Vitamin (MULTIVITAMIN) tablet Take 1 tablet by mouth daily.      . Multiple Vitamins-Minerals (VISION FORMULA/LUTEIN) TABS Take 1 tablet by mouth daily.    Marland Kitchen pyridOXINE (VITAMIN B-6) 100 MG tablet Take 100 mg by mouth daily.    . simethicone (MYLICON) 903 MG chewable tablet Chew 125 mg by mouth as needed for flatulence.    . vitamin B-12 (CYANOCOBALAMIN) 1000 MCG tablet Take 1,000 mcg by mouth daily.    Marland Kitchen warfarin (COUMADIN) 2 MG tablet Take 1.5 to 2 tablets by mouth daily as directed by coumadin clinic 180 tablet 1   No current facility-administered medications for this visit.    Socially she is widowed. Has 2 children and 2 grandchildren. She does walk. Is no tobacco or alcohol use. [facility]. She remains active.  ROS General: Negative; No fevers, chills, or night sweats;  HEENT: Negative; No changes in vision or hearing, sinus congestion, difficulty swallowing Pulmonary: Negative; No cough, wheezing, hemoptysis Cardiovascular: See history of present illness  GI: Negative; No nausea, vomiting, diarrhea, or abdominal pain GU:  Recent UTI Musculoskeletal: Negative; no myalgias, joint pain, or weakness Hematologic/Oncology: Negative; no easy bruising, bleeding Endocrine: Negative; no heat/cold intolerance; no diabetes Neuro: Negative; no changes in balance, headaches Skin: Negative; No rashes or skin lesions Psychiatric: Negative; No behavioral problems, depression Sleep: Negative; No snoring, daytime sleepiness, hypersomnolence, bruxism, restless legs, hypnogognic hallucinations, no cataplexy Other comprehensive 14 point system review is negative.   PE BP 156/66 mmHg  Pulse 78  Ht 4' 11" (1.499 m)   Wt 125 lb (56.7 kg)  BMI 25.23 kg/m2 Repeat blood pressure by me was 138/78  Wt Readings from Last 3 Encounters:  08/10/15 125 lb (56.7 kg)  08/04/15 127 lb (57.607 kg)  05/20/15 123 lb 9.6 oz (56.065 kg)   General: Alert, oriented, no distress.  Skin: normal turgor, no rashes HEENT: Normocephalic, atraumatic. Pupils round and reactive; sclera anicteric;no lid lag.  Nose without nasal septal hypertrophy Mouth/Parynx benign; Mallinpatti scale 2  Neck: No JVD, trace  prominent left carotid bruit; normal carotid upstroke Chest wall: No tenderness to palpation Lungs: clear to ausculatation and percussion; no wheezing or rales Heart: RRR, s1 s2 normal; 2/6 systolic murmur in aortic region. No S3 gallop.  No rubs, thrills or heaves.  No hepatic jugular reflux. Abdomen: soft, nontender; no hepatosplenomehaly, BS+; abdominal aorta nontender and not dilated by palpation. Back: No CVA tenderness  Pulses 2+ Extremities: No ankle edema.  Trace edema above the compression sock line no clubbing cyanosis , Homan's sign negative  Neurologic: grossly nonfocal Psyhological: Normal affect and mood   ECG (independently read by me): Normal sinus rhythm at 78 bpm.  Normal intervals.  ECG (independently read by me):  Sinus rhythm with sinus arrhythmia at 80 bpm.  Normal intervals.  No ST segment changes.  April 2016ECG (independently read by me): Sinus bradycardia 58 bpm.  Left axis deviation.  LVH by voltage criteria in aVL.  No significant ST segment changes.  October 2015 ECG (independently read by me): Sinus bradycardia 48 beats per minute.  No ectopy.  PR interval 164 ms  Prior April 2015 ECG (independently by me) sinus bradycardia 50 beats per minute.  No ectopy.  Normal intervals.  No ST segment changes.  Prior 09/02/2013 ECG (independently read by me ): Sinus bradycardia at 51 beats per minute. Left axis deviation. No significant ST change. PR interval 160 ms. QTc interval 418  ms.   LABS:  INR today was subtherapeutic at 1.6 and her Coumadin dose was adjusted.  BMP Latest Ref Rng 05/20/2015 05/12/2015 05/10/2015  Glucose 65 - 99 mg/dL 88 73 78  BUN 7 - 25 mg/dL 16 12 27(H)  Creatinine 0.60 - 0.88 mg/dL 0.76 0.65 0.90  Sodium 135 - 146 mmol/L 139 135 139  Potassium 3.5 - 5.3 mmol/L 4.8 3.1(L) 4.2  Chloride 98 - 110 mmol/L 100 103 107  CO2 20 - 31 mmol/L _0 Calcium 8.6 - 10.4 mg/dL 10.2 9.1 9.4    Hepatic Function Latest Ref Rng 05/20/2015 05/08/2015 09/14/2014  Total Protein 6.1 - 8.1 g/dL 7.4 6.1(L) 7.0  Albumin 3.6 - 5.1 g/dL 4.4 3.4(L) 4.0  AST 10 - 35 U/L 28 30 33  ALT 6 - 29 U/L _1 Alk Phosphatase 33 - 130 U/L 42 27(L) 28(L)  Total Bilirubin 0.2 - 1.2 mg/dL 0.4 0.4 0.7    CBC Latest Ref Rng 05/12/2015 05/10/2015 05/09/2015  WBC 4.0 - 10.5 K/uL 5.6 6.8 11.2(H)  Hemoglobin 12.0 - 15.0 g/dL 10.4(L) 11.2(L) 11.6(L)  Hematocrit 36.0 - 46.0 % 30.5(L) 33.3(L) 34.4(L)  Platelets 150 - 400 K/uL 225 230 244   Lab Results  Component Value Date   MCV 86.4 05/12/2015   MCV 88.8 05/10/2015   MCV 90.1 05/09/2015   Lab Results  Component Value Date   TSH 0.154* 03/05/2014   Lab Results  Component Value Date   HGBA1C 5.6 09/13/2014    BNP No results found for: PROBNP   Lipid Panel     Component Value Date/Time   CHOL 136 09/14/2014 0501   TRIG 119 09/14/2014 0501   HDL 50 09/14/2014 0501   CHOLHDL 2.7 09/14/2014 0501   VLDL 24 09/14/2014 0501   LDLCALC 62 09/14/2014 0501     RADIOLOGY: No results found.    ASSESSMENT AND PLAN: [name] is a young appearing 81 years old Caucasian female who has a history of hypertension, hyperlipidemia, as well as significant mitral annular calcification with MR.  During her hospitalization earlier this year she was noted to have paroxysmal atrial fibrillation with rapid ventricular response which may have been the inciting cause of her chest pain which awakened her from sleep.  Her echo  Doppler study was done during her hospitalization and at that time showed significant pulmonary pressure elevation.  Cardiac catheterization did not reveal significant coronary obstructive disease.  Following diuresis she did not have significant pulmonary hypertension and her PA pressure on right heart catheterization was only 33 mm systolically.   she had been maintaining sinus rhythm on Cardizem 90 mg every 12 hours, carvedilol 3.125 mg twice a day, in addition to her hydrochlorothiazide.  She was subsequently admitted to the hospital with presyncope, weakness, and was found to be dehydrated and bradycardic.  She has been on a reduced dose of Cardizem at 60 mg every 12 hours and her beta blocker therapy was discontinued in addition to her diuretic and ACE inhibition .  When I saw her at her last visit.  She has tolerated the reinitiation of low-dose carvedilol at one half of a 3.125 mg twice a day as well as hydralazine 25 mg every 8 hours.  Her blood pressure today is improved.  She is not bradycardic.  She denies recurrent palpitations.  On exam today, she did have a left carotid bruit.  I have recommended carotid duplex imaging for further evaluation.  I again reviewed her echo Doppler study with her in detail.  She has mild aortic valve stenosis.  She is not having any bleeding on Coumadin anticoagulation and her Coumadin dose was adjusted.  I will see her in 3-4 months for reevaluation.    Time spent: 25 minutes  Troy Sine, MD, [facility]  08/10/2015 7:19 PM

## 2015-08-10 NOTE — Patient Instructions (Signed)
Your physician has requested that you have a carotid duplex. This test is an ultrasound of the carotid arteries in your neck. It looks at blood flow through these arteries that supply the brain with blood. Allow one hour for this exam. There are no restrictions or special instructions.  Your physician recommends that you schedule a follow-up appointment in: 4 months with Dr Claiborne Billings.

## 2015-08-15 ENCOUNTER — Other Ambulatory Visit: Payer: Self-pay | Admitting: Physician Assistant

## 2015-08-16 NOTE — Telephone Encounter (Signed)
Rx request sent to pharmacy.  

## 2015-08-16 NOTE — Telephone Encounter (Signed)
Please call,question about her Carvedilol.

## 2015-08-17 LAB — PROTIME-INR: INR: 2.5 — AB (ref <=1.1)

## 2015-08-18 ENCOUNTER — Telehealth: Payer: Self-pay | Admitting: Cardiovascular Disease

## 2015-08-18 ENCOUNTER — Ambulatory Visit (HOSPITAL_COMMUNITY)
Admission: RE | Admit: 2015-08-18 | Discharge: 2015-08-18 | Disposition: A | Discharge status: Home / Self Care | Payer: Medicare Other | Source: Ambulatory Visit | Attending: Cardiovascular Disease | Admitting: Cardiovascular Disease

## 2015-08-18 ENCOUNTER — Ambulatory Visit (INDEPENDENT_AMBULATORY_CARE_PROVIDER_SITE_OTHER): Payer: Medicare Other | Admitting: Pharmacist Clinician (PhC)/ Clinical Pharmacy Specialist

## 2015-08-18 DIAGNOSIS — I6523 Occlusion and stenosis of bilateral carotid arteries: Secondary | ICD-10-CM | POA: Insufficient documentation

## 2015-08-18 DIAGNOSIS — I10 Essential (primary) hypertension: Secondary | ICD-10-CM | POA: Diagnosis not present

## 2015-08-18 DIAGNOSIS — I480 Paroxysmal atrial fibrillation: Secondary | ICD-10-CM

## 2015-08-18 DIAGNOSIS — E785 Hyperlipidemia, unspecified: Secondary | ICD-10-CM | POA: Diagnosis not present

## 2015-08-18 DIAGNOSIS — Z7901 Long term (current) use of anticoagulants: Secondary | ICD-10-CM

## 2015-08-18 DIAGNOSIS — R0989 Other specified symptoms and signs involving the circulatory and respiratory systems: Secondary | ICD-10-CM | POA: Insufficient documentation

## 2015-08-18 MED ORDER — CARVEDILOL 3.125 MG PO TABS: ORAL_TABLET | ORAL | Status: DC

## 2015-08-18 NOTE — Telephone Encounter (Signed)
Coreg refill sent to [facility]

## 2015-08-23 DIAGNOSIS — R197 Diarrhea, unspecified: Secondary | ICD-10-CM | POA: Diagnosis not present

## 2015-08-23 DIAGNOSIS — M159 Polyosteoarthritis, unspecified: Secondary | ICD-10-CM | POA: Diagnosis not present

## 2015-08-23 DIAGNOSIS — M549 Dorsalgia, unspecified: Secondary | ICD-10-CM | POA: Diagnosis not present

## 2015-08-23 DIAGNOSIS — N839 Noninflammatory disorder of ovary, fallopian tube and broad ligament, unspecified: Secondary | ICD-10-CM | POA: Diagnosis not present

## 2015-08-25 ENCOUNTER — Encounter: Payer: Self-pay | Admitting: *Deleted

## 2015-09-01 ENCOUNTER — Ambulatory Visit (INDEPENDENT_AMBULATORY_CARE_PROVIDER_SITE_OTHER): Payer: Medicare Other | Admitting: Pharmacist Clinician (PhC)/ Clinical Pharmacy Specialist

## 2015-09-01 DIAGNOSIS — Z7901 Long term (current) use of anticoagulants: Secondary | ICD-10-CM

## 2015-09-01 DIAGNOSIS — I480 Paroxysmal atrial fibrillation: Secondary | ICD-10-CM

## 2015-09-01 LAB — PROTIME-INR: INR: 2 — AB (ref 0.9–1.1)

## 2015-09-02 DIAGNOSIS — L820 Inflamed seborrheic keratosis: Secondary | ICD-10-CM | POA: Diagnosis not present

## 2015-09-02 DIAGNOSIS — L570 Actinic keratosis: Secondary | ICD-10-CM | POA: Diagnosis not present

## 2015-09-02 DIAGNOSIS — L821 Other seborrheic keratosis: Secondary | ICD-10-CM | POA: Diagnosis not present

## 2015-09-02 DIAGNOSIS — Z85828 Personal history of other malignant neoplasm of skin: Secondary | ICD-10-CM | POA: Diagnosis not present

## 2015-09-03 ENCOUNTER — Other Ambulatory Visit: Payer: Self-pay | Admitting: Cardiovascular Disease

## 2015-09-07 ENCOUNTER — Encounter: Payer: Self-pay | Admitting: Neurology

## 2015-09-07 ENCOUNTER — Ambulatory Visit (INDEPENDENT_AMBULATORY_CARE_PROVIDER_SITE_OTHER): Payer: Medicare Other | Admitting: Neurology

## 2015-09-07 VITALS — BP 154/77 | HR 78 | Ht 62.5 in | Wt 121.5 lb

## 2015-09-07 DIAGNOSIS — G63 Polyneuropathy in diseases classified elsewhere: Secondary | ICD-10-CM

## 2015-09-07 DIAGNOSIS — R413 Other amnesia: Secondary | ICD-10-CM | POA: Diagnosis not present

## 2015-09-07 DIAGNOSIS — R269 Unspecified abnormalities of gait and mobility: Secondary | ICD-10-CM

## 2015-09-07 HISTORY — DX: Unspecified abnormalities of gait and mobility: R26.9

## 2015-09-07 MED ORDER — MEMANTINE HCL 28 X 5 MG & 21 X 10 MG PO TABS: ORAL_TABLET | ORAL | Status: DC

## 2015-09-07 NOTE — Patient Instructions (Signed)

## 2015-09-07 NOTE — Progress Notes (Signed)
Reason for visit:  Peripheral neuropathy  [name] is an 82 y.o. female  History of present illness:   [name] is a 82 year old right-handed white female with a history of a peripheral neuropathy. The patient has been on gabapentin with some benefit, she indicates that this does help her pain quite a bit. She has gotten off of the hydrocodone completely, she will take Tylenol on occasion for the pain. The patient was in the hospital in September 2016 with bradycardia and a urinary tract infection, she suffered a delirium state around that time. She has recovered from this. The patient continues to have some memory issues, but she could not tolerate Aricept previously. The patient returns for an evaluation. She denies any falls, she has good gait stability. She does not use a cane for ambulation. She continues to have some memory issues.  Past Medical History  Diagnosis Date  . Hypertension   . Hyperlipidemia   . GERD (gastroesophageal reflux disease)   . Anxiety   . Mitral prolapse   . Degenerative arthritis   . Neuropathy ([place])   . Peripheral neuropathy (HCC)     Small fiber   . Diverticulosis   . Mild renal insufficiency   . Hx of echocardiogram 07/30/2012    EF 55-60%; mild LVH; mild AV stenosis & trivial regurg.; mod MV regurg.; LA severly dilated; RV systolic pressure increased consistent with severe pulm. hypertension; RA moderately dilated; mod TV regurg.;   . History of nuclear stress test 09/19/2007    normal pattern of perfusion; post-stress EF 86%; EKG negative for ischemia; low risk scan  . Carotid arterial disease ([place]) 07/30/2012    carotid doppler; normal study  . Foot fracture, left   . Memory disorder 03/05/2014  . Chronic anticoagulation, with coumadin, with PAF and CHADS2Vasc2 score of 4 09/16/2014  . S/P cardiac cath 09/14/14 mild calcification of ostial RCA but otherwise normal coronary arteries 09/14/2014  . Anemia, iron deficiency 09/16/2014  .  Abnormality of gait 09/07/2015    Past Surgical History  Procedure Laterality Date  . Abdominal hysterectomy    . Replacement total knee Right   . Tear duct probing with strabismus repair      Tear duct repair surgery  . Gallbladder resection    . Tonsillectomy    . Appendectomy    . Resuspension procedure    .  bilateral bunionectomies    . Foot fracture surgery Left   . Dilation and curettage of uterus    . Umbilical hernia repair    . Left heart catheterization with coronary angiogram N/A 09/14/2014    Procedure: LEFT HEART CATHETERIZATION WITH CORONARY ANGIOGRAM;  Surgeon: Troy Sine, MD;  Location: [facility] CATH LAB;  Service: Cardiovascular;  Laterality: N/A;    Family History  Problem Relation Age of Onset  . Pneumonia Mother   . Coronary artery disease Mother   . Heart disease Father   . Cancer Father   . Arthritis Sister   . Hypertension Mother   . Hypertension Father     Social history:  reports that she has quit smoking. She has never used smokeless tobacco. She reports that she does not drink alcohol or use illicit drugs.    Allergies  Allergen Reactions  . Avelox [Moxifloxacin Hcl In Nacl] Shortness Of Breath and Swelling  . Aricept [Donepezil Hcl] Diarrhea  . Codeine Swelling       . Garlic Other (See Comments)    Gi  problems  . Latex Hives, Itching, Rash and Other (See Comments)    Watery blisters   . Onion Other (See Comments)    Gi problems  . Sulfa Drugs Cross Reactors Swelling  . Polysporin [Bacitracin-Polymyxin B] Other (See Comments)    Medications:  Prior to Admission medications   Medication Sig Start Date End Date Taking? Authorizing Provider  atorvastatin (LIPITOR) 10 MG tablet TAKE ONE TABLET BY MOUTH ONCE DAILY 08/16/15  Yes Troy Sine, MD  carvedilol (COREG) 3.125 MG tablet Take one-half tablet by mouth twice daily 08/18/15  Yes Troy Sine, MD  cetirizine (ZYRTEC) 10 MG tablet Take 10 mg by mouth daily.   Yes Historical  Provider, MD  cholecalciferol (VITAMIN D) 1000 UNITS tablet Take 1,000 Units by mouth daily.     Yes Historical Provider, MD  colestipol (COLESTID) 1 G tablet Take 1 g by mouth 2 (two) times daily as needed. For diarrhea    Yes Historical Provider, MD  cycloSPORINE (RESTASIS) 0.05 % ophthalmic emulsion Place 1 drop into both eyes daily.     Yes Historical Provider, MD  diltiazem (CARDIZEM SR) 60 MG 12 hr capsule Take 1 capsule (60 mg total) by mouth every 12 (twelve) hours. 05/20/15  Yes Troy Sine, MD  fenofibrate micronized (LOFIBRA) 134 MG capsule Take 1 capsule (134 mg total) by mouth daily. 05/20/15  Yes Troy Sine, MD  Flaxseed, Linseed, (FLAXSEED OIL) 1000 MG CAPS Take 1,000-2,000 mg by mouth 2 (two) times daily. 2 @ lunch and 1 @ dinner   Yes Historical Provider, MD  flunisolide (NASALIDE) 0.025 % SOLN Inhale 1 spray into the lungs daily.     Yes Historical Provider, MD  gabapentin (NEURONTIN) 300 MG capsule TAKE 1 CAPSULE THREE TIMES DAILY AND 3 CAPSULES AT BEDTIME Patient taking differently: TAKE 1 CAPSULE(300MG ) THREE TIMES DAILY AND 3 CAPSULES (900 MG) AT BEDTIME 03/22/15  Yes Kathrynn Ducking, MD  hydrALAZINE (APRESOLINE) 25 MG tablet Take 1 tablet (25 mg total) by mouth 3 (three) times daily. 05/20/15  Yes Troy Sine, MD  Lactobacillus (ACIDOPHILUS) 100 MG CAPS Take 100 mg by mouth daily.    Yes Historical Provider, MD  loperamide (IMODIUM) 2 MG capsule Take 1 capsule (2 mg total) by mouth 2 (two) times daily as needed for diarrhea or loose stools. 05/12/15  Yes Domenic Polite, MD  Multiple Vitamin (MULTIVITAMIN) tablet Take 1 tablet by mouth daily.     Yes Historical Provider, MD  Multiple Vitamins-Minerals (VISION FORMULA/LUTEIN) TABS Take 1 tablet by mouth daily.   Yes Historical Provider, MD  pyridOXINE (VITAMIN B-6) 100 MG tablet Take 100 mg by mouth daily.   Yes Historical Provider, MD  ranitidine (ZANTAC) 150 MG tablet Take 150 mg by mouth 2 (two) times daily. 08/20/15  Yes  Historical Provider, MD  simethicone (MYLICON) 0000000 MG chewable tablet Chew 125 mg by mouth as needed for flatulence.   Yes Historical Provider, MD  vitamin B-12 (CYANOCOBALAMIN) 1000 MCG tablet Take 1,000 mcg by mouth daily.   Yes Historical Provider, MD  warfarin (COUMADIN) 2 MG tablet TAKE 1 AND 1/2 TO 2 TABLETS BY MOUTH DAILY AS DIRECTED BY COUMADIN CLINIC 09/05/15  Yes Troy Sine, MD    ROS:  Out of a complete 14 system review of symptoms, the patient complains only of the following symptoms, and all other reviewed systems are negative.   Ringing in the ears  Leg swelling  Food allergies  Joint pain, back  pain  Memory problems  Blood pressure 154/77, pulse 78, height 5' 2.5" (1.588 m), weight 121 lb 8 oz (55.112 kg).  Physical Exam  General: The patient is alert and cooperative at the time of the examination.  Skin: No significant peripheral edema is noted.   Neurologic Exam  Mental status: The patient is alert and oriented x 3 at the time of the examination. The patient has apparent normal recent and remote memory, with an apparently normal attention span and concentration ability. Mini-Mental status examination done today shows a total score of 30/30. The patient is able to name 11 animals in 30 seconds.   Cranial nerves: Facial symmetry is present. Speech is normal, no aphasia or dysarthria is noted. Extraocular movements are full. Visual fields are full.  Motor: The patient has good strength in all 4 extremities.  Sensory examination: Soft touch sensation is symmetric on the face, arms, and legs.  Coordination: The patient has good finger-nose-finger and heel-to-shin bilaterally.  Gait and station: The patient has a slightly wide-based gait. The patient is able to walk independently. Tandem gait was not tested. Romberg is negative. No drift is seen.  Reflexes: Deep tendon reflexes are symmetric, but are depressed.   Assessment/Plan:   1. Mild memory  disturbance   2. Peripheral neuropathy   3. Mild gait disturbance   The patient was not able to tolerate Aricept, she will be given a trial on Namenda at this time. A prescription was called in. She is doing well on gabapentin, we will continue the current dose. She will follow-up in 6 months, sooner if needed. She will contact me one month if she is tolerating the Namenda, we will call in a maintenance dose prescription for her at that time.  Jill Alexanders MD 09/07/2015 7:48 PM  [facility] [address] [place], [place] [address]  Phone [phone] Fax [phone]

## 2015-09-08 DIAGNOSIS — R1904 Left lower quadrant abdominal swelling, mass and lump: Secondary | ICD-10-CM | POA: Diagnosis not present

## 2015-09-08 DIAGNOSIS — N83202 Unspecified ovarian cyst, left side: Secondary | ICD-10-CM | POA: Diagnosis not present

## 2015-09-14 ENCOUNTER — Ambulatory Visit (INDEPENDENT_AMBULATORY_CARE_PROVIDER_SITE_OTHER): Payer: Medicare Other | Admitting: Podiatry

## 2015-09-14 ENCOUNTER — Encounter: Payer: Self-pay | Admitting: Podiatry

## 2015-09-14 DIAGNOSIS — M79676 Pain in unspecified toe(s): Secondary | ICD-10-CM | POA: Diagnosis not present

## 2015-09-14 DIAGNOSIS — M79674 Pain in right toe(s): Secondary | ICD-10-CM

## 2015-09-14 DIAGNOSIS — M79675 Pain in left toe(s): Secondary | ICD-10-CM

## 2015-09-14 DIAGNOSIS — B351 Tinea unguium: Secondary | ICD-10-CM

## 2015-09-14 NOTE — Progress Notes (Signed)
Patient ID: [name], female   DOB: 05/08/1922, 82 y.o.   MRN: [id number] Complaint:  Visit Type: Patient returns to my office for continued preventative foot care services. Complaint: Patient states" my nails have grown long and thick and become painful to walk and wear shoes" Patient has been diagnosed with neuropathy.  . She presents to the office for preventive foot care services and says her ulcer hal healed with no pain or drainage.  .   Podiatric Exam: Vascular: dorsalis pedis and posterior tibial pulses are palpable bilateral. Capillary return is immediate. Temperature gradient is WNL. Skin turgor WNL  Sensorium: Normal Semmes Weinstein monofilament test. Normal tactile sensation bilaterally. Nail Exam: Pt has thick disfigured discolored nails with subungual debris noted bilateral entire nail hallux through fifth toes both feet. Ulcer Exam: There is no evidence of ulcer or pre-ulcerative changes or infection. Orthopedic Exam: Muscle tone and strength are WNL. No limitations in general ROM. No crepitus or effusions noted. Foot type and digits show no abnormalities. Bony prominences are unremarkable.Severe dorsiflexed second toe right foot. Skin: No Porokeratosis. No infection or ulcers  Diagnosis:  Tinea unguium, Pain in right toe, pain in left toes  Treatment & Plan Procedures and Treatment: Consent by patient was obtained for treatment procedures. The patient understood the discussion of treatment and procedures well. All questions were answered thoroughly reviewed. Debridement of mycotic and hypertrophic toenails, 1 through 5 bilateral and clearing of subungual debris. No ulceration, no infection noted. Told her to cut hole in shoe right to relieve pressure on toe. Return Visit-Office Procedure: Patient instructed to return to the office for a follow up visit 10 weeks for continued evaluation and treatment. 

## 2015-09-22 ENCOUNTER — Ambulatory Visit (INDEPENDENT_AMBULATORY_CARE_PROVIDER_SITE_OTHER): Payer: Medicare Other | Admitting: Pharmacist Clinician (PhC)/ Clinical Pharmacy Specialist

## 2015-09-22 DIAGNOSIS — Z7901 Long term (current) use of anticoagulants: Secondary | ICD-10-CM

## 2015-09-22 DIAGNOSIS — I480 Paroxysmal atrial fibrillation: Secondary | ICD-10-CM | POA: Diagnosis not present

## 2015-09-22 LAB — PROTIME-INR: INR: 3.1 — AB (ref 0.9–1.1)

## 2015-10-05 DIAGNOSIS — R197 Diarrhea, unspecified: Secondary | ICD-10-CM | POA: Diagnosis not present

## 2015-10-12 DIAGNOSIS — H40013 Open angle with borderline findings, low risk, bilateral: Secondary | ICD-10-CM | POA: Diagnosis not present

## 2015-10-12 DIAGNOSIS — H35412 Lattice degeneration of retina, left eye: Secondary | ICD-10-CM | POA: Diagnosis not present

## 2015-10-12 DIAGNOSIS — H2513 Age-related nuclear cataract, bilateral: Secondary | ICD-10-CM | POA: Diagnosis not present

## 2015-10-12 DIAGNOSIS — H04123 Dry eye syndrome of bilateral lacrimal glands: Secondary | ICD-10-CM | POA: Diagnosis not present

## 2015-10-20 ENCOUNTER — Ambulatory Visit (INDEPENDENT_AMBULATORY_CARE_PROVIDER_SITE_OTHER): Payer: Medicare Other | Admitting: Pharmacist Clinician (PhC)/ Clinical Pharmacy Specialist

## 2015-10-20 DIAGNOSIS — Z7901 Long term (current) use of anticoagulants: Secondary | ICD-10-CM | POA: Diagnosis not present

## 2015-10-20 DIAGNOSIS — I480 Paroxysmal atrial fibrillation: Secondary | ICD-10-CM | POA: Diagnosis not present

## 2015-10-20 LAB — PROTIME-INR: INR: 2.6 — AB (ref 0.9–1.1)

## 2015-10-26 ENCOUNTER — Telehealth: Payer: Self-pay | Admitting: Neurology

## 2015-10-26 MED ORDER — MEMANTINE HCL 5 MG PO TABS: ORAL_TABLET | ORAL | Status: DC

## 2015-10-26 NOTE — Telephone Encounter (Signed)
I called the patient. On Namenda 10 mg twice a day dosing, she feels somewhat spacey, as if she is not truly present in the room. She felt better on the lower doses. I will start back at the 5 mg tablets taking 1 in the morning, 2 in the evening, if she does well for several weeks, we can try going back up to 10 mg twice a day later.

## 2015-10-26 NOTE — Telephone Encounter (Signed)
Pt called and says she does not really know if memantine (NAMENDA TITRATION PAK) tablet pack is working for her. She says she is feeling like she is "saying and doing the right things but doesn't feel really into it." She says she does not feel like she is present while with company. She responds more on automatic but does not feel like she is not there. Not feeling her self. She started the fourth week this past Saturday and this is when she started to feel this way. Please call and advise [phone] until 10:30 today, will be back by 4p.

## 2015-10-29 ENCOUNTER — Telehealth: Payer: Self-pay | Admitting: Diagnostic Neuroimaging

## 2015-10-29 MED ORDER — MEMANTINE HCL 5 MG PO TABS: ORAL_TABLET | ORAL | Status: DC

## 2015-10-29 NOTE — Telephone Encounter (Signed)
Pt needed rx sent to local pharmacy. rx sent in for lower memantine.   Penni Bombard, MD [id number], [id number] AM  Certified in Neurology, Neurophysiology and Neuroimaging  [facility] [address] [place], [place] [address] [phone]

## 2015-10-31 ENCOUNTER — Telehealth: Payer: Self-pay | Admitting: *Deleted

## 2015-10-31 NOTE — Telephone Encounter (Signed)
Pt needs PA on memantine (NAMENDA) 5 MG tablet. May send to [facility]

## 2015-10-31 NOTE — Telephone Encounter (Signed)
LVM requesting call back for new insurance information in order to complete PA on medication. Left name, number.

## 2015-11-01 ENCOUNTER — Encounter: Payer: Self-pay | Admitting: *Deleted

## 2015-11-01 NOTE — Telephone Encounter (Signed)
Pt returned MaryClare's phone call. Insurance is [employer] effective 08/21/15  ID# [id number]  BIN# [id number]  GRP# [id number]  She is requesting the RX for namenda be sent to [place].

## 2015-11-03 ENCOUNTER — Other Ambulatory Visit: Payer: Self-pay | Admitting: Neurology

## 2015-11-11 DIAGNOSIS — Z85828 Personal history of other malignant neoplasm of skin: Secondary | ICD-10-CM | POA: Diagnosis not present

## 2015-11-11 DIAGNOSIS — L821 Other seborrheic keratosis: Secondary | ICD-10-CM | POA: Diagnosis not present

## 2015-11-17 ENCOUNTER — Ambulatory Visit (INDEPENDENT_AMBULATORY_CARE_PROVIDER_SITE_OTHER): Payer: Medicare Other | Admitting: Pharmacist Clinician (PhC)/ Clinical Pharmacy Specialist

## 2015-11-17 DIAGNOSIS — R05 Cough: Secondary | ICD-10-CM | POA: Diagnosis not present

## 2015-11-17 DIAGNOSIS — I480 Paroxysmal atrial fibrillation: Secondary | ICD-10-CM | POA: Diagnosis not present

## 2015-11-17 DIAGNOSIS — Z7901 Long term (current) use of anticoagulants: Secondary | ICD-10-CM

## 2015-11-17 LAB — PROTIME-INR: INR: 2.3 — AB (ref 0.9–1.1)

## 2015-11-19 ENCOUNTER — Telehealth: Payer: Self-pay | Admitting: Cardiology

## 2015-11-19 NOTE — Telephone Encounter (Signed)
Patient called stating that she was prescribed a Zpack and she is concerned with interaction with her coumadin.  Please set up appt for patient to be seen Monday for INR check.

## 2015-11-20 ENCOUNTER — Encounter (HOSPITAL_COMMUNITY): Payer: Self-pay

## 2015-11-20 ENCOUNTER — Emergency Department (HOSPITAL_COMMUNITY): Payer: Medicare Other

## 2015-11-20 ENCOUNTER — Emergency Department (HOSPITAL_COMMUNITY)
Admission: EM | Admit: 2015-11-20 | Discharge: 2015-11-20 | Disposition: A | Discharge status: Home / Self Care | Payer: Medicare Other | Attending: Emergency Medicine | Admitting: Emergency Medicine

## 2015-11-20 DIAGNOSIS — Z9889 Other specified postprocedural states: Secondary | ICD-10-CM | POA: Insufficient documentation

## 2015-11-20 DIAGNOSIS — E785 Hyperlipidemia, unspecified: Secondary | ICD-10-CM | POA: Diagnosis not present

## 2015-11-20 DIAGNOSIS — R079 Chest pain, unspecified: Secondary | ICD-10-CM | POA: Diagnosis not present

## 2015-11-20 DIAGNOSIS — R067 Sneezing: Secondary | ICD-10-CM | POA: Insufficient documentation

## 2015-11-20 DIAGNOSIS — R062 Wheezing: Secondary | ICD-10-CM | POA: Insufficient documentation

## 2015-11-20 DIAGNOSIS — M1990 Unspecified osteoarthritis, unspecified site: Secondary | ICD-10-CM | POA: Insufficient documentation

## 2015-11-20 DIAGNOSIS — K219 Gastro-esophageal reflux disease without esophagitis: Secondary | ICD-10-CM | POA: Diagnosis not present

## 2015-11-20 DIAGNOSIS — Z87891 Personal history of nicotine dependence: Secondary | ICD-10-CM | POA: Insufficient documentation

## 2015-11-20 DIAGNOSIS — G629 Polyneuropathy, unspecified: Secondary | ICD-10-CM | POA: Diagnosis not present

## 2015-11-20 DIAGNOSIS — Z8781 Personal history of (healed) traumatic fracture: Secondary | ICD-10-CM | POA: Diagnosis not present

## 2015-11-20 DIAGNOSIS — Z862 Personal history of diseases of the blood and blood-forming organs and certain disorders involving the immune mechanism: Secondary | ICD-10-CM | POA: Diagnosis not present

## 2015-11-20 DIAGNOSIS — Z7901 Long term (current) use of anticoagulants: Secondary | ICD-10-CM | POA: Diagnosis not present

## 2015-11-20 DIAGNOSIS — Z79899 Other long term (current) drug therapy: Secondary | ICD-10-CM | POA: Diagnosis not present

## 2015-11-20 DIAGNOSIS — F419 Anxiety disorder, unspecified: Secondary | ICD-10-CM | POA: Diagnosis not present

## 2015-11-20 DIAGNOSIS — R059 Cough, unspecified: Secondary | ICD-10-CM

## 2015-11-20 DIAGNOSIS — I10 Essential (primary) hypertension: Secondary | ICD-10-CM | POA: Diagnosis not present

## 2015-11-20 DIAGNOSIS — R05 Cough: Secondary | ICD-10-CM | POA: Insufficient documentation

## 2015-11-20 DIAGNOSIS — Z91040 Latex allergy status: Secondary | ICD-10-CM | POA: Diagnosis not present

## 2015-11-20 DIAGNOSIS — J029 Acute pharyngitis, unspecified: Secondary | ICD-10-CM | POA: Insufficient documentation

## 2015-11-20 DIAGNOSIS — Z87448 Personal history of other diseases of urinary system: Secondary | ICD-10-CM | POA: Diagnosis not present

## 2015-11-20 LAB — URINE MICROSCOPIC-ADD ON: Bacteria, UA: NONE SEEN

## 2015-11-20 LAB — COMPREHENSIVE METABOLIC PANEL
ALT: 18 U/L (ref 14–54)
AST: 32 U/L (ref 15–41)
Albumin: 4.2 g/dL (ref 3.5–5.0)
Alkaline Phosphatase: 40 U/L (ref 38–126)
Anion gap: 10 (ref 5–15)
BUN: 12 mg/dL (ref 6–20)
CO2: 25 mmol/L (ref 22–32)
Calcium: 9.7 mg/dL (ref 8.9–10.3)
Chloride: 106 mmol/L (ref 101–111)
Creatinine, Ser: 0.58 mg/dL (ref 0.44–1.00)
GFR calc Af Amer: >60 mL/min (ref >60)
GFR calc non Af Amer: >60 mL/min (ref >60)
Glucose, Bld: 89 mg/dL (ref 65–99)
Potassium: 3.6 mmol/L (ref 3.5–5.1)
Sodium: 141 mmol/L (ref 135–145)
Total Bilirubin: 0.2 mg/dL — ABNORMAL LOW (ref 0.3–1.2)
Total Protein: 7.8 g/dL (ref 6.5–8.1)

## 2015-11-20 LAB — URINALYSIS, ROUTINE W REFLEX MICROSCOPIC
Bilirubin Urine: NEGATIVE
Glucose, UA: NEGATIVE mg/dL
Ketones, ur: NEGATIVE mg/dL
Leukocytes, UA: NEGATIVE
Nitrite: NEGATIVE
Protein, ur: NEGATIVE mg/dL
Specific Gravity, Urine: 1.008 (ref 1.005–1.030)
pH: 7.5 (ref 5.0–8.0)

## 2015-11-20 LAB — CBC WITH DIFFERENTIAL/PLATELET
Basophils Absolute: 0 10*3/uL (ref 0.0–0.1)
Basophils Relative: 1 %
Eosinophils Absolute: 0.1 10*3/uL (ref 0.0–0.7)
Eosinophils Relative: 2 %
HCT: 34.7 % — ABNORMAL LOW (ref 36.0–46.0)
Hemoglobin: 11.9 g/dL — ABNORMAL LOW (ref 12.0–15.0)
Lymphocytes Relative: 24 %
Lymphs Abs: 1 10*3/uL (ref 0.7–4.0)
MCH: 28.6 pg (ref 26.0–34.0)
MCHC: 34.3 g/dL (ref 30.0–36.0)
MCV: 83.4 fL (ref 78.0–100.0)
Monocytes Absolute: 0.4 10*3/uL (ref 0.1–1.0)
Monocytes Relative: 9 %
Neutro Abs: 2.6 10*3/uL (ref 1.7–7.7)
Neutrophils Relative %: 64 %
Platelets: 198 10*3/uL (ref 150–400)
RBC: 4.16 MIL/uL (ref 3.87–5.11)
RDW: 14.6 % (ref 11.5–15.5)
WBC: 4.1 10*3/uL (ref 4.0–10.5)

## 2015-11-20 LAB — I-STAT TROPONIN, ED: Troponin i, poc: 0.01 ng/mL (ref 0.00–0.08)

## 2015-11-20 LAB — PROTIME-INR
INR: 1.45 (ref 0.00–1.49)
Prothrombin Time: 17.7 seconds — ABNORMAL HIGH (ref 11.6–15.2)

## 2015-11-20 MED ORDER — BENZONATATE 100 MG PO CAPS
100.0000 mg | ORAL_CAPSULE | Freq: Three times a day (TID) | ORAL | Status: DC
Start: 2015-11-20 — End: 2016-03-14

## 2015-11-20 MED ORDER — SODIUM CHLORIDE 0.9 % IV BOLUS (SEPSIS)
500.0000 mL | Freq: Once | INTRAVENOUS | Status: AC
  Administered 2015-11-20: 500 mL via INTRAVENOUS

## 2015-11-20 NOTE — Discharge Instructions (Signed)
Take the prescribed medication as directed. Your INR was low today-- please follow-up later this week to have it re-checked. Follow-up with your primary care physician. Return to the ED for new or worsening symptoms.

## 2015-11-20 NOTE — ED Provider Notes (Signed)
CSN: [id number]     Arrival date & time 11/20/15  1057 History  By signing my name below, I, Dora Sims, attest that this documentation has been prepared under the direction and in the presence of non-physician practitioner, Quincy Carnes, PA-C. Electronically Signed: Dora Sims, Scribe. 11/20/2015. 1:03 PM.    Chief Complaint  Patient presents with  . Cough    The history is provided by the patient and a friend. No language interpreter was used.     HPI Comments: [name] is a 82 y.o. female with h/o GERD, HTN, HLD, and asthma who presents to the Emergency Department complaining of worsening productive cough with yellow-green sputum for the last month. Pt endorses associated wheezing, chest burning, and sore throat due to her cough. She also reports that she has had bad allergies for the last month and has been sneezing regularly. Pt saw her PCP three days ago and had x-rays and blood work taken; she was also given nasal spray for her allergies. She was prescribed a z-pack by her PCP yesterday and notes that her cough has worsened since taking those medications. She also notes that she experienced constant diarrhea last night and believes this was caused by her z-pack medication. She denies fever, vomiting, SOB, or any other associated symptoms. Pt has eaten minimally and had minimal fluids today.  VSS.  Past Medical History  Diagnosis Date  . Hypertension   . Hyperlipidemia   . GERD (gastroesophageal reflux disease)   . Anxiety   . Mitral prolapse   . Degenerative arthritis   . Neuropathy ([place])   . Peripheral neuropathy (HCC)     Small fiber   . Diverticulosis   . Mild renal insufficiency   . Hx of echocardiogram 07/30/2012    EF 55-60%; mild LVH; mild AV stenosis & trivial regurg.; mod MV regurg.; LA severly dilated; RV systolic pressure increased consistent with severe pulm. hypertension; RA moderately dilated; mod TV regurg.;   . History of nuclear stress test  09/19/2007    normal pattern of perfusion; post-stress EF 86%; EKG negative for ischemia; low risk scan  . Carotid arterial disease ([place]) 07/30/2012    carotid doppler; normal study  . Foot fracture, left   . Memory disorder 03/05/2014  . Chronic anticoagulation, with coumadin, with PAF and CHADS2Vasc2 score of 4 09/16/2014  . S/P cardiac cath 09/14/14 mild calcification of ostial RCA but otherwise normal coronary arteries 09/14/2014  . Anemia, iron deficiency 09/16/2014  . Abnormality of gait 09/07/2015   Past Surgical History  Procedure Laterality Date  . Abdominal hysterectomy    . Replacement total knee Right   . Tear duct probing with strabismus repair      Tear duct repair surgery  . Gallbladder resection    . Tonsillectomy    . Appendectomy    . Resuspension procedure    .  bilateral bunionectomies    . Foot fracture surgery Left   . Dilation and curettage of uterus    . Umbilical hernia repair    . Left heart catheterization with coronary angiogram N/A 09/14/2014    Procedure: LEFT HEART CATHETERIZATION WITH CORONARY ANGIOGRAM;  Surgeon: Troy Sine, MD;  Location: [facility] CATH LAB;  Service: Cardiovascular;  Laterality: N/A;   Family History  Problem Relation Age of Onset  . Pneumonia Mother   . Coronary artery disease Mother   . Heart disease Father   . Cancer Father   . Arthritis Sister   .  Hypertension Mother   . Hypertension Father    Social History  Substance Use Topics  . Smoking status: Former Research scientist (life sciences)  . Smokeless tobacco: Never Used  . Alcohol Use: No   OB History    No data available     Review of Systems  Constitutional: Negative for fever.  HENT: Positive for sneezing and sore throat.   Respiratory: Positive for cough and wheezing. Negative for shortness of breath.   Cardiovascular: Positive for chest pain ("chest burning").  Gastrointestinal: Negative for vomiting.  All other systems reviewed and are negative.     Allergies  Avelox; Aricept;  Codeine; Garlic; Latex; Onion; Sulfa drugs cross reactors; and Polysporin  Home Medications   Prior to Admission medications   Medication Sig Start Date End Date Taking? Authorizing Provider  atorvastatin (LIPITOR) 10 MG tablet TAKE ONE TABLET BY MOUTH ONCE DAILY 08/16/15   Troy Sine, MD  carvedilol (COREG) 3.125 MG tablet Take one-half tablet by mouth twice daily 08/18/15   Troy Sine, MD  cetirizine (ZYRTEC) 10 MG tablet Take 10 mg by mouth daily.    Historical Provider, MD  cholecalciferol (VITAMIN D) 1000 UNITS tablet Take 1,000 Units by mouth daily.      Historical Provider, MD  colestipol (COLESTID) 1 G tablet Take 1 g by mouth 2 (two) times daily as needed. For diarrhea     Historical Provider, MD  cycloSPORINE (RESTASIS) 0.05 % ophthalmic emulsion Place 1 drop into both eyes daily.      Historical Provider, MD  diltiazem (CARDIZEM SR) 60 MG 12 hr capsule Take 1 capsule (60 mg total) by mouth every 12 (twelve) hours. 05/20/15   Troy Sine, MD  fenofibrate micronized (LOFIBRA) 134 MG capsule Take 1 capsule (134 mg total) by mouth daily. 05/20/15   Troy Sine, MD  Flaxseed, Linseed, (FLAXSEED OIL) 1000 MG CAPS Take 1,000-2,000 mg by mouth 2 (two) times daily. 2 @ lunch and 1 @ dinner    Historical Provider, MD  flunisolide (NASALIDE) 0.025 % SOLN Inhale 1 spray into the lungs daily.      Historical Provider, MD  gabapentin (NEURONTIN) 300 MG capsule TAKE 1 CAPSULE THREE TIMES DAILY AND 3 CAPSULES AT BEDTIME 11/03/15   Asencion Partridge Dohmeier, MD  hydrALAZINE (APRESOLINE) 25 MG tablet Take 1 tablet (25 mg total) by mouth 3 (three) times daily. 05/20/15   Troy Sine, MD  Lactobacillus (ACIDOPHILUS) 100 MG CAPS Take 100 mg by mouth daily.     Historical Provider, MD  loperamide (IMODIUM) 2 MG capsule Take 1 capsule (2 mg total) by mouth 2 (two) times daily as needed for diarrhea or loose stools. 05/12/15   Domenic Polite, MD  memantine (NAMENDA) 5 MG tablet 1 tablet in the morning, 2 in  the evening 10/29/15   Penni Bombard, MD  Multiple Vitamin (MULTIVITAMIN) tablet Take 1 tablet by mouth daily.      Historical Provider, MD  Multiple Vitamins-Minerals (VISION FORMULA/LUTEIN) TABS Take 1 tablet by mouth daily.    Historical Provider, MD  pyridOXINE (VITAMIN B-6) 100 MG tablet Take 100 mg by mouth daily.    Historical Provider, MD  ranitidine (ZANTAC) 150 MG tablet Take 150 mg by mouth 2 (two) times daily. 08/20/15   Historical Provider, MD  simethicone (MYLICON) 0000000 MG chewable tablet Chew 125 mg by mouth as needed for flatulence.    Historical Provider, MD  warfarin (COUMADIN) 2 MG tablet TAKE 1 AND 1/2 TO 2 TABLETS BY  MOUTH DAILY AS DIRECTED BY COUMADIN CLINIC 09/05/15   Troy Sine, MD   BP 199/95 mmHg  Pulse 86  Temp(Src) 97.6 F (36.4 C) (Oral)  Resp 17  SpO2 99%   Physical Exam  Constitutional: She is oriented to person, place, and time. She appears well-developed and well-nourished. No distress.  Elderly, overall appears well  HENT:  Head: Normocephalic and atraumatic.  Right Ear: Tympanic membrane and ear canal normal.  Left Ear: Tympanic membrane and ear canal normal.  Nose: Nose normal.  Mouth/Throat: Uvula is midline, oropharynx is clear and moist and mucous membranes are normal. No oropharyngeal exudate, posterior oropharyngeal edema or posterior oropharyngeal erythema.  Eyes: Conjunctivae and EOM are normal. Pupils are equal, round, and reactive to light.  Neck: Normal range of motion. Neck supple.  Cardiovascular: Normal rate, regular rhythm and normal heart sounds.   Pulmonary/Chest: Effort normal and breath sounds normal. No respiratory distress. She has no wheezes.  Abdominal: Soft. Bowel sounds are normal.  Musculoskeletal: Normal range of motion.  Neurological: She is alert and oriented to person, place, and time.  Skin: Skin is warm and dry. She is not diaphoretic.  Psychiatric: She has a normal mood and affect.  Nursing note and vitals  reviewed.   ED Course  Procedures (including critical care time)  DIAGNOSTIC STUDIES: Oxygen Saturation is 99% on RA, normal by my interpretation.    COORDINATION OF CARE: 1:04 PM Discussed treatment plan with pt at bedside and pt agreed to plan.  Labs Review Labs Reviewed  CBC WITH DIFFERENTIAL/PLATELET - Abnormal; Notable for the following:    Hemoglobin 11.9 (*)    HCT 34.7 (*)    All other components within normal limits  URINALYSIS, ROUTINE W REFLEX MICROSCOPIC (NOT AT [facility]) - Abnormal; Notable for the following:    Hgb urine dipstick SMALL (*)    All other components within normal limits  COMPREHENSIVE METABOLIC PANEL - Abnormal; Notable for the following:    Total Bilirubin 0.2 (*)    All other components within normal limits  PROTIME-INR - Abnormal; Notable for the following:    Prothrombin Time 17.7 (*)    All other components within normal limits  URINE MICROSCOPIC-ADD ON - Abnormal; Notable for the following:    Squamous Epithelial / LPF 0-5 (*)    All other components within normal limits  URINE CULTURE  I-STAT TROPOININ, ED    Imaging Review Dg Chest 2 View  11/20/2015  CLINICAL DATA:  Hacking cough for 1 month, persistent cough today, hypertension, former smoker, hyperlipidemia, pulmonary hypertension, prior MI EXAM: CHEST  2 VIEW COMPARISON:  11/17/2015 FINDINGS: Upper normal size of cardiac silhouette. Atherosclerotic calcification aorta. Small hiatal hernia. Lungs clear. No pleural effusion or pneumothorax. Bones demineralized. IMPRESSION: No acute abnormalities. Electronically Signed   By: Lavonia Dana M.D.   On: 11/20/2015 14:06   I have personally reviewed and evaluated these images and lab results as part of my medical decision-making.   EKG Interpretation None      MDM   Final diagnoses:  Cough   82 year old female here with cough over the past month. Intermittently productive with yellow/green sputum. No fever or chills. Patient is afebrile,  nontoxic. Started on azithromycin yesterday but reports this is causing her some diarrhea. She has not taken any further doses. EKG without acute ischemic changes. Chest x-ray is clear. Labwork is reassuring. Her INR is subtherapeutic. She has an appointment to recheck INR later this week.  Doubt acute  PE given nature of her symptoms.  Patient has not had any diarrhea here in the ED, doubt this is c.diff.  VS have remained stable.  No signs of respiratory distress.  Feel patient is stable for d/c home with outpatient treatment.  Rx tessalon to help with cough.  FU with PCP.  Discussed plan with patient, he/she acknowledged understanding and agreed with plan of care.  Return precautions given for new or worsening symptoms.  Case discussed with attending physician, Dr. Tamera Punt, who evaluated patient and agrees with assessment and plan of care.  I personally performed the services described in this documentation, which was scribed in my presence. The recorded information has been reviewed and is accurate.  Larene Pickett, PA-C 11/20/15 [place], PA-C 11/20/15 [place], MD 11/26/15 [phone]

## 2015-11-20 NOTE — ED Notes (Signed)
[name] [phone].

## 2015-11-20 NOTE — ED Notes (Signed)
She states she has had a hacky cough x 1 month.  Saw her pcp two days ago for same and underwent unremarkable diagnostics.  She, via phone reported cough getting worse to her pcp yesterday, who prescribed Azithromycin.  Here today with c/o persistent cough.  She arrives in no distress.

## 2015-11-21 ENCOUNTER — Telehealth: Payer: Self-pay | Admitting: Neurology

## 2015-11-21 ENCOUNTER — Other Ambulatory Visit: Payer: Self-pay | Admitting: *Deleted

## 2015-11-21 LAB — URINE CULTURE: Culture: 8000

## 2015-11-21 NOTE — Telephone Encounter (Signed)
Pt said she was in the ED yesterday 11/20/15 for wheezing and cough. Said chest xray was normal and labs were normal. sts she remembers reading "scare sheet" for memantine (NAMENDA) 5 MG tablet and a side effect can be cough and wheezing. She has been taking this medication for about 1 month. She said about the same time she started with a dry cough and the wheezing started last week. She is having tightness of the chest and throat, unusual hoarseness also

## 2015-11-21 NOTE — Telephone Encounter (Signed)
I called patient. The patient has had some coughing over the last month, correlates with when she started Namenda. We will try coming off the medication and see if the cough gets better. If it does not, she may get back on the drug.

## 2015-11-23 DIAGNOSIS — J4530 Mild persistent asthma, uncomplicated: Secondary | ICD-10-CM | POA: Diagnosis not present

## 2015-11-23 MED ORDER — ATORVASTATIN CALCIUM 10 MG PO TABS: 10.0000 mg | ORAL_TABLET | Freq: Every day | ORAL | Status: DC

## 2015-11-23 NOTE — Telephone Encounter (Signed)
Rx(s) sent to pharmacy electronically.  

## 2015-11-24 ENCOUNTER — Ambulatory Visit (INDEPENDENT_AMBULATORY_CARE_PROVIDER_SITE_OTHER): Payer: Medicare Other | Admitting: Pharmacist Clinician (PhC)/ Clinical Pharmacy Specialist

## 2015-11-24 DIAGNOSIS — Z7901 Long term (current) use of anticoagulants: Secondary | ICD-10-CM | POA: Diagnosis not present

## 2015-11-24 DIAGNOSIS — I480 Paroxysmal atrial fibrillation: Secondary | ICD-10-CM | POA: Diagnosis not present

## 2015-11-24 LAB — PROTIME-INR: INR: 1.4 — AB (ref 0.9–1.1)

## 2015-11-28 DIAGNOSIS — M810 Age-related osteoporosis without current pathological fracture: Secondary | ICD-10-CM | POA: Diagnosis not present

## 2015-11-29 DIAGNOSIS — N83202 Unspecified ovarian cyst, left side: Secondary | ICD-10-CM | POA: Diagnosis not present

## 2015-11-30 DIAGNOSIS — Z1212 Encounter for screening for malignant neoplasm of rectum: Secondary | ICD-10-CM | POA: Diagnosis not present

## 2015-11-30 DIAGNOSIS — Z01419 Encounter for gynecological examination (general) (routine) without abnormal findings: Secondary | ICD-10-CM | POA: Diagnosis not present

## 2015-11-30 DIAGNOSIS — I10 Essential (primary) hypertension: Secondary | ICD-10-CM | POA: Diagnosis not present

## 2015-11-30 DIAGNOSIS — M8580 Other specified disorders of bone density and structure, unspecified site: Secondary | ICD-10-CM | POA: Diagnosis not present

## 2015-12-01 ENCOUNTER — Ambulatory Visit (INDEPENDENT_AMBULATORY_CARE_PROVIDER_SITE_OTHER): Payer: Medicare Other | Admitting: Podiatry

## 2015-12-01 ENCOUNTER — Encounter: Payer: Self-pay | Admitting: Podiatry

## 2015-12-01 DIAGNOSIS — B351 Tinea unguium: Secondary | ICD-10-CM

## 2015-12-01 DIAGNOSIS — M79675 Pain in left toe(s): Secondary | ICD-10-CM

## 2015-12-01 DIAGNOSIS — M79676 Pain in unspecified toe(s): Secondary | ICD-10-CM

## 2015-12-01 DIAGNOSIS — I480 Paroxysmal atrial fibrillation: Secondary | ICD-10-CM | POA: Diagnosis not present

## 2015-12-01 DIAGNOSIS — Z7901 Long term (current) use of anticoagulants: Secondary | ICD-10-CM | POA: Diagnosis not present

## 2015-12-01 LAB — PROTIME-INR: INR: 2.2 — AB (ref 0.9–1.1)

## 2015-12-01 NOTE — Progress Notes (Signed)
Patient ID: [name], female   DOB: Jan 15, 1922, 82 y.o.   MRN: [id number] Complaint:  Visit Type: Patient returns to my office for continued preventative foot care services. Complaint: Patient states" my nails have grown long and thick and become painful to walk and wear shoes" Patient has been diagnosed with neuropathy.  . She presents to the office for preventive foot care services and says her ulcer hal healed with no pain or drainage.  .   Podiatric Exam: Vascular: dorsalis pedis and posterior tibial pulses are palpable bilateral. Capillary return is immediate. Temperature gradient is WNL. Skin turgor WNL  Sensorium: Normal Semmes Weinstein monofilament test. Normal tactile sensation bilaterally. Nail Exam: Pt has thick disfigured discolored nails with subungual debris noted bilateral entire nail hallux through fifth toes both feet. Ulcer Exam: There is no evidence of ulcer or pre-ulcerative changes or infection. Orthopedic Exam: Muscle tone and strength are WNL. No limitations in general ROM. No crepitus or effusions noted. Foot type and digits show no abnormalities. Bony prominences are unremarkable.Severe dorsiflexed second toe right foot. Skin: No Porokeratosis. No infection or ulcers  Diagnosis:  Tinea unguium, Pain in right toe, pain in left toes  Treatment & Plan Procedures and Treatment: Consent by patient was obtained for treatment procedures. The patient understood the discussion of treatment and procedures well. All questions were answered thoroughly reviewed. Debridement of mycotic and hypertrophic toenails, 1 through 5 bilateral and clearing of subungual debris. No ulceration, no infection noted. Told her to cut hole in shoe right to relieve pressure on toe. Return Visit-Office Procedure: Patient instructed to return to the office for a follow up visit 10 weeks for continued evaluation and treatment.   Gardiner Barefoot DPM

## 2015-12-02 ENCOUNTER — Ambulatory Visit (INDEPENDENT_AMBULATORY_CARE_PROVIDER_SITE_OTHER): Payer: Medicare Other | Admitting: Pharmacist Clinician (PhC)/ Clinical Pharmacy Specialist

## 2015-12-02 ENCOUNTER — Telehealth: Payer: Self-pay | Admitting: Cardiovascular Disease

## 2015-12-02 ENCOUNTER — Other Ambulatory Visit: Payer: Self-pay | Admitting: Cardiovascular Disease

## 2015-12-02 DIAGNOSIS — I480 Paroxysmal atrial fibrillation: Secondary | ICD-10-CM

## 2015-12-02 DIAGNOSIS — Z7901 Long term (current) use of anticoagulants: Secondary | ICD-10-CM

## 2015-12-02 NOTE — Telephone Encounter (Signed)
New message      Pt c/o medication issue:  1. Name of Medication: atorvastatin 2. How are you currently taking this medication (dosage and times per day)?   3. Are you having a reaction (difficulty breathing--STAT)? no 4. What is your medication issue? Pt states that when she picked up her presc at the pharmacy it was for 20mg .  She usually takes 10mg .  Did the doctor change her dosage?  Please call

## 2015-12-02 NOTE — Telephone Encounter (Signed)
Called patient to clarify dosage of atorvastatin. Patient said that she picked her rx up from [facility] at [place]. Patient advised that our records indicated that her rx was sent to [place] on 11/23/15 for atorvastatin 10 mg. Nurse asked patient to look at her bottle to confirm doctors name on the bottle. Patient confirmed that Dr. Pennie Banter office sent the atorvastatin 20 mg. Patient advised that she can use the 20 mg tablets by breaking them in half daily. Patient verbalized understanding of plan.

## 2015-12-02 NOTE — Telephone Encounter (Signed)
REFILL 

## 2015-12-07 DIAGNOSIS — R197 Diarrhea, unspecified: Secondary | ICD-10-CM | POA: Diagnosis not present

## 2015-12-14 ENCOUNTER — Telehealth: Payer: Self-pay | Admitting: Cardiovascular Disease

## 2015-12-14 MED ORDER — DILTIAZEM HCL ER 60 MG PO CP12: 60.0000 mg | ORAL_CAPSULE | Freq: Two times a day (BID) | ORAL | Status: DC

## 2015-12-14 NOTE — Telephone Encounter (Signed)
New message     Pt c/o medication issue:  1. Name of Medication:  diltiazem 2. How are you currently taking this medication (dosage and times per day)?  3. Are you having a reaction (difficulty breathing--STAT)? no 4. What is your medication issue? Pt has 2 bottles of medication.  One says 60mg  take 1 cap every 12 hrs; and the other bottle says 180mg  take 1 cap daily.  Which one is she to be taking?

## 2015-12-14 NOTE — Telephone Encounter (Signed)
SPOKE TO DR Claiborne Billings, PATIENT CAN TAKE DILTIAZEM 60 MG  EVERY 12 HOUR  AND NOT 180MG   DISCONTINUE  TAKE ATORVASTATIN 10 MG  LEFT MESSAGE ON PATIENT SECURED ANSWER MACHINE

## 2015-12-14 NOTE — Telephone Encounter (Signed)
SPOKE TO PATIENT  PATIENT STATES SHE HAS 2 BOTTLES  OF DILTIAZEM  ONE FOR 60 MG EVERY 12 HOURS THAT WAS FILLED 09/05/15 DILTIAZEM FOR 180 MG DAILY THATT WAS FILLED  10/18/15  PATIENT STATES  HAS BEEN FOLLOWING DISCHARGE INSTRUCTION . WHICH STATES TO TAKE 60 MG EVERY 12 HOURS LAST OFFICE NOTES 07/2015 --WITH  DR Claiborne Billings  MEDICATION WAS 60 MG EVERY 12 HOURS.   ALSO PATIENT WANTED TO KNOW IF SHE SHOULD BE TAKING ATORVASTATIN  10 MG FROM DR KELLY ,BUT HAS PRESCRIPTION FROM DR PHARR ,PRIMARY FOR ATORVASTATIN 20MG  PATIENT WOULD LIKE TO KNOW WHICH ONE TO TAKE.  PATIENT AWARE WILL HAVE TO DEFER TO DR Claiborne Billings AND CONTACT HER BCK

## 2015-12-15 ENCOUNTER — Telehealth: Payer: Self-pay | Admitting: Cardiovascular Disease

## 2015-12-15 ENCOUNTER — Ambulatory Visit (INDEPENDENT_AMBULATORY_CARE_PROVIDER_SITE_OTHER): Payer: Medicare Other | Admitting: Pharmacist Clinician (PhC)/ Clinical Pharmacy Specialist

## 2015-12-15 DIAGNOSIS — Z7901 Long term (current) use of anticoagulants: Secondary | ICD-10-CM

## 2015-12-15 DIAGNOSIS — I480 Paroxysmal atrial fibrillation: Secondary | ICD-10-CM

## 2015-12-15 LAB — POCT INR: INR: 1.86

## 2015-12-15 NOTE — Telephone Encounter (Signed)
°*  STAT* If patient is at the pharmacy, call can be transferred to refill team.   1. Which medications need to be refilled? (please list name of each medication and dose if known) Diltiazem 60mg   2. Which pharmacy/location (including street and city if local pharmacy) is medication to be sent to? [facility] on [address]   3. Do they need a 30 day or 90 day supply? [place]

## 2015-12-16 ENCOUNTER — Other Ambulatory Visit: Payer: Self-pay | Admitting: *Deleted

## 2015-12-16 MED ORDER — DILTIAZEM HCL ER 60 MG PO CP12: 60.0000 mg | ORAL_CAPSULE | Freq: Two times a day (BID) | ORAL | Status: DC

## 2015-12-20 ENCOUNTER — Ambulatory Visit (INDEPENDENT_AMBULATORY_CARE_PROVIDER_SITE_OTHER): Payer: Medicare Other | Admitting: Nurse Practitioner

## 2015-12-20 ENCOUNTER — Encounter: Payer: Self-pay | Admitting: Nurse Practitioner

## 2015-12-20 VITALS — BP 140/66 | HR 70 | Ht 62.0 in | Wt 119.8 lb

## 2015-12-20 DIAGNOSIS — I38 Endocarditis, valve unspecified: Secondary | ICD-10-CM | POA: Diagnosis not present

## 2015-12-20 DIAGNOSIS — I10 Essential (primary) hypertension: Secondary | ICD-10-CM

## 2015-12-20 DIAGNOSIS — I2510 Atherosclerotic heart disease of native coronary artery without angina pectoris: Secondary | ICD-10-CM | POA: Insufficient documentation

## 2015-12-20 DIAGNOSIS — I272 Other secondary pulmonary hypertension: Secondary | ICD-10-CM | POA: Diagnosis not present

## 2015-12-20 DIAGNOSIS — I071 Rheumatic tricuspid insufficiency: Secondary | ICD-10-CM

## 2015-12-20 DIAGNOSIS — I340 Nonrheumatic mitral (valve) insufficiency: Secondary | ICD-10-CM

## 2015-12-20 NOTE — Patient Instructions (Signed)
Your physician wants you to follow-up in: 6 months with Dr. Kelly.  You will receive a reminder letter in the mail two months in advance. If you don't receive a letter, please call our office to schedule the follow-up appointment.  

## 2015-12-20 NOTE — Progress Notes (Signed)
Office Visit    Patient Name: [name] Date of Encounter: 12/20/2015  Primary Care Provider:  Horatio Pel, MD Primary Cardiologist:  Corky Downs, MD   Chief Complaint    82 year old female with a history of valvular heart disease, hypertension, and paroxysmal atrial fibrillation, who presents for routine follow-up.  Past Medical History    Past Medical History  Diagnosis Date  . Essential hypertension   . Hyperlipidemia   . GERD (gastroesophageal reflux disease)   . Anxiety   . Mitral prolapse   . Degenerative arthritis   . Neuropathy ([place])   . Peripheral neuropathy (HCC)     Small fiber   . Diverticulosis   . Mild renal insufficiency   . History of nuclear stress test 09/19/2007    normal pattern of perfusion; post-stress EF 86%; EKG negative for ischemia; low risk scan  . Carotid arterial disease ([place]) 07/30/2012    carotid doppler; normal study  . Foot fracture, left   . Memory disorder 03/05/2014  . Chronic anticoagulation, with coumadin, with PAF and CHADS2Vasc2 score of 4 09/16/2014  . CAD (coronary artery disease)     a. 08/2014 NSTEMI/Cath: mild Ca2+ or RCA ostium, otw nl cors. CO 3.3 L/min (thermo), 3.7 L/min ([place]).  . Anemia, iron deficiency 09/16/2014  . Abnormality of gait 09/07/2015  . Valvular heart disease     a. 04/2015 Echo: EF 65-70%, no rwma, Gr1 DD, mild AS, triv AI, mild MS, mod MR, mod dil LA, mod TR, sev increased PASP.  Marland Kitchen Pulmonary hypertension ([place])   . PAF (paroxysmal atrial fibrillation) (HCC)     a. 08/2014-->Coumadin (CHA2DS2VASc = 4).  . Pre-syncope     a. 04/2015 in setting of bradycardia-->CCB/BB doses adjusted.  . Left carotid bruit     a. 07/2015 Carotid U/S: 1-39% bilat ICA stenosis.   Past Surgical History  Procedure Laterality Date  . Abdominal hysterectomy    . Replacement total knee Right   . Tear duct probing with strabismus repair      Tear duct repair surgery  . Gallbladder resection    . Tonsillectomy    .  Appendectomy    . Resuspension procedure    .  bilateral bunionectomies    . Foot fracture surgery Left   . Dilation and curettage of uterus    . Umbilical hernia repair    . Left heart catheterization with coronary angiogram N/A 09/14/2014    Procedure: LEFT HEART CATHETERIZATION WITH CORONARY ANGIOGRAM;  Surgeon: Troy Sine, MD;  Location: [facility] CATH LAB;  Service: Cardiovascular;  Laterality: N/A;    Allergies  Allergies  Allergen Reactions  . Avelox [Moxifloxacin Hcl In Nacl] Shortness Of Breath and Swelling  . Aricept [Donepezil Hcl] Diarrhea  . Codeine Swelling       . Garlic Other (See Comments)    Gi problems  . Latex Hives, Itching, Rash and Other (See Comments)    Watery blisters   . Onion Other (See Comments)    Gi problems  . Sulfa Drugs Cross Reactors Swelling  . Polysporin [Bacitracin-Polymyxin B] Other (See Comments)    History of Present Illness    82 year old female with the above complex past medical history including mild aortic stenosis, moderate mitral regurgitation, moderate tricuspid regurgitation, pulmonary hypertension, essential hypertension, non-STEMI in January 2016 with nonobstructive CAD and catheterization, and paroxysmal atrial fibrillation on chronic Coumadin. She was last seen in clinic in December 2016 following hospitalization in September 2016 during  which time she experienced presyncope in the setting of bradycardia requiring adjustments to her AV nodal blocking agents. At her last visit in December, she is doing well and blood pressure was stable. She was seen in the emergency department on April 2 secondary to a cough in the setting of asthma. She has been using inhalers when necessary and since then has done well. She remains active, still driving and walking at least 45 or more minutes per day just in getting back and forth to her cafeteria and mailbox. She has not been having any chest pain or dyspnea and further denies palpitations, PND,  orthopnea, dizziness, syncope, edema, or early satiety. She is in very good spirits and says she feels blessed to feel as well as she does.   Home Medications    Prior to Admission medications   Medication Sig Start Date End Date Taking? Authorizing Provider  atorvastatin (LIPITOR) 20 MG tablet Take 1 tablet by mouth at bedtime. 11/30/15  Yes Historical Provider, MD  benzonatate (TESSALON) 100 MG capsule Take 1 capsule (100 mg total) by mouth every 8 (eight) hours. 11/20/15  Yes Larene Pickett, PA-C  carvedilol (COREG) 3.125 MG tablet Take one-half tablet by mouth twice daily 08/18/15  Yes Troy Sine, MD  cholecalciferol (VITAMIN D) 1000 UNITS tablet Take 1,000 Units by mouth daily.     Yes Historical Provider, MD  colestipol (COLESTID) 1 G tablet Take 1 g by mouth 3 (three) times daily. For diarrhea and does not take at the same time as coumadin   Yes Historical Provider, MD  cycloSPORINE (RESTASIS) 0.05 % ophthalmic emulsion Place 1 drop into both eyes daily.     Yes Historical Provider, MD  diltiazem (CARDIZEM SR) 60 MG 12 hr capsule Take 1 capsule (60 mg total) by mouth every 12 (twelve) hours. 12/16/15  Yes Troy Sine, MD  fenofibrate micronized (LOFIBRA) 134 MG capsule Take 1 capsule (134 mg total) by mouth daily. 05/20/15  Yes Troy Sine, MD  Flaxseed, Linseed, (FLAXSEED OIL) 1000 MG CAPS Take 1,000-2,000 mg by mouth 2 (two) times daily. 2 @ lunch and 1 @ dinner   Yes Historical Provider, MD  gabapentin (NEURONTIN) 300 MG capsule TAKE 1 CAPSULE THREE TIMES DAILY AND 3 CAPSULES AT BEDTIME 11/03/15  Yes Carmen Dohmeier, MD  hydrALAZINE (APRESOLINE) 25 MG tablet Take 1 tablet (25 mg total) by mouth 3 (three) times daily. NEED OV. 12/02/15  Yes Troy Sine, MD  hydrochlorothiazide (HYDRODIURIL) 25 MG tablet Take 25 mg by mouth daily.  10/15/15  Yes Historical Provider, MD  ipratropium (ATROVENT) 0.06 % nasal spray Place 1 spray into both nostrils 2 (two) times daily with a meal.  11/17/15   Yes Historical Provider, MD  Lactobacillus (ACIDOPHILUS) 100 MG CAPS Take 100 mg by mouth daily.    Yes Historical Provider, MD  loperamide (IMODIUM) 2 MG capsule Take 1 capsule (2 mg total) by mouth 2 (two) times daily as needed for diarrhea or loose stools. 05/12/15  Yes Domenic Polite, MD  Multiple Vitamin (MULTIVITAMIN) tablet Take 1 tablet by mouth daily.     Yes Historical Provider, MD  Multiple Vitamins-Minerals (VISION FORMULA/LUTEIN) TABS Take 1 tablet by mouth daily.   Yes Historical Provider, MD  pantoprazole (PROTONIX) 40 MG tablet Take 40 mg by mouth daily.  11/15/15  Yes Historical Provider, MD  pyridOXINE (VITAMIN B-6) 100 MG tablet Take 100 mg by mouth daily.   Yes Historical Provider, MD  simethicone [facility])  125 MG chewable tablet Chew 125 mg by mouth as needed for flatulence.   Yes Historical Provider, MD  warfarin (COUMADIN) 2 MG tablet TAKE 1 AND 1/2 TO 2 TABLETS BY MOUTH DAILY AS DIRECTED BY COUMADIN CLINIC Patient taking differently: TAKE 3 MG ON SUNDAYS AND THURSDAYS, THEN 4 MG ON ALL OTHER DAYS OF THE WEEK 09/05/15  Yes Troy Sine, MD    Review of Systems    Patient has been doing well without chest pain, palpitations, dyspnea, PND, orthopnea, dizziness, syncope, edema, or early satiety.  All other systems reviewed and are otherwise negative except as noted above.  Physical Exam    VS:  BP 140/66 mmHg  Pulse 70  Ht 5\' 2"  (1.575 m)  Wt 119 lb 12.8 oz (54.341 kg)  BMI 21.91 kg/m2 , BMI Body mass index is 21.91 kg/(m^2). GEN: Well nourished, well developed, in no acute distress. HEENT: normal. Neck: Supple, no JVD, or masses.  Soft left carotid bruit. Cardiac: RRR, no rubs, or gallops. 3/6 systolic ejection murmur at the left upper sternal border and heard throughout. No clubbing, cyanosis, edema.  Radials/DP/PT 2+ and equal bilaterally.  Respiratory:  Respirations regular and unlabored, clear to auscultation bilaterally. GI: Soft, nontender, nondistended, BS + x  4. MS: no deformity or atrophy. Skin: warm and dry, no rash. Neuro:  Strength and sensation are intact. Psych: Normal affect.  Accessory Clinical Findings    ECG - She had an ECG done in the ER on April 2 showing sinus rhythm, 78, no acute ST or T changes.  Assessment & Plan    1.  Valvular heart disease (mild aortic stenosis, moderate mitral and tricuspid regurgitation): Most recent echo in September 2016 showed normal LV function. She has not been having any chest pain, presyncope, or dyspnea. Continue conservative medical therapy.  2. Paroxysmal atrial fibrillation: She denies any recent palpitations. She remains on low-dose beta blocker and diltiazem therapy. Heart rate is stable today. She is followed closely in Coumadin clinic with a CHA2DS2VASc 4.  3. Essential hypertension: Blood pressure is stable. She says it typically runs in the 120s to 130s. At 140 today, I do not think that she requires any titration of her medications. She remains on beta blocker, calcium channel blocker, hydralazine, and HCTZ.  4. Pulmonary hypertension: Severely elevated pulmonary pressures on echo in September 2016. She has not been having any significant dyspnea. No evidence of right heart failure on exam. Continue current regimen.  5. Disposition: Patient continues to do well and will follow-up with Dr. Claiborne Billings in approximately 6 months or sooner if necessary.   Murray Hodgkins, NP 12/20/2015, 11:21 AM

## 2015-12-21 ENCOUNTER — Telehealth: Payer: Self-pay | Admitting: Cardiovascular Disease

## 2015-12-21 NOTE — Telephone Encounter (Signed)
Left message with Helene Kelp, Nurse at [facility] to see if we are able to have INR drawn earlier in the week since patient will be out of town.

## 2015-12-21 NOTE — Telephone Encounter (Signed)
New message      Pt is due to have another PT/INR on 12-29-15.  She will be out of town.  Calling to see what should she do dosage wise.

## 2015-12-21 NOTE — Telephone Encounter (Signed)
Spoke with [name] at [facility] and she will have patient get INR on Monday May 8th before the pt leaves town.

## 2015-12-26 ENCOUNTER — Ambulatory Visit (INDEPENDENT_AMBULATORY_CARE_PROVIDER_SITE_OTHER): Payer: Medicare Other | Admitting: Pharmacist Clinician (PhC)/ Clinical Pharmacy Specialist

## 2015-12-26 DIAGNOSIS — Z7901 Long term (current) use of anticoagulants: Secondary | ICD-10-CM | POA: Diagnosis not present

## 2015-12-26 DIAGNOSIS — I480 Paroxysmal atrial fibrillation: Secondary | ICD-10-CM

## 2015-12-26 LAB — POCT INR: INR: 1.96

## 2016-01-05 ENCOUNTER — Ambulatory Visit (INDEPENDENT_AMBULATORY_CARE_PROVIDER_SITE_OTHER): Payer: Medicare Other | Admitting: Pharmacist Clinician (PhC)/ Clinical Pharmacy Specialist

## 2016-01-05 DIAGNOSIS — I480 Paroxysmal atrial fibrillation: Secondary | ICD-10-CM | POA: Diagnosis not present

## 2016-01-05 DIAGNOSIS — Z7901 Long term (current) use of anticoagulants: Secondary | ICD-10-CM

## 2016-01-05 LAB — PROTIME-INR: INR: 2.2 — AB (ref 0.9–1.1)

## 2016-01-25 DIAGNOSIS — M25511 Pain in right shoulder: Secondary | ICD-10-CM | POA: Diagnosis not present

## 2016-01-25 DIAGNOSIS — R490 Dysphonia: Secondary | ICD-10-CM | POA: Diagnosis not present

## 2016-01-25 DIAGNOSIS — M19011 Primary osteoarthritis, right shoulder: Secondary | ICD-10-CM | POA: Diagnosis not present

## 2016-01-25 DIAGNOSIS — J4530 Mild persistent asthma, uncomplicated: Secondary | ICD-10-CM | POA: Diagnosis not present

## 2016-01-26 ENCOUNTER — Other Ambulatory Visit: Payer: Self-pay | Admitting: Cardiovascular Disease

## 2016-01-26 DIAGNOSIS — Z7901 Long term (current) use of anticoagulants: Secondary | ICD-10-CM | POA: Diagnosis not present

## 2016-01-26 DIAGNOSIS — I480 Paroxysmal atrial fibrillation: Secondary | ICD-10-CM | POA: Diagnosis not present

## 2016-01-26 LAB — POCT INR: INR: 2.85

## 2016-01-26 NOTE — Telephone Encounter (Signed)
Rx(s) sent to pharmacy electronically.  

## 2016-01-27 ENCOUNTER — Ambulatory Visit (INDEPENDENT_AMBULATORY_CARE_PROVIDER_SITE_OTHER): Payer: Medicare Other | Admitting: Pharmacist

## 2016-01-27 ENCOUNTER — Ambulatory Visit (INDEPENDENT_AMBULATORY_CARE_PROVIDER_SITE_OTHER): Payer: Medicare Other | Admitting: Sports Medicine

## 2016-01-27 DIAGNOSIS — I480 Paroxysmal atrial fibrillation: Secondary | ICD-10-CM

## 2016-01-27 DIAGNOSIS — M25511 Pain in right shoulder: Secondary | ICD-10-CM

## 2016-01-27 DIAGNOSIS — Z7901 Long term (current) use of anticoagulants: Secondary | ICD-10-CM

## 2016-01-27 MED ORDER — METHYLPREDNISOLONE ACETATE 40 MG/ML IJ SUSP
40.0000 mg | Freq: Once | INTRAMUSCULAR | Status: AC
  Administered 2016-01-27: 40 mg via INTRA_ARTICULAR

## 2016-01-27 NOTE — Progress Notes (Signed)
   Subjective:    Patient ID: [name], female    DOB: Apr 28, 1922, 82 y.o.   MRN: [id number]  HPI chief complaint: Right shoulder pain  Very pleasant 82 year old ambidextrous female comes in today complaining of several weeks of right shoulder pain. She denies any trauma. She describes a gradual onset of pain that is diffuse throughout the shoulder. It is worse with activity such as reaching away from her body or reaching overhead. She does not have any numbness or tingling. No significant nighttime pain. She has not had any treatment to date. Recent x-rays of her right shoulder were done by her primary care physician and they show minimal degenerative changes and nothing acute.  Past medical history is reviewed per the notes from Dr. Pennie Banter office Medications are also reviewed.  Allergies reviewed    Review of Systems    as above Objective:   Physical Exam  Well-developed, well-nourished. No acute distress.  Right shoulder: Patient has full active and passive range of motion but does have a positive painful arc. Pain with rotator cuff stressing. Good strength. No gross deformity. Neurovascularly intact distally.  X-rays are as above      Assessment & Plan:   Right shoulder pain likely secondary to rotator cuff tendinopathy  X-rays do not show much in the way of arthritis. I recommended a subacromial cortisone injection today. Patient agrees. She tolerated this without difficulty. I think she is okay to continue with activity but I do want her to limit repetitive overhead motion with her exercise classes. She will follow-up with me in 3 weeks for reevaluation.  Consent obtained and verified. Time-out conducted. Noted no overlying erythema, induration, or other signs of local infection. Skin prepped in a sterile fashion. Topical analgesic spray: Ethyl chloride. Joint: right shoulder (subacromial) Needle: 25g 1.5 inch Completed without difficulty. Meds: 3cc 1%  xylocaine, 1cc (40mg ) depomedrol  Advised to call if fevers/chills, erythema, induration, drainage, or persistent bleeding.

## 2016-01-31 DIAGNOSIS — K219 Gastro-esophageal reflux disease without esophagitis: Secondary | ICD-10-CM | POA: Insufficient documentation

## 2016-01-31 DIAGNOSIS — R490 Dysphonia: Secondary | ICD-10-CM | POA: Diagnosis not present

## 2016-02-09 ENCOUNTER — Ambulatory Visit: Payer: Medicare Other | Admitting: Podiatry

## 2016-02-13 ENCOUNTER — Telehealth: Payer: Self-pay | Admitting: *Deleted

## 2016-02-13 MED ORDER — DILTIAZEM HCL 30 MG PO TABS: 30.0000 mg | ORAL_TABLET | Freq: Four times a day (QID) | ORAL | Status: DC

## 2016-02-13 NOTE — Telephone Encounter (Signed)
Spoke to patient Patient unable to get 90 day supply of diltiazem ER 60 MG EVERY 12 HOURS. Patient had been taking medication incorrectly . She has been taking medication 4 times a day.  RN contacted pharmacy for patient to purchase medication out of pocket it will be over $500 , try [facility]-- IT WILL COST $192 FOR 30 DAY SUPPLY. SPOKE TO KRISTIN,PHARMACIST WHAT OTHER OPTIONS.  PER kristin, call prescription for regular diltiazem 30 mg for 4 times a day for 1 month ,then return to 60 mg ER  RN phone in Rx, Letter given to patient- with directions Pt verbalized understanding

## 2016-02-15 ENCOUNTER — Ambulatory Visit (INDEPENDENT_AMBULATORY_CARE_PROVIDER_SITE_OTHER): Payer: Medicare Other | Admitting: Podiatry

## 2016-02-15 ENCOUNTER — Encounter: Payer: Self-pay | Admitting: Podiatry

## 2016-02-15 DIAGNOSIS — B351 Tinea unguium: Secondary | ICD-10-CM

## 2016-02-15 DIAGNOSIS — M79675 Pain in left toe(s): Secondary | ICD-10-CM

## 2016-02-15 DIAGNOSIS — M79676 Pain in unspecified toe(s): Secondary | ICD-10-CM

## 2016-02-15 NOTE — Telephone Encounter (Signed)
agree

## 2016-02-15 NOTE — Progress Notes (Signed)
Patient ID: CHLORIS ZUKAUSKAS, female   DOB: [birth year], 82 y.o.   MRN: [id number] Complaint:  Visit Type: Patient returns to my office for continued preventative foot care services. Complaint: Patient states" my nails have grown long and thick and become painful to walk and wear shoes" Patient has been diagnosed with neuropathy.  . She presents to the office for preventive foot care services and says her ulcer hal healed with no pain or drainage.  .   Podiatric Exam: Vascular: dorsalis pedis and posterior tibial pulses are palpable bilateral. Capillary return is immediate. Temperature gradient is WNL. Skin turgor WNL  Sensorium: Normal Semmes Weinstein monofilament test. Normal tactile sensation bilaterally. Nail Exam: Pt has thick disfigured discolored nails with subungual debris noted bilateral entire nail hallux through fifth toes both feet. Ulcer Exam: There is no evidence of ulcer or pre-ulcerative changes or infection. Orthopedic Exam: Muscle tone and strength are WNL. No limitations in general ROM. No crepitus or effusions noted. Foot type and digits show no abnormalities. Bony prominences are unremarkable.Severe dorsiflexed second toe right foot. Skin: No Porokeratosis. No infection or ulcers  Diagnosis:  Tinea unguium, Pain in right toe, pain in left toes  Treatment & Plan Procedures and Treatment: Consent by patient was obtained for treatment procedures. The patient understood the discussion of treatment and procedures well. All questions were answered thoroughly reviewed. Debridement of mycotic and hypertrophic toenails, 1 through 5 bilateral and clearing of subungual debris. No ulceration, no infection noted. Told her to cut hole in shoe right to relieve pressure on toe. Return Visit-Office Procedure: Patient instructed to return to the office for a follow up visit 10 weeks for continued evaluation and treatment.   Gardiner Barefoot DPM

## 2016-02-20 DIAGNOSIS — K921 Melena: Secondary | ICD-10-CM | POA: Diagnosis not present

## 2016-02-20 DIAGNOSIS — R1011 Right upper quadrant pain: Secondary | ICD-10-CM | POA: Diagnosis not present

## 2016-02-20 LAB — PROTIME-INR: INR: 4.7 — AB (ref <=1.1)

## 2016-02-22 ENCOUNTER — Ambulatory Visit: Payer: Medicare Other | Admitting: Sports Medicine

## 2016-02-22 ENCOUNTER — Ambulatory Visit (INDEPENDENT_AMBULATORY_CARE_PROVIDER_SITE_OTHER): Payer: Medicare Other | Admitting: Pharmacist Clinician (PhC)/ Clinical Pharmacy Specialist

## 2016-02-22 DIAGNOSIS — Z7901 Long term (current) use of anticoagulants: Secondary | ICD-10-CM

## 2016-02-22 DIAGNOSIS — I480 Paroxysmal atrial fibrillation: Secondary | ICD-10-CM

## 2016-02-23 ENCOUNTER — Ambulatory Visit (INDEPENDENT_AMBULATORY_CARE_PROVIDER_SITE_OTHER): Payer: Medicare Other | Admitting: Pharmacist

## 2016-02-23 DIAGNOSIS — Z7901 Long term (current) use of anticoagulants: Secondary | ICD-10-CM

## 2016-02-23 DIAGNOSIS — I480 Paroxysmal atrial fibrillation: Secondary | ICD-10-CM | POA: Diagnosis not present

## 2016-02-23 LAB — POCT INR: INR: 1.3

## 2016-02-23 LAB — PROTIME-INR

## 2016-02-24 ENCOUNTER — Other Ambulatory Visit: Payer: Self-pay | Admitting: Gastroenterology

## 2016-02-24 ENCOUNTER — Telehealth: Payer: Self-pay | Admitting: Cardiovascular Disease

## 2016-02-24 ENCOUNTER — Other Ambulatory Visit: Payer: Self-pay | Admitting: Neurology

## 2016-02-24 DIAGNOSIS — K921 Melena: Secondary | ICD-10-CM

## 2016-02-24 DIAGNOSIS — R1011 Right upper quadrant pain: Secondary | ICD-10-CM

## 2016-02-24 NOTE — Telephone Encounter (Signed)
See anticoag note

## 2016-02-24 NOTE — Telephone Encounter (Signed)
New Message  Pt c/o medication issue:  1. Name of Medication: Warfarin   2. How are you currently taking this medication (dosage and times per day)? 2mg  1 1/2 tablets  3. Are you having a reaction (difficulty breathing--STAT)? INR drop to 1.3   4. What is your medication issue? Pt wants further instruction if she needs to start taking the med again. Please call back to discuss

## 2016-02-27 ENCOUNTER — Telehealth: Payer: Self-pay | Admitting: Pharmacist Clinician (PhC)/ Clinical Pharmacy Specialist

## 2016-02-27 NOTE — Telephone Encounter (Signed)
Pt reports today that stool is back to brown, and she is feeling much better.  Will have her take warfarin 6 mg today then resume her normal dose of 4 mg daily except 3 mg each Sunday.  Repeat INR in 10 days.  Patient voiced understanding.

## 2016-02-29 ENCOUNTER — Ambulatory Visit
Admission: RE | Admit: 2016-02-29 | Discharge: 2016-02-29 | Disposition: A | Discharge status: Home / Self Care | Payer: Medicare Other | Source: Ambulatory Visit | Attending: Gastroenterology | Admitting: Gastroenterology

## 2016-02-29 DIAGNOSIS — K5730 Diverticulosis of large intestine without perforation or abscess without bleeding: Secondary | ICD-10-CM | POA: Diagnosis not present

## 2016-02-29 DIAGNOSIS — K921 Melena: Secondary | ICD-10-CM

## 2016-02-29 DIAGNOSIS — R1011 Right upper quadrant pain: Secondary | ICD-10-CM

## 2016-02-29 MED ORDER — IOPAMIDOL (ISOVUE-300) INJECTION 61%
100.0000 mL | Freq: Once | INTRAVENOUS | Status: AC | PRN
  Administered 2016-02-29: 100 mL via INTRAVENOUS

## 2016-03-01 ENCOUNTER — Telehealth: Payer: Self-pay | Admitting: Neurology

## 2016-03-01 NOTE — Telephone Encounter (Signed)
Pt called in about request for refill on gabapentin (NEURONTIN) 300 MG capsule. She wants to know why it was refused. Please call

## 2016-03-01 NOTE — Telephone Encounter (Signed)
Pt called back said she found 2 bottles in her medicine cabinet and does not need a refill. She apologized for any inconvenience this has caused.

## 2016-03-08 DIAGNOSIS — N858 Other specified noninflammatory disorders of uterus: Secondary | ICD-10-CM | POA: Diagnosis not present

## 2016-03-09 ENCOUNTER — Other Ambulatory Visit: Payer: Self-pay | Admitting: Cardiovascular Disease

## 2016-03-10 ENCOUNTER — Other Ambulatory Visit: Payer: Self-pay | Admitting: Cardiovascular Disease

## 2016-03-12 ENCOUNTER — Other Ambulatory Visit: Payer: Self-pay | Admitting: *Deleted

## 2016-03-12 ENCOUNTER — Telehealth: Payer: Self-pay | Admitting: Cardiovascular Disease

## 2016-03-12 ENCOUNTER — Ambulatory Visit (INDEPENDENT_AMBULATORY_CARE_PROVIDER_SITE_OTHER): Payer: Medicare Other | Admitting: Pharmacist

## 2016-03-12 DIAGNOSIS — I480 Paroxysmal atrial fibrillation: Secondary | ICD-10-CM | POA: Diagnosis not present

## 2016-03-12 DIAGNOSIS — Z7901 Long term (current) use of anticoagulants: Secondary | ICD-10-CM

## 2016-03-12 LAB — POCT INR: INR: 3.6

## 2016-03-12 MED ORDER — HYDROCHLOROTHIAZIDE 25 MG PO TABS: ORAL_TABLET | ORAL | 0 refills | Status: DC

## 2016-03-13 ENCOUNTER — Telehealth: Payer: Self-pay | Admitting: Cardiovascular Disease

## 2016-03-13 NOTE — Telephone Encounter (Signed)
Called patient to verify meds, received med refill request from [facility]. Need to verify what patient is taking before responding to request. [facility]. awating call from patient.

## 2016-03-14 ENCOUNTER — Ambulatory Visit (INDEPENDENT_AMBULATORY_CARE_PROVIDER_SITE_OTHER): Payer: Medicare Other | Admitting: Neurology

## 2016-03-14 ENCOUNTER — Encounter: Payer: Self-pay | Admitting: Neurology

## 2016-03-14 ENCOUNTER — Encounter: Payer: Self-pay | Admitting: Cardiovascular Disease

## 2016-03-14 VITALS — BP 127/68 | HR 67 | Ht 62.5 in | Wt 116.0 lb

## 2016-03-14 DIAGNOSIS — G63 Polyneuropathy in diseases classified elsewhere: Secondary | ICD-10-CM | POA: Diagnosis not present

## 2016-03-14 DIAGNOSIS — R413 Other amnesia: Secondary | ICD-10-CM

## 2016-03-14 DIAGNOSIS — R269 Unspecified abnormalities of gait and mobility: Secondary | ICD-10-CM

## 2016-03-14 NOTE — Progress Notes (Signed)
Reason for visit: Peripheral neuropathy, memory disorder  [name] is an 82 y.o. female  History of present illness:  [name] is a 82 year old right-handed white female with a history of a peripheral neuropathy. The patient takes gabapentin for this with some benefit, this does not completely control the pain, but she is able to feel better with it. She has some mild gait instability, she reports no falls, she does not use a cane for ambulation. The patient is trying to remain active, she operates a motor vehicle without difficulty. She has had a recent episode of irritable bowel syndrome, but she is better at this point. The patient had been having some problems with coughing on Namenda, but she has continued to have issues coughing off the medication, and she was seen and evaluated by ear nose and throat. The patient wishes to retry the Namenda at this time. The patient otherwise denies any other significant issues that have come up since last seen. She believes that the memory issues have been stable over time.  Past Medical History:  Diagnosis Date  . Abnormality of gait 09/07/2015  . Anemia, iron deficiency 09/16/2014  . Anxiety   . CAD (coronary artery disease)    a. 08/2014 NSTEMI/Cath: mild Ca2+ or RCA ostium, otw nl cors. CO 3.3 L/min (thermo), 3.7 L/min (Fick).  . Carotid arterial disease ([place]) 07/30/2012   carotid doppler; normal study  . Chronic anticoagulation, with coumadin, with PAF and CHADS2Vasc2 score of 4 09/16/2014  . Degenerative arthritis   . Diverticulosis   . Essential hypertension   . Foot fracture, left   . GERD (gastroesophageal reflux disease)   . History of nuclear stress test 09/19/2007   normal pattern of perfusion; post-stress EF 86%; EKG negative for ischemia; low risk scan  . Hyperlipidemia   . Left carotid bruit    a. 07/2015 Carotid U/S: 1-39% bilat ICA stenosis.  . Memory disorder 03/05/2014  . Mild renal insufficiency   . Mitral  prolapse   . Neuropathy ([place])   . PAF (paroxysmal atrial fibrillation) (H[facility])    a. 08/2014-->Coumadin (CHA2DS2VASc = 4).  . Peripheral neuropathy ([facility])    Small fiber   . Pre-syncope    a. 04/2015 in setting of bradycardia-->CCB/BB doses adjusted.  . Pulmonary hypertension ([place])   . Valvular heart disease    a. 04/2015 Echo: EF 65-70%, no rwma, Gr1 DD, mild AS, triv AI, mild MS, mod MR, mod dil LA, mod TR, sev increased PASP.    Past Surgical History:  Procedure Laterality Date  .  Bilateral bunionectomies    . ABDOMINAL HYSTERECTOMY    . APPENDECTOMY    . DILATION AND CURETTAGE OF UTERUS    . FOOT FRACTURE SURGERY Left   . gallbladder resection    . LEFT HEART CATHETERIZATION WITH CORONARY ANGIOGRAM N/A 09/14/2014   Procedure: LEFT HEART CATHETERIZATION WITH CORONARY ANGIOGRAM;  Surgeon: Troy Sine, MD;  Location: [facility] CATH LAB;  Service: Cardiovascular;  Laterality: N/A;  . REPLACEMENT TOTAL KNEE Right   . resuspension procedure    . TEAR DUCT PROBING WITH STRABISMUS REPAIR     Tear duct repair surgery  . TONSILLECTOMY    . UMBILICAL HERNIA REPAIR      Family History  Problem Relation Age of Onset  . Pneumonia Mother   . Coronary artery disease Mother   . Hypertension Mother   . Heart disease Father   . Cancer Father   . Hypertension  Father   . Arthritis Sister     Social history:  reports that she has quit smoking. She has never used smokeless tobacco. She reports that she does not drink alcohol or use drugs.    Allergies  Allergen Reactions  . Avelox [Moxifloxacin Hcl In [place]] Shortness Of Breath and Swelling  . Moxifloxacin Other (See Comments), Shortness Of Breath and Swelling    other  . Aricept [Donepezil Hcl] Diarrhea  . Codeine Swelling       . Donepezil Diarrhea  . Garlic Other (See Comments)    Gi problems  . Latex Hives, Itching, Rash and Other (See Comments)    Watery blisters   . Onion Other (See Comments)    Gi problems  . Sulfa Drugs Cross  Reactors Swelling  . Other Other (See Comments)    Her throat closes up  . Duloxetine Rash  . Polysporin [Bacitracin-Polymyxin B] Other (See Comments)    other    Medications:  Prior to Admission medications   Medication Sig Start Date End Date Taking? Authorizing Provider  atorvastatin (LIPITOR) 20 MG tablet Take 1 tablet by mouth at bedtime. 11/30/15  Yes Historical Provider, MD  benzonatate (TESSALON) 100 MG capsule Take by mouth. 11/20/15  Yes Historical Provider, MD  carvedilol (COREG) 3.125 MG tablet Take one-half tablet by mouth twice daily 08/18/15  Yes Troy Sine, MD  cetirizine (ZYRTEC) 10 MG tablet Take 10 mg by mouth.   Yes Historical Provider, MD  cholecalciferol (VITAMIN D) 1000 UNITS tablet Take 1,000 Units by mouth daily.     Yes Historical Provider, MD  colestipol (COLESTID) 1 G tablet Take 1 g by mouth 3 (three) times daily. For diarrhea and does not take at the same time as coumadin   Yes Historical Provider, MD  Cyanocobalamin (RA VITAMIN B-12 TR) 1000 MCG TBCR Take by mouth.   Yes Historical Provider, MD  cycloSPORINE (RESTASIS) 0.05 % ophthalmic emulsion Place 1 drop into both eyes daily.     Yes Historical Provider, MD  diltiazem (CARDIZEM) 30 MG tablet Take 1 tablet (30 mg total) by mouth 4 (four) times daily. 02/13/16  Yes Troy Sine, MD  fenofibrate micronized (LOFIBRA) 134 MG capsule Take 1 capsule (134 mg total) by mouth daily. 05/20/15  Yes Troy Sine, MD  Flaxseed, Linseed, (FLAXSEED OIL) 1000 MG CAPS Take 1,000-2,000 mg by mouth 2 (two) times daily. 2 @ lunch and 1 @ dinner   Yes Historical Provider, MD  gabapentin (NEURONTIN) 300 MG capsule TAKE 1 CAPSULE THREE TIMES DAILY AND 3 CAPSULES AT BEDTIME 11/03/15  Yes Carmen Dohmeier, MD  hydrALAZINE (APRESOLINE) 25 MG tablet Take 1 tablet (25 mg total) by mouth 3 (three) times daily. 01/26/16  Yes Troy Sine, MD  hydrochlorothiazide (HYDRODIURIL) 25 MG tablet TAKE 1 TABLET (25 MG TOTAL) BY MOUTH DAILY.  03/12/16  Yes Troy Sine, MD  ipratropium (ATROVENT) 0.06 % nasal spray Place 1 spray into both nostrils 2 (two) times daily with a meal.  11/17/15  Yes Historical Provider, MD  Lactobacillus (ACIDOPHILUS) 100 MG CAPS Take 100 mg by mouth daily.    Yes Historical Provider, MD  loperamide (IMODIUM) 2 MG capsule Take 1 capsule (2 mg total) by mouth 2 (two) times daily as needed for diarrhea or loose stools. 05/12/15  Yes Domenic Polite, MD  Multiple Vitamin (MULTIVITAMIN) tablet Take 1 tablet by mouth daily.     Yes Historical Provider, MD  Multiple Vitamins-Minerals (VISION FORMULA/LUTEIN) TABS  Take 1 tablet by mouth daily.   Yes Historical Provider, MD  pantoprazole (PROTONIX) 40 MG tablet Take 40 mg by mouth daily.  11/15/15  Yes Historical Provider, MD  pyridOXINE (VITAMIN B-6) 100 MG tablet Take 100 mg by mouth daily.   Yes Historical Provider, MD  simethicone (MYLICON) 0000000 MG chewable tablet Chew 125 mg by mouth as needed for flatulence.   Yes Historical Provider, MD  warfarin (COUMADIN) 2 MG tablet TAKE 1 AND 1/2 TO 2 TABLETS BY MOUTH DAILY AS DIRECTED BY COUMADIN CLINIC Patient taking differently: TAKE 3 MG ON SUNDAYS AND THURSDAYS, THEN 4 MG ON ALL OTHER DAYS OF THE WEEK 09/05/15  Yes Troy Sine, MD  acetaminophen (TYLENOL) 500 MG tablet Take 500 mg by mouth.    Historical Provider, MD    ROS:  Out of a complete 14 system review of symptoms, the patient complains only of the following symptoms, and all other reviewed systems are negative.  Memory loss, numbness  Blood pressure 127/68, pulse 67, height 5' 2.5" (1.588 m), weight 116 lb (52.6 kg).  Physical Exam  General: The patient is alert and cooperative at the time of the examination.  Skin: No significant peripheral edema is noted.   Neurologic Exam  Mental status: The patient is alert and oriented x 3 at the time of the examination. The patient has apparent normal recent and remote memory, with an apparently normal  attention span and concentration ability. Mini-Mental Status Examination done today shows a total score of 30/30.   Cranial nerves: Facial symmetry is present. Speech is normal, no aphasia or dysarthria is noted. Extraocular movements are full. Visual fields are full.  Motor: The patient has good strength in all 4 extremities.  Sensory examination: Soft touch sensation is symmetric on the face, arms, and legs.  Coordination: The patient has good finger-nose-finger and heel-to-shin bilaterally.  Gait and station: The patient has a slightly wide-based gait. The patient is able to ambulate independently. Tandem gait is unsteady. Romberg is negative. No drift is seen.  Reflexes: Deep tendon reflexes are symmetric, but are depressed.   Assessment/Plan:  1. Peripheral neuropathy  2. Gait disorder  3. Mild memory disorder  The patient overall is doing well. She wishes to retry the Namenda, we will start with a titration regimen using the 5 mg tablets. She will contact me when she gets to 10 mg twice daily, and I will call in a maintenance prescription for her. She will follow-up otherwise in 6 months.  Jill Alexanders MD 03/14/2016 3:02 PM  [facility] [address] [place], [place] [address]  Phone [phone] Fax [phone]

## 2016-03-14 NOTE — Telephone Encounter (Signed)
New Message ° °Pt returning RN call. Please call back to discuss  °

## 2016-03-14 NOTE — Patient Instructions (Addendum)
With the Namenda 5 mg tablet, take one tablet a day for one week, then take one twice a day for one week, then take one in the morning and two in the evening for one week, then take 2 twice a day. Call for a 10 mg prescription once you run out of the 5 mg tablets.   Fall Prevention in the Home  Falls can cause injuries and can affect people from all age groups. There are many simple things that you can do to make your home safe and to help prevent falls. WHAT CAN I DO ON THE OUTSIDE OF MY HOME?  Regularly repair the edges of walkways and driveways and fix any cracks.  Remove high doorway thresholds.  Trim any shrubbery on the main path into your home.  Use bright outdoor lighting.  Clear walkways of debris and clutter, including tools and rocks.  Regularly check that handrails are securely fastened and in good repair. Both sides of any steps should have handrails.  Install guardrails along the edges of any raised decks or porches.  Have leaves, snow, and ice cleared regularly.  Use sand or salt on walkways during winter months.  In the garage, clean up any spills right away, including grease or oil spills. WHAT CAN I DO IN THE BATHROOM?  Use night lights.  Install grab bars by the toilet and in the tub and shower. Do not use towel bars as grab bars.  Use non-skid mats or decals on the floor of the tub or shower.  If you need to sit down while you are in the shower, use a plastic, non-slip stool.Marland Kitchen  Keep the floor dry. Immediately clean up any water that spills on the floor.  Remove soap buildup in the tub or shower on a regular basis.  Attach bath mats securely with double-sided non-slip rug tape.  Remove throw rugs and other tripping hazards from the floor. WHAT CAN I DO IN THE BEDROOM?  Use night lights.  Make sure that a bedside light is easy to reach.  Do not use oversized bedding that drapes onto the floor.  Have a firm chair that has side arms to use for  getting dressed.  Remove throw rugs and other tripping hazards from the floor. WHAT CAN I DO IN THE KITCHEN?   Clean up any spills right away.  Avoid walking on wet floors.  Place frequently used items in easy-to-reach places.  If you need to reach for something above you, use a sturdy step stool that has a grab bar.  Keep electrical cables out of the way.  Do not use floor polish or wax that makes floors slippery. If you have to use wax, make sure that it is non-skid floor wax.  Remove throw rugs and other tripping hazards from the floor. WHAT CAN I DO IN THE STAIRWAYS?  Do not leave any items on the stairs.  Make sure that there are handrails on both sides of the stairs. Fix handrails that are broken or loose. Make sure that handrails are as long as the stairways.  Check any carpeting to make sure that it is firmly attached to the stairs. Fix any carpet that is loose or worn.  Avoid having throw rugs at the top or bottom of stairways, or secure the rugs with carpet tape to prevent them from moving.  Make sure that you have a light switch at the top of the stairs and the bottom of the stairs. If you do  not have them, have them installed. WHAT ARE SOME OTHER FALL PREVENTION TIPS?  Wear closed-toe shoes that fit well and support your feet. Wear shoes that have rubber soles or low heels.  When you use a stepladder, make sure that it is completely opened and that the sides are firmly locked. Have someone hold the ladder while you are using it. Do not climb a closed stepladder.  Add color or contrast paint or tape to grab bars and handrails in your home. Place contrasting color strips on the first and last steps.  Use mobility aids as needed, such as canes, walkers, scooters, and crutches.  Turn on lights if it is dark. Replace any light bulbs that burn out.  Set up furniture so that there are clear paths. Keep the furniture in the same spot.  Fix any uneven floor  surfaces.  Choose a carpet design that does not hide the edge of steps of a stairway.  Be aware of any and all pets.  Review your medicines with your healthcare provider. Some medicines can cause dizziness or changes in blood pressure, which increase your risk of falling. Talk with your health care provider about other ways that you can decrease your risk of falls. This may include working with a physical therapist or trainer to improve your strength, balance, and endurance.   This information is not intended to replace advice given to you by your health care provider. Make sure you discuss any questions you have with your health care provider.   Document Released: 07/27/2002 Document Revised: 12/21/2014 Document Reviewed: 09/10/2014 Elsevier Interactive Patient Education [employer].

## 2016-03-15 NOTE — Telephone Encounter (Signed)
F/u message ° °Pt returning RN call. Please call back to discuss  °

## 2016-03-21 ENCOUNTER — Other Ambulatory Visit: Payer: Self-pay

## 2016-03-21 MED ORDER — ATORVASTATIN CALCIUM 20 MG PO TABS: 20.0000 mg | ORAL_TABLET | Freq: Every day | ORAL | 1 refills | Status: DC

## 2016-03-21 MED ORDER — DILTIAZEM HCL 30 MG PO TABS: 30.0000 mg | ORAL_TABLET | Freq: Four times a day (QID) | ORAL | 1 refills | Status: DC

## 2016-03-21 MED ORDER — WARFARIN SODIUM 2 MG PO TABS: ORAL_TABLET | ORAL | 1 refills | Status: DC

## 2016-03-21 MED ORDER — FENOFIBRATE MICRONIZED 134 MG PO CAPS: 134.0000 mg | ORAL_CAPSULE | Freq: Every day | ORAL | 1 refills | Status: DC

## 2016-03-21 MED ORDER — HYDROCHLOROTHIAZIDE 25 MG PO TABS: ORAL_TABLET | ORAL | 1 refills | Status: DC

## 2016-03-21 NOTE — Telephone Encounter (Signed)
LMTCB. I called patient to verify meds. Sent current meds to [facility]. No further contact or return call is needed.

## 2016-03-21 NOTE — Telephone Encounter (Signed)
[place] to update the meds we fill, discontinue meds (Namenda and Benazepril) not on patients med list and have the other meds (Loperamide and Colestipol) sent to pcp; pharm. Tech voiced understanding and verified pcp.

## 2016-03-22 ENCOUNTER — Telehealth: Payer: Self-pay | Admitting: Cardiovascular Disease

## 2016-03-22 NOTE — Telephone Encounter (Signed)
Follow-up      The pt is returning the nurses call from yesterday

## 2016-03-26 ENCOUNTER — Ambulatory Visit (INDEPENDENT_AMBULATORY_CARE_PROVIDER_SITE_OTHER): Payer: Medicare Other | Admitting: Pharmacist Clinician (PhC)/ Clinical Pharmacy Specialist

## 2016-03-26 DIAGNOSIS — I480 Paroxysmal atrial fibrillation: Secondary | ICD-10-CM

## 2016-03-26 DIAGNOSIS — Z7901 Long term (current) use of anticoagulants: Secondary | ICD-10-CM | POA: Diagnosis not present

## 2016-03-26 LAB — PROTIME-INR: INR: 5.2 — AB (ref 0.9–1.1)

## 2016-03-27 NOTE — Telephone Encounter (Signed)
L/m on vm informing patient that coreg dosage in chart is 3.125mg  and if different to call office; otherwise no call back is needed.

## 2016-03-30 ENCOUNTER — Other Ambulatory Visit: Payer: Self-pay | Admitting: Cardiovascular Disease

## 2016-04-02 ENCOUNTER — Ambulatory Visit (INDEPENDENT_AMBULATORY_CARE_PROVIDER_SITE_OTHER): Payer: Medicare Other | Admitting: Pharmacist

## 2016-04-02 DIAGNOSIS — Z7901 Long term (current) use of anticoagulants: Secondary | ICD-10-CM

## 2016-04-02 DIAGNOSIS — R143 Flatulence: Secondary | ICD-10-CM | POA: Diagnosis not present

## 2016-04-02 DIAGNOSIS — R197 Diarrhea, unspecified: Secondary | ICD-10-CM | POA: Diagnosis not present

## 2016-04-02 DIAGNOSIS — I480 Paroxysmal atrial fibrillation: Secondary | ICD-10-CM | POA: Diagnosis not present

## 2016-04-02 LAB — POCT INR: INR: 1.1

## 2016-04-09 ENCOUNTER — Ambulatory Visit (INDEPENDENT_AMBULATORY_CARE_PROVIDER_SITE_OTHER): Payer: Medicare Other | Admitting: Pharmacist Clinician (PhC)/ Clinical Pharmacy Specialist

## 2016-04-09 DIAGNOSIS — I480 Paroxysmal atrial fibrillation: Secondary | ICD-10-CM

## 2016-04-09 DIAGNOSIS — Z7901 Long term (current) use of anticoagulants: Secondary | ICD-10-CM

## 2016-04-09 LAB — PROTIME-INR: INR: 1.9 — AB (ref 0.9–1.1)

## 2016-04-16 ENCOUNTER — Ambulatory Visit (INDEPENDENT_AMBULATORY_CARE_PROVIDER_SITE_OTHER): Payer: Medicare Other | Admitting: Pharmacist Clinician (PhC)/ Clinical Pharmacy Specialist

## 2016-04-16 DIAGNOSIS — I10 Essential (primary) hypertension: Secondary | ICD-10-CM | POA: Diagnosis not present

## 2016-04-16 DIAGNOSIS — I341 Nonrheumatic mitral (valve) prolapse: Secondary | ICD-10-CM | POA: Diagnosis not present

## 2016-04-16 DIAGNOSIS — Z7901 Long term (current) use of anticoagulants: Secondary | ICD-10-CM

## 2016-04-16 DIAGNOSIS — E2740 Unspecified adrenocortical insufficiency: Secondary | ICD-10-CM | POA: Diagnosis not present

## 2016-04-16 DIAGNOSIS — I480 Paroxysmal atrial fibrillation: Secondary | ICD-10-CM

## 2016-04-16 LAB — PROTIME-INR: INR: 4.2 — AB (ref 0.9–1.1)

## 2016-04-20 ENCOUNTER — Telehealth: Payer: Self-pay | Admitting: Pharmacist

## 2016-04-20 NOTE — Telephone Encounter (Signed)
Pt called stating she will be leaving town immediately following her PT/INR Check next week. She would like [place] to call her at the number below (her sister's home). If they are not at the house she would like [place] to leave a message on that machine for her.   [phone] [place] (pt's sister)

## 2016-04-25 ENCOUNTER — Ambulatory Visit (INDEPENDENT_AMBULATORY_CARE_PROVIDER_SITE_OTHER): Payer: Medicare Other | Admitting: Podiatry

## 2016-04-25 DIAGNOSIS — B351 Tinea unguium: Secondary | ICD-10-CM | POA: Diagnosis not present

## 2016-04-25 DIAGNOSIS — M79676 Pain in unspecified toe(s): Secondary | ICD-10-CM | POA: Diagnosis not present

## 2016-04-25 DIAGNOSIS — L84 Corns and callosities: Secondary | ICD-10-CM | POA: Diagnosis not present

## 2016-04-25 DIAGNOSIS — M79675 Pain in left toe(s): Secondary | ICD-10-CM | POA: Diagnosis not present

## 2016-04-25 NOTE — Progress Notes (Signed)
Patient ID: [name], female   DOB: Jan 15, 1922, 82 y.o.   MRN: [id number] Complaint:  Visit Type: Patient returns to my office for continued preventative foot care services. Complaint: Patient states" my nails have grown long and thick and become painful to walk and wear shoes" Patient has been diagnosed with neuropathy.  . She presents to the office for preventive foot care services and says her ulcer hal healed with no pain or drainage.  .   Podiatric Exam: Vascular: dorsalis pedis and posterior tibial pulses are palpable bilateral. Capillary return is immediate. Temperature gradient is WNL. Skin turgor WNL  Sensorium: Normal Semmes Weinstein monofilament test. Normal tactile sensation bilaterally. Nail Exam: Pt has thick disfigured discolored nails with subungual debris noted bilateral entire nail hallux through fifth toes both feet. Ulcer Exam: There is no evidence of ulcer or pre-ulcerative changes or infection. Orthopedic Exam: Muscle tone and strength are WNL. No limitations in general ROM. No crepitus or effusions noted. Foot type and digits show no abnormalities. Bony prominences are unremarkable.Severe dorsiflexed second toe right foot. Skin: No Porokeratosis. No infection or ulcers  Diagnosis:  Tinea unguium, Pain in right toe, pain in left toes  Treatment & Plan Procedures and Treatment: Consent by patient was obtained for treatment procedures. The patient understood the discussion of treatment and procedures well. All questions were answered thoroughly reviewed. Debridement of mycotic and hypertrophic toenails, 1 through 5 bilateral and clearing of subungual debris. No ulceration, no infection noted. Told her to cut hole in shoe right to relieve pressure on toe. Return Visit-Office Procedure: Patient instructed to return to the office for a follow up visit 10 weeks for continued evaluation and treatment.   Gardiner Barefoot DPM

## 2016-04-26 ENCOUNTER — Encounter: Payer: Self-pay | Admitting: Cardiovascular Disease

## 2016-04-26 ENCOUNTER — Ambulatory Visit (INDEPENDENT_AMBULATORY_CARE_PROVIDER_SITE_OTHER): Payer: Medicare Other | Admitting: Pharmacist

## 2016-04-26 DIAGNOSIS — I480 Paroxysmal atrial fibrillation: Secondary | ICD-10-CM

## 2016-04-26 DIAGNOSIS — Z7901 Long term (current) use of anticoagulants: Secondary | ICD-10-CM

## 2016-04-26 LAB — POCT INR: INR: 2.2

## 2016-04-26 LAB — PROTIME-INR

## 2016-05-10 ENCOUNTER — Telehealth: Payer: Self-pay | Admitting: Neurology

## 2016-05-10 MED ORDER — MEMANTINE HCL 10 MG PO TABS: 10.0000 mg | ORAL_TABLET | Freq: Two times a day (BID) | ORAL | 3 refills | Status: DC

## 2016-05-10 NOTE — Telephone Encounter (Signed)
Returned call to pt. She reports that she has done well taking Namenda 5 mg, 2 tabs per day but will run out of medication next week. New rx sent in to mail order pharmacy for 10 mg tabs BID per pt request. Verbalized understanding of same dose/new instructions and appreciation for call.

## 2016-05-10 NOTE — Telephone Encounter (Signed)
Patient called, requests call back from nurse regarding questions about [facility]), will be gone from 1:30-3:00pm. Please call [phone] before 1:30pm or between 3:00-5:30pm.

## 2016-05-14 ENCOUNTER — Telehealth: Payer: Self-pay | Admitting: Cardiovascular Disease

## 2016-05-14 ENCOUNTER — Encounter: Payer: Self-pay | Admitting: Cardiovascular Disease

## 2016-05-14 DIAGNOSIS — I480 Paroxysmal atrial fibrillation: Secondary | ICD-10-CM | POA: Diagnosis not present

## 2016-05-14 LAB — POCT INR: INR: 5.9

## 2016-05-14 LAB — PROTIME-INR
INR: 2.5 — AB (ref 0.9–1.1)
INR: 2.5 — AB (ref <=1.1)

## 2016-05-14 NOTE — Telephone Encounter (Signed)
Records received from [facility] for apt on 06/07/16 with Dr Claiborne Billings, Records given to [place] (medical records) CN

## 2016-05-15 ENCOUNTER — Ambulatory Visit (INDEPENDENT_AMBULATORY_CARE_PROVIDER_SITE_OTHER): Payer: Medicare Other | Admitting: Pharmacist

## 2016-05-15 DIAGNOSIS — Z7901 Long term (current) use of anticoagulants: Secondary | ICD-10-CM

## 2016-05-15 DIAGNOSIS — I480 Paroxysmal atrial fibrillation: Secondary | ICD-10-CM

## 2016-05-15 LAB — PROTIME-INR: INR: 2.5 — AB (ref <=1.1)

## 2016-05-18 ENCOUNTER — Encounter: Payer: Self-pay | Admitting: Pharmacist Clinician (PhC)/ Clinical Pharmacy Specialist

## 2016-05-18 ENCOUNTER — Ambulatory Visit (INDEPENDENT_AMBULATORY_CARE_PROVIDER_SITE_OTHER): Payer: Medicare Other | Admitting: Pharmacist Clinician (PhC)/ Clinical Pharmacy Specialist

## 2016-05-18 DIAGNOSIS — Z7901 Long term (current) use of anticoagulants: Secondary | ICD-10-CM

## 2016-05-18 DIAGNOSIS — I480 Paroxysmal atrial fibrillation: Secondary | ICD-10-CM

## 2016-05-18 NOTE — Progress Notes (Signed)
This encounter was created in error - please disregard.

## 2016-05-24 ENCOUNTER — Ambulatory Visit (INDEPENDENT_AMBULATORY_CARE_PROVIDER_SITE_OTHER): Payer: Medicare Other | Admitting: Pharmacist

## 2016-05-24 DIAGNOSIS — Z7901 Long term (current) use of anticoagulants: Secondary | ICD-10-CM | POA: Diagnosis not present

## 2016-05-24 DIAGNOSIS — I480 Paroxysmal atrial fibrillation: Secondary | ICD-10-CM | POA: Diagnosis not present

## 2016-05-24 LAB — POCT INR: INR: 2.8

## 2016-05-30 ENCOUNTER — Ambulatory Visit: Payer: Medicare Other | Admitting: Cardiovascular Disease

## 2016-06-04 ENCOUNTER — Other Ambulatory Visit: Payer: Self-pay | Admitting: Internal Medicine

## 2016-06-04 ENCOUNTER — Ambulatory Visit
Admission: RE | Admit: 2016-06-04 | Discharge: 2016-06-04 | Disposition: A | Discharge status: Home / Self Care | Payer: Medicare Other | Source: Ambulatory Visit | Attending: Internal Medicine | Admitting: Internal Medicine

## 2016-06-04 DIAGNOSIS — S3992XA Unspecified injury of lower back, initial encounter: Secondary | ICD-10-CM | POA: Diagnosis not present

## 2016-06-04 DIAGNOSIS — N390 Urinary tract infection, site not specified: Secondary | ICD-10-CM | POA: Diagnosis not present

## 2016-06-04 DIAGNOSIS — W108XXA Fall (on) (from) other stairs and steps, initial encounter: Secondary | ICD-10-CM

## 2016-06-04 DIAGNOSIS — S0990XA Unspecified injury of head, initial encounter: Secondary | ICD-10-CM | POA: Diagnosis not present

## 2016-06-04 DIAGNOSIS — Z23 Encounter for immunization: Secondary | ICD-10-CM | POA: Diagnosis not present

## 2016-06-04 DIAGNOSIS — Z7901 Long term (current) use of anticoagulants: Secondary | ICD-10-CM | POA: Diagnosis not present

## 2016-06-04 DIAGNOSIS — I480 Paroxysmal atrial fibrillation: Secondary | ICD-10-CM | POA: Diagnosis not present

## 2016-06-04 LAB — PROTIME-INR: INR: 2.2 — AB (ref 0.9–1.1)

## 2016-06-05 ENCOUNTER — Ambulatory Visit (INDEPENDENT_AMBULATORY_CARE_PROVIDER_SITE_OTHER): Payer: Medicare Other | Admitting: Pharmacist

## 2016-06-05 DIAGNOSIS — Z7901 Long term (current) use of anticoagulants: Secondary | ICD-10-CM

## 2016-06-05 DIAGNOSIS — I480 Paroxysmal atrial fibrillation: Secondary | ICD-10-CM

## 2016-06-07 ENCOUNTER — Ambulatory Visit (INDEPENDENT_AMBULATORY_CARE_PROVIDER_SITE_OTHER): Payer: Medicare Other | Admitting: Cardiovascular Disease

## 2016-06-07 ENCOUNTER — Encounter: Payer: Self-pay | Admitting: Cardiovascular Disease

## 2016-06-07 VITALS — BP 124/62 | HR 66 | Ht 62.0 in | Wt 121.4 lb

## 2016-06-07 DIAGNOSIS — I480 Paroxysmal atrial fibrillation: Secondary | ICD-10-CM

## 2016-06-07 DIAGNOSIS — I10 Essential (primary) hypertension: Secondary | ICD-10-CM

## 2016-06-07 DIAGNOSIS — I38 Endocarditis, valve unspecified: Secondary | ICD-10-CM | POA: Diagnosis not present

## 2016-06-07 DIAGNOSIS — I272 Pulmonary hypertension, unspecified: Secondary | ICD-10-CM

## 2016-06-07 DIAGNOSIS — I350 Nonrheumatic aortic (valve) stenosis: Secondary | ICD-10-CM

## 2016-06-07 DIAGNOSIS — I340 Nonrheumatic mitral (valve) insufficiency: Secondary | ICD-10-CM

## 2016-06-07 DIAGNOSIS — E782 Mixed hyperlipidemia: Secondary | ICD-10-CM

## 2016-06-07 NOTE — Patient Instructions (Signed)
Your physician recommends that you schedule a follow-up appointment and echo in 6 months.  If you need a refill on your cardiac medications before your next appointment, please call your pharmacy.

## 2016-06-07 NOTE — Progress Notes (Signed)
Patient ID: [name], female   DOB: 10-Oct-1921, 82 y.o.   MRN: [id number]      HPI: [name] is a 82 y.o. female presents to the office today for a  10 month followup cardiology evaluation and follow-up of her recent hospitalization.  Ms. [name] has a history of hypertension, hyperlipidemia, as well as valvular heart disease. An echo Doppler study in December 2013 showed normal systolic function with an ejection fraction of 55-60%. She had mild stenosis of her aortic valve, moderately severe calcified anulus of the mitral valve with moderate MR, moderately severe LA dilatation,  mild-to-moderate RA dilatation, moderate tricuspid regurgitation and moderately severe pulmonary hypertension with PA pressure estimated at 66 mm. Last year, I started her on amlodipine at 2.5 mg.  She felt that she was breathing better since initiating this therapy and  her blood pressure had markedly improved. A followup echo Doppler study on 11/16/2013 showed normal systolic function with an ejection fraction at 60-65%.  Diastolic parameters were normal.  She had mitral annular calcification with moderate mitral regurgitation.  The left atrium was moderately dilated, the right atrium was moderately dilated, and her tricuspid stenosis was now considered severe.  Pulmonary pressures were slightly reduced from previously, but were still elevated at 61 mm.    She was admitted to the hospital on 09/13/2014 and discharged  3 days later.  On the day of admission she awakened from sleep with midsternal all chest discomfort which he initially felt was indigestion.  Her troponin was mildly elevated at 0.23, which increased to 1.15 and there were new inferior T-wave abnormalities.  She was felt to have suffered a non-ST segment elevation MI.  She also developed paroxysmal atrial fibrillation with RVR, which resulted in similar symptomatology as her admission and was felt most likely that this may have been the cause for  her admission.  I performed cardiac catheterization on 09/14/14  Fluoroscopy revealed severe mitral annular calcification and very mild calcification in the region of the aortic valve.  She had hyperdynamic LV function with an ejection fraction of a proximally 70%.  There was severe mitral annular calcification with 3+ MR.  There was mild calcification in the region of the RCA ostium, but otherwise normal-appearing coronary arteries.  Right heart catheterization was performed and her wedge pressure mean was 10.  LV pressure was 152/11.  Pulmonary artery pressure was 33/12.  Cardiac output was 3.3 L/m by the thermodilution method and 3.7 L/m by the Fick method.  When I saw her in April 2016 she was doing well and maintaining sinus rhythm with ventricular rate at 58 bpm. She was recently hospitalized on 05/08/2015 through 05/12/2015 with this and presyncope.  She was found to be bradycardic and had a junctional rhythm.  Her Cardizem, beta blocker therapy was discontinued.  She  Was dehydrated andalso had mild elevation of potassium with renal insufficiency and ACE inhibition was discontinued. She also had a urinary tract infection for which she was treated with Keflex. She was started back on iron therapy for iron deficiency anemia. At discharge, Cardizem low dose was restarted.  After going home from the hospital, on 05/17/2015 her heart rate increased to greater than 100 bpm. Her blood pressure now has been increased on her, reduced medications.  She is on Coumadin therapy  With a chads2vasc score of 4.  When I saw her, her blood pressure was elevated and with her episodes of increased heart rate.  I started her on  carvedilol one half of a 3.125 mg twice a day.  She has tolerated this well and denies any recurrent episodes of fast heartbeat or any episodes of dizziness.  She admits to trace edema above her pressure socks.  An echo Doppler study on 05/09/2015 showed an ejection fraction of 65-70%.  There was grade  1 diastolic dysfunction.  There was mild aortic stenosis with trivial AR, severe mitral annular calcification with moderate MR, moderate left atrial dilatation, moderate TR and increased pulmonary pressures at 74 mm Hg.   Since I last saw her, she has remained fairly stable.  She denies any chest pain or palpitations.  Unfortunately, she had a fall and she tripped over a step last week.  She did not go to the emergency room, but saw her primary physician and she'll totally underwent a CT scan of her head.  There was mild injury to her right occipital scalp but no underlying skull fracture or intracranial hemorrhage.  There was an air-fluid level of the maxillary sinus.  She denies any presyncope or dizziness or visual changes.  She presents for evaluation.   Past Medical History:  Diagnosis Date  . Abnormality of gait 09/07/2015  . Anemia, iron deficiency 09/16/2014  . Anxiety   . CAD (coronary artery disease)    a. 08/2014 NSTEMI/Cath: mild Ca2+ or RCA ostium, otw nl cors. CO 3.3 L/min (thermo), 3.7 L/min (Fick).  . Carotid arterial disease ([place]) 07/30/2012   carotid doppler; normal study  . Chronic anticoagulation, with coumadin, with PAF and CHADS2Vasc2 score of 4 09/16/2014  . Degenerative arthritis   . Diverticulosis   . Essential hypertension   . Foot fracture, left   . GERD (gastroesophageal reflux disease)   . History of nuclear stress test 09/19/2007   normal pattern of perfusion; post-stress EF 86%; EKG negative for ischemia; low risk scan  . Hyperlipidemia   . Left carotid bruit    a. 07/2015 Carotid U/S: 1-39% bilat ICA stenosis.  . Memory disorder 03/05/2014  . Mild renal insufficiency   . Mitral prolapse   . Neuropathy ([place])   . PAF (paroxysmal atrial fibrillation) ([facility])    a. 08/2014-->Coumadin (CHA2DS2VASc = 4).  . Peripheral neuropathy ([facility])    Small fiber   . Pre-syncope    a. 04/2015 in setting of bradycardia-->CCB/BB doses adjusted.  . Pulmonary hypertension   .  Valvular heart disease    a. 04/2015 Echo: EF 65-70%, no rwma, Gr1 DD, mild AS, triv AI, mild MS, mod MR, mod dil LA, mod TR, sev increased PASP.    Past Surgical History:  Procedure Laterality Date  .  Bilateral bunionectomies    . ABDOMINAL HYSTERECTOMY    . APPENDECTOMY    . DILATION AND CURETTAGE OF UTERUS    . FOOT FRACTURE SURGERY Left   . gallbladder resection    . LEFT HEART CATHETERIZATION WITH CORONARY ANGIOGRAM N/A 09/14/2014   Procedure: LEFT HEART CATHETERIZATION WITH CORONARY ANGIOGRAM;  Surgeon: Troy Sine, MD;  Location: [facility] CATH LAB;  Service: Cardiovascular;  Laterality: N/A;  . REPLACEMENT TOTAL KNEE Right   . resuspension procedure    . TEAR DUCT PROBING WITH STRABISMUS REPAIR     Tear duct repair surgery  . TONSILLECTOMY    . UMBILICAL HERNIA REPAIR      Allergies  Allergen Reactions  . Avelox [Moxifloxacin Hcl In [place]] Shortness Of Breath and Swelling  . Moxifloxacin Other (See Comments), Shortness Of Breath and Swelling  other  . Aricept [Donepezil Hcl] Diarrhea  . Codeine Swelling       . Donepezil Diarrhea  . Garlic Other (See Comments)    Gi problems  . Latex Hives, Itching, Rash and Other (See Comments)    Watery blisters   . Onion Other (See Comments)    Gi problems  . Sulfa Drugs Cross Reactors Swelling  . Other Other (See Comments)    Her throat closes up  . Duloxetine Rash  . Polysporin [Bacitracin-Polymyxin B] Other (See Comments)    other    Current Outpatient Prescriptions  Medication Sig Dispense Refill  . acetaminophen (TYLENOL) 500 MG tablet Take 500 mg by mouth.    Marland Kitchen atorvastatin (LIPITOR) 20 MG tablet Take 1 tablet (20 mg total) by mouth at bedtime. 90 tablet 1  . benzonatate (TESSALON) 100 MG capsule Take by mouth.    . carvedilol (COREG) 3.125 MG tablet Take one-half tablet by mouth twice daily 180 tablet 3  . cetirizine (ZYRTEC) 10 MG tablet Take 10 mg by mouth.    . cholecalciferol (VITAMIN D) 1000 UNITS tablet Take  1,000 Units by mouth daily.      . colestipol (COLESTID) 1 G tablet Take 1 g by mouth 3 (three) times daily. For diarrhea and does not take at the same time as coumadin    . Cyanocobalamin (RA VITAMIN B-12 TR) 1000 MCG TBCR Take by mouth.    . cycloSPORINE (RESTASIS) 0.05 % ophthalmic emulsion Place 1 drop into both eyes daily.      Marland Kitchen diltiazem (CARDIZEM) 30 MG tablet Take 1 tablet (30 mg total) by mouth 4 (four) times daily. 360 tablet 1  . fenofibrate micronized (LOFIBRA) 134 MG capsule Take 1 capsule (134 mg total) by mouth daily. 90 capsule 1  . Flaxseed, Linseed, (FLAXSEED OIL) 1000 MG CAPS Take 1,000-2,000 mg by mouth 2 (two) times daily. 2 @ lunch and 1 @ dinner    . gabapentin (NEURONTIN) 300 MG capsule TAKE 1 CAPSULE THREE TIMES DAILY AND 3 CAPSULES AT BEDTIME 540 capsule 1  . hydrALAZINE (APRESOLINE) 25 MG tablet Take 1 tablet (25 mg total) by mouth 3 (three) times daily. 270 tablet 3  . ipratropium (ATROVENT) 0.06 % nasal spray Place 1 spray into both nostrils as directed.     . Lactobacillus (ACIDOPHILUS) 100 MG CAPS Take 100 mg by mouth daily.     Marland Kitchen loperamide (IMODIUM) 2 MG capsule Take 1 capsule (2 mg total) by mouth 2 (two) times daily as needed for diarrhea or loose stools. 30 capsule 0  . memantine (NAMENDA) 10 MG tablet Take 1 tablet (10 mg total) by mouth 2 (two) times daily. 90 tablet 3  . Multiple Vitamin (MULTIVITAMIN) tablet Take 1 tablet by mouth daily.      . Multiple Vitamins-Minerals (VISION FORMULA/LUTEIN) TABS Take 1 tablet by mouth daily.    . pantoprazole (PROTONIX) 40 MG tablet Take 40 mg by mouth daily.     Marland Kitchen pyridOXINE (VITAMIN B-6) 100 MG tablet Take 100 mg by mouth daily.    . simethicone (MYLICON) 527 MG chewable tablet Chew 125 mg by mouth as needed for flatulence.    . warfarin (COUMADIN) 2 MG tablet TAKE 1 AND 1/2 TO 2 TABLETS BY MOUTH DAILY AS DIRECTED BY COUMADIN CLINIC 180 tablet 1   No current facility-administered medications for this visit.      Socially she is widowed. Has 2 children and 2 grandchildren. She does walk. Is no tobacco  or alcohol use. [facility] resides [facility]. She remains active.  ROS General: Negative; No fevers, chills, or night sweats;  HEENT: Negative; No changes in vision or hearing, sinus congestion, difficulty swallowing Pulmonary: Negative; No cough, wheezing, hemoptysis Cardiovascular: See history of present illness  GI: Negative; No nausea, vomiting, diarrhea, or abdominal pain GU:  Recent UTI Musculoskeletal: Negative; no myalgias, joint pain, or weakness Hematologic/Oncology: Negative; no easy bruising, bleeding Endocrine: Negative; no heat/cold intolerance; no diabetes Neuro: Negative; no changes in balance, headaches Skin: Negative; No rashes or skin lesions Psychiatric: Negative; No behavioral problems, depression Sleep: Negative; No snoring, daytime sleepiness, hypersomnolence, bruxism, restless legs, hypnogognic hallucinations, no cataplexy Other comprehensive 14 point system review is negative.   PE BP 124/62   Pulse 66   Ht '5\' 2"'$  (1.575 m)   Wt 121 lb 6.4 oz (55.1 kg)   BMI 22.20 kg/m  Repeat blood pressure by me was 130/78  Wt Readings from Last 3 Encounters:  06/07/16 121 lb 6.4 oz (55.1 kg)  03/14/16 116 lb (52.6 kg)  12/20/15 119 lb 12.8 oz (54.3 kg)   General: Alert, oriented, no distress.  Skin: normal turgor, no rashes HEENT: Normocephalic, atraumatic. Pupils round and reactive; sclera anicteric;no lid lag.  Nose without nasal septal hypertrophy Mouth/Parynx benign; Mallinpatti scale 2  Neck: No JVD, trace prominent left carotid bruit; normal carotid upstroke Chest wall: No tenderness to palpation Lungs: clear to ausculatation and percussion; no wheezing or rales Heart: RRR, s1 s2 normal; 2/6 systolic murmur in aortic region And 2/6 murmur at the apex radiating to the axilla.  No S3 gallop.  No rubs, thrills or heaves.  No hepatic jugular reflux. Abdomen: soft,  nontender; no hepatosplenomehaly, BS+; abdominal aorta nontender and not dilated by palpation. Back: No CVA tenderness  Pulses 2+ Extremities: No ankle edema.  Trace edema above the compression sock line no clubbing cyanosis , Homan's sign negative  Neurologic: grossly nonfocal Psyhological: Normal affect and mood   ECG (independently read by me): Normal sinus rhythm at 66 bpm.  No ectopy.  Normal intervals.  Borderline LVH by voltage in aVL.  December 2016 ECG (independently read by me): Normal sinus rhythm at 78 bpm.  Normal intervals.  ECG (independently read by me):  Sinus rhythm with sinus arrhythmia at 80 bpm.  Normal intervals.  No ST segment changes.  April 2016ECG (independently read by me): Sinus bradycardia 58 bpm.  Left axis deviation.  LVH by voltage criteria in aVL.  No significant ST segment changes.  October 2015 ECG (independently read by me): Sinus bradycardia 48 beats per minute.  No ectopy.  PR interval 164 ms  Prior April 2015 ECG (independently by me) sinus bradycardia 50 beats per minute.  No ectopy.  Normal intervals.  No ST segment changes.  Prior 09/02/2013 ECG (independently read by me ): Sinus bradycardia at 51 beats per minute. Left axis deviation. No significant ST change. PR interval 160 ms. QTc interval 418 ms.   LABS:  BMP Latest Ref Rng & Units 11/20/2015 05/20/2015 05/12/2015  Glucose 65 - 99 mg/dL 89 88 73  BUN 6 - 20 mg/dL '12 16 12  '$ Creatinine 0.44 - 1.00 mg/dL 0.58 0.76 0.65  Sodium 135 - 145 mmol/L 141 139 135  Potassium 3.5 - 5.1 mmol/L 3.6 4.8 3.1(L)  Chloride 101 - 111 mmol/L 106 100 103  CO2 22 - 32 mmol/L '25 28 26  '$ Calcium 8.9 - 10.3 mg/dL 9.7 10.2 9.1    Hepatic  Function Latest Ref Rng & Units 11/20/2015 05/20/2015 05/08/2015  Total Protein 6.5 - 8.1 g/dL 7.8 7.4 6.1(L)  Albumin 3.5 - 5.0 g/dL 4.2 4.4 3.4(L)  AST 15 - 41 U/L 32 28 30  ALT 14 - 54 U/L '18 21 19  '$ Alk Phosphatase 38 - 126 U/L 40 42 27(L)  Total Bilirubin 0.3 - 1.2 mg/dL 0.2(L)  0.4 0.4    CBC Latest Ref Rng & Units 11/20/2015 05/12/2015 05/10/2015  WBC 4.0 - 10.5 K/uL 4.1 5.6 6.8  Hemoglobin 12.0 - 15.0 g/dL 11.9(L) 10.4(L) 11.2(L)  Hematocrit 36.0 - 46.0 % 34.7(L) 30.5(L) 33.3(L)  Platelets 150 - 400 K/uL 198 225 230   Lab Results  Component Value Date   MCV 83.4 11/20/2015   MCV 86.4 05/12/2015   MCV 88.8 05/10/2015   Lab Results  Component Value Date   TSH 0.154 (L) 03/05/2014   Lab Results  Component Value Date   HGBA1C 5.6 09/13/2014    BNP No results found for: PROBNP   Lipid Panel     Component Value Date/Time   CHOL 136 09/14/2014 0501   TRIG 119 09/14/2014 0501   HDL 50 09/14/2014 0501   CHOLHDL 2.7 09/14/2014 0501   VLDL 24 09/14/2014 0501   LDLCALC 62 09/14/2014 0501     RADIOLOGY: No results found.    ASSESSMENT AND PLAN: Ms. Paone is a 82 years old Caucasian female who has a history of hypertension, hyperlipidemia, as well as significant mitral annular calcification with MR.  During her hospitalization in 2016 she was noted to have paroxysmal atrial fibrillation with rapid ventricular response which may have been the inciting cause of her chest pain which awakened her from sleep.  Her echo Doppler study  and at that time showed significant pulmonary pressure elevation.  Cardiac catheterization did not reveal significant coronary obstructive disease.  Following diuresis she did not have significant pulmonary hypertension and her PA pressure on right heart catheterization was only 33 mm systolically.   Presently, her blood pressure is controlled on her current medical regimen consisting of hydralazine, low-dose diltiazem as well as very low-dose carvedilol.  Her resting pulse is 66.  She has not been aware of any recurrent atrial fibrillation  .  Her recent fall was a result of tripping over a step and was not associated with a prodrome of presyncope or arrhythmia.  She has been found to have mild aortic stenosis on her prior echo.   Her physical exam demonstrates both a rate aortic stenosis murmur as well as her MR murmur.  She is on atorvastatin 20 mg and fenofibrate for hyperlipidemia and denies myalgias.  Dr. Shelia Media has checked laboratory and I will try to obtain recent results.  She continues to take gabapentin for neuropathy.  I reviewed her head CT findings.  She denies any tenderness to her maxillary sinus.  I have recommended that in 6 months, he undergo a follow-up echo Doppler study, which will be 1-1/2 years since her last echo assessment.  I will see her back in the office follow-up of her echo and further recommendations will made at that time.  Time spent: 25 minutes  Troy Sine, MD, [facility]  06/07/2016 6:49 PM

## 2016-06-14 ENCOUNTER — Other Ambulatory Visit: Payer: Self-pay | Admitting: Cardiovascular Disease

## 2016-06-18 ENCOUNTER — Ambulatory Visit (INDEPENDENT_AMBULATORY_CARE_PROVIDER_SITE_OTHER): Payer: Medicare Other | Admitting: Pharmacist Clinician (PhC)/ Clinical Pharmacy Specialist

## 2016-06-18 DIAGNOSIS — Z7901 Long term (current) use of anticoagulants: Secondary | ICD-10-CM

## 2016-06-18 DIAGNOSIS — I480 Paroxysmal atrial fibrillation: Secondary | ICD-10-CM

## 2016-06-18 LAB — PROTIME-INR: INR: 3.2 — AB (ref 0.9–1.1)

## 2016-07-02 ENCOUNTER — Ambulatory Visit: Payer: Medicare Other

## 2016-07-02 DIAGNOSIS — I8310 Varicose veins of unspecified lower extremity with inflammation: Secondary | ICD-10-CM | POA: Diagnosis not present

## 2016-07-02 DIAGNOSIS — I480 Paroxysmal atrial fibrillation: Secondary | ICD-10-CM | POA: Diagnosis not present

## 2016-07-02 DIAGNOSIS — Z7901 Long term (current) use of anticoagulants: Secondary | ICD-10-CM | POA: Diagnosis not present

## 2016-07-02 LAB — PROTIME-INR: INR: 2.8 — AB (ref 0.9–1.1)

## 2016-07-03 ENCOUNTER — Ambulatory Visit (INDEPENDENT_AMBULATORY_CARE_PROVIDER_SITE_OTHER): Payer: Medicare Other | Admitting: Pharmacist Clinician (PhC)/ Clinical Pharmacy Specialist

## 2016-07-03 DIAGNOSIS — I480 Paroxysmal atrial fibrillation: Secondary | ICD-10-CM

## 2016-07-03 DIAGNOSIS — Z7901 Long term (current) use of anticoagulants: Secondary | ICD-10-CM

## 2016-07-04 ENCOUNTER — Ambulatory Visit (INDEPENDENT_AMBULATORY_CARE_PROVIDER_SITE_OTHER): Payer: Medicare Other | Admitting: Podiatry

## 2016-07-04 ENCOUNTER — Encounter: Payer: Self-pay | Admitting: Podiatry

## 2016-07-04 VITALS — Ht 62.0 in | Wt 121.0 lb

## 2016-07-04 DIAGNOSIS — B351 Tinea unguium: Secondary | ICD-10-CM

## 2016-07-04 DIAGNOSIS — M79675 Pain in left toe(s): Secondary | ICD-10-CM

## 2016-07-04 NOTE — Progress Notes (Signed)
Patient ID: [name], female   DOB: 12-May-1922, 82 y.o.   MRN: [id number] Complaint:  Visit Type: Patient returns to my office for continued preventative foot care services. Complaint: Patient states" my nails have grown long and thick and become painful to walk and wear shoes" Patient has been diagnosed with neuropathy.  . She presents to the office for preventive foot care services and says her ulcer hal healed with no pain or drainage.  .   Podiatric Exam: Vascular: dorsalis pedis and posterior tibial pulses are palpable bilateral. Capillary return is immediate. Temperature gradient is WNL. Skin turgor WNL  Sensorium: Normal Semmes Weinstein monofilament test. Normal tactile sensation bilaterally. Nail Exam: Pt has thick disfigured discolored nails with subungual debris noted bilateral entire nail hallux through fifth toes both feet. Ulcer Exam: There is no evidence of ulcer or pre-ulcerative changes or infection. Orthopedic Exam: Muscle tone and strength are WNL. No limitations in general ROM. No crepitus or effusions noted. Foot type and digits show no abnormalities. Bony prominences are unremarkable.Severe dorsiflexed second toe right foot. Skin: No Porokeratosis. No infection or ulcers  Diagnosis:  Tinea unguium, Pain in right toe, pain in left toes  Treatment & Plan Procedures and Treatment: Consent by patient was obtained for treatment procedures. The patient understood the discussion of treatment and procedures well. All questions were answered thoroughly reviewed. Debridement of mycotic and hypertrophic toenails, 1 through 5 bilateral and clearing of subungual debris. No ulceration, no infection noted.  Return Visit-Office Procedure: Patient instructed to return to the office for a follow up visit 10 weeks for continued evaluation and treatment.   Gardiner Barefoot DPM

## 2016-07-17 ENCOUNTER — Telehealth: Payer: Self-pay | Admitting: Cardiovascular Disease

## 2016-07-17 NOTE — Telephone Encounter (Signed)
Patient had a death in her family, will not be available for INR until Monday Dec 4.  Assured her that this will be fine.

## 2016-07-17 NOTE — Telephone Encounter (Signed)
New message  Pt is calling concerning scheduling coumadin appt  Please call back and advise

## 2016-07-23 ENCOUNTER — Ambulatory Visit (INDEPENDENT_AMBULATORY_CARE_PROVIDER_SITE_OTHER): Payer: Medicare Other | Admitting: Pharmacist Clinician (PhC)/ Clinical Pharmacy Specialist

## 2016-07-23 DIAGNOSIS — Z7901 Long term (current) use of anticoagulants: Secondary | ICD-10-CM | POA: Diagnosis not present

## 2016-07-23 DIAGNOSIS — I480 Paroxysmal atrial fibrillation: Secondary | ICD-10-CM | POA: Diagnosis not present

## 2016-07-23 LAB — PROTIME-INR: INR: 3.1 — AB (ref 0.9–1.1)

## 2016-07-26 DIAGNOSIS — Z85828 Personal history of other malignant neoplasm of skin: Secondary | ICD-10-CM | POA: Diagnosis not present

## 2016-07-26 DIAGNOSIS — D485 Neoplasm of uncertain behavior of skin: Secondary | ICD-10-CM | POA: Diagnosis not present

## 2016-07-26 DIAGNOSIS — C44622 Squamous cell carcinoma of skin of right upper limb, including shoulder: Secondary | ICD-10-CM | POA: Diagnosis not present

## 2016-07-30 DIAGNOSIS — E785 Hyperlipidemia, unspecified: Secondary | ICD-10-CM | POA: Diagnosis not present

## 2016-07-30 DIAGNOSIS — Z9181 History of falling: Secondary | ICD-10-CM | POA: Diagnosis not present

## 2016-07-30 DIAGNOSIS — N83202 Unspecified ovarian cyst, left side: Secondary | ICD-10-CM | POA: Diagnosis not present

## 2016-07-30 DIAGNOSIS — E559 Vitamin D deficiency, unspecified: Secondary | ICD-10-CM | POA: Diagnosis not present

## 2016-07-30 DIAGNOSIS — I10 Essential (primary) hypertension: Secondary | ICD-10-CM | POA: Diagnosis not present

## 2016-07-30 DIAGNOSIS — Z0000 Encounter for general adult medical examination without abnormal findings: Secondary | ICD-10-CM | POA: Diagnosis not present

## 2016-07-31 ENCOUNTER — Other Ambulatory Visit: Payer: Self-pay | Admitting: Internal Medicine

## 2016-07-31 DIAGNOSIS — N83202 Unspecified ovarian cyst, left side: Secondary | ICD-10-CM

## 2016-08-02 DIAGNOSIS — I10 Essential (primary) hypertension: Secondary | ICD-10-CM | POA: Diagnosis not present

## 2016-08-02 DIAGNOSIS — R197 Diarrhea, unspecified: Secondary | ICD-10-CM | POA: Diagnosis not present

## 2016-08-02 DIAGNOSIS — M542 Cervicalgia: Secondary | ICD-10-CM | POA: Diagnosis not present

## 2016-08-02 DIAGNOSIS — R05 Cough: Secondary | ICD-10-CM | POA: Diagnosis not present

## 2016-08-02 DIAGNOSIS — R490 Dysphonia: Secondary | ICD-10-CM | POA: Diagnosis not present

## 2016-08-06 ENCOUNTER — Ambulatory Visit
Admission: RE | Admit: 2016-08-06 | Discharge: 2016-08-06 | Disposition: A | Discharge status: Home / Self Care | Payer: Medicare Other | Source: Ambulatory Visit | Attending: Internal Medicine | Admitting: Internal Medicine

## 2016-08-06 ENCOUNTER — Ambulatory Visit (INDEPENDENT_AMBULATORY_CARE_PROVIDER_SITE_OTHER): Payer: Medicare Other | Admitting: Pharmacist

## 2016-08-06 DIAGNOSIS — N83202 Unspecified ovarian cyst, left side: Secondary | ICD-10-CM | POA: Diagnosis not present

## 2016-08-06 DIAGNOSIS — Z7901 Long term (current) use of anticoagulants: Secondary | ICD-10-CM

## 2016-08-06 DIAGNOSIS — I480 Paroxysmal atrial fibrillation: Secondary | ICD-10-CM

## 2016-08-06 LAB — PROTIME-INR: INR: 2.8 — AB (ref 0.9–1.1)

## 2016-08-07 DIAGNOSIS — M6281 Muscle weakness (generalized): Secondary | ICD-10-CM | POA: Diagnosis not present

## 2016-08-07 DIAGNOSIS — R2689 Other abnormalities of gait and mobility: Secondary | ICD-10-CM | POA: Diagnosis not present

## 2016-08-07 DIAGNOSIS — R2681 Unsteadiness on feet: Secondary | ICD-10-CM | POA: Diagnosis not present

## 2016-08-15 DIAGNOSIS — R2689 Other abnormalities of gait and mobility: Secondary | ICD-10-CM | POA: Diagnosis not present

## 2016-08-15 DIAGNOSIS — R2681 Unsteadiness on feet: Secondary | ICD-10-CM | POA: Diagnosis not present

## 2016-08-15 DIAGNOSIS — M6281 Muscle weakness (generalized): Secondary | ICD-10-CM | POA: Diagnosis not present

## 2016-08-16 DIAGNOSIS — R2689 Other abnormalities of gait and mobility: Secondary | ICD-10-CM | POA: Diagnosis not present

## 2016-08-16 DIAGNOSIS — M6281 Muscle weakness (generalized): Secondary | ICD-10-CM | POA: Diagnosis not present

## 2016-08-16 DIAGNOSIS — R2681 Unsteadiness on feet: Secondary | ICD-10-CM | POA: Diagnosis not present

## 2016-08-17 DIAGNOSIS — R2681 Unsteadiness on feet: Secondary | ICD-10-CM | POA: Diagnosis not present

## 2016-08-17 DIAGNOSIS — R2689 Other abnormalities of gait and mobility: Secondary | ICD-10-CM | POA: Diagnosis not present

## 2016-08-17 DIAGNOSIS — M6281 Muscle weakness (generalized): Secondary | ICD-10-CM | POA: Diagnosis not present

## 2016-08-21 DIAGNOSIS — N83202 Unspecified ovarian cyst, left side: Secondary | ICD-10-CM | POA: Diagnosis not present

## 2016-08-22 ENCOUNTER — Telehealth: Payer: Self-pay | Admitting: Nurse Practitioner

## 2016-08-22 DIAGNOSIS — R2681 Unsteadiness on feet: Secondary | ICD-10-CM | POA: Diagnosis not present

## 2016-08-22 DIAGNOSIS — R2689 Other abnormalities of gait and mobility: Secondary | ICD-10-CM | POA: Diagnosis not present

## 2016-08-22 NOTE — Telephone Encounter (Signed)
[facility] calling to request first available new patient apt per Dr. Lynnette Caffey. Patient with left adnexal mass, increasing in size. Apt made 08/24/16 with Dr. Fermin Schwab at [place] to call patient. Records requested.

## 2016-08-24 ENCOUNTER — Encounter: Payer: Self-pay | Admitting: Gynecology

## 2016-08-24 ENCOUNTER — Ambulatory Visit: Payer: Medicare Other | Attending: Gynecology | Admitting: Gynecology

## 2016-08-24 DIAGNOSIS — N83202 Unspecified ovarian cyst, left side: Secondary | ICD-10-CM | POA: Insufficient documentation

## 2016-08-24 DIAGNOSIS — Z87891 Personal history of nicotine dependence: Secondary | ICD-10-CM | POA: Diagnosis not present

## 2016-08-24 DIAGNOSIS — Z79899 Other long term (current) drug therapy: Secondary | ICD-10-CM | POA: Diagnosis not present

## 2016-08-24 DIAGNOSIS — Z90710 Acquired absence of both cervix and uterus: Secondary | ICD-10-CM | POA: Diagnosis not present

## 2016-08-24 DIAGNOSIS — Z9889 Other specified postprocedural states: Secondary | ICD-10-CM | POA: Insufficient documentation

## 2016-08-24 DIAGNOSIS — Z5189 Encounter for other specified aftercare: Secondary | ICD-10-CM | POA: Insufficient documentation

## 2016-08-24 NOTE — Patient Instructions (Signed)
Please return to Dr. Lynnette Caffey in 1 year for repeat ultrasound.

## 2016-08-24 NOTE — Progress Notes (Signed)
Consult Note: Gyn-Onc   [name] 83 y.o. female  Chief Complaint  Patient presents with  . Ovarian Cyst    Assessment :Left ovarian cyst documented on serial imaging since December 2014. Normal CA-125 values. Despite the fact that the cyst has gotten slightly larger over the past 3 years and now has a small nodule, I believe it is benign. The patient is entirely asymptomatic. Therefore would recommend the patient repeat ultrasound in one year especially to follow the nodule.  I reviewed all the imaging studies and laboratory work at the patient and her son and all her questions are answered. They'll return to the care of Dr. Lynnette Caffey to have a repeat ultrasound in one year. The patient's encouraged to let us know she develops any abdominal or pelvic symptoms.  HPI: 84 year old white widowed female para 1 seen in consultation request of Dr. [name] regarding management of a left ovarian cyst. Review the patient's records reveals that she has had serial imaging since December 2014. At that time she was found to have a simple cyst measuring 3.7 x 3.7 x 3.0 cm. In November 2016 and ultrasound showed the cyst measuring 4.6 x 3.6 x 4.5 cm. There is no nodularity or free fluid. MRI on 08/04/2015 reaffirmed the ultrasound findings. On 08/06/2016 the patient had another ultrasound showing slight increased size and the cyst now measuring 5.2 x 4.0 x 4.6 cm. There was a 1.4 cm nodule. Recent CA-125 was 9 units per mL and a year prior was 11 units per mL.  The patient is entirely asymptomatic. She denies any pelvic pain or pressure or any other GI or GU symptoms.  She has a past history of having an abdominal hysterectomy and uterine suspension.  Review of Systems:10 point review of systems is negative except as noted in interval history.   Vitals: There were no vitals taken for this visit.  Physical Exam: General : The patient is a healthy woman in no acute distress.  HEENT:  normocephalic, extraoccular movements normal; neck is supple without thyromegally  Lynphnodes: Supraclavicular and inguinal nodes not enlarged  Abdomen: Soft, non-tender, no ascites, no organomegally, no masses, no hernias , she has a midline incision as well as a subcostal incision and a incision at McBurney's point.   Lower extremities: No edema or varicosities. Normal range of motion      Allergies  Allergen Reactions  . Avelox [Moxifloxacin Hcl In Nacl] Shortness Of Breath and Swelling  . Moxifloxacin Other (See Comments), Shortness Of Breath and Swelling    other  . Aricept [Donepezil Hcl] Diarrhea  . Codeine Swelling       . Donepezil Diarrhea  . Garlic Other (See Comments)    Gi problems  . Latex Hives, Itching, Rash and Other (See Comments)    Watery blisters   . Onion Other (See Comments)    Gi problems  . Sulfa Drugs Cross Reactors Swelling  . Other Other (See Comments)    Her throat closes up  . Duloxetine Rash  . Polysporin [Bacitracin-Polymyxin B] Other (See Comments)    other    Past Medical History:  Diagnosis Date  . Abnormality of gait 09/07/2015  . Anemia, iron deficiency 09/16/2014  . Anxiety   . CAD (coronary artery disease)    a. 08/2014 NSTEMI/Cath: mild Ca2+ or RCA ostium, otw nl cors. CO 3.3 L/min (thermo), 3.7 L/min (Fick).  . Carotid arterial disease ([place]) 07/30/2012   carotid doppler; normal study  . Chronic  anticoagulation, with coumadin, with PAF and CHADS2Vasc2 score of 4 09/16/2014  . Degenerative arthritis   . Diverticulosis   . Essential hypertension   . Foot fracture, left   . GERD (gastroesophageal reflux disease)   . History of nuclear stress test 09/19/2007   normal pattern of perfusion; post-stress EF 86%; EKG negative for ischemia; low risk scan  . Hyperlipidemia   . Left carotid bruit    a. 07/2015 Carotid U/S: 1-39% bilat ICA stenosis.  . Memory disorder 03/05/2014  . Mild renal insufficiency   . Mitral prolapse   . Neuropathy  ([place])   . PAF (paroxysmal atrial fibrillation) (HCC)    a. 08/2014-->Coumadin (CHA2DS2VASc = 4).  . Peripheral neuropathy (HCC)    Small fiber   . Pre-syncope    a. 04/2015 in setting of bradycardia-->CCB/BB doses adjusted.  . Pulmonary hypertension   . Valvular heart disease    a. 04/2015 Echo: EF 65-70%, no rwma, Gr1 DD, mild AS, triv AI, mild MS, mod MR, mod dil LA, mod TR, sev increased PASP.    Past Surgical History:  Procedure Laterality Date  .  Bilateral bunionectomies    . ABDOMINAL HYSTERECTOMY    . APPENDECTOMY    . DILATION AND CURETTAGE OF UTERUS    . FOOT FRACTURE SURGERY Left   . gallbladder resection    . LEFT HEART CATHETERIZATION WITH CORONARY ANGIOGRAM N/A 09/14/2014   Procedure: LEFT HEART CATHETERIZATION WITH CORONARY ANGIOGRAM;  Surgeon: Troy Sine, MD;  Location: [facility] CATH LAB;  Service: Cardiovascular;  Laterality: N/A;  . REPLACEMENT TOTAL KNEE Right   . resuspension procedure    . TEAR DUCT PROBING WITH STRABISMUS REPAIR     Tear duct repair surgery  . TONSILLECTOMY    . UMBILICAL HERNIA REPAIR      Current Outpatient Prescriptions  Medication Sig Dispense Refill  . acetaminophen (TYLENOL) 500 MG tablet Take 500 mg by mouth.    Marland Kitchen atorvastatin (LIPITOR) 20 MG tablet Take 1 tablet (20 mg total) by mouth at bedtime. 90 tablet 1  . benzonatate (TESSALON) 100 MG capsule Take by mouth.    . carvedilol (COREG) 3.125 MG tablet Take one-half tablet by mouth twice daily 180 tablet 3  . cetirizine (ZYRTEC) 10 MG tablet Take 10 mg by mouth.    . cholecalciferol (VITAMIN D) 1000 UNITS tablet Take 1,000 Units by mouth daily.      . colestipol (COLESTID) 1 G tablet Take 1 g by mouth 3 (three) times daily. For diarrhea and does not take at the same time as coumadin    . Cyanocobalamin (RA VITAMIN B-12 TR) 1000 MCG TBCR Take by mouth.    . cycloSPORINE (RESTASIS) 0.05 % ophthalmic emulsion Place 1 drop into both eyes daily.      Marland Kitchen diltiazem (CARDIZEM SR) 60 MG 12 hr  capsule TAKE ONE CAPSULE BY MOUTH EVERY 12 HOURS 180 capsule 1  . fenofibrate micronized (LOFIBRA) 134 MG capsule Take 1 capsule (134 mg total) by mouth daily. 90 capsule 1  . Flaxseed, Linseed, (FLAXSEED OIL) 1000 MG CAPS Take 1,000-2,000 mg by mouth 2 (two) times daily. 2 @ lunch and 1 @ dinner    . gabapentin (NEURONTIN) 300 MG capsule TAKE 1 CAPSULE THREE TIMES DAILY AND 3 CAPSULES AT BEDTIME 540 capsule 1  . hydrALAZINE (APRESOLINE) 25 MG tablet Take 1 tablet (25 mg total) by mouth 3 (three) times daily. 270 tablet 3  . hydrochlorothiazide (HYDRODIURIL) 25 MG tablet     .  ipratropium (ATROVENT) 0.06 % nasal spray Place 1 spray into both nostrils as directed.     . Lactobacillus (ACIDOPHILUS) 100 MG CAPS Take 100 mg by mouth daily.     Marland Kitchen loperamide (IMODIUM) 2 MG capsule Take 1 capsule (2 mg total) by mouth 2 (two) times daily as needed for diarrhea or loose stools. 30 capsule 0  . memantine (NAMENDA) 10 MG tablet Take 1 tablet (10 mg total) by mouth 2 (two) times daily. 90 tablet 3  . Multiple Vitamin (MULTIVITAMIN) tablet Take 1 tablet by mouth daily.      . Multiple Vitamins-Minerals (VISION FORMULA/LUTEIN) TABS Take 1 tablet by mouth daily.    . pantoprazole (PROTONIX) 40 MG tablet Take 40 mg by mouth daily.     Marland Kitchen pyridOXINE (VITAMIN B-6) 100 MG tablet Take 100 mg by mouth daily.    . ranitidine (ZANTAC) 150 MG tablet     . simethicone (MYLICON) 0000000 MG chewable tablet Chew 125 mg by mouth as needed for flatulence.    . warfarin (COUMADIN) 2 MG tablet TAKE 1 AND 1/2 TO 2 TABLETS BY MOUTH DAILY AS DIRECTED BY COUMADIN CLINIC 180 tablet 1   No current facility-administered medications for this visit.     Social History   Social History  . Marital status: Widowed    Spouse name: N/A  . Number of children: 2  . Years of education: AS   Occupational History  . Retired    Social History Main Topics  . Smoking status: Former Research scientist (life sciences)  . Smokeless tobacco: Never Used  . Alcohol use No   . Drug use: No  . Sexual activity: No   Other Topics Concern  . Not on file   Social History Narrative   Resides at [facility]   Patient is left handed, but uses right handed.   Patient drinks one cup caffeine daily.       Family History  Problem Relation Age of Onset  . Pneumonia Mother   . Coronary artery disease Mother   . Hypertension Mother   . Heart disease Father   . Cancer Father   . Hypertension Father   . Arthritis Sister       Marti Sleigh, MD 08/24/2016, 12:12 PM

## 2016-08-27 ENCOUNTER — Ambulatory Visit (INDEPENDENT_AMBULATORY_CARE_PROVIDER_SITE_OTHER): Payer: Medicare Other | Admitting: Pharmacist Clinician (PhC)/ Clinical Pharmacy Specialist

## 2016-08-27 DIAGNOSIS — Z7901 Long term (current) use of anticoagulants: Secondary | ICD-10-CM | POA: Diagnosis not present

## 2016-08-27 DIAGNOSIS — R2681 Unsteadiness on feet: Secondary | ICD-10-CM | POA: Diagnosis not present

## 2016-08-27 DIAGNOSIS — R2689 Other abnormalities of gait and mobility: Secondary | ICD-10-CM | POA: Diagnosis not present

## 2016-08-27 DIAGNOSIS — I480 Paroxysmal atrial fibrillation: Secondary | ICD-10-CM | POA: Diagnosis not present

## 2016-08-27 LAB — PROTIME-INR: INR: 3.7 — AB (ref 0.9–1.1)

## 2016-08-29 DIAGNOSIS — R2689 Other abnormalities of gait and mobility: Secondary | ICD-10-CM | POA: Diagnosis not present

## 2016-08-29 DIAGNOSIS — R2681 Unsteadiness on feet: Secondary | ICD-10-CM | POA: Diagnosis not present

## 2016-08-30 DIAGNOSIS — Z85828 Personal history of other malignant neoplasm of skin: Secondary | ICD-10-CM | POA: Diagnosis not present

## 2016-08-30 DIAGNOSIS — K219 Gastro-esophageal reflux disease without esophagitis: Secondary | ICD-10-CM | POA: Diagnosis not present

## 2016-08-30 DIAGNOSIS — E7800 Pure hypercholesterolemia, unspecified: Secondary | ICD-10-CM | POA: Diagnosis not present

## 2016-08-30 DIAGNOSIS — L720 Epidermal cyst: Secondary | ICD-10-CM | POA: Diagnosis not present

## 2016-08-30 DIAGNOSIS — R0982 Postnasal drip: Secondary | ICD-10-CM | POA: Diagnosis not present

## 2016-08-30 DIAGNOSIS — R413 Other amnesia: Secondary | ICD-10-CM | POA: Diagnosis not present

## 2016-08-30 DIAGNOSIS — L2089 Other atopic dermatitis: Secondary | ICD-10-CM | POA: Diagnosis not present

## 2016-08-30 DIAGNOSIS — L570 Actinic keratosis: Secondary | ICD-10-CM | POA: Diagnosis not present

## 2016-08-31 DIAGNOSIS — R2689 Other abnormalities of gait and mobility: Secondary | ICD-10-CM | POA: Diagnosis not present

## 2016-08-31 DIAGNOSIS — R2681 Unsteadiness on feet: Secondary | ICD-10-CM | POA: Diagnosis not present

## 2016-09-03 DIAGNOSIS — I10 Essential (primary) hypertension: Secondary | ICD-10-CM | POA: Diagnosis not present

## 2016-09-03 DIAGNOSIS — N83209 Unspecified ovarian cyst, unspecified side: Secondary | ICD-10-CM | POA: Diagnosis not present

## 2016-09-05 ENCOUNTER — Other Ambulatory Visit: Payer: Self-pay | Admitting: Cardiovascular Disease

## 2016-09-07 DIAGNOSIS — R2681 Unsteadiness on feet: Secondary | ICD-10-CM | POA: Diagnosis not present

## 2016-09-07 DIAGNOSIS — R2689 Other abnormalities of gait and mobility: Secondary | ICD-10-CM | POA: Diagnosis not present

## 2016-09-07 NOTE — Telephone Encounter (Signed)
Rx(s) sent to pharmacy electronically.  

## 2016-09-10 ENCOUNTER — Ambulatory Visit (INDEPENDENT_AMBULATORY_CARE_PROVIDER_SITE_OTHER): Payer: Medicare Other | Admitting: Pharmacist

## 2016-09-10 DIAGNOSIS — R2681 Unsteadiness on feet: Secondary | ICD-10-CM | POA: Diagnosis not present

## 2016-09-10 DIAGNOSIS — I480 Paroxysmal atrial fibrillation: Secondary | ICD-10-CM

## 2016-09-10 DIAGNOSIS — Z7901 Long term (current) use of anticoagulants: Secondary | ICD-10-CM | POA: Diagnosis not present

## 2016-09-10 DIAGNOSIS — R2689 Other abnormalities of gait and mobility: Secondary | ICD-10-CM | POA: Diagnosis not present

## 2016-09-10 LAB — PROTIME-INR: INR: 2.7 — AB (ref 0.9–1.1)

## 2016-09-11 DIAGNOSIS — J019 Acute sinusitis, unspecified: Secondary | ICD-10-CM | POA: Diagnosis not present

## 2016-09-12 ENCOUNTER — Ambulatory Visit (INDEPENDENT_AMBULATORY_CARE_PROVIDER_SITE_OTHER): Payer: Medicare Other | Admitting: Podiatry

## 2016-09-12 ENCOUNTER — Telehealth: Payer: Self-pay | Admitting: Cardiovascular Disease

## 2016-09-12 ENCOUNTER — Encounter: Payer: Self-pay | Admitting: Podiatry

## 2016-09-12 VITALS — Ht 62.0 in | Wt 121.0 lb

## 2016-09-12 DIAGNOSIS — B351 Tinea unguium: Secondary | ICD-10-CM

## 2016-09-12 DIAGNOSIS — M79675 Pain in left toe(s): Secondary | ICD-10-CM | POA: Diagnosis not present

## 2016-09-12 DIAGNOSIS — M79674 Pain in right toe(s): Secondary | ICD-10-CM

## 2016-09-12 DIAGNOSIS — L84 Corns and callosities: Secondary | ICD-10-CM | POA: Diagnosis not present

## 2016-09-12 NOTE — Telephone Encounter (Signed)
Follow up    Pt verbalized that she is calling to speak back to the rn about her nasal infection

## 2016-09-12 NOTE — Telephone Encounter (Signed)
Please call,pt has a cold,she wants to know if it is alright to take some of the medicine she was prescribed.

## 2016-09-12 NOTE — Progress Notes (Signed)
Patient ID: [name], female   DOB: Jul 21, 1922, 83 y.o.   MRN: [id number] Complaint:  Visit Type: Patient returns to my office for continued preventative foot care services. Complaint: Patient states" my nails have grown long and thick and become painful to walk and wear shoes" Patient has been diagnosed with neuropathy.  . She presents to the office for preventive foot care services. She says she has developed a painful callus under right big toe .  She has been taking physical therapy and they have told her to change her gait.  This has led to redevelopment of pre-ulcerous lesion right foot. .   Podiatric Exam: Vascular: dorsalis pedis and posterior tibial pulses are palpable bilateral. Capillary return is immediate. Temperature gradient is WNL. Skin turgor WNL  Sensorium: Normal Semmes Weinstein monofilament test. Normal tactile sensation bilaterally. Nail Exam: Pt has thick disfigured discolored nails with subungual debris noted bilateral entire nail hallux through fifth toes both feet. Ulcer Exam: There is no evidence of ulcer or pre-ulcerative changes or infection. Orthopedic Exam: Muscle tone and strength are WNL. No limitations in general ROM. No crepitus or effusions noted. Foot type and digits show no abnormalities. Bony prominences are unremarkable.Severe dorsiflexed second toe right foot. Severe HAV deformity right foot. Skin: No Porokeratosis. No infection or ulcers.  Pre-ulcerous lesion right foot.  Diagnosis:  Tinea unguium, Pain in right toe, pain in left toes,  Preulcerous skin lesion right foot.  Treatment & Plan Procedures and Treatment: Consent by patient was obtained for treatment procedures. The patient understood the discussion of treatment and procedures well. All questions were answered thoroughly reviewed. Debridement of mycotic and hypertrophic toenails, 1 through 5 bilateral and clearing of subungual debris. No ulceration, no infection noted. Debridement of callus  sub 1 right foot. Return Visit-Office Procedure: Patient instructed to return to the office for a follow up visit 9 weeks for continued evaluation and treatment.   Gardiner Barefoot DPM

## 2016-09-12 NOTE — Telephone Encounter (Signed)
Spoke to patient.  She's on several meds for sinusitis /URI.  Notes she was prescribed flonase, mucinex, phenylephrine 1% nasal solution, and an antibiotic -cefuroxime - for treatment.  Informed patient I would seek review on these medications from pharmD and follow up w her, since there is potential for interaction on her coumadin and other prescribed meds. Pt voiced thanks.

## 2016-09-13 ENCOUNTER — Telehealth: Payer: Self-pay | Admitting: Cardiovascular Disease

## 2016-09-13 NOTE — Telephone Encounter (Signed)
I spoke to this patient about her cold meds yesterday, had sent an inquiry about it to pharmD. Her antibiotics were deemed safe for use, but she still needs clarification for her phenylephrine HCL. Safe for use? She's using this in nasal spray form.

## 2016-09-13 NOTE — Telephone Encounter (Signed)
Ms. Dood is calling because she states that she was prescribed  phenylephrine HCL. She also states that she read the warning and it advised her to "consult with a  doctor if you have heart disease." So she has some questions about her diagnosis, please call. Thanks.

## 2016-09-13 NOTE — Telephone Encounter (Signed)
LMOM advising not to use more than 3 days.  Otherwise can cause rebound congestion, also may cause increase in HR.

## 2016-09-13 NOTE — Telephone Encounter (Signed)
Spoke with patient, she started Ceftin on Tuesday, will take for 10 days.  Asked that she repeat INR next Monday, although we don't usually see any problems with Ceftin.  Patient agreed and will have drawn.

## 2016-09-15 ENCOUNTER — Telehealth: Payer: Self-pay | Admitting: Neurology

## 2016-09-17 ENCOUNTER — Ambulatory Visit (INDEPENDENT_AMBULATORY_CARE_PROVIDER_SITE_OTHER): Payer: Medicare Other | Admitting: Pharmacist Clinician (PhC)/ Clinical Pharmacy Specialist

## 2016-09-17 DIAGNOSIS — I480 Paroxysmal atrial fibrillation: Secondary | ICD-10-CM

## 2016-09-17 DIAGNOSIS — Z7901 Long term (current) use of anticoagulants: Secondary | ICD-10-CM

## 2016-09-17 LAB — PROTIME-INR: INR: 1.5 — AB (ref 0.9–1.1)

## 2016-09-18 ENCOUNTER — Telehealth: Payer: Self-pay | Admitting: Gynecology

## 2016-09-18 NOTE — Telephone Encounter (Signed)
FAXED OFFICE NOTE TO [facility].

## 2016-09-19 ENCOUNTER — Ambulatory Visit (INDEPENDENT_AMBULATORY_CARE_PROVIDER_SITE_OTHER): Payer: Medicare Other | Admitting: Neurology

## 2016-09-19 ENCOUNTER — Encounter: Payer: Self-pay | Admitting: Neurology

## 2016-09-19 VITALS — BP 127/74 | HR 59 | Ht 62.0 in | Wt 118.0 lb

## 2016-09-19 DIAGNOSIS — R2689 Other abnormalities of gait and mobility: Secondary | ICD-10-CM | POA: Diagnosis not present

## 2016-09-19 DIAGNOSIS — G63 Polyneuropathy in diseases classified elsewhere: Secondary | ICD-10-CM

## 2016-09-19 DIAGNOSIS — R2681 Unsteadiness on feet: Secondary | ICD-10-CM | POA: Diagnosis not present

## 2016-09-19 DIAGNOSIS — R413 Other amnesia: Secondary | ICD-10-CM | POA: Diagnosis not present

## 2016-09-19 NOTE — Progress Notes (Signed)
Reason for visit: Peripheral neuropathy  [name] is an 83 y.o. female  History of present illness:  [name] is a 83 year old right-handed white female with a history of a peripheral neuropathy associated with a gait disorder. The patient also has a mild memory disorder. The patient has been placed on Namenda, she is tolerating this fairly well. She has had a couple of falls since last seen, one fall was around 06/04/2016. The patient fell backwards and struck the back of her head, CT scan of the brain was done and did not show any acute trauma. The patient had a second fall around Christmas time of December 2017. She currently is in physical therapy for gait training, she is doing well with this. Her neuropathy pain is under relatively good control. When she first goes to bed the pain is quite significant, but within just a few moments, the pain dissipates and she is able to get good sleep and rest. She remains on gabapentin. She has a cane for ambulation, she does not use this consistently, however. The patient returns for an evaluation. The patient comes to the office with her son today, the son indicates that there has been a slow gradual progression of the memory issues. The patient has not given up doing anything because of memory since last seen.  Past Medical History:  Diagnosis Date  . Abnormality of gait 09/07/2015  . Anemia, iron deficiency 09/16/2014  . Anxiety   . CAD (coronary artery disease)    a. 08/2014 NSTEMI/Cath: mild Ca2+ or RCA ostium, otw nl cors. CO 3.3 L/min (thermo), 3.7 L/min (Fick).  . Carotid arterial disease ([place]) 07/30/2012   carotid doppler; normal study  . Chronic anticoagulation, with coumadin, with PAF and CHADS2Vasc2 score of 4 09/16/2014  . Degenerative arthritis   . Diverticulosis   . Essential hypertension   . Foot fracture, left   . GERD (gastroesophageal reflux disease)   . History of nuclear stress test 09/19/2007   normal pattern of  perfusion; post-stress EF 86%; EKG negative for ischemia; low risk scan  . Hyperlipidemia   . Left carotid bruit    a. 07/2015 Carotid U/S: 1-39% bilat ICA stenosis.  . Memory disorder 03/05/2014  . Mild renal insufficiency   . Mitral prolapse   . Neuropathy ([place])   . PAF (paroxysmal atrial fibrillation) (HCC)    a. 08/2014-->Coumadin (CHA2DS2VASc = 4).  . Peripheral neuropathy (HCC)    Small fiber   . Pre-syncope    a. 04/2015 in setting of bradycardia-->CCB/BB doses adjusted.  . Pulmonary hypertension   . Valvular heart disease    a. 04/2015 Echo: EF 65-70%, no rwma, Gr1 DD, mild AS, triv AI, mild MS, mod MR, mod dil LA, mod TR, sev increased PASP.    Past Surgical History:  Procedure Laterality Date  .  Bilateral bunionectomies    . ABDOMINAL HYSTERECTOMY    . APPENDECTOMY    . DILATION AND CURETTAGE OF UTERUS    . FOOT FRACTURE SURGERY Left   . gallbladder resection    . LEFT HEART CATHETERIZATION WITH CORONARY ANGIOGRAM N/A 09/14/2014   Procedure: LEFT HEART CATHETERIZATION WITH CORONARY ANGIOGRAM;  Surgeon: Troy Sine, MD;  Location: [facility] CATH LAB;  Service: Cardiovascular;  Laterality: N/A;  . REPLACEMENT TOTAL KNEE Right   . resuspension procedure    . TEAR DUCT PROBING WITH STRABISMUS REPAIR     Tear duct repair surgery  . TONSILLECTOMY    .  UMBILICAL HERNIA REPAIR      Family History  Problem Relation Age of Onset  . Pneumonia Mother   . Coronary artery disease Mother   . Hypertension Mother   . Heart disease Father   . Cancer Father   . Hypertension Father   . Arthritis Sister     Social history:  reports that she has quit smoking. She has never used smokeless tobacco. She reports that she does not drink alcohol or use drugs.    Allergies  Allergen Reactions  . Avelox [Moxifloxacin Hcl In Nacl] Shortness Of Breath and Swelling  . Moxifloxacin Other (See Comments), Shortness Of Breath and Swelling    other  . Aricept [Donepezil Hcl] Diarrhea  . Codeine  Swelling       . Donepezil Diarrhea  . Garlic Other (See Comments)    Gi problems  . Latex Hives, Itching, Rash and Other (See Comments)    Watery blisters   . Onion Other (See Comments)    Gi problems  . Sulfa Drugs Cross Reactors Swelling  . Other Other (See Comments)    Her throat closes up  . Duloxetine Rash  . Polysporin [Bacitracin-Polymyxin B] Other (See Comments)    other    Medications:  Prior to Admission medications   Medication Sig Start Date End Date Taking? Authorizing Provider  acetaminophen (TYLENOL) 500 MG tablet Take 500 mg by mouth.   Yes Historical Provider, MD  atorvastatin (LIPITOR) 20 MG tablet Take 1 tablet (20 mg total) by mouth at bedtime. 03/21/16  Yes Troy Sine, MD  carvedilol (COREG) 3.125 MG tablet Take one-half tablet by mouth twice daily 08/18/15  Yes Troy Sine, MD  cetirizine (ZYRTEC) 10 MG tablet Take 10 mg by mouth.   Yes Historical Provider, MD  cholecalciferol (VITAMIN D) 1000 UNITS tablet Take 1,000 Units by mouth daily.     Yes Historical Provider, MD  colestipol (COLESTID) 1 G tablet Take 1 g by mouth 3 (three) times daily. For diarrhea and does not take at the same time as coumadin   Yes Historical Provider, MD  Cyanocobalamin (RA VITAMIN B-12 TR) 1000 MCG TBCR Take by mouth.   Yes Historical Provider, MD  cycloSPORINE (RESTASIS) 0.05 % ophthalmic emulsion Place 1 drop into both eyes daily.     Yes Historical Provider, MD  diltiazem (CARDIZEM SR) 60 MG 12 hr capsule TAKE ONE CAPSULE BY MOUTH EVERY 12 HOURS 06/15/16  Yes Troy Sine, MD  fenofibrate micronized (LOFIBRA) 134 MG capsule TAKE ONE CAPSULE BY MOUTH ONCE DAILY 09/07/16  Yes Troy Sine, MD  gabapentin (NEURONTIN) 300 MG capsule TAKE 1 CAPSULE THREE TIMES DAILY AND 3 CAPSULES AT BEDTIME 11/03/15  Yes Carmen Dohmeier, MD  hydrALAZINE (APRESOLINE) 25 MG tablet Take 1 tablet (25 mg total) by mouth 3 (three) times daily. 01/26/16  Yes Troy Sine, MD  hydrochlorothiazide  (HYDRODIURIL) 25 MG tablet  07/13/16  Yes Historical Provider, MD  ipratropium (ATROVENT) 0.06 % nasal spray Place 1 spray into both nostrils as directed.  11/17/15  Yes Historical Provider, MD  Lactobacillus (ACIDOPHILUS) 100 MG CAPS Take 100 mg by mouth daily.    Yes Historical Provider, MD  loperamide (IMODIUM) 2 MG capsule Take 1 capsule (2 mg total) by mouth 2 (two) times daily as needed for diarrhea or loose stools. 05/12/15  Yes Domenic Polite, MD  memantine (NAMENDA) 10 MG tablet Take 1 tablet (10 mg total) by mouth 2 (two) times daily. 05/10/16  Yes Kathrynn Ducking, MD  Multiple Vitamin (MULTIVITAMIN) tablet Take 1 tablet by mouth daily.     Yes Historical Provider, MD  Multiple Vitamins-Minerals (VISION FORMULA/LUTEIN) TABS Take 1 tablet by mouth daily.   Yes Historical Provider, MD  Omega-3 Fatty Acids (FISH OIL PO) Take by mouth daily.   Yes Historical Provider, MD  pantoprazole (PROTONIX) 40 MG tablet Take 40 mg by mouth daily.  11/15/15  Yes Historical Provider, MD  pyridOXINE (VITAMIN B-6) 100 MG tablet Take 100 mg by mouth daily.   Yes Historical Provider, MD  ranitidine (ZANTAC) 150 MG tablet  07/26/16  Yes Historical Provider, MD  simethicone (MYLICON) 0000000 MG chewable tablet Chew 125 mg by mouth as needed for flatulence.   Yes Historical Provider, MD  warfarin (COUMADIN) 2 MG tablet TAKE 1 AND 1/2 TO 2 TABLETS BY MOUTH DAILY AS DIRECTED BY COUMADIN CLINIC 03/21/16  Yes Troy Sine, MD    ROS:  Out of a complete 14 system review of symptoms, the patient complains only of the following symptoms, and all other reviewed systems are negative.  Runny nose Heart murmur Environmental allergies, food allergies Joint pain, back pain, achy muscles, walking difficulty Numbness  Blood pressure 127/74, pulse (!) 59, height 5\' 2"  (1.575 m), weight 118 lb (53.5 kg).  Physical Exam  General: The patient is alert and cooperative at the time of the examination.  Skin: No significant  peripheral edema is noted.   Neurologic Exam  Mental status: The patient is alert and oriented x 3 at the time of the examination. The patient has apparent normal recent and remote memory, with an apparently normal attention span and concentration ability. Mini-Mental Status Examination done today shows a total score 28/30.   Cranial nerves: Facial symmetry is present. Speech is normal, no aphasia or dysarthria is noted. Extraocular movements are full. Visual fields are full.  Motor: The patient has good strength in all 4 extremities.  Sensory examination: Soft touch sensation is symmetric on the face, arms, and legs.  Coordination: The patient has good finger-nose-finger and heel-to-shin bilaterally.  Gait and station: The patient has a minimally wide-based gait, the patient can walk independently. Romberg is negative. No drift is seen.  Reflexes: Deep tendon reflexes are symmetric, but are depressed.   Assessment/Plan:  1. Peripheral neuropathy  2. Memory disturbance  3. Gait disturbance  The patient is currently in physical therapy, she will continue this for now. She will remain on the Namenda and gabapentin. We will follow-up in about 6 months, sooner if needed. The patient needs to be as safe as possible, I have recommended using a cane when she is outside the house.  Jill Alexanders MD 09/19/2016 3:15 PM  [facility] [address] [place],  [address]  Phone [phone] Fax [phone]

## 2016-09-21 ENCOUNTER — Telehealth: Payer: Self-pay | Admitting: Pharmacist

## 2016-09-21 DIAGNOSIS — M6281 Muscle weakness (generalized): Secondary | ICD-10-CM | POA: Diagnosis not present

## 2016-09-21 DIAGNOSIS — R2689 Other abnormalities of gait and mobility: Secondary | ICD-10-CM | POA: Diagnosis not present

## 2016-09-21 NOTE — Telephone Encounter (Signed)
Taking cefuroxime 500 mg po twice daily.  Will order additional INR for 09/24/16. Fax sent to [facility] @ [phone]

## 2016-09-26 DIAGNOSIS — R2689 Other abnormalities of gait and mobility: Secondary | ICD-10-CM | POA: Diagnosis not present

## 2016-09-26 DIAGNOSIS — M6281 Muscle weakness (generalized): Secondary | ICD-10-CM | POA: Diagnosis not present

## 2016-09-27 ENCOUNTER — Ambulatory Visit (INDEPENDENT_AMBULATORY_CARE_PROVIDER_SITE_OTHER): Payer: Medicare Other | Admitting: Pharmacist

## 2016-09-27 DIAGNOSIS — Z7901 Long term (current) use of anticoagulants: Secondary | ICD-10-CM | POA: Diagnosis not present

## 2016-09-27 DIAGNOSIS — I480 Paroxysmal atrial fibrillation: Secondary | ICD-10-CM | POA: Diagnosis not present

## 2016-09-27 LAB — POCT INR: INR: 1.9

## 2016-09-28 DIAGNOSIS — R2689 Other abnormalities of gait and mobility: Secondary | ICD-10-CM | POA: Diagnosis not present

## 2016-09-28 DIAGNOSIS — M6281 Muscle weakness (generalized): Secondary | ICD-10-CM | POA: Diagnosis not present

## 2016-10-03 DIAGNOSIS — R2689 Other abnormalities of gait and mobility: Secondary | ICD-10-CM | POA: Diagnosis not present

## 2016-10-03 DIAGNOSIS — M6281 Muscle weakness (generalized): Secondary | ICD-10-CM | POA: Diagnosis not present

## 2016-10-08 DIAGNOSIS — R2689 Other abnormalities of gait and mobility: Secondary | ICD-10-CM | POA: Diagnosis not present

## 2016-10-08 DIAGNOSIS — M6281 Muscle weakness (generalized): Secondary | ICD-10-CM | POA: Diagnosis not present

## 2016-10-10 DIAGNOSIS — M6281 Muscle weakness (generalized): Secondary | ICD-10-CM | POA: Diagnosis not present

## 2016-10-10 DIAGNOSIS — R2689 Other abnormalities of gait and mobility: Secondary | ICD-10-CM | POA: Diagnosis not present

## 2016-10-11 ENCOUNTER — Other Ambulatory Visit: Payer: Self-pay | Admitting: Cardiovascular Disease

## 2016-10-11 ENCOUNTER — Ambulatory Visit (INDEPENDENT_AMBULATORY_CARE_PROVIDER_SITE_OTHER): Payer: Medicare Other | Admitting: Pharmacist Clinician (PhC)/ Clinical Pharmacy Specialist

## 2016-10-11 DIAGNOSIS — I480 Paroxysmal atrial fibrillation: Secondary | ICD-10-CM | POA: Diagnosis not present

## 2016-10-11 DIAGNOSIS — Z7901 Long term (current) use of anticoagulants: Secondary | ICD-10-CM | POA: Diagnosis not present

## 2016-10-11 LAB — POCT INR: INR: 2.8

## 2016-10-11 NOTE — Telephone Encounter (Signed)
Rx(s) sent to pharmacy electronically.  

## 2016-10-17 DIAGNOSIS — M6281 Muscle weakness (generalized): Secondary | ICD-10-CM | POA: Diagnosis not present

## 2016-10-17 DIAGNOSIS — H2513 Age-related nuclear cataract, bilateral: Secondary | ICD-10-CM | POA: Diagnosis not present

## 2016-10-17 DIAGNOSIS — R2689 Other abnormalities of gait and mobility: Secondary | ICD-10-CM | POA: Diagnosis not present

## 2016-10-17 DIAGNOSIS — H16223 Keratoconjunctivitis sicca, not specified as Sjogren's, bilateral: Secondary | ICD-10-CM | POA: Diagnosis not present

## 2016-10-17 DIAGNOSIS — H35412 Lattice degeneration of retina, left eye: Secondary | ICD-10-CM | POA: Diagnosis not present

## 2016-10-17 DIAGNOSIS — H40013 Open angle with borderline findings, low risk, bilateral: Secondary | ICD-10-CM | POA: Diagnosis not present

## 2016-10-19 DIAGNOSIS — R2689 Other abnormalities of gait and mobility: Secondary | ICD-10-CM | POA: Diagnosis not present

## 2016-10-19 DIAGNOSIS — M6281 Muscle weakness (generalized): Secondary | ICD-10-CM | POA: Diagnosis not present

## 2016-10-19 DIAGNOSIS — R2681 Unsteadiness on feet: Secondary | ICD-10-CM | POA: Diagnosis not present

## 2016-10-25 ENCOUNTER — Telehealth: Payer: Self-pay | Admitting: Cardiovascular Disease

## 2016-10-25 ENCOUNTER — Other Ambulatory Visit: Payer: Self-pay | Admitting: Cardiovascular Disease

## 2016-10-25 DIAGNOSIS — Z85828 Personal history of other malignant neoplasm of skin: Secondary | ICD-10-CM | POA: Diagnosis not present

## 2016-10-25 DIAGNOSIS — I480 Paroxysmal atrial fibrillation: Secondary | ICD-10-CM | POA: Diagnosis not present

## 2016-10-25 DIAGNOSIS — Z7901 Long term (current) use of anticoagulants: Secondary | ICD-10-CM | POA: Diagnosis not present

## 2016-10-25 DIAGNOSIS — L820 Inflamed seborrheic keratosis: Secondary | ICD-10-CM | POA: Diagnosis not present

## 2016-10-25 LAB — PROTIME-INR: INR: 2.1 — AB (ref 0.9–1.1)

## 2016-10-25 NOTE — Telephone Encounter (Signed)
New message  Pt call requesting to speak with RN about getting INR results. Please call back to discuss

## 2016-10-26 ENCOUNTER — Telehealth: Payer: Self-pay | Admitting: Cardiology

## 2016-10-26 ENCOUNTER — Ambulatory Visit (INDEPENDENT_AMBULATORY_CARE_PROVIDER_SITE_OTHER): Payer: Medicare Other | Admitting: Pharmacist Clinician (PhC)/ Clinical Pharmacy Specialist

## 2016-10-26 DIAGNOSIS — Z7901 Long term (current) use of anticoagulants: Secondary | ICD-10-CM

## 2016-10-26 DIAGNOSIS — I480 Paroxysmal atrial fibrillation: Secondary | ICD-10-CM

## 2016-10-26 NOTE — Telephone Encounter (Signed)
See anticoag note

## 2016-10-26 NOTE — Telephone Encounter (Signed)
Erroneous encounter

## 2016-11-01 DIAGNOSIS — H532 Diplopia: Secondary | ICD-10-CM | POA: Diagnosis not present

## 2016-11-01 DIAGNOSIS — H5021 Vertical strabismus, right eye: Secondary | ICD-10-CM | POA: Diagnosis not present

## 2016-11-10 ENCOUNTER — Other Ambulatory Visit: Payer: Self-pay | Admitting: Cardiovascular Disease

## 2016-11-12 DIAGNOSIS — L84 Corns and callosities: Secondary | ICD-10-CM | POA: Diagnosis not present

## 2016-11-12 DIAGNOSIS — M21611 Bunion of right foot: Secondary | ICD-10-CM | POA: Diagnosis not present

## 2016-11-12 DIAGNOSIS — M21612 Bunion of left foot: Secondary | ICD-10-CM | POA: Diagnosis not present

## 2016-11-12 DIAGNOSIS — M79671 Pain in right foot: Secondary | ICD-10-CM | POA: Diagnosis not present

## 2016-11-14 ENCOUNTER — Ambulatory Visit: Payer: Medicare Other | Admitting: Podiatry

## 2016-11-22 DIAGNOSIS — Z7901 Long term (current) use of anticoagulants: Secondary | ICD-10-CM | POA: Diagnosis not present

## 2016-11-22 DIAGNOSIS — I480 Paroxysmal atrial fibrillation: Secondary | ICD-10-CM | POA: Diagnosis not present

## 2016-11-22 LAB — PROTIME-INR: INR: 1.7 — AB (ref 0.9–1.1)

## 2016-11-23 ENCOUNTER — Ambulatory Visit (INDEPENDENT_AMBULATORY_CARE_PROVIDER_SITE_OTHER): Payer: Medicare Other | Admitting: Pharmacist

## 2016-11-23 DIAGNOSIS — I480 Paroxysmal atrial fibrillation: Secondary | ICD-10-CM

## 2016-11-23 DIAGNOSIS — Z7901 Long term (current) use of anticoagulants: Secondary | ICD-10-CM

## 2016-11-28 ENCOUNTER — Other Ambulatory Visit: Payer: Self-pay | Admitting: Cardiovascular Disease

## 2016-11-28 NOTE — Telephone Encounter (Signed)
Rx(s) sent to pharmacy electronically.  

## 2016-11-30 ENCOUNTER — Ambulatory Visit (INDEPENDENT_AMBULATORY_CARE_PROVIDER_SITE_OTHER): Payer: Medicare Other | Admitting: Cardiovascular Disease

## 2016-11-30 ENCOUNTER — Telehealth: Payer: Self-pay | Admitting: Cardiovascular Disease

## 2016-11-30 ENCOUNTER — Encounter: Payer: Self-pay | Admitting: Cardiovascular Disease

## 2016-11-30 VITALS — BP 110/52 | HR 58 | Ht 62.0 in | Wt 118.0 lb

## 2016-11-30 DIAGNOSIS — E782 Mixed hyperlipidemia: Secondary | ICD-10-CM | POA: Diagnosis not present

## 2016-11-30 DIAGNOSIS — I272 Pulmonary hypertension, unspecified: Secondary | ICD-10-CM | POA: Diagnosis not present

## 2016-11-30 DIAGNOSIS — I10 Essential (primary) hypertension: Secondary | ICD-10-CM

## 2016-11-30 DIAGNOSIS — I480 Paroxysmal atrial fibrillation: Secondary | ICD-10-CM

## 2016-11-30 DIAGNOSIS — I350 Nonrheumatic aortic (valve) stenosis: Secondary | ICD-10-CM | POA: Diagnosis not present

## 2016-11-30 DIAGNOSIS — Z7901 Long term (current) use of anticoagulants: Secondary | ICD-10-CM | POA: Diagnosis not present

## 2016-11-30 DIAGNOSIS — I059 Rheumatic mitral valve disease, unspecified: Secondary | ICD-10-CM | POA: Diagnosis not present

## 2016-11-30 NOTE — Progress Notes (Signed)
Patient ID: [name], female   DOB: 10-Oct-1921, 83 y.o.   MRN: [id number]      HPI: [name] is a 83 y.o. female presents to the office today for a  10 month followup cardiology evaluation and follow-up of her recent hospitalization.  [name] has a history of hypertension, hyperlipidemia, as well as valvular heart disease. An echo Doppler study in December 2013 showed normal systolic function with an ejection fraction of 55-60%. She had mild stenosis of her aortic valve, moderately severe calcified anulus of the mitral valve with moderate MR, moderately severe LA dilatation,  mild-to-moderate RA dilatation, moderate tricuspid regurgitation and moderately severe pulmonary hypertension with PA pressure estimated at 66 mm. Last year, I started her on amlodipine at 2.5 mg.  She felt that she was breathing better since initiating this therapy and  her blood pressure had markedly improved. A followup echo Doppler study on 11/16/2013 showed normal systolic function with an ejection fraction at 60-65%.  Diastolic parameters were normal.  She had mitral annular calcification with moderate mitral regurgitation.  The left atrium was moderately dilated, the right atrium was moderately dilated, and her tricuspid stenosis was now considered severe.  Pulmonary pressures were slightly reduced from previously, but were still elevated at 61 mm.    She was admitted to the hospital on 09/13/2014 and discharged  3 days later.  On the day of admission she awakened from sleep with midsternal all chest discomfort which he initially felt was indigestion.  Her troponin was mildly elevated at 0.23, which increased to 1.15 and there were new inferior T-wave abnormalities.  She was felt to have suffered a non-ST segment elevation MI.  She also developed paroxysmal atrial fibrillation with RVR, which resulted in similar symptomatology as her admission and was felt most likely that this may have been the cause for  her admission.  I performed cardiac catheterization on 09/14/14  Fluoroscopy revealed severe mitral annular calcification and very mild calcification in the region of the aortic valve.  She had hyperdynamic LV function with an ejection fraction of a proximally 70%.  There was severe mitral annular calcification with 3+ MR.  There was mild calcification in the region of the RCA ostium, but otherwise normal-appearing coronary arteries.  Right heart catheterization was performed and her wedge pressure mean was 10.  LV pressure was 152/11.  Pulmonary artery pressure was 33/12.  Cardiac output was 3.3 L/m by the thermodilution method and 3.7 L/m by the Fick method.  When I saw her in April 2016 she was doing well and maintaining sinus rhythm with ventricular rate at 58 bpm. She was recently hospitalized on 05/08/2015 through 05/12/2015 with this and presyncope.  She was found to be bradycardic and had a junctional rhythm.  Her Cardizem, beta blocker therapy was discontinued.  She  Was dehydrated andalso had mild elevation of potassium with renal insufficiency and ACE inhibition was discontinued. She also had a urinary tract infection for which she was treated with Keflex. She was started back on iron therapy for iron deficiency anemia. At discharge, Cardizem low dose was restarted.  After going home from the hospital, on 05/17/2015 her heart rate increased to greater than 100 bpm. Her blood pressure now has been increased on her, reduced medications.  She is on Coumadin therapy  With a chads2vasc score of 4.  When I saw her, her blood pressure was elevated and with her episodes of increased heart rate.  I started her on  carvedilol one half of a 3.125 mg twice a day.  She has tolerated this well and denies any recurrent episodes of fast heartbeat or any episodes of dizziness.  She admits to trace edema above her pressure socks.  An echo Doppler study on 05/09/2015 showed an ejection fraction of 65-70%.  There was grade  1 diastolic dysfunction.  There was mild aortic stenosis with trivial AR, severe mitral annular calcification with moderate MR, moderate left atrial dilatation, moderate TR and increased pulmonary pressures at 74 mm Hg. in December 2016, a carotid duplex study demonstrated heterogeneous plaque bilaterally.  There was 1-39% bilateral ICA stenoses.  She had normal subclavian arteries bilaterally.  She had patent vertebral arteries with antegrade flow  In October 2017 she had a fall and  tripped over a step last week.  She did not go to the emergency room, but saw her primary physician and a CT scan of her head revealed mild injury to her right occipital scalp but no underlying skull fracture or intracranial hemorrhage.  There was an air-fluid level of the maxillary sinus.   Since I last saw her, she has felt well.  She specifically denies any chest pain.  She does note some mild shortness of breath.  She is now walking with a cane.  She goes to a salt water pool twice per week.  She denies any episodes of dizziness.  She denies any further falls.  She is no longer driving.  She is unaware of palpitations, PND, orthopnea.  She presents for reevaluation.  She denies any presyncope or dizziness or visual changes.  She presents for evaluation.   Past Medical History:  Diagnosis Date  . Abnormality of gait 09/07/2015  . Anemia, iron deficiency 09/16/2014  . Anxiety   . CAD (coronary artery disease)    a. 08/2014 NSTEMI/Cath: mild Ca2+ or RCA ostium, otw nl cors. CO 3.3 L/min (thermo), 3.7 L/min (Fick).  . Carotid arterial disease ([place]) 07/30/2012   carotid doppler; normal study  . Chronic anticoagulation, with coumadin, with PAF and CHADS2Vasc2 score of 4 09/16/2014  . Degenerative arthritis   . Diverticulosis   . Essential hypertension   . Foot fracture, left   . GERD (gastroesophageal reflux disease)   . History of nuclear stress test 09/19/2007   normal pattern of perfusion; post-stress EF 86%; EKG  negative for ischemia; low risk scan  . Hyperlipidemia   . Left carotid bruit    a. 07/2015 Carotid U/S: 1-39% bilat ICA stenosis.  . Memory disorder 03/05/2014  . Mild renal insufficiency   . Mitral prolapse   . Neuropathy ([place])   . PAF (paroxysmal atrial fibrillation) ([facility])    a. 08/2014-->Coumadin (CHA2DS2VASc = 4).  . Peripheral neuropathy ([facility])    Small fiber   . Pre-syncope    a. 04/2015 in setting of bradycardia-->CCB/BB doses adjusted.  . Pulmonary hypertension   . Valvular heart disease    a. 04/2015 Echo: EF 65-70%, no rwma, Gr1 DD, mild AS, triv AI, mild MS, mod MR, mod dil LA, mod TR, sev increased PASP.    Past Surgical History:  Procedure Laterality Date  .  Bilateral bunionectomies    . ABDOMINAL HYSTERECTOMY    . APPENDECTOMY    . DILATION AND CURETTAGE OF UTERUS    . FOOT FRACTURE SURGERY Left   . gallbladder resection    . LEFT HEART CATHETERIZATION WITH CORONARY ANGIOGRAM N/A 09/14/2014   Procedure: LEFT HEART CATHETERIZATION WITH CORONARY ANGIOGRAM;  Surgeon:  Troy Sine, MD;  Location: [facility] CATH LAB;  Service: Cardiovascular;  Laterality: N/A;  . REPLACEMENT TOTAL KNEE Right   . resuspension procedure    . TEAR DUCT PROBING WITH STRABISMUS REPAIR     Tear duct repair surgery  . TONSILLECTOMY    . UMBILICAL HERNIA REPAIR      Allergies  Allergen Reactions  . Avelox [Moxifloxacin Hcl In Nacl] Shortness Of Breath and Swelling  . Moxifloxacin Other (See Comments), Shortness Of Breath and Swelling    other  . Aricept [Donepezil Hcl] Diarrhea  . Codeine Swelling       . Donepezil Diarrhea  . Garlic Other (See Comments)    Gi problems  . Latex Hives, Itching, Rash and Other (See Comments)    Watery blisters   . Onion Other (See Comments)    Gi problems  . Sulfa Drugs Cross Reactors Swelling  . Other Other (See Comments)    Her throat closes up  . Duloxetine Rash  . Polysporin [Bacitracin-Polymyxin B] Other (See Comments)    other    Current  Outpatient Prescriptions  Medication Sig Dispense Refill  . acetaminophen (TYLENOL) 500 MG tablet Take 500 mg by mouth.    Marland Kitchen atorvastatin (LIPITOR) 20 MG tablet Take 1 tablet (20 mg total) by mouth at bedtime. 90 tablet 2  . carvedilol (COREG) 3.125 MG tablet Take one-half tablet by mouth twice daily 180 tablet 3  . cetirizine (ZYRTEC) 10 MG tablet Take 10 mg by mouth.    . cholecalciferol (VITAMIN D) 1000 UNITS tablet Take 1,000 Units by mouth daily.      . colestipol (COLESTID) 1 G tablet Take 1 g by mouth 3 (three) times daily. For diarrhea and does not take at the same time as coumadin    . Cyanocobalamin (RA VITAMIN B-12 TR) 1000 MCG TBCR Take by mouth.    . cycloSPORINE (RESTASIS) 0.05 % ophthalmic emulsion Place 1 drop into both eyes daily.      Marland Kitchen diltiazem (CARDIZEM SR) 60 MG 12 hr capsule TAKE ONE CAPSULE BY MOUTH EVERY 12 HOURS 180 capsule 2  . fenofibrate micronized (LOFIBRA) 134 MG capsule TAKE ONE CAPSULE BY MOUTH ONCE DAILY 90 capsule 2  . gabapentin (NEURONTIN) 300 MG capsule TAKE 1 CAPSULE THREE TIMES DAILY AND 3 CAPSULES AT BEDTIME 540 capsule 1  . hydrALAZINE (APRESOLINE) 25 MG tablet Take 1 tablet (25 mg total) by mouth 3 (three) times daily. 270 tablet 3  . hydrochlorothiazide (HYDRODIURIL) 25 MG tablet TAKE 1 TABLET EVERY DAY 90 tablet 0  . ipratropium (ATROVENT) 0.06 % nasal spray Place 1 spray into both nostrils as directed.     . Lactobacillus (ACIDOPHILUS) 100 MG CAPS Take 100 mg by mouth daily.     Marland Kitchen loperamide (IMODIUM) 2 MG capsule Take 1 capsule (2 mg total) by mouth 2 (two) times daily as needed for diarrhea or loose stools. 30 capsule 0  . memantine (NAMENDA) 10 MG tablet Take 1 tablet (10 mg total) by mouth 2 (two) times daily. 90 tablet 3  . Multiple Vitamin (MULTIVITAMIN) tablet Take 1 tablet by mouth daily.      . Multiple Vitamins-Minerals (VISION FORMULA/LUTEIN) TABS Take 1 tablet by mouth daily.    . Omega-3 Fatty Acids (FISH OIL PO) Take by mouth daily.      . pantoprazole (PROTONIX) 40 MG tablet Take 40 mg by mouth daily.     Marland Kitchen pyridOXINE (VITAMIN B-6) 100 MG tablet Take 100 mg by  mouth daily.    . ranitidine (ZANTAC) 150 MG tablet     . simethicone (MYLICON) 035 MG chewable tablet Chew 125 mg by mouth as needed for flatulence.    . warfarin (COUMADIN) 2 MG tablet TAKE 1 AND 1/2 TO 2 TABLETS BY MOUTH DAILY AS DIRECTED BY COUMADIN CLINIC 180 tablet 1   No current facility-administered medications for this visit.     Socially she is widowed. Has 2 children and 2 grandchildren. She does walk. Is no tobacco or alcohol use. [facility] resides [facility]. She remains active.  ROS General: Negative; No fevers, chills, or night sweats;  HEENT: Negative; No changes in vision or hearing, sinus congestion, difficulty swallowing Pulmonary: Negative; No cough, wheezing, hemoptysis Cardiovascular: See history of present illness  GI: Negative; No nausea, vomiting, diarrhea, or abdominal pain GU:  Recent UTI Musculoskeletal: Negative; no myalgias, joint pain, or weakness; she is now walking with a cane Hematologic/Oncology: Negative; no easy bruising, bleeding Endocrine: Negative; no heat/cold intolerance; no diabetes Neuro: Negative; no changes in balance, headaches Skin: Negative; No rashes or skin lesions Psychiatric: Negative; No behavioral problems, depression Sleep: Negative; No snoring, daytime sleepiness, hypersomnolence, bruxism, restless legs, hypnogognic hallucinations, no cataplexy Other comprehensive 14 point system review is negative.   PE BP (!) 110/52   Pulse (!) 58   Ht 5' 2" (1.575 m)   Wt 118 lb (53.5 kg)   BMI 21.58 kg/m    Repeat blood pressure by me was 138/80  Wt Readings from Last 3 Encounters:  11/30/16 118 lb (53.5 kg)  09/19/16 118 lb (53.5 kg)  09/12/16 121 lb (54.9 kg)   General: Alert, oriented, no distress.  Skin: normal turgor, no rashes HEENT: Normocephalic, atraumatic. Pupils round and reactive; sclera  anicteric;no lid lag.  Nose without nasal septal hypertrophy Mouth/Parynx benign; Mallinpatti scale 2  Neck: No JVD, Bilateral carotid bruit versus transmitted aortic murmur; normal carotid upstroke Chest wall: No tenderness to palpation Lungs: clear to ausculatation and percussion; no wheezing or rales Heart: RRR, s1 s2 normal; 2/6 systolic murmur in aortic region ;2/6 murmur at the apex radiating to the axilla.  No S3 gallop.  No rubs, thrills or heaves.  No hepatic jugular reflux. Abdomen: soft, nontender; no hepatosplenomehaly, BS+; abdominal aorta nontender and not dilated by palpation. Back: No CVA tenderness  Pulses 2+ Extremities: No ankle edema.  Trivial edema above the compression sock line no clubbing cyanosis , Homan's sign negative  Neurologic: grossly nonfocal Psyhological: Normal affect and mood   ECG (independently read by me): Sinus bradycardia 58 bpm.  PH by voltage.  No significant ST-T changes.  October 2017 ECG (independently read by me): Normal sinus rhythm at 66 bpm.  No ectopy.  Normal intervals.  Borderline LVH by voltage in aVL.  December 2016 ECG (independently read by me): Normal sinus rhythm at 78 bpm.  Normal intervals.  ECG (independently read by me):  Sinus rhythm with sinus arrhythmia at 80 bpm.  Normal intervals.  No ST segment changes.  April 2016ECG (independently read by me): Sinus bradycardia 58 bpm.  Left axis deviation.  LVH by voltage criteria in aVL.  No significant ST segment changes.  October 2015 ECG (independently read by me): Sinus bradycardia 48 beats per minute.  No ectopy.  PR interval 164 ms  Prior April 2015 ECG (independently by me) sinus bradycardia 50 beats per minute.  No ectopy.  Normal intervals.  No ST segment changes.  Prior 09/02/2013 ECG (independently read  by me ): Sinus bradycardia at 51 beats per minute. Left axis deviation. No significant ST change. PR interval 160 ms. QTc interval 418 ms.   LABS:  BMP Latest Ref Rng &  Units 11/20/2015 05/20/2015 05/12/2015  Glucose 65 - 99 mg/dL 89 88 73  BUN 6 - 20 mg/dL _0 Creatinine 0.44 - 1.00 mg/dL 0.58 0.76 0.65  Sodium 135 - 145 mmol/L 141 139 135  Potassium 3.5 - 5.1 mmol/L 3.6 4.8 3.1(L)  Chloride 101 - 111 mmol/L 106 100 103  CO2 22 - 32 mmol/L _1 Calcium 8.9 - 10.3 mg/dL 9.7 10.2 9.1    Hepatic Function Latest Ref Rng & Units 11/20/2015 05/20/2015 05/08/2015  Total Protein 6.5 - 8.1 g/dL 7.8 7.4 6.1(L)  Albumin 3.5 - 5.0 g/dL 4.2 4.4 3.4(L)  AST 15 - 41 U/L 32 28 30  ALT 14 - 54 U/L _2 Alk Phosphatase 38 - 126 U/L 40 42 27(L)  Total Bilirubin 0.3 - 1.2 mg/dL 0.2(L) 0.4 0.4    CBC Latest Ref Rng & Units 11/20/2015 05/12/2015 05/10/2015  WBC 4.0 - 10.5 K/uL 4.1 5.6 6.8  Hemoglobin 12.0 - 15.0 g/dL 11.9(L) 10.4(L) 11.2(L)  Hematocrit 36.0 - 46.0 % 34.7(L) 30.5(L) 33.3(L)  Platelets 150 - 400 K/uL 198 225 230   Lab Results  Component Value Date   MCV 83.4 11/20/2015   MCV 86.4 05/12/2015   MCV 88.8 05/10/2015   Lab Results  Component Value Date   TSH 0.154 (L) 03/05/2014   Lab Results  Component Value Date   HGBA1C 5.6 09/13/2014    BNP No results found for: PROBNP   Lipid Panel     Component Value Date/Time   CHOL 136 09/14/2014 0501   TRIG 119 09/14/2014 0501   HDL 50 09/14/2014 0501   CHOLHDL 2.7 09/14/2014 0501   VLDL 24 09/14/2014 0501   LDLCALC 62 09/14/2014 0501     RADIOLOGY: No results found.  IMPRESSION:  1. Mild aortic stenosis   2. Moderate to severe pulmonary hypertension   3. Essential hypertension   4. PAF (paroxysmal atrial fibrillation) ([place])   5. Mixed hyperlipidemia   6. Mitral valve disease   7. Long-term (current) use of anticoagulants     ASSESSMENT AND PLAN: Ms. Doyel is a 83 years old Caucasian female who has a history of hypertension, hyperlipidemia, significant mitral annular calcification with MR.  During her hospitalization in 2016 she was noted to have paroxysmal atrial  fibrillation with rapid ventricular response which may have been the inciting cause of her chest pain which awakened her from sleep.   An echo Doppler study in September 2016 showed an ejection fraction at 65-70%.  There was mild LVH.  Grade 1 diastolic dysfunction.  There was evidence for mild aortic valve stenosis with trivial AR, severely calcified mitral annulus with mild stenosis and moderate regurgitation.  She had moderate LA dilatation and moderate TR. and at that time showed significant pulmonary pressure elevation at 74 mm Hg.  Cardiac catheterization did not reveal significant coronary obstructive disease.  Following diuresis she did not have significant pulmonary hypertension and her PA pressure on right heart catheterization was only 33 mm systolically.  Presently, her blood pressure is controlled and she is without orthostatic drop on a regimen consisting of carvedilol one half of a 3.125 pill twice a day, diltiazem 60 mg every 12 hours, hydralazine 25 mg 3 times a day, and HCTZ 25  mg.  She continues to be on warfarin for anticoagulation.  She is on atorvastatin 20 mg for hyperlipidemia.  She is on Protonix for GERD.  She is still fairly independent and has been advised to no longer drive by her son.  I am recommending a follow-up echo Doppler study be done to reassess her systolic and diastolic function, pulmonary hypertension, and valvular heart disease.  She will undergo follow-up fasting laboratory.  Adjustments to her medical regimen will be made if necessary.  She continues to go to the pool 2 days per week for water exercises.  I will see her in 6 months for reevaluation or sooner if problem arise.   Time spent: 25 minutes  Troy Sine, MD, [facility]  11/30/2016 2:28 PM

## 2016-11-30 NOTE — Telephone Encounter (Signed)
Patient calling states that she was told she could have her laps completed at any lab but states that she has document that "looks like he wants me to go to [facility]." Please call to discuss, thanks.

## 2016-11-30 NOTE — Patient Instructions (Signed)
Medication Instructions:   No change  Labwork:  Labs ordered. Slips provided to you today.  Testing/Procedures:  Your physician has requested that you have an echocardiogram. Echocardiography is a painless test that uses sound waves to create images of your heart. It provides your doctor with information about the size and shape of your heart and how well your heart's chambers and valves are working. This procedure takes approximately one hour. There are no restrictions for this procedure.  You will receive your test results via mail, phone call, or by My chart.   Follow-Up:  Your physician wants you to follow-up in: 6 months or sooner if needed. You will receive a reminder letter in the mail two months in advance. If you don't receive a letter, please call our office to schedule the follow-up appointment.   Any Other Special Instructions Will Be Listed Below (If Applicable).

## 2016-11-30 NOTE — Telephone Encounter (Signed)
LM for patient on name-verified VM that she can use any [facility] lab but if she prefers [facility], she can call our office and request the lab order to be changed. Can also go to PCP just make sure results are sent to Dr. Claiborne Billings

## 2016-12-10 LAB — CBC
HCT: 35.2 % (ref 35.0–45.0)
Hemoglobin: 11.5 g/dL — ABNORMAL LOW (ref 11.7–15.5)
MCH: 28.6 pg (ref 27.0–33.0)
MCHC: 32.7 g/dL (ref 32.0–36.0)
MCV: 87.6 fL (ref 80.0–100.0)
MPV: 10.4 fL (ref 7.5–12.5)
Platelets: 278 10*3/uL (ref 140–400)
RBC: 4.02 MIL/uL (ref 3.80–5.10)
RDW: 14 % (ref 11.0–15.0)
WBC: 5.4 10*3/uL (ref 3.8–10.8)

## 2016-12-10 LAB — TSH: TSH: 1.95 mIU/L

## 2016-12-11 ENCOUNTER — Telehealth: Payer: Self-pay | Admitting: Cardiovascular Disease

## 2016-12-11 LAB — LIPID PANEL
Cholesterol: 111 mg/dL (ref <200)
HDL: 66 mg/dL (ref >50)
LDL Cholesterol: 31 mg/dL (ref <100)
Total CHOL/HDL Ratio: 1.7 Ratio (ref <5.0)
Triglycerides: 71 mg/dL (ref <150)
VLDL: 14 mg/dL (ref <30)

## 2016-12-11 LAB — COMPREHENSIVE METABOLIC PANEL
ALT: 13 U/L (ref 6–29)
AST: 23 U/L (ref 10–35)
Albumin: 4.2 g/dL (ref 3.6–5.1)
Alkaline Phosphatase: 43 U/L (ref 33–130)
BUN: 28 mg/dL — ABNORMAL HIGH (ref 7–25)
CO2: 28 mmol/L (ref 20–31)
Calcium: 10 mg/dL (ref 8.6–10.4)
Chloride: 103 mmol/L (ref 98–110)
Creat: 0.79 mg/dL (ref 0.60–0.88)
Glucose, Bld: 87 mg/dL (ref 65–99)
Potassium: 4 mmol/L (ref 3.5–5.3)
Sodium: 140 mmol/L (ref 135–146)
Total Bilirubin: 0.3 mg/dL (ref 0.2–1.2)
Total Protein: 7.4 g/dL (ref 6.1–8.1)

## 2016-12-11 MED ORDER — DILTIAZEM HCL ER 60 MG PO CP12: 60.0000 mg | ORAL_CAPSULE | Freq: Two times a day (BID) | ORAL | 1 refills | Status: DC

## 2016-12-11 NOTE — Telephone Encounter (Signed)
Rx for Diltiazem sent to [facility] month supply.

## 2016-12-13 DIAGNOSIS — M21611 Bunion of right foot: Secondary | ICD-10-CM | POA: Diagnosis not present

## 2016-12-13 DIAGNOSIS — M79671 Pain in right foot: Secondary | ICD-10-CM | POA: Diagnosis not present

## 2016-12-13 DIAGNOSIS — M21612 Bunion of left foot: Secondary | ICD-10-CM | POA: Diagnosis not present

## 2016-12-13 DIAGNOSIS — L84 Corns and callosities: Secondary | ICD-10-CM | POA: Diagnosis not present

## 2016-12-19 ENCOUNTER — Encounter (INDEPENDENT_AMBULATORY_CARE_PROVIDER_SITE_OTHER): Payer: Self-pay

## 2016-12-19 ENCOUNTER — Other Ambulatory Visit: Payer: Self-pay

## 2016-12-19 ENCOUNTER — Ambulatory Visit (HOSPITAL_COMMUNITY): Payer: Medicare Other | Attending: Cardiology

## 2016-12-19 DIAGNOSIS — I700 Atherosclerosis of aorta: Secondary | ICD-10-CM | POA: Insufficient documentation

## 2016-12-19 DIAGNOSIS — I350 Nonrheumatic aortic (valve) stenosis: Secondary | ICD-10-CM | POA: Insufficient documentation

## 2016-12-19 LAB — ECHOCARDIOGRAM COMPLETE
AO mean calculated velocity dopler: 157 cm/s
AV Area VTI index: 0.74 cm2/m2
AV Area VTI: 0.91 cm2
AV Area mean vel: 0.98 cm2
AV Mean grad: 11 mmHg
AV Peak grad: 24 mmHg
AV VEL mean LVOT/AV: 0.43
AV area mean vel ind: 0.64 cm2/m2
AV peak Index: 0.59
AV pk vel: 245 cm/s
AV vel: 1.13
Ao pk vel: 0.4 m/s
E decel time: 190 msec
E/e' ratio: 14.51
FS: 43 % (ref 28–44)
IVS/LV PW RATIO, ED: 1.3
LA ID, A-P, ES: 47 mm
LA diam end sys: 47 mm
LA diam index: 3.07 cm/m2
LA vol A4C: 93 ml
LA vol index: 62.7 mL/m2
LA vol: 96 mL
LV E/e' medial: 14.51
LV E/e'average: 14.51
LV PW d: 10.1 mm — AB (ref 0.6–1.1)
LV e' LATERAL: 7.79 cm/s
LVOT SV: 64 mL
LVOT VTI: 28.4 cm
LVOT area: 2.27 cm2
LVOT diameter: 17 mm
LVOT peak VTI: 0.5 cm
LVOT peak grad rest: 4 mmHg
LVOT peak vel: 98.5 cm/s
Lateral S' vel: 13.2 cm/s
MV Dec: 190
MV Peak grad: 5 mmHg
MV pk A vel: 90.8 m/s
MV pk E vel: 113 m/s
PISA EROA: 0.18 cm2
RV sys press: 64 mmHg
Reg peak vel: 373 cm/s
TDI e' lateral: 7.79
TDI e' medial: 5.59
TR max vel: 373 cm/s
VTI: 230 cm
VTI: 56.9 cm
Valve area index: 0.74
Valve area: 1.13 cm2

## 2016-12-20 MED ORDER — MEMANTINE HCL 10 MG PO TABS: 10.0000 mg | ORAL_TABLET | Freq: Two times a day (BID) | ORAL | 3 refills | Status: DC

## 2016-12-20 MED ORDER — MEMANTINE HCL 10 MG PO TABS: 10.0000 mg | ORAL_TABLET | Freq: Two times a day (BID) | ORAL | 0 refills | Status: DC

## 2016-12-20 NOTE — Telephone Encounter (Signed)
Pt calling re: memantine (NAMENDA) 10 MG tablet  she has been told it will take 7-10 days to get the medication and she only has 1.  Pt would like to know if a prescription could be called in or there are any samples, please call

## 2016-12-20 NOTE — Telephone Encounter (Signed)
E-scribed refills to patient's pharmacy as requested.

## 2016-12-20 NOTE — Telephone Encounter (Signed)
Called and spoke with patient. She ran out of medication on 12/18/16. She would like 30 day supply sent to  [place], [place] [phone] (Phone) [phone] (Fax)   Any further refills she would like to go to [facility] delivery pharmacy.

## 2016-12-20 NOTE — Addendum Note (Signed)
Addended by: Hope Pigeon on: 12/20/2016 03:49 PM   Modules accepted: Orders

## 2016-12-26 ENCOUNTER — Telehealth: Payer: Self-pay | Admitting: Cardiovascular Disease

## 2016-12-26 NOTE — Telephone Encounter (Signed)
Pt called for the results of her Echo. 12/19/16  Please give her a call back.

## 2016-12-26 NOTE — Telephone Encounter (Signed)
Returned the call to the patient. The call was disconnected after no answer.

## 2016-12-27 DIAGNOSIS — Z7901 Long term (current) use of anticoagulants: Secondary | ICD-10-CM | POA: Diagnosis not present

## 2016-12-27 DIAGNOSIS — I5020 Unspecified systolic (congestive) heart failure: Secondary | ICD-10-CM | POA: Diagnosis not present

## 2016-12-27 LAB — POCT INR: INR: 2.3

## 2016-12-28 ENCOUNTER — Telehealth: Payer: Self-pay | Admitting: Cardiovascular Disease

## 2016-12-28 ENCOUNTER — Ambulatory Visit (INDEPENDENT_AMBULATORY_CARE_PROVIDER_SITE_OTHER): Payer: Self-pay | Admitting: Pharmacist Clinician (PhC)/ Clinical Pharmacy Specialist

## 2016-12-28 DIAGNOSIS — I480 Paroxysmal atrial fibrillation: Secondary | ICD-10-CM

## 2016-12-28 MED ORDER — WARFARIN SODIUM 2 MG PO TABS: ORAL_TABLET | ORAL | 3 refills | Status: DC

## 2016-12-28 NOTE — Telephone Encounter (Signed)
New message     *STAT* If patient is at the pharmacy, call can be transferred to refill team.   1. Which medications need to be refilled? (please list name of each medication and dose if known) warfarin (COUMADIN) 2 MG tablet  2. Which pharmacy/location (including street and city if local pharmacy) is medication to be sent to? [facility]  3. Do they need a 30 day or 90 day supply? 30 day supply   Pt is out

## 2016-12-28 NOTE — Telephone Encounter (Signed)
No Kelly result yet, Lm2cb

## 2016-12-31 NOTE — Telephone Encounter (Signed)
Pt.notified

## 2016-12-31 NOTE — Telephone Encounter (Signed)
Notes recorded by Troy Sine, MD on 12/30/2016 at 10:20 AM EDT Normal LV function with grade 2 diastolic dysfunction, mild aortic stenosis with trace AR, severe mitral annular dilatation with severe MR and LA enlargement, mild-to-moderate TR with significant PA pressure elevation at 64 mm. The patient is 83 years old and will continue with medical therapy

## 2017-01-01 ENCOUNTER — Encounter: Payer: Self-pay | Admitting: *Deleted

## 2017-01-24 DIAGNOSIS — I5022 Chronic systolic (congestive) heart failure: Secondary | ICD-10-CM | POA: Diagnosis not present

## 2017-01-24 DIAGNOSIS — Z7901 Long term (current) use of anticoagulants: Secondary | ICD-10-CM | POA: Diagnosis not present

## 2017-01-24 LAB — PROTIME-INR: INR: 2.2 — AB (ref 0.9–1.1)

## 2017-01-25 ENCOUNTER — Ambulatory Visit (INDEPENDENT_AMBULATORY_CARE_PROVIDER_SITE_OTHER): Payer: Medicare Other | Admitting: Pharmacist

## 2017-01-25 DIAGNOSIS — N839 Noninflammatory disorder of ovary, fallopian tube and broad ligament, unspecified: Secondary | ICD-10-CM | POA: Diagnosis not present

## 2017-01-25 DIAGNOSIS — I480 Paroxysmal atrial fibrillation: Secondary | ICD-10-CM

## 2017-01-25 DIAGNOSIS — B353 Tinea pedis: Secondary | ICD-10-CM | POA: Diagnosis not present

## 2017-01-28 ENCOUNTER — Telehealth: Payer: Self-pay | Admitting: *Deleted

## 2017-01-28 NOTE — Telephone Encounter (Signed)
Patient called and scheduled a follow up appt due to pelvic pain. Appt scheduled for June 15th at 1:30pm; arrive at 1:15pm. Patient aware

## 2017-01-30 ENCOUNTER — Other Ambulatory Visit: Payer: Self-pay | Admitting: Cardiovascular Disease

## 2017-01-31 ENCOUNTER — Other Ambulatory Visit: Payer: Self-pay | Admitting: Cardiovascular Disease

## 2017-02-01 ENCOUNTER — Telehealth: Payer: Self-pay | Admitting: Cardiovascular Disease

## 2017-02-01 ENCOUNTER — Encounter: Payer: Self-pay | Admitting: Gynecology

## 2017-02-01 ENCOUNTER — Ambulatory Visit: Payer: Medicare Other | Attending: Gynecology | Admitting: Gynecology

## 2017-02-01 VITALS — BP 130/55 | HR 65 | Temp 97.5°F | Resp 20 | Wt 118.0 lb

## 2017-02-01 DIAGNOSIS — Z87891 Personal history of nicotine dependence: Secondary | ICD-10-CM | POA: Diagnosis not present

## 2017-02-01 DIAGNOSIS — R1032 Left lower quadrant pain: Secondary | ICD-10-CM

## 2017-02-01 DIAGNOSIS — N83209 Unspecified ovarian cyst, unspecified side: Secondary | ICD-10-CM

## 2017-02-01 DIAGNOSIS — I2510 Atherosclerotic heart disease of native coronary artery without angina pectoris: Secondary | ICD-10-CM | POA: Insufficient documentation

## 2017-02-01 DIAGNOSIS — Z90710 Acquired absence of both cervix and uterus: Secondary | ICD-10-CM | POA: Insufficient documentation

## 2017-02-01 DIAGNOSIS — Z79899 Other long term (current) drug therapy: Secondary | ICD-10-CM | POA: Diagnosis not present

## 2017-02-01 DIAGNOSIS — R102 Pelvic and perineal pain: Secondary | ICD-10-CM | POA: Insufficient documentation

## 2017-02-01 DIAGNOSIS — K219 Gastro-esophageal reflux disease without esophagitis: Secondary | ICD-10-CM | POA: Diagnosis not present

## 2017-02-01 DIAGNOSIS — Z809 Family history of malignant neoplasm, unspecified: Secondary | ICD-10-CM | POA: Diagnosis not present

## 2017-02-01 DIAGNOSIS — Z91040 Latex allergy status: Secondary | ICD-10-CM | POA: Insufficient documentation

## 2017-02-01 DIAGNOSIS — Z888 Allergy status to other drugs, medicaments and biological substances status: Secondary | ICD-10-CM | POA: Insufficient documentation

## 2017-02-01 DIAGNOSIS — Z8249 Family history of ischemic heart disease and other diseases of the circulatory system: Secondary | ICD-10-CM | POA: Insufficient documentation

## 2017-02-01 DIAGNOSIS — Z9889 Other specified postprocedural states: Secondary | ICD-10-CM | POA: Insufficient documentation

## 2017-02-01 DIAGNOSIS — N83202 Unspecified ovarian cyst, left side: Secondary | ICD-10-CM | POA: Insufficient documentation

## 2017-02-01 NOTE — Telephone Encounter (Signed)
New message       Pt c/o medication issue:  1. Name of Medication:  carvedilol 2. How are you currently taking this medication (dosage and times per day)? 3.125 mg bid 3. Are you having a reaction (difficulty breathing--STAT)?    4. What is your medication issue?  Pt states after taking this medication, she does not feel "steady" on her feet.  Please call before 12:30

## 2017-02-01 NOTE — Patient Instructions (Signed)
Plan to have an ultrasound to evaluate the left ovarian cyst and pain.  We will also reach out to your cardiologist to see about obtaining cardiac clearance.  After clearance is obtained, we will arrange for you to meet with Dr. Everitt Amber in the office to discuss the possibility of surgery.  Please call if your pain worsens.  Arrive to your ultrasound with a full bladder (drink 32 ounces of fluid 1 hour before your ultrasound and do not void)

## 2017-02-01 NOTE — Telephone Encounter (Signed)
S/w pt she states that today is the first day that she has increased her medication to 1/2 tab BID she states that she willo continue and see how it goes over the weekend. She states that she will cb Monday/ over the weekend for update

## 2017-02-01 NOTE — Progress Notes (Signed)
Consult Note: Gyn-Onc   [name] 83 y.o. female  Chief Complaint  Patient presents with  . Pelvic Pain  . Ovarian Cyst    Assessment New-onset of intermittent left lower quadrant pain possibly representing intermittent ovarian torsion.  :Left ovarian cyst documented on serial imaging since December 2014. Normal CA-125 values. .  Plan: I met with the patient and her son to review her current situation. Given that the pain score is 5 out of 10 and represents approximately 30% of her day I would recommend the ovarian cyst be removed. Hopefully we can do this laparoscopically or robotically. Prior to surgery would like to have further consultation with her cardiologist regarding any other management necessary to optimize surgical outcome. Management of her Coumadin needs to be addressed as well. In the interim we'll also obtain an ultrasound to reassess the ovarian cyst. Patient understands that Dr. Everitt Cook will be her primary surgeon. Further the patient and her son are aware that should the surgical procedure not be possible laparoscopically (possibly secondary to adhesions) that a laparotomy would be required.  Interval history: The patient presents today with a two-week history of left lower quadrant pain. The pain is intermittent occurring approximately 30% of the time. She grades the pain as 5 out of 10. It is not awaken her from sleep at night. She has no other GI GU or pelvic symptoms.  HPI: 83 year old white widowed female para 1 seen in consultation request of Dr. [name] regarding management of a left ovarian cyst. Review the patient's records reveals that she has had serial imaging since December 2014. At that time she was found to have a simple cyst measuring 3.7 x 3.7 x 3.0 cm. In November 2016 and ultrasound showed the cyst measuring 4.6 x 3.6 x 4.5 cm. There is no nodularity or free fluid. MRI on 08/04/2015 reaffirmed the ultrasound findings. On 08/06/2016 the patient  had another ultrasound showing slight increased size and the cyst now measuring 5.2 x 4.0 x 4.6 cm. There was a 1.4 cm nodule. Recent CA-125 was 9 units per mL and a year prior was 11 units per mL.  The patient is entirely asymptomatic. She denies any pelvic pain or pressure or any other GI or GU symptoms.  She has a past history of having an abdominal hysterectomy and uterine suspension.  Review of Systems:10 point review of systems is negative except as noted in interval history.   Vitals: Blood pressure (!) 130/55, pulse 65, temperature 97.5 F (36.4 C), temperature source Oral, resp. rate 20, weight 118 lb (53.5 kg), SpO2 99 %.  Physical Exam: General : The patient is a healthy woman in no acute distress.  HEENT: normocephalic, extraoccular movements normal; neck is supple without thyromegally  Lynphnodes: Supraclavicular and inguinal nodes not enlarged  Abdomen: Soft, non-tender, no ascites, no organomegally, no masses, no hernias , she has a midline incision as well as a subcostal incision and a incision at McBurney's point.  Pelvic exam:  EGBUS normal  Vagina is atrophic. Cervix and uterus surgically absent  Bimanual exam reveals some fullness in the left adnexa with some slight tenderness. There is no rebound.   Lower extremities: No edema or varicosities. Normal range of motion      Allergies  Allergen Reactions  . Avelox [Moxifloxacin Hcl In Nacl] Shortness Of Breath and Swelling  . Moxifloxacin Other (See Comments), Shortness Of Breath and Swelling    other  . Aricept [Donepezil Hcl] Diarrhea  .  Codeine Swelling       . Donepezil Diarrhea  . Garlic Other (See Comments)    Gi problems  . Latex Hives, Itching, Rash and Other (See Comments)    Watery blisters   . Onion Other (See Comments)    Gi problems  . Sulfa Drugs Cross Reactors Swelling  . Other Other (See Comments)    Her throat closes up  . Duloxetine Rash  . Polysporin [Bacitracin-Polymyxin B] Other  (See Comments)    other    Past Medical History:  Diagnosis Date  . Abnormality of gait 09/07/2015  . Anemia, iron deficiency 09/16/2014  . Anxiety   . CAD (coronary artery disease)    a. 08/2014 NSTEMI/Cath: mild Ca2+ or RCA ostium, otw nl cors. CO 3.3 L/min (thermo), 3.7 L/min (Fick).  . Carotid arterial disease ([place]) 07/30/2012   carotid doppler; normal study  . Chronic anticoagulation, with coumadin, with PAF and CHADS2Vasc2 score of 4 09/16/2014  . Degenerative arthritis   . Diverticulosis   . Essential hypertension   . Foot fracture, left   . GERD (gastroesophageal reflux disease)   . History of nuclear stress test 09/19/2007   normal pattern of perfusion; post-stress EF 86%; EKG negative for ischemia; low risk scan  . Hyperlipidemia   . Left carotid bruit    a. 07/2015 Carotid U/S: 1-39% bilat ICA stenosis.  . Memory disorder 03/05/2014  . Mild renal insufficiency   . Mitral prolapse   . Neuropathy   . PAF (paroxysmal atrial fibrillation) ([facility])    a. 08/2014-->Coumadin (CHA2DS2VASc = 4).  . Peripheral neuropathy    Small fiber   . Pre-syncope    a. 04/2015 in setting of bradycardia-->CCB/BB doses adjusted.  . Pulmonary hypertension ([place])   . Valvular heart disease    a. 04/2015 Echo: EF 65-70%, no rwma, Gr1 DD, mild AS, triv AI, mild MS, mod MR, mod dil LA, mod TR, sev increased PASP.    Past Surgical History:  Procedure Laterality Date  .  Bilateral bunionectomies    . ABDOMINAL HYSTERECTOMY    . APPENDECTOMY    . DILATION AND CURETTAGE OF UTERUS    . FOOT FRACTURE SURGERY Left   . gallbladder resection    . LEFT HEART CATHETERIZATION WITH CORONARY ANGIOGRAM N/A 09/14/2014   Procedure: LEFT HEART CATHETERIZATION WITH CORONARY ANGIOGRAM;  Surgeon: Troy Sine, MD;  Location: [facility] CATH LAB;  Service: Cardiovascular;  Laterality: N/A;  . REPLACEMENT TOTAL KNEE Right   . resuspension procedure    . TEAR DUCT PROBING WITH STRABISMUS REPAIR     Tear duct repair surgery   . TONSILLECTOMY    . UMBILICAL HERNIA REPAIR      Current Outpatient Prescriptions  Medication Sig Dispense Refill  . acetaminophen (TYLENOL) 500 MG tablet Take 500 mg by mouth.    Marland Kitchen atorvastatin (LIPITOR) 20 MG tablet Take 1 tablet (20 mg total) by mouth at bedtime. 90 tablet 2  . carvedilol (COREG) 3.125 MG tablet TAKE 1 TABLET BY MOUTH TWICE A DAY 180 tablet 0  . cetirizine (ZYRTEC) 10 MG tablet Take 10 mg by mouth.    . cholecalciferol (VITAMIN D) 1000 UNITS tablet Take 1,000 Units by mouth daily.      . colestipol (COLESTID) 1 G tablet Take 1 g by mouth 3 (three) times daily. For diarrhea and does not take at the same time as coumadin    . Cyanocobalamin (RA VITAMIN B-12 TR) 1000 MCG TBCR Take by mouth.    Marland Kitchen  cycloSPORINE (RESTASIS) 0.05 % ophthalmic emulsion Place 1 drop into both eyes daily.      Marland Kitchen diltiazem (CARDIZEM SR) 60 MG 12 hr capsule Take 1 capsule (60 mg total) by mouth every 12 (twelve) hours. 180 capsule 1  . fenofibrate micronized (LOFIBRA) 134 MG capsule TAKE ONE CAPSULE BY MOUTH ONCE DAILY 90 capsule 2  . gabapentin (NEURONTIN) 300 MG capsule TAKE 1 CAPSULE THREE TIMES DAILY AND 3 CAPSULES AT BEDTIME 540 capsule 1  . hydrALAZINE (APRESOLINE) 25 MG tablet Take 1 tablet (25 mg total) by mouth 3 (three) times daily. 270 tablet 3  . hydrochlorothiazide (HYDRODIURIL) 25 MG tablet TAKE 1 TABLET EVERY DAY 90 tablet 3  . ipratropium (ATROVENT) 0.06 % nasal spray Place 1 spray into both nostrils as directed.     . Lactobacillus (ACIDOPHILUS) 100 MG CAPS Take 100 mg by mouth daily.     Marland Kitchen loperamide (IMODIUM) 2 MG capsule Take 1 capsule (2 mg total) by mouth 2 (two) times daily as needed for diarrhea or loose stools. 30 capsule 0  . memantine (NAMENDA) 10 MG tablet Take 1 tablet (10 mg total) by mouth 2 (two) times daily. 60 tablet 0  . memantine (NAMENDA) 10 MG tablet Take 1 tablet (10 mg total) by mouth 2 (two) times daily. 180 tablet 3  . Multiple Vitamin (MULTIVITAMIN) tablet  Take 1 tablet by mouth daily.      . Multiple Vitamins-Minerals (VISION FORMULA/LUTEIN) TABS Take 1 tablet by mouth daily.    . Omega-3 Fatty Acids (FISH OIL PO) Take by mouth daily.    . pantoprazole (PROTONIX) 40 MG tablet Take 40 mg by mouth daily.     Marland Kitchen pyridOXINE (VITAMIN B-6) 100 MG tablet Take 100 mg by mouth daily.    . ranitidine (ZANTAC) 150 MG tablet     . simethicone (MYLICON) 671 MG chewable tablet Chew 125 mg by mouth as needed for flatulence.    . warfarin (COUMADIN) 2 MG tablet Take 1.5 to 2 tablets by mouth daily as directed by coumadin clinic 60 tablet 3   No current facility-administered medications for this visit.     Social History   Social History  . Marital status: Widowed    Spouse name: N/A  . Number of children: 2  . Years of education: AS   Occupational History  . Retired    Social History Main Topics  . Smoking status: Former Research scientist (life sciences)  . Smokeless tobacco: Never Used  . Alcohol use No  . Drug use: No  . Sexual activity: No   Other Topics Concern  . Not on file   Social History Narrative   Resides at [facility]   Patient is left handed, but uses right handed.   Patient drinks one cup caffeine daily.       Family History  Problem Relation Age of Onset  . Pneumonia Mother   . Coronary artery disease Mother   . Hypertension Mother   . Heart disease Father   . Cancer Father   . Hypertension Father   . Arthritis Sister       Marti Sleigh, MD 02/01/2017, 1:32 PM

## 2017-02-04 ENCOUNTER — Ambulatory Visit (HOSPITAL_COMMUNITY)
Admission: RE | Admit: 2017-02-04 | Discharge: 2017-02-04 | Disposition: A | Discharge status: Home / Self Care | Payer: Medicare Other | Source: Ambulatory Visit | Attending: Gynecologic Oncology | Admitting: Gynecologic Oncology

## 2017-02-04 DIAGNOSIS — N83202 Unspecified ovarian cyst, left side: Secondary | ICD-10-CM | POA: Insufficient documentation

## 2017-02-04 DIAGNOSIS — N83209 Unspecified ovarian cyst, unspecified side: Secondary | ICD-10-CM

## 2017-02-04 DIAGNOSIS — R102 Pelvic and perineal pain: Secondary | ICD-10-CM | POA: Insufficient documentation

## 2017-02-05 ENCOUNTER — Telehealth: Payer: Self-pay | Admitting: Gynecologic Oncology

## 2017-02-05 ENCOUNTER — Telehealth: Payer: Self-pay | Admitting: Cardiovascular Disease

## 2017-02-05 NOTE — Telephone Encounter (Signed)
Reached out to Dr. Evette Georges office to inquire about patient needing cardiac clearance and recommendations for Coumadin use around surgery.  Message was left for Dr. Evette Georges nurse.

## 2017-02-05 NOTE — Telephone Encounter (Signed)
New Message        [facility] Pre-operative Risk Assessment    Request for surgical clearance:  1. What type of surgery is being performed?  BSO  Robotic assist   2. When is this surgery scheduled? Not scheduled yet   3. Are there any medications that need to be held prior to surgery and how long? Needs recommendation for coumadin and is she clear to have surgery   4. Name of physician performing surgery?  Dr Everitt Amber  5. What is your office phone and fax number?  [phone] fax [phone]   Howie Ill 02/05/2017, 9:39 AM  _________________________________________________________________   (provider comments below)

## 2017-02-05 NOTE — Telephone Encounter (Signed)
Patient informed of [place] results and advised that we are waiting to hear from Dr. Evette Georges office before proceeding with scheduling her pre-op appt and surgery.  No concerns voiced.  Advised we will contact her once we have heard from her cardiologist.  She states she had an episode of pain this am but it resolved after three minutes.

## 2017-02-06 ENCOUNTER — Telehealth: Payer: Self-pay | Admitting: *Deleted

## 2017-02-06 NOTE — Telephone Encounter (Signed)
F/U call:  Patient calling for an update in regards to previous message, thanks.

## 2017-02-06 NOTE — Telephone Encounter (Signed)
Patient called and left message to see if we have a medical clearance from Dr. Georgina Peer. Attempted to contact the patient with no answer and no machine to leave message

## 2017-02-07 ENCOUNTER — Telehealth: Payer: Self-pay | Admitting: Gynecologic Oncology

## 2017-02-07 NOTE — Telephone Encounter (Signed)
Follow up    Calling to follow up on surgical clearance for pt.

## 2017-02-07 NOTE — Telephone Encounter (Signed)
Please call Bill,the son and let him know the status of the pt's clearance for surgery.He saih he would be available for the next 30 minutes.

## 2017-02-07 NOTE — Telephone Encounter (Signed)
S/w [facility] I informed her that Dr Claiborne Billings has been OOO and will return tomorrow to review. She states that surgery has not been scheduled but pt is in a lot of pain and needs to schedule ASAP.

## 2017-02-07 NOTE — Telephone Encounter (Signed)
Returned call to patient's son.  He was concerned about obtaining cardiac clearance.  Advised that we would continue to follow up and would hope to have recommendations tomorrow.  Advised he would be updated on any changes or information.

## 2017-02-07 NOTE — Telephone Encounter (Signed)
Clearance given for surgery.  Will need to hold Coumadin for at least 4 days prior to surgery

## 2017-02-07 NOTE — Telephone Encounter (Signed)
ok 

## 2017-02-07 NOTE — Telephone Encounter (Signed)
Called to follow up on request for cardiac clearance and coumadin use recommendations.  The patient's son called and stated he felt like nothing was getting done.  His mother, the patient, has been having moderate to severe episodes of lower abd pain intermittently.  We plan for surgery but are awaiting cardiac clearance.  Advised him that I would reach out to Dr. Evette Georges office again.  Advised him to call for any needs or concerns.

## 2017-02-08 ENCOUNTER — Telehealth: Payer: Self-pay

## 2017-02-08 NOTE — Telephone Encounter (Signed)
[name] called stating that she had an episode of LLQ pain that woke her up this am.  It lasted for ~16minutes. Pain 10/10. Told [name] that the cardiac clearance came for her surgery and set her up to see Dr. Denman George on Monday 02-11-17 at 1 pm to discuss surgery planning. [name] is allergic to codeine and therefore can not take tramadol.   Pt will take 500 mg of Tylenol tid for pain as she has recently done.  Told her and her son that a short term prescription for pain medication can discussed at visit with Dr. Denman George. Patient in independent living at [facility].  She has an emergency button to reach a nurse for assistance.  Told her to press this button if she experiences severe pain so she can be evaluated. She is not in any discomfort at the time of this discussion.

## 2017-02-08 NOTE — Telephone Encounter (Signed)
Son aware clearance faxed.

## 2017-02-08 NOTE — Telephone Encounter (Signed)
This note was faxed to the number provided 

## 2017-02-11 ENCOUNTER — Ambulatory Visit: Payer: Medicare Other | Attending: Gynecologic Oncology | Admitting: Gynecologic Oncology

## 2017-02-11 ENCOUNTER — Encounter: Payer: Self-pay | Admitting: Gynecologic Oncology

## 2017-02-11 VITALS — BP 154/68 | HR 73 | Temp 97.6°F | Resp 20

## 2017-02-11 DIAGNOSIS — I2510 Atherosclerotic heart disease of native coronary artery without angina pectoris: Secondary | ICD-10-CM

## 2017-02-11 DIAGNOSIS — N83201 Unspecified ovarian cyst, right side: Secondary | ICD-10-CM | POA: Diagnosis not present

## 2017-02-11 DIAGNOSIS — G629 Polyneuropathy, unspecified: Secondary | ICD-10-CM | POA: Diagnosis not present

## 2017-02-11 DIAGNOSIS — R1032 Left lower quadrant pain: Secondary | ICD-10-CM | POA: Insufficient documentation

## 2017-02-11 DIAGNOSIS — F419 Anxiety disorder, unspecified: Secondary | ICD-10-CM | POA: Diagnosis not present

## 2017-02-11 DIAGNOSIS — E785 Hyperlipidemia, unspecified: Secondary | ICD-10-CM | POA: Insufficient documentation

## 2017-02-11 DIAGNOSIS — Z7901 Long term (current) use of anticoagulants: Secondary | ICD-10-CM | POA: Diagnosis not present

## 2017-02-11 DIAGNOSIS — N83202 Unspecified ovarian cyst, left side: Secondary | ICD-10-CM | POA: Diagnosis not present

## 2017-02-11 DIAGNOSIS — Z87891 Personal history of nicotine dependence: Secondary | ICD-10-CM | POA: Insufficient documentation

## 2017-02-11 DIAGNOSIS — I341 Nonrheumatic mitral (valve) prolapse: Secondary | ICD-10-CM | POA: Insufficient documentation

## 2017-02-11 DIAGNOSIS — I272 Pulmonary hypertension, unspecified: Secondary | ICD-10-CM | POA: Diagnosis not present

## 2017-02-11 DIAGNOSIS — I480 Paroxysmal atrial fibrillation: Secondary | ICD-10-CM | POA: Insufficient documentation

## 2017-02-11 DIAGNOSIS — K429 Umbilical hernia without obstruction or gangrene: Secondary | ICD-10-CM | POA: Diagnosis not present

## 2017-02-11 DIAGNOSIS — I252 Old myocardial infarction: Secondary | ICD-10-CM | POA: Insufficient documentation

## 2017-02-11 DIAGNOSIS — K219 Gastro-esophageal reflux disease without esophagitis: Secondary | ICD-10-CM | POA: Insufficient documentation

## 2017-02-11 DIAGNOSIS — D509 Iron deficiency anemia, unspecified: Secondary | ICD-10-CM | POA: Insufficient documentation

## 2017-02-11 NOTE — Patient Instructions (Signed)
Preparing for your Surgery  Plan for surgery on February 19, 2017 with Dr. Everitt Amber at [place] will be scheduled for a robotic assisted bilateral salpingo-oophorectomy, lysis of adhesions (scar tissue).    Pre-operative Testing -You will receive a phone call from presurgical testing at [facility] to arrange for a pre-operative testing appointment before your surgery.  This appointment normally occurs one to two weeks before your scheduled surgery.   -Bring your insurance card, copy of an advanced directive if applicable, medication list  -At that visit, you will be asked to sign a consent for a possible blood transfusion in case a transfusion becomes necessary during surgery.  The need for a blood transfusion is rare but having consent is a necessary part of your care.     -YOUR LAST DOSE OF COUMADIN WILL BE TONIGHT.  NO MORE COUMADIN AFTER TODAY.  Day Before Surgery at [facility] will be asked to take in a light diet the day before surgery.  Avoid carbonated beverages.  You will be advised to have nothing to eat or drink after midnight the evening before.     Eat a light diet the day before surgery.  Examples including soups, broths, toast, yogurt, mashed potatoes.  Things to avoid include carbonated beverages (fizzy beverages), raw fruits and raw vegetables, or beans.    If your bowels are filled with gas, your surgeon will have difficulty visualizing your pelvic organs which increases your surgical risks.  Your role in recovery Your role is to become active as soon as directed by your doctor, while still giving yourself time to heal.  Rest when you feel tired. You will be asked to do the following in order to speed your recovery:  - Cough and breathe deeply. This helps toclear and expand your lungs and can prevent pneumonia. You may be given a spirometer to practice deep breathing. A staff member will show you how to use the spirometer. - Do mild  physical activity. Walking or moving your legs help your circulation and body functions return to normal. A staff member will help you when you try to walk and will provide you with simple exercises. Do not try to get up or walk alone the first time. - Actively manage your pain. Managing your pain lets you move in comfort. We will ask you to rate your pain on a scale of zero to 10. It is your responsibility to tell your doctor or nurse where and how much you hurt so your pain can be treated.   Blood Transfusion Information WHAT IS A BLOOD TRANSFUSION? A transfusion is the replacement of blood or some of its parts. Blood is made up of multiple cells which provide different functions.  Red blood cells carry oxygen and are used for blood loss replacement.  White blood cells fight against infection.  Platelets control bleeding.  Plasma helps clot blood.  Other blood products are available for specialized needs, such as hemophilia or other clotting disorders. BEFORE THE TRANSFUSION  Who gives blood for transfusions?   You may be able to donate blood to be used at a later date on yourself (autologous donation).  Relatives can be asked to donate blood. This is generally not any safer than if you have received blood from a stranger. The same precautions are taken to ensure safety when a relative's blood is donated.  Healthy volunteers who are fully evaluated to make sure their blood is safe. This is blood  bank blood. Transfusion therapy is the safest it has ever been in the practice of medicine. Before blood is taken from a donor, a complete history is taken to make sure that person has no history of diseases nor engages in risky social behavior (examples are intravenous drug use or sexual activity with multiple partners). The donor's travel history is screened to minimize risk of transmitting infections, such as malaria. The donated blood is tested for signs of infectious diseases, such as HIV and  hepatitis. The blood is then tested to be sure it is compatible with you in order to minimize the chance of a transfusion reaction. If you or a relative donates blood, this is often done in anticipation of surgery and is not appropriate for emergency situations. It takes many days to process the donated blood. RISKS AND COMPLICATIONS Although transfusion therapy is very safe and saves many lives, the main dangers of transfusion include:   Getting an infectious disease.  Developing a transfusion reaction. This is an allergic reaction to something in the blood you were given. Every precaution is taken to prevent this. The decision to have a blood transfusion has been considered carefully by your caregiver before blood is given. Blood is not given unless the benefits outweigh the risks.  Post-op instructions  02/11/2017  Activity: 1. Be up and out of the bed during the day.  Take a nap if needed.  You may walk up steps but be careful and use the hand rail.  Stair climbing will tire you more than you think, you may need to stop part way and rest.   2. No lifting or straining for 6 weeks.  3. No driving for 1 week(s) if you were driving previous.  Do not drive if you are taking narcotic pain medicine.  4. Shower daily.  Use soap and water on your incision and pat dry; don't rub.  No tub baths until cleared by your surgeon.   5. You may experience a small amount of clear drainage from your incisions, which is normal.  If the drainage persists or increases, please call the office.  Diet: 1. Low sodium Heart Healthy Diet is recommended.  2. It is safe to use a laxative, such as Miralax or Colace, if you have difficulty moving your bowels.   Wound Care: 1. Keep clean and dry.  Shower daily.  Reasons to call the Doctor:  Fever - Oral temperature greater than 100.4 degrees Fahrenheit  Foul-smelling vaginal discharge  Difficulty urinating  Nausea and vomiting  Increased pain at the site  of the incision that is unrelieved with pain medicine.  Difficulty breathing with or without chest pain  New calf pain especially if only on one side  Sudden, continuing increased vaginal bleeding with or without clots.   Contacts: For questions or concerns you should contact:  Dr. Everitt Amber at [phone]  Joylene John, NP at [phone]  After Hours: call [phone] and have the GYN Oncologist paged/contacted

## 2017-02-11 NOTE — Progress Notes (Signed)
Consult Note: Gyn-Onc   [name] 83 y.o. female  Chief Complaint  Patient presents with  . Ovarian cyst of right Ovary    Assessment New-onset of intermittent left lower quadrant pain possibly representing intermittent ovarian torsion.  :Left ovarian cyst documented on serial imaging since December 2014. Normal CA-125 values.  Hx of CAD and atrial fibrillation - cardiac clearance for surgery obtained  Plan: I met with the patient and her son to review her ultrasound and current situation. Given that the pain score is 5 out of 10 and represents approximately 30% of her day I would recommend the ovarian cyst be removed. Hopefully we can do this laparoscopically or robotically. However, given that she has had multiple prior laparotomies ( cesarean sections, hysterectomy, appendectomy, open chole, open umbilical hernia (possibly with mesh)) she is likely to have significant adhesive disease. I discussed with the patient that this places her at higher surgical risk, and raised the likelihood that she will need laparotomy.   I discussed risks of surgery including  bleeding, infection, damage to internal organs (such as bladder,ureters, bowels), blood clot, reoperation and rehospitalization. I discussed anticipated postop healing and recovery - I recommended she look into assisted living assistance immediately postop.  She will hold her coumadin for 7 days preop. Bridge will not be introduced as she is on this for a fib without heart failure. I will restart postop if there are no signs of bleeding.  The patient expressed understanding that the risks (particularly GI injury due to scar tissue, bleeding risk due to coumadin use) are higher for her given her medical history and advanced age. However, due to her symptoms, she feels strongly in pursuing surgery.  Interval history: The patient presents today with symptoms of persistent pains and an [place] on 02/04/17 showing a left ovary measuring  4.9x3.4x4.4cm (slightly smaller than previous scan). No septations or mural nodules). She has seen her cardiologist and received "clearance" for surgery.  HPI: 83 year old white widowed female para 1 seen in consultation request of Dr. [name] regarding management of a left ovarian cyst. Review the patient's records reveals that she has had serial imaging since December 2014. At that time she was found to have a simple cyst measuring 3.7 x 3.7 x 3.0 cm. In November 2016 and ultrasound showed the cyst measuring 4.6 x 3.6 x 4.5 cm. There is no nodularity or free fluid. MRI on 08/04/2015 reaffirmed the ultrasound findings. On 08/06/2016 the patient had another ultrasound showing slight increased size and the cyst now measuring 5.2 x 4.0 x 4.6 cm. There was a 1.4 cm nodule. Recent CA-125 was 9 units per mL and a year prior was 11 units per mL.  The patient is entirely asymptomatic. She denies any pelvic pain or pressure or any other GI or GU symptoms.  She has a past history of having an abdominal hysterectomy and uterine suspension.  Review of Systems:10 point review of systems is negative except as noted in interval history.   Vitals: Blood pressure (!) 154/68, pulse 73, temperature 97.6 F (36.4 C), resp. rate 20, SpO2 100 %.  Physical Exam: General : The patient is a healthy woman in no acute distress.  HEENT: normocephalic, extraoccular movements normal; neck is supple without thyromegally  Lynphnodes: Supraclavicular and inguinal nodes not enlarged  Abdomen: Soft, non-tender, no ascites, no organomegally, no masses, no hernias , she has a midline incision as well as a subcostal incision and a incision at McBurney's point.  Pelvic  exam:  EGBUS normal  Vagina is atrophic. Cervix and uterus surgically absent  Bimanual exam reveals some fullness in the left adnexa with some slight tenderness. There is no rebound.   Lower extremities: No edema or varicosities. Normal range of motion       Allergies  Allergen Reactions  . Avelox [Moxifloxacin Hcl In Nacl] Shortness Of Breath and Swelling  . Moxifloxacin Other (See Comments), Shortness Of Breath and Swelling    other  . Aricept [Donepezil Hcl] Diarrhea  . Codeine Swelling       . Donepezil Diarrhea  . Garlic Other (See Comments)    Gi problems  . Latex Hives, Itching, Rash and Other (See Comments)    Watery blisters   . Onion Other (See Comments)    Gi problems  . Sulfa Drugs Cross Reactors Swelling  . Other Other (See Comments)    Her throat closes up  . Duloxetine Rash  . Polysporin [Bacitracin-Polymyxin B] Other (See Comments)    other    Past Medical History:  Diagnosis Date  . Abnormality of gait 09/07/2015  . Anemia, iron deficiency 09/16/2014  . Anxiety   . CAD (coronary artery disease)    a. 08/2014 NSTEMI/Cath: mild Ca2+ or RCA ostium, otw nl cors. CO 3.3 L/min (thermo), 3.7 L/min ([place]).  . Carotid arterial disease ([place]) 07/30/2012   carotid doppler; normal study  . Chronic anticoagulation, with coumadin, with PAF and CHADS2Vasc2 score of 4 09/16/2014  . Degenerative arthritis   . Diverticulosis   . Essential hypertension   . Foot fracture, left   . GERD (gastroesophageal reflux disease)   . History of nuclear stress test 09/19/2007   normal pattern of perfusion; post-stress EF 86%; EKG negative for ischemia; low risk scan  . Hyperlipidemia   . Left carotid bruit    a. 07/2015 Carotid U/S: 1-39% bilat ICA stenosis.  . Memory disorder 03/05/2014  . Mild renal insufficiency   . Mitral prolapse   . Neuropathy   . PAF (paroxysmal atrial fibrillation) (HCC)    a. 08/2014-->Coumadin (CHA2DS2VASc = 4).  . Peripheral neuropathy    Small fiber   . Pre-syncope    a. 04/2015 in setting of bradycardia-->CCB/BB doses adjusted.  . Pulmonary hypertension ([place])   . Valvular heart disease    a. 04/2015 Echo: EF 65-70%, no rwma, Gr1 DD, mild AS, triv AI, mild MS, mod MR, mod dil LA, mod TR, sev increased  PASP.    Past Surgical History:  Procedure Laterality Date  .  Bilateral bunionectomies    . ABDOMINAL HYSTERECTOMY    . APPENDECTOMY    . DILATION AND CURETTAGE OF UTERUS    . FOOT FRACTURE SURGERY Left   . gallbladder resection    . LEFT HEART CATHETERIZATION WITH CORONARY ANGIOGRAM N/A 09/14/2014   Procedure: LEFT HEART CATHETERIZATION WITH CORONARY ANGIOGRAM;  Surgeon: Troy Sine, MD;  Location: [facility] CATH LAB;  Service: Cardiovascular;  Laterality: N/A;  . REPLACEMENT TOTAL KNEE Right   . resuspension procedure    . TEAR DUCT PROBING WITH STRABISMUS REPAIR     Tear duct repair surgery  . TONSILLECTOMY    . UMBILICAL HERNIA REPAIR      Current Outpatient Prescriptions  Medication Sig Dispense Refill  . acetaminophen (TYLENOL) 500 MG tablet Take 500 mg by mouth.    Marland Kitchen atorvastatin (LIPITOR) 20 MG tablet Take 1 tablet (20 mg total) by mouth at bedtime. 90 tablet 2  . carvedilol (COREG) 3.125 MG  tablet TAKE 1 TABLET BY MOUTH TWICE A DAY 180 tablet 0  . cetirizine (ZYRTEC) 10 MG tablet Take 10 mg by mouth.    . cholecalciferol (VITAMIN D) 1000 UNITS tablet Take 1,000 Units by mouth daily.      . colestipol (COLESTID) 1 G tablet Take 1 g by mouth 3 (three) times daily. For diarrhea and does not take at the same time as coumadin    . Cyanocobalamin (RA VITAMIN B-12 TR) 1000 MCG TBCR Take by mouth.    . cycloSPORINE (RESTASIS) 0.05 % ophthalmic emulsion Place 1 drop into both eyes daily.      Marland Kitchen diltiazem (CARDIZEM SR) 60 MG 12 hr capsule Take 1 capsule (60 mg total) by mouth every 12 (twelve) hours. 180 capsule 1  . fenofibrate micronized (LOFIBRA) 134 MG capsule TAKE ONE CAPSULE BY MOUTH ONCE DAILY 90 capsule 2  . gabapentin (NEURONTIN) 300 MG capsule TAKE 1 CAPSULE THREE TIMES DAILY AND 3 CAPSULES AT BEDTIME 540 capsule 1  . hydrALAZINE (APRESOLINE) 25 MG tablet Take 1 tablet (25 mg total) by mouth 3 (three) times daily. 270 tablet 3  . hydrochlorothiazide (HYDRODIURIL) 25 MG tablet  TAKE 1 TABLET EVERY DAY 90 tablet 3  . ipratropium (ATROVENT) 0.06 % nasal spray Place 1 spray into both nostrils as directed.     . Lactobacillus (ACIDOPHILUS) 100 MG CAPS Take 100 mg by mouth daily.     Marland Kitchen loperamide (IMODIUM) 2 MG capsule Take 1 capsule (2 mg total) by mouth 2 (two) times daily as needed for diarrhea or loose stools. 30 capsule 0  . memantine (NAMENDA) 10 MG tablet Take 1 tablet (10 mg total) by mouth 2 (two) times daily. 180 tablet 3  . Multiple Vitamin (MULTIVITAMIN) tablet Take 1 tablet by mouth daily.      . Multiple Vitamins-Minerals (VISION FORMULA/LUTEIN) TABS Take 1 tablet by mouth daily.    . Omega-3 Fatty Acids (FISH OIL PO) Take by mouth daily.    . pantoprazole (PROTONIX) 40 MG tablet Take 40 mg by mouth daily.     Marland Kitchen pyridOXINE (VITAMIN B-6) 100 MG tablet Take 100 mg by mouth daily.    . ranitidine (ZANTAC) 150 MG tablet     . simethicone (MYLICON) 846 MG chewable tablet Chew 125 mg by mouth as needed for flatulence.    . warfarin (COUMADIN) 2 MG tablet Take 1.5 to 2 tablets by mouth daily as directed by coumadin clinic 60 tablet 3   No current facility-administered medications for this visit.     Social History   Social History  . Marital status: Widowed    Spouse name: N/A  . Number of children: 2  . Years of education: AS   Occupational History  . Retired    Social History Main Topics  . Smoking status: Former Research scientist (life sciences)  . Smokeless tobacco: Never Used  . Alcohol use No  . Drug use: No  . Sexual activity: No   Other Topics Concern  . Not on file   Social History Narrative   Resides at [facility]   Patient is left handed, but uses right handed.   Patient drinks one cup caffeine daily.       Family History  Problem Relation Age of Onset  . Pneumonia Mother   . Coronary artery disease Mother   . Hypertension Mother   . Heart disease Father   . Cancer Father   . Hypertension Father   . Arthritis Sister  [name], MD 02/11/2017, 5:44 PM

## 2017-02-12 NOTE — Patient Instructions (Addendum)
[name]  02/12/2017   Your procedure is scheduled on: 02/19/2017    Report to [facility] Main  Entrance Take [place]  elevators to 3rd floor to  [place] at    [place] AM.    Call this number if you have problems the morning of surgery [phone]    Remember: ONLY 1 PERSON MAY GO WITH YOU TO SHORT STAY TO GET  READY MORNING OF YOUR SURGERY.  Do not eat food or drink liquids :After Midnight.              Eat a light diet the day before surgery.  Examples include: soups, broths, toast, yogurt and mashed potatoes.  Avoid carbonated beverages, raw fruits and  vegetables and beans.       Take these medicines the morning of surgery with A SIP OF WATER: carvedilol(coreg), diltiazem(cardizem), hydralazine(apresoline), gabapentin, eye drops, colestipol(colestid), inhaler as needed (may bring to hospital) , TYLENOL AS NEEDED                                You may not have any metal on your body including hair pins and              piercings  Do not wear jewelry, make-up, lotions, powders or perfumes, deodorant             Do not wear nail polish.  Do not shave  48 hours prior to surgery.                Do not bring valuables to the hospital. [place].  Contacts, dentures or bridgework may not be worn into surgery.       Patients discharged the day of surgery will not be allowed to drive home.  Name and phone number of your driver:               Please read over the following fact sheets you were given: _____________________________________________________________________             [facility] - Preparing for Surgery Before surgery, you can play an important role.  Because skin is not sterile, your skin needs to be as free of germs as possible.  You can reduce the number of germs on your skin by washing with CHG (chlorahexidine gluconate) soap before surgery.  CHG is an antiseptic cleaner which  kills germs and bonds with the skin to continue killing germs even after washing. Please DO NOT use if you have an allergy to CHG or antibacterial soaps.  If your skin becomes reddened/irritated stop using the CHG and inform your nurse when you arrive at Short Stay. Do not shave (including legs and underarms) for at least 48 hours prior to the first CHG shower.  You may shave your face/neck. Please follow these instructions carefully:  1.  Shower with CHG Soap the night before surgery and the  morning of Surgery.  2.  If you choose to wash your hair, wash your hair first as usual with your  normal  shampoo.  3.  After you shampoo, rinse your hair and body thoroughly to remove the  shampoo.  4.  Use CHG as you would any other liquid soap.  You can apply chg directly  to the skin and wash                       Gently with a scrungie or clean washcloth.  5.  Apply the CHG Soap to your body ONLY FROM THE NECK DOWN.   Do not use on face/ open                           Wound or open sores. Avoid contact with eyes, ears mouth and genitals (private parts).                       Wash face,  Genitals (private parts) with your normal soap.             6.  Wash thoroughly, paying special attention to the area where your surgery  will be performed.  7.  Thoroughly rinse your body with warm water from the neck down.  8.  DO NOT shower/wash with your normal soap after using and rinsing off  the CHG Soap.                9.  Pat yourself dry with a clean towel.            10.  Wear clean pajamas.            11.  Place clean sheets on your bed the night of your first shower and do not  sleep with pets. Day of Surgery : Do not apply any lotions/deodorants the morning of surgery.  Please wear clean clothes to the hospital/surgery center.  FAILURE TO FOLLOW THESE INSTRUCTIONS MAY RESULT IN THE CANCELLATION OF YOUR SURGERY PATIENT SIGNATURE_________________________________  NURSE  SIGNATURE__________________________________  ________________________________________________________________________  WHAT IS A BLOOD TRANSFUSION? Blood Transfusion Information  A transfusion is the replacement of blood or some of its parts. Blood is made up of multiple cells which provide different functions.  Red blood cells carry oxygen and are used for blood loss replacement.  White blood cells fight against infection.  Platelets control bleeding.  Plasma helps clot blood.  Other blood products are available for specialized needs, such as hemophilia or other clotting disorders. BEFORE THE TRANSFUSION  Who gives blood for transfusions?   Healthy volunteers who are fully evaluated to make sure their blood is safe. This is blood bank blood. Transfusion therapy is the safest it has ever been in the practice of medicine. Before blood is taken from a donor, a complete history is taken to make sure that person has no history of diseases nor engages in risky social behavior (examples are intravenous drug use or sexual activity with multiple partners). The donor's travel history is screened to minimize risk of transmitting infections, such as malaria. The donated blood is tested for signs of infectious diseases, such as HIV and hepatitis. The blood is then tested to be sure it is compatible with you in order to minimize the chance of a transfusion reaction. If you or a relative donates blood, this is often done in anticipation of surgery and is not appropriate for emergency situations. It takes many days to process the donated blood. RISKS AND COMPLICATIONS Although transfusion therapy is very safe and saves many lives, the main dangers of transfusion include:   Getting an infectious disease.  Developing a transfusion reaction. This  is an allergic reaction to something in the blood you were given. Every precaution is taken to prevent this. The decision to have a blood transfusion has been  considered carefully by your caregiver before blood is given. Blood is not given unless the benefits outweigh the risks. AFTER THE TRANSFUSION  Right after receiving a blood transfusion, you will usually feel much better and more energetic. This is especially true if your red blood cells have gotten low (anemic). The transfusion raises the level of the red blood cells which carry oxygen, and this usually causes an energy increase.  The nurse administering the transfusion will monitor you carefully for complications. HOME CARE INSTRUCTIONS  No special instructions are needed after a transfusion. You may find your energy is better. Speak with your caregiver about any limitations on activity for underlying diseases you may have. SEEK MEDICAL CARE IF:   Your condition is not improving after your transfusion.  You develop redness or irritation at the intravenous (IV) site. SEEK IMMEDIATE MEDICAL CARE IF:  Any of the following symptoms occur over the next 12 hours:  Shaking chills.  You have a temperature by mouth above 102 F (38.9 C), not controlled by medicine.  Chest, back, or muscle pain.  People around you feel you are not acting correctly or are confused.  Shortness of breath or difficulty breathing.  Dizziness and fainting.  You get a rash or develop hives.  You have a decrease in urine output.  Your urine turns a dark color or changes to pink, red, or brown. Any of the following symptoms occur over the next 10 days:  You have a temperature by mouth above 102 F (38.9 C), not controlled by medicine.  Shortness of breath.  Weakness after normal activity.  The white part of the eye turns yellow (jaundice).  You have a decrease in the amount of urine or are urinating less often.  Your urine turns a dark color or changes to pink, red, or brown. Document Released: 08/03/2000 Document Revised: 10/29/2011 Document Reviewed: 03/22/2008 ExitCare Patient Information 2014  [place].  _______________________________________________________________________  Incentive Spirometer  An incentive spirometer is a tool that can help keep your lungs clear and active. This tool measures how well you are filling your lungs with each breath. Taking long deep breaths may help reverse or decrease the chance of developing breathing (pulmonary) problems (especially infection) following:  A long period of time when you are unable to move or be active. BEFORE THE PROCEDURE   If the spirometer includes an indicator to show your best effort, your nurse or respiratory therapist will set it to a desired goal.  If possible, sit up straight or lean slightly forward. Try not to slouch.  Hold the incentive spirometer in an upright position. INSTRUCTIONS FOR USE  1. Sit on the edge of your bed if possible, or sit up as far as you can in bed or on a chair. 2. Hold the incentive spirometer in an upright position. 3. Breathe out normally. 4. Place the mouthpiece in your mouth and seal your lips tightly around it. 5. Breathe in slowly and as deeply as possible, raising the piston or the ball toward the top of the column. 6. Hold your breath for 3-5 seconds or for as long as possible. Allow the piston or ball to fall to the bottom of the column. 7. Remove the mouthpiece from your mouth and breathe out normally. 8. Rest for a few seconds and repeat Steps 1  through 7 at least 10 times every 1-2 hours when you are awake. Take your time and take a few normal breaths between deep breaths. 9. The spirometer may include an indicator to show your best effort. Use the indicator as a goal to work toward during each repetition. 10. After each set of 10 deep breaths, practice coughing to be sure your lungs are clear. If you have an incision (the cut made at the time of surgery), support your incision when coughing by placing a pillow or rolled up towels firmly against it. Once you are able to get  out of bed, walk around indoors and cough well. You may stop using the incentive spirometer when instructed by your caregiver.  RISKS AND COMPLICATIONS  Take your time so you do not get dizzy or light-headed.  If you are in pain, you may need to take or ask for pain medication before doing incentive spirometry. It is harder to take a deep breath if you are having pain. AFTER USE  Rest and breathe slowly and easily.  It can be helpful to keep track of a log of your progress. Your caregiver can provide you with a simple table to help with this. If you are using the spirometer at home, follow these instructions: [place] IF:   You are having difficultly using the spirometer.  You have trouble using the spirometer as often as instructed.  Your pain medication is not giving enough relief while using the spirometer.  You develop fever of 100.5 F (38.1 C) or higher. SEEK IMMEDIATE MEDICAL CARE IF:   You cough up bloody sputum that had not been present before.  You develop fever of 102 F (38.9 C) or greater.  You develop worsening pain at or near the incision site. MAKE SURE YOU:   Understand these instructions.  Will watch your condition.  Will get help right away if you are not doing well or get worse. Document Released: 12/17/2006 Document Revised: 10/29/2011 Document Reviewed: 02/17/2007 [facility] Patient Information 2014 [place], [place].   ________________________________________________________________________

## 2017-02-13 ENCOUNTER — Encounter (HOSPITAL_COMMUNITY)
Admission: RE | Admit: 2017-02-13 | Discharge: 2017-02-13 | Disposition: A | Discharge status: Home / Self Care | Payer: Medicare Other | Source: Ambulatory Visit | Attending: Gynecologic Oncology | Admitting: Gynecologic Oncology

## 2017-02-13 ENCOUNTER — Encounter (HOSPITAL_COMMUNITY): Payer: Self-pay

## 2017-02-13 DIAGNOSIS — N83292 Other ovarian cyst, left side: Secondary | ICD-10-CM | POA: Insufficient documentation

## 2017-02-13 DIAGNOSIS — Z01812 Encounter for preprocedural laboratory examination: Secondary | ICD-10-CM | POA: Insufficient documentation

## 2017-02-13 LAB — URINALYSIS, ROUTINE W REFLEX MICROSCOPIC
Bilirubin Urine: NEGATIVE
Glucose, UA: NEGATIVE mg/dL
Hgb urine dipstick: NEGATIVE
Ketones, ur: NEGATIVE mg/dL
Leukocytes, UA: NEGATIVE
Nitrite: NEGATIVE
Protein, ur: NEGATIVE mg/dL
Specific Gravity, Urine: 1.013 (ref 1.005–1.030)
pH: 7 (ref 5.0–8.0)

## 2017-02-13 LAB — CBC
HCT: 33.7 % — ABNORMAL LOW (ref 36.0–46.0)
Hemoglobin: 11.2 g/dL — ABNORMAL LOW (ref 12.0–15.0)
MCH: 27.8 pg (ref 26.0–34.0)
MCHC: 33.2 g/dL (ref 30.0–36.0)
MCV: 83.6 fL (ref 78.0–100.0)
Platelets: 350 10*3/uL (ref 150–400)
RBC: 4.03 MIL/uL (ref 3.87–5.11)
RDW: 13.7 % (ref 11.5–15.5)
WBC: 7.3 10*3/uL (ref 4.0–10.5)

## 2017-02-13 LAB — COMPREHENSIVE METABOLIC PANEL
ALT: 16 U/L (ref 14–54)
AST: 26 U/L (ref 15–41)
Albumin: 4 g/dL (ref 3.5–5.0)
Alkaline Phosphatase: 45 U/L (ref 38–126)
Anion gap: 11 (ref 5–15)
BUN: 25 mg/dL — ABNORMAL HIGH (ref 6–20)
CO2: 26 mmol/L (ref 22–32)
Calcium: 9.8 mg/dL (ref 8.9–10.3)
Chloride: 102 mmol/L (ref 101–111)
Creatinine, Ser: 0.83 mg/dL (ref 0.44–1.00)
GFR calc Af Amer: >60 mL/min (ref >60)
GFR calc non Af Amer: 59 mL/min — ABNORMAL LOW (ref >60)
Glucose, Bld: 90 mg/dL (ref 65–99)
Potassium: 4.3 mmol/L (ref 3.5–5.1)
Sodium: 139 mmol/L (ref 135–145)
Total Bilirubin: 0.3 mg/dL (ref 0.3–1.2)
Total Protein: 8 g/dL (ref 6.5–8.1)

## 2017-02-13 NOTE — Progress Notes (Signed)
Cardiology clearance Dr Claiborne Billings, telephone note in epic 02-05-17 .  EKG 11-30-16 epic  ECHO 12-19-16 epic

## 2017-02-15 ENCOUNTER — Other Ambulatory Visit: Payer: Self-pay | Admitting: Gynecologic Oncology

## 2017-02-15 NOTE — Progress Notes (Signed)
Patient called with questions about lifting restrictions and about what medications she can take the day of surgery.  Reviewed the list of medications she is to take based on the note from pre-surgical testing.

## 2017-02-18 ENCOUNTER — Encounter (HOSPITAL_COMMUNITY): Payer: Self-pay | Admitting: Anesthesiology

## 2017-02-18 NOTE — Anesthesia Preprocedure Evaluation (Addendum)
Anesthesia Evaluation  Patient identified by MRN, date of birth, ID band Patient awake    Reviewed: Allergy & Precautions, NPO status , Patient's Chart, lab work & pertinent test results, reviewed documented beta blocker date and time   Airway Mallampati: II  TM Distance: >3 FB Neck ROM: Full    Dental no notable dental hx. (+) Caps, Dental Advisory Given   Pulmonary neg pulmonary ROS, former smoker,    Pulmonary exam normal breath sounds clear to auscultation       Cardiovascular hypertension, Pt. on medications and Pt. on home beta blockers + CAD, + Past MI and + Peripheral Vascular Disease  Normal cardiovascular exam+ dysrhythmias Atrial Fibrillation  Rhythm:Regular Rate:Normal  Non STEMI- 08/2014 Pulmonary HTN- severe TR Mild AS Hx/o Paroxysmal A. Fib Bilateral non obstructive ICA occlusions   Neuro/Psych Anxiety Peripheral neuropathy Abnormality of gait Poor memory  Neuromuscular disease    GI/Hepatic Neg liver ROS, GERD  Medicated and Controlled,Diverticulosis   Endo/Other  Hyperlipidemia  Renal/GU Renal InsufficiencyRenal disease  negative genitourinary   Musculoskeletal  (+) Arthritis , Osteoarthritis,    Abdominal   Peds  Hematology  (+) anemia , Chronic anticoagulation with coumadin   Anesthesia Other Findings   Reproductive/Obstetrics                           Anesthesia Physical Anesthesia Plan  ASA: III  Anesthesia Plan: General   Post-op Pain Management:    Induction: Intravenous  PONV Risk Score and Plan: 4 or greater and Ondansetron, Dexamethasone, Propofol and Midazolam  Airway Management Planned: Oral ETT  Additional Equipment:   Intra-op Plan:   Post-operative Plan: Extubation in OR  Informed Consent: I have reviewed the patients History and Physical, chart, labs and discussed the procedure including the risks, benefits and alternatives for the proposed  anesthesia with the patient or authorized representative who has indicated his/her understanding and acceptance.     Plan Discussed with:   Anesthesia Plan Comments:         Anesthesia Quick Evaluation

## 2017-02-19 ENCOUNTER — Encounter (HOSPITAL_COMMUNITY): Payer: Self-pay | Admitting: *Deleted

## 2017-02-19 ENCOUNTER — Ambulatory Visit (HOSPITAL_COMMUNITY)
Admission: RE | Admit: 2017-02-19 | Discharge: 2017-02-20 | Disposition: A | Discharge status: Home / Self Care | Payer: Medicare Other | Source: Ambulatory Visit | Attending: Gynecologic Oncology | Admitting: Gynecologic Oncology

## 2017-02-19 ENCOUNTER — Encounter (HOSPITAL_COMMUNITY)
Admission: RE | Disposition: A | Discharge status: Home / Self Care | Payer: Self-pay | Source: Ambulatory Visit | Attending: Gynecologic Oncology

## 2017-02-19 ENCOUNTER — Ambulatory Visit (HOSPITAL_COMMUNITY): Payer: Medicare Other | Admitting: Anesthesiology

## 2017-02-19 DIAGNOSIS — I341 Nonrheumatic mitral (valve) prolapse: Secondary | ICD-10-CM | POA: Diagnosis not present

## 2017-02-19 DIAGNOSIS — I10 Essential (primary) hypertension: Secondary | ICD-10-CM | POA: Diagnosis not present

## 2017-02-19 DIAGNOSIS — Z87891 Personal history of nicotine dependence: Secondary | ICD-10-CM | POA: Diagnosis not present

## 2017-02-19 DIAGNOSIS — D271 Benign neoplasm of left ovary: Secondary | ICD-10-CM | POA: Diagnosis not present

## 2017-02-19 DIAGNOSIS — Z96651 Presence of right artificial knee joint: Secondary | ICD-10-CM | POA: Insufficient documentation

## 2017-02-19 DIAGNOSIS — I252 Old myocardial infarction: Secondary | ICD-10-CM | POA: Diagnosis not present

## 2017-02-19 DIAGNOSIS — Z7901 Long term (current) use of anticoagulants: Secondary | ICD-10-CM | POA: Diagnosis not present

## 2017-02-19 DIAGNOSIS — E785 Hyperlipidemia, unspecified: Secondary | ICD-10-CM | POA: Insufficient documentation

## 2017-02-19 DIAGNOSIS — G629 Polyneuropathy, unspecified: Secondary | ICD-10-CM | POA: Diagnosis not present

## 2017-02-19 DIAGNOSIS — I739 Peripheral vascular disease, unspecified: Secondary | ICD-10-CM | POA: Diagnosis not present

## 2017-02-19 DIAGNOSIS — I2510 Atherosclerotic heart disease of native coronary artery without angina pectoris: Secondary | ICD-10-CM | POA: Diagnosis not present

## 2017-02-19 DIAGNOSIS — R8569 Abnormal cytological findings in specimens from other digestive organs and abdominal cavity: Secondary | ICD-10-CM | POA: Diagnosis not present

## 2017-02-19 DIAGNOSIS — I6523 Occlusion and stenosis of bilateral carotid arteries: Secondary | ICD-10-CM | POA: Diagnosis not present

## 2017-02-19 DIAGNOSIS — N83201 Unspecified ovarian cyst, right side: Secondary | ICD-10-CM | POA: Diagnosis present

## 2017-02-19 DIAGNOSIS — K219 Gastro-esophageal reflux disease without esophagitis: Secondary | ICD-10-CM | POA: Diagnosis not present

## 2017-02-19 DIAGNOSIS — N83202 Unspecified ovarian cyst, left side: Secondary | ICD-10-CM | POA: Diagnosis present

## 2017-02-19 DIAGNOSIS — Z79899 Other long term (current) drug therapy: Secondary | ICD-10-CM | POA: Diagnosis not present

## 2017-02-19 DIAGNOSIS — I480 Paroxysmal atrial fibrillation: Secondary | ICD-10-CM | POA: Diagnosis not present

## 2017-02-19 DIAGNOSIS — E782 Mixed hyperlipidemia: Secondary | ICD-10-CM | POA: Diagnosis not present

## 2017-02-19 DIAGNOSIS — R1900 Intra-abdominal and pelvic swelling, mass and lump, unspecified site: Secondary | ICD-10-CM

## 2017-02-19 DIAGNOSIS — I272 Pulmonary hypertension, unspecified: Secondary | ICD-10-CM | POA: Diagnosis not present

## 2017-02-19 HISTORY — PX: ROBOTIC ASSISTED BILATERAL SALPINGO OOPHERECTOMY: SHX6078

## 2017-02-19 LAB — PROTIME-INR
INR: 1.13
Prothrombin Time: 14.5 seconds (ref 11.4–15.2)

## 2017-02-19 LAB — TYPE AND SCREEN
ABO/RH(D): O POS
Antibody Screen: NEGATIVE

## 2017-02-19 SURGERY — SALPINGO-OOPHORECTOMY, BILATERAL, ROBOT-ASSISTED
Anesthesia: General | Laterality: Bilateral

## 2017-02-19 MED ORDER — DILTIAZEM HCL ER 60 MG PO CP12
60.0000 mg | ORAL_CAPSULE | Freq: Two times a day (BID) | ORAL | Status: DC
  Administered 2017-02-20: 60 mg via ORAL
  Filled 2017-02-19: qty 1

## 2017-02-19 MED ORDER — ONDANSETRON HCL 4 MG PO TABS: 4.0000 mg | ORAL_TABLET | Freq: Four times a day (QID) | ORAL | Status: DC | PRN

## 2017-02-19 MED ORDER — ONDANSETRON HCL 4 MG/2ML IJ SOLN: 4.0000 mg | Freq: Four times a day (QID) | INTRAMUSCULAR | Status: DC | PRN

## 2017-02-19 MED ORDER — KCL IN DEXTROSE-NACL 20-5-0.45 MEQ/L-%-% IV SOLN
INTRAVENOUS | Status: DC
  Administered 2017-02-19 – 2017-02-20 (×2): via INTRAVENOUS
  Filled 2017-02-19 (×2): qty 1000

## 2017-02-19 MED ORDER — ACETAMINOPHEN 500 MG PO TABS
1000.0000 mg | ORAL_TABLET | Freq: Two times a day (BID) | ORAL | Status: DC
  Administered 2017-02-19 – 2017-02-20 (×2): 1000 mg via ORAL
  Filled 2017-02-19 (×2): qty 2

## 2017-02-19 MED ORDER — BUPIVACAINE HCL (PF) 0.25 % IJ SOLN
INTRAMUSCULAR | Status: AC
  Filled 2017-02-19: qty 30

## 2017-02-19 MED ORDER — COLESTIPOL HCL 1 G PO TABS
1.0000 g | ORAL_TABLET | Freq: Three times a day (TID) | ORAL | Status: DC
  Administered 2017-02-19 (×2): 1 g via ORAL
  Filled 2017-02-19 (×4): qty 1

## 2017-02-19 MED ORDER — FENTANYL CITRATE (PF) 100 MCG/2ML IJ SOLN
INTRAMUSCULAR | Status: AC
  Filled 2017-02-19: qty 2

## 2017-02-19 MED ORDER — GABAPENTIN 300 MG PO CAPS
300.0000 mg | ORAL_CAPSULE | Freq: Every day | ORAL | Status: DC
  Administered 2017-02-19: 300 mg via ORAL
  Filled 2017-02-19: qty 1

## 2017-02-19 MED ORDER — IBUPROFEN 200 MG PO TABS: 600.0000 mg | ORAL_TABLET | Freq: Three times a day (TID) | ORAL | Status: DC | PRN

## 2017-02-19 MED ORDER — SUCCINYLCHOLINE CHLORIDE 200 MG/10ML IV SOSY
PREFILLED_SYRINGE | INTRAVENOUS | Status: AC
  Filled 2017-02-19: qty 10

## 2017-02-19 MED ORDER — ONDANSETRON HCL 4 MG/2ML IJ SOLN: 4.0000 mg | Freq: Once | INTRAMUSCULAR | Status: DC | PRN

## 2017-02-19 MED ORDER — ROCURONIUM BROMIDE 50 MG/5ML IV SOSY
PREFILLED_SYRINGE | INTRAVENOUS | Status: AC
  Filled 2017-02-19: qty 5

## 2017-02-19 MED ORDER — PROPOFOL 10 MG/ML IV BOLUS
INTRAVENOUS | Status: AC
  Filled 2017-02-19: qty 20

## 2017-02-19 MED ORDER — PANTOPRAZOLE SODIUM 40 MG PO TBEC
40.0000 mg | DELAYED_RELEASE_TABLET | Freq: Every day | ORAL | Status: DC
  Administered 2017-02-20: 40 mg via ORAL
  Filled 2017-02-19: qty 1

## 2017-02-19 MED ORDER — SUGAMMADEX SODIUM 200 MG/2ML IV SOLN
INTRAVENOUS | Status: DC | PRN
  Administered 2017-02-19: 150 mg via INTRAVENOUS

## 2017-02-19 MED ORDER — FENTANYL CITRATE (PF) 100 MCG/2ML IJ SOLN
25.0000 ug | INTRAMUSCULAR | Status: DC | PRN
Start: 2017-02-19 — End: 2017-02-19
  Administered 2017-02-19: 25 ug via INTRAVENOUS
  Administered 2017-02-19: 50 ug via INTRAVENOUS
  Administered 2017-02-19: 25 ug via INTRAVENOUS

## 2017-02-19 MED ORDER — CEFAZOLIN SODIUM-DEXTROSE 2-4 GM/100ML-% IV SOLN
2.0000 g | INTRAVENOUS | Status: AC
  Administered 2017-02-19: 2 g via INTRAVENOUS
  Filled 2017-02-19: qty 100

## 2017-02-19 MED ORDER — ONDANSETRON HCL 4 MG/2ML IJ SOLN
INTRAMUSCULAR | Status: AC
  Filled 2017-02-19: qty 2

## 2017-02-19 MED ORDER — BUPIVACAINE HCL (PF) 0.25 % IJ SOLN
INTRAMUSCULAR | Status: DC | PRN
  Administered 2017-02-19: 20 mL

## 2017-02-19 MED ORDER — LACTATED RINGERS IV SOLN
INTRAVENOUS | Status: DC
  Administered 2017-02-19: 07:00:00 via INTRAVENOUS

## 2017-02-19 MED ORDER — LIDOCAINE 2% (20 MG/ML) 5 ML SYRINGE
INTRAMUSCULAR | Status: AC
  Filled 2017-02-19: qty 5

## 2017-02-19 MED ORDER — FENOFIBRATE 160 MG PO TABS
160.0000 mg | ORAL_TABLET | Freq: Every day | ORAL | Status: DC
  Administered 2017-02-20: 160 mg via ORAL
  Filled 2017-02-19: qty 1

## 2017-02-19 MED ORDER — PROPOFOL 10 MG/ML IV BOLUS
INTRAVENOUS | Status: DC | PRN
  Administered 2017-02-19: 100 mg via INTRAVENOUS

## 2017-02-19 MED ORDER — HYDROMORPHONE HCL 1 MG/ML IJ SOLN: 0.2000 mg | INTRAMUSCULAR | Status: DC | PRN

## 2017-02-19 MED ORDER — ARTIFICIAL TEARS OPHTHALMIC OINT
TOPICAL_OINTMENT | OPHTHALMIC | Status: AC
  Filled 2017-02-19: qty 3.5

## 2017-02-19 MED ORDER — LIDOCAINE 2% (20 MG/ML) 5 ML SYRINGE
INTRAMUSCULAR | Status: DC | PRN
  Administered 2017-02-19: 60 mg via INTRAVENOUS

## 2017-02-19 MED ORDER — ROCURONIUM BROMIDE 50 MG/5ML IV SOSY
PREFILLED_SYRINGE | INTRAVENOUS | Status: DC | PRN
  Administered 2017-02-19: 40 mg via INTRAVENOUS

## 2017-02-19 MED ORDER — SUGAMMADEX SODIUM 200 MG/2ML IV SOLN
INTRAVENOUS | Status: AC
  Filled 2017-02-19: qty 2

## 2017-02-19 MED ORDER — DEXAMETHASONE SODIUM PHOSPHATE 10 MG/ML IJ SOLN
INTRAMUSCULAR | Status: DC | PRN
  Administered 2017-02-19: 10 mg via INTRAVENOUS

## 2017-02-19 MED ORDER — CARVEDILOL 3.125 MG PO TABS
3.1250 mg | ORAL_TABLET | Freq: Two times a day (BID) | ORAL | Status: DC
  Administered 2017-02-20: 3.125 mg via ORAL
  Filled 2017-02-19: qty 1

## 2017-02-19 MED ORDER — ATORVASTATIN CALCIUM 20 MG PO TABS
20.0000 mg | ORAL_TABLET | Freq: Every day | ORAL | Status: DC
  Administered 2017-02-19: 20 mg via ORAL
  Filled 2017-02-19: qty 1

## 2017-02-19 MED ORDER — TRAMADOL HCL 50 MG PO TABS
100.0000 mg | ORAL_TABLET | Freq: Two times a day (BID) | ORAL | Status: DC | PRN
  Administered 2017-02-19 – 2017-02-20 (×2): 100 mg via ORAL
  Filled 2017-02-19 (×2): qty 2

## 2017-02-19 MED ORDER — HYDRALAZINE HCL 25 MG PO TABS
25.0000 mg | ORAL_TABLET | Freq: Three times a day (TID) | ORAL | Status: DC
  Administered 2017-02-20: 25 mg via ORAL
  Filled 2017-02-19: qty 1

## 2017-02-19 MED ORDER — DEXAMETHASONE SODIUM PHOSPHATE 10 MG/ML IJ SOLN
INTRAMUSCULAR | Status: AC
  Filled 2017-02-19: qty 1

## 2017-02-19 MED ORDER — EPHEDRINE 5 MG/ML INJ
INTRAVENOUS | Status: AC
  Filled 2017-02-19: qty 10

## 2017-02-19 MED ORDER — EPHEDRINE SULFATE 50 MG/ML IJ SOLN
INTRAMUSCULAR | Status: DC | PRN
  Administered 2017-02-19: 5 mg via INTRAVENOUS
  Administered 2017-02-19: 10 mg via INTRAVENOUS

## 2017-02-19 MED ORDER — MEMANTINE HCL 10 MG PO TABS
10.0000 mg | ORAL_TABLET | Freq: Two times a day (BID) | ORAL | Status: DC
  Administered 2017-02-20: 10 mg via ORAL
  Filled 2017-02-19: qty 1

## 2017-02-19 MED ORDER — ONDANSETRON HCL 4 MG/2ML IJ SOLN
INTRAMUSCULAR | Status: DC | PRN
  Administered 2017-02-19: 4 mg via INTRAVENOUS

## 2017-02-19 MED ORDER — FENTANYL CITRATE (PF) 100 MCG/2ML IJ SOLN
INTRAMUSCULAR | Status: DC | PRN
Start: 2017-02-19 — End: 2017-02-19
  Administered 2017-02-19 (×2): 50 ug via INTRAVENOUS

## 2017-02-19 MED ORDER — FAMOTIDINE 20 MG PO TABS
20.0000 mg | ORAL_TABLET | Freq: Two times a day (BID) | ORAL | Status: DC
  Administered 2017-02-19 – 2017-02-20 (×3): 20 mg via ORAL
  Filled 2017-02-19 (×3): qty 1

## 2017-02-19 SURGICAL SUPPLY — 52 items
ADH SKN CLS APL DERMABOND .7 (GAUZE/BANDAGES/DRESSINGS) ×1
BAG LAPAROSCOPIC 12 15 PORT 16 (BASKET) IMPLANT
BAG RETRIEVAL 12/15 (BASKET)
BAG SPEC RTRVL LRG 6X4 10 (ENDOMECHANICALS) ×2
CHLORAPREP W/TINT 26ML (MISCELLANEOUS) ×2 IMPLANT
COVER BACK TABLE 60X90IN (DRAPES) ×2 IMPLANT
COVER TIP SHEARS 8 DVNC (MISCELLANEOUS) ×1 IMPLANT
COVER TIP SHEARS 8MM DA VINCI (MISCELLANEOUS) ×1
DERMABOND ADVANCED (GAUZE/BANDAGES/DRESSINGS) ×1
DERMABOND ADVANCED .7 DNX12 (GAUZE/BANDAGES/DRESSINGS) ×1 IMPLANT
DRAPE ARM DVNC X/XI (DISPOSABLE) ×4 IMPLANT
DRAPE COLUMN DVNC XI (DISPOSABLE) ×1 IMPLANT
DRAPE DA VINCI XI ARM (DISPOSABLE) ×4
DRAPE DA VINCI XI COLUMN (DISPOSABLE) ×1
DRAPE SHEET LG 3/4 BI-LAMINATE (DRAPES) ×2 IMPLANT
DRAPE SURG IRRIG POUCH 19X23 (DRAPES) ×2 IMPLANT
ELECT REM PT RETURN 15FT ADLT (MISCELLANEOUS) ×2 IMPLANT
GLOVE BIO SURGEON STRL SZ 6 (GLOVE) ×8 IMPLANT
GLOVE BIO SURGEON STRL SZ 6.5 (GLOVE) ×4 IMPLANT
GOWN STRL REUS W/ TWL LRG LVL3 (GOWN DISPOSABLE) ×3 IMPLANT
GOWN STRL REUS W/TWL LRG LVL3 (GOWN DISPOSABLE) ×6
HOLDER FOLEY CATH W/STRAP (MISCELLANEOUS) ×2 IMPLANT
IRRIG SUCT STRYKERFLOW 2 WTIP (MISCELLANEOUS) ×2
IRRIGATION SUCT STRKRFLW 2 WTP (MISCELLANEOUS) ×1 IMPLANT
KIT BASIN OR (CUSTOM PROCEDURE TRAY) ×2 IMPLANT
MANIPULATOR UTERINE 4.5 ZUMI (MISCELLANEOUS) IMPLANT
MARKER SKIN DUAL TIP RULER LAB (MISCELLANEOUS) ×2 IMPLANT
OBTURATOR OPTICAL STANDARD 8MM (TROCAR) ×1
OBTURATOR OPTICAL STND 8 DVNC (TROCAR) ×1
OBTURATOR OPTICALSTD 8 DVNC (TROCAR) ×1 IMPLANT
OCCLUDER COLPOPNEUMO (BALLOONS) IMPLANT
PACK ROBOT GYN CUSTOM WL (TRAY / TRAY PROCEDURE) ×2 IMPLANT
PAD POSITIONING PINK XL (MISCELLANEOUS) ×2 IMPLANT
PORT ACCESS TROCAR AIRSEAL 12 (TROCAR) ×1 IMPLANT
PORT ACCESS TROCAR AIRSEAL 5M (TROCAR) ×1
POUCH SPECIMEN RETRIEVAL 10MM (ENDOMECHANICALS) ×2 IMPLANT
SEAL CANN UNIV 5-8 DVNC XI (MISCELLANEOUS) ×4 IMPLANT
SEAL XI 5MM-8MM UNIVERSAL (MISCELLANEOUS) ×4
SET TRI-LUMEN FLTR TB AIRSEAL (TUBING) ×1 IMPLANT
SHEET LAVH (DRAPES) ×1 IMPLANT
SOLUTION ELECTROLUBE (MISCELLANEOUS) ×2 IMPLANT
SUT MNCRL AB 4-0 PS2 18 (SUTURE) ×4 IMPLANT
SUT VIC AB 0 CT1 27 (SUTURE)
SUT VIC AB 0 CT1 27XBRD ANTBC (SUTURE) IMPLANT
SYR 50ML LL SCALE MARK (SYRINGE) IMPLANT
TOWEL OR 17X26 10 PK STRL BLUE (TOWEL DISPOSABLE) ×2 IMPLANT
TOWEL OR NON WOVEN STRL DISP B (DISPOSABLE) ×2 IMPLANT
TRAP SPECIMEN MUCOUS 40CC (MISCELLANEOUS) ×1 IMPLANT
TRAY FOLEY CATH SILVER 16FR LF (SET/KITS/TRAYS/PACK) ×1 IMPLANT
TROCAR BLADELESS OPT 5 100 (ENDOMECHANICALS) ×2 IMPLANT
UNDERPAD 30X30 (UNDERPADS AND DIAPERS) ×2 IMPLANT
WATER STERILE IRR 1000ML POUR (IV SOLUTION) ×2 IMPLANT

## 2017-02-19 NOTE — Progress Notes (Signed)
[facility] contacted CSW to report possible respite bed available tomorrow for pt. Facility will contact family to discuss details. CSW will contact [place] in the am to assist with d/c planning.  Werner Lean LCSW [phone]

## 2017-02-19 NOTE — H&P (View-Only) (Signed)
Consult Note: Gyn-Onc   [name] 83 y.o. female  Chief Complaint  Patient presents with  . Ovarian cyst of right Ovary    Assessment New-onset of intermittent left lower quadrant pain possibly representing intermittent ovarian torsion.  :Left ovarian cyst documented on serial imaging since December 2014. Normal CA-125 values.  Hx of CAD and atrial fibrillation - cardiac clearance for surgery obtained  Plan: I met with the patient and her son to review her ultrasound and current situation. Given that the pain score is 5 out of 10 and represents approximately 30% of her day I would recommend the ovarian cyst be removed. Hopefully we can do this laparoscopically or robotically. However, given that she has had multiple prior laparotomies ( cesarean sections, hysterectomy, appendectomy, open chole, open umbilical hernia (possibly with mesh)) she is likely to have significant adhesive disease. I discussed with the patient that this places her at higher surgical risk, and raised the likelihood that she will need laparotomy.   I discussed risks of surgery including  bleeding, infection, damage to internal organs (such as bladder,ureters, bowels), blood clot, reoperation and rehospitalization. I discussed anticipated postop healing and recovery - I recommended she look into assisted living assistance immediately postop.  She will hold her coumadin for 7 days preop. Bridge will not be introduced as she is on this for a fib without heart failure. I will restart postop if there are no signs of bleeding.  The patient expressed understanding that the risks (particularly GI injury due to scar tissue, bleeding risk due to coumadin use) are higher for her given her medical history and advanced age. However, due to her symptoms, she feels strongly in pursuing surgery.  Interval history: The patient presents today with symptoms of persistent pains and an [place] on 02/04/17 showing a left ovary measuring  4.9x3.4x4.4cm (slightly smaller than previous scan). No septations or mural nodules). She has seen her cardiologist and received "clearance" for surgery.  HPI: 83 year old white widowed female para 1 seen in consultation request of Dr. [name] regarding management of a left ovarian cyst. Review the patient's records reveals that she has had serial imaging since December 2014. At that time she was found to have a simple cyst measuring 3.7 x 3.7 x 3.0 cm. In November 2016 and ultrasound showed the cyst measuring 4.6 x 3.6 x 4.5 cm. There is no nodularity or free fluid. MRI on 08/04/2015 reaffirmed the ultrasound findings. On 08/06/2016 the patient had another ultrasound showing slight increased size and the cyst now measuring 5.2 x 4.0 x 4.6 cm. There was a 1.4 cm nodule. Recent CA-125 was 9 units per mL and a year prior was 11 units per mL.  The patient is entirely asymptomatic. She denies any pelvic pain or pressure or any other GI or GU symptoms.  She has a past history of having an abdominal hysterectomy and uterine suspension.  Review of Systems:10 point review of systems is negative except as noted in interval history.   Vitals: Blood pressure (!) 154/68, pulse 73, temperature 97.6 F (36.4 C), resp. rate 20, SpO2 100 %.  Physical Exam: General : The patient is a healthy woman in no acute distress.  HEENT: normocephalic, extraoccular movements normal; neck is supple without thyromegally  Lynphnodes: Supraclavicular and inguinal nodes not enlarged  Abdomen: Soft, non-tender, no ascites, no organomegally, no masses, no hernias , she has a midline incision as well as a subcostal incision and a incision at McBurney's point.  Pelvic  exam:  EGBUS normal  Vagina is atrophic. Cervix and uterus surgically absent  Bimanual exam reveals some fullness in the left adnexa with some slight tenderness. There is no rebound.   Lower extremities: No edema or varicosities. Normal range of motion       Allergies  Allergen Reactions  . Avelox [Moxifloxacin Hcl In Nacl] Shortness Of Breath and Swelling  . Moxifloxacin Other (See Comments), Shortness Of Breath and Swelling    other  . Aricept [Donepezil Hcl] Diarrhea  . Codeine Swelling       . Donepezil Diarrhea  . Garlic Other (See Comments)    Gi problems  . Latex Hives, Itching, Rash and Other (See Comments)    Watery blisters   . Onion Other (See Comments)    Gi problems  . Sulfa Drugs Cross Reactors Swelling  . Other Other (See Comments)    Her throat closes up  . Duloxetine Rash  . Polysporin [Bacitracin-Polymyxin B] Other (See Comments)    other    Past Medical History:  Diagnosis Date  . Abnormality of gait 09/07/2015  . Anemia, iron deficiency 09/16/2014  . Anxiety   . CAD (coronary artery disease)    a. 08/2014 NSTEMI/Cath: mild Ca2+ or RCA ostium, otw nl cors. CO 3.3 L/min (thermo), 3.7 L/min (Fick).  . Carotid arterial disease ([place]) 07/30/2012   carotid doppler; normal study  . Chronic anticoagulation, with coumadin, with PAF and CHADS2Vasc2 score of 4 09/16/2014  . Degenerative arthritis   . Diverticulosis   . Essential hypertension   . Foot fracture, left   . GERD (gastroesophageal reflux disease)   . History of nuclear stress test 09/19/2007   normal pattern of perfusion; post-stress EF 86%; EKG negative for ischemia; low risk scan  . Hyperlipidemia   . Left carotid bruit    a. 07/2015 Carotid U/S: 1-39% bilat ICA stenosis.  . Memory disorder 03/05/2014  . Mild renal insufficiency   . Mitral prolapse   . Neuropathy   . PAF (paroxysmal atrial fibrillation) (H[facility])    a. 08/2014-->Coumadin (CHA2DS2VASc = 4).  . Peripheral neuropathy    Small fiber   . Pre-syncope    a. 04/2015 in setting of bradycardia-->CCB/BB doses adjusted.  . Pulmonary hypertension ([place])   . Valvular heart disease    a. 04/2015 Echo: EF 65-70%, no rwma, Gr1 DD, mild AS, triv AI, mild MS, mod MR, mod dil LA, mod TR, sev increased  PASP.    Past Surgical History:  Procedure Laterality Date  .  Bilateral bunionectomies    . ABDOMINAL HYSTERECTOMY    . APPENDECTOMY    . DILATION AND CURETTAGE OF UTERUS    . FOOT FRACTURE SURGERY Left   . gallbladder resection    . LEFT HEART CATHETERIZATION WITH CORONARY ANGIOGRAM N/A 09/14/2014   Procedure: LEFT HEART CATHETERIZATION WITH CORONARY ANGIOGRAM;  Surgeon: Troy Sine, MD;  Location: [facility] CATH LAB;  Service: Cardiovascular;  Laterality: N/A;  . REPLACEMENT TOTAL KNEE Right   . resuspension procedure    . TEAR DUCT PROBING WITH STRABISMUS REPAIR     Tear duct repair surgery  . TONSILLECTOMY    . UMBILICAL HERNIA REPAIR      Current Outpatient Prescriptions  Medication Sig Dispense Refill  . acetaminophen (TYLENOL) 500 MG tablet Take 500 mg by mouth.    Marland Kitchen atorvastatin (LIPITOR) 20 MG tablet Take 1 tablet (20 mg total) by mouth at bedtime. 90 tablet 2  . carvedilol (COREG) 3.125 MG  tablet TAKE 1 TABLET BY MOUTH TWICE A DAY 180 tablet 0  . cetirizine (ZYRTEC) 10 MG tablet Take 10 mg by mouth.    . cholecalciferol (VITAMIN D) 1000 UNITS tablet Take 1,000 Units by mouth daily.      . colestipol (COLESTID) 1 G tablet Take 1 g by mouth 3 (three) times daily. For diarrhea and does not take at the same time as coumadin    . Cyanocobalamin (RA VITAMIN B-12 TR) 1000 MCG TBCR Take by mouth.    . cycloSPORINE (RESTASIS) 0.05 % ophthalmic emulsion Place 1 drop into both eyes daily.      Marland Kitchen diltiazem (CARDIZEM SR) 60 MG 12 hr capsule Take 1 capsule (60 mg total) by mouth every 12 (twelve) hours. 180 capsule 1  . fenofibrate micronized (LOFIBRA) 134 MG capsule TAKE ONE CAPSULE BY MOUTH ONCE DAILY 90 capsule 2  . gabapentin (NEURONTIN) 300 MG capsule TAKE 1 CAPSULE THREE TIMES DAILY AND 3 CAPSULES AT BEDTIME 540 capsule 1  . hydrALAZINE (APRESOLINE) 25 MG tablet Take 1 tablet (25 mg total) by mouth 3 (three) times daily. 270 tablet 3  . hydrochlorothiazide (HYDRODIURIL) 25 MG tablet  TAKE 1 TABLET EVERY DAY 90 tablet 3  . ipratropium (ATROVENT) 0.06 % nasal spray Place 1 spray into both nostrils as directed.     . Lactobacillus (ACIDOPHILUS) 100 MG CAPS Take 100 mg by mouth daily.     Marland Kitchen loperamide (IMODIUM) 2 MG capsule Take 1 capsule (2 mg total) by mouth 2 (two) times daily as needed for diarrhea or loose stools. 30 capsule 0  . memantine (NAMENDA) 10 MG tablet Take 1 tablet (10 mg total) by mouth 2 (two) times daily. 180 tablet 3  . Multiple Vitamin (MULTIVITAMIN) tablet Take 1 tablet by mouth daily.      . Multiple Vitamins-Minerals (VISION FORMULA/LUTEIN) TABS Take 1 tablet by mouth daily.    . Omega-3 Fatty Acids (FISH OIL PO) Take by mouth daily.    . pantoprazole (PROTONIX) 40 MG tablet Take 40 mg by mouth daily.     Marland Kitchen pyridOXINE (VITAMIN B-6) 100 MG tablet Take 100 mg by mouth daily.    . ranitidine (ZANTAC) 150 MG tablet     . simethicone (MYLICON) 846 MG chewable tablet Chew 125 mg by mouth as needed for flatulence.    . warfarin (COUMADIN) 2 MG tablet Take 1.5 to 2 tablets by mouth daily as directed by coumadin clinic 60 tablet 3   No current facility-administered medications for this visit.     Social History   Social History  . Marital status: Widowed    Spouse name: N/A  . Number of children: 2  . Years of education: AS   Occupational History  . Retired    Social History Main Topics  . Smoking status: Former Research scientist (life sciences)  . Smokeless tobacco: Never Used  . Alcohol use No  . Drug use: No  . Sexual activity: No   Other Topics Concern  . Not on file   Social History Narrative   Resides at [facility]   Patient is left handed, but uses right handed.   Patient drinks one cup caffeine daily.       Family History  Problem Relation Age of Onset  . Pneumonia Mother   . Coronary artery disease Mother   . Hypertension Mother   . Heart disease Father   . Cancer Father   . Hypertension Father   . Arthritis Sister  [name], MD 02/11/2017, 5:44 PM

## 2017-02-19 NOTE — Interval H&P Note (Signed)
History and Physical Interval Note:  02/19/2017 7:09 AM  [name]  has presented today for surgery, with the diagnosis of left ovarian cyst  The various methods of treatment have been discussed with the patient and family. After consideration of risks, benefits and other options for treatment, the patient has consented to  Procedure(s): XI ROBOTIC [place] (Bilateral) as a surgical intervention .  The patient's history has been reviewed, patient examined, no change in status, stable for surgery.  I have reviewed the patient's chart and labs.  Questions were answered to the patient's satisfaction.     [name]

## 2017-02-19 NOTE — Anesthesia Procedure Notes (Signed)
Procedure Name: Intubation Date/Time: 02/19/2017 8:24 AM Performed by: Dione Booze Pre-anesthesia Checklist: Suction available, Patient being monitored, Emergency Drugs available and Patient identified Patient Re-evaluated:Patient Re-evaluated prior to inductionOxygen Delivery Method: Circle system utilized Preoxygenation: Pre-oxygenation with 100% oxygen Intubation Type: IV induction Ventilation: Mask ventilation without difficulty Laryngoscope Size: Mac and 4 Grade View: Grade II Tube type: Oral Tube size: 7.5 mm Number of attempts: 1 Airway Equipment and Method: Stylet Placement Confirmation: ETT inserted through vocal cords under direct vision,  positive ETCO2 and breath sounds checked- equal and bilateral Secured at: 21 cm Tube secured with: Tape Dental Injury: Teeth and Oropharynx as per pre-operative assessment  Comments: No change in dentition

## 2017-02-19 NOTE — Anesthesia Postprocedure Evaluation (Signed)
Anesthesia Post Note  Patient: [name]  Procedure(s) Performed: Procedure(s) (LRB): XI ROBOTIC ASSISTED BILATERAL SALPINGO OOPHORECTOMY AND LYSIS OF ADHESION (Bilateral)     Patient location during evaluation: PACU Anesthesia Type: General Level of consciousness: awake and alert Pain management: pain level controlled Vital Signs Assessment: post-procedure vital signs reviewed and stable Respiratory status: spontaneous breathing, nonlabored ventilation, respiratory function stable and patient connected to nasal cannula oxygen Cardiovascular status: blood pressure returned to baseline and stable Postop Assessment: no signs of nausea or vomiting Anesthetic complications: no    Last Vitals:  Vitals:   02/19/17 1015 02/19/17 1030  BP: 124/78 115/68  Pulse: 60 61  Resp: 11 10  Temp: (!) 35.8 C (!) 35.9 C    Last Pain:  Vitals:   02/19/17 1030  TempSrc:   PainSc: 4                  Adekunle Rohrbach A.

## 2017-02-19 NOTE — Plan of Care (Signed)
Problem: Respiratory: Goal: Ability to achieve and maintain a regular respiratory rate will improve Outcome: Completed/Met Date Met: 02/19/17 Incentive spirometer achieved 1500

## 2017-02-19 NOTE — Transfer of Care (Signed)
Immediate Anesthesia Transfer of Care Note  Patient: [name]  Procedure(s) Performed: Procedure(s): XI ROBOTIC ASSISTED BILATERAL SALPINGO OOPHORECTOMY AND LYSIS OF ADHESION (Bilateral)  Patient Location: PACU  Anesthesia Type:General  Level of Consciousness: awake, alert  and patient cooperative  Airway & Oxygen Therapy: Patient Spontanous Breathing and Patient connected to face mask oxygen  Post-op Assessment: Report given to RN and Post -op Vital signs reviewed and stable  Post vital signs: Reviewed and stable  Last Vitals:  Vitals:   02/19/17 0632  BP: (!) 163/60  Pulse: 63  Resp: 16  Temp: 36.5 C    Last Pain:  Vitals:   02/19/17 0705  TempSrc:   PainSc: 4       Patients Stated Pain Goal: 4 (65/53/74 8270)  Complications: No apparent anesthesia complications

## 2017-02-19 NOTE — Op Note (Signed)
OPERATIVE NOTE  Date: 02/19/17  Preoperative Diagnosis: left ovarian cyst   Postoperative Diagnosis:  same  Procedure(s) Performed: Robotic-assisted laparoscopic bilateral salpingo-oophorectomy  Surgeon: Everitt Amber, M.D.  Assistant Surgeon: Lahoma Crocker M.D. (an MD assistant was necessary for tissue manipulation, management of robotic instrumentation, retraction and positioning due to the complexity of the case and hospital policies).   Anesthesia: Gen. endotracheal.  Specimens: Bilateral ovaries, fallopian tubes, pelvic washings  Estimated Blood Loss: 20 mL. Blood Replacement: None  Complications: none  Indication for Procedure:  Pelvic pain, left ovarian cyst  Operative Findings: 6cm left ovarian cyst, normal appearing right ovary. Adhesions between omentum and midline upper abdomen at site of hernia mesh.   [place] pathology was consistent with benign ovarian cyst  Procedure: The patient's taken to the operating room and placed under general endotracheal anesthesia testing difficulty. She is placed in a dorsolithotomy position and cervical acromial pad was placed. The arms were tucked with care taken to pad the olecranon process. And prepped and draped in usual sterile fashion. A uterine manipulator (zumi) was placed vaginally. A 44mm incision was made in the left upper quadrant palmer's point and a 5 mm Optiview trocar used to enter the abdomen under direct visualization. With entry into the abdomen and then maintenance of 15 mm of mercury the patient was placed in Trendelenburg position. An incision was made in the umbilicus and a 50KX trochar was placed through this site. Two incisions were made lateral to the umbilical incision in the left and right abdomen measuring 10mm. These incisions were made approximately 10 cm lateral to the umbilical incision. 8 mm robotic trochars were inserted. The robot was docked.  The abdomen was inspected as was the pelvis.  Pelvic washings were  obtained. An incision was made on the right pelvic side wall peritoneum parallel to the IP ligament and the retroperitoneal space entered. The right ureter was identified and the para-rectal space was developed. A window was created in the right broad ligament above the ureter. The right infundibulopelvic vessels were skeletonized cauterized and transected. The utero-ovarian ligaments similarly were cauterized and transected. Specimen was placed in an Endo Catch bag.  In a similar manner the left peritoneum and the side wall was incised, and the retroperitoneal space entered. The left ureter was identified and the left pararectal space was developed. The utero-ovarian ligament was skeletonized cauterized and transected. The left utero-ovarian ligaments were cauterized and transected in the left adnexa was placed in an Endo Catch bag.  The abdomen was copiously irrigated and drained and all operative sites inspected and hemostasis was assured  The robot was undocked. The camera was placed through the left upper abdominal incision. The contents of the left Endo Catch bag were first aspirated and then morcellated to facilitate removal from the abdominal cavity through the umbilical incision. In a similar fashion the contents of the right Endo Catch bag or morcellated to facilitate removal from the abdominal cavity.  The ports were all remove. The fascial closure at the umbilical incision and left upper quadrant port was made with 0 Vicryl.  All incisions were closed with a running subcuticular Monocryl suture. Dermabond was applied. Sponge, lap and needle counts were correct x 3.    The patient had sequential compression devices for VTE prophylaxis.         Disposition: PACU -stable         Condition: stable  [name], MD

## 2017-02-19 NOTE — Discharge Instructions (Addendum)
02/19/2017  Activity: 1. Be up and out of the bed during the day.  Take a nap if needed.  You may walk up steps but be careful and use the hand rail.  Stair climbing will tire you more than you think, you may need to stop part way and rest.   2. No lifting or straining for 6 weeks.  3. No driving for 1 week(s) if you were cleared to drive before surgery.  Do not drive if you are taking narcotic pain medicine.  4. Shower daily.  Use soap and water on your incision and pat dry; don't rub.  No tub baths until cleared by your surgeon.   5. No sexual activity and nothing in the vagina for 3-4 weeks.  6. You may experience a small amount of clear drainage from your incisions, which is normal.  If the drainage persists or increases, please call the office.  Diet: 1. Low sodium Heart Healthy Diet is recommended.  2. It is safe to use a laxative, such as Miralax or Colace, if you have difficulty moving your bowels. You can take sennakot every evening for your bowels as well.  Wound Care: 1. Keep clean and dry.  Shower daily.  Reasons to call the Doctor:  Fever - Oral temperature greater than 100.4 degrees Fahrenheit  Difficulty urinating  Nausea and vomiting  Increased pain at the site of the incision that is unrelieved with pain medicine.  Difficulty breathing with or without chest pain  New calf pain especially if only on one side  Sudden, continuing increased vaginal bleeding with or without clots.   Medications  Medications:  - Take ibuprofen and tylenol first line for pain control. Take these regularly (every 6 hours) to decrease the build up of pain. - you can restart coumadin on 02/20/17 at your usual dose.  Contacts: For questions or concerns you should contact:  Dr. Everitt Amber at [phone]  Joylene John, NP at [phone]  After Hours: call [phone] and have the GYN Oncologist paged/contacted   Bilateral Salpingo-Oophorectomy, Care After This sheet gives  you information about how to care for yourself after your procedure. Your health care provider may also give you more specific instructions. If you have problems or questions, contact your health care provider. What can I expect after the procedure? After the procedure, it is common to have:  Abdominal pain.  Some occasional vaginal bleeding (spotting).  Tiredness.  Symptoms of menopause, such as hot flashes, night sweats, or mood swings.  Follow these instructions at home: Incision care  Keep your incision area and your bandage (dressing) clean and dry.  Follow instructions from your health care provider about how to take care of your incision. Make sure you: ? Wash your hands with soap and water before you change your dressing. If soap and water are not available, use hand sanitizer. ? Change your dressing as told by your health care provider. ? Leave stitches (sutures), staples, skin glue, or adhesive strips in place. These skin closures may need to stay in place for 2 weeks or longer. If adhesive strip edges start to loosen and curl up, you may trim the loose edges. Do not remove adhesive strips completely unless your health care provider tells you to do that.  Check your incision area every day for signs of infection. Check for: ? Redness, swelling, or pain. ? Fluid or blood. ? Warmth. ? Pus or a bad smell. Activity  Do not drive or use heavy machinery  while taking prescription pain medicine.  Do not drive for 24 hours if you received a medicine to help you relax (sedative) during your procedure.  Take frequent, short walks throughout the day. Rest when you get tired. Ask your health care provider what activities are safe for you.  Avoid activity that requires great effort. Also, avoid heavy lifting. Do not lift anything that is heavier than 10 lbs. (4.5 kg), or the limit that your health care provider tells you, until he or she says that it is safe to do so.  Do not douche,  use tampons, or have sex until your health care provider approves. General instructions  To prevent or treat constipation while you are taking prescription pain medicine, your health care provider may recommend that you: ? Drink enough fluid to keep your urine clear or pale yellow. ? Take over-the-counter or prescription medicines. ? Eat foods that are high in fiber, such as fresh fruits and vegetables, whole grains, and beans. ? Limit foods that are high in fat and processed sugars, such as fried and sweet foods.  Take over-the-counter and prescription medicines only as told by your health care provider.  Do not take baths, swim, or use a hot tub until your health care provider approves. Ask your health care provider if you can take showers. You may only be allowed to take sponge baths for bathing.  Wear compression stockings as told by your health care provider. These stockings help to prevent blood clots and reduce swelling in your legs.  Keep all follow-up visits as told by your health care provider. This is important. Contact a health care provider if:  You have pain when you urinate.  You have pus or a bad smelling discharge coming from your vagina.  You have redness, swelling, or pain around your incision.  You have fluid or blood coming from your incision.  Your incision feels warm to the touch.  You have pus or a bad smell coming from your incision.  You have a fever.  Your incision starts to break open.  You have pain in the abdomen, and it gets worse or does not get better when you take medicine.  You develop a rash.  You develop nausea and vomiting.  You feel lightheaded. Get help right away if:  You develop pain in your chest or leg.  You become short of breath.  You faint.  You have increased bleeding from your vagina. Summary  After the procedure, it is common to have pain, bleeding in the vagina, and symptoms of menopause.  Follow instructions  from your health care provider about how to take care of your incision.  Follow instructions from your health care provider about activities and restrictions.  Check your incision every day for signs of infection and report any symptoms to your health care provider. This information is not intended to replace advice given to you by your health care provider. Make sure you discuss any questions you have with your health care provider. Document Released: 08/06/2005 Document Revised: 09/10/2016 Document Reviewed: 09/10/2016 Elsevier Interactive Patient Education  [employer].

## 2017-02-19 NOTE — H&P (Signed)
CSW contacted by Lenna Sciara ( GYN oncology ) to assist with d/c planning. Pt is from [place]. Pt has [employer]. In order to use medicare for ST Rehab placement at [facility] pt will need to qualify for in pt status and need hospitalization for 3 over nights and PT eval recommending SNF placement. CSW has left VM for Admissions at [facility] to check bed availability to see if pt has the option of paying out of pocket for SNF placement.  Awaiting call back.  Werner Lean LCSW [phone]

## 2017-02-20 DIAGNOSIS — Z7901 Long term (current) use of anticoagulants: Secondary | ICD-10-CM | POA: Diagnosis not present

## 2017-02-20 DIAGNOSIS — I10 Essential (primary) hypertension: Secondary | ICD-10-CM | POA: Diagnosis not present

## 2017-02-20 DIAGNOSIS — I252 Old myocardial infarction: Secondary | ICD-10-CM | POA: Diagnosis not present

## 2017-02-20 DIAGNOSIS — D271 Benign neoplasm of left ovary: Secondary | ICD-10-CM | POA: Diagnosis not present

## 2017-02-20 DIAGNOSIS — I2510 Atherosclerotic heart disease of native coronary artery without angina pectoris: Secondary | ICD-10-CM | POA: Diagnosis not present

## 2017-02-20 DIAGNOSIS — I480 Paroxysmal atrial fibrillation: Secondary | ICD-10-CM | POA: Diagnosis not present

## 2017-02-20 MED ORDER — TRAMADOL HCL 50 MG PO TABS: 50.0000 mg | ORAL_TABLET | Freq: Two times a day (BID) | ORAL | 0 refills | Status: DC | PRN

## 2017-02-20 NOTE — Discharge Summary (Signed)
Physician Discharge Summary  Patient ID: [name] MRN: [id number] DOB/AGE: [birth year] 81 y.o.  Admit date: 02/19/2017 Discharge date: 02/20/2017  Admission Diagnoses: Pelvic mass in female  Discharge Diagnoses:  Principal Problem:   Pelvic mass in female   Discharged Condition: good  Hospital Course: On 02/19/2017, the patient underwent the following: Procedure(s): XI ROBOTIC ASSISTED BILATERAL SALPINGO OOPHORECTOMY AND LYSIS OF ADHESION.   The postoperative course was uneventful.  She was discharged to home on postoperative day 1 tolerating a regular diet.  Consults: None  Significant Diagnostic Studies: None  Treatments: surgery: see above  Discharge Exam: Blood pressure (!) 110/56, pulse 74, temperature 97.7 F (36.5 C), temperature source Oral, resp. rate 18, height 5' 2.5" (1.588 m), weight 116 lb 4.8 oz (52.8 kg), SpO2 100 %. General appearance: alert Resp: clear to auscultation bilaterally Cardio: regular rate and rhythm, S1, S2 normal, no murmur, click, rub or gallop GI: soft, non-tender; bowel sounds normal; no masses,  no organomegaly Extremities: extremities normal, atraumatic, no cyanosis or edema Incision/Wound: C/D/I  Disposition: 01-Home or Self Care  Discharge Instructions    Call MD for:  extreme fatigue    Complete by:  As directed    Call MD for:  persistant dizziness or light-headedness    Complete by:  As directed    Call MD for:  persistant nausea and vomiting    Complete by:  As directed    Call MD for:  redness, tenderness, or signs of infection (pain, swelling, redness, odor or green/yellow discharge around incision site)    Complete by:  As directed    Call MD for:  severe uncontrolled pain    Complete by:  As directed    Call MD for:  temperature >100.4    Complete by:  As directed    Diet - low sodium heart healthy    Complete by:  As directed    Diet general    Complete by:  As directed    Discharge wound care:    Complete by:   As directed    Keep clean and dry   Driving Restrictions    Complete by:  As directed    No driving for 1- 2 weeks   Increase activity slowly    Complete by:  As directed    Lifting restrictions    Complete by:  As directed    No lifting > 5 lbs for 6 weeks   May shower / Bathe    Complete by:  As directed    No tub baths for 6 weeks   Sexual Activity Restrictions    Complete by:  As directed    No intercourse for 6 - 8 weeks     Allergies as of 02/20/2017      Reactions   Avelox [moxifloxacin Hcl In Nacl] Shortness Of Breath, Swelling   Moxifloxacin Other (See Comments), Shortness Of Breath, Swelling   other   Aricept [donepezil Hcl] Diarrhea   Codeine Swelling      Donepezil Diarrhea   Garlic Other (See Comments)   Gi problems   Latex Hives, Itching, Rash, Other (See Comments)   Watery blisters    Onion Other (See Comments)   Gi problems   Sulfa Drugs Cross Reactors Swelling   Other Other (See Comments)   Her throat closes up   Duloxetine Rash   Polysporin [bacitracin-polymyxin B] Other (See Comments)   other      Medication List    TAKE these  medications   acetaminophen 500 MG tablet Commonly known as:  TYLENOL Take 500 mg by mouth every 6 (six) hours as needed for moderate pain.   atorvastatin 20 MG tablet Commonly known as:  LIPITOR Take 1 tablet (20 mg total) by mouth at bedtime.   AVEENO ADVANCED CARE EX Apply 1 application topically 2 (two) times daily.   CALCIUM 600+D 600-800 MG-UNIT Tabs Generic drug:  Calcium Carb-Cholecalciferol Take 1 tablet by mouth daily.   carvedilol 3.125 MG tablet Commonly known as:  COREG TAKE 1 TABLET BY MOUTH TWICE A DAY   cetirizine 10 MG tablet Commonly known as:  ZYRTEC Take 10 mg by mouth daily with breakfast.   cholecalciferol 1000 units tablet Commonly known as:  VITAMIN D Take 1,000 Units by mouth daily.   colestipol 1 g tablet Commonly known as:  COLESTID Take 1 g by mouth 3 (three) times daily. For  diarrhea and does not take at the same time as coumadin   cycloSPORINE 0.05 % ophthalmic emulsion Commonly known as:  RESTASIS Place 1 drop into both eyes 2 (two) times daily.   diltiazem 60 MG 12 hr capsule Commonly known as:  CARDIZEM SR Take 1 capsule (60 mg total) by mouth every 12 (twelve) hours.   fenofibrate micronized 134 MG capsule Commonly known as:  LOFIBRA TAKE ONE CAPSULE BY MOUTH ONCE DAILY   Flax Seed Oil 1000 MG Caps Take 1 capsule by mouth 3 (three) times daily.   fluticasone 50 MCG/ACT nasal spray Commonly known as:  FLONASE Place 2 sprays into both nostrils 2 (two) times daily.   gabapentin 300 MG capsule Commonly known as:  NEURONTIN TAKE 1 CAPSULE THREE TIMES DAILY AND 3 CAPSULES AT BEDTIME What changed:  See the new instructions.   hydrALAZINE 25 MG tablet Commonly known as:  APRESOLINE Take 1 tablet (25 mg total) by mouth 3 (three) times daily.   hydrochlorothiazide 25 MG tablet Commonly known as:  HYDRODIURIL TAKE 1 TABLET EVERY DAY   LACTOBACILLUS RHAMNOSUS (GG) PO Take 1 capsule by mouth daily.   loperamide 2 MG tablet Commonly known as:  IMODIUM A-D Take 2 mg by mouth daily with breakfast.   memantine 10 MG tablet Commonly known as:  NAMENDA Take 1 tablet (10 mg total) by mouth 2 (two) times daily.   multivitamin tablet Take 1 tablet by mouth daily.   OVER THE COUNTER MEDICATION Take 1 capsule by mouth 3 (three) times daily. *IBgard*   pantoprazole 40 MG tablet Commonly known as:  PROTONIX Take 40 mg by mouth daily.   PEPPERMINT OIL PO Take 1 capsule by mouth 2 (two) times daily. IB guard   pyridOXINE 100 MG tablet Commonly known as:  VITAMIN B-6 Take 100 mg by mouth daily.   ranitidine 150 MG tablet Commonly known as:  ZANTAC Take 150 mg by mouth 2 (two) times daily.   simethicone 125 MG chewable tablet Commonly known as:  MYLICON Chew 563 mg by mouth as needed for flatulence.   traMADol 50 MG tablet Commonly known as:   ULTRAM Take 1 tablet (50 mg total) by mouth every 12 (twelve) hours as needed.   vitamin B-12 1000 MCG tablet Commonly known as:  CYANOCOBALAMIN Take 1,000 mcg by mouth daily.   warfarin 2 MG tablet Commonly known as:  COUMADIN Take 1.5 to 2 tablets by mouth daily as directed by coumadin clinic What changed:  how much to take  how to take this  when to take this  additional instructions      Follow-up Information    Everitt Amber, MD. Schedule an appointment as soon as possible for a visit in 3 week(s).   Specialty:  Obstetrics and Gynecology Contact information: [place] [address] [phone]           Signed: Agnes Lawrence 02/20/2017, 9:53 AM

## 2017-02-20 NOTE — Progress Notes (Signed)
CSW confirmed patient liaison pt. has a respite bed at [facility]. She reports the process is similar to patient discharging home. She will receive all her paperwork and then contact [name].  CSW met with patient and son at bedside, patient son already informed of process and in contact with [name].  No other needs presented at this time.  CSW inform RN. CSW signing off.   [name], [name], MSW Clinical Social Worker 5E and Psychiatric Service Line [phone] 02/20/2017  10:21 AM

## 2017-02-21 ENCOUNTER — Telehealth: Payer: Self-pay | Admitting: Physician Assistant

## 2017-02-21 DIAGNOSIS — I4891 Unspecified atrial fibrillation: Secondary | ICD-10-CM | POA: Diagnosis not present

## 2017-02-21 LAB — PROTIME-INR: INR: 1.1 (ref 0.9–1.1)

## 2017-02-21 NOTE — Telephone Encounter (Signed)
Called from [facility] about INR of 1.1 today. She was off coumadin 6/25 to 7/3 for surgery. She took 3mg  yesterday. INR 1.1 today. Joelene Millin at [facility] hasn't received call from coumadin clinic. Advised to take 4mg  tonight and call pharmacy tomorrow.

## 2017-02-22 ENCOUNTER — Ambulatory Visit (INDEPENDENT_AMBULATORY_CARE_PROVIDER_SITE_OTHER): Payer: Medicare Other | Admitting: Pharmacist

## 2017-02-22 DIAGNOSIS — I480 Paroxysmal atrial fibrillation: Secondary | ICD-10-CM

## 2017-02-22 NOTE — Telephone Encounter (Signed)
Called Ms. [name]. No fax received yet with INR results. Will talked to [name] today for further dosing.

## 2017-02-22 NOTE — Telephone Encounter (Signed)
Talked to [name] today 02/22/2017 (see anti-coagulation encounter).

## 2017-02-26 ENCOUNTER — Encounter: Payer: Self-pay | Admitting: Gynecologic Oncology

## 2017-02-26 ENCOUNTER — Ambulatory Visit: Payer: Medicare Other | Admitting: Gynecologic Oncology

## 2017-02-26 NOTE — Progress Notes (Signed)
Patient to the office for evaluation of her upper left abd incision.  The patient's son called yesterday stating the RN at the [place] facility where the patient lives stated she felt the incision may be infected.  Today he states the RN stated the skin edges were not together.    Patient alert, oriented, in no distress.  Lungs clear, HR regular rate and rhythm with mild systolic murmur noted, abdomen soft, nondistended.  Incisions healing without evidence of separation or infection.  Lap site in the left upper abdomen falls within a skin fold.  No evidence of infection or separation.  Dermabond glue has loosen on the upper aspect of the incision but remains intact on the lower portion.  2x2 dry gauze placed over the incision to offer extra cushion and protection to the incision.  Discussed care with the patient and son.  She denies having any further episodes of severe pain that she was experiencing pre-op.  She does report mild increase in shortness of breath since surgery but states it was present before as well.  Denies cough, fever, chills.  Vitals 100% on RA, R20, T97.5, BP 146/73, HR 66.    Reportable signs and symptoms reviewed.  Final path discussed and given to the patient.  Follow up appt reinforced.  Advised to call for any needs, concerns, or if the incision posed a concern to her or the RN at the facility.

## 2017-02-28 ENCOUNTER — Ambulatory Visit (INDEPENDENT_AMBULATORY_CARE_PROVIDER_SITE_OTHER): Payer: Self-pay | Admitting: Pharmacist

## 2017-02-28 DIAGNOSIS — I480 Paroxysmal atrial fibrillation: Secondary | ICD-10-CM

## 2017-02-28 DIAGNOSIS — D689 Coagulation defect, unspecified: Secondary | ICD-10-CM | POA: Diagnosis not present

## 2017-02-28 DIAGNOSIS — I5042 Chronic combined systolic (congestive) and diastolic (congestive) heart failure: Secondary | ICD-10-CM | POA: Diagnosis not present

## 2017-02-28 LAB — PROTIME-INR: INR: 1.6 — AB (ref 0.9–1.1)

## 2017-03-07 ENCOUNTER — Ambulatory Visit (INDEPENDENT_AMBULATORY_CARE_PROVIDER_SITE_OTHER): Payer: Medicare Other | Admitting: Pharmacist Clinician (PhC)/ Clinical Pharmacy Specialist

## 2017-03-07 DIAGNOSIS — Z7901 Long term (current) use of anticoagulants: Secondary | ICD-10-CM | POA: Diagnosis not present

## 2017-03-07 DIAGNOSIS — I5020 Unspecified systolic (congestive) heart failure: Secondary | ICD-10-CM | POA: Diagnosis not present

## 2017-03-07 DIAGNOSIS — I480 Paroxysmal atrial fibrillation: Secondary | ICD-10-CM

## 2017-03-07 DIAGNOSIS — Z5181 Encounter for therapeutic drug level monitoring: Secondary | ICD-10-CM | POA: Diagnosis not present

## 2017-03-07 LAB — PROTIME-INR: INR: 2.3 — AB (ref <=1.1)

## 2017-03-11 ENCOUNTER — Encounter: Payer: Self-pay | Admitting: Gynecologic Oncology

## 2017-03-11 ENCOUNTER — Ambulatory Visit: Payer: Medicare Other | Attending: Gynecologic Oncology | Admitting: Gynecologic Oncology

## 2017-03-11 VITALS — BP 145/73 | HR 62 | Resp 18 | Ht 62.5 in | Wt 116.9 lb

## 2017-03-11 DIAGNOSIS — Z9889 Other specified postprocedural states: Secondary | ICD-10-CM | POA: Insufficient documentation

## 2017-03-11 DIAGNOSIS — N83202 Unspecified ovarian cyst, left side: Secondary | ICD-10-CM

## 2017-03-11 DIAGNOSIS — Z09 Encounter for follow-up examination after completed treatment for conditions other than malignant neoplasm: Secondary | ICD-10-CM | POA: Diagnosis not present

## 2017-03-11 DIAGNOSIS — Z87898 Personal history of other specified conditions: Secondary | ICD-10-CM | POA: Insufficient documentation

## 2017-03-11 NOTE — Progress Notes (Signed)
POSTOPERATIVE FOLLOW-UP 03/11/17  HPI:  [name] is a 83 y.o. year old initially seen in consultation on 02/01/17 referred by Dr [name] for a left ovarian cyst.  She then underwent a robotic assisted lysis of adhesions and BSO on 11/25/25 without complications.  Her postoperative course was umcomplicated.  Her final pathology revealed a benign ovarian cyst.  She is seen today for a postoperative check and to discuss her pathology results and ongoing plan.  Since discharge from the hospital, she is feeling well.  She has improving appetite, normal bowel and bladder function, and pain controlled with minimal PO medication. She has no other complaints today.  Review of systems: Constitutional:  She has no weight gain or weight loss. She has no fever or chills. Eyes: No blurred vision Ears, Nose, Mouth, Throat: No dizziness, headaches or changes in hearing. No mouth sores. Cardiovascular: No chest pain, palpitations or edema. Respiratory:  No shortness of breath, wheezing or cough Gastrointestinal: She has normal bowel movements without diarrhea or constipation. She denies any nausea or vomiting. She denies blood in her stool or heart burn. Genitourinary:  She denies pelvic pain, pelvic pressure or changes in her urinary function. She has no hematuria, dysuria, or incontinence. She has no irregular vaginal bleeding or vaginal discharge Musculoskeletal: Denies muscle weakness or joint pains.  Skin:  She has no skin changes, rashes or itching Neurological:  Denies dizziness or headaches. No neuropathy, no numbness or tingling. Psychiatric:  She denies depression or anxiety. Hematologic/Lymphatic:   No easy bruising or bleeding   Physical Exam: Blood pressure (!) 145/73, pulse 62, resp. rate 18, height 5' 2.5" (1.588 m), weight 116 lb 14.4 oz (53 kg), SpO2 98 %. General: Well dressed, well nourished in no apparent distress.   HEENT:  Normocephalic and atraumatic, no lesions.  Extraocular  muscles intact. Sclerae anicteric. Pupils equal, round, reactive. No mouth sores or ulcers. Thyroid is normal size, not nodular, midline. Abdomen:  Soft, nontender, nondistended.  No palpable masses.  No hepatosplenomegaly.  No ascites. Normal bowel sounds.  No hernias.  Incisions are well healed Genitourinary: deferred Extremities: No cyanosis, clubbing or edema.  No calf tenderness or erythema. No palpable cords. Psychiatric: Mood and affect are appropriate. Neurological: Awake, alert and oriented x 3. Sensation is intact, no neuropathy.  Musculoskeletal: No pain, normal strength and range of motion.  Assessment:    83 y.o. year old with a history of a left ovarian cyst.   S/p robotic BSO on 02/19/17.   Plan: 1) Pathology reports reviewed today 2) Treatment counseling - I discussed the benign nature of this cyst. No further treatment necessary She was given the opportunity to ask questions, which were answered to her satisfaction, and she is agreement with the above mentioned plan of care.  3)  Return to clinic on a prn basis.  Angie Eva, MD

## 2017-03-11 NOTE — Patient Instructions (Signed)
No follow up needed at this time.  Follow up with you primary care provider.  Please call for any needs or concerns.

## 2017-03-14 ENCOUNTER — Other Ambulatory Visit: Payer: Self-pay | Admitting: Cardiovascular Disease

## 2017-03-14 ENCOUNTER — Encounter: Payer: Self-pay | Admitting: *Deleted

## 2017-03-25 DIAGNOSIS — I5021 Acute systolic (congestive) heart failure: Secondary | ICD-10-CM | POA: Diagnosis not present

## 2017-03-25 DIAGNOSIS — Z7901 Long term (current) use of anticoagulants: Secondary | ICD-10-CM | POA: Diagnosis not present

## 2017-03-25 LAB — PROTIME-INR: INR: 2.3 — AB (ref 0.9–1.1)

## 2017-03-26 ENCOUNTER — Ambulatory Visit (INDEPENDENT_AMBULATORY_CARE_PROVIDER_SITE_OTHER): Payer: Medicare Other | Admitting: Pharmacist

## 2017-03-26 DIAGNOSIS — I480 Paroxysmal atrial fibrillation: Secondary | ICD-10-CM

## 2017-03-27 ENCOUNTER — Ambulatory Visit: Payer: Medicare Other | Admitting: Neurology

## 2017-04-02 ENCOUNTER — Other Ambulatory Visit: Payer: Self-pay | Admitting: Neurology

## 2017-04-03 ENCOUNTER — Encounter: Payer: Self-pay | Admitting: Neurology

## 2017-04-03 ENCOUNTER — Ambulatory Visit (INDEPENDENT_AMBULATORY_CARE_PROVIDER_SITE_OTHER): Payer: Medicare Other | Admitting: Neurology

## 2017-04-03 ENCOUNTER — Other Ambulatory Visit: Payer: Self-pay

## 2017-04-03 VITALS — BP 156/65 | HR 61 | Ht 62.5 in | Wt 116.0 lb

## 2017-04-03 DIAGNOSIS — G63 Polyneuropathy in diseases classified elsewhere: Secondary | ICD-10-CM | POA: Diagnosis not present

## 2017-04-03 DIAGNOSIS — R269 Unspecified abnormalities of gait and mobility: Secondary | ICD-10-CM | POA: Diagnosis not present

## 2017-04-03 DIAGNOSIS — R413 Other amnesia: Secondary | ICD-10-CM | POA: Diagnosis not present

## 2017-04-03 MED ORDER — LEVETIRACETAM 250 MG PO TABS: 250.0000 mg | ORAL_TABLET | Freq: Two times a day (BID) | ORAL | 3 refills | Status: DC

## 2017-04-03 MED ORDER — HYDRALAZINE HCL 25 MG PO TABS: 25.0000 mg | ORAL_TABLET | Freq: Three times a day (TID) | ORAL | 1 refills | Status: DC

## 2017-04-03 NOTE — Patient Instructions (Signed)
    We will start Goldenrod for the neuropathy pain, take 250 mg one twice a day.

## 2017-04-03 NOTE — Progress Notes (Signed)
Reason for visit: Peripheral neuropathy  [name] is an 83 y.o. female  History of present illness:  [name] is a 82 year old right-handed white female with a history of a peripheral neuropathy associated with a gait disturbance. The patient uses a cane or a walker for ambulation, she has not had any falls since last seen. She still has discomfort in the feet during the day, and at night, she tries to go to bed. She is on gabapentin taking 300 mg 3 times during the day and 900 mg at night. She indicates that her memory has been stable, she has not had any worsening of this. She had a benign ovarian cyst resection on 02/19/2017. She is recovering from this. She returns to this office for an evaluation.  Past Medical History:  Diagnosis Date  . Abnormality of gait 09/07/2015  . Anemia, iron deficiency 09/16/2014  . Anxiety   . CAD (coronary artery disease)    a. 08/2014 NSTEMI/Cath: mild Ca2+ or RCA ostium, otw nl cors. CO 3.3 L/min (thermo), 3.7 L/min (Fick).  . Carotid arterial disease ([place]) 07/30/2012   carotid doppler; normal study  . Chronic anticoagulation, with coumadin, with PAF and CHADS2Vasc2 score of 4 09/16/2014  . Degenerative arthritis   . Diverticulosis   . Essential hypertension   . Foot fracture, left   . GERD (gastroesophageal reflux disease)   . History of nuclear stress test 09/19/2007   normal pattern of perfusion; post-stress EF 86%; EKG negative for ischemia; low risk scan  . Hyperlipidemia   . Left carotid bruit    a. 07/2015 Carotid U/S: 1-39% bilat ICA stenosis.  . Memory disorder 03/05/2014  . Mild renal insufficiency   . Mitral prolapse   . Neuropathy   . PAF (paroxysmal atrial fibrillation) (HCC)    a. 08/2014-->Coumadin (CHA2DS2VASc = 4).  . Peripheral neuropathy    Small fiber   . Pre-syncope    a. 04/2015 in setting of bradycardia-->CCB/BB doses adjusted.  . Pulmonary hypertension ([place])   . Valvular heart disease    a. 04/2015 Echo: EF  65-70%, no rwma, Gr1 DD, mild AS, triv AI, mild MS, mod MR, mod dil LA, mod TR, sev increased PASP.    Past Surgical History:  Procedure Laterality Date  .  Bilateral bunionectomies    . ABDOMINAL HYSTERECTOMY    . APPENDECTOMY    . DILATION AND CURETTAGE OF UTERUS    . FOOT FRACTURE SURGERY Left   . gallbladder resection    . LEFT HEART CATHETERIZATION WITH CORONARY ANGIOGRAM N/A 09/14/2014   Procedure: LEFT HEART CATHETERIZATION WITH CORONARY ANGIOGRAM;  Surgeon: Troy Sine, MD;  Location: [facility] CATH LAB;  Service: Cardiovascular;  Laterality: N/A;  . REPLACEMENT TOTAL KNEE Right   . resuspension procedure    . ROBOTIC ASSISTED BILATERAL SALPINGO OOPHERECTOMY Bilateral 02/19/2017   Procedure: XI ROBOTIC ASSISTED BILATERAL SALPINGO OOPHORECTOMY AND LYSIS OF ADHESION;  Surgeon: Everitt Amber, MD;  Location: [facility] ORS;  Service: Gynecology;  Laterality: Bilateral;  . TEAR DUCT PROBING WITH STRABISMUS REPAIR     Tear duct repair surgery  . TONSILLECTOMY    . UMBILICAL HERNIA REPAIR      Family History  Problem Relation Age of Onset  . Pneumonia Mother   . Coronary artery disease Mother   . Hypertension Mother   . Heart disease Father   . Cancer Father   . Hypertension Father   . Arthritis Sister     Social history:  reports that she has quit smoking. Her smoking use included Cigarettes. She has never used smokeless tobacco. She reports that she does not drink alcohol or use drugs.    Allergies  Allergen Reactions  . Avelox [Moxifloxacin Hcl In Nacl] Shortness Of Breath and Swelling  . Moxifloxacin Other (See Comments), Shortness Of Breath and Swelling    other  . Aricept [Donepezil Hcl] Diarrhea  . Codeine Swelling       . Donepezil Diarrhea  . Garlic Other (See Comments)    Gi problems  . Latex Hives, Itching, Rash and Other (See Comments)    Watery blisters   . Onion Other (See Comments)    Gi problems  . Sulfa Drugs Cross Reactors Swelling  . Other Other (See Comments)     Her throat closes up  . Duloxetine Rash  . Polysporin [Bacitracin-Polymyxin B] Other (See Comments)    other    Medications:  Prior to Admission medications   Medication Sig Start Date End Date Taking? Authorizing Provider  acetaminophen (TYLENOL) 500 MG tablet Take 500 mg by mouth every 6 (six) hours as needed for moderate pain.   Yes [provider]  atorvastatin (LIPITOR) 20 MG tablet Take 1 tablet (20 mg total) by mouth at bedtime. 10/11/16  Yes Troy Sine, MD  Calcium Carb-Cholecalciferol (CALCIUM 600+D) 600-800 MG-UNIT TABS Take 1 tablet by mouth daily.   Yes [provider]  carvedilol (COREG) 3.125 MG tablet TAKE 1 TABLET BY MOUTH TWICE A DAY 01/31/17  Yes Troy Sine, MD  cetirizine (ZYRTEC) 10 MG tablet Take 10 mg by mouth daily with breakfast.    Yes [provider]  cholecalciferol (VITAMIN D) 1000 UNITS tablet Take 1,000 Units by mouth daily.     Yes [provider]  colestipol (COLESTID) 1 G tablet Take 1 g by mouth 3 (three) times daily. For diarrhea and does not take at the same time as coumadin   Yes [provider]  cycloSPORINE (RESTASIS) 0.05 % ophthalmic emulsion Place 1 drop into both eyes 2 (two) times daily.    Yes [provider]  diltiazem (CARDIZEM SR) 60 MG 12 hr capsule Take 1 capsule (60 mg total) by mouth every 12 (twelve) hours. 12/11/16  Yes Troy Sine, MD  Emollient [facility] ADVANCED CARE EX) Apply 1 application topically 2 (two) times daily.   Yes [provider]  fenofibrate micronized (LOFIBRA) 134 MG capsule TAKE ONE CAPSULE BY MOUTH ONCE DAILY 09/07/16  Yes Troy Sine, MD  Flaxseed, Linseed, (FLAX SEED OIL) 1000 MG CAPS Take 1 capsule by mouth 3 (three) times daily.   Yes [provider]  fluticasone (FLONASE) 50 MCG/ACT nasal spray Place 2 sprays into both nostrils 2 (two) times daily.   Yes [provider]  gabapentin (NEURONTIN) 300 MG capsule TAKE 1  CAPSULE THREE TIMES DAILY AND 3 CAPSULES AT BEDTIME Patient taking differently: Take 300 mg in the morning and at lunch, then take 900 mg at bedtime 11/03/15  Yes Dohmeier, Asencion Partridge, MD  hydrALAZINE (APRESOLINE) 25 MG tablet Take 1 tablet (25 mg total) by mouth 3 (three) times daily. 04/03/17  Yes Troy Sine, MD  hydrochlorothiazide (HYDRODIURIL) 25 MG tablet TAKE 1 TABLET EVERY DAY 01/31/17  Yes Troy Sine, MD  LACTOBACILLUS RHAMNOSUS, GG, PO Take 1 capsule by mouth daily.   Yes [provider]  loperamide (IMODIUM A-D) 2 MG tablet Take 2 mg by mouth daily with breakfast.  Yes [provider]  memantine (NAMENDA) 10 MG tablet Take 1 tablet (10 mg total) by mouth 2 (two) times daily. 12/20/16  Yes Kathrynn Ducking, MD  Multiple Vitamin (MULTIVITAMIN) tablet Take 1 tablet by mouth daily.     Yes [provider]  OVER THE COUNTER MEDICATION Take 1 capsule by mouth 3 (three) times daily. *IBgard*   Yes [provider]  pantoprazole (PROTONIX) 40 MG tablet Take 40 mg by mouth daily.  11/15/15  Yes [provider]  pyridOXINE (VITAMIN B-6) 100 MG tablet Take 100 mg by mouth daily.   Yes [provider]  ranitidine (ZANTAC) 150 MG tablet Take 150 mg by mouth 2 (two) times daily.  07/26/16  Yes [provider]  simethicone (MYLICON) 759 MG chewable tablet Chew 125 mg by mouth as needed for flatulence.   Yes [provider]  vitamin B-12 (CYANOCOBALAMIN) 1000 MCG tablet Take 1,000 mcg by mouth daily.   Yes [provider]  warfarin (COUMADIN) 2 MG tablet TAKE 1 AND 1/2 TO 2 TABLETS DAILY AS DIRECTED BY COUMADIN CLINIC 03/14/17  Yes Croitoru, Mihai, MD  levETIRAcetam (KEPPRA) 250 MG tablet Take 1 tablet (250 mg total) by mouth 2 (two) times daily. 04/03/17   Kathrynn Ducking, MD    ROS:  Out of a complete 14 system review of symptoms, the patient complains only of the following symptoms, and all other reviewed systems are  negative.  Ringing in the ears Heart murmur Joint pain, back pain, achy muscles, walking difficulty Skin rash Food allergies Memory loss  Blood pressure (!) 156/65, pulse 61, height 5' 2.5" (1.588 m), weight 116 lb (52.6 kg).  Physical Exam  General: The patient is alert and cooperative at the time of the examination.  Skin: No significant peripheral edema is noted.   Neurologic Exam  Mental status: The patient is alert and oriented x 3 at the time of the examination. The patient has apparent normal recent and remote memory, with an apparently normal attention span and concentration ability. Mini-Mental Status Examination done today shows a total score of 29/30.   Cranial nerves: Facial symmetry is present. Speech is normal, no aphasia or dysarthria is noted. Extraocular movements are full. Visual fields are full.  Motor: The patient has good strength in all 4 extremities.  Sensory examination: Soft touch sensation is symmetric on the face, arms, and legs.  Coordination: The patient has good finger-nose-finger and heel-to-shin bilaterally.  Gait and station: The patient has a wide-based gait, the patient walks with a walker. Tandem gait was not attempted. Romberg is negative.  Reflexes: Deep tendon reflexes are symmetric, but are depressed.   Assessment/Plan:  1. Mild memory disturbance  2. Peripheral neuropathy  3. Gait disturbance  The patient is being relatively safe with ambulation, she had not had any falls. She still has discomfort in the feet, we will add low-dose Keppra taking 250 mg twice daily. She will contact me if this is effective and she wishes to continue the medication. She will follow-up in 6 months.   Jill Alexanders MD 04/03/2017 2:14 PM  [facility] [address] [place], [place] [address]  Phone [phone] Fax [phone]

## 2017-04-10 ENCOUNTER — Other Ambulatory Visit: Payer: Self-pay

## 2017-04-10 MED ORDER — HYDRALAZINE HCL 25 MG PO TABS: 25.0000 mg | ORAL_TABLET | Freq: Three times a day (TID) | ORAL | 1 refills | Status: DC

## 2017-04-17 ENCOUNTER — Other Ambulatory Visit: Payer: Self-pay

## 2017-04-17 MED ORDER — CARVEDILOL 3.125 MG PO TABS: 3.1250 mg | ORAL_TABLET | Freq: Two times a day (BID) | ORAL | 0 refills | Status: DC

## 2017-04-18 ENCOUNTER — Telehealth: Payer: Self-pay | Admitting: Pharmacist Clinician (PhC)/ Clinical Pharmacy Specialist

## 2017-04-18 NOTE — Telephone Encounter (Signed)
Coumadin letter 

## 2017-04-22 ENCOUNTER — Other Ambulatory Visit: Payer: Self-pay | Admitting: Cardiovascular Disease

## 2017-04-23 NOTE — Telephone Encounter (Signed)
New Message      *STAT* If patient is at the pharmacy, call can be transferred to refill team.   1. Which medications need to be refilled? (please list name of each medication and dose if known)  carvedilol (COREG) 3.125 MG tablet Take 1 tablet (3.125 mg total) by mouth 2 (two) times daily.     2. Which pharmacy/location (including street and city if local pharmacy) is medication to be sent to? [facility] on [address] and [address]   3. Do they need a 30 day or 90 day supply?  Needs a 2  week worth til her mail order company sends them

## 2017-04-23 NOTE — Telephone Encounter (Signed)
Follow up     Pt son is calling asking for a call back about this. He states that he has been trying to get this medication since 8 this morning. He is about to go out of town and pt is out of medication and does not drive. Please call.

## 2017-04-23 NOTE — Telephone Encounter (Signed)
Rx(s) sent to pharmacy electronically.  

## 2017-04-24 DIAGNOSIS — N83209 Unspecified ovarian cyst, unspecified side: Secondary | ICD-10-CM | POA: Diagnosis not present

## 2017-04-25 ENCOUNTER — Ambulatory Visit (INDEPENDENT_AMBULATORY_CARE_PROVIDER_SITE_OTHER): Payer: Medicare Other | Admitting: Pharmacist Clinician (PhC)/ Clinical Pharmacy Specialist

## 2017-04-25 DIAGNOSIS — I480 Paroxysmal atrial fibrillation: Secondary | ICD-10-CM | POA: Diagnosis not present

## 2017-04-25 DIAGNOSIS — Z7901 Long term (current) use of anticoagulants: Secondary | ICD-10-CM

## 2017-04-25 LAB — PROTIME-INR: INR: 3.5 — AB (ref 0.9–1.1)

## 2017-05-09 ENCOUNTER — Ambulatory Visit (INDEPENDENT_AMBULATORY_CARE_PROVIDER_SITE_OTHER): Payer: Medicare Other | Admitting: Pharmacist

## 2017-05-09 DIAGNOSIS — I5032 Chronic diastolic (congestive) heart failure: Secondary | ICD-10-CM | POA: Diagnosis not present

## 2017-05-09 DIAGNOSIS — D689 Coagulation defect, unspecified: Secondary | ICD-10-CM | POA: Diagnosis not present

## 2017-05-09 DIAGNOSIS — I480 Paroxysmal atrial fibrillation: Secondary | ICD-10-CM | POA: Diagnosis not present

## 2017-05-09 DIAGNOSIS — Z7901 Long term (current) use of anticoagulants: Secondary | ICD-10-CM | POA: Diagnosis not present

## 2017-05-09 LAB — PROTIME-INR: INR: 2.9 — AB (ref 0.9–1.1)

## 2017-05-13 ENCOUNTER — Other Ambulatory Visit: Payer: Self-pay | Admitting: Cardiovascular Disease

## 2017-05-22 ENCOUNTER — Other Ambulatory Visit: Payer: Self-pay | Admitting: Cardiovascular Disease

## 2017-05-27 ENCOUNTER — Telehealth: Payer: Self-pay | Admitting: Cardiovascular Disease

## 2017-05-27 ENCOUNTER — Other Ambulatory Visit: Payer: Self-pay

## 2017-05-27 MED ORDER — DILTIAZEM HCL ER 60 MG PO CP12: 60.0000 mg | ORAL_CAPSULE | Freq: Two times a day (BID) | ORAL | 1 refills | Status: DC

## 2017-05-27 NOTE — Telephone Encounter (Signed)
New MEssage   *STAT* If patient is at the pharmacy, call can be transferred to refill team.   1. Which medications need to be refilled? (please list name of each medication and dose if known) diltiazem 60mg   2. Which pharmacy/location (including street and city if local pharmacy) is medication to be sent to? [facility] [address]   3. Do they need a 30 day or 90 day supply? 90  Per pt will be out in 2-3 days. Mcarthur Rossetti is on back order.

## 2017-05-31 DIAGNOSIS — Q6689 Other  specified congenital deformities of feet: Secondary | ICD-10-CM | POA: Diagnosis not present

## 2017-05-31 DIAGNOSIS — L602 Onychogryphosis: Secondary | ICD-10-CM | POA: Diagnosis not present

## 2017-05-31 DIAGNOSIS — L84 Corns and callosities: Secondary | ICD-10-CM | POA: Diagnosis not present

## 2017-06-03 ENCOUNTER — Ambulatory Visit (INDEPENDENT_AMBULATORY_CARE_PROVIDER_SITE_OTHER): Payer: Medicare Other | Admitting: Pharmacist

## 2017-06-03 DIAGNOSIS — I480 Paroxysmal atrial fibrillation: Secondary | ICD-10-CM

## 2017-06-03 DIAGNOSIS — Z7901 Long term (current) use of anticoagulants: Secondary | ICD-10-CM

## 2017-06-03 DIAGNOSIS — I4891 Unspecified atrial fibrillation: Secondary | ICD-10-CM | POA: Diagnosis not present

## 2017-06-03 LAB — PROTIME-INR: INR: 3.8 — AB (ref 0.9–1.1)

## 2017-06-06 ENCOUNTER — Ambulatory Visit (INDEPENDENT_AMBULATORY_CARE_PROVIDER_SITE_OTHER): Payer: Medicare Other | Admitting: Cardiovascular Disease

## 2017-06-06 VITALS — BP 124/60 | HR 60 | Ht 62.5 in | Wt 115.0 lb

## 2017-06-06 DIAGNOSIS — Z7901 Long term (current) use of anticoagulants: Secondary | ICD-10-CM | POA: Diagnosis not present

## 2017-06-06 DIAGNOSIS — I059 Rheumatic mitral valve disease, unspecified: Secondary | ICD-10-CM

## 2017-06-06 DIAGNOSIS — I340 Nonrheumatic mitral (valve) insufficiency: Secondary | ICD-10-CM | POA: Diagnosis not present

## 2017-06-06 DIAGNOSIS — I480 Paroxysmal atrial fibrillation: Secondary | ICD-10-CM

## 2017-06-06 DIAGNOSIS — I350 Nonrheumatic aortic (valve) stenosis: Secondary | ICD-10-CM

## 2017-06-06 DIAGNOSIS — I2729 Other secondary pulmonary hypertension: Secondary | ICD-10-CM | POA: Diagnosis not present

## 2017-06-06 MED ORDER — ATORVASTATIN CALCIUM 20 MG PO TABS: 10.0000 mg | ORAL_TABLET | Freq: Every day | ORAL | 2 refills | Status: DC

## 2017-06-06 NOTE — Progress Notes (Signed)
Patient ID: [name], female   DOB: 1922-05-10, 83 y.o.   MRN: [id number]      HPI: [name] is a 83 y.o. female presents to the office today for a 6 month followup cardiology evaluation.  [name] has a history of hypertension, hyperlipidemia, as well as valvular heart disease. An echo Doppler study in December 2013 showed normal systolic function with an ejection fraction of 55-60%. She had mild stenosis of her aortic valve, moderately severe calcified anulus of the mitral valve with moderate MR, moderately severe LA dilatation,  mild-to-moderate RA dilatation, moderate tricuspid regurgitation and moderately severe pulmonary hypertension with PA pressure estimated at 66 mm. Last year, I started her on amlodipine at 2.5 mg.  She felt that she was breathing better since initiating this therapy and  her blood pressure had markedly improved. A followup echo Doppler study on 11/16/2013 showed normal systolic function with an ejection fraction at 60-65%.  Diastolic parameters were normal.  She had mitral annular calcification with moderate mitral regurgitation.  The left atrium was moderately dilated, the right atrium was moderately dilated, and her tricuspid stenosis was now considered severe.  Pulmonary pressures were slightly reduced from previously, but were still elevated at 61 mm.    She was admitted to the hospital on 09/13/2014 and discharged  3 days later.  On the day of admission she awakened from sleep with midsternal all chest discomfort which he initially felt was indigestion.  Her troponin was mildly elevated at 0.23, which increased to 1.15 and there were new inferior T-wave abnormalities.  She was felt to have suffered a non-ST segment elevation MI.  She also developed paroxysmal atrial fibrillation with RVR, which resulted in similar symptomatology as her admission and was felt most likely that this may have been the cause for her admission.  I performed cardiac  catheterization on 09/14/14  Fluoroscopy revealed severe mitral annular calcification and very mild calcification in the region of the aortic valve.  She had hyperdynamic LV function with an ejection fraction of a proximally 70%.  There was severe mitral annular calcification with 3+ MR.  There was mild calcification in the region of the RCA ostium, but otherwise normal-appearing coronary arteries.  Right heart catheterization was performed and her wedge pressure mean was 10.  LV pressure was 152/11.  Pulmonary artery pressure was 33/12.  Cardiac output was 3.3 L/m by the thermodilution method and 3.7 L/m by the Fick method.  When I saw her in April 2016 she was doing well and maintaining sinus rhythm with ventricular rate at 58 bpm. She was recently hospitalized on 05/08/2015 through 05/12/2015 with this and presyncope.  She was found to be bradycardic and had a junctional rhythm.  Her Cardizem, beta blocker therapy was discontinued.  She  Was dehydrated andalso had mild elevation of potassium with renal insufficiency and ACE inhibition was discontinued. She also had a urinary tract infection for which she was treated with Keflex. She was started back on iron therapy for iron deficiency anemia. At discharge, Cardizem low dose was restarted.  After going home from the hospital, on 05/17/2015 her heart rate increased to greater than 100 bpm. Her blood pressure now has been increased on her, reduced medications.  She is on Coumadin therapy  With a chads2vasc score of 4.  When I saw her, her blood pressure was elevated and with her episodes of increased heart rate.  I started her on carvedilol one half of a 3.125 mg  twice a day.  She has tolerated this well and denies any recurrent episodes of fast heartbeat or any episodes of dizziness.  She admits to trace edema above her pressure socks.  An echo Doppler study on 05/09/2015 showed an ejection fraction of 65-70%.  There was grade 1 diastolic dysfunction.  There was  mild aortic stenosis with trivial AR, severe mitral annular calcification with moderate MR, moderate left atrial dilatation, moderate TR and increased pulmonary pressures at 74 mm Hg. in December 2016, a carotid duplex study demonstrated heterogeneous plaque bilaterally.  There was 1-39% bilateral ICA stenoses.  She had normal subclavian arteries bilaterally.  She had patent vertebral arteries with antegrade flow  In October 2017 she had a fall and  tripped over a step.  She did not go to the emergency room, but saw her primary physician and a CT scan of her head revealed mild injury to her right occipital scalp but no underlying skull fracture or intracranial hemorrhage.  There was an air-fluid level of the maxillary sinus.   When I last saw her in April 2018.  She was not having any chest pain, although admitted to some mild shortness of breath.  She was walking with a cane.  specifically denies any chest pain.    She goes to a salt water pool twice per week.  She denies any episodes of dizziness.  She denies any further falls.  She is no longer driving.    Over the past 6 months, she denies recurrent chest pain episodes.  She admits that she is getting weaker.  She gets shortness of breath with walking.  She is unaware of recurrent arrhythmia.  She denies syncope.  In May 2018.  A follow-up echo Doppler study showed an EF of 60-65% with grade 2 diastolic dysfunction.  There was mild aortic stenosis with trivial AR, severe left atrial dilation with severe mitral regurgitation and she had significant pulmonary hypertension with a PA peak pressure estimate at 64 mm.  She is here in the office today with her son.  Past Medical History:  Diagnosis Date  . Abnormality of gait 09/07/2015  . Anemia, iron deficiency 09/16/2014  . Anxiety   . CAD (coronary artery disease)    a. 08/2014 NSTEMI/Cath: mild Ca2+ or RCA ostium, otw nl cors. CO 3.3 L/min (thermo), 3.7 L/min (Fick).  . Carotid arterial disease ([place])  07/30/2012   carotid doppler; normal study  . Chronic anticoagulation, with coumadin, with PAF and CHADS2Vasc2 score of 4 09/16/2014  . Degenerative arthritis   . Diverticulosis   . Essential hypertension   . Foot fracture, left   . GERD (gastroesophageal reflux disease)   . History of nuclear stress test 09/19/2007   normal pattern of perfusion; post-stress EF 86%; EKG negative for ischemia; low risk scan  . Hyperlipidemia   . Left carotid bruit    a. 07/2015 Carotid U/S: 1-39% bilat ICA stenosis.  . Memory disorder 03/05/2014  . Mild renal insufficiency   . Mitral prolapse   . Neuropathy   . PAF (paroxysmal atrial fibrillation) ([facility])    a. 08/2014-->Coumadin (CHA2DS2VASc = 4).  . Peripheral neuropathy    Small fiber   . Pre-syncope    a. 04/2015 in setting of bradycardia-->CCB/BB doses adjusted.  . Pulmonary hypertension ([place])   . Valvular heart disease    a. 04/2015 Echo: EF 65-70%, no rwma, Gr1 DD, mild AS, triv AI, mild MS, mod MR, mod dil LA, mod TR, sev increased PASP.  Past Surgical History:  Procedure Laterality Date  .  Bilateral bunionectomies    . ABDOMINAL HYSTERECTOMY    . APPENDECTOMY    . DILATION AND CURETTAGE OF UTERUS    . FOOT FRACTURE SURGERY Left   . gallbladder resection    . LEFT HEART CATHETERIZATION WITH CORONARY ANGIOGRAM N/A 09/14/2014   Procedure: LEFT HEART CATHETERIZATION WITH CORONARY ANGIOGRAM;  Surgeon: Troy Sine, MD;  Location: [facility] CATH LAB;  Service: Cardiovascular;  Laterality: N/A;  . REPLACEMENT TOTAL KNEE Right   . resuspension procedure    . ROBOTIC ASSISTED BILATERAL SALPINGO OOPHERECTOMY Bilateral 02/19/2017   Procedure: XI ROBOTIC ASSISTED BILATERAL SALPINGO OOPHORECTOMY AND LYSIS OF ADHESION;  Surgeon: Everitt Amber, MD;  Location: [facility] ORS;  Service: Gynecology;  Laterality: Bilateral;  . TEAR DUCT PROBING WITH STRABISMUS REPAIR     Tear duct repair surgery  . TONSILLECTOMY    . UMBILICAL HERNIA REPAIR      Allergies  Allergen  Reactions  . Avelox [Moxifloxacin Hcl In Nacl] Shortness Of Breath and Swelling  . Moxifloxacin Other (See Comments), Shortness Of Breath and Swelling    other  . Aricept [Donepezil Hcl] Diarrhea  . Codeine Swelling       . Donepezil Diarrhea  . Garlic Other (See Comments)    Gi problems  . Latex Hives, Itching, Rash and Other (See Comments)    Watery blisters   . Onion Other (See Comments)    Gi problems  . Sulfa Drugs Cross Reactors Swelling  . Other Other (See Comments)    Her throat closes up  . Duloxetine Rash  . Polysporin [Bacitracin-Polymyxin B] Other (See Comments)    other    Current Outpatient Prescriptions  Medication Sig Dispense Refill  . acetaminophen (TYLENOL) 500 MG tablet Take 500 mg by mouth every 6 (six) hours as needed for moderate pain.    Marland Kitchen atorvastatin (LIPITOR) 20 MG tablet Take 0.5 tablets (10 mg total) by mouth at bedtime. 90 tablet 2  . Calcium Carb-Cholecalciferol (CALCIUM 600+D) 600-800 MG-UNIT TABS Take 1 tablet by mouth daily.    . carvedilol (COREG) 3.125 MG tablet Take 1 tablet (3.125 mg total) by mouth 2 (two) times daily. 180 tablet 0  . cetirizine (ZYRTEC) 10 MG tablet Take 10 mg by mouth daily with breakfast.     . cholecalciferol (VITAMIN D) 1000 UNITS tablet Take 1,000 Units by mouth daily.      . colestipol (COLESTID) 1 G tablet Take 1 g by mouth 3 (three) times daily. For diarrhea and does not take at the same time as coumadin    . cycloSPORINE (RESTASIS) 0.05 % ophthalmic emulsion Place 1 drop into both eyes 2 (two) times daily.     Marland Kitchen diltiazem (CARDIZEM SR) 60 MG 12 hr capsule Take 1 capsule (60 mg total) by mouth 2 (two) times daily. 120 capsule 1  . Emollient (AVEENO ADVANCED CARE EX) Apply 1 application topically 2 (two) times daily.    . Flaxseed, Linseed, (FLAX SEED OIL) 1000 MG CAPS Take 1 capsule by mouth 3 (three) times daily.    . fluticasone (FLONASE) 50 MCG/ACT nasal spray Place 2 sprays into both nostrils 2 (two) times daily.     Marland Kitchen gabapentin (NEURONTIN) 300 MG capsule TAKE 1 CAPSULE THREE TIMES DAILY AND 3 CAPSULES AT BEDTIME (Patient taking differently: Take 300 mg in the morning and at lunch, then take 900 mg at bedtime) 540 capsule 1  . hydrALAZINE (APRESOLINE) 25 MG tablet Take  1 tablet (25 mg total) by mouth 3 (three) times daily. 270 tablet 1  . hydrochlorothiazide (HYDRODIURIL) 25 MG tablet TAKE 1 TABLET EVERY DAY 90 tablet 3  . LACTOBACILLUS RHAMNOSUS, GG, PO Take 1 capsule by mouth daily.    Marland Kitchen levETIRAcetam (KEPPRA) 250 MG tablet Take 1 tablet (250 mg total) by mouth 2 (two) times daily. 60 tablet 3  . loperamide (IMODIUM A-D) 2 MG tablet Take 2 mg by mouth daily with breakfast.    . memantine (NAMENDA) 10 MG tablet Take 1 tablet (10 mg total) by mouth 2 (two) times daily. 180 tablet 3  . Multiple Vitamin (MULTIVITAMIN) tablet Take 1 tablet by mouth daily.      Marland Kitchen OVER THE COUNTER MEDICATION Take 1 capsule by mouth 3 (three) times daily. *IBgard*    . pantoprazole (PROTONIX) 40 MG tablet Take 40 mg by mouth daily.     Marland Kitchen pyridOXINE (VITAMIN B-6) 100 MG tablet Take 100 mg by mouth daily.    . ranitidine (ZANTAC) 150 MG tablet Take 150 mg by mouth 2 (two) times daily.     . simethicone (MYLICON) 161 MG chewable tablet Chew 125 mg by mouth as needed for flatulence.    . vitamin B-12 (CYANOCOBALAMIN) 1000 MCG tablet Take 1,000 mcg by mouth daily.    Marland Kitchen warfarin (COUMADIN) 2 MG tablet TAKE 1 AND 1/2 TO 2 TABLETS DAILY AS DIRECTED BY COUMADIN CLINIC 180 tablet 1   No current facility-administered medications for this visit.     Socially she is widowed. Has 2 children and 2 grandchildren. She does walk; no tobacco or alcohol use. She resides at [facility]: independent living.  She remains active.  ROS General: Negative; No fevers, chills, or night sweats; more weakness HEENT: Negative; No changes in vision or hearing, sinus congestion, difficulty swallowing Pulmonary: Negative; No cough, wheezing,  hemoptysis Cardiovascular: See history of present illness  GI: Negative; No nausea, vomiting, diarrhea, or abdominal pain GU:  Recent UTI Musculoskeletal: Negative; no myalgias, joint pain, or weakness; she is now walking with a cane Hematologic/Oncology: Negative; no easy bruising, bleeding Endocrine: Negative; no heat/cold intolerance; no diabetes Neuro: Negative; no changes in balance, headaches Skin: Negative; No rashes or skin lesions Psychiatric: Negative; No behavioral problems, depression Sleep: Negative; No snoring, daytime sleepiness, hypersomnolence, bruxism, restless legs, hypnogognic hallucinations, no cataplexy Other comprehensive 14 point system review is negative.   PE BP 124/60   Pulse 60   Ht 5' 2.5" (1.588 m)   Wt 115 lb (52.2 kg)   BMI 20.70 kg/m    Repeat blood pressure by me 132/60  Wt Readings from Last 3 Encounters:  06/06/17 115 lb (52.2 kg)  04/03/17 116 lb (52.6 kg)  03/11/17 116 lb 14.4 oz (53 kg)   General: Alert, oriented, no distress.  Skin: normal turgor, no rashes, warm and dry HEENT: Normocephalic, atraumatic. Pupils equal round and reactive to light; sclera anicteric; extraocular muscles intact;  Nose without nasal septal hypertrophy Mouth/Parynx benign; Mallinpatti scale 2 Neck: No JVD, no carotid bruits; normal carotid upstroke Lungs: clear to ausculatation and percussion; no wheezing or rales Chest wall: without tenderness to palpitation Heart: PMI not displaced, RRR, s1 s2 normal, 0-9/6 systolic murmur along the left sternal border and apex no diastolic murmur, no rubs, gallops, thrills, or heaves Abdomen: soft, nontender; no hepatosplenomehaly, BS+; abdominal aorta nontender and not dilated by palpation. Back: no CVA tenderness Pulses 2+ Musculoskeletal: full range of motion, normal strength, no joint deformities Extremities:  no clubbing cyanosis or edema, Homan's sign negative  Neurologic: grossly nonfocal; Cranial nerves grossly  wnl Psychologic: Normal mood and affect   ECG (independently read by me): Normal sinus rhythm at 60 bpm.  Left axis deviation.  Borderline voltage criteria for LVH in aVL.  April 2018 ECG (independently read by me): Sinus bradycardia 58 bpm.  PH by voltage.  No significant ST-T changes.  October 2017 ECG (independently read by me): Normal sinus rhythm at 66 bpm.  No ectopy.  Normal intervals.  Borderline LVH by voltage in aVL.  December 2016 ECG (independently read by me): Normal sinus rhythm at 78 bpm.  Normal intervals.  ECG (independently read by me):  Sinus rhythm with sinus arrhythmia at 80 bpm.  Normal intervals.  No ST segment changes.  April 2016ECG (independently read by me): Sinus bradycardia 58 bpm.  Left axis deviation.  LVH by voltage criteria in aVL.  No significant ST segment changes.  October 2015 ECG (independently read by me): Sinus bradycardia 48 beats per minute.  No ectopy.  PR interval 164 ms  Prior April 2015 ECG (independently by me) sinus bradycardia 50 beats per minute.  No ectopy.  Normal intervals.  No ST segment changes.  Prior 09/02/2013 ECG (independently read by me ): Sinus bradycardia at 51 beats per minute. Left axis deviation. No significant ST change. PR interval 160 ms. QTc interval 418 ms.   LABS:  BMP Latest Ref Rng & Units 02/13/2017 12/11/2016 11/20/2015  Glucose 65 - 99 mg/dL 90 87 89  BUN 6 - 20 mg/dL 25(H) 28(H) 12  Creatinine 0.44 - 1.00 mg/dL 0.83 0.79 0.58  Sodium 135 - 145 mmol/L 139 140 141  Potassium 3.5 - 5.1 mmol/L 4.3 4.0 3.6  Chloride 101 - 111 mmol/L 102 103 106  CO2 22 - 32 mmol/L '26 28 25  '$ Calcium 8.9 - 10.3 mg/dL 9.8 10.0 9.7    Hepatic Function Latest Ref Rng & Units 02/13/2017 12/11/2016 11/20/2015  Total Protein 6.5 - 8.1 g/dL 8.0 7.4 7.8  Albumin 3.5 - 5.0 g/dL 4.0 4.2 4.2  AST 15 - 41 U/L 26 23 32  ALT 14 - 54 U/L '16 13 18  '$ Alk Phosphatase 38 - 126 U/L 45 43 40  Total Bilirubin 0.3 - 1.2 mg/dL 0.3 0.3 0.2(L)    CBC  Latest Ref Rng & Units 02/13/2017 12/10/2016 11/20/2015  WBC 4.0 - 10.5 K/uL 7.3 5.4 4.1  Hemoglobin 12.0 - 15.0 g/dL 11.2(L) 11.5(L) 11.9(L)  Hematocrit 36.0 - 46.0 % 33.7(L) 35.2 34.7(L)  Platelets 150 - 400 K/uL 350 278 198   Lab Results  Component Value Date   MCV 83.6 02/13/2017   MCV 87.6 12/10/2016   MCV 83.4 11/20/2015   Lab Results  Component Value Date   TSH 1.95 12/10/2016   Lab Results  Component Value Date   HGBA1C 5.6 09/13/2014    BNP No results found for: PROBNP   Lipid Panel     Component Value Date/Time   CHOL 111 12/11/2016 0707   TRIG 71 12/11/2016 0707   HDL 66 12/11/2016 0707   CHOLHDL 1.7 12/11/2016 0707   VLDL 14 12/11/2016 0707   LDLCALC 31 12/11/2016 0707     RADIOLOGY: No results found.  IMPRESSION:  1. PAF (paroxysmal atrial fibrillation) ([place])   2. Mitral valve disease   3. Severe mitral regurgitation   4. Mild aortic valve stenosis   5. Chronic anticoagulation   6. Other secondary pulmonary hypertension ([place])  ASSESSMENT AND PLAN: [name] is a very pleasant alert 83 years old Caucasian female who has a history of hypertension, hyperlipidemia, significant mitral annular calcification with MR.  During her hospitalization in 2016 she was noted to have paroxysmal atrial fibrillation with rapid ventricular response which may have been the inciting cause of her chest pain which awakened her from sleep.   An echo Doppler study in September 2016 showed an ejection fraction at 65-70%.  There was mild LVH.  Grade 1 diastolic dysfunction.  There was evidence for mild aortic valve stenosis with trivial AR, severely calcified mitral annulus with mild stenosis and moderate regurgitation.  She had moderate LA dilatation and moderate TR. and at that time showed significant pulmonary pressure elevation at 74 mm Hg.  Cardiac catheterization did not reveal significant coronary obstructive disease.  Following diuresis she did not have significant  pulmonary hypertension and her PA pressure on right heart catheterization was only 33 mm systolically.   Her most recent echo Doppler study of May 2018 continues to show normal systolic function with grade 2 diastolic dysfunction and now suggests severe mitral regurgitation in the setting of a severely dilated left atrium, mild aortic stenosis with trivial AR, and a PA pressure estimated at 64 mm.  I had a long discussion with both she and her son today in the office.  She has now become a little more frail.  Although she is walking with a cane and still is in independent living she has had some falls, and had 2 falls over the past year. I am concerned about potential fall risk in the setting of her ongoing warfarin therapy.  I had a lengthy discussion concerning the risks benefits of discontinuing warfarin and being maintained on aspirin alone with potential increased thromboembolic risk.  If she develops recurrent AF versus potential bleed risk as she is becoming weaker and is set up for recurrent falls.  She is no longer driving.  She is maintaining sinus rhythm.  In addition, her left atrium is severely dilated, but with the presence of severe mitral regurgitation.  There is less potential for stasis.  Into this dilated left atrium.  Due to both normal and regurgitant flow.  They would like to consider this further.  In the interim, I will obtain a a vent monitor.  If she is having any episodes of PAF for which she is unaware.  I will see her back in the office in 6-8 weeks for reevaluation.  Presently, she is not having any recurrent chest pain.  Her rhythm is stable without AF, her blood pressure is controlled  Time spent: 25 minutes  Troy Sine, MD, [facility]  06/08/2017 11:58 AM

## 2017-06-06 NOTE — Patient Instructions (Signed)
Medication Instructions:  STOP fenofibrate  DECREASE atorvastatin (Lipitor) to 10 mg (1/2 tablet) daily  Testing/Procedures: Your physician has recommended that you wear a 30 day event monitor. Event monitors are medical devices that record the heart's electrical activity. Doctors most often [place] these monitors to diagnose arrhythmias. Arrhythmias are problems with the speed or rhythm of the heartbeat. The monitor is a small, portable device. You can wear one while you do your normal daily activities. This is usually used to diagnose what is causing palpitations/syncope (passing out).  Follow-Up: Your physician recommends that you schedule a follow-up appointment in: 6-8 weeks with Dr. Claiborne Billings.    Any Other Special Instructions Will Be Listed Below (If Applicable).     If you need a refill on your cardiac medications before your next appointment, please call your pharmacy.

## 2017-06-13 DIAGNOSIS — Z23 Encounter for immunization: Secondary | ICD-10-CM | POA: Diagnosis not present

## 2017-06-14 NOTE — Telephone Encounter (Signed)
Pt wants to know if she is supposed to taking Fenofibrate?

## 2017-06-17 ENCOUNTER — Telehealth: Payer: Self-pay | Admitting: Cardiovascular Disease

## 2017-06-17 ENCOUNTER — Ambulatory Visit (INDEPENDENT_AMBULATORY_CARE_PROVIDER_SITE_OTHER): Payer: Medicare Other | Admitting: Pharmacist

## 2017-06-17 DIAGNOSIS — Z7901 Long term (current) use of anticoagulants: Secondary | ICD-10-CM | POA: Diagnosis not present

## 2017-06-17 DIAGNOSIS — I480 Paroxysmal atrial fibrillation: Secondary | ICD-10-CM | POA: Diagnosis not present

## 2017-06-17 LAB — PROTIME-INR: INR: 2.1 — AB (ref 0.9–1.1)

## 2017-06-17 NOTE — Telephone Encounter (Signed)
Returned call to patient.She stated for the past 2 days she has noticed swelling in ankles, right ankle worse.Weight stable.No sob.Diet has not changed.She is taking medications as prescribed.Advised to keep feet elevated.I will send message to [facility] for advice.

## 2017-06-17 NOTE — Telephone Encounter (Signed)
Mrs.Strawderman is calling because she is retaining fluids and is experiencing swelling in both ankles , but the right one is much worse than the other

## 2017-06-17 NOTE — Telephone Encounter (Signed)
Dc at [facility]

## 2017-06-17 NOTE — Telephone Encounter (Signed)
agree

## 2017-06-18 NOTE — Telephone Encounter (Signed)
LMOM; Medication clarified with patient.  Noted Rx for atorvastatin and diltiazem were sent on 05/27/2017.   No other intervention needed at this time.

## 2017-06-19 NOTE — Telephone Encounter (Signed)
Spoke to patient advised Dr.Kelly agreed keep feet elevated.She will also wear compression hose.Advised to call back if needed.

## 2017-06-20 ENCOUNTER — Other Ambulatory Visit: Payer: Self-pay | Admitting: Cardiovascular Disease

## 2017-06-20 NOTE — Telephone Encounter (Signed)
Rx has been sent to the pharmacy electronically. ° °

## 2017-06-25 ENCOUNTER — Telehealth: Payer: Self-pay | Admitting: Pharmacist

## 2017-06-25 NOTE — Telephone Encounter (Signed)
Patient calling and requested to speak with [name]. Patient states that she received a letter from our office.

## 2017-06-26 ENCOUNTER — Ambulatory Visit (INDEPENDENT_AMBULATORY_CARE_PROVIDER_SITE_OTHER): Payer: Medicare Other

## 2017-06-26 ENCOUNTER — Telehealth: Payer: Self-pay | Admitting: Cardiovascular Disease

## 2017-06-26 DIAGNOSIS — I480 Paroxysmal atrial fibrillation: Secondary | ICD-10-CM | POA: Diagnosis not present

## 2017-06-26 MED ORDER — DILTIAZEM HCL ER 60 MG PO CP12: 60.0000 mg | ORAL_CAPSULE | Freq: Two times a day (BID) | ORAL | 0 refills | Status: DC

## 2017-06-26 NOTE — Telephone Encounter (Signed)
°*  STAT* If patient is at the pharmacy, call can be transferred to refill team.   1. Which medications need to be refilled? (please list name of each medication and dose if known) diltiazem 60 mg   2. Which pharmacy/location (including street and city if local pharmacy) is medication to be sent to? [facility] [phone]  3. Do they need a 30 day or 90 day supply? 90  Patient asking for f/u

## 2017-06-27 DIAGNOSIS — L03115 Cellulitis of right lower limb: Secondary | ICD-10-CM | POA: Diagnosis not present

## 2017-06-27 DIAGNOSIS — S90821A Blister (nonthermal), right foot, initial encounter: Secondary | ICD-10-CM | POA: Diagnosis not present

## 2017-06-27 NOTE — Telephone Encounter (Signed)
Returned call to patient - she reports that there was some confusion about her medications, but her daughter in law helped her to figure things out.  No questions at this time

## 2017-06-28 ENCOUNTER — Other Ambulatory Visit: Payer: Self-pay | Admitting: *Deleted

## 2017-06-28 ENCOUNTER — Ambulatory Visit (INDEPENDENT_AMBULATORY_CARE_PROVIDER_SITE_OTHER): Payer: Medicare Other | Admitting: Podiatry

## 2017-06-28 ENCOUNTER — Encounter: Payer: Self-pay | Admitting: Podiatry

## 2017-06-28 DIAGNOSIS — L84 Corns and callosities: Secondary | ICD-10-CM

## 2017-06-28 DIAGNOSIS — M76829 Posterior tibial tendinitis, unspecified leg: Secondary | ICD-10-CM

## 2017-06-28 MED ORDER — HYDROCHLOROTHIAZIDE 25 MG PO TABS: 25.0000 mg | ORAL_TABLET | Freq: Every day | ORAL | 3 refills | Status: DC

## 2017-06-28 NOTE — Progress Notes (Signed)
This patient presents the office with chief complaint of a painful callus under her big toe, right foot.  She says that that callus area became extremely swollen and she was seen at urgent care.  She says urgent care lanced her callus/blister right foot and took a culture.  She presents to the office today stating she is taking antibiotics. Due to the findings from the urgent care.  She was seen previously in my office months ago for a callus under her big toe joint of her right foot.  She presents the office today for continued evaluation and treatment of her callus/blister.  General Appearance  Alert, conversant and in no acute stress.  Vascular  Dorsalis pedis and posterior pulses are palpable  bilaterally.  Capillary return is within normal limits  Bilaterally. Temperature is within normal limits  Bilaterally  Neurologic  Senn-Weinstein monofilament wire test within normal limits  bilaterally. Muscle power  Within normal limits bilaterally.  Nails Thick disfigured discolored nails with subungual debride bilaterally from hallux to fifth toes bilaterally. No evidence of bacterial infection or drainage bilaterally.  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  Subtalar joint arthritis secondary to PTTD.  Skin  neurotropic skin noted except under the first MPJ of the right foot.  There is a callus present under the big toe joint of the right foot and one area where a blister was lanced on her right foot.  Minimal redness noted at the distal aspect of the callus.  No streaking or fluctuance noted.   Callus/Blister right foot  PTTD right foot.  ROV  debridement of callus on the right foot.  This callus was then bandaged due to the Lancing  of the blister under her callus right foot.  Neosporin and dry sterile dressing was applied and dispersion padding was provided.  She is to leave the padding on for 3 days to allow healing to occur and then  she was told she could then soak her foot daily.  RTC 10 weeks.     Gardiner Barefoot DPM

## 2017-07-02 ENCOUNTER — Other Ambulatory Visit: Payer: Self-pay | Admitting: *Deleted

## 2017-07-02 MED ORDER — HYDROCHLOROTHIAZIDE 25 MG PO TABS: 25.0000 mg | ORAL_TABLET | Freq: Every day | ORAL | 3 refills | Status: DC

## 2017-07-08 ENCOUNTER — Ambulatory Visit (INDEPENDENT_AMBULATORY_CARE_PROVIDER_SITE_OTHER): Payer: Medicare Other | Admitting: Pharmacist

## 2017-07-08 DIAGNOSIS — I480 Paroxysmal atrial fibrillation: Secondary | ICD-10-CM

## 2017-07-08 DIAGNOSIS — I4891 Unspecified atrial fibrillation: Secondary | ICD-10-CM | POA: Diagnosis not present

## 2017-07-08 DIAGNOSIS — Z7901 Long term (current) use of anticoagulants: Secondary | ICD-10-CM

## 2017-07-08 LAB — PROTIME-INR: INR: 1.2 — AB (ref 0.9–1.1)

## 2017-07-15 ENCOUNTER — Other Ambulatory Visit: Payer: Self-pay | Admitting: Cardiovascular Disease

## 2017-07-15 ENCOUNTER — Ambulatory Visit (INDEPENDENT_AMBULATORY_CARE_PROVIDER_SITE_OTHER): Payer: Medicare Other | Admitting: Pharmacist

## 2017-07-15 DIAGNOSIS — Z7901 Long term (current) use of anticoagulants: Secondary | ICD-10-CM | POA: Diagnosis not present

## 2017-07-15 DIAGNOSIS — I480 Paroxysmal atrial fibrillation: Secondary | ICD-10-CM

## 2017-07-15 DIAGNOSIS — I4891 Unspecified atrial fibrillation: Secondary | ICD-10-CM | POA: Diagnosis not present

## 2017-07-15 LAB — PROTIME-INR: INR: 1.4 — AB (ref 0.9–1.1)

## 2017-07-22 ENCOUNTER — Ambulatory Visit (INDEPENDENT_AMBULATORY_CARE_PROVIDER_SITE_OTHER): Payer: Medicare Other | Admitting: Pharmacist Clinician (PhC)/ Clinical Pharmacy Specialist

## 2017-07-22 DIAGNOSIS — I480 Paroxysmal atrial fibrillation: Secondary | ICD-10-CM | POA: Diagnosis not present

## 2017-07-22 DIAGNOSIS — Z7901 Long term (current) use of anticoagulants: Secondary | ICD-10-CM

## 2017-07-22 DIAGNOSIS — I4891 Unspecified atrial fibrillation: Secondary | ICD-10-CM | POA: Diagnosis not present

## 2017-07-22 LAB — PROTIME-INR: INR: 1.7 — AB (ref 0.9–1.1)

## 2017-07-23 ENCOUNTER — Encounter: Payer: Self-pay | Admitting: Podiatry

## 2017-07-23 ENCOUNTER — Ambulatory Visit (INDEPENDENT_AMBULATORY_CARE_PROVIDER_SITE_OTHER): Payer: Medicare Other | Admitting: Podiatry

## 2017-07-23 ENCOUNTER — Other Ambulatory Visit: Payer: Self-pay | Admitting: Neurology

## 2017-07-23 DIAGNOSIS — D689 Coagulation defect, unspecified: Secondary | ICD-10-CM

## 2017-07-23 DIAGNOSIS — L84 Corns and callosities: Secondary | ICD-10-CM

## 2017-07-23 DIAGNOSIS — M76829 Posterior tibial tendinitis, unspecified leg: Secondary | ICD-10-CM

## 2017-07-23 NOTE — Progress Notes (Signed)
This patient presents the office with chief complaint of a painful callus under her big toe, right foot.  She says that that callus area  Has become thick and enlarged and she believes has broken down again.  She is accompanied  by her son  for this visit .  She was seen previously in my office months ago for a callus under her big toe joint of her right foot.  She presents the office today for continued evaluation and treatment of her callus/blister.  General Appearance  Alert, conversant and in no acute stress.  Vascular  Dorsalis pedis and posterior pulses are palpable  bilaterally.  Capillary return is within normal limits  Bilaterally. Temperature is within normal limits  Bilaterally  Neurologic  Senn-Weinstein monofilament wire test within normal limits  bilaterally. Muscle power  Within normal limits bilaterally.  Nails Thick disfigured discolored nails with subungual debride bilaterally from hallux to fifth toes bilaterally. No evidence of bacterial infection or drainage bilaterally.  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  Subtalar joint arthritis secondary to PTTD.  Skin  neurotropic skin noted except under the first MPJ of the right foot.  There is a callus present under the big toe joint of the right foot and one area where a blister was lanced on her right foot.  Minimal redness noted at the distal aspect of the callus.  No streaking or fluctuance noted.   Callus/ right foot  PTTD right foot.  ROV  debridement of callus on the right foot.  This callus was then debrided revealing no evidence of ulcer with drainage.   Her pre-ulcerous lesion was padded.  She was told to make an appointment with Liliane Channel  for his evaluation to offload this painful callus.  She is to return to the office in 6 weeks for my evaluation of her callus  right foot     Gardiner Barefoot DPM

## 2017-08-05 ENCOUNTER — Ambulatory Visit (INDEPENDENT_AMBULATORY_CARE_PROVIDER_SITE_OTHER): Payer: Medicare Other | Admitting: Pharmacist Clinician (PhC)/ Clinical Pharmacy Specialist

## 2017-08-05 ENCOUNTER — Ambulatory Visit: Payer: Medicare Other | Admitting: Orthotics

## 2017-08-05 DIAGNOSIS — I480 Paroxysmal atrial fibrillation: Secondary | ICD-10-CM | POA: Diagnosis not present

## 2017-08-05 DIAGNOSIS — L84 Corns and callosities: Secondary | ICD-10-CM

## 2017-08-05 DIAGNOSIS — M76829 Posterior tibial tendinitis, unspecified leg: Secondary | ICD-10-CM

## 2017-08-05 DIAGNOSIS — Z7901 Long term (current) use of anticoagulants: Secondary | ICD-10-CM

## 2017-08-05 LAB — PROTIME-INR: INR: 1.4 — AB (ref 0.9–1.1)

## 2017-08-05 NOTE — Progress Notes (Signed)
Rather than cast [name] and her incurr the expense; I took old OTS F/O and make a dispersion cut out as wll as putting a buttress around the cut out.  This being done to offload pressure on 1 met callus.

## 2017-08-06 ENCOUNTER — Telehealth: Payer: Self-pay | Admitting: Cardiovascular Disease

## 2017-08-06 DIAGNOSIS — Z0000 Encounter for general adult medical examination without abnormal findings: Secondary | ICD-10-CM | POA: Diagnosis not present

## 2017-08-06 DIAGNOSIS — I10 Essential (primary) hypertension: Secondary | ICD-10-CM | POA: Diagnosis not present

## 2017-08-06 DIAGNOSIS — E7800 Pure hypercholesterolemia, unspecified: Secondary | ICD-10-CM | POA: Diagnosis not present

## 2017-08-06 DIAGNOSIS — E559 Vitamin D deficiency, unspecified: Secondary | ICD-10-CM | POA: Diagnosis not present

## 2017-08-06 NOTE — Telephone Encounter (Signed)
F/u message  Pt f/u on call back from [facility]

## 2017-08-06 NOTE — Telephone Encounter (Signed)
Returned call to pt she states that she is calling Kristen back, forwarding message

## 2017-08-06 NOTE — Telephone Encounter (Signed)
New Message ° °Pt call stating she was returning RN call. Please call back to discuss °

## 2017-08-06 NOTE — Telephone Encounter (Signed)
See anticoag note

## 2017-08-07 DIAGNOSIS — J45909 Unspecified asthma, uncomplicated: Secondary | ICD-10-CM | POA: Diagnosis not present

## 2017-08-07 DIAGNOSIS — K449 Diaphragmatic hernia without obstruction or gangrene: Secondary | ICD-10-CM | POA: Diagnosis not present

## 2017-08-07 DIAGNOSIS — M545 Low back pain: Secondary | ICD-10-CM | POA: Diagnosis not present

## 2017-08-07 DIAGNOSIS — I38 Endocarditis, valve unspecified: Secondary | ICD-10-CM | POA: Diagnosis not present

## 2017-08-07 DIAGNOSIS — G629 Polyneuropathy, unspecified: Secondary | ICD-10-CM | POA: Diagnosis not present

## 2017-08-07 DIAGNOSIS — K219 Gastro-esophageal reflux disease without esophagitis: Secondary | ICD-10-CM | POA: Diagnosis not present

## 2017-08-07 DIAGNOSIS — M542 Cervicalgia: Secondary | ICD-10-CM | POA: Diagnosis not present

## 2017-08-07 DIAGNOSIS — E7800 Pure hypercholesterolemia, unspecified: Secondary | ICD-10-CM | POA: Diagnosis not present

## 2017-08-07 DIAGNOSIS — I4891 Unspecified atrial fibrillation: Secondary | ICD-10-CM | POA: Diagnosis not present

## 2017-08-07 DIAGNOSIS — I10 Essential (primary) hypertension: Secondary | ICD-10-CM | POA: Diagnosis not present

## 2017-08-08 ENCOUNTER — Ambulatory Visit (INDEPENDENT_AMBULATORY_CARE_PROVIDER_SITE_OTHER): Payer: Medicare Other | Admitting: Physician Assistant

## 2017-08-08 ENCOUNTER — Encounter: Payer: Self-pay | Admitting: Physician Assistant

## 2017-08-08 VITALS — BP 139/74 | HR 74 | Ht 62.0 in | Wt 113.4 lb

## 2017-08-08 DIAGNOSIS — I10 Essential (primary) hypertension: Secondary | ICD-10-CM

## 2017-08-08 DIAGNOSIS — I480 Paroxysmal atrial fibrillation: Secondary | ICD-10-CM | POA: Diagnosis not present

## 2017-08-08 DIAGNOSIS — E785 Hyperlipidemia, unspecified: Secondary | ICD-10-CM

## 2017-08-08 DIAGNOSIS — I739 Peripheral vascular disease, unspecified: Secondary | ICD-10-CM

## 2017-08-08 DIAGNOSIS — I779 Disorder of arteries and arterioles, unspecified: Secondary | ICD-10-CM

## 2017-08-08 DIAGNOSIS — I340 Nonrheumatic mitral (valve) insufficiency: Secondary | ICD-10-CM | POA: Diagnosis not present

## 2017-08-08 NOTE — Progress Notes (Signed)
Cardiology Office Note    Date:  08/10/2017   ID:  [name], DOB May 05, 1922, MRN [id number]  PCP:  Angie Pretty, MD  Cardiologist:  Dr. Claiborne Billings  Chief Complaint  Patient presents with  . Follow-up    seen for Dr. Claiborne Billings    History of Present Illness:  [name] is a 83 y.o. female with PMH of HTN, HLD, carotid artery disease, PAF on coumadin and mitral valve disease.  She was admitted in January 2016 with atrial fibrillation with RVR.  His troponin was elevated at the time.  Cardiac catheterization performed on 09/14/2014 showed mild calcification in ostial RCA, otherwise essentially normal coronaries.  She did have severe 3+ MR.  Given her advanced age, her severe MR was treated medically.  She was admitted in September 2016 with presyncope and found to be bradycardic and had a junctional rhythm.  Her Cardizem and beta-blocker therapy were discontinued.  She was hydrated and had a mild elevation of potassium with renal insufficiency, ACE inhibitor was also discontinue at that time.  At discharge, low-dose Cardizem was restarted.  Echocardiogram obtained on 12/19/2016 showed EF 60-65%, grade 2 DD, severe mitral annular calcification with severe MR, severe LAE, PA peak pressure 64 mmHg.   Patient presents today for cardiology office visit.  She has not had any significant fall recently.  Recent heart monitor showed mainly sinus rhythm without significant prolonged pauses or recurrent atrial fibrillation.  There are several fast rhythm strip that is more consistent with motion artifact and difficult to interpret.  I have discussed potential benefit and risk of having her coming off of Coumadin given her advanced age.  Eventually we agreed to continue on the Coumadin for the time being given the fact that she has not had any major falls recently.  She did have a fall of the year before last year mainly due to mechanical reasons.  She denies any dizziness or significant shortness of  breath.   Past Medical History:  Diagnosis Date  . Abnormality of gait 09/07/2015  . Anemia, iron deficiency 09/16/2014  . Anxiety   . CAD (coronary artery disease)    a. 08/2014 NSTEMI/Cath: mild Ca2+ or RCA ostium, otw nl cors. CO 3.3 L/min (thermo), 3.7 L/min (Fick).  . Carotid arterial disease ([place]) 07/30/2012   carotid doppler; normal study  . Chronic anticoagulation, with coumadin, with PAF and CHADS2Vasc2 score of 4 09/16/2014  . Degenerative arthritis   . Diverticulosis   . Essential hypertension   . Foot fracture, left   . GERD (gastroesophageal reflux disease)   . History of nuclear stress test 09/19/2007   normal pattern of perfusion; post-stress EF 86%; EKG negative for ischemia; low risk scan  . Hyperlipidemia   . Left carotid bruit    a. 07/2015 Carotid U/S: 1-39% bilat ICA stenosis.  . Memory disorder 03/05/2014  . Mild renal insufficiency   . Mitral prolapse   . Neuropathy   . PAF (paroxysmal atrial fibrillation) (HCC)    a. 08/2014-->Coumadin (CHA2DS2VASc = 4).  . Peripheral neuropathy    Small fiber   . Pre-syncope    a. 04/2015 in setting of bradycardia-->CCB/BB doses adjusted.  . Pulmonary hypertension ([place])   . Valvular heart disease    a. 04/2015 Echo: EF 65-70%, no rwma, Gr1 DD, mild AS, triv AI, mild MS, mod MR, mod dil LA, mod TR, sev increased PASP.    Past Surgical History:  Procedure Laterality Date  .  Bilateral  bunionectomies    . ABDOMINAL HYSTERECTOMY    . APPENDECTOMY    . DILATION AND CURETTAGE OF UTERUS    . FOOT FRACTURE SURGERY Left   . gallbladder resection    . LEFT HEART CATHETERIZATION WITH CORONARY ANGIOGRAM N/A 09/14/2014   Procedure: LEFT HEART CATHETERIZATION WITH CORONARY ANGIOGRAM;  Surgeon: Troy Sine, MD;  Location: [facility] CATH LAB;  Service: Cardiovascular;  Laterality: N/A;  . REPLACEMENT TOTAL KNEE Right   . resuspension procedure    . ROBOTIC ASSISTED BILATERAL SALPINGO OOPHERECTOMY Bilateral 02/19/2017   Procedure: XI  ROBOTIC ASSISTED BILATERAL SALPINGO OOPHORECTOMY AND LYSIS OF ADHESION;  Surgeon: Everitt Amber, MD;  Location: [facility] ORS;  Service: Gynecology;  Laterality: Bilateral;  . TEAR DUCT PROBING WITH STRABISMUS REPAIR     Tear duct repair surgery  . TONSILLECTOMY    . UMBILICAL HERNIA REPAIR      Current Medications: Outpatient Medications Prior to Visit  Medication Sig Dispense Refill  . acetaminophen (TYLENOL) 500 MG tablet Take 500 mg by mouth every 6 (six) hours as needed for moderate pain.    Marland Kitchen atorvastatin (LIPITOR) 20 MG tablet Take 0.5 tablets (10 mg total) by mouth at bedtime. 90 tablet 2  . Calcium Carb-Cholecalciferol (CALCIUM 600+D) 600-800 MG-UNIT TABS Take 1 tablet by mouth daily.    . carvedilol (COREG) 3.125 MG tablet TAKE 1 TABLET (3.125 MG TOTAL) BY MOUTH 2 (TWO) TIMES DAILY. 180 tablet 2  . cephALEXin (KEFLEX) 500 MG capsule     . cetirizine (ZYRTEC) 10 MG tablet Take 10 mg by mouth daily with breakfast.     . cholecalciferol (VITAMIN D) 1000 UNITS tablet Take 1,000 Units by mouth daily.      . colestipol (COLESTID) 1 G tablet Take 1 g by mouth 3 (three) times daily. For diarrhea and does not take at the same time as coumadin    . cycloSPORINE (RESTASIS) 0.05 % ophthalmic emulsion Place 1 drop into both eyes 2 (two) times daily.     Marland Kitchen diltiazem (CARDIZEM SR) 60 MG 12 hr capsule Take 1 capsule (60 mg total) 2 (two) times daily by mouth. 180 capsule 0  . Emollient (AVEENO ADVANCED CARE EX) Apply 1 application topically 2 (two) times daily.    . fenofibrate micronized (LOFIBRA) 134 MG capsule     . Flaxseed, Linseed, (FLAX SEED OIL) 1000 MG CAPS Take 1 capsule by mouth 3 (three) times daily.    . fluticasone (FLONASE) 50 MCG/ACT nasal spray Place 2 sprays into both nostrils 2 (two) times daily.    Marland Kitchen gabapentin (NEURONTIN) 300 MG capsule TAKE 1 CAPSULE THREE TIMES DAILY AND 3 CAPSULES AT BEDTIME (Patient taking differently: Take 300 mg in the morning and at lunch, then take 900 mg at  bedtime) 540 capsule 1  . hydrALAZINE (APRESOLINE) 25 MG tablet Take 1 tablet (25 mg total) by mouth 3 (three) times daily. 270 tablet 1  . hydrochlorothiazide (HYDRODIURIL) 25 MG tablet Take 1 tablet (25 mg total) daily by mouth. 90 tablet 3  . LACTOBACILLUS RHAMNOSUS, GG, PO Take 1 capsule by mouth daily.    Marland Kitchen levETIRAcetam (KEPPRA) 250 MG tablet TAKE 1 TABLET BY MOUTH TWICE DAILY 60 tablet 3  . loperamide (IMODIUM A-D) 2 MG tablet Take 2 mg by mouth daily with breakfast.    . memantine (NAMENDA) 10 MG tablet Take 1 tablet (10 mg total) by mouth 2 (two) times daily. 180 tablet 3  . Multiple Vitamin (MULTIVITAMIN) tablet Take 1 tablet  by mouth daily.      Marland Kitchen OVER THE COUNTER MEDICATION Take 1 capsule by mouth 3 (three) times daily. *IBgard*    . pantoprazole (PROTONIX) 40 MG tablet Take 40 mg by mouth daily.     Marland Kitchen pyridOXINE (VITAMIN B-6) 100 MG tablet Take 100 mg by mouth daily.    . ranitidine (ZANTAC) 150 MG tablet Take 150 mg by mouth 2 (two) times daily.     . simethicone (MYLICON) 147 MG chewable tablet Chew 125 mg by mouth as needed for flatulence.    . vitamin B-12 (CYANOCOBALAMIN) 1000 MCG tablet Take 1,000 mcg by mouth daily.    Marland Kitchen warfarin (COUMADIN) 2 MG tablet TAKE 1 AND 1/2 TO 2 TABLETS DAILY AS DIRECTED BY COUMADIN CLINIC 180 tablet 1  . mirtazapine (REMERON) 7.5 MG tablet      No facility-administered medications prior to visit.      Allergies:   Avelox [moxifloxacin hcl in nacl]; Moxifloxacin; Aricept [donepezil hcl]; Codeine; Donepezil; Garlic; Latex; Onion; Sulfa drugs cross reactors; Other; Duloxetine; and Polysporin [bacitracin-polymyxin b]   Social History   Socioeconomic History  . Marital status: Widowed    Spouse name: None  . Number of children: 2  . Years of education: AS  . Highest education level: None  Social Needs  . Financial resource strain: None  . Food insecurity - worry: None  . Food insecurity - inability: None  . Transportation needs - medical:  None  . Transportation needs - non-medical: None  Occupational History  . Occupation: Retired  Tobacco Use  . Smoking status: Former Smoker    Types: Cigarettes  . Smokeless tobacco: Never Used  . Tobacco comment: quit many mnay years ago   Substance and Sexual Activity  . Alcohol use: No  . Drug use: No  . Sexual activity: No  Other Topics Concern  . None  Social History Narrative   Resides at [facility]   Patient is left handed, but uses right handed.   Patient drinks one cup caffeine daily.     Family History:  The patient's family history includes Arthritis in her sister; Cancer in her father; Coronary artery disease in her mother; Heart disease in her father; Hypertension in her father and mother; Pneumonia in her mother.   ROS:   Please see the history of present illness.    ROS All other systems reviewed and are negative.   PHYSICAL EXAM:   VS:  BP 139/74   Pulse 74   Ht _0  (1.575 m)   Wt 113 lb 6.4 oz (51.4 kg)   BMI 20.74 kg/m    GEN: Well nourished, well developed, in no acute distress  HEENT: normal  Neck: no JVD, carotid bruits, or masses Cardiac: RRR; no rubs, or gallops,no edema  3/6 systolic murmur at apex Respiratory:  clear to auscultation bilaterally, normal work of breathing GI: soft, nontender, nondistended, + BS MS: no deformity or atrophy  Skin: warm and dry, no rash Neuro:  Alert and Oriented x 3, Strength and sensation are intact Psych: euthymic mood, full affect  Wt Readings from Last 3 Encounters:  08/08/17 113 lb 6.4 oz (51.4 kg)  06/06/17 115 lb (52.2 kg)  04/03/17 116 lb (52.6 kg)      Studies/Labs Reviewed:   EKG:  EKG is not ordered today.    Recent Labs: 12/10/2016: TSH 1.95 02/13/2017: ALT 16; BUN 25; Creatinine, Ser 0.83; Hemoglobin 11.2; Platelets 350; Potassium 4.3; Sodium 139  Lipid Panel    Component Value Date/Time   CHOL 111 12/11/2016 0707   TRIG 71 12/11/2016 0707   HDL 66 12/11/2016 0707   CHOLHDL  1.7 12/11/2016 0707   VLDL 14 12/11/2016 0707   LDLCALC 31 12/11/2016 0707    Additional studies/ records that were reviewed today include:   Cath 09/14/2014 HEMODYNAMICS:   RA: a 6 mean 3 RV: 30/7 PA: 30/9 PC: a 10  V 14 mean 10  AO: 161/57 PA: 33 12  LV: 152/11 PC: 10  LV: 151/11 AO: 151/50  Oxygen saturation in the aorta 98% and the pulmonary artery 64%  Cardiac output: 3.3 l/min (Thermo); 3.7 (Fick)  Cardiac index: 2.1 l/m/m2                2.3  ANGIOGRAPHY:   Fluoroscopy revealed severe mitral annular calcification and very mild calcification in the region of the aortic valve  Left main: Normal vessel which trifurcated into an LAD, and intermediate vessel, and a small left circumflex system.  LAD: Angiographically normal vessel which gave rise to a proximal bifurcating diagonal branch.  There was a mild region of an intramyocardial LAD segment without systolic bridging.  The LAD rise to several septal perforating arteries and extended to the apex.  Ramus intermediate: Angiographically normal small vessel.   Left circumflex: Angiographically normal small vessel which gave rise to one marginal branch.   Right coronary artery: There was evidence for calcification in the region of the RCA ostium without stenosis.  The RCA was a dominant vessel which was free of significant disease and gave rise to PDA and PLA branch.  Left ventriculography  revealed hyperdynamic LV function with an ejection fraction of approximately 70% with near mid cavity obliteration.  There was severe mitral annular calcification with 3+ mitral regurgitation.   IMPRESSION:  Hyperdynamic LV function with an ejection fraction of approximately 70%.  Severe mitral annular calcification with 3+ mitral regurgitation.  Mild calcification in the region of the RCA ostium, but otherwise normal-appearing coronary arteries.  Borderline/mild pulmonary  hypertension.   RECOMMENDATION:  Medical therapy.    Echo 12/19/2016 LV EF: 60% -   65%  Study Conclusions  - Left ventricle: The cavity size was normal. Wall thickness was   increased in a pattern of mild LVH. Systolic function was normal.   The estimated ejection fraction was in the range of 60% to 65%.   Wall motion was normal; there were no regional wall motion   abnormalities. Features are consistent with a pseudonormal left   ventricular filling pattern, with concomitant abnormal relaxation   and increased filling pressure (grade 2 diastolic dysfunction).   Doppler parameters are consistent with high ventricular filling   pressure. - Aortic valve: Valve mobility was restricted. There was mild   stenosis. There was trivial regurgitation. - Mitral valve: Calcified annulus. There was severe regurgitation. - Left atrium: The atrium was severely dilated. - Pulmonary arteries: Systolic pressure was severely increased. PA   peak pressure: 64 mm Hg (S).  Impressions:  - Normal LV systolic function; moderate diastolic dysfunction with   elevated LV filling pressure; calcified aortic valve with mild AS   and trace AI; severe MAC with severe MR; severe LAE; mild to   moderate TR with severely elevated pulmonary pressure.    ASSESSMENT:    1. PAF (paroxysmal atrial fibrillation) ([place])   2. Severe mitral regurgitation   3. Carotid artery disease, unspecified laterality, unspecified type ([place])   4.  Essential hypertension   5. Hyperlipidemia, unspecified hyperlipidemia type      PLAN:  In order of problems listed above:  1. PAF: recent heart monitor showed no evidence of afib. We had a long discussion regarding benefit and risk of coumadin, she says although she did have 2 falls in the past several years, both occurred because she was either carrying objects or tripped over something. She denies any dizziness. She understand while on the coumadin, she will have  increased risk of bleeding, however she is also very fearful of stroke as well. We eventually agreed as long as she is not having any worsening imbalance issue, she will continue the coumadin for the time being.   2. Severe MR: not a candidate for repair given her advanced age and frailty  3. Carotid artery disease: Last carotid ultrasound in 2016 showed mild disease.  4. Hypertension: Blood pressure very well controlled.  5. Hyperlipidemia: Continue Lipitor 10 mg daily    Medication Adjustments/Labs and Tests Ordered: Current medicines are reviewed at length with the patient today.  Concerns regarding medicines are outlined above.  Medication changes, Labs and Tests ordered today are listed in the Patient Instructions below. Patient Instructions  Almyra Deforest, PA-C, recommends that you continue on your current medications as directed. Please refer to the Current Medication list given to you today.  Your physician recommends that you schedule a follow-up appointment in 3 months with Dr Claiborne Billings.  If you need a refill on your cardiac medications before your next appointment, please call your pharmacy.    Hilbert Corrigan, [place]  08/10/2017 1:05 PM    [facility] [address], [place], [place]  [address] Phone: [phone]; Fax: [phone]

## 2017-08-08 NOTE — Patient Instructions (Signed)
Almyra Deforest, PA-C, recommends that you continue on your current medications as directed. Please refer to the Current Medication list given to you today.  Your physician recommends that you schedule a follow-up appointment in 3 months with Dr Claiborne Billings.  If you need a refill on your cardiac medications before your next appointment, please call your pharmacy.

## 2017-08-10 ENCOUNTER — Encounter: Payer: Self-pay | Admitting: Physician Assistant

## 2017-08-15 ENCOUNTER — Telehealth: Payer: Self-pay | Admitting: Physician Assistant

## 2017-08-15 ENCOUNTER — Ambulatory Visit (INDEPENDENT_AMBULATORY_CARE_PROVIDER_SITE_OTHER): Payer: Medicare Other | Admitting: Pharmacist Clinician (PhC)/ Clinical Pharmacy Specialist

## 2017-08-15 DIAGNOSIS — I480 Paroxysmal atrial fibrillation: Secondary | ICD-10-CM | POA: Diagnosis not present

## 2017-08-15 DIAGNOSIS — Z7901 Long term (current) use of anticoagulants: Secondary | ICD-10-CM | POA: Diagnosis not present

## 2017-08-15 LAB — PROTIME-INR: INR: 1.8 — AB (ref 0.9–1.1)

## 2017-08-15 NOTE — Telephone Encounter (Signed)
The patient called the answering service after-hours today, wanted to clarify dose of Coumadin. She wrote down 3 tablets on Thursday but wasn't sure what other instructions were. Per anticoag note, "Increase dose to 2 tablets daily except 3 tablets each Thursday." Pt wrote this down and read it back to me. She verbalized understanding and gratitude. Emy Angevine PA-C

## 2017-08-28 ENCOUNTER — Telehealth: Payer: Self-pay | Admitting: Cardiovascular Disease

## 2017-08-28 NOTE — Telephone Encounter (Signed)
New Message  Pt c/o medication issue:  1. Name of Medication: Diltiazem  2. How are you currently taking this medication (dosage and times per day)? 60mg   3. Are you having a reaction (difficulty breathing--STAT)? No   4. What is your medication issue? Per pt will be out of medication tomorrow and states the pharmacy is on back order pt would like to know if there is an alternate medication she would be able to take until she is able to get her medication refilled. Pt did request samples of this medication as well.

## 2017-08-28 NOTE — Telephone Encounter (Signed)
LMOM stating that we do not have samples of Diltiazem. Suggested that she contact another pharmacy to obtain a refill.

## 2017-08-28 NOTE — Telephone Encounter (Signed)
Will forward to Pharm D for review  

## 2017-08-28 NOTE — Telephone Encounter (Signed)
New Message  Patient calling the office for samples of medication:   1.  What medication and dosage are you requesting samples for? Diltiazem 60mg    2.  Are you currently out of this medication? Per pt will be out tomorrow. The pharmacy Is on back order

## 2017-08-29 ENCOUNTER — Ambulatory Visit (INDEPENDENT_AMBULATORY_CARE_PROVIDER_SITE_OTHER): Payer: Medicare Other | Admitting: Pharmacist Clinician (PhC)/ Clinical Pharmacy Specialist

## 2017-08-29 DIAGNOSIS — I480 Paroxysmal atrial fibrillation: Secondary | ICD-10-CM

## 2017-08-29 DIAGNOSIS — Z7901 Long term (current) use of anticoagulants: Secondary | ICD-10-CM

## 2017-08-29 LAB — PROTIME-INR: INR: 1.5 — AB (ref 0.9–1.1)

## 2017-08-29 MED ORDER — DILTIAZEM HCL ER COATED BEADS 120 MG PO CP24: 120.0000 mg | ORAL_CAPSULE | Freq: Every day | ORAL | 3 refills | Status: DC

## 2017-08-29 NOTE — Telephone Encounter (Signed)
Called to local [facility] pharmacy, they do not have SR 60 mg diltiazem either - is on backorder until sometime in Feb.  Patient take 60 mg bid.   Will switch her to diltiazem CD 120 mg once daily in the evenings (she took last dose of 60 mg this am).  Patient voiced understanding and will have son pick this up today

## 2017-09-04 ENCOUNTER — Other Ambulatory Visit: Payer: Self-pay | Admitting: *Deleted

## 2017-09-11 ENCOUNTER — Encounter: Payer: Self-pay | Admitting: Podiatry

## 2017-09-11 ENCOUNTER — Ambulatory Visit (INDEPENDENT_AMBULATORY_CARE_PROVIDER_SITE_OTHER): Payer: Medicare Other | Admitting: Podiatry

## 2017-09-11 DIAGNOSIS — M79676 Pain in unspecified toe(s): Secondary | ICD-10-CM | POA: Diagnosis not present

## 2017-09-11 DIAGNOSIS — L84 Corns and callosities: Secondary | ICD-10-CM

## 2017-09-11 DIAGNOSIS — B351 Tinea unguium: Secondary | ICD-10-CM

## 2017-09-11 DIAGNOSIS — M79675 Pain in left toe(s): Principal | ICD-10-CM

## 2017-09-11 DIAGNOSIS — M76829 Posterior tibial tendinitis, unspecified leg: Secondary | ICD-10-CM

## 2017-09-11 DIAGNOSIS — D689 Coagulation defect, unspecified: Secondary | ICD-10-CM

## 2017-09-11 NOTE — Progress Notes (Signed)
Patient ID: [name], female   DOB: Jun 16, 1922, 84 y.o.   MRN: [id number] Complaint:  Visit Type: Patient returns to my office for continued preventative foot care services. Complaint: Patient states" my nails have grown long and thick and become painful to walk and wear shoes" Patient has been diagnosed with neuropathy.  . She presents to the office for preventive foot care services. She says she has developed a painful callus under right big toe .    Podiatric Exam: Vascular: dorsalis pedis and posterior tibial pulses are palpable bilateral. Capillary return is immediate. Temperature gradient is WNL. Skin turgor WNL  Sensorium: Normal Semmes Weinstein monofilament test. Normal tactile sensation bilaterally. Nail Exam: Pt has thick disfigured discolored nails with subungual debris noted bilateral entire nail hallux through fifth toes both feet. Ulcer Exam: There is no evidence of ulcer or pre-ulcerative changes or infection. Orthopedic Exam: Muscle tone and strength are WNL. No limitations in general ROM. No crepitus or effusions noted. Foot type and digits show no abnormalities. Bony prominences are unremarkable.Severe dorsiflexed second toe right foot. Severe HAV deformity right foot. Skin: No Porokeratosis. No infection or ulcers.  Pre-ulcerous lesion right foot.  Diagnosis:  Tinea unguium, Pain in right toe, pain in left toes,  Preulcerous skin lesion right foot.  Treatment & Plan Procedures and Treatment: Consent by patient was obtained for treatment procedures. The patient understood the discussion of treatment and procedures well. All questions were answered thoroughly reviewed. Debridement of mycotic and hypertrophic toenails, 1 through 5 bilateral and clearing of subungual debris. No ulceration, no infection noted. Debridement of callus sub 1 right foot. Patient to talk with Liliane Channel about her orthoses.  He will make adjustment and check into new shoes. Return Visit-Office Procedure:  Patient instructed to return to the office for a follow up visit 9 weeks for continued evaluation and treatment.   Gardiner Barefoot DPM

## 2017-09-16 ENCOUNTER — Ambulatory Visit (INDEPENDENT_AMBULATORY_CARE_PROVIDER_SITE_OTHER): Payer: Medicare Other | Admitting: Pharmacist

## 2017-09-16 DIAGNOSIS — I480 Paroxysmal atrial fibrillation: Secondary | ICD-10-CM

## 2017-09-16 DIAGNOSIS — Z7901 Long term (current) use of anticoagulants: Secondary | ICD-10-CM | POA: Diagnosis not present

## 2017-09-16 LAB — PROTIME-INR: INR: 1.8 — AB (ref 0.9–1.1)

## 2017-09-25 DIAGNOSIS — R634 Abnormal weight loss: Secondary | ICD-10-CM | POA: Diagnosis not present

## 2017-09-26 ENCOUNTER — Ambulatory Visit: Payer: Medicare Other | Admitting: Orthotics

## 2017-09-26 DIAGNOSIS — L84 Corns and callosities: Secondary | ICD-10-CM

## 2017-09-26 DIAGNOSIS — M76829 Posterior tibial tendinitis, unspecified leg: Secondary | ICD-10-CM

## 2017-09-26 NOTE — Progress Notes (Signed)
conf [id number]  [facility] footwear reordered [id number] due to previous being on backorder.

## 2017-09-30 ENCOUNTER — Ambulatory Visit (INDEPENDENT_AMBULATORY_CARE_PROVIDER_SITE_OTHER): Payer: Medicare Other | Admitting: Pharmacist Clinician (PhC)/ Clinical Pharmacy Specialist

## 2017-09-30 DIAGNOSIS — Z7901 Long term (current) use of anticoagulants: Secondary | ICD-10-CM

## 2017-09-30 DIAGNOSIS — I4891 Unspecified atrial fibrillation: Secondary | ICD-10-CM | POA: Diagnosis not present

## 2017-09-30 DIAGNOSIS — I480 Paroxysmal atrial fibrillation: Secondary | ICD-10-CM

## 2017-09-30 LAB — PROTIME-INR: INR: 1.8 — AB (ref 0.9–1.1)

## 2017-10-02 ENCOUNTER — Ambulatory Visit: Payer: Medicare Other | Admitting: Orthotics

## 2017-10-02 DIAGNOSIS — M76829 Posterior tibial tendinitis, unspecified leg: Secondary | ICD-10-CM

## 2017-10-02 DIAGNOSIS — L84 Corns and callosities: Secondary | ICD-10-CM

## 2017-10-02 NOTE — Progress Notes (Signed)
.  reordered [facility] [id number] and 3e  [phone]

## 2017-10-08 DIAGNOSIS — Z01419 Encounter for gynecological examination (general) (routine) without abnormal findings: Secondary | ICD-10-CM | POA: Diagnosis not present

## 2017-10-08 DIAGNOSIS — N83202 Unspecified ovarian cyst, left side: Secondary | ICD-10-CM | POA: Diagnosis not present

## 2017-10-08 DIAGNOSIS — Z6822 Body mass index (BMI) 22.0-22.9, adult: Secondary | ICD-10-CM | POA: Diagnosis not present

## 2017-10-09 ENCOUNTER — Ambulatory Visit (INDEPENDENT_AMBULATORY_CARE_PROVIDER_SITE_OTHER): Payer: Medicare Other | Admitting: Neurology

## 2017-10-09 ENCOUNTER — Ambulatory Visit: Payer: Medicare Other | Admitting: Orthotics

## 2017-10-09 ENCOUNTER — Encounter: Payer: Self-pay | Admitting: Neurology

## 2017-10-09 VITALS — BP 125/64 | HR 65 | Ht 62.0 in | Wt 114.0 lb

## 2017-10-09 DIAGNOSIS — R413 Other amnesia: Secondary | ICD-10-CM

## 2017-10-09 DIAGNOSIS — R269 Unspecified abnormalities of gait and mobility: Secondary | ICD-10-CM

## 2017-10-09 DIAGNOSIS — G63 Polyneuropathy in diseases classified elsewhere: Secondary | ICD-10-CM | POA: Diagnosis not present

## 2017-10-09 DIAGNOSIS — M76829 Posterior tibial tendinitis, unspecified leg: Secondary | ICD-10-CM

## 2017-10-09 MED ORDER — LEVETIRACETAM 250 MG PO TABS: ORAL_TABLET | ORAL | 5 refills | Status: DC

## 2017-10-09 NOTE — Progress Notes (Signed)
Tried several OTS options for footwear; due to foot deformity, all are uncomfortable.  Will go with [place]

## 2017-10-09 NOTE — Patient Instructions (Signed)
   With the Keppra 250 mg tablet, take one in the morning and 2 in the evening.

## 2017-10-09 NOTE — Progress Notes (Signed)
Reason for visit: Peripheral neuropathy  [name] is an 84 y.o. female  History of present illness:  [name] is a 84 year old right-handed white female with a history of a peripheral neuropathy associated with a gait disorder.  She uses a cane for ambulation, she has not had any falls since last seen.  She does have a lot of arthritic conditions affecting her feet, she is getting a special pair of shoes made for her if that will help prevent blisters on her feet.  The patient is having discomfort in her feet day and night, she sleeps well at night, however.  She is on gabapentin and she takes low-dose Keppra 250 mg twice daily.  The patient is tolerating the medication well, but it has not completely eliminated her discomfort.  She reports no big change in her memory since last seen.  She is living in an independent living apartment.  She functions fairly well.  Past Medical History:  Diagnosis Date  . Abnormality of gait 09/07/2015  . Anemia, iron deficiency 09/16/2014  . Anxiety   . CAD (coronary artery disease)    a. 08/2014 NSTEMI/Cath: mild Ca2+ or RCA ostium, otw nl cors. CO 3.3 L/min (thermo), 3.7 L/min ([facility]ick).  . Carotid arterial disease ([place]) 07/30/2012   carotid doppler; normal study  . Chronic anticoagulation, with coumadin, with PAF and CHADS2Vasc2 score of 4 09/16/2014  . Degenerative arthritis   . Diverticulosis   . Essential hypertension   . Foot fracture, left   . GERD (gastroesophageal reflux disease)   . History of nuclear stress test 09/19/2007   normal pattern of perfusion; post-stress EF 86%; EKG negative for ischemia; low risk scan  . Hyperlipidemia   . Left carotid bruit    a. 07/2015 Carotid U/S: 1-39% bilat ICA stenosis.  . Memory disorder 03/05/2014  . Mild renal insufficiency   . Mitral prolapse   . Neuropathy   . PAF (paroxysmal atrial fibrillation) (HCC)    a. 08/2014-->Coumadin (CHA2DS2VASc = 4).  . Peripheral neuropathy    Small fiber     . Pre-syncope    a. 04/2015 in setting of bradycardia-->CCB/BB doses adjusted.  . Pulmonary hypertension ([place])   . Valvular heart disease    a. 04/2015 Echo: EF 65-70%, no rwma, Gr1 DD, mild AS, triv AI, mild MS, mod MR, mod dil LA, mod TR, sev increased PASP.    Past Surgical History:  Procedure Laterality Date  .  Bilateral bunionectomies    . ABDOMINAL HYSTERECTOMY    . APPENDECTOMY    . DILATION AND CURETTAGE OF UTERUS    . FOOT FRACTURE SURGERY Left   . gallbladder resection    . LEFT HEART CATHETERIZATION WITH CORONARY ANGIOGRAM N/A 09/14/2014   Procedure: LEFT HEART CATHETERIZATION WITH CORONARY ANGIOGRAM;  Surgeon: Troy Sine, MD;  Location: [facility] CATH LAB;  Service: Cardiovascular;  Laterality: N/A;  . REPLACEMENT TOTAL KNEE Right   . resuspension procedure    . ROBOTIC ASSISTED BILATERAL SALPINGO OOPHERECTOMY Bilateral 02/19/2017   Procedure: XI ROBOTIC ASSISTED BILATERAL SALPINGO OOPHORECTOMY AND LYSIS OF ADHESION;  Surgeon: Everitt Amber, MD;  Location: [facility] ORS;  Service: Gynecology;  Laterality: Bilateral;  . TEAR DUCT PROBING WITH STRABISMUS REPAIR     Tear duct repair surgery  . TONSILLECTOMY    . UMBILICAL HERNIA REPAIR      Family History  Problem Relation Age of Onset  . Pneumonia Mother   . Coronary artery disease Mother   .  Hypertension Mother   . Heart disease Father   . Cancer Father   . Hypertension Father   . Arthritis Sister     Social history:  reports that she has quit smoking. Her smoking use included cigarettes. she has never used smokeless tobacco. She reports that she does not drink alcohol or use drugs.    Allergies  Allergen Reactions  . Avelox [Moxifloxacin Hcl In Nacl] Shortness Of Breath and Swelling  . Moxifloxacin Other (See Comments), Shortness Of Breath and Swelling    other  . Aricept [Donepezil Hcl] Diarrhea  . Codeine Swelling       . Donepezil Diarrhea  . Garlic Other (See Comments)    Gi problems  . Latex Hives, Itching,  Rash and Other (See Comments)    Watery blisters   . Onion Other (See Comments)    Gi problems  . Sulfa Drugs Cross Reactors Swelling  . Other Other (See Comments)    Her throat closes up  . Duloxetine Rash  . Polysporin [Bacitracin-Polymyxin B] Other (See Comments)    other    Medications:  Prior to Admission medications   Medication Sig Start Date End Date Taking? Authorizing Provider  acetaminophen (TYLENOL) 500 MG tablet Take 500 mg by mouth every 6 (six) hours as needed for moderate pain.   Yes [provider]  atorvastatin (LIPITOR) 20 MG tablet Take 0.5 tablets (10 mg total) by mouth at bedtime. 06/06/17  Yes Troy Sine, MD  Calcium Carb-Cholecalciferol (CALCIUM 600+D) 600-800 MG-UNIT TABS Take 1 tablet by mouth daily.   Yes [provider]  carvedilol (COREG) 3.125 MG tablet TAKE 1 TABLET (3.125 MG TOTAL) BY MOUTH 2 (TWO) TIMES DAILY. 06/20/17  Yes Troy Sine, MD  cephALEXin [facility]) 500 MG capsule  06/27/17  Yes [provider]  cetirizine (ZYRTEC) 10 MG tablet Take 10 mg by mouth daily with breakfast.    Yes [provider]  cholecalciferol (VITAMIN D) 1000 UNITS tablet Take 1,000 Units by mouth daily.     Yes [provider]  colestipol (COLESTID) 1 G tablet Take 1 g by mouth 3 (three) times daily. For diarrhea and does not take at the same time as coumadin   Yes [provider]  cycloSPORINE (RESTASIS) 0.05 % ophthalmic emulsion Place 1 drop into both eyes 2 (two) times daily.    Yes [provider]  diltiazem (CARDIZEM CD) 120 MG 24 hr capsule Take 1 capsule (120 mg total) by mouth daily. 08/29/17 11/27/17 Yes Troy Sine, MD  Emollient [facility] ADVANCED CARE EX) Apply 1 application topically 2 (two) times daily.   Yes [provider]  Flaxseed, Linseed, (FLAX SEED OIL) 1000 MG CAPS Take 1 capsule by mouth 3 (three) times daily.   Yes [provider]  fluticasone (FLONASE) 50 MCG/ACT  nasal spray Place 2 sprays into both nostrils 2 (two) times daily.   Yes [provider]  gabapentin (NEURONTIN) 300 MG capsule TAKE 1 CAPSULE THREE TIMES DAILY AND 3 CAPSULES AT BEDTIME Patient taking differently: Take 300 mg in the morning and at lunch, then take 900 mg at bedtime 11/03/15  Yes Dohmeier, Asencion Partridge, MD  hydrALAZINE (APRESOLINE) 25 MG tablet Take 1 tablet (25 mg total) by mouth 3 (three) times daily. 04/10/17  Yes Troy Sine, MD  hydrochlorothiazide (HYDRODIURIL) 25 MG tablet Take 1 tablet (25 mg total) daily by mouth. 07/02/17  Yes Troy Sine, MD  LACTOBACILLUS RHAMNOSUS, GG, PO Take 1  capsule by mouth daily.   Yes [provider]  levETIRAcetam (KEPPRA) 250 MG tablet One tablet in the morning and 2 in the evening 10/09/17  Yes Kathrynn Ducking, MD  loperamide (IMODIUM A-D) 2 MG tablet Take 2 mg by mouth daily with breakfast.   Yes [provider]  memantine (NAMENDA) 10 MG tablet Take 1 tablet (10 mg total) by mouth 2 (two) times daily. 12/20/16  Yes Kathrynn Ducking, MD  mirtazapine (REMERON) 7.5 MG tablet  08/07/17  Yes [provider]  Multiple Vitamin (MULTIVITAMIN) tablet Take 1 tablet by mouth daily.     Yes [provider]  OVER THE COUNTER MEDICATION Take 1 capsule by mouth 3 (three) times daily. *IBgard*   Yes [provider]  OVER THE COUNTER MEDICATION Take 1 Dose by mouth daily. IB Guard   Yes [provider]  pantoprazole (PROTONIX) 40 MG tablet Take 40 mg by mouth daily.  11/15/15  Yes [provider]  PEPPERMINT OIL PO Take 1 Dose by mouth 2 (two) times daily. 1 dose in the morning and at bedtime   Yes [provider]  pyridOXINE (VITAMIN B-6) 100 MG tablet Take 100 mg by mouth daily.   Yes [provider]  ranitidine (ZANTAC) 150 MG tablet Take 150 mg by mouth 2 (two) times daily.  07/26/16  Yes [provider]  simethicone (MYLICON) 536 MG chewable tablet Chew 125  mg by mouth as needed for flatulence.   Yes [provider]  vitamin B-12 (CYANOCOBALAMIN) 1000 MCG tablet Take 1,000 mcg by mouth daily.   Yes [provider]  warfarin (COUMADIN) 2 MG tablet TAKE 1 AND 1/2 TO 2 TABLETS DAILY AS DIRECTED BY COUMADIN CLINIC 07/16/17  Yes Croitoru, Mihai, MD    ROS:  Out of a complete 14 system review of symptoms, the patient complains only of the following symptoms, and all other reviewed systems are negative.  Decreased appetite, weight loss Ringing in the ears Cold intolerance Daytime sleepiness Joint pain, back pain, aching muscles, walking difficulty Itching Memory loss, numbness, speech difficulty  Blood pressure 125/64, pulse 65, height 5\' 2"  (1.575 m), weight 114 lb (51.7 kg).  Physical Exam  General: The patient is alert and cooperative at the time of the examination.  Skin: No significant peripheral edema is noted.   Neurologic Exam  Mental status: The patient is alert and oriented x 3 at the time of the examination. The patient has apparent normal recent and remote memory, with an apparently normal attention span and concentration ability.  Mini-Mental status examination done today shows a total score of 30/30.   Cranial nerves: Facial symmetry is present. Speech is normal, no aphasia or dysarthria is noted. Extraocular movements are full. Visual fields are full.  Motor: The patient has good strength in all 4 extremities.  Sensory examination: Soft touch sensation is symmetric on the face, arms, and legs.  Coordination: The patient has good finger-nose-finger and heel-to-shin bilaterally.  Gait and station: The patient has a slightly wide-based gait, the patient uses a cane for ambulation.  Tandem gait was not attempted Romberg is negative. No drift is seen.  Reflexes: Deep tendon reflexes are symmetric, but are depressed.   Assessment/Plan:  1.  Peripheral neuropathy  2.  Mild gait disorder  3.  Mild  memory disorder  The patient is still having some discomfort in the feet, some of her pain may be mechanical in nature associated with the degenerative arthritis  and structural changes of her feet.  The patient will go up on the Keppra taking 250 mg in the morning and 500 mg in the evening.  A prescription was sent in.  The patient will follow-up in 6 months.  Jill Alexanders MD 10/09/2017 12:24 PM  [facility] [address] [place],  [address]  Phone [phone] Fax [phone]

## 2017-10-14 ENCOUNTER — Ambulatory Visit (INDEPENDENT_AMBULATORY_CARE_PROVIDER_SITE_OTHER): Payer: Medicare Other | Admitting: Pharmacist Clinician (PhC)/ Clinical Pharmacy Specialist

## 2017-10-14 DIAGNOSIS — I480 Paroxysmal atrial fibrillation: Secondary | ICD-10-CM

## 2017-10-14 DIAGNOSIS — Z7901 Long term (current) use of anticoagulants: Secondary | ICD-10-CM | POA: Diagnosis not present

## 2017-10-14 DIAGNOSIS — I4891 Unspecified atrial fibrillation: Secondary | ICD-10-CM | POA: Diagnosis not present

## 2017-10-14 LAB — PROTIME-INR: INR: 3 — AB (ref 0.9–1.1)

## 2017-10-16 ENCOUNTER — Ambulatory Visit: Payer: Medicare Other | Admitting: Orthotics

## 2017-10-16 DIAGNOSIS — M76829 Posterior tibial tendinitis, unspecified leg: Secondary | ICD-10-CM

## 2017-10-16 DIAGNOSIS — L84 Corns and callosities: Secondary | ICD-10-CM

## 2017-10-16 DIAGNOSIS — D689 Coagulation defect, unspecified: Secondary | ICD-10-CM

## 2017-10-17 ENCOUNTER — Other Ambulatory Visit: Payer: Self-pay | Admitting: Cardiovascular Disease

## 2017-10-17 NOTE — Progress Notes (Signed)
Patient was cast today for b/l custom shoes to address structural deformity of foot, particularly right. She presents with hammertoes, callus, and crossover #2 over 3, RIGHT.    She is not diabetic. \  She chose [employer] [address] w/ wide forefoot and laces.  Advised it would be a charge of $450

## 2017-10-21 ENCOUNTER — Ambulatory Visit (INDEPENDENT_AMBULATORY_CARE_PROVIDER_SITE_OTHER): Payer: Medicare Other | Admitting: Adult Health

## 2017-10-21 ENCOUNTER — Telehealth: Payer: Self-pay | Admitting: Cardiovascular Disease

## 2017-10-21 ENCOUNTER — Encounter: Payer: Self-pay | Admitting: Adult Health

## 2017-10-21 VITALS — BP 115/62 | HR 58 | Ht 62.0 in | Wt 115.0 lb

## 2017-10-21 DIAGNOSIS — I340 Nonrheumatic mitral (valve) insufficiency: Secondary | ICD-10-CM

## 2017-10-21 DIAGNOSIS — R0600 Dyspnea, unspecified: Secondary | ICD-10-CM

## 2017-10-21 DIAGNOSIS — R0609 Other forms of dyspnea: Secondary | ICD-10-CM

## 2017-10-21 DIAGNOSIS — I358 Other nonrheumatic aortic valve disorders: Secondary | ICD-10-CM

## 2017-10-21 MED ORDER — HYDRALAZINE HCL 25 MG PO TABS: 25.0000 mg | ORAL_TABLET | Freq: Three times a day (TID) | ORAL | 3 refills | Status: DC

## 2017-10-21 NOTE — Progress Notes (Signed)
Cardiology Office Note   Date:  10/21/2017   ID:  [name], DOB 03-Jun-1922, MRN [id number]  PCP:  Deland Pretty, MD  Cardiologist:   [facility] Chief Complaint  Patient presents with  . Shortness of Breath  . Fatigue  . Chest Pain  . Mitral Stenosis  . Aortic Stenosis     History of Present Illness: [name] is a 84 y.o. female who presents for complaints of chest pain which were described as "quite intense lasting several hours last week "late Thursday and early Friday morning" she also complained of more noticeable dyspnea.  The patient lives in a small home on a community associated with assisted-living, and SNF. She usually walks out to the dining room for dinner which takes her about 10 minutes. On the day she had symptoms, she stated that she had to stop and catch her breath several times, felt her heart pounding, and felt near syncope. When she made it to the dining room, and sat down to eat with her friend, symptoms improved. Still mildly short of breath however. She then walked back to her home and again was very short of breath and felt her heart pounding. When she returned home she went straight to bed as she was exhausted.  She has a history of paroxysmal atrial fibrillation, severe mitral regurgitation, aortic stenosis (the moderate) carotid artery disease, hypertension, and hyperlipidemia. The patient had a cardiac catheterization 09/14/2014 revealing mild calcification in the ostial RCA otherwise essentially normal coronaries. She was found to have severe 3+ MR.   Given her advanced age her severe MR was treated medically. Repeat monitor revealed daily sinus rhythm without significant prolonged pauses or recurrence of atrial fibrillation. She was continued on Coumadin therapy as she had not had any recent major falls  She is very independent and prefers not and others to provide her with assistance. But she has chosen to have her meals delivered to her home so  that she does not have to walk up to the dining room exhausting to her.  Past Medical History:  Diagnosis Date  . Abnormality of gait 09/07/2015  . Anemia, iron deficiency 09/16/2014  . Anxiety   . CAD (coronary artery disease)    a. 08/2014 NSTEMI/Cath: mild Ca2+ or RCA ostium, otw nl cors. CO 3.3 L/min (thermo), 3.7 L/min ([place]).  . Carotid arterial disease ([place]) 07/30/2012   carotid doppler; normal study  . Chronic anticoagulation, with coumadin, with PAF and CHADS2Vasc2 score of 4 09/16/2014  . Degenerative arthritis   . Diverticulosis   . Essential hypertension   . Foot fracture, left   . GERD (gastroesophageal reflux disease)   . History of nuclear stress test 09/19/2007   normal pattern of perfusion; post-stress EF 86%; EKG negative for ischemia; low risk scan  . Hyperlipidemia   . Left carotid bruit    a. 07/2015 Carotid U/S: 1-39% bilat ICA stenosis.  . Memory disorder 03/05/2014  . Mild renal insufficiency   . Mitral prolapse   . Neuropathy   . PAF (paroxysmal atrial fibrillation) (HCC)    a. 08/2014-->Coumadin (CHA2DS2VASc = 4).  . Peripheral neuropathy    Small fiber   . Pre-syncope    a. 04/2015 in setting of bradycardia-->CCB/BB doses adjusted.  . Pulmonary hypertension ([place])   . Valvular heart disease    a. 04/2015 Echo: EF 65-70%, no rwma, Gr1 DD, mild AS, triv AI, mild MS, mod MR, mod dil LA, mod TR, sev increased PASP.  Past Surgical History:  Procedure Laterality Date  .  Bilateral bunionectomies    . ABDOMINAL HYSTERECTOMY    . APPENDECTOMY    . DILATION AND CURETTAGE OF UTERUS    . FOOT FRACTURE SURGERY Left   . gallbladder resection    . LEFT HEART CATHETERIZATION WITH CORONARY ANGIOGRAM N/A 09/14/2014   Procedure: LEFT HEART CATHETERIZATION WITH CORONARY ANGIOGRAM;  Surgeon: Troy Sine, MD;  Location: [facility] CATH LAB;  Service: Cardiovascular;  Laterality: N/A;  . REPLACEMENT TOTAL KNEE Right   . resuspension procedure    . ROBOTIC ASSISTED BILATERAL  SALPINGO OOPHERECTOMY Bilateral 02/19/2017   Procedure: XI ROBOTIC ASSISTED BILATERAL SALPINGO OOPHORECTOMY AND LYSIS OF ADHESION;  Surgeon: Everitt Amber, MD;  Location: [facility] ORS;  Service: Gynecology;  Laterality: Bilateral;  . TEAR DUCT PROBING WITH STRABISMUS REPAIR     Tear duct repair surgery  . TONSILLECTOMY    . UMBILICAL HERNIA REPAIR       Current Outpatient Medications  Medication Sig Dispense Refill  . acetaminophen (TYLENOL) 500 MG tablet Take 500 mg by mouth every 6 (six) hours as needed for moderate pain.    Marland Kitchen atorvastatin (LIPITOR) 20 MG tablet Take 0.5 tablets (10 mg total) by mouth at bedtime. 90 tablet 2  . Calcium Carb-Cholecalciferol (CALCIUM 600+D) 600-800 MG-UNIT TABS Take 1 tablet by mouth daily.    . carvedilol (COREG) 3.125 MG tablet TAKE 1 TABLET (3.125 MG TOTAL) BY MOUTH 2 (TWO) TIMES DAILY. 180 tablet 2  . cephALEXin (KEFLEX) 500 MG capsule     . cetirizine (ZYRTEC) 10 MG tablet Take 10 mg by mouth daily with breakfast.     . cholecalciferol (VITAMIN D) 1000 UNITS tablet Take 1,000 Units by mouth daily.      . colestipol (COLESTID) 1 G tablet Take 1 g by mouth 3 (three) times daily. For diarrhea and does not take at the same time as coumadin    . cycloSPORINE (RESTASIS) 0.05 % ophthalmic emulsion Place 1 drop into both eyes 2 (two) times daily.     Marland Kitchen diltiazem (CARDIZEM CD) 120 MG 24 hr capsule Take 1 capsule (120 mg total) by mouth daily. 90 capsule 3  . Emollient (AVEENO ADVANCED CARE EX) Apply 1 application topically 2 (two) times daily.    . Flaxseed, Linseed, (FLAX SEED OIL) 1000 MG CAPS Take 1 capsule by mouth 3 (three) times daily.    . fluticasone (FLONASE) 50 MCG/ACT nasal spray Place 2 sprays into both nostrils 2 (two) times daily.    Marland Kitchen gabapentin (NEURONTIN) 300 MG capsule TAKE 1 CAPSULE THREE TIMES DAILY AND 3 CAPSULES AT BEDTIME (Patient taking differently: Take 300 mg in the morning and at lunch, then take 900 mg at bedtime) 540 capsule 1  . hydrALAZINE  (APRESOLINE) 25 MG tablet Take 1 tablet (25 mg total) by mouth 3 (three) times daily. 270 tablet 3  . hydrochlorothiazide (HYDRODIURIL) 25 MG tablet Take 1 tablet (25 mg total) daily by mouth. 90 tablet 3  . LACTOBACILLUS RHAMNOSUS, GG, PO Take 1 capsule by mouth daily.    Marland Kitchen levETIRAcetam (KEPPRA) 250 MG tablet One tablet in the morning and 2 in the evening 90 tablet 5  . loperamide (IMODIUM A-D) 2 MG tablet Take 2 mg by mouth daily with breakfast.    . memantine (NAMENDA) 10 MG tablet Take 1 tablet (10 mg total) by mouth 2 (two) times daily. 180 tablet 3  . mirtazapine (REMERON) 7.5 MG tablet     .  Multiple Vitamin (MULTIVITAMIN) tablet Take 1 tablet by mouth daily.      Marland Kitchen OVER THE COUNTER MEDICATION Take 1 capsule by mouth 3 (three) times daily. *IBgard*    . OVER THE COUNTER MEDICATION Take 1 Dose by mouth daily. IB Guard    . pantoprazole (PROTONIX) 40 MG tablet Take 40 mg by mouth daily.     Marland Kitchen PEPPERMINT OIL PO Take 1 Dose by mouth 2 (two) times daily. 1 dose in the morning and at bedtime    . pyridOXINE (VITAMIN B-6) 100 MG tablet Take 100 mg by mouth daily.    . ranitidine (ZANTAC) 150 MG tablet Take 150 mg by mouth 2 (two) times daily.     . simethicone (MYLICON) 353 MG chewable tablet Chew 125 mg by mouth as needed for flatulence.    . vitamin B-12 (CYANOCOBALAMIN) 1000 MCG tablet Take 1,000 mcg by mouth daily.    Marland Kitchen warfarin (COUMADIN) 2 MG tablet TAKE 1 AND 1/2 TO 2 TABLETS DAILY AS DIRECTED BY COUMADIN CLINIC 180 tablet 1   No current facility-administered medications for this visit.     Allergies:   Avelox [moxifloxacin hcl in nacl]; Moxifloxacin; Aricept [donepezil hcl]; Codeine; Donepezil; Garlic; Latex; Onion; Sulfa drugs cross reactors; Other; Duloxetine; and Polysporin [bacitracin-polymyxin b]    Social History:  The patient  reports that she has quit smoking. Her smoking use included cigarettes. she has never used smokeless tobacco. She reports that she does not drink  alcohol or use drugs.   Family History:  The patient's family history includes Arthritis in her sister; Cancer in her father; Coronary artery disease in her mother; Heart disease in her father; Hypertension in her father and mother; Pneumonia in her mother.    ROS: All other systems are reviewed and negative. Unless otherwise mentioned in H&P    PHYSICAL EXAM: VS:  BP 115/62   Pulse (!) 58   Ht _0  (1.575 m)   Wt 115 lb (52.2 kg)   BMI 21.03 kg/m  , BMI Body mass index is 21.03 kg/m. GEN: Well nourished, well developed, in no acute distress  HEENT: normal  Neck: no JVD, carotid bruits, or masses Cardiac: RRR; 3/6 holosystolic murmur heard best at the left sternal border, radiating into the apex and axilla,no rubs, or gallops,no edema also 2/6 systolic murmur heard at the left sternal border Respiratory:  Clear to auscultation bilaterally, normal work of breathing GI: soft, nontender, nondistended, + BS MS: no deformity or atrophy  Skin: warm and dry, no rash Neuro:  Strength and sensation are intact Psych: euthymic mood, full affect   EKG:  Sinus bradycardia with occasional sinus arrhythmia. Moderate LVH, with nonspecific ST abnormality noted in V3 and V1. Heart rate of 58 bpm.  Recent Labs: 12/10/2016: TSH 1.95 02/13/2017: ALT 16; BUN 25; Creatinine, Ser 0.83; Hemoglobin 11.2; Platelets 350; Potassium 4.3; Sodium 139    Lipid Panel    Component Value Date/Time   CHOL 111 12/11/2016 0707   TRIG 71 12/11/2016 0707   HDL 66 12/11/2016 0707   CHOLHDL 1.7 12/11/2016 0707   VLDL 14 12/11/2016 0707   LDLCALC 31 12/11/2016 0707      Wt Readings from Last 3 Encounters:  10/21/17 115 lb (52.2 kg)  10/09/17 114 lb (51.7 kg)  08/08/17 113 lb 6.4 oz (51.4 kg)      Other studies Reviewed:  Echocardiogram 01/08/2017 Left ventricle: The cavity size was normal. Wall thickness was   increased in a  pattern of mild LVH. Systolic function was normal.   The estimated ejection  fraction was in the range of 60% to 65%.   Wall motion was normal; there were no regional wall motion   abnormalities. Features are consistent with a pseudonormal left   ventricular filling pattern, with concomitant abnormal relaxation   and increased filling pressure (grade 2 diastolic dysfunction).   Doppler parameters are consistent with high ventricular filling   pressure. - Aortic valve: Valve mobility was restricted. There was mild   stenosis. There was trivial regurgitation. - Mitral valve: Calcified annulus. There was severe regurgitation. - Left atrium: The atrium was severely dilated. - Pulmonary arteries: Systolic pressure was severely increased. PA   peak pressure: 64 mm Hg (S).  Impressions:  - Normal LV systolic function; moderate diastolic dysfunction with   elevated LV filling pressure; calcified aortic valve with mild AS   and trace AI; severe MAC with severe MR; severe LAE; mild to   moderate TR with severely elevated pulmonary pressure.   ASSESSMENT AND PLAN: (Focused Visit)  1.  Severe mitral valve regurgitation: Significant for proggression of symptoms over the last week causing her to have severe dyspnea, weakness, near syncope, along with rapid heart rhythm. A long discussion was had with her and her son concerning for progression of her symptoms. She knows that she is not a candidate for any valvular surgery.  I have asked her to consider having a wheelchair which can be helpful to get her to the dining room as she misses eating dinner with her friends. Someone is available to take her from her home to the dining room and back. Her son was even considering getting her scooter. She worried that she will have to go to assisted living. She is not in favor of that, wanting to stay in her own home.  I have told her that she needs to reduce her exertional activities including making her bed or carrying heavy objects which could be something as small as her purse, which  can wear her out causing her to be short of breath.   I have gone over the anatomy of the heart and pointed out where the mitral valve was located as well as aortic valve. I have told her that this is likely progressed and now causing her significant symptoms. Discussion with family is recommended concerning end-of-life issues.  For now we will help her with her ambulatory status by ordering a wheelchair. I have found that a scooter is not covered by insurance but her son is willing to buy her one. I have told her if her symptoms, worsen to occur with  normal activities she will need to report this and will need to have a more in-depth conversation concerning end-of-life issues and need to progress to assisted living.  No changes in her medication regimen for now.   Current medicines are reviewed at length with the patient today.  I spent 40 minutes with this patient and her son today.  Labs/ tests ordered today include: None   Phill Myron. West Pugh, ANP, AACC   10/21/2017 4:30 PM    [facility] [address], [place], [place] [address] Phone: [phone]; Fax: [phone]

## 2017-10-21 NOTE — Telephone Encounter (Signed)
Saw patient today

## 2017-10-21 NOTE — Telephone Encounter (Signed)
New message   Son would also like you to text or email him the appt for tomorrow with Jory Sims 230p 10/22/17  Pt c/o of Chest Pain: STAT if CP now or developed within 24 hours  1. Are you having CP right now? No- this episode happened early Friday morning - she didn't tell her son until Friday night   2. Are you experiencing any other symptoms (ex. SOB, nausea, vomiting, sweating)?  Sob is an issue now , she did not tell him until after the chest pains subsided   3. How long have you been experiencing CP? Had event this weekend   4. Is your CP continuous or coming and going  A continuous episode of chest pain, he thinks she may have had a mild heart attack  5. Have you taken Nitroglycerin?  no ?

## 2017-10-21 NOTE — Patient Instructions (Signed)
Medication Instructions:  NO CHANGES-Your physician recommends that you continue on your current medications as directed. Please refer to the Current Medication list given to you today.  If you need a refill on your cardiac medications before your next appointment, please call your pharmacy.  Special Instructions: MAY WANT TO CALL [facility] TO DISCUSS MEDICATION [phone]  [address] [place], [place], [place] [address]  Follow-Up: Your physician wants you to follow-up in: [place].  Thank you for choosing [facility] at [facility]!!

## 2017-10-21 NOTE — Telephone Encounter (Signed)
Spoke with patients son and patient had chest pains that were quite intense that lasted several hours late Thursday and early Friday morning. Son did not find out until late Friday night. She is noticeably more shortness of breath since episode. Move appointment from tomorrow to today. Son appreciated moving and call back.

## 2017-10-21 NOTE — Telephone Encounter (Signed)
Rx(s) sent to pharmacy electronically.  

## 2017-10-22 ENCOUNTER — Ambulatory Visit: Payer: Medicare Other | Admitting: Adult Health

## 2017-10-23 ENCOUNTER — Telehealth: Payer: Self-pay | Admitting: Podiatry

## 2017-10-23 NOTE — Telephone Encounter (Signed)
Per voicemail from pt she has been dx with congestive heart failure and is going to be confined to a wheelchair and would like to try and cancel the custom shoe order if possible.  I called [employer] and they just got the order in per [facility] and have not started making the shoes yet. She was able to cancel the order and if pt decides to go ahead with custom shoes again we will have to recast her.  I notified pt that I was able to cancel the custom shoes and she said thank you she would not be able to put that money towards an electric wheelchair.

## 2017-10-24 ENCOUNTER — Telehealth: Payer: Self-pay | Admitting: Cardiovascular Disease

## 2017-10-24 ENCOUNTER — Other Ambulatory Visit: Payer: Self-pay | Admitting: Neurology

## 2017-10-24 DIAGNOSIS — H16223 Keratoconjunctivitis sicca, not specified as Sjogren's, bilateral: Secondary | ICD-10-CM | POA: Diagnosis not present

## 2017-10-24 DIAGNOSIS — H2513 Age-related nuclear cataract, bilateral: Secondary | ICD-10-CM | POA: Diagnosis not present

## 2017-10-24 DIAGNOSIS — H35412 Lattice degeneration of retina, left eye: Secondary | ICD-10-CM | POA: Diagnosis not present

## 2017-10-24 DIAGNOSIS — H40013 Open angle with borderline findings, low risk, bilateral: Secondary | ICD-10-CM | POA: Diagnosis not present

## 2017-10-24 NOTE — Telephone Encounter (Signed)
New Message    Patient called she does not need hydrALAZINE (APRESOLINE) 25 MG tablet called into [facility] , she found her bottle   Thank you

## 2017-10-24 NOTE — Telephone Encounter (Signed)
Spoke with pt, aware calcium is not for the heart, she reports it is for her bones. Advised her to talk to her medical doctor. Also she called to let us know she found her bottle of hydralazine and does not need a refill.

## 2017-10-24 NOTE — Telephone Encounter (Signed)
New Message:     Pt wants to know if she should continue taking her Calcium medicine?

## 2017-10-28 DIAGNOSIS — I4891 Unspecified atrial fibrillation: Secondary | ICD-10-CM | POA: Diagnosis not present

## 2017-10-28 DIAGNOSIS — Z7901 Long term (current) use of anticoagulants: Secondary | ICD-10-CM | POA: Diagnosis not present

## 2017-10-28 LAB — PROTIME-INR: INR: 2.5 — AB (ref 0.9–1.1)

## 2017-10-29 ENCOUNTER — Ambulatory Visit (INDEPENDENT_AMBULATORY_CARE_PROVIDER_SITE_OTHER): Payer: Medicare Other | Admitting: Pharmacist

## 2017-10-29 DIAGNOSIS — Z7901 Long term (current) use of anticoagulants: Secondary | ICD-10-CM

## 2017-10-29 DIAGNOSIS — I480 Paroxysmal atrial fibrillation: Secondary | ICD-10-CM

## 2017-10-30 ENCOUNTER — Other Ambulatory Visit: Payer: Self-pay | Admitting: Neurology

## 2017-10-30 ENCOUNTER — Telehealth: Payer: Self-pay | Admitting: Cardiovascular Disease

## 2017-10-30 NOTE — Telephone Encounter (Signed)
Returned call to patient of Dr. Claiborne Billings who saw Dr. Purcell Nails on 3/4 and was ordered to have a wheelchair. She bought a Scientist, research (life sciences) from a [employer] She states Louie Casa from [employer] comes to [facility] and can help her learn how to do this but needs orders signed by Dr. Claiborne Billings. Patient states that Louie Casa has faxed over order on Monday and today to be signed.   Will route to [place], RN for follow up

## 2017-10-30 NOTE — Telephone Encounter (Signed)
Rs.[place] is calling to see if she can get orders from Dr. Claiborne Billings to have therapy to learn how to drive her electric wheelchair . Please call

## 2017-10-31 NOTE — Telephone Encounter (Signed)
S/w [facility]) he states that he needs an order for occupational therapy so his mother can learn to use her new electric scooter. Informed son that he will need to call PCP for this. He said that [facility] LA[place] ([place]), DNP wrote the wrx and wants to know why she cannot do this. Informed pt that giving rx to purchase a w/c and OT are 2 different issues and this is for the Primary MD to do. He states that he will call primary care, he will call back with any other questions.

## 2017-11-07 ENCOUNTER — Ambulatory Visit: Payer: Medicare Other | Admitting: Cardiovascular Disease

## 2017-11-08 ENCOUNTER — Ambulatory Visit (INDEPENDENT_AMBULATORY_CARE_PROVIDER_SITE_OTHER): Payer: Medicare Other | Admitting: Cardiovascular Disease

## 2017-11-08 ENCOUNTER — Encounter: Payer: Self-pay | Admitting: Cardiovascular Disease

## 2017-11-08 VITALS — BP 114/58 | HR 59 | Ht 62.0 in | Wt 116.6 lb

## 2017-11-08 DIAGNOSIS — I350 Nonrheumatic aortic (valve) stenosis: Secondary | ICD-10-CM | POA: Diagnosis not present

## 2017-11-08 DIAGNOSIS — I340 Nonrheumatic mitral (valve) insufficiency: Secondary | ICD-10-CM

## 2017-11-08 DIAGNOSIS — I38 Endocarditis, valve unspecified: Secondary | ICD-10-CM

## 2017-11-08 DIAGNOSIS — I348 Other nonrheumatic mitral valve disorders: Secondary | ICD-10-CM | POA: Diagnosis not present

## 2017-11-08 DIAGNOSIS — I272 Pulmonary hypertension, unspecified: Secondary | ICD-10-CM

## 2017-11-08 DIAGNOSIS — Z7901 Long term (current) use of anticoagulants: Secondary | ICD-10-CM

## 2017-11-08 DIAGNOSIS — R29898 Other symptoms and signs involving the musculoskeletal system: Secondary | ICD-10-CM | POA: Diagnosis not present

## 2017-11-08 DIAGNOSIS — I5030 Unspecified diastolic (congestive) heart failure: Secondary | ICD-10-CM

## 2017-11-08 DIAGNOSIS — I480 Paroxysmal atrial fibrillation: Secondary | ICD-10-CM

## 2017-11-08 DIAGNOSIS — I10 Essential (primary) hypertension: Secondary | ICD-10-CM

## 2017-11-08 DIAGNOSIS — I4891 Unspecified atrial fibrillation: Secondary | ICD-10-CM | POA: Diagnosis not present

## 2017-11-08 MED ORDER — SPIRONOLACTONE 25 MG PO TABS: 12.5000 mg | ORAL_TABLET | Freq: Every day | ORAL | 6 refills | Status: DC

## 2017-11-08 NOTE — Patient Instructions (Signed)
MEDICATION INSTRUCTIONS  START SPIRONOLACTONE 12.5 MG ( TAKE 1/2 TABLET OF 25 MG ) DAILY     LABS IN 2 WEEKS BMP  BNP    TEST [address] Your physician has requested that you have an echocardiogram. Echocardiography is a painless test that uses sound waves to create images of your heart. It provides your doctor with information about the size and shape of your heart and how well your heart's chambers and valves are working. This procedure takes approximately one hour. There are no restrictions for this procedure.    Your physician recommends that you schedule a follow-up appointment in 6 -[place].   If you need a refill on your cardiac medications before your next appointment, please call your pharmacy.

## 2017-11-08 NOTE — Progress Notes (Signed)
Patient ID: [name], female   DOB: 1922-05-10, 84 y.o.   MRN: [id number]      HPI: [name] is a 84 y.o. female presents to the office today for a 6 month followup cardiology evaluation.  [name] has a history of hypertension, hyperlipidemia, as well as valvular heart disease. An echo Doppler study in December 2013 showed normal systolic function with an ejection fraction of 55-60%. She had mild stenosis of her aortic valve, moderately severe calcified anulus of the mitral valve with moderate MR, moderately severe LA dilatation,  mild-to-moderate RA dilatation, moderate tricuspid regurgitation and moderately severe pulmonary hypertension with PA pressure estimated at 66 mm. Last year, I started her on amlodipine at 2.5 mg.  She felt that she was breathing better since initiating this therapy and  her blood pressure had markedly improved. A followup echo Doppler study on 11/16/2013 showed normal systolic function with an ejection fraction at 60-65%.  Diastolic parameters were normal.  She had mitral annular calcification with moderate mitral regurgitation.  The left atrium was moderately dilated, the right atrium was moderately dilated, and her tricuspid stenosis was now considered severe.  Pulmonary pressures were slightly reduced from previously, but were still elevated at 61 mm.    She was admitted to the hospital on 09/13/2014 and discharged  3 days later.  On the day of admission she awakened from sleep with midsternal all chest discomfort which he initially felt was indigestion.  Her troponin was mildly elevated at 0.23, which increased to 1.15 and there were new inferior T-wave abnormalities.  She was felt to have suffered a non-ST segment elevation MI.  She also developed paroxysmal atrial fibrillation with RVR, which resulted in similar symptomatology as her admission and was felt most likely that this may have been the cause for her admission.  I performed cardiac  catheterization on 09/14/14  Fluoroscopy revealed severe mitral annular calcification and very mild calcification in the region of the aortic valve.  She had hyperdynamic LV function with an ejection fraction of a proximally 70%.  There was severe mitral annular calcification with 3+ MR.  There was mild calcification in the region of the RCA ostium, but otherwise normal-appearing coronary arteries.  Right heart catheterization was performed and her wedge pressure mean was 10.  LV pressure was 152/11.  Pulmonary artery pressure was 33/12.  Cardiac output was 3.3 L/m by the thermodilution method and 3.7 L/m by the Fick method.  When I saw her in April 2016 she was doing well and maintaining sinus rhythm with ventricular rate at 58 bpm. She was recently hospitalized on 05/08/2015 through 05/12/2015 with this and presyncope.  She was found to be bradycardic and had a junctional rhythm.  Her Cardizem, beta blocker therapy was discontinued.  She  Was dehydrated andalso had mild elevation of potassium with renal insufficiency and ACE inhibition was discontinued. She also had a urinary tract infection for which she was treated with Keflex. She was started back on iron therapy for iron deficiency anemia. At discharge, Cardizem low dose was restarted.  After going home from the hospital, on 05/17/2015 her heart rate increased to greater than 100 bpm. Her blood pressure now has been increased on her, reduced medications.  She is on Coumadin therapy  With a chads2vasc score of 4.  When I saw her, her blood pressure was elevated and with her episodes of increased heart rate.  I started her on carvedilol one half of a 3.125 mg  twice a day.  She has tolerated this well and denies any recurrent episodes of fast heartbeat or any episodes of dizziness.  She admits to trace edema above her pressure socks.  An echo Doppler study on 05/09/2015 showed an ejection fraction of 65-70%.  There was grade 1 diastolic dysfunction.  There was  mild aortic stenosis with trivial AR, severe mitral annular calcification with moderate MR, moderate left atrial dilatation, moderate TR and increased pulmonary pressures at 74 mm Hg. in December 2016, a carotid duplex study demonstrated heterogeneous plaque bilaterally.  There was 1-39% bilateral ICA stenoses.  She had normal subclavian arteries bilaterally.  She had patent vertebral arteries with antegrade flow  In October 2017 she had a fall and  tripped over a step.  She did not go to the emergency room, but saw her primary physician and a CT scan of her head revealed mild injury to her right occipital scalp but no underlying skull fracture or intracranial hemorrhage.  There was an air-fluid level of the maxillary sinus.   When I last saw her in April 2018.  She was not having any chest pain, although admitted to some mild shortness of breath.  She was walking with a cane.  specifically denies any chest pain.    She goes to a salt water pool twice per week.  She denies any episodes of dizziness.  She denies any further falls.  She is no longer driving.    I last saw her in October 2018 at which time she was getting more visibly weaker..  She denied any recurrent chest pain.  She really was experiencing some shortness of breath with walking.  She was unaware of recurrent arrhythmia.  She denies syncope.  A follow-up echo Doppler study in May 2018 showed an EF of 60-65% with grade 2 diastolic dysfunction.  There was mild aortic stenosis with trivial AR, severe left atrial dilation with severe mitral regurgitation and she had significant pulmonary hypertension with a PA peak pressure estimate at 64 mm.  her last office visit, I was concerned with potential fall risk.  Now that she has become more frail and ordered an event monitor to make certain she was not having recurrent PAF.  The monitor was done from 06/26/2017 through 07/25/2017.  She was maintaining sinus rhythm and had some mild bradycardia, sinus  arrhythmia with PACs, and some episodes of sinus tachycardia.  There were no episodes of recurrent AF.    Since I last saw her, she was evaluated by Loma Sousa, PAC on 08/10/2017 and more recently by Jory Sims, NP on 10/21/2017.  Given her advanced age and severe MR medical therapy has been continued without plans for surgical intervention.  She has since purchased a scooter to allow for improved mobility.  She still living in independent living at [facility] and has been avoiding switching to assisted living.  She is here with her son today for further evaluation.  Past Medical History:  Diagnosis Date  . Abnormality of gait 09/07/2015  . Anemia, iron deficiency 09/16/2014  . Anxiety   . CAD (coronary artery disease)    a. 08/2014 NSTEMI/Cath: mild Ca2+ or RCA ostium, otw nl cors. CO 3.3 L/min (thermo), 3.7 L/min (Fick).  . Carotid arterial disease ([place]) 07/30/2012   carotid doppler; normal study  . Chronic anticoagulation, with coumadin, with PAF and CHADS2Vasc2 score of 4 09/16/2014  . Degenerative arthritis   . Diverticulosis   . Essential hypertension   . Foot  fracture, left   . GERD (gastroesophageal reflux disease)   . History of nuclear stress test 09/19/2007   normal pattern of perfusion; post-stress EF 86%; EKG negative for ischemia; low risk scan  . Hyperlipidemia   . Left carotid bruit    a. 07/2015 Carotid U/S: 1-39% bilat ICA stenosis.  . Memory disorder 03/05/2014  . Mild renal insufficiency   . Mitral prolapse   . Neuropathy   . PAF (paroxysmal atrial fibrillation) (HCC)    a. 08/2014-->Coumadin (CHA2DS2VASc = 4).  . Peripheral neuropathy    Small fiber   . Pre-syncope    a. 04/2015 in setting of bradycardia-->CCB/BB doses adjusted.  . Pulmonary hypertension ([place])   . Valvular heart disease    a. 04/2015 Echo: EF 65-70%, no rwma, Gr1 DD, mild AS, triv AI, mild MS, mod MR, mod dil LA, mod TR, sev increased PASP.    Past Surgical History:  Procedure Laterality  Date  .  Bilateral bunionectomies    . ABDOMINAL HYSTERECTOMY    . APPENDECTOMY    . DILATION AND CURETTAGE OF UTERUS    . FOOT FRACTURE SURGERY Left   . gallbladder resection    . LEFT HEART CATHETERIZATION WITH CORONARY ANGIOGRAM N/A 09/14/2014   Procedure: LEFT HEART CATHETERIZATION WITH CORONARY ANGIOGRAM;  Surgeon: Troy Sine, MD;  Location: [facility] CATH LAB;  Service: Cardiovascular;  Laterality: N/A;  . REPLACEMENT TOTAL KNEE Right   . resuspension procedure    . ROBOTIC ASSISTED BILATERAL SALPINGO OOPHERECTOMY Bilateral 02/19/2017   Procedure: XI ROBOTIC ASSISTED BILATERAL SALPINGO OOPHORECTOMY AND LYSIS OF ADHESION;  Surgeon: Everitt Amber, MD;  Location: [facility] ORS;  Service: Gynecology;  Laterality: Bilateral;  . TEAR DUCT PROBING WITH STRABISMUS REPAIR     Tear duct repair surgery  . TONSILLECTOMY    . UMBILICAL HERNIA REPAIR      Allergies  Allergen Reactions  . Avelox [Moxifloxacin Hcl In Nacl] Shortness Of Breath and Swelling  . Moxifloxacin Other (See Comments), Shortness Of Breath and Swelling    other  . Aricept [Donepezil Hcl] Diarrhea  . Codeine Swelling       . Donepezil Diarrhea  . Garlic Other (See Comments)    Gi problems  . Latex Hives, Itching, Rash and Other (See Comments)    Watery blisters   . Onion Other (See Comments)    Gi problems  . Sulfa Drugs Cross Reactors Swelling  . Other Other (See Comments)    Her throat closes up  . Duloxetine Rash  . Polysporin [Bacitracin-Polymyxin B] Other (See Comments)    other    Current Outpatient Medications  Medication Sig Dispense Refill  . acetaminophen (TYLENOL) 500 MG tablet Take 500 mg by mouth every 6 (six) hours as needed for moderate pain.    Marland Kitchen atorvastatin (LIPITOR) 20 MG tablet Take 0.5 tablets (10 mg total) by mouth at bedtime. 90 tablet 2  . Calcium Carb-Cholecalciferol (CALCIUM 600+D) 600-800 MG-UNIT TABS Take 1 tablet by mouth daily.    . carvedilol (COREG) 3.125 MG tablet TAKE 1 TABLET (3.125 MG  TOTAL) BY MOUTH 2 (TWO) TIMES DAILY. 180 tablet 2  . cetirizine (ZYRTEC) 10 MG tablet Take 10 mg by mouth daily with breakfast.     . cholecalciferol (VITAMIN D) 1000 UNITS tablet Take 1,000 Units by mouth daily.      . colestipol (COLESTID) 1 G tablet Take 1 g by mouth 3 (three) times daily. For diarrhea and does not take at the same time  as coumadin    . cycloSPORINE (RESTASIS) 0.05 % ophthalmic emulsion Place 1 drop into both eyes 2 (two) times daily.     Marland Kitchen diltiazem (CARDIZEM CD) 120 MG 24 hr capsule Take 1 capsule (120 mg total) by mouth daily. 90 capsule 3  . Emollient (AVEENO ADVANCED CARE EX) Apply 1 application topically 2 (two) times daily.    . Flaxseed, Linseed, (FLAX SEED OIL) 1000 MG CAPS Take 1 capsule by mouth 3 (three) times daily.    . fluticasone (FLONASE) 50 MCG/ACT nasal spray Place 2 sprays into both nostrils 2 (two) times daily.    Marland Kitchen gabapentin (NEURONTIN) 300 MG capsule TAKE 1 CAPSULE THREE TIMES DAILY AND 3 CAPSULES AT BEDTIME (Patient taking differently: Take 300 mg in the morning and at lunch, then take 900 mg at bedtime) 540 capsule 1  . hydrALAZINE (APRESOLINE) 25 MG tablet Take 1 tablet (25 mg total) by mouth 3 (three) times daily. 270 tablet 3  . hydrochlorothiazide (HYDRODIURIL) 25 MG tablet Take 1 tablet (25 mg total) daily by mouth. 90 tablet 3  . LACTOBACILLUS RHAMNOSUS, GG, PO Take 1 capsule by mouth daily.    Marland Kitchen levETIRAcetam (KEPPRA) 250 MG tablet One tablet in the morning and 2 in the evening 90 tablet 5  . loperamide (IMODIUM A-D) 2 MG tablet Take 2 mg by mouth daily with breakfast.    . memantine (NAMENDA) 10 MG tablet TAKE 1 TABLET TWICE DAILY 180 tablet 1  . mirtazapine (REMERON) 7.5 MG tablet     . Multiple Vitamin (MULTIVITAMIN) tablet Take 1 tablet by mouth daily.      . pantoprazole (PROTONIX) 40 MG tablet Take 40 mg by mouth daily.     Marland Kitchen PEPPERMINT OIL PO Take 1 Dose by mouth 2 (two) times daily. 1 dose in the morning and at bedtime    . pyridOXINE  (VITAMIN B-6) 100 MG tablet Take 100 mg by mouth daily.    . ranitidine (ZANTAC) 150 MG tablet Take 150 mg by mouth 2 (two) times daily.     . simethicone (MYLICON) 250 MG chewable tablet Chew 125 mg by mouth as needed for flatulence.    . vitamin B-12 (CYANOCOBALAMIN) 1000 MCG tablet Take 1,000 mcg by mouth daily.    Marland Kitchen warfarin (COUMADIN) 2 MG tablet TAKE 1 AND 1/2 TO 2 TABLETS DAILY AS DIRECTED BY COUMADIN CLINIC 180 tablet 1  . spironolactone (ALDACTONE) 25 MG tablet Take 0.5 tablets (12.5 mg total) by mouth daily. 15 tablet 6   No current facility-administered medications for this visit.     Socially she is widowed. Has 2 children and 2 grandchildren. She does walk; no tobacco or alcohol use. She resides at [facility]: independent living.  She remains active.  ROS General: Negative; No fevers, chills, or night sweats; more weakness HEENT: Negative; No changes in vision or hearing, sinus congestion, difficulty swallowing Pulmonary: Negative; No cough, wheezing, hemoptysis Cardiovascular: See history of present illness  GI: Negative; No nausea, vomiting, diarrhea, or abdominal pain GU:  Recent UTI Musculoskeletal: Negative; no myalgias, joint pain, or weakness; she is now walking with a cane Hematologic/Oncology: Negative; no easy bruising, bleeding Endocrine: Negative; no heat/cold intolerance; no diabetes Neuro: Negative; no changes in balance, headaches Skin: Negative; No rashes or skin lesions Psychiatric: Negative; No behavioral problems, depression Sleep: Negative; No snoring, daytime sleepiness, hypersomnolence, bruxism, restless legs, hypnogognic hallucinations, no cataplexy Other comprehensive 14 point system review is negative.   PE BP (!) 114/58  Pulse (!) 59   Ht _0  (1.575 m)   Wt 116 lb 9.6 oz (52.9 kg)   BMI 21.33 kg/m    Repeat blood pressure by me was 118/64  Wt Readings from Last 3 Encounters:  11/08/17 116 lb 9.6 oz (52.9 kg)  10/21/17 115 lb (52.2  kg)  10/09/17 114 lb (51.7 kg)    General: Alert, oriented, no distress.  Frail in appearance Skin: normal turgor, no rashes, warm and dry HEENT: Normocephalic, atraumatic. Pupils equal round and reactive to light; sclera anicteric; extraocular muscles intact;  Nose without nasal septal hypertrophy Mouth/Parynx benign; Mallinpatti scale 2 Neck: No JVD, no carotid bruits; normal carotid upstroke Lungs: clear to ausculatation and percussion; no wheezing or rales Chest wall: without tenderness to palpitation Heart: PMI not displaced, RRR, s1 s2 normal, 1-6/8 aortic systolic murmur, 3/6 apical systolic murmur; no diastolic murmur, no rubs, gallops, thrills, or heaves Abdomen: soft, nontender; no hepatosplenomehaly, BS+; abdominal aorta nontender and not dilated by palpation. Back: no CVA tenderness Pulses 2+ Musculoskeletal: full range of motion, normal strength, no joint deformities Extremities: Trace ankle edema ;no clubbing cyanosis,  Homan's sign negative  Neurologic: grossly nonfocal; Cranial nerves grossly wnl Psychologic: Normal mood and affect   ECG (independently read by me): sinus bradycardia at 59 bpm.  PAC.  Mild criteria for LVH.  QTc interval 445 ms.  October 2018 ECG (independently read by me): Normal sinus rhythm at 60 bpm.  Left axis deviation.  Borderline voltage criteria for LVH in aVL.  April 2018 ECG (independently read by me): Sinus bradycardia 58 bpm.  PH by voltage.  No significant ST-T changes.  October 2017 ECG (independently read by me): Normal sinus rhythm at 66 bpm.  No ectopy.  Normal intervals.  Borderline LVH by voltage in aVL.  December 2016 ECG (independently read by me): Normal sinus rhythm at 78 bpm.  Normal intervals.  ECG (independently read by me):  Sinus rhythm with sinus arrhythmia at 80 bpm.  Normal intervals.  No ST segment changes.  April 2016 ECG (independently read by me): Sinus bradycardia 58 bpm.  Left axis deviation.  LVH by voltage  criteria in aVL.  No significant ST segment changes.  October 2015 ECG (independently read by me): Sinus bradycardia 48 beats per minute.  No ectopy.  PR interval 164 ms  Prior April 2015 ECG (independently by me) sinus bradycardia 50 beats per minute.  No ectopy.  Normal intervals.  No ST segment changes.  Prior 09/02/2013 ECG (independently read by me ): Sinus bradycardia at 51 beats per minute. Left axis deviation. No significant ST change. PR interval 160 ms. QTc interval 418 ms.   LABS:  BMP Latest Ref Rng & Units 02/13/2017 12/11/2016 11/20/2015  Glucose 65 - 99 mg/dL 90 87 89  BUN 6 - 20 mg/dL 25(H) 28(H) 12  Creatinine 0.44 - 1.00 mg/dL 0.83 0.79 0.58  Sodium 135 - 145 mmol/L 139 140 141  Potassium 3.5 - 5.1 mmol/L 4.3 4.0 3.6  Chloride 101 - 111 mmol/L 102 103 106  CO2 22 - 32 mmol/L _1 Calcium 8.9 - 10.3 mg/dL 9.8 10.0 9.7    Hepatic Function Latest Ref Rng & Units 02/13/2017 12/11/2016 11/20/2015  Total Protein 6.5 - 8.1 g/dL 8.0 7.4 7.8  Albumin 3.5 - 5.0 g/dL 4.0 4.2 4.2  AST 15 - 41 U/L 26 23 32  ALT 14 - 54 U/L _2 Alk Phosphatase 38 - 126 U/L  45 43 40  Total Bilirubin 0.3 - 1.2 mg/dL 0.3 0.3 0.2(L)    CBC Latest Ref Rng & Units 02/13/2017 12/10/2016 11/20/2015  WBC 4.0 - 10.5 K/uL 7.3 5.4 4.1  Hemoglobin 12.0 - 15.0 g/dL 11.2(L) 11.5(L) 11.9(L)  Hematocrit 36.0 - 46.0 % 33.7(L) 35.2 34.7(L)  Platelets 150 - 400 K/uL 350 278 198   Lab Results  Component Value Date   MCV 83.6 02/13/2017   MCV 87.6 12/10/2016   MCV 83.4 11/20/2015   Lab Results  Component Value Date   TSH 1.95 12/10/2016   Lab Results  Component Value Date   HGBA1C 5.6 09/13/2014    BNP No results found for: PROBNP   Lipid Panel     Component Value Date/Time   CHOL 111 12/11/2016 0707   TRIG 71 12/11/2016 0707   HDL 66 12/11/2016 0707   CHOLHDL 1.7 12/11/2016 0707   VLDL 14 12/11/2016 0707   LDLCALC 31 12/11/2016 0707     RADIOLOGY: No results  found.  IMPRESSION:  1. Valvular heart disease   2. Moderate to severe pulmonary hypertension ([place])   3. Mild aortic stenosis   4. Essential hypertension   5. (HFpEF) heart failure with preserved ejection fraction ([place])   6. Severe mitral regurgitation   7. PAF (paroxysmal atrial fibrillation) ([place])   8. Chronic anticoagulation     ASSESSMENT AND PLAN: [name] is a very pleasant alert 84 years old Caucasian female who has a history of hypertension, hyperlipidemia, significant mitral annular calcification with MR.  During her hospitalization in 2016 she was noted to have paroxysmal atrial fibrillation with rapid ventricular response which may have been the inciting cause of her chest pain which awakened her from sleep.   An echo Doppler study in September 2016 showed an ejection fraction at 65-70%.  There was mild LVH.  Grade 1 diastolic dysfunction.  There was evidence for mild aortic valve stenosis with trivial AR, severely calcified mitral annulus with mild stenosis and moderate regurgitation.  She had moderate LA dilatation and moderate TR with significant pulmonary pressure elevation at 74 mm Hg.  Cardiac catheterization did not reveal significant coronary obstructive disease.  Following diuresis she did not have significant pulmonary hypertension and her PA pressure on right heart catheterization was only 33 mm systolically.   Her last echo Doppler study of May 2018 continued to show normal systolic function with grade 2 diastolic dysfunction and suggested progression of her mitral valve disease to severe mitral regurgitation in the setting of a severely dilated left atrium, mild aortic stenosis with trivial AR, and a PA pressure estimated at 64 mm. I previously discussed for LT and potential fall risk at her last visit.  She now moves around with an electric wheelchair.  She does admit to sodium intake.  She does experience shortness of breath.  An event monitor did not reveal any recurrent  AF.  I am recommending a follow-up echo Doppler study be done to reassess her systolic function as well as valvular abnormalities, and pulmonary hypertension.  I discussed sodium restriction.  I am adding low-dose spironolactone at 12.5 mg daily.  I'm checking a be met in BNP.  I will see her back in the office in 6 weeks for reevaluation.  Time spent: 2 5 minutes  Troy Sine, MD, [facility]  11/09/2017 3:37 PM

## 2017-11-09 ENCOUNTER — Encounter: Payer: Self-pay | Admitting: Cardiovascular Disease

## 2017-11-11 DIAGNOSIS — I4891 Unspecified atrial fibrillation: Secondary | ICD-10-CM | POA: Diagnosis not present

## 2017-11-11 DIAGNOSIS — R29898 Other symptoms and signs involving the musculoskeletal system: Secondary | ICD-10-CM | POA: Diagnosis not present

## 2017-11-11 DIAGNOSIS — I348 Other nonrheumatic mitral valve disorders: Secondary | ICD-10-CM | POA: Diagnosis not present

## 2017-11-12 DIAGNOSIS — I348 Other nonrheumatic mitral valve disorders: Secondary | ICD-10-CM | POA: Diagnosis not present

## 2017-11-12 DIAGNOSIS — I4891 Unspecified atrial fibrillation: Secondary | ICD-10-CM | POA: Diagnosis not present

## 2017-11-12 DIAGNOSIS — R29898 Other symptoms and signs involving the musculoskeletal system: Secondary | ICD-10-CM | POA: Diagnosis not present

## 2017-11-13 ENCOUNTER — Other Ambulatory Visit: Payer: Self-pay

## 2017-11-13 ENCOUNTER — Ambulatory Visit (HOSPITAL_COMMUNITY): Payer: Medicare Other | Attending: Cardiology

## 2017-11-13 DIAGNOSIS — I272 Pulmonary hypertension, unspecified: Secondary | ICD-10-CM | POA: Insufficient documentation

## 2017-11-13 DIAGNOSIS — Z7901 Long term (current) use of anticoagulants: Secondary | ICD-10-CM | POA: Diagnosis not present

## 2017-11-13 DIAGNOSIS — I081 Rheumatic disorders of both mitral and tricuspid valves: Secondary | ICD-10-CM | POA: Diagnosis not present

## 2017-11-13 DIAGNOSIS — I350 Nonrheumatic aortic (valve) stenosis: Secondary | ICD-10-CM

## 2017-11-13 DIAGNOSIS — I38 Endocarditis, valve unspecified: Secondary | ICD-10-CM | POA: Diagnosis not present

## 2017-11-13 DIAGNOSIS — I2510 Atherosclerotic heart disease of native coronary artery without angina pectoris: Secondary | ICD-10-CM | POA: Insufficient documentation

## 2017-11-13 DIAGNOSIS — I5030 Unspecified diastolic (congestive) heart failure: Secondary | ICD-10-CM | POA: Diagnosis not present

## 2017-11-13 DIAGNOSIS — I4891 Unspecified atrial fibrillation: Secondary | ICD-10-CM | POA: Diagnosis not present

## 2017-11-13 DIAGNOSIS — E785 Hyperlipidemia, unspecified: Secondary | ICD-10-CM | POA: Diagnosis not present

## 2017-11-13 DIAGNOSIS — I10 Essential (primary) hypertension: Secondary | ICD-10-CM | POA: Diagnosis not present

## 2017-11-14 DIAGNOSIS — I4891 Unspecified atrial fibrillation: Secondary | ICD-10-CM | POA: Diagnosis not present

## 2017-11-14 DIAGNOSIS — I348 Other nonrheumatic mitral valve disorders: Secondary | ICD-10-CM | POA: Diagnosis not present

## 2017-11-14 DIAGNOSIS — R29898 Other symptoms and signs involving the musculoskeletal system: Secondary | ICD-10-CM | POA: Diagnosis not present

## 2017-11-18 DIAGNOSIS — I4891 Unspecified atrial fibrillation: Secondary | ICD-10-CM | POA: Diagnosis not present

## 2017-11-18 DIAGNOSIS — I480 Paroxysmal atrial fibrillation: Secondary | ICD-10-CM | POA: Diagnosis not present

## 2017-11-18 DIAGNOSIS — I348 Other nonrheumatic mitral valve disorders: Secondary | ICD-10-CM | POA: Diagnosis not present

## 2017-11-18 DIAGNOSIS — R29898 Other symptoms and signs involving the musculoskeletal system: Secondary | ICD-10-CM | POA: Diagnosis not present

## 2017-11-20 ENCOUNTER — Telehealth: Payer: Self-pay | Admitting: Cardiovascular Disease

## 2017-11-20 DIAGNOSIS — I4891 Unspecified atrial fibrillation: Secondary | ICD-10-CM | POA: Diagnosis not present

## 2017-11-20 DIAGNOSIS — I480 Paroxysmal atrial fibrillation: Secondary | ICD-10-CM | POA: Diagnosis not present

## 2017-11-20 DIAGNOSIS — I348 Other nonrheumatic mitral valve disorders: Secondary | ICD-10-CM | POA: Diagnosis not present

## 2017-11-20 DIAGNOSIS — R634 Abnormal weight loss: Secondary | ICD-10-CM | POA: Diagnosis not present

## 2017-11-20 DIAGNOSIS — R29898 Other symptoms and signs involving the musculoskeletal system: Secondary | ICD-10-CM | POA: Diagnosis not present

## 2017-11-20 DIAGNOSIS — I340 Nonrheumatic mitral (valve) insufficiency: Secondary | ICD-10-CM | POA: Diagnosis not present

## 2017-11-20 NOTE — Telephone Encounter (Signed)
Echo results given to pt son Rush [facility], okay per DPR.

## 2017-11-20 NOTE — Telephone Encounter (Signed)
Follow Up:   Son would like pt's Echo results from 11-13-17.H is going out of town tonight and would like the results before he leaves. He is very anxious about the findings of the Echo results.Marland Kitchen

## 2017-11-21 ENCOUNTER — Telehealth: Payer: Self-pay | Admitting: Cardiovascular Disease

## 2017-11-21 ENCOUNTER — Other Ambulatory Visit: Payer: Self-pay | Admitting: Cardiovascular Disease

## 2017-11-21 ENCOUNTER — Other Ambulatory Visit: Payer: Self-pay | Admitting: Neurology

## 2017-11-21 DIAGNOSIS — I480 Paroxysmal atrial fibrillation: Secondary | ICD-10-CM | POA: Diagnosis not present

## 2017-11-21 DIAGNOSIS — I348 Other nonrheumatic mitral valve disorders: Secondary | ICD-10-CM | POA: Diagnosis not present

## 2017-11-21 DIAGNOSIS — I10 Essential (primary) hypertension: Secondary | ICD-10-CM | POA: Diagnosis not present

## 2017-11-21 DIAGNOSIS — I5030 Unspecified diastolic (congestive) heart failure: Secondary | ICD-10-CM | POA: Diagnosis not present

## 2017-11-21 DIAGNOSIS — I272 Pulmonary hypertension, unspecified: Secondary | ICD-10-CM | POA: Diagnosis not present

## 2017-11-21 DIAGNOSIS — I350 Nonrheumatic aortic (valve) stenosis: Secondary | ICD-10-CM | POA: Diagnosis not present

## 2017-11-21 DIAGNOSIS — I4891 Unspecified atrial fibrillation: Secondary | ICD-10-CM | POA: Diagnosis not present

## 2017-11-21 DIAGNOSIS — I38 Endocarditis, valve unspecified: Secondary | ICD-10-CM | POA: Diagnosis not present

## 2017-11-21 DIAGNOSIS — R29898 Other symptoms and signs involving the musculoskeletal system: Secondary | ICD-10-CM | POA: Diagnosis not present

## 2017-11-21 NOTE — Telephone Encounter (Signed)
New Message:     Please call, question about her Diltiazem. 

## 2017-11-21 NOTE — Telephone Encounter (Signed)
Returned call to patient. She states [facility] was out of diltiazem 120mg  24 hr capsule so her son got the script from [facility] for 60mg . Explained that she needs to make sure she is taking a total of 120mg  daily of the extended release or 24 hour capsule. She voiced understanding.

## 2017-11-25 DIAGNOSIS — Z7901 Long term (current) use of anticoagulants: Secondary | ICD-10-CM | POA: Diagnosis not present

## 2017-11-25 DIAGNOSIS — I4891 Unspecified atrial fibrillation: Secondary | ICD-10-CM | POA: Diagnosis not present

## 2017-11-25 LAB — POCT INR: INR: 1.7

## 2017-11-26 ENCOUNTER — Ambulatory Visit (INDEPENDENT_AMBULATORY_CARE_PROVIDER_SITE_OTHER): Payer: Medicare Other | Admitting: Pharmacist Clinician (PhC)/ Clinical Pharmacy Specialist

## 2017-11-26 DIAGNOSIS — Z7901 Long term (current) use of anticoagulants: Secondary | ICD-10-CM | POA: Diagnosis not present

## 2017-11-26 DIAGNOSIS — I480 Paroxysmal atrial fibrillation: Secondary | ICD-10-CM

## 2017-11-30 ENCOUNTER — Other Ambulatory Visit: Payer: Self-pay | Admitting: Neurology

## 2017-12-06 ENCOUNTER — Emergency Department (HOSPITAL_COMMUNITY): Payer: Medicare Other

## 2017-12-06 ENCOUNTER — Other Ambulatory Visit: Payer: Self-pay

## 2017-12-06 ENCOUNTER — Inpatient Hospital Stay (HOSPITAL_COMMUNITY)
Admission: EM | Admit: 2017-12-06 | Discharge: 2017-12-17 | DRG: 377 | Disposition: A | Discharge status: Skilled Nursing Facility | Payer: Medicare Other | Attending: Family Medicine | Admitting: Family Medicine

## 2017-12-06 ENCOUNTER — Encounter (HOSPITAL_COMMUNITY): Payer: Self-pay | Admitting: Emergency Medicine

## 2017-12-06 DIAGNOSIS — I21A1 Myocardial infarction type 2: Secondary | ICD-10-CM | POA: Diagnosis present

## 2017-12-06 DIAGNOSIS — I252 Old myocardial infarction: Secondary | ICD-10-CM

## 2017-12-06 DIAGNOSIS — Z87891 Personal history of nicotine dependence: Secondary | ICD-10-CM | POA: Diagnosis not present

## 2017-12-06 DIAGNOSIS — S0990XA Unspecified injury of head, initial encounter: Secondary | ICD-10-CM | POA: Diagnosis not present

## 2017-12-06 DIAGNOSIS — E876 Hypokalemia: Secondary | ICD-10-CM | POA: Diagnosis not present

## 2017-12-06 DIAGNOSIS — Z85828 Personal history of other malignant neoplasm of skin: Secondary | ICD-10-CM | POA: Diagnosis not present

## 2017-12-06 DIAGNOSIS — I350 Nonrheumatic aortic (valve) stenosis: Secondary | ICD-10-CM | POA: Diagnosis not present

## 2017-12-06 DIAGNOSIS — Z96651 Presence of right artificial knee joint: Secondary | ICD-10-CM | POA: Diagnosis present

## 2017-12-06 DIAGNOSIS — R2689 Other abnormalities of gait and mobility: Secondary | ICD-10-CM | POA: Diagnosis not present

## 2017-12-06 DIAGNOSIS — G629 Polyneuropathy, unspecified: Secondary | ICD-10-CM | POA: Diagnosis present

## 2017-12-06 DIAGNOSIS — K222 Esophageal obstruction: Secondary | ICD-10-CM | POA: Diagnosis present

## 2017-12-06 DIAGNOSIS — I4891 Unspecified atrial fibrillation: Secondary | ICD-10-CM | POA: Diagnosis not present

## 2017-12-06 DIAGNOSIS — I10 Essential (primary) hypertension: Secondary | ICD-10-CM | POA: Diagnosis present

## 2017-12-06 DIAGNOSIS — K922 Gastrointestinal hemorrhage, unspecified: Secondary | ICD-10-CM | POA: Diagnosis not present

## 2017-12-06 DIAGNOSIS — R0602 Shortness of breath: Secondary | ICD-10-CM | POA: Diagnosis not present

## 2017-12-06 DIAGNOSIS — E785 Hyperlipidemia, unspecified: Secondary | ICD-10-CM | POA: Diagnosis present

## 2017-12-06 DIAGNOSIS — Z66 Do not resuscitate: Secondary | ICD-10-CM | POA: Diagnosis present

## 2017-12-06 DIAGNOSIS — C50819 Malignant neoplasm of overlapping sites of unspecified female breast: Secondary | ICD-10-CM | POA: Diagnosis not present

## 2017-12-06 DIAGNOSIS — R791 Abnormal coagulation profile: Secondary | ICD-10-CM | POA: Diagnosis present

## 2017-12-06 DIAGNOSIS — R0902 Hypoxemia: Secondary | ICD-10-CM | POA: Diagnosis not present

## 2017-12-06 DIAGNOSIS — D500 Iron deficiency anemia secondary to blood loss (chronic): Secondary | ICD-10-CM | POA: Diagnosis not present

## 2017-12-06 DIAGNOSIS — R079 Chest pain, unspecified: Secondary | ICD-10-CM | POA: Diagnosis not present

## 2017-12-06 DIAGNOSIS — Z8261 Family history of arthritis: Secondary | ICD-10-CM

## 2017-12-06 DIAGNOSIS — I080 Rheumatic disorders of both mitral and aortic valves: Secondary | ICD-10-CM | POA: Diagnosis not present

## 2017-12-06 DIAGNOSIS — K529 Noninfective gastroenteritis and colitis, unspecified: Secondary | ICD-10-CM | POA: Diagnosis present

## 2017-12-06 DIAGNOSIS — J9600 Acute respiratory failure, unspecified whether with hypoxia or hypercapnia: Secondary | ICD-10-CM | POA: Diagnosis not present

## 2017-12-06 DIAGNOSIS — I480 Paroxysmal atrial fibrillation: Secondary | ICD-10-CM | POA: Diagnosis present

## 2017-12-06 DIAGNOSIS — Z9181 History of falling: Secondary | ICD-10-CM

## 2017-12-06 DIAGNOSIS — D649 Anemia, unspecified: Secondary | ICD-10-CM | POA: Diagnosis not present

## 2017-12-06 DIAGNOSIS — K921 Melena: Principal | ICD-10-CM | POA: Diagnosis present

## 2017-12-06 DIAGNOSIS — K449 Diaphragmatic hernia without obstruction or gangrene: Secondary | ICD-10-CM | POA: Diagnosis not present

## 2017-12-06 DIAGNOSIS — I2510 Atherosclerotic heart disease of native coronary artery without angina pectoris: Secondary | ICD-10-CM | POA: Diagnosis not present

## 2017-12-06 DIAGNOSIS — D62 Acute posthemorrhagic anemia: Secondary | ICD-10-CM

## 2017-12-06 DIAGNOSIS — R41841 Cognitive communication deficit: Secondary | ICD-10-CM | POA: Diagnosis not present

## 2017-12-06 DIAGNOSIS — I5032 Chronic diastolic (congestive) heart failure: Secondary | ICD-10-CM | POA: Diagnosis not present

## 2017-12-06 DIAGNOSIS — Z8249 Family history of ischemic heart disease and other diseases of the circulatory system: Secondary | ICD-10-CM

## 2017-12-06 DIAGNOSIS — R748 Abnormal levels of other serum enzymes: Secondary | ICD-10-CM | POA: Diagnosis not present

## 2017-12-06 DIAGNOSIS — D509 Iron deficiency anemia, unspecified: Secondary | ICD-10-CM | POA: Diagnosis not present

## 2017-12-06 DIAGNOSIS — R54 Age-related physical debility: Secondary | ICD-10-CM | POA: Diagnosis present

## 2017-12-06 DIAGNOSIS — I38 Endocarditis, valve unspecified: Secondary | ICD-10-CM | POA: Diagnosis present

## 2017-12-06 DIAGNOSIS — R0609 Other forms of dyspnea: Secondary | ICD-10-CM | POA: Diagnosis present

## 2017-12-06 DIAGNOSIS — Z7901 Long term (current) use of anticoagulants: Secondary | ICD-10-CM | POA: Diagnosis not present

## 2017-12-06 DIAGNOSIS — M6281 Muscle weakness (generalized): Secondary | ICD-10-CM | POA: Diagnosis not present

## 2017-12-06 DIAGNOSIS — J810 Acute pulmonary edema: Secondary | ICD-10-CM | POA: Diagnosis not present

## 2017-12-06 DIAGNOSIS — R2681 Unsteadiness on feet: Secondary | ICD-10-CM | POA: Diagnosis not present

## 2017-12-06 DIAGNOSIS — R0600 Dyspnea, unspecified: Secondary | ICD-10-CM | POA: Diagnosis not present

## 2017-12-06 DIAGNOSIS — J9811 Atelectasis: Secondary | ICD-10-CM | POA: Diagnosis not present

## 2017-12-06 DIAGNOSIS — E782 Mixed hyperlipidemia: Secondary | ICD-10-CM | POA: Diagnosis not present

## 2017-12-06 DIAGNOSIS — G8929 Other chronic pain: Secondary | ICD-10-CM | POA: Diagnosis present

## 2017-12-06 DIAGNOSIS — R4789 Other speech disturbances: Secondary | ICD-10-CM | POA: Diagnosis not present

## 2017-12-06 DIAGNOSIS — G9009 Other idiopathic peripheral autonomic neuropathy: Secondary | ICD-10-CM | POA: Diagnosis not present

## 2017-12-06 DIAGNOSIS — I959 Hypotension, unspecified: Secondary | ICD-10-CM | POA: Diagnosis not present

## 2017-12-06 DIAGNOSIS — M549 Dorsalgia, unspecified: Secondary | ICD-10-CM | POA: Diagnosis present

## 2017-12-06 DIAGNOSIS — I083 Combined rheumatic disorders of mitral, aortic and tricuspid valves: Secondary | ICD-10-CM | POA: Diagnosis present

## 2017-12-06 DIAGNOSIS — S199XXA Unspecified injury of neck, initial encounter: Secondary | ICD-10-CM | POA: Diagnosis not present

## 2017-12-06 DIAGNOSIS — R069 Unspecified abnormalities of breathing: Secondary | ICD-10-CM | POA: Diagnosis not present

## 2017-12-06 DIAGNOSIS — I214 Non-ST elevation (NSTEMI) myocardial infarction: Secondary | ICD-10-CM | POA: Diagnosis not present

## 2017-12-06 DIAGNOSIS — R778 Other specified abnormalities of plasma proteins: Secondary | ICD-10-CM

## 2017-12-06 DIAGNOSIS — G309 Alzheimer's disease, unspecified: Secondary | ICD-10-CM | POA: Diagnosis not present

## 2017-12-06 DIAGNOSIS — I272 Pulmonary hypertension, unspecified: Secondary | ICD-10-CM | POA: Diagnosis present

## 2017-12-06 DIAGNOSIS — Z9289 Personal history of other medical treatment: Secondary | ICD-10-CM

## 2017-12-06 DIAGNOSIS — M1990 Unspecified osteoarthritis, unspecified site: Secondary | ICD-10-CM | POA: Diagnosis not present

## 2017-12-06 DIAGNOSIS — I341 Nonrheumatic mitral (valve) prolapse: Secondary | ICD-10-CM | POA: Diagnosis not present

## 2017-12-06 DIAGNOSIS — K219 Gastro-esophageal reflux disease without esophagitis: Secondary | ICD-10-CM | POA: Diagnosis present

## 2017-12-06 DIAGNOSIS — E2740 Unspecified adrenocortical insufficiency: Secondary | ICD-10-CM | POA: Diagnosis not present

## 2017-12-06 DIAGNOSIS — R7989 Other specified abnormal findings of blood chemistry: Secondary | ICD-10-CM

## 2017-12-06 HISTORY — DX: Other seasonal allergic rhinitis: J30.2

## 2017-12-06 HISTORY — DX: Personal history of other medical treatment: Z92.89

## 2017-12-06 HISTORY — DX: Dorsalgia, unspecified: M54.9

## 2017-12-06 HISTORY — DX: Other chronic pain: G89.29

## 2017-12-06 HISTORY — DX: Other complications of anesthesia, initial encounter: T88.59XA

## 2017-12-06 HISTORY — DX: Unspecified malignant neoplasm of skin, unspecified: C44.90

## 2017-12-06 HISTORY — DX: Heart failure, unspecified: I50.9

## 2017-12-06 HISTORY — DX: Personal history of other diseases of the digestive system: Z87.19

## 2017-12-06 HISTORY — DX: Unspecified osteoarthritis, unspecified site: M19.90

## 2017-12-06 HISTORY — DX: Adverse effect of unspecified anesthetic, initial encounter: T41.45XA

## 2017-12-06 LAB — TROPONIN I: Troponin I: 0.24 ng/mL (ref <0.03)

## 2017-12-06 LAB — COMPREHENSIVE METABOLIC PANEL
ALT: 15 U/L (ref 14–54)
AST: 32 U/L (ref 15–41)
Albumin: 3.2 g/dL — ABNORMAL LOW (ref 3.5–5.0)
Alkaline Phosphatase: 40 U/L (ref 38–126)
Anion gap: 10 (ref 5–15)
BUN: 52 mg/dL — ABNORMAL HIGH (ref 6–20)
CO2: 23 mmol/L (ref 22–32)
Calcium: 8.9 mg/dL (ref 8.9–10.3)
Chloride: 106 mmol/L (ref 101–111)
Creatinine, Ser: 0.84 mg/dL (ref 0.44–1.00)
GFR calc Af Amer: >60 mL/min (ref >60)
GFR calc non Af Amer: 58 mL/min — ABNORMAL LOW (ref >60)
Glucose, Bld: 116 mg/dL — ABNORMAL HIGH (ref 65–99)
Potassium: 3.8 mmol/L (ref 3.5–5.1)
Sodium: 139 mmol/L (ref 135–145)
Total Bilirubin: 0.6 mg/dL (ref 0.3–1.2)
Total Protein: 6.1 g/dL — ABNORMAL LOW (ref 6.5–8.1)

## 2017-12-06 LAB — CBC WITH DIFFERENTIAL/PLATELET
Basophils Absolute: 0 10*3/uL (ref 0.0–0.1)
Basophils Relative: 0 %
Eosinophils Absolute: 0 10*3/uL (ref 0.0–0.7)
Eosinophils Relative: 0 %
HCT: 13.8 % — ABNORMAL LOW (ref 36.0–46.0)
Hemoglobin: 4 g/dL — CL (ref 12.0–15.0)
Lymphocytes Relative: 9 %
Lymphs Abs: 0.9 10*3/uL (ref 0.7–4.0)
MCH: 23.1 pg — ABNORMAL LOW (ref 26.0–34.0)
MCHC: 29 g/dL — ABNORMAL LOW (ref 30.0–36.0)
MCV: 79.8 fL (ref 78.0–100.0)
Monocytes Absolute: 0.9 10*3/uL (ref 0.1–1.0)
Monocytes Relative: 8 %
Neutro Abs: 9 10*3/uL — ABNORMAL HIGH (ref 1.7–7.7)
Neutrophils Relative %: 83 %
Platelets: 258 10*3/uL (ref 150–400)
RBC: 1.73 MIL/uL — ABNORMAL LOW (ref 3.87–5.11)
RDW: 18.5 % — ABNORMAL HIGH (ref 11.5–15.5)
WBC: 10.8 10*3/uL — ABNORMAL HIGH (ref 4.0–10.5)

## 2017-12-06 LAB — POC OCCULT BLOOD, ED: Fecal Occult Bld: POSITIVE — AB

## 2017-12-06 LAB — CK: Total CK: 113 U/L (ref 38–234)

## 2017-12-06 LAB — I-STAT TROPONIN, ED: Troponin i, poc: 0.27 ng/mL (ref 0.00–0.08)

## 2017-12-06 LAB — PROTIME-INR
INR: 3.27
Prothrombin Time: 33.1 seconds — ABNORMAL HIGH (ref 11.4–15.2)

## 2017-12-06 LAB — PREPARE RBC (CROSSMATCH)

## 2017-12-06 LAB — BRAIN NATRIURETIC PEPTIDE: B Natriuretic Peptide: 782.1 pg/mL — ABNORMAL HIGH (ref 0.0–100.0)

## 2017-12-06 LAB — ABO/RH: ABO/RH(D): O POS

## 2017-12-06 MED ORDER — ONDANSETRON HCL 4 MG PO TABS: 4.0000 mg | ORAL_TABLET | Freq: Four times a day (QID) | ORAL | Status: DC | PRN

## 2017-12-06 MED ORDER — ATORVASTATIN CALCIUM 10 MG PO TABS
10.0000 mg | ORAL_TABLET | Freq: Every day | ORAL | Status: DC
  Administered 2017-12-06 – 2017-12-16 (×11): 10 mg via ORAL
  Filled 2017-12-06 (×11): qty 1

## 2017-12-06 MED ORDER — FLUTICASONE PROPIONATE 50 MCG/ACT NA SUSP
2.0000 | Freq: Two times a day (BID) | NASAL | Status: DC
  Administered 2017-12-06 – 2017-12-17 (×22): 2 via NASAL
  Filled 2017-12-06: qty 16

## 2017-12-06 MED ORDER — SODIUM CHLORIDE 0.9 % IV SOLN
80.0000 mg | Freq: Once | INTRAVENOUS | Status: AC
  Administered 2017-12-06: 80 mg via INTRAVENOUS
  Filled 2017-12-06: qty 40

## 2017-12-06 MED ORDER — CALCIUM CARBONATE-VITAMIN D 500-200 MG-UNIT PO TABS
1.0000 | ORAL_TABLET | Freq: Every day | ORAL | Status: DC
  Administered 2017-12-06 – 2017-12-17 (×12): 1 via ORAL
  Filled 2017-12-06 (×13): qty 1

## 2017-12-06 MED ORDER — CARVEDILOL 3.125 MG PO TABS
3.1250 mg | ORAL_TABLET | Freq: Two times a day (BID) | ORAL | Status: DC
  Administered 2017-12-06 – 2017-12-11 (×10): 3.125 mg via ORAL
  Filled 2017-12-06 (×10): qty 1

## 2017-12-06 MED ORDER — DILTIAZEM HCL ER COATED BEADS 120 MG PO CP24
120.0000 mg | ORAL_CAPSULE | Freq: Every day | ORAL | Status: DC
  Administered 2017-12-06 – 2017-12-10 (×5): 120 mg via ORAL
  Filled 2017-12-06 (×5): qty 1

## 2017-12-06 MED ORDER — VITAMIN D 1000 UNITS PO TABS
1000.0000 [IU] | ORAL_TABLET | Freq: Every day | ORAL | Status: DC
  Administered 2017-12-06 – 2017-12-17 (×12): 1000 [IU] via ORAL
  Filled 2017-12-06 (×12): qty 1

## 2017-12-06 MED ORDER — SODIUM CHLORIDE 0.9% FLUSH
3.0000 mL | Freq: Two times a day (BID) | INTRAVENOUS | Status: DC
  Administered 2017-12-06 – 2017-12-17 (×19): 3 mL via INTRAVENOUS

## 2017-12-06 MED ORDER — LOPERAMIDE HCL 2 MG PO CAPS
2.0000 mg | ORAL_CAPSULE | Freq: Once | ORAL | Status: AC
  Administered 2017-12-06: 2 mg via ORAL
  Filled 2017-12-06: qty 1

## 2017-12-06 MED ORDER — LORATADINE 10 MG PO TABS
10.0000 mg | ORAL_TABLET | Freq: Every day | ORAL | Status: DC
  Administered 2017-12-06 – 2017-12-17 (×12): 10 mg via ORAL
  Filled 2017-12-06 (×12): qty 1

## 2017-12-06 MED ORDER — SODIUM CHLORIDE 0.9 % IV SOLN
8.0000 mg/h | INTRAVENOUS | Status: DC
  Administered 2017-12-06 – 2017-12-07 (×3): 8 mg/h via INTRAVENOUS
  Filled 2017-12-06 (×3): qty 80

## 2017-12-06 MED ORDER — CYCLOSPORINE 0.05 % OP EMUL
1.0000 [drp] | Freq: Two times a day (BID) | OPHTHALMIC | Status: DC
  Administered 2017-12-06 – 2017-12-17 (×22): 1 [drp] via OPHTHALMIC
  Filled 2017-12-06 (×24): qty 1

## 2017-12-06 MED ORDER — GABAPENTIN 300 MG PO CAPS
300.0000 mg | ORAL_CAPSULE | Freq: Two times a day (BID) | ORAL | Status: DC
  Administered 2017-12-07 – 2017-12-17 (×22): 300 mg via ORAL
  Filled 2017-12-06 (×22): qty 1

## 2017-12-06 MED ORDER — SODIUM CHLORIDE 0.9 % IV SOLN
10.0000 mL/h | Freq: Once | INTRAVENOUS | Status: AC
  Administered 2017-12-06: 10 mL/h via INTRAVENOUS

## 2017-12-06 MED ORDER — ONDANSETRON HCL 4 MG/2ML IJ SOLN
4.0000 mg | Freq: Four times a day (QID) | INTRAMUSCULAR | Status: DC | PRN
  Administered 2017-12-14: 4 mg via INTRAVENOUS
  Filled 2017-12-06: qty 2

## 2017-12-06 MED ORDER — GABAPENTIN 300 MG PO CAPS
900.0000 mg | ORAL_CAPSULE | Freq: Every day | ORAL | Status: DC
  Administered 2017-12-06 – 2017-12-16 (×11): 900 mg via ORAL
  Filled 2017-12-06 (×11): qty 3

## 2017-12-06 MED ORDER — PHYTONADIONE 1 MG/0.5 ML ORAL SOLUTION
2.0000 mg | Freq: Once | ORAL | Status: DC
  Filled 2017-12-06: qty 1

## 2017-12-06 MED ORDER — BOOST / RESOURCE BREEZE PO LIQD CUSTOM
1.0000 | Freq: Three times a day (TID) | ORAL | Status: DC
  Administered 2017-12-07 – 2017-12-12 (×15): 1 via ORAL
  Administered 2017-12-13 – 2017-12-14 (×3): via ORAL
  Administered 2017-12-16 (×2): 1 via ORAL

## 2017-12-06 MED ORDER — ACETAMINOPHEN 500 MG PO TABS
500.0000 mg | ORAL_TABLET | Freq: Four times a day (QID) | ORAL | Status: DC | PRN
  Administered 2017-12-07 – 2017-12-17 (×15): 500 mg via ORAL
  Filled 2017-12-06 (×18): qty 1

## 2017-12-06 MED ORDER — COLESTIPOL HCL 1 G PO TABS
1.0000 g | ORAL_TABLET | Freq: Three times a day (TID) | ORAL | Status: DC
  Administered 2017-12-06 – 2017-12-17 (×33): 1 g via ORAL
  Filled 2017-12-06 (×36): qty 1

## 2017-12-06 NOTE — ED Triage Notes (Signed)
Pt arrives via EMS from [facility] with complaints of SOB for a week, worse with exertion. Denies any pain. Hx of Afib. EMS placed on 2L [place] for comfort.

## 2017-12-06 NOTE — ED Provider Notes (Signed)
[facility] EMERGENCY DEPARTMENT Provider Note   CSN: [id number] Arrival date & time: 12/06/17  1028     History   Chief Complaint No chief complaint on file.   HPI   Blood pressure 105/67, pulse 90, resp. rate 20, SpO2 99 %.  [name] is a 84 y.o. female past medical history significant for CAD, PAF (on coumadin), BIBEMS from independent living at [facility] for shortness of breath x5 days, dyspnea on exertion and also when walking worsening over the course of the last few days.  She denies any increasing peripheral edema, orthopnea, PND, chest pain, cough, fever chills.  She also states that she had a fall last night, she cannot explain to me how she fell, she thinks it was in the kitchen and then she scooted to her bedroom, she initiated her help button and someone came to her room to help get her up.  She states that she has had some hip pain, she is unclear if there was any head trauma she denies any nausea, vomiting, change in vision or weakness.  Had her INR checked 5 days ago when she cannot remember the number but she was instructed to take extra for 1 day and then go back to her regular dosage.  She has had intermittently darker stools over the last 10 days.  No large volume diarrhea.   Cards: [facility]: [facility]  Past Medical History:  Diagnosis Date  . Abnormality of gait 09/07/2015  . Anemia, iron deficiency 09/16/2014  . Anxiety   . CAD (coronary artery disease)    a. 08/2014 NSTEMI/Cath: mild Ca2+ or RCA ostium, otw nl cors. CO 3.3 L/min (thermo), 3.7 L/min ([place]).  . Carotid arterial disease ([place]) 07/30/2012   carotid doppler; normal study  . Chronic anticoagulation, with coumadin, with PAF and CHADS2Vasc2 score of 4 09/16/2014  . Degenerative arthritis   . Diverticulosis   . Essential hypertension   . Foot fracture, left   . GERD (gastroesophageal reflux disease)   . History of nuclear stress test 09/19/2007   normal pattern of perfusion;  post-stress EF 86%; EKG negative for ischemia; low risk scan  . Hyperlipidemia   . Left carotid bruit    a. 07/2015 Carotid U/S: 1-39% bilat ICA stenosis.  . Memory disorder 03/05/2014  . Mild renal insufficiency   . Mitral prolapse   . Neuropathy   . PAF (paroxysmal atrial fibrillation) (HCC)    a. 08/2014-->Coumadin (CHA2DS2VASc = 4).  . Peripheral neuropathy    Small fiber   . Pre-syncope    a. 04/2015 in setting of bradycardia-->CCB/BB doses adjusted.  . Pulmonary hypertension ([place])   . Valvular heart disease    a. 04/2015 Echo: EF 65-70%, no rwma, Gr1 DD, mild AS, triv AI, mild MS, mod MR, mod dil LA, mod TR, sev increased PASP.    Patient Active Problem List   Diagnosis Date Noted  . Pelvic mass in female 02/19/2017  . CAD (coronary artery disease)   . Abnormality of gait 09/07/2015  . Left carotid bruit 08/10/2015  . Acute pulmonary edema (HCC)   . Hyperkalemia 05/08/2015  . (HFpEF) heart failure with preserved ejection fraction ([place]) 05/08/2015  . Chest pain 05/08/2015  . ARF (acute renal failure) ([place]) 05/08/2015  . Renal failure 05/08/2015  . Long-term (current) use of anticoagulants 09/20/2014  . PAF (paroxysmal atrial fibrillation) ([place]) 09/16/2014  . Chronic anticoagulation, with coumadin, with PAF and CHADS2Vasc2 score of 4 09/16/2014  .  S/P cardiac cath 09/14/14 mild calcification of ostial RCA but otherwise normal coronary arteries 09/16/2014  . Anemia, iron deficiency 09/16/2014  . Non-ST elevation myocardial infarction (NSTEMI), initial care episode, secondary to PAF most likely 09/13/2014  . NSTEMI (non-ST elevated myocardial infarction) ([place]) 09/13/2014  . Memory disorder 03/05/2014  . Pulmonary hypertension ([place]) 12/10/2013  . Severe tricuspid regurgitation 12/10/2013  . Non-rheumatic mitral regurgitation 12/10/2013  . Dyspnea on exertion 11/03/2013  . Edema of both legs 11/03/2013  . Valvular heart disease 11/03/2013  . Bradycardia 09/02/2013  . Mild  aortic stenosis 03/03/2013  . Moderate to severe pulmonary hypertension ([place]) 03/03/2013  . Essential hypertension 03/03/2013  . Mixed hyperlipidemia 03/03/2013  . Polyneuropathy in other diseases classified elsewhere ([place]) 11/10/2012    Past Surgical History:  Procedure Laterality Date  .  Bilateral bunionectomies    . ABDOMINAL HYSTERECTOMY    . APPENDECTOMY    . DILATION AND CURETTAGE OF UTERUS    . FOOT FRACTURE SURGERY Left   . gallbladder resection    . LEFT HEART CATHETERIZATION WITH CORONARY ANGIOGRAM N/A 09/14/2014   Procedure: LEFT HEART CATHETERIZATION WITH CORONARY ANGIOGRAM;  Surgeon: Troy Sine, MD;  Location: [facility] CATH LAB;  Service: Cardiovascular;  Laterality: N/A;  . REPLACEMENT TOTAL KNEE Right   . resuspension procedure    . ROBOTIC ASSISTED BILATERAL SALPINGO OOPHERECTOMY Bilateral 02/19/2017   Procedure: XI ROBOTIC ASSISTED BILATERAL SALPINGO OOPHORECTOMY AND LYSIS OF ADHESION;  Surgeon: Everitt Amber, MD;  Location: [facility] ORS;  Service: Gynecology;  Laterality: Bilateral;  . TEAR DUCT PROBING WITH STRABISMUS REPAIR     Tear duct repair surgery  . TONSILLECTOMY    . UMBILICAL HERNIA REPAIR       OB History   None      Home Medications    Prior to Admission medications   Medication Sig Start Date End Date Taking? Authorizing Provider  acetaminophen (TYLENOL) 500 MG tablet Take 500 mg by mouth every 6 (six) hours as needed for moderate pain.    [provider]  atorvastatin (LIPITOR) 20 MG tablet Take 0.5 tablets (10 mg total) by mouth at bedtime. 06/06/17   Troy Sine, MD  Calcium Carb-Cholecalciferol (CALCIUM 600+D) 600-800 MG-UNIT TABS Take 1 tablet by mouth daily.    [provider]  carvedilol (COREG) 3.125 MG tablet TAKE 1 TABLET (3.125 MG TOTAL) BY MOUTH 2 (TWO) TIMES DAILY. 06/20/17   Troy Sine, MD  cetirizine (ZYRTEC) 10 MG tablet Take 10 mg by mouth daily with breakfast.     [provider]  cholecalciferol  (VITAMIN D) 1000 UNITS tablet Take 1,000 Units by mouth daily.      [provider]  colestipol (COLESTID) 1 G tablet Take 1 g by mouth 3 (three) times daily. For diarrhea and does not take at the same time as coumadin    [provider]  cycloSPORINE (RESTASIS) 0.05 % ophthalmic emulsion Place 1 drop into both eyes 2 (two) times daily.     [provider]  diltiazem (CARDIZEM CD) 120 MG 24 hr capsule Take 1 capsule (120 mg total) by mouth daily. 08/29/17 11/27/17  Troy Sine, MD  diltiazem (CARDIZEM SR) 60 MG 12 hr capsule TAKE 1 CAPSULE BY MOUTH EVERY 12 HOURS 11/21/17   Leonie Man, MD  [facility] EX) Apply 1 application topically 2 (two) times daily.    [provider]  Flaxseed, Linseed, (FLAX SEED OIL) 1000 MG CAPS Take 1  capsule by mouth 3 (three) times daily.    [provider]  fluticasone (FLONASE) 50 MCG/ACT nasal spray Place 2 sprays into both nostrils 2 (two) times daily.    [provider]  gabapentin (NEURONTIN) 300 MG capsule TAKE 1 CAPSULE THREE TIMES DAILY AND 3 CAPSULES AT BEDTIME Patient taking differently: Take 300 mg in the morning and at lunch, then take 900 mg at bedtime 11/03/15   Dohmeier, Asencion Partridge, MD  hydrALAZINE (APRESOLINE) 25 MG tablet Take 1 tablet (25 mg total) by mouth 3 (three) times daily. 10/21/17   Lendon Colonel, NP  hydrochlorothiazide (HYDRODIURIL) 25 MG tablet Take 1 tablet (25 mg total) daily by mouth. 07/02/17   Troy Sine, MD  LACTOBACILLUS RHAMNOSUS, GG, PO Take 1 capsule by mouth daily.    [provider]  levETIRAcetam (KEPPRA) 250 MG tablet One tablet in the morning and 2 in the evening 10/09/17   Kathrynn Ducking, MD  loperamide (IMODIUM A-D) 2 MG tablet Take 2 mg by mouth daily with breakfast.    [provider]  memantine (NAMENDA) 10 MG tablet TAKE 1 TABLET TWICE DAILY 10/30/17   Kathrynn Ducking, MD  mirtazapine (REMERON) 7.5 MG tablet  08/07/17    [provider]  Multiple Vitamin (MULTIVITAMIN) tablet Take 1 tablet by mouth daily.      [provider]  pantoprazole (PROTONIX) 40 MG tablet Take 40 mg by mouth daily.  11/15/15   [provider]  PEPPERMINT OIL PO Take 1 Dose by mouth 2 (two) times daily. 1 dose in the morning and at bedtime    [provider]  pyridOXINE (VITAMIN B-6) 100 MG tablet Take 100 mg by mouth daily.    [provider]  ranitidine (ZANTAC) 150 MG tablet Take 150 mg by mouth 2 (two) times daily.  07/26/16   [provider]  simethicone (MYLICON) 474 MG chewable tablet Chew 125 mg by mouth as needed for flatulence.    [provider]  spironolactone (ALDACTONE) 25 MG tablet Take 0.5 tablets (12.5 mg total) by mouth daily. 11/08/17 02/06/18  Troy Sine, MD  vitamin B-12 (CYANOCOBALAMIN) 1000 MCG tablet Take 1,000 mcg by mouth daily.    [provider]  warfarin (COUMADIN) 2 MG tablet TAKE 1 AND 1/2 TO 2 TABLETS DAILY AS DIRECTED BY COUMADIN CLINIC 07/16/17   Croitoru, Dani Gobble, MD    Family History Family History  Problem Relation Age of Onset  . Pneumonia Mother   . Coronary artery disease Mother   . Hypertension Mother   . Heart disease Father   . Cancer Father   . Hypertension Father   . Arthritis Sister     Social History Social History   Tobacco Use  . Smoking status: Former Smoker    Types: Cigarettes  . Smokeless tobacco: Never Used  . Tobacco comment: quit many mnay years ago   Substance Use Topics  . Alcohol use: No  . Drug use: No     Allergies   Avelox [moxifloxacin hcl in nacl]; Moxifloxacin; Aricept [donepezil hcl]; Codeine; Donepezil; Garlic; Latex; Onion; Sulfa drugs cross reactors; Other; Duloxetine; and Polysporin [bacitracin-polymyxin b]   Review of Systems Review of Systems   Physical Exam Updated Vital Signs BP 94/67   Pulse 89   Temp 97.9 F (36.6 C) (Oral)   Resp (!) 22   Ht 5\' 2"  (1.575 m)    Wt 52.6 kg (116 lb)   SpO2 100%  BMI 21.22 kg/m   Physical Exam  Constitutional: She is oriented to person, place, and time. She appears well-developed and well-nourished. No distress.  HENT:  Head: Normocephalic and atraumatic.  Mouth/Throat: Oropharynx is clear and moist.  ++ conjunctival pallor  No abrasions or contusions.   No hemotympanum, battle signs or raccoon's eyes  No crepitance or tenderness to palpation along the orbital rim.  EOMI intact with no pain or diplopia  No abnormal otorrhea or rhinorrhea. Nasal septum midline.  No intraoral trauma.     Eyes: Pupils are equal, round, and reactive to light. Conjunctivae and EOM are normal.  Neck: Normal range of motion. No JVD present. No tracheal deviation present.  Cardiovascular: Normal rate, regular rhythm and intact distal pulses.  Radial pulse equal bilaterally  Pulmonary/Chest: Effort normal and breath sounds normal. No stridor. No respiratory distress. She has no wheezes. She has no rales. She exhibits no tenderness.  Abdominal: Soft. She exhibits no distension and no mass. There is no tenderness. There is no rebound and no guarding.  Genitourinary:  Genitourinary Comments: Total rectal exam with normal rectal tone, dark chalky stool  Musculoskeletal: Normal range of motion. She exhibits no edema or tenderness.  No calf asymmetry, superficial collaterals, palpable cords, edema, Homans sign negative bilaterally.    Neurological: She is alert and oriented to person, place, and time.  Skin: Skin is warm. She is not diaphoretic.  Psychiatric: She has a normal mood and affect.  Nursing note and vitals reviewed.    ED Treatments / Results  Labs (all labs ordered are listed, but only abnormal results are displayed) Labs Reviewed  CBC WITH DIFFERENTIAL/PLATELET - Abnormal; Notable for the following components:      Result Value   WBC 10.8 (*)    RBC 1.73 (*)    Hemoglobin 4.0 (*)    HCT 13.8 (*)    MCH 23.1  (*)    MCHC 29.0 (*)    RDW 18.5 (*)    Neutro Abs 9.0 (*)    All other components within normal limits  COMPREHENSIVE METABOLIC PANEL - Abnormal; Notable for the following components:   Glucose, Bld 116 (*)    BUN 52 (*)    Total Protein 6.1 (*)    Albumin 3.2 (*)    GFR calc non Af Amer 58 (*)    All other components within normal limits  BRAIN NATRIURETIC PEPTIDE - Abnormal; Notable for the following components:   B Natriuretic Peptide 782.1 (*)    All other components within normal limits  PROTIME-INR - Abnormal; Notable for the following components:   Prothrombin Time 33.1 (*)    All other components within normal limits  TROPONIN I - Abnormal; Notable for the following components:   Troponin I 0.24 (*)    All other components within normal limits  I-STAT TROPONIN, ED - Abnormal; Notable for the following components:   Troponin i, poc 0.27 (*)    All other components within normal limits  POC OCCULT BLOOD, ED - Abnormal; Notable for the following components:   Fecal Occult Bld POSITIVE (*)    All other components within normal limits  CK  I-STAT CHEM 8, ED  TYPE AND SCREEN  PREPARE RBC (CROSSMATCH)  ABO/RH    EKG None  Radiology Dg Chest 2 View  Result Date: 12/06/2017 CLINICAL DATA:  Shortness of breath EXAM: CHEST - 2 VIEW COMPARISON:  August 02, 2016 FINDINGS: There is no edema or consolidation. Heart is borderline  enlarged with pulmonary vascularity within normal limits. There is aortic atherosclerosis. No adenopathy. No evident bone lesions. IMPRESSION: Aortic atherosclerosis. Heart borderline enlarged. No edema or consolidation. Electronically Signed   By: Lowella Grip III M.D.   On: 12/06/2017 11:06   Ct Head Wo Contrast  Result Date: 12/06/2017 CLINICAL DATA:  [place] last night at assisted living facility, no loss of consciousness; having chest pain EXAM: CT HEAD WITHOUT CONTRAST CT CERVICAL SPINE WITHOUT CONTRAST TECHNIQUE: Multidetector CT imaging of  the head and cervical spine was performed following the standard protocol without intravenous contrast. Multiplanar CT image reconstructions of the cervical spine were also generated. COMPARISON:  CT head 06/04/2016 FINDINGS: CT HEAD FINDINGS Brain: Generalized atrophy. Normal ventricular morphology. No midline shift or mass effect. Minimal small vessel chronic ischemic changes of deep cerebral white matter. No intracranial hemorrhage, mass lesion or evidence of acute infarction. No extra-axial fluid collections. Vascular: Mild atherosclerotic calcification within internal carotid arteries at skull base Skull: Intact Sinuses/Orbits: Partial opacification of ethmoid air cells and frontal sinus. Small air-fluid levels in maxillary sinuses bilaterally. Other: N/A CT CERVICAL SPINE FINDINGS Alignment: Mild anterolisthesis at C4-C5 with minimal retrolisthesis at C5-C6 and C6-C7 Skull base and vertebrae: Diffuse osseous demineralization. Visualized skull base intact. Multilevel advanced facet degenerative changes. Advanced degenerative disc disease changes at C5-C6 and C6-C7 with disc space narrowing and endplate spur formation. Vertebral body heights maintained without fracture or bone destruction. Soft tissues and spinal canal: Prevertebral soft tissues normal thickness. Disc levels:  Mild pseudo disc at C4-C5 Upper chest: Emphysematous changes at lung apices Other: N/A IMPRESSION: Atrophy with minimal small vessel chronic ischemic changes of deep cerebral white matter. No acute intracranial abnormalities. Scattered sinus disease changes as above including small air-fluid levels in the maxillary sinuses. Degenerative disc and facet disease changes of the cervical spine. No acute cervical spine abnormalities. Electronically Signed   By: Lavonia Dana M.D.   On: 12/06/2017 12:35   Ct Cervical Spine Wo Contrast  Result Date: 12/06/2017 CLINICAL DATA:  [place] last night at assisted living facility, no loss of consciousness;  having chest pain EXAM: CT HEAD WITHOUT CONTRAST CT CERVICAL SPINE WITHOUT CONTRAST TECHNIQUE: Multidetector CT imaging of the head and cervical spine was performed following the standard protocol without intravenous contrast. Multiplanar CT image reconstructions of the cervical spine were also generated. COMPARISON:  CT head 06/04/2016 FINDINGS: CT HEAD FINDINGS Brain: Generalized atrophy. Normal ventricular morphology. No midline shift or mass effect. Minimal small vessel chronic ischemic changes of deep cerebral white matter. No intracranial hemorrhage, mass lesion or evidence of acute infarction. No extra-axial fluid collections. Vascular: Mild atherosclerotic calcification within internal carotid arteries at skull base Skull: Intact Sinuses/Orbits: Partial opacification of ethmoid air cells and frontal sinus. Small air-fluid levels in maxillary sinuses bilaterally. Other: N/A CT CERVICAL SPINE FINDINGS Alignment: Mild anterolisthesis at C4-C5 with minimal retrolisthesis at C5-C6 and C6-C7 Skull base and vertebrae: Diffuse osseous demineralization. Visualized skull base intact. Multilevel advanced facet degenerative changes. Advanced degenerative disc disease changes at C5-C6 and C6-C7 with disc space narrowing and endplate spur formation. Vertebral body heights maintained without fracture or bone destruction. Soft tissues and spinal canal: Prevertebral soft tissues normal thickness. Disc levels:  Mild pseudo disc at C4-C5 Upper chest: Emphysematous changes at lung apices Other: N/A IMPRESSION: Atrophy with minimal small vessel chronic ischemic changes of deep cerebral white matter. No acute intracranial abnormalities. Scattered sinus disease changes as above including small air-fluid levels in the maxillary sinuses. Degenerative disc  and facet disease changes of the cervical spine. No acute cervical spine abnormalities. Electronically Signed   By: Lavonia Dana M.D.   On: 12/06/2017 12:35     Procedures Procedures (including critical care time)  CRITICAL CARE Performed by: Elmyra Ricks Lenoir Facchini   Total critical care time: 35 minutes  Critical care time was exclusive of separately billable procedures and treating other patients.  Critical care was necessary to treat or prevent imminent or life-threatening deterioration.  Critical care was time spent personally by me on the following activities: development of treatment plan with patient and/or surrogate as well as nursing, discussions with consultants, evaluation of patient's response to treatment, examination of patient, obtaining history from patient or surrogate, ordering and performing treatments and interventions, ordering and review of laboratory studies, ordering and review of radiographic studies, pulse oximetry and re-evaluation of patient's condition.   Medications Ordered in ED Medications  pantoprazole (PROTONIX) 80 mg in sodium chloride 0.9 % 250 mL (0.32 mg/mL) infusion (8 mg/hr Intravenous New Bag/Given 12/06/17 1242)  0.9 %  sodium chloride infusion (10 mL/hr Intravenous New Bag/Given 12/06/17 1244)  pantoprazole (PROTONIX) 80 mg in sodium chloride 0.9 % 100 mL IVPB (80 mg Intravenous New Bag/Given 12/06/17 1243)     Initial Impression / Assessment and Plan / ED Course  I have reviewed the triage vital signs and the nursing notes.  Pertinent labs & imaging results that were available during my care of the patient were reviewed by me and considered in my medical decision making (see chart for details).     Vitals:   12/06/17 1027 12/06/17 1049 12/06/17 1115 12/06/17 1200  BP: 105/67  (!) 86/51 94/67  Pulse: 90  95 89  Resp: 20  19 (!) 22  Temp:  97.9 F (36.6 C)    TempSrc:  Oral    SpO2: 99%  100% 100%  Weight:  52.6 kg (116 lb)    Height:  5\' 2"  (1.575 m)      Medications  pantoprazole (PROTONIX) 80 mg in sodium chloride 0.9 % 250 mL (0.32 mg/mL) infusion (8 mg/hr Intravenous New Bag/Given  12/06/17 1242)  0.9 %  sodium chloride infusion (10 mL/hr Intravenous New Bag/Given 12/06/17 1244)  pantoprazole (PROTONIX) 80 mg in sodium chloride 0.9 % 100 mL IVPB (80 mg Intravenous New Bag/Given 12/06/17 1243)    SAMANATHA BRAMMER is 84 y.o. female presenting with dyspnea on exertion worsening over the course of the last 5 days.  She is anticoagulated with Coumadin for paroxysmal A. fib.  She had a fall last night, no gross head trauma, given her chronic anticoagulation will image head and C-spine.  Patient is found to be anemic with a hemoglobin of 4.  She has no religious objections to transfusions.  She is reporting melanotic stool, guaiac is positive.  Likely upper GI bleed.  Patient started on Protonix bolus and drip.  Troponin is elevated at 0.27, likely secondary to demand, EKG with no ischemic findings.  Patient is beta blocked with carvedilol  BUN is elevated at over 50 consistent with upper GI bleed.  Patient is put in for 4 units packed red blood cells.  Gastroenterology consult from Dr. Watt Climes appreciated: Agrees with care plan and their team will see her non-emergently.  Admitted to Dr. Rockne Menghini  Final Clinical Impressions(s) / ED Diagnoses   Final diagnoses:  Upper GI bleeding  Chronic anticoagulation  Anemia due to acute blood loss    ED Discharge Orders    None  Monico Blitz, Hershal Coria 12/06/17 1346    Malvin Johns, MD 12/06/17 1432

## 2017-12-06 NOTE — ED Notes (Signed)
Patient transported to CT 

## 2017-12-06 NOTE — ED Notes (Signed)
Patient transported to X-ray 

## 2017-12-06 NOTE — Consult Note (Signed)
Reason for Consult:GI blood loss Referring Physician: Hospital team  [name] is an 84 y.o. female.  HPI: patient well-known to me from years of outpatient care has been doing well until some increased shortness of breath lately and she has seen occasional dark stools some tarry but her Colestid and Imodium A-D helps her diarrhea but she did fall yesterday and came to the hospital was found to have a hemoglobin of 4 from a baseline of probably 10 or 11 and increased BUN and increased INR that she does not have much upper tract symptoms and no abdominal pain and uses Tylenol over-the-counter but no aspirin or nonsteroidals and has no other complaints and her hospital computer chart and our office computer chart was reviewed and his CT a few years ago was okayand her last endoscopy was 2014 and her last colonoscopy 2011 with hiatal hernia diverticuli but no other significant findings  Past Medical History:  Diagnosis Date  . Abnormality of gait 09/07/2015  . Anemia, iron deficiency 09/16/2014  . Anxiety   . CAD (coronary artery disease)    a. 08/2014 NSTEMI/Cath: mild Ca2+ or RCA ostium, otw nl cors. CO 3.3 L/min (thermo), 3.7 L/min (Fick).  . Carotid arterial disease (HCC) 07/30/2012   carotid doppler; normal study  . Chronic anticoagulation, with coumadin, with PAF and CHADS2Vasc2 score of 4 09/16/2014  . Degenerative arthritis   . Diverticulosis   . Essential hypertension   . Foot fracture, left   . GERD (gastroesophageal reflux disease)   . History of nuclear stress test 09/19/2007   normal pattern of perfusion; post-stress EF 86%; EKG negative for ischemia; low risk scan  . Hyperlipidemia   . Left carotid bruit    a. 07/2015 Carotid U/S: 1-39% bilat ICA stenosis.  . Memory disorder 03/05/2014  . Mild renal insufficiency   . Mitral prolapse   . Neuropathy   . PAF (paroxysmal atrial fibrillation) (HCC)    a. 08/2014-->Coumadin (CHA2DS2VASc = 4).  . Peripheral neuropathy    Small  fiber   . Pre-syncope    a. 04/2015 in setting of bradycardia-->CCB/BB doses adjusted.  . Pulmonary hypertension (HCC)   . Valvular heart disease    a. 04/2015 Echo: EF 65-70%, no rwma, Gr1 DD, mild AS, triv AI, mild MS, mod MR, mod dil LA, mod TR, sev increased PASP.    Past Surgical History:  Procedure Laterality Date  .  Bilateral bunionectomies    . ABDOMINAL HYSTERECTOMY    . APPENDECTOMY    . DILATION AND CURETTAGE OF UTERUS    . FOOT FRACTURE SURGERY Left   . gallbladder resection    . LEFT HEART CATHETERIZATION WITH CORONARY ANGIOGRAM N/A 09/14/2014   Procedure: LEFT HEART CATHETERIZATION WITH CORONARY ANGIOGRAM;  Surgeon: Thomas A Kelly, MD;  Location: [facility];  Service: Cardiovascular;  Laterality: N/A;  . REPLACEMENT TOTAL KNEE Right   . resuspension procedure    . ROBOTIC ASSISTED BILATERAL SALPINGO OOPHERECTOMY Bilateral 02/19/2017   Procedure: XI ROBOTIC ASSISTED BILATERAL SALPINGO OOPHORECTOMY AND LYSIS OF ADHESION;  Surgeon: Rossi, Emma, MD;  Location: [facility];  Service: Gynecology;  Laterality: Bilateral;  . TEAR DUCT PROBING WITH STRABISMUS REPAIR     Tear duct repair surgery  . TONSILLECTOMY    . UMBILICAL HERNIA REPAIR      Family History  Problem Relation Age of Onset  . Pneumonia Mother   . Coronary artery disease Mother   . Hypertension Mother   . Heart disease   Father   . Cancer Father   . Hypertension Father   . Arthritis Sister     Social History:  reports that she has quit smoking. Her smoking use included cigarettes. She has never used smokeless tobacco. She reports that she does not drink alcohol or use drugs.  Allergies:  Allergies  Allergen Reactions  . Avelox [Moxifloxacin Hcl In Nacl] Shortness Of Breath and Swelling  . Moxifloxacin Other (See Comments), Shortness Of Breath and Swelling    other  . Aricept [Donepezil Hcl] Diarrhea  . Codeine Swelling       . Donepezil Diarrhea  . Garlic Other (See Comments)    Gi problems  . Latex  Hives, Itching, Rash and Other (See Comments)    Watery blisters   . Onion Other (See Comments)    Gi problems  . Sulfa Drugs Cross Reactors Swelling  . Other Other (See Comments)    Her throat closes up  . Duloxetine Rash  . Polysporin [Bacitracin-Polymyxin B] Other (See Comments)    other    Medications: I have reviewed the patient's current medications.  Results for orders placed or performed during the hospital encounter of 12/06/17 (from the past 48 hour(s))  CBC with Differential     Status: Abnormal   Collection Time: 12/06/17 10:52 AM  Result Value Ref Range   WBC 10.8 (H) 4.0 - 10.5 K/uL   RBC 1.73 (L) 3.87 - 5.11 MIL/uL   Hemoglobin 4.0 (LL) 12.0 - 15.0 g/dL    Comment: REPEATED TO VERIFY CRITICAL RESULT CALLED TO, READ BACK BY AND VERIFIED WITH: M PETERS RN [id number] 12/06/2017 BY A BENNETT    HCT 13.8 (L) 36.0 - 46.0 %   MCV 79.8 78.0 - 100.0 fL   MCH 23.1 (L) 26.0 - 34.0 pg   MCHC 29.0 (L) 30.0 - 36.0 g/dL   RDW 18.5 (H) 11.5 - 15.5 %   Platelets 258 150 - 400 K/uL   Neutrophils Relative % 83 %   Neutro Abs 9.0 (H) 1.7 - 7.7 K/uL   Lymphocytes Relative 9 %   Lymphs Abs 0.9 0.7 - 4.0 K/uL   Monocytes Relative 8 %   Monocytes Absolute 0.9 0.1 - 1.0 K/uL   Eosinophils Relative 0 %   Eosinophils Absolute 0.0 0.0 - 0.7 K/uL   Basophils Relative 0 %   Basophils Absolute 0.0 0.0 - 0.1 K/uL    Comment: Performed at [facility], [address] [place], [place] [address]  Comprehensive metabolic panel     Status: Abnormal   Collection Time: 12/06/17 10:52 AM  Result Value Ref Range   Sodium 139 135 - 145 mmol/L   Potassium 3.8 3.5 - 5.1 mmol/L   Chloride 106 101 - 111 mmol/L   CO2 23 22 - 32 mmol/L   Glucose, Bld 116 (H) 65 - 99 mg/dL   BUN 52 (H) 6 - 20 mg/dL   Creatinine, Ser 0.84 0.44 - 1.00 mg/dL   Calcium 8.9 8.9 - 10.3 mg/dL   Total Protein 6.1 (L) 6.5 - 8.1 g/dL   Albumin 3.2 (L) 3.5 - 5.0 g/dL   AST 32 15 - 41 U/L   ALT 15 14 - 54 U/L   Alkaline  Phosphatase 40 38 - 126 U/L   Total Bilirubin 0.6 0.3 - 1.2 mg/dL   GFR calc non Af Amer 58 (L) >60 mL/min   GFR calc Af Amer >60 >60 mL/min    Comment: (NOTE) The eGFR   has been calculated using the CKD EPI equation. This calculation has not been validated in all clinical situations. eGFR's persistently <60 mL/min signify possible Chronic Kidney Disease.    Anion gap 10 5 - 15    Comment: Performed at [facility] [address] [place], [place] [address]  Brain natriuretic peptide     Status: Abnormal   Collection Time: 12/06/17 10:52 AM  Result Value Ref Range   B Natriuretic Peptide 782.1 (H) 0.0 - 100.0 pg/mL    Comment: Performed at [facility] [address] [place], [place] [address]  Protime-INR     Status: Abnormal   Collection Time: 12/06/17 10:52 AM  Result Value Ref Range   Prothrombin Time 33.1 (H) 11.4 - 15.2 seconds   INR 3.27     Comment: Performed at [facility] [address], [place], [place] [address]  CK     Status: None   Collection Time: 12/06/17 10:52 AM  Result Value Ref Range   Total CK 113 38 - 234 U/L    Comment: Performed at [facility], [place] [address], [place], [place] [address]  I-stat troponin, ED     Status: Abnormal   Collection Time: 12/06/17 10:59 AM  Result Value Ref Range   Troponin i, poc 0.27 (HH) 0.00 - 0.08 ng/mL   Comment NOTIFIED PHYSICIAN    Comment 3            Comment: Due to the release kinetics of cTnI, a negative result within the first hours of the onset of symptoms does not rule out myocardial infarction with certainty. If myocardial infarction is still suspected, repeat the test at appropriate intervals.   Type and screen     Status: None (Preliminary result)   Collection Time: 12/06/17 11:30 AM  Result Value Ref Range   ABO/RH(D) O POS    Antibody Screen NEG    Sample Expiration 12/09/2017    Unit Number [id number]    Blood Component Type RED CELLS,LR    Unit division 00     Status of Unit ALLOCATED    Transfusion Status OK TO TRANSFUSE    Crossmatch Result Compatible    Unit Number [id number]    Blood Component Type RED CELLS,LR    Unit division 00    Status of Unit ISSUED    Transfusion Status OK TO TRANSFUSE    Crossmatch Result      Compatible Performed at [facility], [place] [address], [place], [place] [address]    Unit Number [id number]    Blood Component Type RED CELLS,LR    Unit division 00    Status of Unit ALLOCATED    Transfusion Status OK TO TRANSFUSE    Crossmatch Result Compatible    Unit Number [id number]    Blood Component Type RED CELLS,LR    Unit division 00    Status of Unit ALLOCATED    Transfusion Status OK TO TRANSFUSE    Crossmatch Result Compatible   Prepare RBC     Status: None   Collection Time: 12/06/17 11:30 AM  Result Value Ref Range   Order Confirmation      ORDER PROCESSED BY BLOOD BANK Performed at [facility], [place] [address], [place], [place] [address]   ABO/Rh     Status: None   Collection Time: 12/06/17 11:30 AM  Result Value Ref Range   ABO/RH(D)      O POS  Performed at [facility], [address] [place], [place] [address]   POC occult blood, ED     Status: Abnormal   Collection Time: 12/06/17 11:32 AM  Result Value Ref Range   Fecal Occult Bld POSITIVE (A) NEGATIVE  Troponin I     Status: Abnormal   Collection Time: 12/06/17 11:58 AM  Result Value Ref Range   Troponin I 0.24 (HH) <0.03 ng/mL    Comment: CRITICAL RESULT CALLED TO, READ BACK BY AND VERIFIED WITH: M.SCRUGGS,RN 1320 12/06/17 CLARK,S Performed at [facility], [address] [place], [place] [address]     Dg Chest 2 View  Result Date: 12/06/2017 CLINICAL DATA:  Shortness of breath EXAM: CHEST - 2 VIEW COMPARISON:  August 02, 2016 FINDINGS: There is no edema or consolidation. Heart is borderline enlarged with pulmonary vascularity within normal limits. There is aortic atherosclerosis. No adenopathy.  No evident bone lesions. IMPRESSION: Aortic atherosclerosis. Heart borderline enlarged. No edema or consolidation. Electronically Signed   By: William  Woodruff III M.D.   On: 12/06/2017 11:06   Ct Head Wo Contrast  Result Date: 12/06/2017 CLINICAL DATA:  Fell last night at assisted living facility, no loss of consciousness; having chest pain EXAM: CT HEAD WITHOUT CONTRAST CT CERVICAL SPINE WITHOUT CONTRAST TECHNIQUE: Multidetector CT imaging of the head and cervical spine was performed following the standard protocol without intravenous contrast. Multiplanar CT image reconstructions of the cervical spine were also generated. COMPARISON:  CT head 06/04/2016 FINDINGS: CT HEAD FINDINGS Brain: Generalized atrophy. Normal ventricular morphology. No midline shift or mass effect. Minimal small vessel chronic ischemic changes of deep cerebral white matter. No intracranial hemorrhage, mass lesion or evidence of acute infarction. No extra-axial fluid collections. Vascular: Mild atherosclerotic calcification within internal carotid arteries at skull base Skull: Intact Sinuses/Orbits: Partial opacification of ethmoid air cells and frontal sinus. Small air-fluid levels in maxillary sinuses bilaterally. Other: N/A CT CERVICAL SPINE FINDINGS Alignment: Mild anterolisthesis at C4-C5 with minimal retrolisthesis at C5-C6 and C6-C7 Skull base and vertebrae: Diffuse osseous demineralization. Visualized skull base intact. Multilevel advanced facet degenerative changes. Advanced degenerative disc disease changes at C5-C6 and C6-C7 with disc space narrowing and endplate spur formation. Vertebral body heights maintained without fracture or bone destruction. Soft tissues and spinal canal: Prevertebral soft tissues normal thickness. Disc levels:  Mild pseudo disc at C4-C5 Upper chest: Emphysematous changes at lung apices Other: N/A IMPRESSION: Atrophy with minimal small vessel chronic ischemic changes of deep cerebral white matter. No  acute intracranial abnormalities. Scattered sinus disease changes as above including small air-fluid levels in the maxillary sinuses. Degenerative disc and facet disease changes of the cervical spine. No acute cervical spine abnormalities. Electronically Signed   By: Mark  Boles M.D.   On: 12/06/2017 12:35   Ct Cervical Spine Wo Contrast  Result Date: 12/06/2017 CLINICAL DATA:  Fell last night at assisted living facility, no loss of consciousness; having chest pain EXAM: CT HEAD WITHOUT CONTRAST CT CERVICAL SPINE WITHOUT CONTRAST TECHNIQUE: Multidetector CT imaging of the head and cervical spine was performed following the standard protocol without intravenous contrast. Multiplanar CT image reconstructions of the cervical spine were also generated. COMPARISON:  CT head 06/04/2016 FINDINGS: CT HEAD FINDINGS Brain: Generalized atrophy. Normal ventricular morphology. No midline shift or mass effect. Minimal small vessel chronic ischemic changes of deep cerebral white matter. No intracranial hemorrhage, mass lesion or evidence of acute infarction. No extra-axial fluid collections. Vascular: Mild atherosclerotic calcification within internal carotid arteries   at skull base Skull: Intact Sinuses/Orbits: Partial opacification of ethmoid air cells and frontal sinus. Small air-fluid levels in maxillary sinuses bilaterally. Other: N/A CT CERVICAL SPINE FINDINGS Alignment: Mild anterolisthesis at C4-C5 with minimal retrolisthesis at C5-C6 and C6-C7 Skull base and vertebrae: Diffuse osseous demineralization. Visualized skull base intact. Multilevel advanced facet degenerative changes. Advanced degenerative disc disease changes at C5-C6 and C6-C7 with disc space narrowing and endplate spur formation. Vertebral body heights maintained without fracture or bone destruction. Soft tissues and spinal canal: Prevertebral soft tissues normal thickness. Disc levels:  Mild pseudo disc at C4-C5 Upper chest: Emphysematous changes at lung  apices Other: N/A IMPRESSION: Atrophy with minimal small vessel chronic ischemic changes of deep cerebral white matter. No acute intracranial abnormalities. Scattered sinus disease changes as above including small air-fluid levels in the maxillary sinuses. Degenerative disc and facet disease changes of the cervical spine. No acute cervical spine abnormalities. Electronically Signed   By: Mark  Boles M.D.   On: 12/06/2017 12:35    ROSnegative except above Blood pressure 90/65, pulse 95, temperature 99.1 F (37.3 C), temperature source Oral, resp. rate (!) 27, height 5' 2" (1.575 m), weight 52.6 kg (116 lb), SpO2 100 %. Physical Exampale in good spirits no acute distress exam pertinent for her abdomen being soft nontender labs and x-ray reviewed  Assessment/Plan: Guaiac positive anemia in patient with increased INR from her Coumadin and increased BUN Plan: Will allow clear liquids if signs of active bleeding would give 2 units of FFP and agree with Protonix and if no signs of bleeding once patient stable and INR okay will proceed with an endoscopy but happy to proceed more urgently if needed and will check on tomorrow  , E 12/06/2017, 2:21 PM     

## 2017-12-06 NOTE — H&P (Signed)
History and Physical:    [name]   BTD:[id number] DOB: 1921/09/10 DOA: 12/06/2017  Referring MD/provider: Monico Blitz, PA-C PCP: Deland Pretty, MD   Patient coming from: [place] at [facility]  Chief Complaint: Black stools.  History of Present Illness:   [name] is an 84 y.o. female with a PMH of CAD, PAF on chronic Coumadin, iron deficiency anemia, hypertension, hyperlipidemia and aortic stenosis who was admitted with a chief complaint of a fall in the setting of a one-month history of black stools and progressive shortness of breath. She is under the regular care of a gastroenterologist with her last endoscopy in 2014 and her last colonoscopy in 2011. She is known to have a hiatal hernia and diverticulitis but no other significant gastrointestinal abnormalities. The patient denies associated chest pain or palpitations.  ED Course:  The patient's presenting blood pressure was low with tachypnea and mild tachycardia. Labs showed a hemoglobin of 4 (baseline hemoglobin 11.2 mg/dL 9 months ago), BUN 52, creatinine 0.84, BNP 782.1, PTT 33.1 with an INR of 3.27, troponin 0.27. Fecal occult blood testing was positive.  ROS:   Review of Systems  Constitutional: Positive for malaise/fatigue.  HENT: Negative.   Eyes: Negative.   Respiratory: Positive for shortness of breath.   Cardiovascular: Negative.   Gastrointestinal: Positive for blood in stool and melena.  Genitourinary: Negative.   Musculoskeletal: Negative.   Skin: Negative.   Neurological: Positive for weakness.  Endo/Heme/Allergies: Negative.   Psychiatric/Behavioral: Negative.     Past Medical History:   Past Medical History:  Diagnosis Date  . Abnormality of gait 09/07/2015  . Anemia, iron deficiency 09/16/2014  . Anxiety   . CAD (coronary artery disease)    a. 08/2014 NSTEMI/Cath: mild Ca2+ or RCA ostium, otw nl cors. CO 3.3 L/min (thermo), 3.7 L/min ([place]).  .  Carotid arterial disease ([place]) 07/30/2012   carotid doppler; normal study  . Chronic anticoagulation, with coumadin, with PAF and CHADS2Vasc2 score of 4 09/16/2014  . Degenerative arthritis   . Diverticulosis   . Essential hypertension   . Foot fracture, left   . GERD (gastroesophageal reflux disease)   . History of nuclear stress test 09/19/2007   normal pattern of perfusion; post-stress EF 86%; EKG negative for ischemia; low risk scan  . Hyperlipidemia   . Left carotid bruit    a. 07/2015 Carotid U/S: 1-39% bilat ICA stenosis.  . Memory disorder 03/05/2014  . Mild renal insufficiency   . Mitral prolapse   . Neuropathy   . PAF (paroxysmal atrial fibrillation) (HCC)    a. 08/2014-->Coumadin (CHA2DS2VASc = 4).  . Peripheral neuropathy    Small fiber   . Pre-syncope    a. 04/2015 in setting of bradycardia-->CCB/BB doses adjusted.  . Pulmonary hypertension ([place])   . Valvular heart disease    a. 04/2015 Echo: EF 65-70%, no rwma, Gr1 DD, mild AS, triv AI, mild MS, mod MR, mod dil LA, mod TR, sev increased PASP.    Past Surgical History:   Past Surgical History:  Procedure Laterality Date  .  Bilateral bunionectomies    . ABDOMINAL HYSTERECTOMY    . APPENDECTOMY    . DILATION AND CURETTAGE OF UTERUS    . FOOT FRACTURE SURGERY Left   . gallbladder resection    . LEFT HEART CATHETERIZATION WITH CORONARY ANGIOGRAM N/A 09/14/2014   Procedure: LEFT HEART CATHETERIZATION WITH CORONARY ANGIOGRAM;  Surgeon: Troy Sine, MD;  Location: [facility] CATH  LAB;  Service: Cardiovascular;  Laterality: N/A;  . REPLACEMENT TOTAL KNEE Right   . resuspension procedure    . ROBOTIC ASSISTED BILATERAL SALPINGO OOPHERECTOMY Bilateral 02/19/2017   Procedure: XI ROBOTIC ASSISTED BILATERAL SALPINGO OOPHORECTOMY AND LYSIS OF ADHESION;  Surgeon: Everitt Amber, MD;  Location: [facility] ORS;  Service: Gynecology;  Laterality: Bilateral;  . TEAR DUCT PROBING WITH STRABISMUS REPAIR     Tear duct repair surgery  . TONSILLECTOMY      . UMBILICAL HERNIA REPAIR      Social History:   Social History   Socioeconomic History  . Marital status: Widowed    Spouse name: Not on file  . Number of children: 2  . Years of education: AS  . Highest education level: Not on file  Occupational History  . Occupation: Retired  Scientific laboratory technician  . Financial resource strain: Not on file  . Food insecurity:    Worry: Not on file    Inability: Not on file  . Transportation needs:    Medical: Not on file    Non-medical: Not on file  Tobacco Use  . Smoking status: Former Smoker    Types: Cigarettes  . Smokeless tobacco: Never Used  . Tobacco comment: quit many mnay years ago   Substance and Sexual Activity  . Alcohol use: No  . Drug use: No  . Sexual activity: Never  Lifestyle  . Physical activity:    Days per week: Not on file    Minutes per session: Not on file  . Stress: Not on file  Relationships  . Social connections:    Talks on phone: Not on file    Gets together: Not on file    Attends religious service: Not on file    Active member of club or organization: Not on file    Attends meetings of clubs or organizations: Not on file    Relationship status: Not on file  . Intimate partner violence:    Fear of current or ex partner: Not on file    Emotionally abused: Not on file    Physically abused: Not on file    Forced sexual activity: Not on file  Other Topics Concern  . Not on file  Social History Narrative   Resides at [facility]   Patient is left handed, but uses right handed.   Patient drinks one cup caffeine daily.    Allergies   Avelox [moxifloxacin hcl in nacl]; Moxifloxacin; Aricept [donepezil hcl]; Codeine; Donepezil; Garlic; Latex; Onion; Sulfa drugs cross reactors; Other; Duloxetine; and Polysporin [bacitracin-polymyxin b]  Family history:   Family History  Problem Relation Age of Onset  . Pneumonia Mother   . Coronary artery disease Mother   . Hypertension Mother   . Heart disease  Father   . Cancer Father   . Hypertension Father   . Arthritis Sister     Current Medications:   Prior to Admission medications   Medication Sig Start Date End Date Taking? Authorizing Provider  acetaminophen (TYLENOL) 500 MG tablet Take 500 mg by mouth every 6 (six) hours as needed for moderate pain.    [provider]  atorvastatin (LIPITOR) 20 MG tablet Take 0.5 tablets (10 mg total) by mouth at bedtime. 06/06/17   Troy Sine, MD  Calcium Carb-Cholecalciferol (CALCIUM 600+D) 600-800 MG-UNIT TABS Take 1 tablet by mouth daily.    [provider]  carvedilol (COREG) 3.125 MG tablet TAKE 1 TABLET (3.125 MG TOTAL) BY MOUTH 2 (TWO)  TIMES DAILY. 06/20/17   Troy Sine, MD  cetirizine (ZYRTEC) 10 MG tablet Take 10 mg by mouth daily with breakfast.     [provider]  cholecalciferol (VITAMIN D) 1000 UNITS tablet Take 1,000 Units by mouth daily.      [provider]  colestipol (COLESTID) 1 G tablet Take 1 g by mouth 3 (three) times daily. For diarrhea and does not take at the same time as coumadin    [provider]  cycloSPORINE (RESTASIS) 0.05 % ophthalmic emulsion Place 1 drop into both eyes 2 (two) times daily.     [provider]  diltiazem (CARDIZEM CD) 120 MG 24 hr capsule Take 1 capsule (120 mg total) by mouth daily. 08/29/17 11/27/17  Troy Sine, MD  diltiazem (CARDIZEM SR) 60 MG 12 hr capsule TAKE 1 CAPSULE BY MOUTH EVERY 12 HOURS 11/21/17   Leonie Man, MD  Emollient [facility] AD[facility]ANCED CARE EX) Apply 1 application topically 2 (two) times daily.    [provider]  Flaxseed, Linseed, (FLAX SEED OIL) 1000 MG CAPS Take 1 capsule by mouth 3 (three) times daily.    [provider]  fluticasone (FLONASE) 50 MCG/ACT nasal spray Place 2 sprays into both nostrils 2 (two) times daily.    [provider]  gabapentin (NEURONTIN) 300 MG capsule TAKE 1 CAPSULE THREE TIMES DAILY AND 3 CAPSULES AT  BEDTIME Patient taking differently: Take 300 mg in the morning and at lunch, then take 900 mg at bedtime 11/03/15   Dohmeier, Asencion Partridge, MD  hydrALAZINE (APRESOLINE) 25 MG tablet Take 1 tablet (25 mg total) by mouth 3 (three) times daily. 10/21/17   Lendon Colonel, NP  hydrochlorothiazide (HYDRODIURIL) 25 MG tablet Take 1 tablet (25 mg total) daily by mouth. 07/02/17   Troy Sine, MD  LACTOBACILLUS RHAMNOSUS, GG, PO Take 1 capsule by mouth daily.    [provider]  levETIRAcetam (KEPPRA) 250 MG tablet One tablet in the morning and 2 in the evening 10/09/17   Kathrynn Ducking, MD  loperamide (IMODIUM A-D) 2 MG tablet Take 2 mg by mouth daily with breakfast.    [provider]  memantine (NAMENDA) 10 MG tablet TAKE 1 TABLET TWICE DAILY 10/30/17   Kathrynn Ducking, MD  mirtazapine (REMERON) 7.5 MG tablet  08/07/17   [provider]  Multiple Vitamin (MULTIVITAMIN) tablet Take 1 tablet by mouth daily.      [provider]  pantoprazole (PROTONIX) 40 MG tablet Take 40 mg by mouth daily.  11/15/15   [provider]  PEPPERMINT OIL PO Take 1 Dose by mouth 2 (two) times daily. 1 dose in the morning and at bedtime    [provider]  pyridOXINE (VITAMIN B-6) 100 MG tablet Take 100 mg by mouth daily.    [provider]  ranitidine (ZANTAC) 150 MG tablet Take 150 mg by mouth 2 (two) times daily.  07/26/16   [provider]  simethicone (MYLICON) 081 MG chewable tablet Chew 125 mg by mouth as needed for flatulence.    [provider]  spironolactone (ALDACTONE) 25 MG tablet Take 0.5 tablets (12.5 mg total) by mouth daily. 11/08/17 02/06/18  Troy Sine, MD  vitamin B-12 (CYANOCOBALAMIN) 1000 MCG tablet Take 1,000 mcg by mouth daily.    [provider]  warfarin (COUMADIN) 2 MG tablet TAKE 1 AND 1/2 TO 2 TABLETS DAILY AS DIRECTED BY COUMADIN CLINIC 07/16/17   Croitoru, Dani Gobble, MD  Physical Exam:   Vitals:    12/06/17 1200 12/06/17 1346 12/06/17 1400 12/06/17 1400  BP: 94/67 93/79 90/65    Pulse: 89 89 95   Resp: (!) 22 16 (!) 27   Temp:  98.4 F (36.9 C)  99.1 F (37.3 C)  TempSrc:    Oral  SpO2: 100%  100%   Weight:      Height:         Physical Exam: Blood pressure 90/65, pulse 95, temperature 99.1 F (37.3 C), temperature source Oral, resp. rate (!) 27, height 5\' 2"  (1.575 m), weight 52.6 kg (116 lb), SpO2 100 %. Gen: No acute distress. Head: Normocephalic, atraumatic. Eyes: Pupils equal, round and reactive to light. Extraocular movements intact.  Sclerae nonicteric. No lid lag. Mouth: Oropharynx reveals dry mucous membranes. Dentition is fair. Neck: Supple, no thyromegaly, no lymphadenopathy, no jugular venous distention. Chest: Lungs are clear to auscultation with good air movement. No rales, rhonchi or wheezes.  CV: Heart sounds are regular with an S1, S2. Harsh grade 3/6 systolic murmur consistent with aortic stenosis. Abdomen: Soft, nontender, nondistended with normal active bowel sounds. No hepatosplenomegaly or palpable masses. Extremities: Extremities are without clubbing, or cyanosis. 1+ edema. Pedal pulses 2+.  Skin: Pale.Warm and dry. No rashes, lesions or wounds. Neuro: Alert and oriented times 3; grossly nonfocal.  Psych: Insight is good and judgment is appropriate. Mood and affect normal.   Data Review:    Labs: Basic Metabolic Panel: Recent Labs  Lab 12/06/17 1052  NA 139  K 3.8  CL 106  CO2 23  GLUCOSE 116*  BUN 52*  CREATININE 0.84  CALCIUM 8.9   Liver Function Tests: Recent Labs  Lab 12/06/17 1052  AST 32  ALT 15  ALKPHOS 40  BILITOT 0.6  PROT 6.1*  ALBUMIN 3.2*   CBC: Recent Labs  Lab 12/06/17 1052  WBC 10.8*  NEUTROABS 9.0*  HGB 4.0*  HCT 13.8*  MCV 79.8  PLT 258   Cardiac Enzymes: Recent Labs  Lab 12/06/17 1052 12/06/17 1158  CKTOTAL 113  --   TROPONINI  --  0.24*    BNP  782.1  Urinalysis    Component Value  Date/Time   COLORURINE YELLOW 02/13/2017 [place] 02/13/2017 1229   LABSPEC 1.013 02/13/2017 1229   PHURINE 7.0 02/13/2017 1229   GLUCOSEU NEGATIVE 02/13/2017 1229   HGBUR NEGATIVE 02/13/2017 1229   BILIRUBINUR NEGATIVE 02/13/2017 1229   KETONESUR NEGATIVE 02/13/2017 1229   PROTEINUR NEGATIVE 02/13/2017 1229   UROBILINOGEN 0.2 05/09/2015 1038   NITRITE NEGATIVE 02/13/2017 1229   LEUKOCYTESUR NEGATIVE 02/13/2017 1229      Radiographic Studies: Dg Chest 2 View  Result Date: 12/06/2017 CLINICAL DATA:  Shortness of breath EXAM: CHEST - 2 VIEW COMPARISON:  August 02, 2016 FINDINGS: There is no edema or consolidation. Heart is borderline enlarged with pulmonary vascularity within normal limits. There is aortic atherosclerosis. No adenopathy. No evident bone lesions. IMPRESSION: Aortic atherosclerosis. Heart borderline enlarged. No edema or consolidation. Electronically Signed   By: Lowella Grip III M.D.   On: 12/06/2017 11:06   Ct Head Wo Contrast  Result Date: 12/06/2017 CLINICAL DATA:  [place] last night at assisted living facility, no loss of consciousness; having chest pain EXAM: CT HEAD WITHOUT CONTRAST CT CERVICAL SPINE WITHOUT CONTRAST TECHNIQUE: Multidetector CT imaging of the head and cervical spine was performed following the standard protocol without intravenous contrast. Multiplanar CT image reconstructions of the cervical spine were also generated.  COMPARISON:  CT head 06/04/2016 FINDINGS: CT HEAD FINDINGS Brain: Generalized atrophy. Normal ventricular morphology. No midline shift or mass effect. Minimal small vessel chronic ischemic changes of deep cerebral white matter. No intracranial hemorrhage, mass lesion or evidence of acute infarction. No extra-axial fluid collections. Vascular: Mild atherosclerotic calcification within internal carotid arteries at skull base Skull: Intact Sinuses/Orbits: Partial opacification of ethmoid air cells and frontal sinus. Small  air-fluid levels in maxillary sinuses bilaterally. Other: N/A CT CERVICAL SPINE FINDINGS Alignment: Mild anterolisthesis at C4-C5 with minimal retrolisthesis at C5-C6 and C6-C7 Skull base and vertebrae: Diffuse osseous demineralization. Visualized skull base intact. Multilevel advanced facet degenerative changes. Advanced degenerative disc disease changes at C5-C6 and C6-C7 with disc space narrowing and endplate spur formation. Vertebral body heights maintained without fracture or bone destruction. Soft tissues and spinal canal: Prevertebral soft tissues normal thickness. Disc levels:  Mild pseudo disc at C4-C5 Upper chest: Emphysematous changes at lung apices Other: N/A IMPRESSION: Atrophy with minimal small vessel chronic ischemic changes of deep cerebral white matter. No acute intracranial abnormalities. Scattered sinus disease changes as above including small air-fluid levels in the maxillary sinuses. Degenerative disc and facet disease changes of the cervical spine. No acute cervical spine abnormalities. Electronically Signed   By: Lavonia Dana M.D.   On: 12/06/2017 12:35   Ct Cervical Spine Wo Contrast  Result Date: 12/06/2017 CLINICAL DATA:  [place] last night at assisted living facility, no loss of consciousness; having chest pain EXAM: CT HEAD WITHOUT CONTRAST CT CERVICAL SPINE WITHOUT CONTRAST TECHNIQUE: Multidetector CT imaging of the head and cervical spine was performed following the standard protocol without intravenous contrast. Multiplanar CT image reconstructions of the cervical spine were also generated. COMPARISON:  CT head 06/04/2016 FINDINGS: CT HEAD FINDINGS Brain: Generalized atrophy. Normal ventricular morphology. No midline shift or mass effect. Minimal small vessel chronic ischemic changes of deep cerebral white matter. No intracranial hemorrhage, mass lesion or evidence of acute infarction. No extra-axial fluid collections. Vascular: Mild atherosclerotic calcification within internal  carotid arteries at skull base Skull: Intact Sinuses/Orbits: Partial opacification of ethmoid air cells and frontal sinus. Small air-fluid levels in maxillary sinuses bilaterally. Other: N/A CT CERVICAL SPINE FINDINGS Alignment: Mild anterolisthesis at C4-C5 with minimal retrolisthesis at C5-C6 and C6-C7 Skull base and vertebrae: Diffuse osseous demineralization. Visualized skull base intact. Multilevel advanced facet degenerative changes. Advanced degenerative disc disease changes at C5-C6 and C6-C7 with disc space narrowing and endplate spur formation. Vertebral body heights maintained without fracture or bone destruction. Soft tissues and spinal canal: Prevertebral soft tissues normal thickness. Disc levels:  Mild pseudo disc at C4-C5 Upper chest: Emphysematous changes at lung apices Other: N/A IMPRESSION: Atrophy with minimal small vessel chronic ischemic changes of deep cerebral white matter. No acute intracranial abnormalities. Scattered sinus disease changes as above including small air-fluid levels in the maxillary sinuses. Degenerative disc and facet disease changes of the cervical spine. No acute cervical spine abnormalities. Electronically Signed   By: Lavonia Dana M.D.   On: 12/06/2017 12:35    EKG: Independently reviewed. Sinus rhythm at 94 bpm with PVCs, poor R-wave progression, and ST depressions in the anterolateral leads.   Assessment/Plan:   Principal Problem:   Severe iron deficiency anemia secondary to chronic GI bleeding/dyspnea/elevated troponin Hemoglobin 4 on admission and her anemia is symptomatic with shortness of breath.Given elevated INR, will give 2 mg of vitamin K. 2 units of PRBCs has been ordered. She will need to be monitored very closely for volume overload  given her valvular disease and high risk for pulmonary edema with this level of volume.GI has been consulted. She'll be placed on PPI therapy I'll rest with clear liquid diet,with plans for upper endoscopy per  GI.  Active Problems:   Hyperlipidemia Continue statin.    Neuropathy Continue Neurontin.    Moderate to severe pulmonary hypertension (HCC) Monitor closely for respiratory symptoms.    Valvular heart disease Again,we'll need to be very mindful of the risk for pulmonary edema with administration of blood products.    CAD/NSTEMI (non-ST elevated myocardial infarction) (HCC) Troponin elevation consistent with demand ischemia in the setting of profound anemia. BNP mildly elevated but chest x-ray personally reviewed and does not reveal any pleural effusions or evidence of pulmonary edema.Continue statin.     PAF (paroxysmal atrial fibrillation) (HCC)/Chronic anticoagulation, with coumadin, with PAF and CHADS2Vasc2 score of 4 Hold Coumadin. 2 mg of vitamin K.Continue Cardizem for rate control.   Other information:   DVT prophylaxis: SCDs ordered. Code Status: DO NOT RESUSCITATE. Family Communication: No family currently at bedside.  Disposition Plan: Home when hemoglobin stable and diagnostic workup completed, likely greater than 48 hours. Consults called: Gastroenterology, Dr. Watt Climes. Admission status: Inpatient: The patient has severe symptomatic anemia, evidence of GI bleeding, elevated troponin/cardiac ischemia and will need to be further stabilized, which is anticipated to be greater than 2 midnights, before it is safe for her to be discharged.   Margreta Journey Rama [facility] Pager [phone] Cell: [phone]   If 7PM-7AM, please contact night-coverage [email or web address] Password [facility] 12/06/2017, 2:46 PM

## 2017-12-07 ENCOUNTER — Inpatient Hospital Stay (HOSPITAL_COMMUNITY): Payer: Medicare Other

## 2017-12-07 LAB — HEMOGLOBIN AND HEMATOCRIT, BLOOD
HCT: 21.6 % — ABNORMAL LOW (ref 36.0–46.0)
HCT: 22.4 % — ABNORMAL LOW (ref 36.0–46.0)
HCT: 30.4 % — ABNORMAL LOW (ref 36.0–46.0)
Hemoglobin: 7 g/dL — ABNORMAL LOW (ref 12.0–15.0)
Hemoglobin: 7.3 g/dL — ABNORMAL LOW (ref 12.0–15.0)
Hemoglobin: 10 g/dL — ABNORMAL LOW (ref 12.0–15.0)

## 2017-12-07 LAB — BASIC METABOLIC PANEL
Anion gap: 10 (ref 5–15)
BUN: 36 mg/dL — ABNORMAL HIGH (ref 6–20)
CO2: 24 mmol/L (ref 22–32)
Calcium: 8.5 mg/dL — ABNORMAL LOW (ref 8.9–10.3)
Chloride: 107 mmol/L (ref 101–111)
Creatinine, Ser: 0.68 mg/dL (ref 0.44–1.00)
GFR calc Af Amer: >60 mL/min (ref >60)
GFR calc non Af Amer: >60 mL/min (ref >60)
Glucose, Bld: 87 mg/dL (ref 65–99)
Potassium: 3.3 mmol/L — ABNORMAL LOW (ref 3.5–5.1)
Sodium: 141 mmol/L (ref 135–145)

## 2017-12-07 LAB — BRAIN NATRIURETIC PEPTIDE: B Natriuretic Peptide: 953 pg/mL — ABNORMAL HIGH (ref 0.0–100.0)

## 2017-12-07 LAB — PREPARE RBC (CROSSMATCH)

## 2017-12-07 MED ORDER — FUROSEMIDE 10 MG/ML IJ SOLN: 20.0000 mg | Freq: Every day | INTRAMUSCULAR | Status: DC

## 2017-12-07 MED ORDER — FUROSEMIDE 10 MG/ML IJ SOLN
20.0000 mg | Freq: Every day | INTRAMUSCULAR | Status: DC
  Administered 2017-12-08 – 2017-12-09 (×2): 20 mg via INTRAVENOUS
  Filled 2017-12-07 (×2): qty 2

## 2017-12-07 MED ORDER — PANTOPRAZOLE SODIUM 40 MG PO TBEC
40.0000 mg | DELAYED_RELEASE_TABLET | Freq: Every day | ORAL | Status: DC
  Administered 2017-12-07 – 2017-12-17 (×11): 40 mg via ORAL
  Filled 2017-12-07 (×4): qty 1
  Filled 2017-12-07: qty 2
  Filled 2017-12-07 (×6): qty 1

## 2017-12-07 MED ORDER — SODIUM CHLORIDE 0.9 % IV SOLN
Freq: Once | INTRAVENOUS | Status: AC
  Administered 2017-12-07: 12:00:00 via INTRAVENOUS

## 2017-12-07 MED ORDER — FUROSEMIDE 10 MG/ML IJ SOLN: 40.0000 mg | Freq: Once | INTRAMUSCULAR | Status: DC

## 2017-12-07 MED ORDER — FUROSEMIDE 10 MG/ML IJ SOLN
20.0000 mg | Freq: Once | INTRAMUSCULAR | Status: AC
  Administered 2017-12-07: 20 mg via INTRAVENOUS
  Filled 2017-12-07: qty 2

## 2017-12-07 MED ORDER — POTASSIUM CHLORIDE CRYS ER 20 MEQ PO TBCR
40.0000 meq | EXTENDED_RELEASE_TABLET | Freq: Once | ORAL | Status: AC
  Administered 2017-12-07: 40 meq via ORAL
  Filled 2017-12-07: qty 2

## 2017-12-07 NOTE — Progress Notes (Signed)
[name] 10:11 AM  Subjective: Patient in her usual good mood and only complains of some shortness of breath and moved her bowels yesterday without any obvious signs of bleeding and case discussed with the nurse as well and she has no new complaints  Objective: Vital signs stable afebrile no acute distress abdomen is soft nontender hemoglobin increased to 7 BUN and creatinine decreased to recheck of INR  Assessment: GI blood loss in patient with over anticoagulation  Plan: Soft solid diet transfuse 1 more unit repeat labs tomorrow including INR consider endoscopy Monday just to be sure however if signs of active bleeding 2 units of FFP and call me if any further question or problem this weekend  [facility]  Pager [phone] After 5PM or if no answer call [phone]

## 2017-12-07 NOTE — Progress Notes (Addendum)
Patient having some shortness of breath at rest in bed.  Oxygen increased to 3 and oxygen saturation 98% on 3L.  BP 100/55. HR 74.  Crackles heard bilaterally in lower lung fields.  Dr. Vista Lawman made aware that patient is receiving her third unit of blood and saw patient at bedside.  Ordered BNP and portable chest xray routine. Blood bank made aware as well.  Will continue to monitor.

## 2017-12-07 NOTE — Progress Notes (Signed)
[facility]                                                                              Patient Demographics  [name], is a 84 y.o. female, DOB - September 21, 1921, JSE:[id number]  Admit date - 12/06/2017   Admitting Physician Venetia Maxon Rama, MD  Outpatient Primary MD for the patient is Deland Pretty, MD  Outpatient specialists:   LOS - 1  days    No chief complaint on file.      Brief summary  [name] is an 84 y.o. female with a PMH of CAD, PAF on chronic Coumadin, iron deficiency anemia, hypertension, hyperlipidemia and aortic stenosis presenting with 1 month history of black stools and progressively worsening shortness of breath, and noted to have hemoglobin of 4 with positive Hemoccult and INR of 3.27.  Patient admitted for GI bleed    Assessment & Plan    Principal Problem:   Severe anemia Active Problems:   Moderate to severe pulmonary hypertension (HCC)   Dyspnea on exertion   Valvular heart disease   NSTEMI (non-ST elevated myocardial infarction) (HCC)   PAF (paroxysmal atrial fibrillation) (HCC)   Chronic anticoagulation, with coumadin, with PAF and CHADS2Vasc2 score of 4   Anemia, iron deficiency   Long term current use of anticoagulant therapy   CAD (coronary artery disease)   Chronic GI bleeding   #1 severe symptomatic aortic deficiency anemia due to chronic GI bleeding: Hemoglobin of 4 on admission with elevated INR Given vitamin K Coumadin on hold Attentive PRBC transfusion with close monitoring for pulmonary edema -may need diuretic Monitor INR Serial H&H PPI Bowel rest GI consulted-clear liquids, PPI with plans for endoscopy if INR okay without signs of bleeding, otherwise exacerbating with continuous FFP proceed to endoscopy   #2 Paroxysmal Atrial Fibrillation, on chronic anticoagulation: INR 3.27 on admission CHADS2Vasc2 score of 4 Hold Coumadin. 2 mg of vitamin K Continue Cardizem for rate control     #3 CAD/NSTEMI: Troponin elevation consistent with demand ischemia in the setting of profound anemia. B NP mildly elevated but chest x-ray negative for edema. Continue statin.   #4 Moderate to Severe Pulmonary Hypertension/ AS: Monitor closely for respiratory symptoms Outpatient follow-up on discharge          Code Status: DNR DVT Prophylaxis:  SCD's Family Communication: Discussed in detail with the patient, all imaging results, lab results explained to the patient    Disposition Plan: Home when hemoglobin stable and diagnostic workup completed  Time Spent in minutes 35 minutes  Procedures:    Consultants:   Gastroenterology, Dr. Watt Climes.    Antimicrobials:      Medications  Scheduled Meds: . atorvastatin  10 mg Oral QHS  . calcium-vitamin D  1 tablet Oral Daily  . carvedilol  3.125 mg Oral BID  . cholecalciferol  1,000 Units Oral Daily  . colestipol  1 g Oral TID  . cycloSPORINE  1 drop Both Eyes BID  . diltiazem  120 mg Oral Daily  . feeding supplement  1 Container Oral TID BM  . fluticasone  2 spray Each Nare BID  .  gabapentin  300 mg Oral BID  . gabapentin  900 mg Oral QHS  . loratadine  10 mg Oral Daily  . phytonadione  2 mg Oral Once  . sodium chloride flush  3 mL Intravenous Q12H   Continuous Infusions: . pantoprozole (PROTONIX) infusion 8 mg/hr (12/07/17 0019)   PRN Meds:.acetaminophen, ondansetron **OR** ondansetron (ZOFRAN) IV   Antibiotics   Anti-infectives (From admission, onward)   None        Subjective:   [name] was seen and examined today. Patient have physical complaints of shortness of breath without any nausea vomiting, no chest pain  Objective:   Vitals:   12/06/17 2045 12/06/17 2100 12/06/17 2337 12/07/17 0652  BP: (!) 131/42 (!) 126/49 (!) 126/57 (!) 94/46  Pulse: 81 91 78 82  Resp: (!) 22 20    Temp: 98.7 F (37.1 C) 98.5 F (36.9 C) 98.7 F (37.1 C) 99.6 F (37.6 C)  TempSrc: Oral Oral Oral  Oral  SpO2: 100% 100% 96% 98%  Weight:      Height:        Intake/Output Summary (Last 24 hours) at 12/07/2017 0946 Last data filed at 12/07/2017 0900 Gross per 24 hour  Intake 2335.42 ml  Output 325 ml  Net 2010.42 ml     Wt Readings from Last 3 Encounters:  12/06/17 50.4 kg (111 lb 1.6 oz)  11/08/17 52.9 kg (116 lb 9.6 oz)  10/21/17 52.2 kg (115 lb)     Exam  General: NAD  HEENT: NCAT,  PERRL,MMM  Neck: SUPPLE, (-) JVD  Cardiovascular: RRR, (-) GALLOP, (-) MURMUR  Respiratory: CTA  Gastrointestinal: SOFT, (-) DISTENSION, BS(+), (_) TENDERNESS  Ext: (-) CYANOSIS, (-) EDEMA  Neuro: A, OX 3  Skin:(-) RASH  Psych:NORMAL AFFECT/MOOD   Data Reviewed:  I have personally reviewed following labs and imaging studies  Micro Results No results found for this or any previous visit (from the past 240 hour(s)).  Radiology Reports Dg Chest 2 View  Result Date: 12/06/2017 CLINICAL DATA:  Shortness of breath EXAM: CHEST - 2 VIEW COMPARISON:  August 02, 2016 FINDINGS: There is no edema or consolidation. Heart is borderline enlarged with pulmonary vascularity within normal limits. There is aortic atherosclerosis. No adenopathy. No evident bone lesions. IMPRESSION: Aortic atherosclerosis. Heart borderline enlarged. No edema or consolidation. Electronically Signed   By: Lowella Grip III M.D.   On: 12/06/2017 11:06   Ct Head Wo Contrast  Result Date: 12/06/2017 CLINICAL DATA:  [place] last night at assisted living facility, no loss of consciousness; having chest pain EXAM: CT HEAD WITHOUT CONTRAST CT CERVICAL SPINE WITHOUT CONTRAST TECHNIQUE: Multidetector CT imaging of the head and cervical spine was performed following the standard protocol without intravenous contrast. Multiplanar CT image reconstructions of the cervical spine were also generated. COMPARISON:  CT head 06/04/2016 FINDINGS: CT HEAD FINDINGS Brain: Generalized atrophy. Normal ventricular morphology. No midline  shift or mass effect. Minimal small vessel chronic ischemic changes of deep cerebral white matter. No intracranial hemorrhage, mass lesion or evidence of acute infarction. No extra-axial fluid collections. Vascular: Mild atherosclerotic calcification within internal carotid arteries at skull base Skull: Intact Sinuses/Orbits: Partial opacification of ethmoid air cells and frontal sinus. Small air-fluid levels in maxillary sinuses bilaterally. Other: N/A CT CERVICAL SPINE FINDINGS Alignment: Mild anterolisthesis at C4-C5 with minimal retrolisthesis at C5-C6 and C6-C7 Skull base and vertebrae: Diffuse osseous demineralization. Visualized skull base intact. Multilevel advanced facet degenerative changes. Advanced degenerative disc disease changes at C5-C6 and  C6-C7 with disc space narrowing and endplate spur formation. Vertebral body heights maintained without fracture or bone destruction. Soft tissues and spinal canal: Prevertebral soft tissues normal thickness. Disc levels:  Mild pseudo disc at C4-C5 Upper chest: Emphysematous changes at lung apices Other: N/A IMPRESSION: Atrophy with minimal small vessel chronic ischemic changes of deep cerebral white matter. No acute intracranial abnormalities. Scattered sinus disease changes as above including small air-fluid levels in the maxillary sinuses. Degenerative disc and facet disease changes of the cervical spine. No acute cervical spine abnormalities. Electronically Signed   By: Lavonia Dana M.D.   On: 12/06/2017 12:35   Ct Cervical Spine Wo Contrast  Result Date: 12/06/2017 CLINICAL DATA:  [place] last night at assisted living facility, no loss of consciousness; having chest pain EXAM: CT HEAD WITHOUT CONTRAST CT CERVICAL SPINE WITHOUT CONTRAST TECHNIQUE: Multidetector CT imaging of the head and cervical spine was performed following the standard protocol without intravenous contrast. Multiplanar CT image reconstructions of the cervical spine were also generated.  COMPARISON:  CT head 06/04/2016 FINDINGS: CT HEAD FINDINGS Brain: Generalized atrophy. Normal ventricular morphology. No midline shift or mass effect. Minimal small vessel chronic ischemic changes of deep cerebral white matter. No intracranial hemorrhage, mass lesion or evidence of acute infarction. No extra-axial fluid collections. Vascular: Mild atherosclerotic calcification within internal carotid arteries at skull base Skull: Intact Sinuses/Orbits: Partial opacification of ethmoid air cells and frontal sinus. Small air-fluid levels in maxillary sinuses bilaterally. Other: N/A CT CERVICAL SPINE FINDINGS Alignment: Mild anterolisthesis at C4-C5 with minimal retrolisthesis at C5-C6 and C6-C7 Skull base and vertebrae: Diffuse osseous demineralization. Visualized skull base intact. Multilevel advanced facet degenerative changes. Advanced degenerative disc disease changes at C5-C6 and C6-C7 with disc space narrowing and endplate spur formation. Vertebral body heights maintained without fracture or bone destruction. Soft tissues and spinal canal: Prevertebral soft tissues normal thickness. Disc levels:  Mild pseudo disc at C4-C5 Upper chest: Emphysematous changes at lung apices Other: N/A IMPRESSION: Atrophy with minimal small vessel chronic ischemic changes of deep cerebral white matter. No acute intracranial abnormalities. Scattered sinus disease changes as above including small air-fluid levels in the maxillary sinuses. Degenerative disc and facet disease changes of the cervical spine. No acute cervical spine abnormalities. Electronically Signed   By: Lavonia Dana M.D.   On: 12/06/2017 12:35    Lab Data:  CBC: Recent Labs  Lab 12/06/17 1052 12/07/17 0217  WBC 10.8*  --   NEUTROABS 9.0*  --   HGB 4.0* 7.0*  HCT 13.8* 21.6*  MCV 79.8  --   PLT 258  --    Basic Metabolic Panel: Recent Labs  Lab 12/06/17 1052 12/07/17 0217  NA 139 141  K 3.8 3.3*  CL 106 107  CO2 23 24  GLUCOSE 116* 87  BUN 52*  36*  CREATININE 0.84 0.68  CALCIUM 8.9 8.5*   GFR: Estimated Creatinine Clearance: 35 mL/min (by C-G formula based on SCr of 0.68 mg/dL). Liver Function Tests: Recent Labs  Lab 12/06/17 1052  AST 32  ALT 15  ALKPHOS 40  BILITOT 0.6  PROT 6.1*  ALBUMIN 3.2*   No results for input(s): LIPASE, AMYLASE in the last 168 hours. No results for input(s): AMMONIA in the last 168 hours. Coagulation Profile: Recent Labs  Lab 12/06/17 1052  INR 3.27   Cardiac Enzymes: Recent Labs  Lab 12/06/17 1052 12/06/17 1158  CKTOTAL 113  --   TROPONINI  --  0.24*   BNP (last 3 results)  No results for input(s): PROBNP in the last 8760 hours. HbA1C: No results for input(s): HGBA1C in the last 72 hours. CBG: No results for input(s): GLUCAP in the last 168 hours. Lipid Profile: No results for input(s): CHOL, HDL, LDLCALC, TRIG, CHOLHDL, LDLDIRECT in the last 72 hours. Thyroid Function Tests: No results for input(s): TSH, T4TOTAL, FREET4, T3FREE, THYROIDAB in the last 72 hours. Anemia Panel: No results for input(s): VITAMINB12, FOLATE, FERRITIN, TIBC, IRON, RETICCTPCT in the last 72 hours. Urine analysis:    Component Value Date/Time   COLORURINE YELLOW 02/13/2017 1229   APPEARANCEUR CLEAR 02/13/2017 1229   LABSPEC 1.013 02/13/2017 1229   PHURINE 7.0 02/13/2017 1229   GLUCOSEU NEGATIVE 02/13/2017 1229   HGBUR NEGATIVE 02/13/2017 1229   BILIRUBINUR NEGATIVE 02/13/2017 1229   KETONESUR NEGATIVE 02/13/2017 1229   PROTEINUR NEGATIVE 02/13/2017 1229   UROBILINOGEN 0.2 05/09/2015 1038   NITRITE NEGATIVE 02/13/2017 1229   LEUKOCYTESUR NEGATIVE 02/13/2017 1229     Benito Mccreedy M.D. [facility] 12/07/2017, 9:46 AM  Pager: [phone] Between 7am to 7pm - call Pager - [phone]  After 7pm go to [email or web address] - password [email or web address]  Call night coverage person covering after 7pm

## 2017-12-07 NOTE — Progress Notes (Signed)
Initial Nutrition Assessment  DOCUMENTATION CODES:  Not applicable  INTERVENTION:  Continue Boost Breeze po TID, each supplement provides 250 kcal and 9 grams of protein  NUTRITION DIAGNOSIS:  Increased nutrient needs related to maintenance of Lean body mass in elderly as evidenced by estimated nutritional requirements for this patient population  GOAL:  Patient will meet greater than or equal to 90% of their needs  MONITOR:  PO intake, Supplement acceptance, Diet advancement, Labs, Weight trends, I & O's  REASON FOR ASSESSMENT:  Malnutrition Screening Tool    ASSESSMENT:  84 y/o female PMHx CAD, PAF, IDA, HTN/HLD, GERD, Anxiety. Presents w/ a fall in setting of 1 month history of black stools. Workup revealed hemoglobin of 4 w/ FOBT+ and she was admitted for management/evaluation of GIB.   Patient is extremely pleasant and remarkably cognitive intact for age. She lives at an independent living facility. She reports being quite active at baseline, swimming in the salt-water pool there and singing in the choir.   She reports a chronic loss of appetite. She denies any associated GI upset n/v/c/d (does have chronic issues with bowels, though notes it has been managed well recently w/ imodium).  She does associate her loss of appetite with her anemia, saying she gets SOB with minimal exertion and has been quite fatigued, limiting her intake.   At baseline she does not drink supplements or follow any type of diet.  She reports weighing 130 lbs 6 months ago and relates this loss to her poor appetite. Per chart, her weight loss does not seem to be as significant. She does not appear to have weighed 130 lbs since 2016. She has been 115-120 lbs since early 2017. She does exhibit some wt loss, being admitted at 111 lbs. This is a loss of about 5 lbs x 31month, not clinically significant.  At this time, she is seen drinking Boost Breeze. She likes this and is agreeable to continue supplementing  her diet.   Physical Exam: Severe muscle/fat wasting, but given her relatively stable weight long term, this is felt to be much more likely due to age related sarcopenia as opposed to malnutrition. Has mild edema  Labs. HGB improved to 7.3, K: 3.3, Albumin: 3.2, WBC:10.8, BNP: 782, Glu: 116 Meds: Calcium w/ D, Resource Breeze TID, PPI,   Recent Labs  Lab 12/06/17 1052 12/07/17 0217  NA 139 141  K 3.8 3.3*  CL 106 107  CO2 23 24  BUN 52* 36*  CREATININE 0.84 0.68  CALCIUM 8.9 8.5*  GLUCOSE 116* 87   NUTRITION - FOCUSED PHYSICAL EXAM:   Most Recent Value  Orbital Region  No depletion  Upper Arm Region  Severe depletion  Thoracic and Lumbar Region  Severe depletion  Buccal Region  No depletion  Temple Region  Mild depletion  Clavicle Bone Region  Mild depletion  Clavicle and Acromion Bone Region  Severe depletion  Scapular Bone Region  Severe depletion  Dorsal Hand  Severe depletion  Patellar Region  Unable to assess  Anterior Thigh Region  Unable to assess  Posterior Calf Region  Unable to assess  Edema (RD Assessment)  Mild     Diet Order:  DIET SOFT Room service appropriate? Yes; Fluid consistency: Thin  EDUCATION NEEDS:  No education needs have been identified at this time  Skin:  Skin Assessment: Reviewed RN Assessment  Last BM:  4/19  Height:  Ht Readings from Last 1 Encounters:  12/06/17 5\' 4"  (1.626 m)  Weight:  Wt Readings from Last 1 Encounters:  12/06/17 111 lb 1.6 oz (50.4 kg)   Wt Readings from Last 10 Encounters:  12/06/17 111 lb 1.6 oz (50.4 kg)  11/08/17 116 lb 9.6 oz (52.9 kg)  10/21/17 115 lb (52.2 kg)  10/09/17 114 lb (51.7 kg)  08/08/17 113 lb 6.4 oz (51.4 kg)  06/06/17 115 lb (52.2 kg)  04/03/17 116 lb (52.6 kg)  03/11/17 116 lb 14.4 oz (53 kg)  02/19/17 116 lb 4.8 oz (52.8 kg)  02/01/17 118 lb (53.5 kg)   Ideal Body Weight:  54.54 kg  BMI:  Body mass index is 19.07 kg/m.  Estimated Nutritional Needs:  Kcal:  1250-1500 (25-30  kcal/kg bw) Protein:  65-75g pro (1.3-1.5 g/kg bw) Fluid:  >1.3 L (25 ml/kg bw)  Burtis Junes RD, LDN, CNSC Clinical Nutrition Available Tues-Sat via Pager: [phone] 12/07/2017 11:52 AM

## 2017-12-08 LAB — CBC WITH DIFFERENTIAL/PLATELET
Basophils Absolute: 0 10*3/uL (ref 0.0–0.1)
Basophils Relative: 0 %
Eosinophils Absolute: 0.2 10*3/uL (ref 0.0–0.7)
Eosinophils Relative: 2 %
HCT: 28.9 % — ABNORMAL LOW (ref 36.0–46.0)
Hemoglobin: 9.5 g/dL — ABNORMAL LOW (ref 12.0–15.0)
Lymphocytes Relative: 15 %
Lymphs Abs: 1.7 10*3/uL (ref 0.7–4.0)
MCH: 26.8 pg (ref 26.0–34.0)
MCHC: 32.9 g/dL (ref 30.0–36.0)
MCV: 81.6 fL (ref 78.0–100.0)
Monocytes Absolute: 0.8 10*3/uL (ref 0.1–1.0)
Monocytes Relative: 7 %
Neutro Abs: 8.1 10*3/uL — ABNORMAL HIGH (ref 1.7–7.7)
Neutrophils Relative %: 76 %
Platelets: 196 10*3/uL (ref 150–400)
RBC: 3.54 MIL/uL — ABNORMAL LOW (ref 3.87–5.11)
RDW: 17 % — ABNORMAL HIGH (ref 11.5–15.5)
WBC: 10.8 10*3/uL — ABNORMAL HIGH (ref 4.0–10.5)

## 2017-12-08 LAB — BRAIN NATRIURETIC PEPTIDE: B Natriuretic Peptide: 856.5 pg/mL — ABNORMAL HIGH (ref 0.0–100.0)

## 2017-12-08 LAB — BASIC METABOLIC PANEL
Anion gap: 10 (ref 5–15)
BUN: 22 mg/dL — ABNORMAL HIGH (ref 6–20)
CO2: 27 mmol/L (ref 22–32)
Calcium: 8.6 mg/dL — ABNORMAL LOW (ref 8.9–10.3)
Chloride: 102 mmol/L (ref 101–111)
Creatinine, Ser: 0.73 mg/dL (ref 0.44–1.00)
GFR calc Af Amer: >60 mL/min (ref >60)
GFR calc non Af Amer: >60 mL/min (ref >60)
Glucose, Bld: 101 mg/dL — ABNORMAL HIGH (ref 65–99)
Potassium: 4.3 mmol/L (ref 3.5–5.1)
Sodium: 139 mmol/L (ref 135–145)

## 2017-12-08 LAB — HEMOGLOBIN AND HEMATOCRIT, BLOOD
HCT: 29 % — ABNORMAL LOW (ref 36.0–46.0)
HCT: 28.7 % — ABNORMAL LOW (ref 36.0–46.0)
HCT: 29.2 % — ABNORMAL LOW (ref 36.0–46.0)
Hemoglobin: 9.6 g/dL — ABNORMAL LOW (ref 12.0–15.0)
Hemoglobin: 9.4 g/dL — ABNORMAL LOW (ref 12.0–15.0)
Hemoglobin: 9.4 g/dL — ABNORMAL LOW (ref 12.0–15.0)

## 2017-12-08 LAB — PROTIME-INR
INR: 2.38
Prothrombin Time: 25.8 seconds — ABNORMAL HIGH (ref 11.4–15.2)

## 2017-12-08 MED ORDER — METOPROLOL TARTRATE 5 MG/5ML IV SOLN
5.0000 mg | INTRAVENOUS | Status: AC | PRN
  Administered 2017-12-09 (×2): 5 mg via INTRAVENOUS
  Filled 2017-12-08 (×2): qty 5

## 2017-12-08 NOTE — Progress Notes (Signed)
[facility]                                                                              Patient Demographics  [name], is a 84 y.o. female, DOB - 02/26/1922, WEX:[id number]  Admit date - 12/06/2017   Admitting Physician Venetia Maxon Rama, MD  Outpatient Primary MD for the patient is Deland Pretty, MD  Outpatient specialists:   LOS - 2  days    No chief complaint on file.      Brief summary  [name] is an 84 y.o. female with a PMH of CAD, PAF on chronic Coumadin, iron deficiency anemia, hypertension, hyperlipidemia and aortic stenosis presenting with 1 month history of black stools and progressively worsening shortness of breath, and noted to have hemoglobin of 4 with positive Hemoccult and INR of 3.27.  Patient admitted for GI bleed    Assessment & Plan    Principal Problem:   Severe anemia Active Problems:   Moderate to severe pulmonary hypertension (HCC)   Dyspnea on exertion   Valvular heart disease   NSTEMI (non-ST elevated myocardial infarction) (HCC)   PAF (paroxysmal atrial fibrillation) (HCC)   Chronic anticoagulation, with coumadin, with PAF and CHADS2Vasc2 score of 4   Anemia, iron deficiency   Long term current use of anticoagulant therapy   CAD (coronary artery disease)   Chronic GI bleeding   #1 severe symptomatic aortic deficiency anemia due to chronic GI bleeding: Hemoglobin of 4 on admission with elevated INR Given vitamin K Coumadin on hold Attentive PRBC transfusion with close monitoring for pulmonary edema -may need diuretic Monitor INR Serial H&H PPI Bowel rest GI consulted-clear liquids, PPI with plans for endoscopy if INR okay without signs of bleeding, otherwise exacerbating with continuous FFP proceed to endoscopy  #2 Pulm Edema: BNP 953 with edema CXR yesterday after episode of sob Better with lasix - cont with monitoring of electrolytes/renal fxn Check Echo  #3 Paroxysmal Atrial Fibrillation, on  chronic anticoagulation: INR 3.27 on admission CHADS2Vasc2 score of 4 Hold Coumadin. 2 mg of vitamin K Continue Cardizem for rate control    #4 CAD/NSTEMI: Troponin elevation consistent with demand ischemia in the setting of profound anemia. B NP mildly elevated but chest x-ray negative for edema. Continue statin.   #6 Moderate to Severe Pulmonary Hypertension/ AS: Monitor closely for respiratory symptoms Outpatient follow-up on discharge          Code Status: DNR DVT Prophylaxis:  SCD's Family Communication: Discussed in detail with the patient, all imaging results, lab results explained to the patient and son at bedside   Disposition Plan: Home when hemoglobin stable and diagnostic workup completed  Time Spent in minutes 25 minutes  Procedures:    Consultants:   Gastroenterology, Dr. Watt Climes.    Antimicrobials:      Medications  Scheduled Meds: . atorvastatin  10 mg Oral QHS  . calcium-vitamin D  1 tablet Oral Daily  . carvedilol  3.125 mg Oral BID  . cholecalciferol  1,000 Units Oral Daily  . colestipol  1 g Oral TID  . cycloSPORINE  1 drop Both Eyes BID  .  diltiazem  120 mg Oral Daily  . feeding supplement  1 Container Oral TID BM  . fluticasone  2 spray Each Nare BID  . furosemide  20 mg Intravenous Daily  . gabapentin  300 mg Oral BID  . gabapentin  900 mg Oral QHS  . loratadine  10 mg Oral Daily  . pantoprazole  40 mg Oral Daily  . phytonadione  2 mg Oral Once  . sodium chloride flush  3 mL Intravenous Q12H   Continuous Infusions:  PRN Meds:.acetaminophen, ondansetron **OR** ondansetron (ZOFRAN) IV   Antibiotics   Anti-infectives (From admission, onward)   None        Subjective:   [name] was seen and examined today. Sitting comfortably in chair having breakfast. No more sob at rest, no fever or chills  Objective:   Vitals:   12/07/17 1545 12/07/17 1937 12/08/17 0013 12/08/17 0437  BP: (!) 113/55 (!) 112/57 (!)  114/49 (!) 124/46  Pulse: 70 75 68 66  Resp:  20 18 20   Temp: 98.3 F (36.8 C) 98.1 F (36.7 C) 98.3 F (36.8 C) 98.5 F (36.9 C)  TempSrc: Oral Oral Oral Oral  SpO2: 95% 99% 98% 95%  Weight:    51 kg (112 lb 8 oz)  Height:        Intake/Output Summary (Last 24 hours) at 12/08/2017 3818 Last data filed at 12/08/2017 0700 Gross per 24 hour  Intake 930 ml  Output 2000 ml  Net -1070 ml     Wt Readings from Last 3 Encounters:  12/08/17 51 kg (112 lb 8 oz)  11/08/17 52.9 kg (116 lb 9.6 oz)  10/21/17 52.2 kg (115 lb)     Exam  General: NAD  HEENT: NCAT,  PERRL,MMM  Neck: SUPPLE, (-) JVD  Cardiovascular: RRR, (-) GALLOP, (+) SYS MURMUR  Respiratory: few occ b/l rales at bases otherwise CTA  Gastrointestinal: SOFT, (-) DISTENSION, BS(+), (_) TENDERNESS  Ext: (-) CYANOSIS, (-) EDEMA  Neuro: A, OX 3  Skin:(-) RASH  Psych:NORMAL AFFECT/MOOD   Data Reviewed:  I have personally reviewed following labs and imaging studies  Micro Results No results found for this or any previous visit (from the past 240 hour(s)).  Radiology Reports Dg Chest 2 View  Result Date: 12/06/2017 CLINICAL DATA:  Shortness of breath EXAM: CHEST - 2 VIEW COMPARISON:  August 02, 2016 FINDINGS: There is no edema or consolidation. Heart is borderline enlarged with pulmonary vascularity within normal limits. There is aortic atherosclerosis. No adenopathy. No evident bone lesions. IMPRESSION: Aortic atherosclerosis. Heart borderline enlarged. No edema or consolidation. Electronically Signed   By: Lowella Grip III M.D.   On: 12/06/2017 11:06   Ct Head Wo Contrast  Result Date: 12/06/2017 CLINICAL DATA:  [place] last night at assisted living facility, no loss of consciousness; having chest pain EXAM: CT HEAD WITHOUT CONTRAST CT CERVICAL SPINE WITHOUT CONTRAST TECHNIQUE: Multidetector CT imaging of the head and cervical spine was performed following the standard protocol without intravenous  contrast. Multiplanar CT image reconstructions of the cervical spine were also generated. COMPARISON:  CT head 06/04/2016 FINDINGS: CT HEAD FINDINGS Brain: Generalized atrophy. Normal ventricular morphology. No midline shift or mass effect. Minimal small vessel chronic ischemic changes of deep cerebral white matter. No intracranial hemorrhage, mass lesion or evidence of acute infarction. No extra-axial fluid collections. Vascular: Mild atherosclerotic calcification within internal carotid arteries at skull base Skull: Intact Sinuses/Orbits: Partial opacification of ethmoid air cells and frontal sinus. Small air-fluid levels  in maxillary sinuses bilaterally. Other: N/A CT CERVICAL SPINE FINDINGS Alignment: Mild anterolisthesis at C4-C5 with minimal retrolisthesis at C5-C6 and C6-C7 Skull base and vertebrae: Diffuse osseous demineralization. Visualized skull base intact. Multilevel advanced facet degenerative changes. Advanced degenerative disc disease changes at C5-C6 and C6-C7 with disc space narrowing and endplate spur formation. Vertebral body heights maintained without fracture or bone destruction. Soft tissues and spinal canal: Prevertebral soft tissues normal thickness. Disc levels:  Mild pseudo disc at C4-C5 Upper chest: Emphysematous changes at lung apices Other: N/A IMPRESSION: Atrophy with minimal small vessel chronic ischemic changes of deep cerebral white matter. No acute intracranial abnormalities. Scattered sinus disease changes as above including small air-fluid levels in the maxillary sinuses. Degenerative disc and facet disease changes of the cervical spine. No acute cervical spine abnormalities. Electronically Signed   By: Lavonia Dana M.D.   On: 12/06/2017 12:35   Ct Cervical Spine Wo Contrast  Result Date: 12/06/2017 CLINICAL DATA:  [place] last night at assisted living facility, no loss of consciousness; having chest pain EXAM: CT HEAD WITHOUT CONTRAST CT CERVICAL SPINE WITHOUT CONTRAST  TECHNIQUE: Multidetector CT imaging of the head and cervical spine was performed following the standard protocol without intravenous contrast. Multiplanar CT image reconstructions of the cervical spine were also generated. COMPARISON:  CT head 06/04/2016 FINDINGS: CT HEAD FINDINGS Brain: Generalized atrophy. Normal ventricular morphology. No midline shift or mass effect. Minimal small vessel chronic ischemic changes of deep cerebral white matter. No intracranial hemorrhage, mass lesion or evidence of acute infarction. No extra-axial fluid collections. Vascular: Mild atherosclerotic calcification within internal carotid arteries at skull base Skull: Intact Sinuses/Orbits: Partial opacification of ethmoid air cells and frontal sinus. Small air-fluid levels in maxillary sinuses bilaterally. Other: N/A CT CERVICAL SPINE FINDINGS Alignment: Mild anterolisthesis at C4-C5 with minimal retrolisthesis at C5-C6 and C6-C7 Skull base and vertebrae: Diffuse osseous demineralization. Visualized skull base intact. Multilevel advanced facet degenerative changes. Advanced degenerative disc disease changes at C5-C6 and C6-C7 with disc space narrowing and endplate spur formation. Vertebral body heights maintained without fracture or bone destruction. Soft tissues and spinal canal: Prevertebral soft tissues normal thickness. Disc levels:  Mild pseudo disc at C4-C5 Upper chest: Emphysematous changes at lung apices Other: N/A IMPRESSION: Atrophy with minimal small vessel chronic ischemic changes of deep cerebral white matter. No acute intracranial abnormalities. Scattered sinus disease changes as above including small air-fluid levels in the maxillary sinuses. Degenerative disc and facet disease changes of the cervical spine. No acute cervical spine abnormalities. Electronically Signed   By: Lavonia Dana M.D.   On: 12/06/2017 12:35   Dg Chest Port 1 View  Result Date: 12/07/2017 CLINICAL DATA:  Shortness of breath and chest pain for 1  day. EXAM: PORTABLE CHEST 1 VIEW COMPARISON:  Chest radiograph December 06, 2017 FINDINGS: The cardiac silhouette is mildly enlarged and unchanged. Calcified aortic knob. Similar fullness of the hila with new interstitial prominence. Mitral annular calcification. Small pleural effusions. No focal consolidation. Biapical pleuroparenchymal scarring with calcification. No pneumothorax. Osteopenia. Mild thoracolumbar levoscoliosis. IMPRESSION: Increasing interstitial prominence concerning for pulmonary edema with small pleural effusions. Stable cardiomegaly. Aortic Atherosclerosis (ICD10-I70.0). Electronically Signed   By: Elon Alas M.D.   On: 12/07/2017 16:27    Lab Data:  CBC: Recent Labs  Lab 12/06/17 1052 12/07/17 0217 12/07/17 0944 12/07/17 1755 12/08/17 0136  WBC 10.8*  --   --   --  10.8*  NEUTROABS 9.0*  --   --   --  8.1*  HGB 4.0* 7.0* 7.3* 10.0* 9.5*  9.6*  HCT 13.8* 21.6* 22.4* 30.4* 28.9*  29.0*  MCV 79.8  --   --   --  81.6  PLT 258  --   --   --  354   Basic Metabolic Panel: Recent Labs  Lab 12/06/17 1052 12/07/17 0217 12/08/17 0136  NA 139 141 139  K 3.8 3.3* 4.3  CL 106 107 102  CO2 23 24 27   GLUCOSE 116* 87 101*  BUN 52* 36* 22*  CREATININE 0.84 0.68 0.73  CALCIUM 8.9 8.5* 8.6*   GFR: Estimated Creatinine Clearance: 35.4 mL/min (by C-G formula based on SCr of 0.73 mg/dL). Liver Function Tests: Recent Labs  Lab 12/06/17 1052  AST 32  ALT 15  ALKPHOS 40  BILITOT 0.6  PROT 6.1*  ALBUMIN 3.2*   No results for input(s): LIPASE, AMYLASE in the last 168 hours. No results for input(s): AMMONIA in the last 168 hours. Coagulation Profile: Recent Labs  Lab 12/06/17 1052 12/08/17 0136  INR 3.27 2.38   Cardiac Enzymes: Recent Labs  Lab 12/06/17 1052 12/06/17 1158  CKTOTAL 113  --   TROPONINI  --  0.24*   BNP (last 3 results) No results for input(s): PROBNP in the last 8760 hours. HbA1C: No results for input(s): HGBA1C in the last 72  hours. CBG: No results for input(s): GLUCAP in the last 168 hours. Lipid Profile: No results for input(s): CHOL, HDL, LDLCALC, TRIG, CHOLHDL, LDLDIRECT in the last 72 hours. Thyroid Function Tests: No results for input(s): TSH, T4TOTAL, FREET4, T3FREE, THYROIDAB in the last 72 hours. Anemia Panel: No results for input(s): VITAMINB12, FOLATE, FERRITIN, TIBC, IRON, RETICCTPCT in the last 72 hours. Urine analysis:    Component Value Date/Time   COLORURINE YELLOW 02/13/2017 1229   APPEARANCEUR CLEAR 02/13/2017 1229   LABSPEC 1.013 02/13/2017 1229   PHURINE 7.0 02/13/2017 1229   GLUCOSEU NEGATIVE 02/13/2017 1229   HGBUR NEGATIVE 02/13/2017 1229   BILIRUBINUR NEGATIVE 02/13/2017 1229   KETONESUR NEGATIVE 02/13/2017 1229   PROTEINUR NEGATIVE 02/13/2017 1229   UROBILINOGEN 0.2 05/09/2015 1038   NITRITE NEGATIVE 02/13/2017 1229   LEUKOCYTESUR NEGATIVE 02/13/2017 1229     Benito Mccreedy M.D. [facility] 12/08/2017, 8:38 AM  Pager: [phone] Between 7am to 7pm - call Pager - [phone]  After 7pm go to [email or web address] - password [email or web address]  Call night coverage person covering after 7pm

## 2017-12-09 ENCOUNTER — Inpatient Hospital Stay (HOSPITAL_COMMUNITY): Payer: Medicare Other

## 2017-12-09 DIAGNOSIS — R0600 Dyspnea, unspecified: Secondary | ICD-10-CM

## 2017-12-09 LAB — COMPREHENSIVE METABOLIC PANEL
ALT: 15 U/L (ref 14–54)
AST: 22 U/L (ref 15–41)
Albumin: 2.6 g/dL — ABNORMAL LOW (ref 3.5–5.0)
Alkaline Phosphatase: 43 U/L (ref 38–126)
Anion gap: 8 (ref 5–15)
BUN: 24 mg/dL — ABNORMAL HIGH (ref 6–20)
CO2: 26 mmol/L (ref 22–32)
Calcium: 8.1 mg/dL — ABNORMAL LOW (ref 8.9–10.3)
Chloride: 102 mmol/L (ref 101–111)
Creatinine, Ser: 0.66 mg/dL (ref 0.44–1.00)
GFR calc Af Amer: >60 mL/min (ref >60)
GFR calc non Af Amer: >60 mL/min (ref >60)
Glucose, Bld: 117 mg/dL — ABNORMAL HIGH (ref 65–99)
Potassium: 3.1 mmol/L — ABNORMAL LOW (ref 3.5–5.1)
Sodium: 136 mmol/L (ref 135–145)
Total Bilirubin: 0.8 mg/dL (ref 0.3–1.2)
Total Protein: 5.5 g/dL — ABNORMAL LOW (ref 6.5–8.1)

## 2017-12-09 LAB — CBC
HCT: 30.1 % — ABNORMAL LOW (ref 36.0–46.0)
Hemoglobin: 9.7 g/dL — ABNORMAL LOW (ref 12.0–15.0)
MCH: 26.9 pg (ref 26.0–34.0)
MCHC: 32.2 g/dL (ref 30.0–36.0)
MCV: 83.4 fL (ref 78.0–100.0)
Platelets: 222 10*3/uL (ref 150–400)
RBC: 3.61 MIL/uL — ABNORMAL LOW (ref 3.87–5.11)
RDW: 17.3 % — ABNORMAL HIGH (ref 11.5–15.5)
WBC: 8 10*3/uL (ref 4.0–10.5)

## 2017-12-09 LAB — HEMOGLOBIN AND HEMATOCRIT, BLOOD
HCT: 29.2 % — ABNORMAL LOW (ref 36.0–46.0)
Hemoglobin: 9.1 g/dL — ABNORMAL LOW (ref 12.0–15.0)

## 2017-12-09 LAB — ECHOCARDIOGRAM LIMITED
Height: 64 in
Weight: 1858.92 oz

## 2017-12-09 LAB — BRAIN NATRIURETIC PEPTIDE: B Natriuretic Peptide: 933.3 pg/mL — ABNORMAL HIGH (ref 0.0–100.0)

## 2017-12-09 MED ORDER — POTASSIUM CHLORIDE CRYS ER 20 MEQ PO TBCR
40.0000 meq | EXTENDED_RELEASE_TABLET | Freq: Two times a day (BID) | ORAL | Status: AC
  Administered 2017-12-09 – 2017-12-10 (×3): 40 meq via ORAL
  Filled 2017-12-09 (×3): qty 2

## 2017-12-09 MED ORDER — FUROSEMIDE 10 MG/ML IJ SOLN
20.0000 mg | Freq: Every day | INTRAMUSCULAR | Status: AC
  Administered 2017-12-10: 20 mg via INTRAVENOUS
  Filled 2017-12-09 (×2): qty 2

## 2017-12-09 NOTE — Anesthesia Preprocedure Evaluation (Addendum)
Anesthesia Evaluation  Patient identified by MRN, date of birth, ID band Patient awake    Reviewed: Allergy & Precautions, NPO status , Patient's Chart, lab work & pertinent test results, reviewed documented beta blocker date and time   Airway Mallampati: II  TM Distance: >3 FB Neck ROM: Full    Dental   Pulmonary former smoker,    breath sounds clear to auscultation       Cardiovascular hypertension, Pt. on medications and Pt. on home beta blockers + CAD, + Past MI, + Peripheral Vascular Disease and +CHF   Rhythm:Regular Rate:Normal  Normal EF. Mod MR/TR. Mild AS   Neuro/Psych negative neurological ROS     GI/Hepatic Neg liver ROS, hiatal hernia, GERD  ,  Endo/Other  negative endocrine ROS  Renal/GU Renal disease     Musculoskeletal  (+) Arthritis ,   Abdominal   Peds  Hematology  (+) anemia ,   Anesthesia Other Findings   Reproductive/Obstetrics                            Lab Results  Component Value Date   WBC 8.0 12/09/2017   HGB 9.1 (L) 12/09/2017   HCT 29.2 (L) 12/09/2017   MCV 83.4 12/09/2017   PLT 222 12/09/2017   Lab Results  Component Value Date   CREATININE 0.66 12/09/2017   BUN 24 (H) 12/09/2017   NA 136 12/09/2017   K 3.1 (L) 12/09/2017   CL 102 12/09/2017   CO2 26 12/09/2017    Anesthesia Physical Anesthesia Plan  ASA: III  Anesthesia Plan: MAC   Post-op Pain Management:    Induction: Intravenous  PONV Risk Score and Plan: 2 and Treatment may vary due to age or medical condition  Airway Management Planned: Natural Airway and Nasal Cannula  Additional Equipment:   Intra-op Plan:   Post-operative Plan:   Informed Consent: I have reviewed the patients History and Physical, chart, labs and discussed the procedure including the risks, benefits and alternatives for the proposed anesthesia with the patient or authorized representative who has indicated  his/her understanding and acceptance.     Plan Discussed with:   Anesthesia Plan Comments:        Anesthesia Quick Evaluation

## 2017-12-09 NOTE — Progress Notes (Signed)
[name] 9:48 AM  Subjective: Patient asymptomatic without signs of bleeding and feeling better today walking to the bathroom with her walker  Objective: Vital signs stable afebrile no acute distress patient looks well not examined today BUN and creatinine close to normal hemoglobin stable yesterday INR 2.38  Assessment: GI blood loss in a patient on Coumadin  Plan: Plan endoscopy tomorrow morning with further workup and plans pending those findings and consider consulted during her cardiologist time Claiborne Billings whether she truly needs to be back on Coumadin or not work can be managed with other blood thinners or just an aspirin  [facility] E  Pager [phone] After 5PM or if no answer call [phone]

## 2017-12-09 NOTE — Progress Notes (Signed)
*  PRELIMINARY RESULTS* Echocardiogram 2D Echocardiogram has been performed.  Jennette Dubin 12/09/2017, 12:12 PM

## 2017-12-09 NOTE — Progress Notes (Signed)
[facility] PROGRESS NOTE  [name] ZOX:[id number] DOB: 1922-08-05 DOA: 12/06/2017 PCP: Deland Pretty, MD  Brief summary   84 y.o.femalewith a PMH of CAD, PAF on chronic Coumadin, iron deficiency anemia, hypertension, hyperlipidemia and aortic stenosis presenting with 1 month history of black stools and progressively worsening shortness of breath, and noted to have hemoglobin of 4 with positive Hemoccult and INR of 3.27.  Patient admitted for GI bleed    Assessment/Plan:  Severe symptomatic aortic deficiency anemia due to chronic GI bleeding. Hemoglobin of 4 on admission with elevated INR. Given vitamin K. Coumadin on hold. Transfused PRBC. Hg is stable at 9.7 today. GI consulted, PPI with plans for endoscopy AM. Cont close  Monitor, f/u Hg. TF as needed  SOB/Pulm Edema. Post blood TF. BNP 953 with edema on CXR after episode of sob. Improved after lasix iv yesterday. Will cont monitor, d/c lasix AM  Moderate to Severe Pulmonary Hypertension/ AS: Monitor closely for respiratory symptoms Outpatient follow-up on discharge  Paroxysmal Atrial Fibrillation, on chronic anticoagulation: INR 3.27 on admission. CHADS2Vasc2 score of 4. Hold Coumadin. Received 2 mg of vitamin K. Continue Cardizem for rate control. Will need to readdress anticoagulation post procedure findings   H/o CAD/NSTEMI: Troponin elevation consistent with demand ischemia in the setting of profound anemia. BNP mildly elevated. No acute chest pains. Continue statin. BB. Antiplatelets on hold due to bleeding.   Hypokalemia. Replace. Recheck AM  Code Status: DNR Family Communication: d/w patient,, RN (indicate person spoken with, relationship, and if by phone, the number) Disposition Plan: home 24-48 hrs    Consultants:  GI  Procedures:  endoscopy pend   Antibiotics:  none (indicate start date, and stop date if known)  HPI/Subjective: Reports feeling well. mild dyspnea. No more acute  bleeding. Being scheduled for colonoscopy AM  Objective: Vitals:   12/09/17 0800 12/09/17 0856  BP: 102/68 102/68  Pulse: (!) 135 (!) 138  Resp: 18 18  Temp:    SpO2: 98% 100%    Intake/Output Summary (Last 24 hours) at 12/09/2017 1119 Last data filed at 12/09/2017 0942 Gross per 24 hour  Intake 360 ml  Output 1702 ml  Net -1342 ml   Filed Weights   12/06/17 1836 12/08/17 0437 12/09/17 0701  Weight: 50.4 kg (111 lb 1.6 oz) 51 kg (112 lb 8 oz) 52.7 kg (116 lb 2.9 oz)    Exam:   General:  No distress   Cardiovascular: s1,s2 rrr  Respiratory: few crackles LL  Abdomen: soft, nt   Musculoskeletal: no leg edema    Data Reviewed: Basic Metabolic Panel: Recent Labs  Lab 12/06/17 1052 12/07/17 0217 12/08/17 0136 12/09/17 0226  NA 139 141 139 136  K 3.8 3.3* 4.3 3.1*  CL 106 107 102 102  CO2 23 24 27 26   GLUCOSE 116* 87 101* 117*  BUN 52* 36* 22* 24*  CREATININE 0.84 0.68 0.73 0.66  CALCIUM 8.9 8.5* 8.6* 8.1*   Liver Function Tests: Recent Labs  Lab 12/06/17 1052 12/09/17 0226  AST 32 22  ALT 15 15  ALKPHOS 40 43  BILITOT 0.6 0.8  PROT 6.1* 5.5*  ALBUMIN 3.2* 2.6*   No results for input(s): LIPASE, AMYLASE in the last 168 hours. No results for input(s): AMMONIA in the last 168 hours. CBC: Recent Labs  Lab 12/06/17 1052  12/07/17 1755 12/08/17 0136 12/08/17 0920 12/08/17 1716 12/09/17 0226  WBC 10.8*  --   --  10.8*  --   --  8.0  NEUTROABS 9.0*  --   --  8.1*  --   --   --   HGB 4.0*   < > 10.0* 9.5*  9.6* 9.4* 9.4* 9.7*  HCT 13.8*   < > 30.4* 28.9*  29.0* 28.7* 29.2* 30.1*  MCV 79.8  --   --  81.6  --   --  83.4  PLT 258  --   --  196  --   --  222   < > = values in this interval not displayed.   Cardiac Enzymes: Recent Labs  Lab 12/06/17 1052 12/06/17 1158  CKTOTAL 113  --   TROPONINI  --  0.24*   BNP (last 3 results) Recent Labs    12/07/17 0944 12/08/17 0136 12/09/17 0226  BNP 953.0* 856.5* 933.3*    ProBNP (last 3  results) No results for input(s): PROBNP in the last 8760 hours.  CBG: No results for input(s): GLUCAP in the last 168 hours.  No results found for this or any previous visit (from the past 240 hour(s)).   Studies: Dg Chest Port 1 View  Result Date: 12/07/2017 CLINICAL DATA:  Shortness of breath and chest pain for 1 day. EXAM: PORTABLE CHEST 1 VIEW COMPARISON:  Chest radiograph December 06, 2017 FINDINGS: The cardiac silhouette is mildly enlarged and unchanged. Calcified aortic knob. Similar fullness of the hila with new interstitial prominence. Mitral annular calcification. Small pleural effusions. No focal consolidation. Biapical pleuroparenchymal scarring with calcification. No pneumothorax. Osteopenia. Mild thoracolumbar levoscoliosis. IMPRESSION: Increasing interstitial prominence concerning for pulmonary edema with small pleural effusions. Stable cardiomegaly. Aortic Atherosclerosis (ICD10-I70.0). Electronically Signed   By: Elon Alas M.D.   On: 12/07/2017 16:27    Scheduled Meds: . atorvastatin  10 mg Oral QHS  . calcium-vitamin D  1 tablet Oral Daily  . carvedilol  3.125 mg Oral BID  . cholecalciferol  1,000 Units Oral Daily  . colestipol  1 g Oral TID  . cycloSPORINE  1 drop Both Eyes BID  . diltiazem  120 mg Oral Daily  . feeding supplement  1 Container Oral TID BM  . fluticasone  2 spray Each Nare BID  . furosemide  20 mg Intravenous Daily  . gabapentin  300 mg Oral BID  . gabapentin  900 mg Oral QHS  . loratadine  10 mg Oral Daily  . pantoprazole  40 mg Oral Daily  . phytonadione  2 mg Oral Once  . sodium chloride flush  3 mL Intravenous Q12H   Continuous Infusions:  Principal Problem:   Severe anemia Active Problems:   Moderate to severe pulmonary hypertension (HCC)   Dyspnea on exertion   Valvular heart disease   NSTEMI (non-ST elevated myocardial infarction) (HCC)   PAF (paroxysmal atrial fibrillation) (HCC)   Chronic anticoagulation, with coumadin, with  PAF and CHADS2Vasc2 score of 4   Anemia, iron deficiency   Long term current use of anticoagulant therapy   CAD (coronary artery disease)   Chronic GI bleeding    Time spent: >35 minutes     Kinnie Feil  [facility] Pager [phone]. If 7PM-7AM, please contact night-coverage at [email or web address], password [email or web address] 12/09/2017, 11:19 AM  LOS: 3 days

## 2017-12-09 NOTE — Progress Notes (Signed)
Patient converted to Afib with RVR. On call MD notified and new orders for lopressor given.

## 2017-12-09 NOTE — Progress Notes (Signed)
   12/09/17 1100  Clinical Encounter Type  Visited With Patient  Visit Type Initial  Referral From Nurse  Consult/Referral To Chaplain  Spiritual Encounters  Spiritual Needs Prayer  Stress Factors  Patient Stress Factors Exhausted  Family Stress Factors None identified   Pt was sitting on a chair looking through a mirror putting on make-up. Patient very receptive and cheerful. Chaplain had a very good conversation with pt about christian faith. Chaplain provided reflective listening, compassionate presence and prayer.

## 2017-12-10 ENCOUNTER — Encounter (HOSPITAL_COMMUNITY): Payer: Self-pay | Admitting: Certified Registered Nurse Anesthetist

## 2017-12-10 ENCOUNTER — Inpatient Hospital Stay (HOSPITAL_COMMUNITY): Payer: Medicare Other

## 2017-12-10 ENCOUNTER — Inpatient Hospital Stay (HOSPITAL_COMMUNITY): Payer: Medicare Other | Admitting: Anesthesiology

## 2017-12-10 ENCOUNTER — Encounter (HOSPITAL_COMMUNITY)
Admission: EM | Disposition: A | Discharge status: Skilled Nursing Facility | Payer: Self-pay | Source: Home / Self Care | Attending: Family Medicine

## 2017-12-10 DIAGNOSIS — D509 Iron deficiency anemia, unspecified: Secondary | ICD-10-CM

## 2017-12-10 HISTORY — PX: ESOPHAGOGASTRODUODENOSCOPY: SHX5428

## 2017-12-10 LAB — BPAM RBC
Blood Product Expiration Date: 201904262359
Blood Product Expiration Date: 201904262359
Blood Product Expiration Date: 201905082359
Blood Product Expiration Date: 201905092359
ISSUE DATE / TIME: 201904191325
ISSUE DATE / TIME: 201904192037
ISSUE DATE / TIME: 201904201150
Unit Type and Rh: 5100
Unit Type and Rh: 5100
Unit Type and Rh: 5100
Unit Type and Rh: 5100

## 2017-12-10 LAB — TYPE AND SCREEN
ABO/RH(D): O POS
Antibody Screen: NEGATIVE
Unit division: 0
Unit division: 0
Unit division: 0
Unit division: 0

## 2017-12-10 LAB — BASIC METABOLIC PANEL
Anion gap: 9 (ref 5–15)
BUN: 26 mg/dL — ABNORMAL HIGH (ref 6–20)
CO2: 25 mmol/L (ref 22–32)
Calcium: 8.6 mg/dL — ABNORMAL LOW (ref 8.9–10.3)
Chloride: 103 mmol/L (ref 101–111)
Creatinine, Ser: 0.72 mg/dL (ref 0.44–1.00)
GFR calc Af Amer: >60 mL/min (ref >60)
GFR calc non Af Amer: >60 mL/min (ref >60)
Glucose, Bld: 114 mg/dL — ABNORMAL HIGH (ref 65–99)
Potassium: 4.9 mmol/L (ref 3.5–5.1)
Sodium: 137 mmol/L (ref 135–145)

## 2017-12-10 LAB — CBC
HCT: 29.9 % — ABNORMAL LOW (ref 36.0–46.0)
Hemoglobin: 9.6 g/dL — ABNORMAL LOW (ref 12.0–15.0)
MCH: 27.4 pg (ref 26.0–34.0)
MCHC: 32.1 g/dL (ref 30.0–36.0)
MCV: 85.4 fL (ref 78.0–100.0)
Platelets: 237 10*3/uL (ref 150–400)
RBC: 3.5 MIL/uL — ABNORMAL LOW (ref 3.87–5.11)
RDW: 18.1 % — ABNORMAL HIGH (ref 11.5–15.5)
WBC: 7.6 10*3/uL (ref 4.0–10.5)

## 2017-12-10 LAB — PROTIME-INR
INR: 1.31
Prothrombin Time: 16.2 seconds — ABNORMAL HIGH (ref 11.4–15.2)

## 2017-12-10 LAB — HEMOGLOBIN AND HEMATOCRIT, BLOOD
HCT: 32.3 % — ABNORMAL LOW (ref 36.0–46.0)
Hemoglobin: 10 g/dL — ABNORMAL LOW (ref 12.0–15.0)

## 2017-12-10 SURGERY — EGD (ESOPHAGOGASTRODUODENOSCOPY)
Anesthesia: Monitor Anesthesia Care

## 2017-12-10 MED ORDER — PROPOFOL 10 MG/ML IV BOLUS
INTRAVENOUS | Status: DC | PRN
  Administered 2017-12-10 (×3): 20 mg via INTRAVENOUS

## 2017-12-10 MED ORDER — ADULT MULTIVITAMIN W/MINERALS CH
1.0000 | ORAL_TABLET | Freq: Every day | ORAL | Status: DC
  Administered 2017-12-10 – 2017-12-17 (×8): 1 via ORAL
  Filled 2017-12-10 (×8): qty 1

## 2017-12-10 MED ORDER — LACTATED RINGERS IV SOLN
INTRAVENOUS | Status: AC | PRN
  Administered 2017-12-10: 1000 mL via INTRAVENOUS

## 2017-12-10 MED ORDER — SODIUM CHLORIDE 0.9 % IV SOLN
INTRAVENOUS | Status: DC
  Administered 2017-12-10: 07:00:00 via INTRAVENOUS

## 2017-12-10 MED ORDER — PHENYLEPHRINE 40 MCG/ML (10ML) SYRINGE FOR IV PUSH (FOR BLOOD PRESSURE SUPPORT)
PREFILLED_SYRINGE | INTRAVENOUS | Status: DC | PRN
  Administered 2017-12-10: 120 ug via INTRAVENOUS
  Administered 2017-12-10: 80 ug via INTRAVENOUS

## 2017-12-10 NOTE — Transfer of Care (Signed)
Immediate Anesthesia Transfer of Care Note  Patient: [name]  Procedure(s) Performed: ESOPHAGOGASTRODUODENOSCOPY (EGD) (N/A )  Patient Location: PACU  Anesthesia Type:MAC  Level of Consciousness: awake, alert  and oriented  Airway & Oxygen Therapy: Patient Spontanous Breathing and Patient connected to nasal cannula oxygen  Post-op Assessment: Report given to RN and Post -op Vital signs reviewed and stable  Post vital signs: Reviewed and stable  Last Vitals:  Vitals Value Taken Time  BP 69/49 12/10/2017  8:09 AM  Temp 36.9 C 12/10/2017  8:12 AM  Pulse 33 12/10/2017  8:12 AM  Resp 25 12/10/2017  8:12 AM  SpO2 100 % 12/10/2017  8:12 AM  Vitals shown include unvalidated device data.  Last Pain:  Vitals:   12/10/17 0812  TempSrc: Oral  PainSc: 0-No pain      Patients Stated Pain Goal: 2 (80/16/55 3748)  Complications: No apparent anesthesia complications

## 2017-12-10 NOTE — Anesthesia Postprocedure Evaluation (Signed)
Anesthesia Post Note  Patient: [name]  Procedure(s) Performed: ESOPHAGOGASTRODUODENOSCOPY (EGD) (N/A )     Patient location during evaluation: PACU Anesthesia Type: MAC Level of consciousness: awake and alert Pain management: pain level controlled Vital Signs Assessment: post-procedure vital signs reviewed and stable Respiratory status: spontaneous breathing, nonlabored ventilation, respiratory function stable and patient connected to nasal cannula oxygen Cardiovascular status: stable and blood pressure returned to baseline Postop Assessment: no apparent nausea or vomiting Anesthetic complications: no    Last Vitals:  Vitals:   12/10/17 0830 12/10/17 0838  BP: (!) 98/55 (!) 104/53  Pulse:  (!) 125  Resp: 20 20  Temp:    SpO2: 99% 99%    Last Pain:  Vitals:   12/10/17 0838  TempSrc:   PainSc: 0-No pain                 Tiajuana Amass

## 2017-12-10 NOTE — Progress Notes (Signed)
Initial Nutrition Assessment  DOCUMENTATION CODES:   Not applicable  INTERVENTION:   -Continue Boost Breeze po TID, each supplement provides 250 kcal and 9 grams of protein -MVI daily  NUTRITION DIAGNOSIS:   Increased nutrient needs related to acute illness as evidenced by estimated needs.  Ongoing  GOAL:   Patient will meet greater than or equal to 90% of their needs  Progressing  MONITOR:   PO intake, Supplement acceptance, Diet advancement, Labs, Weight trends, I & O's  REASON FOR ASSESSMENT:   Malnutrition Screening Tool    ASSESSMENT:   [name] is an 84 y.o. female with a PMH of CAD, PAF on chronic Coumadin, iron deficiency anemia, hypertension, hyperlipidemia and aortic stenosis who was admitted with a chief complaint of a fall in the setting of a one-month history of black stools and progressive shortness of breath.  4/23- s/p upper GI- revealed hiatal hernia a benign appearing esophageal stenosis, exam otherwise normal  Reviewed I/O's: -500 ml x 24 hours and -682 ml since admission.   Per GI notes, plan to hold colonoscopy for now.   Spoke with pt at bedside, who is in good spirits. She reports a chronically poor appetite. She reports she typically eats "small portions" for meals PTA. She reports that she is trying to eat what she can, but is "pretty much force-feeding myself". Pt has consumed a few green beans and about 25% of chicken and dumplings. Meal completion is variable; PO: 10-100%. Pt really enjoys the Boost Breeze supplements (especially the orange flavored ones); she reports her son is going to purchase some for her to use once she returns to [facility]. Discussed with pt importance of good meal and supplement intake to promote healing. Encouraged continuing supplements after discharge.   Labs reviewed.   Diet Order:  DIET SOFT Room service appropriate? Yes; Fluid consistency: Thin  EDUCATION NEEDS:   No education needs have been  identified at this time  Skin:  Skin Assessment: Reviewed RN Assessment  Last BM:  12/09/17  Height:   Ht Readings from Last 1 Encounters:  12/10/17 5\' 4"  (1.626 m)    Weight:   Wt Readings from Last 1 Encounters:  12/10/17 107 lb (48.5 kg)    Ideal Body Weight:  54.54 kg  BMI:  Body mass index is 18.37 kg/m.  Estimated Nutritional Needs:   Kcal:  1250-1500 (25-30 kcal/kg bw)  Protein:  65-75g pro (1.3-1.5 g/kg bw)  Fluid:  >1.3 L (25 ml/kg bw)    Olga Seyler A. Jimmye Norman, RD, LDN, CDE Pager: [phone] After hours Pager: [phone]

## 2017-12-10 NOTE — Op Note (Signed)
[facility] Patient Name: [name] Procedure Date : 12/10/2017 MRN: [id number] Attending MD: Clarene Essex , MD Date of Birth: [birth year] CSN: [id number] Age: 84 Admit Type: Inpatient Procedure:                Upper GI endoscopy Indications:              Melena Providers:                Clarene Essex, MD, Cleda Daub, RN, Charolette Child,                            Technician Referring MD:              Medicines:                Propofol total dose 60 mg IV Complications:            No immediate complications. Estimated Blood Loss:     Estimated blood loss: none. Procedure:                Pre-Anesthesia Assessment:                           - Prior to the procedure, a History and Physical                            was performed, and patient medications and                            allergies were reviewed. The patient's tolerance of                            previous anesthesia was also reviewed. The risks                            and benefits of the procedure and the sedation                            options and risks were discussed with the patient.                            All questions were answered, and informed consent                            was obtained. Prior Anticoagulants: The patient has                            taken Coumadin (warfarin), last dose was 5 days                            prior to procedure. ASA Grade Assessment: III - A                            patient with severe systemic disease. After  reviewing the risks and benefits, the patient was                            deemed in satisfactory condition to undergo the                            procedure.                           After obtaining informed consent, the endoscope was                            passed under direct vision. Throughout the                            procedure, the patient's blood pressure, pulse, and   oxygen saturations were monitored continuously. The                            [id number] ([id number]) scope was introduced through the                            mouth, and advanced to the second part of duodenum.                            The upper GI endoscopy was accomplished without                            difficulty. The patient tolerated the procedure                            well. Scope In: Scope Out: Findings:      The larynx was normal.      A medium-sized hiatal hernia was present.      One benign-appearing, intrinsic mild stenosis was found. The stenosis       was traversed.      The entire examined stomach was normal.      The duodenal bulb, first portion of the duodenum and second portion of       the duodenum were normal.      The exam was otherwise without abnormality. Impression:               - Normal larynx.                           - Medium-sized hiatal hernia.                           - Benign-appearing esophageal stenosis.                           - Normal stomach.                           - Normal duodenal bulb, first portion of the  duodenum and second portion of the duodenum.                           - The examination was otherwise normal.                           - No specimens collected. Recommendation:           - Patient has a contact number available for                            emergencies. The signs and symptoms of potential                            delayed complications were discussed with the                            patient. Return to normal activities tomorrow.                            Written discharge instructions were provided to the                            patient.                           - Soft diet today.                           - Continue present medications.                           - Return to GI clinic PRN.                           - Telephone GI clinic if symptomatic PRN. Procedure  Code(s):        --- Professional ---                           [phone], Esophagogastroduodenoscopy, flexible,                            transoral; diagnostic, including collection of                            specimen(s) by brushing or washing, when performed                            (separate procedure) Diagnosis Code(s):        --- Professional ---                           K44.9, Diaphragmatic hernia without obstruction or                            gangrene  K22.2, Esophageal obstruction                           K92.1, Melena (includes Hematochezia) CPT copyright 2017 American Medical Association. All rights reserved. The codes documented in this report are preliminary and upon coder review may  be revised to meet current compliance requirements. Clarene Essex, MD 12/10/2017 8:08:14 AM This report has been signed electronically. Number of Addenda: 0

## 2017-12-10 NOTE — Consult Note (Signed)
   [facility] Inpatient Consult   12/10/2017  [name] [birth year] [id number]  Patient screened for potential [facility] services. Patient is in the [place] of the [facility] services under patient's Medicare plan.  Chart reviewed for needs.  Patient lives at [facility], has housekeeping, meals, and transportation. No current [facility] Care Management needs noted,  Please place a [facility] consult or for questions contact:   Natividad Brood, RN BSN [facility] Liaison  [phone] business mobile phone Toll free office [phone]

## 2017-12-10 NOTE — Progress Notes (Signed)
Patient's and go without signs of bleeding please see report for details will advance diet and hopefully she can go home soon and would discuss with Dr. Ellouise Newer her cardiologist whether she truly needs Coumadin or not and I discussed holding colonoscopy and further workup for now with the patient and her son and based on the risks of colonoscopy will hold off for now but happy to workup further if signs of ongoing GI blood loss in the future and happy to see back in the office in a few weeks

## 2017-12-10 NOTE — Progress Notes (Signed)
[name] 7:42 AM  Subjective: Patient doing well without any complaints and no signs of bleeding and case discussed with her son as well as  Objective: Vital signs stable afebrile exam please see preassessment evaluation labs okay INR decreased  Assessment: GI bleed on Coumadin  Plan: Okay to proceed with endoscopy with anesthesia assistance with further workup plans and recommendations pending those findings  [facility] E  Pager [phone] After 5PM or if no answer call [phone]

## 2017-12-10 NOTE — Progress Notes (Signed)
Please see in addition to PT note:   SATURATION QUALIFICATIONS: (This note is used to comply with regulatory documentation for home oxygen)  Patient Saturations on Room Air at Rest = 97%  Patient Saturations on Room Air while Ambulating = 84%  Patient Saturations on 2 Liters of oxygen while Ambulating = DNT, able to recover SpO2 with seated rest   Please briefly explain why patient needs home oxygen:Significant SpO2 drop to 84% with ambulation on room air  Deniece Ree PT, DPT, [place]   Pager [phone]

## 2017-12-10 NOTE — Evaluation (Signed)
Physical Therapy Evaluation Patient Details Name: [name] MRN: [id number] DOB: 14-Sep-1921 Today's Date: 12/10/2017   History of Present Illness  84yo female admitted after a fall and 1 month of black stools, SOB. Diagnosed with severe iron deficient anemia secondary to GI bleed, dypsnea, and elevated troponins. PMH anemia, anxiety, OA, HTN, hx L foot fracture treated with surgery, memory disorder, neuropathy, PAF, hx cardiac cath, TKR, hernia repair   Clinical Impression   Patient received up in chair, pleasant and willing to participate in PT this afternoon; she reports that at baseline she now mostly uses an electric WC due to her doctor's concerns about her HR with A-fib and her recent falls. At rest and on room air, SpO2 is 97% and HR 96-111BPM. She is able to perform functional transfers and gait approximately 63f with min guard, however gait distance was limited by SpO2 drop to 84% and HR elevation to 136BPM on room air. Both HR and SpO2 recovered with seated rest and pursed lip breathing. She was left in bed with all needs otherwise met this afternoon. Moving forward she will benefit from skilled PT services in the acute setting as well as skilled HHPT services, or in-house PT at her facility if available.     Follow Up Recommendations Home health PT;Other (comment)(would benefit from PT at her independent living facility )    Equipment Recommendations  3in1 (PT)    Recommendations for Other Services       Precautions / Restrictions Precautions Precautions: Fall;Other (comment) Precaution Comments: watch SpO2 and HR  Restrictions Weight Bearing Restrictions: No      Mobility  Bed Mobility Overal bed mobility: Modified Independent Bed Mobility: Sit to Supine       Sit to supine: Modified independent (Device/Increase time)      Transfers Overall transfer level: Needs assistance Equipment used: Rolling walker (2 wheeled) Transfers: Sit to/from Stand Sit to  Stand: Min guard         General transfer comment: Min guard and VC for safety, increased effort   Ambulation/Gait Ambulation/Gait assistance: Min guard Ambulation Distance (Feet): 40 Feet Assistive device: Rolling walker (2 wheeled) Gait Pattern/deviations: Step-through pattern;Trunk flexed     General Gait Details: steady with no balance deficit noted with RW, however gait distance limited by vitals as SpO2 dropped to 84% on room air, HR elevation to 136BPM; both SpO2 and HR recovered with seated rest and pursed lip breathing   Stairs            Wheelchair Mobility    Modified Rankin (Stroke Patients Only)       Balance Overall balance assessment: Needs assistance;History of Falls Sitting-balance support: Bilateral upper extremity supported;Feet supported Sitting balance-Leahy Scale: Good     Standing balance support: Bilateral upper extremity supported;During functional activity Standing balance-Leahy Scale: Fair Standing balance comment: heavy reliance on B UE support                              Pertinent Vitals/Pain Pain Assessment: No/denies pain    Home Living Family/patient expects to be discharged to:: Private residence(independent living facility ) Living Arrangements: Alone Available Help at Discharge: Family;Available PRN/intermittently Type of Home: Independent living facility([facility]) Home Access: Level entry     Home Layout: One level Home Equipment: Walker - 4 wheels;Cane - single point;Wheelchair - power      Prior Function Level of Independence: Independent;Independent with assistive device(s)  Comments: can perform self care, mostly uses electric WC due to MD recommendation secondary to concerns over HR/A-fib      Hand Dominance        Extremity/Trunk Assessment        Lower Extremity Assessment Lower Extremity Assessment: Generalized weakness    Cervical / Trunk Assessment Cervical /  Trunk Assessment: Kyphotic  Communication   Communication: No difficulties  Cognition Arousal/Alertness: Awake/alert Behavior During Therapy: WFL for tasks assessed/performed Overall Cognitive Status: Within Functional Limits for tasks assessed                                        General Comments      Exercises     Assessment/Plan    PT Assessment Patient needs continued PT services  PT Problem List Decreased strength;Decreased mobility;Decreased safety awareness;Decreased coordination;Decreased activity tolerance;Cardiopulmonary status limiting activity;Decreased balance       PT Treatment Interventions DME instruction;Therapeutic activities;Gait training;Therapeutic exercise;Patient/family education;Stair training;Balance training;Functional mobility training;Neuromuscular re-education    PT Goals (Current goals can be found in the Care Plan section)  Acute Rehab PT Goals Patient Stated Goal: to get stronger, work with PT at her facility  PT Goal Formulation: With patient Time For Goal Achievement: 12/24/17 Potential to Achieve Goals: Fair    Frequency Min 3X/week   Barriers to discharge        Co-evaluation               AM-PAC PT "6 Clicks" Daily Activity  Outcome Measure Difficulty turning over in bed (including adjusting bedclothes, sheets and blankets)?: None Difficulty moving from lying on back to sitting on the side of the bed? : None Difficulty sitting down on and standing up from a chair with arms (e.g., wheelchair, bedside commode, etc,.)?: None Help needed moving to and from a bed to chair (including a wheelchair)?: A Little Help needed walking in hospital room?: A Little Help needed climbing 3-5 steps with a railing? : A Lot 6 Click Score: 20    End of Session Equipment Utilized During Treatment: Gait belt Activity Tolerance: Patient tolerated treatment well Patient left: in bed;with call bell/phone within reach   PT Visit  Diagnosis: Unsteadiness on feet (R26.81);History of falling (Z91.81);Muscle weakness (generalized) (M62.81);Difficulty in walking, not elsewhere classified (R26.2)    Time: 1345-1406 PT Time Calculation (min) (ACUTE ONLY): 21 min   Charges:   PT Evaluation $PT Eval Moderate Complexity: 1 Mod     PT G Codes:        Deniece Ree PT, DPT, CBIS  Supplemental Physical Therapist [place]   Pager [phone]

## 2017-12-10 NOTE — Progress Notes (Signed)
PROGRESS NOTE  [name]  NAT:[id number] DOB: 17-Jun-1922 DOA: 12/06/2017 PCP: Deland Pretty, MD  Outpatient Specialists: Cardiology, Claiborne Billings Brief Narrative: [name] is a 84 y.o. female with a history of PAF on coumadin, valvular heart disease, iron deficiency anemia, HTN and HLD who presented to the ED from ILD after a fall in the setting of a month of increasing debility and dark stools. Hemoglobin was found to be 4 (from baseline ~11) with positive FOBT and INR of 3.27.   Assessment & Plan: Principal Problem:   Severe anemia Active Problems:   Moderate to severe pulmonary hypertension (HCC)   Dyspnea on exertion   Valvular heart disease   NSTEMI (non-ST elevated myocardial infarction) (HCC)   PAF (paroxysmal atrial fibrillation) (HCC)   Chronic anticoagulation, with coumadin, with PAF and CHADS2Vasc2 score of 4   Anemia, iron deficiency   Long term current use of anticoagulant therapy   CAD (coronary artery disease)   Chronic GI bleeding  Acute blood loss anemia on chronic iron deficiency anemia due to chronic GI bleeding: EGD revealed hiatal hernia without any source of bleeding. Colonoscopy felt to be too high risk - Hgb improved from 4 to 9-10 stable with no further bleeding noted.  - Holding coumadin, after discussion with Dr. Claiborne Billings and the patient with her family, the plan is to discontinue coumadin.  - Advance to soft today. Repeat H/H in AM. - Empiric PPI  Acute hypoxia due to acute pulmonary edema: With multiple PRBC transfusions. She has diuresed effectively and is actually -~600cc for the hospitalization and has no crackles on exam.  - Wean oxygen as able.  - Measure I/O, daily weights.   PAF: INR mildly supratherapeutic on admission at 3.27.  - With increasing frailty/fall risk, severe anemia and presumed cryptogenic GI bleeding source, will DC anticoagulation.  - Continue diltiazem, coreg for rate control  Type II NSTEMI: No history of obstructive  CAD and no chest pain. Troponin elevation without ischemic ECG changes in the setting of severe anemia most consistent with demand ischemia.  - Continue statin, beta blocker.   Valvular heart disease: Echo repeated as inpatient, see below. ?if advancing dz is contributing to debility.  - Follow up with Dr. Claiborne Billings as outpatient  Hypokalemia:  - Replaced.   DVT prophylaxis: SCDs Code Status: Partial Family Communication: Son at bedside Disposition Plan: Increasing debility reported by pt. Will get PT/OT evaluations. Pt strongly prefers to stay at [facility] if possible. If weans from oxygen in next 24 hours, can DC 4/24.   Consultants:   GI, Dr. Watt Climes  Cardiology, Dr. Claiborne Billings  Procedures:  Echocardiogram 4/22:  - Left ventricle: The cavity size was normal. Wall thickness was   increased in a pattern of mild LVH. Systolic function was normal.   The estimated ejection fraction was in the range of 60% to 65%.   Wall motion was normal; there were no regional wall motion   abnormalities. - Aortic valve: There was mild stenosis. Valve area (VTI): 0.66   cm^2. Valve area (Vmax): 0.56 cm^2. Valve area (Vmean): 0.55   cm^2. - Mitral valve: There was moderate regurgitation. Valve area by   continuity equation (using LVOT flow): 1.46 cm^2. - Left atrium: The atrium was severely dilated. - Right atrium: The atrium was mildly dilated. - Tricuspid valve: There was moderate regurgitation. - Pulmonary arteries: Systolic pressure was moderately to severely   increased. PA peak pressure: 65 mm Hg (S).  EGD by Dr. Watt Climes  12/10/2017: Findings:      The larynx was normal.      A medium-sized hiatal hernia was present.      One benign-appearing, intrinsic mild stenosis was found. The stenosis       was traversed.      The entire examined stomach was normal.      The duodenal bulb, first portion of the duodenum and second portion of       the duodenum were normal.      The exam was otherwise  without abnormality. Impression:               - Normal larynx.                           - Medium-sized hiatal hernia.                           - Benign-appearing esophageal stenosis.                           - Normal stomach.                           - Normal duodenal bulb, first portion of the                            duodenum and second portion of the duodenum.                           - The examination was otherwise normal.                           - No specimens collected. Recommendation:           - Patient has a contact number available for                            emergencies. The signs and symptoms of potential                            delayed complications were discussed with the                            patient. Return to normal activities tomorrow.                            Written discharge instructions were provided to the                            patient.                           - Soft diet today.                           - Continue present medications.                           - Return  to GI clinic PRN.                           - Telephone GI clinic if symptomatic PRN.   Antimicrobials:  None   Subjective: Feeling chipper currently. Ate soft diet after EGD, had regular brown BM last night. Dyspnea is improved. No chest pain. Has not been getting out of bed much at all.   Objective: Vitals:   12/10/17 0830 12/10/17 0838 12/10/17 1039 12/10/17 1153  BP: (!) 98/55 (!) 104/53 107/83 105/87  Pulse:  (!) 125 (!) 114 (!) 123  Resp: 20 20 14    Temp:    (!) 97.3 F (36.3 C)  TempSrc:    Oral  SpO2: 99% 99%  97%  Weight:      Height:        Intake/Output Summary (Last 24 hours) at 12/10/2017 1239 Last data filed at 12/10/2017 0900 Gross per 24 hour  Intake 600 ml  Output 1100 ml  Net -500 ml   Filed Weights   12/09/17 0701 12/10/17 0601 12/10/17 0704  Weight: 52.7 kg (116 lb 2.9 oz) 48.9 kg (107 lb 14.4 oz) 48.5 kg (107 lb)    Gen: Thin,  pleasant, elderly female in no distress Pulm: Non-labored breathing 2L O2. No crackles or wheezes.  CV: Irreg irreg. No murmur, rub, or gallop. No JVD, no significant pedal edema. GI: Abdomen soft, non-tender, non-distended, with normoactive bowel sounds. No organomegaly or masses felt. Ext: Warm, no deformities Skin: No rashes, lesions or ulcers Neuro: Alert and oriented. No focal neurological deficits. Psych: Judgement and insight appear normal. Mood & affect appropriate.   Data Reviewed: I have personally reviewed following labs and imaging studies  CBC: Recent Labs  Lab 12/06/17 1052  12/08/17 0136  12/08/17 1716 12/09/17 0226 12/09/17 1741 12/10/17 0224 12/10/17 0910  WBC 10.8*  --  10.8*  --   --  8.0  --  7.6  --   NEUTROABS 9.0*  --  8.1*  --   --   --   --   --   --   HGB 4.0*   < > 9.5*  9.6*   < > 9.4* 9.7* 9.1* 9.6* 10.0*  HCT 13.8*   < > 28.9*  29.0*   < > 29.2* 30.1* 29.2* 29.9* 32.3*  MCV 79.8  --  81.6  --   --  83.4  --  85.4  --   PLT 258  --  196  --   --  222  --  237  --    < > = values in this interval not displayed.   Basic Metabolic Panel: Recent Labs  Lab 12/06/17 1052 12/07/17 0217 12/08/17 0136 12/09/17 0226 12/10/17 0224  NA 139 141 139 136 137  K 3.8 3.3* 4.3 3.1* 4.9  CL 106 107 102 102 103  CO2 23 24 27 26 25   GLUCOSE 116* 87 101* 117* 114*  BUN 52* 36* 22* 24* 26*  CREATININE 0.84 0.68 0.73 0.66 0.72  CALCIUM 8.9 8.5* 8.6* 8.1* 8.6*   GFR: Estimated Creatinine Clearance: 33.6 mL/min (by C-G formula based on SCr of 0.72 mg/dL). Liver Function Tests: Recent Labs  Lab 12/06/17 1052 12/09/17 0226  AST 32 22  ALT 15 15  ALKPHOS 40 43  BILITOT 0.6 0.8  PROT 6.1* 5.5*  ALBUMIN 3.2* 2.6*   No results for input(s): LIPASE, AMYLASE in the last 168 hours. No  results for input(s): AMMONIA in the last 168 hours. Coagulation Profile: Recent Labs  Lab 12/06/17 1052 12/08/17 0136 12/10/17 0224  INR 3.27 2.38 1.31   Cardiac  Enzymes: Recent Labs  Lab 12/06/17 1052 12/06/17 1158  CKTOTAL 113  --   TROPONINI  --  0.24*   BNP (last 3 results) No results for input(s): PROBNP in the last 8760 hours. HbA1C: No results for input(s): HGBA1C in the last 72 hours. CBG: No results for input(s): GLUCAP in the last 168 hours. Lipid Profile: No results for input(s): CHOL, HDL, LDLCALC, TRIG, CHOLHDL, LDLDIRECT in the last 72 hours. Thyroid Function Tests: No results for input(s): TSH, T4TOTAL, FREET4, T3FREE, THYROIDAB in the last 72 hours. Anemia Panel: No results for input(s): VITAMINB12, FOLATE, FERRITIN, TIBC, IRON, RETICCTPCT in the last 72 hours. Urine analysis:    Component Value Date/Time   COLORURINE YELLOW 02/13/2017 1229   APPEARANCEUR CLEAR 02/13/2017 1229   LABSPEC 1.013 02/13/2017 1229   PHURINE 7.0 02/13/2017 1229   GLUCOSEU NEGATIVE 02/13/2017 1229   HGBUR NEGATIVE 02/13/2017 1229   BILIRUBINUR NEGATIVE 02/13/2017 1229   KETONESUR NEGATIVE 02/13/2017 1229   PROTEINUR NEGATIVE 02/13/2017 1229   UROBILINOGEN 0.2 05/09/2015 1038   NITRITE NEGATIVE 02/13/2017 1229   LEUKOCYTESUR NEGATIVE 02/13/2017 1229   No results found for this or any previous visit (from the past 240 hour(s)).    Radiology Studies: No results found.  Scheduled Meds: . atorvastatin  10 mg Oral QHS  . calcium-vitamin D  1 tablet Oral Daily  . carvedilol  3.125 mg Oral BID  . cholecalciferol  1,000 Units Oral Daily  . colestipol  1 g Oral TID  . cycloSPORINE  1 drop Both Eyes BID  . diltiazem  120 mg Oral Daily  . feeding supplement  1 Container Oral TID BM  . fluticasone  2 spray Each Nare BID  . gabapentin  300 mg Oral BID  . gabapentin  900 mg Oral QHS  . loratadine  10 mg Oral Daily  . pantoprazole  40 mg Oral Daily  . phytonadione  2 mg Oral Once  . sodium chloride flush  3 mL Intravenous Q12H   Continuous Infusions:   LOS: 4 days   Time spent: 25 minutes.  Vance Gather, MD [facility] Pager  [phone]  If 7PM-7AM, please contact night-coverage [email or web address] Password [facility] 12/10/2017, 12:39 PM

## 2017-12-11 ENCOUNTER — Ambulatory Visit: Payer: Medicare Other | Admitting: Podiatry

## 2017-12-11 ENCOUNTER — Encounter (HOSPITAL_COMMUNITY): Payer: Self-pay | Admitting: Gastroenterology

## 2017-12-11 DIAGNOSIS — D649 Anemia, unspecified: Secondary | ICD-10-CM

## 2017-12-11 DIAGNOSIS — I4891 Unspecified atrial fibrillation: Secondary | ICD-10-CM

## 2017-12-11 LAB — BASIC METABOLIC PANEL
Anion gap: 11 (ref 5–15)
BUN: 28 mg/dL — ABNORMAL HIGH (ref 6–20)
CO2: 24 mmol/L (ref 22–32)
Calcium: 8.7 mg/dL — ABNORMAL LOW (ref 8.9–10.3)
Chloride: 103 mmol/L (ref 101–111)
Creatinine, Ser: 0.81 mg/dL (ref 0.44–1.00)
GFR calc Af Amer: >60 mL/min (ref >60)
GFR calc non Af Amer: >60 mL/min (ref >60)
Glucose, Bld: 100 mg/dL — ABNORMAL HIGH (ref 65–99)
Potassium: 4.6 mmol/L (ref 3.5–5.1)
Sodium: 138 mmol/L (ref 135–145)

## 2017-12-11 LAB — MRSA PCR SCREENING: MRSA by PCR: NEGATIVE

## 2017-12-11 LAB — HEMOGLOBIN AND HEMATOCRIT, BLOOD
HCT: 30.8 % — ABNORMAL LOW (ref 36.0–46.0)
Hemoglobin: 9.6 g/dL — ABNORMAL LOW (ref 12.0–15.0)

## 2017-12-11 LAB — TSH: TSH: 0.65 u[IU]/mL (ref 0.350–4.500)

## 2017-12-11 MED ORDER — METOPROLOL TARTRATE 25 MG PO TABS
25.0000 mg | ORAL_TABLET | Freq: Three times a day (TID) | ORAL | Status: DC
  Administered 2017-12-11 – 2017-12-15 (×11): 25 mg via ORAL
  Filled 2017-12-11 (×12): qty 1

## 2017-12-11 MED ORDER — ALUM & MAG HYDROXIDE-SIMETH 200-200-20 MG/5ML PO SUSP
30.0000 mL | ORAL | Status: DC | PRN
  Administered 2017-12-11 – 2017-12-17 (×2): 30 mL via ORAL
  Filled 2017-12-11 (×2): qty 30

## 2017-12-11 MED ORDER — DILTIAZEM HCL-DEXTROSE 100-5 MG/100ML-% IV SOLN (PREMIX)
5.0000 mg/h | INTRAVENOUS | Status: DC
  Administered 2017-12-11 – 2017-12-13 (×4): 5 mg/h via INTRAVENOUS
  Filled 2017-12-11 (×4): qty 100

## 2017-12-11 MED ORDER — DILTIAZEM HCL ER COATED BEADS 180 MG PO CP24
180.0000 mg | ORAL_CAPSULE | Freq: Every day | ORAL | Status: DC
  Administered 2017-12-11: 180 mg via ORAL
  Filled 2017-12-11: qty 1

## 2017-12-11 NOTE — Progress Notes (Signed)
Report called and given to SDU nurse Neta Mends RN 4:24 PM 12-11-2017

## 2017-12-11 NOTE — Progress Notes (Signed)
PROGRESS NOTE  [name]  XLK:[id number] DOB: October 25, 1921 DOA: 12/06/2017 PCP: Deland Pretty, MD  Outpatient Specialists: Cardiology, Claiborne Billings Brief Narrative: [name] is a 84 y.o. female with a history of PAF on coumadin, valvular heart disease, iron deficiency anemia, HTN and HLD who presented to the ED from ILD after a fall in the setting of a month of increasing debility and dark stools. Hemoglobin was found to be 4 (from baseline ~11) with positive FOBT and INR of 3.27. Transfusions and vitamin K were provided and EGD ultimately EGD performed 4/23 showing no evidence or source of bleeding. Blood counts had stabilized, though she required diuresis after transfusions. After discussion with the family, GI and the patient's cardiologist, Dr. Claiborne Billings, the plan was to discharge home 4/24 and discontinue coumadin. She continues to require supplemental oxygen and has now developed AFib w/RVR, so formal cardiology consultation is requested.   Assessment & Plan: Principal Problem:   Severe anemia Active Problems:   Moderate to severe pulmonary hypertension (HCC)   Dyspnea on exertion   Valvular heart disease   NSTEMI (non-ST elevated myocardial infarction) (HCC)   PAF (paroxysmal atrial fibrillation) (HCC)   Chronic anticoagulation, with coumadin, with PAF and CHADS2Vasc2 score of 4   Anemia, iron deficiency   Long term current use of anticoagulant therapy   CAD (coronary artery disease)   Chronic GI bleeding  Acute blood loss anemia on chronic iron deficiency anemia due to chronic GI bleeding: EGD revealed hiatal hernia without any source of bleeding.  - Colonoscopy felt to be too high risk, though pt may follow up with Dr. Watt Climes if bleeding returns and invasive work up is requested.  - Hgb improved from 4 to 9-10 stable with no further bleeding noted.  - Holding coumadin, after discussion with Dr. Claiborne Billings and the patient with her family, the plan has been to discontinue coumadin.    - Tolerating soft diet - Empiric PPI  Acute hypoxia due to acute pulmonary edema: With multiple PRBC transfusions. She has diuresed effectively and is actually negative balance recorded for the hospitalization. - Continued to require oxygen, though CXR shows improving edema - Measure I/O, daily weights.   PAF now with rapid ventricular rate: - With increasing frailty/fall risk, severe anemia and presumed cryptogenic GI bleeding source, will DC anticoagulation. INR mildly supratherapeutic on admission at 3.27.   - Asked RN to give diltiazem and coreg early, modestly increase dose of diltiazem given softer BPs. Continue telemetry monitoring and appreciate cardiology recommendations. If rate not improving, may need transfer to SDU level of care.   Type II NSTEMI: No history of obstructive CAD and no chest pain. Troponin elevation without ischemic ECG changes in the setting of severe anemia most consistent with demand ischemia.  - Continue statin, beta blocker.   Valvular heart disease: Echo repeated as inpatient, see below.  - Per cardiology. Pt of Dr. Evette Georges  Hypokalemia:  - Replaced and at goal of >4.    DVT prophylaxis: SCDs Code Status: Partial Family Communication: None at bedside Disposition Plan: Plan to discharge back to independent living facility with home health once stable.   Consultants:   GI, Dr. Watt Climes  Cardiology, Dr. Claiborne Billings  Procedures:  Echocardiogram 4/22:  - Left ventricle: The cavity size was normal. Wall thickness was   increased in a pattern of mild LVH. Systolic function was normal.   The estimated ejection fraction was in the range of 60% to 65%.   Wall motion was normal;  there were no regional wall motion   abnormalities. - Aortic valve: There was mild stenosis. Valve area (VTI): 0.66   cm^2. Valve area (Vmax): 0.56 cm^2. Valve area (Vmean): 0.55   cm^2. - Mitral valve: There was moderate regurgitation. Valve area by   continuity equation (using LVOT  flow): 1.46 cm^2. - Left atrium: The atrium was severely dilated. - Right atrium: The atrium was mildly dilated. - Tricuspid valve: There was moderate regurgitation. - Pulmonary arteries: Systolic pressure was moderately to severely   increased. PA peak pressure: 65 mm Hg (S).  EGD by Dr. Watt Climes 12/10/2017: Findings:      The larynx was normal.      A medium-sized hiatal hernia was present.      One benign-appearing, intrinsic mild stenosis was found. The stenosis       was traversed.      The entire examined stomach was normal.      The duodenal bulb, first portion of the duodenum and second portion of       the duodenum were normal.      The exam was otherwise without abnormality. Impression:               - Normal larynx.                           - Medium-sized hiatal hernia.                           - Benign-appearing esophageal stenosis.                           - Normal stomach.                           - Normal duodenal bulb, first portion of the                            duodenum and second portion of the duodenum.                           - The examination was otherwise normal.                           - No specimens collected. Recommendation:           - Patient has a contact number available for                            emergencies. The signs and symptoms of potential                            delayed complications were discussed with the                            patient. Return to normal activities tomorrow.                            Written discharge instructions were provided to the  patient.                           - Soft diet today.                           - Continue present medications.                           - Return to GI clinic PRN.                           - Telephone GI clinic if symptomatic PRN.   Antimicrobials:  None   Subjective: Had a very hard time sleeping last night before she got her medications.  Feeling short of breath this morning. No chest pain. No further bleeding or fever.   Objective: Vitals:   12/11/17 0258 12/11/17 0405 12/11/17 0600 12/11/17 0943  BP:    120/85  Pulse: (!) 119 (!) 126  (!) 142  Resp:      Temp:      TempSrc:      SpO2:      Weight:   51.8 kg (114 lb 4.8 oz)   Height:        Intake/Output Summary (Last 24 hours) at 12/11/2017 0959 Last data filed at 12/11/2017 0948 Gross per 24 hour  Intake 243 ml  Output 100 ml  Net 143 ml   Filed Weights   12/10/17 0601 12/10/17 0704 12/11/17 0600  Weight: 48.9 kg (107 lb 14.4 oz) 48.5 kg (107 lb) 51.8 kg (114 lb 4.8 oz)   Gen: Thin, pleasant, elderly female in no distress Pulm: Non-labored breathing 2L O2. No crackles or wheezes.  CV: Rapid irregular. Harsh systolic murmur at base and apex increasing in intensity with prolonged R-R interval. No gallop. No JVD, no significant pedal edema. GI: Abdomen soft, non-tender, non-distended, with normoactive bowel sounds. No organomegaly or masses felt. Ext: Warm, no deformities Skin: No rashes, lesions or ulcers Neuro: Alert and oriented. No focal neurological deficits. Psych: Judgement and insight appear normal. Mood & affect appropriate.   Data Reviewed: I have personally reviewed following labs and imaging studies  CBC: Recent Labs  Lab 12/06/17 1052  12/08/17 0136  12/09/17 0226 12/09/17 1741 12/10/17 0224 12/10/17 0910 12/11/17 0449  WBC 10.8*  --  10.8*  --  8.0  --  7.6  --   --   NEUTROABS 9.0*  --  8.1*  --   --   --   --   --   --   HGB 4.0*   < > 9.5*  9.6*   < > 9.7* 9.1* 9.6* 10.0* 9.6*  HCT 13.8*   < > 28.9*  29.0*   < > 30.1* 29.2* 29.9* 32.3* 30.8*  MCV 79.8  --  81.6  --  83.4  --  85.4  --   --   PLT 258  --  196  --  222  --  237  --   --    < > = values in this interval not displayed.   Basic Metabolic Panel: Recent Labs  Lab 12/07/17 0217 12/08/17 0136 12/09/17 0226 12/10/17 0224 12/11/17 0449  NA 141 139 136 137 138  K  3.3* 4.3 3.1* 4.9 4.6  CL 107 102 102 103 103  CO2 24 27 26  25 24  GLUCOSE 87 101* 117* 114* 100*  BUN 36* 22* 24* 26* 28*  CREATININE 0.68 0.73 0.66 0.72 0.81  CALCIUM 8.5* 8.6* 8.1* 8.6* 8.7*   GFR: Estimated Creatinine Clearance: 35.5 mL/min (by C-G formula based on SCr of 0.81 mg/dL). Liver Function Tests: Recent Labs  Lab 12/06/17 1052 12/09/17 0226  AST 32 22  ALT 15 15  ALKPHOS 40 43  BILITOT 0.6 0.8  PROT 6.1* 5.5*  ALBUMIN 3.2* 2.6*   No results for input(s): LIPASE, AMYLASE in the last 168 hours. No results for input(s): AMMONIA in the last 168 hours. Coagulation Profile: Recent Labs  Lab 12/06/17 1052 12/08/17 0136 12/10/17 0224  INR 3.27 2.38 1.31   Cardiac Enzymes: Recent Labs  Lab 12/06/17 1052 12/06/17 1158  CKTOTAL 113  --   TROPONINI  --  0.24*   BNP (last 3 results) No results for input(s): PROBNP in the last 8760 hours. HbA1C: No results for input(s): HGBA1C in the last 72 hours. CBG: No results for input(s): GLUCAP in the last 168 hours. Lipid Profile: No results for input(s): CHOL, HDL, LDLCALC, TRIG, CHOLHDL, LDLDIRECT in the last 72 hours. Thyroid Function Tests: No results for input(s): TSH, T4TOTAL, FREET4, T3FREE, THYROIDAB in the last 72 hours. Anemia Panel: No results for input(s): VITAMINB12, FOLATE, FERRITIN, TIBC, IRON, RETICCTPCT in the last 72 hours. Urine analysis:    Component Value Date/Time   COLORURINE YELLOW 02/13/2017 1229   APPEARANCEUR CLEAR 02/13/2017 1229   LABSPEC 1.013 02/13/2017 1229   PHURINE 7.0 02/13/2017 1229   GLUCOSEU NEGATIVE 02/13/2017 1229   HGBUR NEGATIVE 02/13/2017 1229   BILIRUBINUR NEGATIVE 02/13/2017 1229   KETONESUR NEGATIVE 02/13/2017 1229   PROTEINUR NEGATIVE 02/13/2017 1229   UROBILINOGEN 0.2 05/09/2015 1038   NITRITE NEGATIVE 02/13/2017 1229   LEUKOCYTESUR NEGATIVE 02/13/2017 1229   No results found for this or any previous visit (from the past 240 hour(s)).    Radiology  Studies: Dg Chest Port 1 View  Result Date: 12/10/2017 CLINICAL DATA:  Acute respiratory failure EXAM: PORTABLE CHEST 1 VIEW COMPARISON:  Portable exam 1637 hours compared to 12/07/2017 FINDINGS: Enlargement of cardiac silhouette with mitral annular calcification. Mediastinal contours and pulmonary vascularity normal. Atherosclerotic calcification aorta. Improved pulmonary edema since previous exam. Mild peribronchial thickening. No gross pleural effusion or pneumothorax. Bones demineralized. IMPRESSION: Enlargement of cardiac silhouette. Bronchitic changes with improved pulmonary edema since 12/07/2017. Electronically Signed   By: Lavonia Dana M.D.   On: 12/10/2017 17:18    Scheduled Meds: . atorvastatin  10 mg Oral QHS  . calcium-vitamin D  1 tablet Oral Daily  . carvedilol  3.125 mg Oral BID  . cholecalciferol  1,000 Units Oral Daily  . colestipol  1 g Oral TID  . cycloSPORINE  1 drop Both Eyes BID  . diltiazem  180 mg Oral Daily  . feeding supplement  1 Container Oral TID BM  . fluticasone  2 spray Each Nare BID  . gabapentin  300 mg Oral BID  . gabapentin  900 mg Oral QHS  . loratadine  10 mg Oral Daily  . multivitamin with minerals  1 tablet Oral Daily  . pantoprazole  40 mg Oral Daily  . phytonadione  2 mg Oral Once  . sodium chloride flush  3 mL Intravenous Q12H   Continuous Infusions:   LOS: 5 days   Time spent: 35 minutes.  Vance Gather, MD [facility] Pager [phone]  If 7PM-7AM, please contact night-coverage [email or web address] Password  [email or web address] 12/11/2017, 9:59 AM

## 2017-12-11 NOTE — Progress Notes (Signed)
[name] 9:28 AM  Subjective: Patient without any complaints and no post EGD problems and no signs of further blood loss in good spirits  Objective: Vital signs stable afebrile no acute distress abdomen is soft nontender hemoglobin stable  Assessment: GI blood loss on Coumadin  Plan: Happy see back in the office when necessary and happy to proceed with further GI workup if signs of bleeding continue otherwise family is anxious to hear from Dr. Ellouise Newer regarding her Coumadin  [facility]  Pager [phone] After 5PM or if no answer call [phone]

## 2017-12-11 NOTE — Progress Notes (Signed)
OT Cancellation Note  Patient Details Name: [name] MRN: [id number] DOB: 09/18/1921   Cancelled Treatment:    Reason Eval/Treat Not Completed: Patient not medically ready;Medical issues which prohibited therapy. Pt with significantly elevated HR this am and now transferring to step down unit. Discussed with PT and pt not appropriate for OT evaluation this date. Will check back next date.   Norman Herrlich, MS OTR/L  Pager: [phone]   Norman Herrlich 12/11/2017, 2:49 PM

## 2017-12-11 NOTE — Progress Notes (Signed)
PT Cancellation Note  Patient Details Name: [name] MRN: [id number] DOB: Aug 12, 1922   Cancelled Treatment:    Reason Eval/Treat Not Completed: Medical issues which prohibited therapy. After discussion with RN, patient not appropriate for therapy today secondary to HR elevation. Patient being transferred to stepdown unit. Will follow up.   Ellamae Sia, PT, DPT Acute Rehabilitation Services  Pager: [phone] 12/11/2017, 2:12 PM
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
## 2017-12-12 DIAGNOSIS — D62 Acute posthemorrhagic anemia: Secondary | ICD-10-CM

## 2017-12-12 DIAGNOSIS — I480 Paroxysmal atrial fibrillation: Secondary | ICD-10-CM

## 2017-12-12 DIAGNOSIS — I272 Pulmonary hypertension, unspecified: Secondary | ICD-10-CM

## 2017-12-12 DIAGNOSIS — R0609 Other forms of dyspnea: Secondary | ICD-10-CM

## 2017-12-12 DIAGNOSIS — I38 Endocarditis, valve unspecified: Secondary | ICD-10-CM

## 2017-12-12 MED ORDER — DIGOXIN 125 MCG PO TABS
0.2500 mg | ORAL_TABLET | Freq: Once | ORAL | Status: AC
Start: 2017-12-12 — End: 2017-12-12
  Administered 2017-12-12: 0.25 mg via ORAL
  Filled 2017-12-12: qty 2

## 2017-12-12 MED ORDER — DIGOXIN 125 MCG PO TABS
0.0625 mg | ORAL_TABLET | Freq: Every day | ORAL | Status: DC
  Administered 2017-12-13 – 2017-12-15 (×3): 0.0625 mg via ORAL
  Filled 2017-12-12 (×3): qty 1

## 2017-12-12 NOTE — Progress Notes (Signed)
Physical Therapy Treatment Patient Details Name: [name] MRN: [id number] DOB: [birth year] Today's Date: 12/12/2017    History of Present Illness 84yo female admitted after a fall and 1 month of black stools, SOB. Diagnosed with severe iron deficient anemia secondary to GI bleed, dypsnea, and elevated troponins. PMH anemia, anxiety, OA, HTN, hx L foot fracture treated with surgery, memory disorder, neuropathy, PAF, hx cardiac cath, TKR, hernia repair     PT Comments    Patient received sitting on bedside commode with RN present; she was able to perform self-care with S, and required S-Min guard today for functional transfers from sitting and to pivot back to recliner. HR remains extremely high at rest especially in the context of her age, and this PT session was limited by HR elevation today. Performed basic therapeutic exercises for B LEs in recliner today with rest breaks provided PRN and as needed per fluctuating HR elevations. Unable to progress mobility or gait today due to safety concerns secondary to elevated HR. She states "I want to walk badly!" but is agreeable and voices understanding of education related to safety concerns today, and was left up in the chair with all needs met this afternoon.    Follow Up Recommendations  Home health PT;Other (comment)(would benefit from HHPT at her independent living facility if medically appropriate/cleared )     Equipment Recommendations  3in1 (PT)    Recommendations for Other Services       Precautions / Restrictions Precautions Precautions: Fall Precaution Comments: watch SpO2 and HR  Restrictions Weight Bearing Restrictions: No    Mobility  Bed Mobility               General bed mobility comments: DNT, patient received in chair   Transfers Overall transfer level: Needs assistance Equipment used: None Transfers: Sit to/from Stand;Stand Pivot Transfers Sit to Stand: Supervision Stand pivot transfers: Min guard        General transfer comment: no difficulty with functional transfers today, S-min guard and assist for line management provided for safety   Ambulation/Gait             General Gait Details: DNT due to HR fluctuations between 107 to 152BPM today    Stairs             Wheelchair Mobility    Modified Rankin (Stroke Patients Only)       Balance Overall balance assessment: Needs assistance;History of Falls Sitting-balance support: Bilateral upper extremity supported;Feet supported Sitting balance-Leahy Scale: Good Sitting balance - Comments: no LOB donning socks     Standing balance-Leahy Scale: Fair Standing balance comment: can release walker standing at sink to wash hands                            Cognition Arousal/Alertness: Awake/alert Behavior During Therapy: WFL for tasks assessed/performed Overall Cognitive Status: Within Functional Limits for tasks assessed                                        Exercises      General Comments        Pertinent Vitals/Pain Pain Assessment: No/denies pain    Home Living Family/patient expects to be discharged to:: Private residence Living Arrangements: Alone Available Help at Discharge: Family;Available PRN/intermittently Type of Home: Independent living facility([facility]) Home Access: Level entry  Home Layout: One level Home Equipment: [place] - 4 wheels;Cane - single point;Wheelchair - power;Shower seat;Grab bars - tub/shower;Hand held shower head      Prior Function Level of Independence: Independent;Independent with assistive device(s)      Comments: can perform self care, mostly uses electric WC due to MD recommendation secondary to concerns over HR/A-fib    PT Goals (current goals can now be found in the care plan section) Acute Rehab PT Goals Patient Stated Goal: to go home with assist of outside caregiver as needed PT Goal Formulation: With patient Time For  Goal Achievement: 12/24/17 Potential to Achieve Goals: Fair Progress towards PT goals: Progressing toward goals    Frequency    Min 3X/week      PT Plan Current plan remains appropriate    Co-evaluation              AM-PAC PT "6 Clicks" Daily Activity  Outcome Measure  Difficulty turning over in bed (including adjusting bedclothes, sheets and blankets)?: None Difficulty moving from lying on back to sitting on the side of the bed? : None Difficulty sitting down on and standing up from a chair with arms (e.g., wheelchair, bedside commode, etc,.)?: None Help needed moving to and from a bed to chair (including a wheelchair)?: A Little Help needed walking in hospital room?: A Little Help needed climbing 3-5 steps with a railing? : A Lot 6 Click Score: 20    End of Session   Activity Tolerance: Treatment limited secondary to medical complications (Comment) Patient left: in chair;with call bell/phone within reach   PT Visit Diagnosis: Unsteadiness on feet (R26.81);History of falling (Z91.81);Muscle weakness (generalized) (M62.81);Difficulty in walking, not elsewhere classified (R26.2)     Time: 9357-0177 PT Time Calculation (min) (ACUTE ONLY): 15 min  Charges:  $Therapeutic Exercise: 8-22 mins                    G Codes:       Deniece Ree PT, DPT, CBIS  Supplemental Physical Therapist [place]   Pager [phone]

## 2017-12-12 NOTE — Care Management Important Message (Signed)
Important Message  Patient Details  Name: [name] MRN: [id number] Date of Birth: Aug 07, 1922   Medicare Important Message Given:  Yes    Shawnette Augello 12/12/2017, 1:12 PM

## 2017-12-12 NOTE — Progress Notes (Signed)
Error

## 2017-12-12 NOTE — Progress Notes (Addendum)
Progress Note  Patient Name: [name] Date of Encounter: 12/12/2017  Primary Cardiologist: Shelva Majestic, MD   Subjective   Feeling ok today. Son currently by bedside. She can feel her afib (palpitaitions). She also notes exertional dyspnea over the last 6 weeks but no orthopnea or PND (sleeps with only 1 pillow). She also denies weight gain. She has had gradual weight loss over the past several months. No edema.  Inpatient Medications    Scheduled Meds: . atorvastatin  10 mg Oral QHS  . calcium-vitamin D  1 tablet Oral Daily  . cholecalciferol  1,000 Units Oral Daily  . colestipol  1 g Oral TID  . cycloSPORINE  1 drop Both Eyes BID  . feeding supplement  1 Container Oral TID BM  . fluticasone  2 spray Each Nare BID  . gabapentin  300 mg Oral BID  . gabapentin  900 mg Oral QHS  . loratadine  10 mg Oral Daily  . metoprolol tartrate  25 mg Oral TID  . multivitamin with minerals  1 tablet Oral Daily  . pantoprazole  40 mg Oral Daily  . phytonadione  2 mg Oral Once  . sodium chloride flush  3 mL Intravenous Q12H   Continuous Infusions: . diltiazem (CARDIZEM) infusion 5 mg/hr (12/12/17 0917)   PRN Meds: acetaminophen, alum & mag hydroxide-simeth, ondansetron **OR** ondansetron (ZOFRAN) IV   Vital Signs    Vitals:   12/12/17 0913 12/12/17 1100 12/12/17 1518 12/12/17 1632  BP: 107/88 109/77 101/76 99/85  Pulse: (!) 116 (!) 122 (!) 126 (!) 125  Resp:  (!) 21  19  Temp:  98.2 F (36.8 C)  98.2 F (36.8 C)  TempSrc:  Oral  Oral  SpO2:  92%  94%  Weight:      Height:        Intake/Output Summary (Last 24 hours) at 12/12/2017 1706 Last data filed at 12/12/2017 1657 Gross per 24 hour  Intake 324.58 ml  Output 900 ml  Net -575.42 ml   Filed Weights   12/10/17 0704 12/11/17 0600 12/12/17 0415  Weight: 107 lb (48.5 kg) 114 lb 4.8 oz (51.8 kg) 109 lb 2 oz (49.5 kg)    Telemetry    Atrial fibrillation in the 110s-120s  - Personally Reviewed  ECG      12/08/17 atrial fibrillation 134 bpm - Personally Reviewed  Physical Exam   GEN: No acute distress.  Pleasant. Elderly and frail. Neck: No JVD Cardiac: irreguarlly irregular, tachy rate, 2/6 murmur near a[pex Respiratory: Clear to auscultation bilaterally. GI: Soft, nontender, non-distended  MS: No edema; No deformity. Neuro:  Nonfocal  Psych: Normal affect   Labs    Chemistry Recent Labs  Lab 12/06/17 1052  12/09/17 0226 12/10/17 0224 12/11/17 0449  NA 139   < > 136 137 138  K 3.8   < > 3.1* 4.9 4.6  CL 106   < > 102 103 103  CO2 23   < > 26 25 24   GLUCOSE 116*   < > 117* 114* 100*  BUN 52*   < > 24* 26* 28*  CREATININE 0.84   < > 0.66 0.72 0.81  CALCIUM 8.9   < > 8.1* 8.6* 8.7*  PROT 6.1*  --  5.5*  --   --   ALBUMIN 3.2*  --  2.6*  --   --   AST 32  --  22  --   --   ALT 15  --  15  --   --   ALKPHOS 40  --  43  --   --   BILITOT 0.6  --  0.8  --   --   GFRNONAA 58*   < > >60 >60 >60  GFRAA >60   < > >60 >60 >60  ANIONGAP 10   < > 8 9 11    < > = values in this interval not displayed.     Hematology Recent Labs  Lab 12/08/17 0136  12/09/17 0226  12/10/17 0224 12/10/17 0910 12/11/17 0449  WBC 10.8*  --  8.0  --  7.6  --   --   RBC 3.54*  --  3.61*  --  3.50*  --   --   HGB 9.5*  9.6*   < > 9.7*   < > 9.6* 10.0* 9.6*  HCT 28.9*  29.0*   < > 30.1*   < > 29.9* 32.3* 30.8*  MCV 81.6  --  83.4  --  85.4  --   --   MCH 26.8  --  26.9  --  27.4  --   --   MCHC 32.9  --  32.2  --  32.1  --   --   RDW 17.0*  --  17.3*  --  18.1*  --   --   PLT 196  --  222  --  237  --   --    < > = values in this interval not displayed.    Cardiac Enzymes Recent Labs  Lab 12/06/17 1158  TROPONINI 0.24*    Recent Labs  Lab 12/06/17 1059  TROPIPOC 0.27*     BNP Recent Labs  Lab 12/07/17 0944 12/08/17 0136 12/09/17 0226  BNP 953.0* 856.5* 933.3*     DDimer No results for input(s): DDIMER in the last 168 hours.   Radiology    No results found.  Cardiac  Studies   Echo 12/09/17: Study Conclusions - Left ventricle: The cavity size was normal. Wall thickness was increased in a pattern of mild LVH. Systolic function was normal. The estimated ejection fraction was in the range of 60% to 65%. Wall motion was normal; there were no regional wall motion abnormalities. - Aortic valve: There was mild stenosis. Valve area (VTI): 0.66 cm^2. Valve area (Vmax): 0.56 cm^2. Valve area (Vmean): 0.55 cm^2. - Mitral valve: There was moderate regurgitation. Valve area by continuity equation (using LVOT flow): 1.46 cm^2. - Left atrium: The atrium was severely dilated. - Right atrium: The atrium was mildly dilated. - Tricuspid valve: There was moderate regurgitation. - Pulmonary arteries: Systolic pressure was moderately to severely increased. PA peak pressure: 65 mm Hg (S).    Patient Profile     84 y.o. female with history of PAF (on coumadin prior to admit), mitral regurgitation, mod AS, CV dz, HTN, HL and nonobstructive CAD who was admitted with SOB and significant anemia with Hgb of 4 (INR 3.27) and later developed afib with RVR, for which cardiology was consulted.  . Assessment & Plan    1. Atrial Fibrillation w/ RVR: in the setting of marked anemia with initial Hgb of 4. INR was supra therapeutic at 3.27 on admit. Coumadin held and Vitamin K given. S/p transfusion of PRBC x 3 units w/ improvement in Hgb, now at 9.6. EGD without active bleeding, colonoscopy felt to be too high risk and was deferred. Given her advanced age and anemia, would recommended permanent discontinuation of anticoagulation. In regards  to rate control, her BB was changed 4/24 from Coreg to metoprolol. She is also on IV Dilt, low rate 5 mg//hr. Pressures have been soft, limiting further titration of rate control agents. Renal function is ok. SCr 0.81. She may benefit from addition of digoxin for additional rate control. Will defer to MD.   Also of note, TSH WNL. K  WNL. Echo 12/09/17 shows normal LVEF and mild AS. The LA is severely dilated. Moderate MR noted. Pulmonary artery pressure was moderately to severely elevated, at 65 mm Hg.   Dr. Claiborne Billings to see today and will provide further recs.   For questions or updates, please contact [place] Please consult [email or web address] for contact info under Cardiology/STEMI.      Signed, Lyda Jester, PA-C  12/12/2017, 5:06 PM    Patient seen and examined. Agree with assessment and plan.  [name] is a 84 year old patient who is well-known to me and have been taking care of for many years I have reviewed her recent hospitalization records.. 2 days ago I spoke with Dr. Bonner Puna concerning risk for future anticoagulation.  I had a long discussion with both she and her son today.  Presently she is back in atrial fibrillation with ventricular rate ranging from 100 to 125 bpm on Cardizem drip at 5 mg/h.  She was also started on metoprolol 25 mg every 8 hours and blood pressure is low limiting further titration of medication.  I have recommended initiation of digoxin in the attempt to allow for additional rate control.  With her heart rate now in the 120s I will give her a 0.25 mg digoxin dose this evening and then start her on 0.0625 mg daily.  I was going to start her on IV dose this evening but since she is relatively asymptomatic despite her increased heart rate I will utilize oral dosing.  I reviewed her EGD findings which showed a medium sized hiatal hernia with no active blood loss.  Her source of GI blood loss is still undetermined and she has not had evaluation of her small bowel or large intestine.  It is my recommendation that Coumadin not be restarted.  Although there is potential stroke risk with PAF, she does have at least moderate mitral regurgitation and as result most likely has less stasis of left atrial flow due to the extent of MR back into her left atrium.  At present, we will continue low-dose IV  diltiazem and beta-blocker therapy.  I will check iron studies and consider initiation of iron supplementation.  She has received 3 units of packed red blood cells during her hospitalization.  If she develops further drop in hemoglobin, would recommend colonoscopy with possible small bowel evaluation if negative.   Troy Sine, MD, [facility] 12/12/2017 6:20 PM

## 2017-12-12 NOTE — Progress Notes (Signed)
PROGRESS NOTE  [name]  FUX:[id number] DOB: [birth year] DOA: 12/06/2017 PCP: Deland Pretty, MD  Outpatient Specialists: Cardiology, Claiborne Billings Brief Narrative: [name] is a 84 y.o. female with a history of PAF on coumadin, valvular heart disease, iron deficiency anemia, HTN and HLD who presented to the ED from [facility] after a fall in the setting of a month of increasing debility and dark stools. Hemoglobin was found to be 4 (from baseline ~11) with positive FOBT and INR of 3.27. Transfusions and vitamin K were provided and EGD ultimately EGD performed 4/23 showing no evidence or source of bleeding. Blood counts had stabilized, though she required diuresis after transfusions. After discussion with the family, GI and the patient's cardiologist, Dr. Claiborne Billings, the plan was to discharge home 4/24 and discontinue coumadin. She continues to require supplemental oxygen and has now developed AFib w/RVR, so formal cardiology consultation was requested. She has continued to require diltiazem infusion.  Assessment & Plan: Principal Problem:   Severe anemia Active Problems:   Moderate to severe pulmonary hypertension (HCC)   Dyspnea on exertion   Valvular heart disease   NSTEMI (non-ST elevated myocardial infarction) (HCC)   PAF (paroxysmal atrial fibrillation) (HCC)   Chronic anticoagulation, with coumadin, with PAF and CHADS2Vasc2 score of 4   Anemia, iron deficiency   Long term current use of anticoagulant therapy   CAD (coronary artery disease)   Chronic GI bleeding  Acute blood loss anemia on chronic iron deficiency anemia due to chronic GI bleeding: EGD revealed hiatal hernia without any source of bleeding.  - Colonoscopy felt to be too high risk, though pt may follow up with Dr. Watt Climes if bleeding returns and invasive work up is requested.  - Hgb improved from 4 to 9-10 stable with no further bleeding noted.  - Holding coumadin, after discussion with Dr. Claiborne Billings and the patient with her  family, the plan has been to discontinue coumadin.  - Tolerating soft diet - Empiric PPI - Recheck CBC in AM  Acute hypoxia due to acute pulmonary edema: With multiple PRBC transfusions. She has diuresed effectively and is actually negative balance recorded for the hospitalization. - Continued to require oxygen, though CXR shows improving edema - Measure I/O, daily weights.   PAF now with rapid ventricular rate: - With increasing frailty/fall risk, severe anemia and presumed cryptogenic GI bleeding source, will DC anticoagulation. INR mildly supratherapeutic on admission at 3.27.   - Cardiology consulted for assistance, continuing diltiazem infusion and changed coreg to metoprolol. Pressure is soft with this.   - Monitor K, Mg - TSH 0.650  Type II NSTEMI: No history of obstructive CAD and no chest pain. Troponin elevation without ischemic ECG changes in the setting of severe anemia most consistent with demand ischemia.  - Continue statin, beta blocker.   Valvular heart disease: Echo repeated as inpatient, see below.  - Per cardiology. Pt of Dr. Evette Georges  Hypokalemia:  - Replaced and at goal of >4. Recheck BMP in AM.     DVT prophylaxis: SCDs Code Status: Partial Family Communication: None at bedside Disposition Plan: Plan to discharge back to independent living facility with home health once stable. Continue SDU.   Consultants:   GI, Dr. Watt Climes  Cardiology, Dr. Claiborne Billings  Procedures:  Echocardiogram 4/22:  - Left ventricle: The cavity size was normal. Wall thickness was   increased in a pattern of mild LVH. Systolic function was normal.   The estimated ejection fraction was in the range of 60% to  65%.   Wall motion was normal; there were no regional wall motion   abnormalities. - Aortic valve: There was mild stenosis. Valve area (VTI): 0.66   cm^2. Valve area (Vmax): 0.56 cm^2. Valve area (Vmean): 0.55   cm^2. - Mitral valve: There was moderate regurgitation. Valve area by    continuity equation (using LVOT flow): 1.46 cm^2. - Left atrium: The atrium was severely dilated. - Right atrium: The atrium was mildly dilated. - Tricuspid valve: There was moderate regurgitation. - Pulmonary arteries: Systolic pressure was moderately to severely   increased. PA peak pressure: 65 mm Hg (S).  EGD by Dr. Watt Climes 12/10/2017: Findings:      The larynx was normal.      A medium-sized hiatal hernia was present.      One benign-appearing, intrinsic mild stenosis was found. The stenosis       was traversed.      The entire examined stomach was normal.      The duodenal bulb, first portion of the duodenum and second portion of       the duodenum were normal.      The exam was otherwise without abnormality. Impression:               - Normal larynx.                           - Medium-sized hiatal hernia.                           - Benign-appearing esophageal stenosis.                           - Normal stomach.                           - Normal duodenal bulb, first portion of the                            duodenum and second portion of the duodenum.                           - The examination was otherwise normal.                           - No specimens collected. Recommendation:           - Patient has a contact number available for                            emergencies. The signs and symptoms of potential                            delayed complications were discussed with the                            patient. Return to normal activities tomorrow.                            Written discharge instructions were provided to the  patient.                           - Soft diet today.                           - Continue present medications.                           - Return to GI clinic PRN.                           - Telephone GI clinic if symptomatic PRN.   Antimicrobials:  None   Subjective: Breathing more poorly than baseline, at  times can feel heart beat rapidly. No chest pain currently. No bleeding, fever, cough, or dysuria.  Objective: Vitals:   12/12/17 0730 12/12/17 0913 12/12/17 1100 12/12/17 1518  BP: 97/66 107/88 109/77 101/76  Pulse: (!) 104 (!) 116 (!) 122 (!) 126  Resp: 18  (!) 21   Temp: 98.1 F (36.7 C)  98.2 F (36.8 C)   TempSrc: Oral  Oral   SpO2: 95%  92%   Weight:      Height:        Intake/Output Summary (Last 24 hours) at 12/12/2017 1557 Last data filed at 12/12/2017 1200 Gross per 24 hour  Intake 324.58 ml  Output 600 ml  Net -275.42 ml   Filed Weights   12/10/17 0704 12/11/17 0600 12/12/17 0415  Weight: 48.5 kg (107 lb) 51.8 kg (114 lb 4.8 oz) 49.5 kg (109 lb 2 oz)   Gen: Thin, pleasant, elderly female in no distress Pulm: Non-labored breathing 2L O2. No crackles or wheezes.  CV: Rapid irregular. Harsh systolic murmur at base and apex increasing in intensity with prolonged R-R interval. No gallop. No JVD. Trace bilateral pedal edema GI: Abdomen soft, non-tender, non-distended, with normoactive bowel sounds. No organomegaly or masses felt. Ext: Warm, no deformities Skin: No rashes, lesions or ulcers Neuro: Alert and oriented. No focal neurological deficits. Psych: Judgement and insight appear normal. Mood & affect appropriate.   Data Reviewed: I have personally reviewed following labs and imaging studies  CBC: Recent Labs  Lab 12/06/17 1052  12/08/17 0136  12/09/17 0226 12/09/17 1741 12/10/17 0224 12/10/17 0910 12/11/17 0449  WBC 10.8*  --  10.8*  --  8.0  --  7.6  --   --   NEUTROABS 9.0*  --  8.1*  --   --   --   --   --   --   HGB 4.0*   < > 9.5*  9.6*   < > 9.7* 9.1* 9.6* 10.0* 9.6*  HCT 13.8*   < > 28.9*  29.0*   < > 30.1* 29.2* 29.9* 32.3* 30.8*  MCV 79.8  --  81.6  --  83.4  --  85.4  --   --   PLT 258  --  196  --  222  --  237  --   --    < > = values in this interval not displayed.   Basic Metabolic Panel: Recent Labs  Lab 12/07/17 0217 12/08/17 0136  12/09/17 0226 12/10/17 0224 12/11/17 0449  NA 141 139 136 137 138  K 3.3* 4.3 3.1* 4.9 4.6  CL 107 102 102 103 103  CO2 24 27 26 25  24  GLUCOSE 87 101* 117* 114* 100*  BUN 36* 22* 24* 26* 28*  CREATININE 0.68 0.73 0.66 0.72 0.81  CALCIUM 8.5* 8.6* 8.1* 8.6* 8.7*   GFR: Estimated Creatinine Clearance: 33.9 mL/min (by C-G formula based on SCr of 0.81 mg/dL). Liver Function Tests: Recent Labs  Lab 12/06/17 1052 12/09/17 0226  AST 32 22  ALT 15 15  ALKPHOS 40 43  BILITOT 0.6 0.8  PROT 6.1* 5.5*  ALBUMIN 3.2* 2.6*   No results for input(s): LIPASE, AMYLASE in the last 168 hours. No results for input(s): AMMONIA in the last 168 hours. Coagulation Profile: Recent Labs  Lab 12/06/17 1052 12/08/17 0136 12/10/17 0224  INR 3.27 2.38 1.31   Cardiac Enzymes: Recent Labs  Lab 12/06/17 1052 12/06/17 1158  CKTOTAL 113  --   TROPONINI  --  0.24*   BNP (last 3 results) No results for input(s): PROBNP in the last 8760 hours. HbA1C: No results for input(s): HGBA1C in the last 72 hours. CBG: No results for input(s): GLUCAP in the last 168 hours. Lipid Profile: No results for input(s): CHOL, HDL, LDLCALC, TRIG, CHOLHDL, LDLDIRECT in the last 72 hours. Thyroid Function Tests: Recent Labs    12/11/17 1520  TSH 0.650   Anemia Panel: No results for input(s): VITAMINB12, FOLATE, FERRITIN, TIBC, IRON, RETICCTPCT in the last 72 hours. Urine analysis:    Component Value Date/Time   COLORURINE YELLOW 02/13/2017 1229   APPEARANCEUR CLEAR 02/13/2017 1229   LABSPEC 1.013 02/13/2017 1229   PHURINE 7.0 02/13/2017 1229   GLUCOSEU NEGATIVE 02/13/2017 1229   HGBUR NEGATIVE 02/13/2017 1229   BILIRUBINUR NEGATIVE 02/13/2017 1229   KETONESUR NEGATIVE 02/13/2017 1229   PROTEINUR NEGATIVE 02/13/2017 1229   UROBILINOGEN 0.2 05/09/2015 1038   NITRITE NEGATIVE 02/13/2017 [place] 02/13/2017 1229   Recent Results (from the past 240 hour(s))  MRSA PCR Screening      Status: None   Collection Time: 12/11/17  5:04 PM  Result Value Ref Range Status   MRSA by PCR NEGATIVE NEGATIVE Final    Comment:        The GeneXpert MRSA Assay (FDA approved for NASAL specimens only), is one component of a comprehensive MRSA colonization surveillance program. It is not intended to diagnose MRSA infection nor to guide or monitor treatment for MRSA infections. Performed at [facility], [place] [address], [place], [place] [address]       Radiology Studies: Dg Chest Port 1 View  Result Date: 12/10/2017 CLINICAL DATA:  Acute respiratory failure EXAM: PORTABLE CHEST 1 VIEW COMPARISON:  Portable exam 1637 hours compared to 12/07/2017 FINDINGS: Enlargement of cardiac silhouette with mitral annular calcification. Mediastinal contours and pulmonary vascularity normal. Atherosclerotic calcification aorta. Improved pulmonary edema since previous exam. Mild peribronchial thickening. No gross pleural effusion or pneumothorax. Bones demineralized. IMPRESSION: Enlargement of cardiac silhouette. Bronchitic changes with improved pulmonary edema since 12/07/2017. Electronically Signed   By: Lavonia Dana M.D.   On: 12/10/2017 17:18    Scheduled Meds: . atorvastatin  10 mg Oral QHS  . calcium-vitamin D  1 tablet Oral Daily  . cholecalciferol  1,000 Units Oral Daily  . colestipol  1 g Oral TID  . cycloSPORINE  1 drop Both Eyes BID  . feeding supplement  1 Container Oral TID BM  . fluticasone  2 spray Each Nare BID  . gabapentin  300 mg Oral BID  . gabapentin  900 mg Oral QHS  . loratadine  10 mg Oral  Daily  . metoprolol tartrate  25 mg Oral TID  . multivitamin with minerals  1 tablet Oral Daily  . pantoprazole  40 mg Oral Daily  . phytonadione  2 mg Oral Once  . sodium chloride flush  3 mL Intravenous Q12H   Continuous Infusions: . diltiazem (CARDIZEM) infusion 5 mg/hr (12/12/17 0917)     LOS: 6 days   Time spent: 35 minutes.  Patrecia Pour, MD [facility] Pager [phone]  If 7PM-7AM, please contact night-coverage [email or web address] Password [facility] 12/12/2017, 3:57 PM

## 2017-12-12 NOTE — Evaluation (Signed)
Occupational Therapy Evaluation Patient Details Name: [name] MRN: [id number] DOB: 04/22/1922 Today's Date: 12/12/2017    History of Present Illness 84yo female admitted after a fall and 1 month of black stools, SOB. Diagnosed with severe iron deficient anemia secondary to GI bleed, dypsnea, and elevated troponins. PMH anemia, anxiety, OA, HTN, hx L foot fracture treated with surgery, memory disorder, neuropathy, PAF, hx cardiac cath, TKR, hernia repair    Clinical Impression   Pt is overall functioning at a supervision level with extra time due to elevated HR (142 non sustained with activity) and need for frequent rest breaks. Educated in energy conservation strategies with pt verbalizing understanding. Pt wants to return to her ILF and hire a caregiver to assist her. No further OT needs.    Follow Up Recommendations  No OT follow up    Equipment Recommendations  None recommended by OT    Recommendations for Other Services       Precautions / Restrictions Precautions Precautions: Fall Precaution Comments: watch SpO2 and HR  Restrictions Weight Bearing Restrictions: No      Mobility Bed Mobility               General bed mobility comments: pt received in chair  Transfers Overall transfer level: Needs assistance Equipment used: Rolling walker (2 wheeled) Transfers: Sit to/from Stand Sit to Stand: Supervision         General transfer comment: no difficulty from chair or 3 in 1    Balance Overall balance assessment: Needs assistance;History of Falls   Sitting balance-Leahy Scale: Good Sitting balance - Comments: no LOB donning socks     Standing balance-Leahy Scale: Fair Standing balance comment: can release walker standing at sink to wash hands                           ADL either performed or assessed with clinical judgement   ADL Overall ADL's : At baseline                                       General ADL  Comments: pt primarily limited by elevated HR and poor enduranc     Vision Baseline Vision/History: Wears glasses Wears Glasses: At all times Patient Visual Report: No change from baseline       Perception     Praxis      Pertinent Vitals/Pain Pain Assessment: No/denies pain     Hand Dominance Right   Extremity/Trunk Assessment Upper Extremity Assessment Upper Extremity Assessment: Overall WFL for tasks assessed   Lower Extremity Assessment Lower Extremity Assessment: Defer to PT evaluation   Cervical / Trunk Assessment Cervical / Trunk Assessment: Kyphotic   Communication Communication Communication: No difficulties   Cognition Arousal/Alertness: Awake/alert Behavior During Therapy: WFL for tasks assessed/performed Overall Cognitive Status: Within Functional Limits for tasks assessed                                     General Comments       Exercises     Shoulder Instructions      Home Living Family/patient expects to be discharged to:: Private residence Living Arrangements: Alone Available Help at Discharge: Family;Available PRN/intermittently Type of Home: Independent living facility([facility]) Home Access: Level entry  Home Layout: One level     Bathroom Shower/Tub: Occupational psychologist: Handicapped height     Home Equipment: Environmental consultant - 4 wheels;Cane - single point;Wheelchair - power;Shower seat;Grab bars - tub/shower;Hand held shower head          Prior Functioning/Environment Level of Independence: Independent;Independent with assistive device(s)        Comments: can perform self care, mostly uses electric WC due to MD recommendation secondary to concerns over HR/A-fib         OT Problem List: Cardiopulmonary status limiting activity      OT Treatment/Interventions:      OT Goals(Current goals can be found in the care plan section) Acute Rehab OT Goals Patient Stated Goal: to go home with  assist of outside caregiver as needed  OT Frequency:     Barriers to D/C:            Co-evaluation              AM-PAC PT "6 Clicks" Daily Activity     Outcome Measure Help from another person eating meals?: None Help from another person taking care of personal grooming?: A Little Help from another person toileting, which includes using toliet, bedpan, or urinal?: A Little Help from another person bathing (including washing, rinsing, drying)?: A Little Help from another person to put on and taking off regular upper body clothing?: None Help from another person to put on and taking off regular lower body clothing?: A Little 6 Click Score: 20   End of Session Equipment Utilized During Treatment: Gait belt;Rolling walker;Oxygen  Activity Tolerance: Treatment limited secondary to medical complications (Comment);Patient limited by fatigue(HR) Patient left: in chair;with call bell/phone within reach  OT Visit Diagnosis: Other abnormalities of gait and mobility (R26.89)                Time: 3875-6433 OT Time Calculation (min): 27 min Charges:  OT General Charges $OT Visit: 1 Visit OT Evaluation $OT Eval Moderate Complexity: 1 Mod OT Treatments $Self Care/Home Management : 8-22 mins G-Codes:     12-21-17 Nestor Lewandowsky, OTR/L Pager: [phone]  Werner Lean, Haze Boyden 12/21/2017, 2:16 PM

## 2017-12-13 ENCOUNTER — Inpatient Hospital Stay (HOSPITAL_COMMUNITY): Payer: Medicare Other

## 2017-12-13 LAB — IRON AND TIBC
Iron: 14 ug/dL — ABNORMAL LOW (ref 28–170)
Saturation Ratios: 4 % — ABNORMAL LOW (ref 10.4–31.8)
TIBC: 349 ug/dL (ref 250–450)
UIBC: 335 ug/dL

## 2017-12-13 LAB — CBC
HCT: 30 % — ABNORMAL LOW (ref 36.0–46.0)
Hemoglobin: 9.2 g/dL — ABNORMAL LOW (ref 12.0–15.0)
MCH: 26.6 pg (ref 26.0–34.0)
MCHC: 30.7 g/dL (ref 30.0–36.0)
MCV: 86.7 fL (ref 78.0–100.0)
Platelets: 271 10*3/uL (ref 150–400)
RBC: 3.46 MIL/uL — ABNORMAL LOW (ref 3.87–5.11)
RDW: 17.9 % — ABNORMAL HIGH (ref 11.5–15.5)
WBC: 8 10*3/uL (ref 4.0–10.5)

## 2017-12-13 LAB — BASIC METABOLIC PANEL
Anion gap: 8 (ref 5–15)
BUN: 29 mg/dL — ABNORMAL HIGH (ref 6–20)
CO2: 27 mmol/L (ref 22–32)
Calcium: 8.9 mg/dL (ref 8.9–10.3)
Chloride: 103 mmol/L (ref 101–111)
Creatinine, Ser: 0.83 mg/dL (ref 0.44–1.00)
GFR calc Af Amer: >60 mL/min (ref >60)
GFR calc non Af Amer: 59 mL/min — ABNORMAL LOW (ref >60)
Glucose, Bld: 108 mg/dL — ABNORMAL HIGH (ref 65–99)
Potassium: 5.1 mmol/L (ref 3.5–5.1)
Sodium: 138 mmol/L (ref 135–145)

## 2017-12-13 LAB — FERRITIN: Ferritin: 22 ng/mL (ref 11–307)

## 2017-12-13 MED ORDER — DIGOXIN 125 MCG PO TABS
0.1250 mg | ORAL_TABLET | Freq: Once | ORAL | Status: AC
  Administered 2017-12-13: 0.125 mg via ORAL
  Filled 2017-12-13: qty 1

## 2017-12-13 NOTE — Progress Notes (Signed)
PROGRESS NOTE    [name]  ZDG:[id number] DOB: 1921-09-07 DOA: 12/06/2017 PCP: Deland Pretty, MD    Brief Narrative: [name] is a 84 y.o. female with a history of PAF on coumadin, valvular heart disease, iron deficiency anemia, HTN and HLD who presented to the ED from ILD after a fall in the setting of a month of increasing debility and dark stools. Hemoglobin was found to be 4 (from baseline ~11) with positive FOBT and INR of 3.27. Transfusions and vitamin K were provided and EGD ultimately EGD performed 4/23 showing no evidence or source of bleeding. Blood counts had stabilized, though she required diuresis after transfusions. After discussion with the family, GI and the patient's cardiologist, Dr. Claiborne Billings, the plan was to discharge home 4/24 and discontinue coumadin. She continues to require supplemental oxygen and has now developed AFib w/RVR, so formal cardiology consultation was requested. She has continued to require diltiazem infusion.  Assessment & Plan:   Principal Problem:   Severe anemia Active Problems:   Moderate to severe pulmonary hypertension (HCC)   Dyspnea on exertion   Valvular heart disease   NSTEMI (non-ST elevated myocardial infarction) (HCC)   PAF (paroxysmal atrial fibrillation) (HCC)   Chronic anticoagulation, with coumadin, with PAF and CHADS2Vasc2 score of 4   Anemia, iron deficiency   Long term current use of anticoagulant therapy   CAD (coronary artery disease)   Chronic GI bleeding   Anemia due to acute blood loss   Acute blood loss anemia on chronic iron deficiency anemia due to chronic GI bleeding: EGD revealed hiatal hernia without any source of bleeding.  - Colonoscopy felt to be too high risk, though pt may follow up with Dr. Watt Climes if bleeding returns and invasive work up is requested.  - Hgb improved from 4 to 9-10 stable with.  Stable.  S/p 3 units pRBC. - Holding coumadin, after discussion with Dr. Claiborne Billings and the patient with her  family, the plan has been to discontinue coumadin.  - Tolerating soft diet - Empiric PPI - Recheck CBC in AM  Acute hypoxia due to acute pulmonary edema: With multiple PRBC transfusions. She has diuresed effectively and is actually negative balance recorded for the hospitalization. - Continued to require oxygen, though CXR shows improving edema - Measure I/O, daily weights - wean O2 as tolerated - looks like last dose of lasix was 4/23, will repeat CXR   Wt Readings from Last 3 Encounters:  12/12/17 49.5 kg (109 lb 2 oz)  11/08/17 52.9 kg (116 lb 9.6 oz)  10/21/17 52.2 kg (115 lb)     PAF now with rapid ventricular rate: - With increasing frailty/fall risk, severe anemia and presumed cryptogenic GI bleeding source, will DC anticoagulation. INR mildly supratherapeutic on admission at 3.27.   - Dilt gtt, metop 25 q8.  Started on digoxin 0.0625 daily by cardiology. - Cardiology appreciate cards recs - Monitor K, Mg - TSH 0.650  Type II NSTEMI: No history of obstructive CAD and no chest pain. Troponin elevation without ischemic ECG changes in the setting of severe anemia most consistent with demand ischemia.  - Continue statin, beta blocker.   Valvular heart disease: Echo repeated as inpatient, see below.  Normal EF.  Mild AV stenosis.  Moderate MV regurgitation.  Elevated PASP. - Per cardiology. Pt of Dr. Evette Georges  Hypokalemia:  - Replaced and at goal of >4. Recheck BMP in AM.     DVT prophylaxis: SCD Code Status: partial (CPR, no intubation)  Family Communication: none at bedside Disposition Plan: pending improvement   Consultants:   Cardiology  Gastroenterology  Procedures:  4/22 Echo Study Conclusions  - Left ventricle: The cavity size was normal. Wall thickness was   increased in a pattern of mild LVH. Systolic function was normal.   The estimated ejection fraction was in the range of 60% to 65%.   Wall motion was normal; there were no regional wall motion    abnormalities. - Aortic valve: There was mild stenosis. Valve area (VTI): 0.66   cm^2. Valve area (Vmax): 0.56 cm^2. Valve area (Vmean): 0.55   cm^2. - Mitral valve: There was moderate regurgitation. Valve area by   continuity equation (using LVOT flow): 1.46 cm^2. - Left atrium: The atrium was severely dilated. - Right atrium: The atrium was mildly dilated. - Tricuspid valve: There was moderate regurgitation. - Pulmonary arteries: Systolic pressure was moderately to severely   increased. PA peak pressure: 65 mm Hg (S  EGD by Dr. Watt Climes 12/10/2017: Findings: The larynx was normal. A medium-sized hiatal hernia was present. One benign-appearing, intrinsic mild stenosis was found. The stenosis  was traversed. The entire examined stomach was normal. The duodenal bulb, first portion of the duodenum and second portion of  the duodenum were normal. The exam was otherwise without abnormality. Impression: - Normal larynx. - Medium-sized hiatal hernia. - Benign-appearing esophageal stenosis. - Normal stomach. - Normal duodenal bulb, first portion of the  duodenum and second portion of the duodenum. - The examination was otherwise normal. - No specimens collected. Recommendation: - Patient has a contact number available for  emergencies. The signs and symptoms of potential  delayed complications were discussed with the  patient. Return to normal activities tomorrow.  Written discharge instructions were provided to the  patient. - Soft diet today. - Continue present  medications. - Return to GI clinic PRN. - Telephone GI clinic if symptomatic PRN.   Antimicrobials Anti-infectives (From admission, onward)   None     Subjective: Doing well.  No complaints today. No SOB. No O2 at baseline.   Objective: Vitals:   12/13/17 0348 12/13/17 0600 12/13/17 0814 12/13/17 1322  BP: 95/64 105/70 (!) 119/93 (!) 122/100  Pulse: 100  (!) 106 (!) 58  Resp: 20  18 18   Temp: (!) 97.2 F (36.2 C)  97.7 F (36.5 C) 97.8 F (36.6 C)  TempSrc: Oral  Oral Oral  SpO2: 99%  94% 100%  Weight:      Height:        Intake/Output Summary (Last 24 hours) at 12/13/2017 1526 Last data filed at 12/13/2017 1100 Gross per 24 hour  Intake 310 ml  Output 1200 ml  Net -890 ml   Filed Weights   12/10/17 0704 12/11/17 0600 12/12/17 0415  Weight: 48.5 kg (107 lb) 51.8 kg (114 lb 4.8 oz) 49.5 kg (109 lb 2 oz)    Examination:  General exam: Appears calm and comfortable.  Sitting up in chair.  Respiratory system: Clear to auscultation. Respiratory effort normal.  Crackles at bases.   Cardiovascular system: S1 & S2 heard, RRR. No JVD, murmurs, rubs, gallops or clicks. No pedal edema. Gastrointestinal system: Abdomen is nondistended, soft and nontender. No organomegaly or masses felt. Normal bowel sounds heard. Central nervous system: Alert and oriented. No focal neurological deficits. Extremities: Symmetric 5 x 5 power. Skin: No rashes, lesions or ulcers Psychiatry: Judgement and insight appear normal. Mood & affect appropriate.     Data Reviewed: I have personally  reviewed following labs and imaging studies  CBC: Recent Labs  Lab 12/08/17 0136  12/09/17 0226 12/09/17 1741 12/10/17 0224 12/10/17 0910 12/11/17 0449 12/13/17 0317  WBC 10.8*  --  8.0  --  7.6  --   --  8.0  NEUTROABS 8.1*  --   --   --   --   --   --   --   HGB 9.5*  9.6*   < > 9.7* 9.1* 9.6* 10.0* 9.6* 9.2*  HCT 28.9*  29.0*   < > 30.1*  29.2* 29.9* 32.3* 30.8* 30.0*  MCV 81.6  --  83.4  --  85.4  --   --  86.7  PLT 196  --  222  --  237  --   --  271   < > = values in this interval not displayed.   Basic Metabolic Panel: Recent Labs  Lab 12/08/17 0136 12/09/17 0226 12/10/17 0224 12/11/17 0449 12/13/17 0317  NA 139 136 137 138 138  K 4.3 3.1* 4.9 4.6 5.1  CL 102 102 103 103 103  CO2 27 26 25 24 27   GLUCOSE 101* 117* 114* 100* 108*  BUN 22* 24* 26* 28* 29*  CREATININE 0.73 0.66 0.72 0.81 0.83  CALCIUM 8.6* 8.1* 8.6* 8.7* 8.9   GFR: Estimated Creatinine Clearance: 33.1 mL/min (by C-G formula based on SCr of 0.83 mg/dL). Liver Function Tests: Recent Labs  Lab 12/09/17 0226  AST 22  ALT 15  ALKPHOS 43  BILITOT 0.8  PROT 5.5*  ALBUMIN 2.6*   No results for input(s): LIPASE, AMYLASE in the last 168 hours. No results for input(s): AMMONIA in the last 168 hours. Coagulation Profile: Recent Labs  Lab 12/08/17 0136 12/10/17 0224  INR 2.38 1.31   Cardiac Enzymes: No results for input(s): CKTOTAL, CKMB, CKMBINDEX, TROPONINI in the last 168 hours. BNP (last 3 results) No results for input(s): PROBNP in the last 8760 hours. HbA1C: No results for input(s): HGBA1C in the last 72 hours. CBG: No results for input(s): GLUCAP in the last 168 hours. Lipid Profile: No results for input(s): CHOL, HDL, LDLCALC, TRIG, CHOLHDL, LDLDIRECT in the last 72 hours. Thyroid Function Tests: Recent Labs    12/11/17 1520  TSH 0.650   Anemia Panel: Recent Labs    12/13/17 0317  FERRITIN 22  TIBC 349  IRON 14*   Sepsis Labs: No results for input(s): PROCALCITON, LATICACIDVEN in the last 168 hours.  Recent Results (from the past 240 hour(s))  MRSA PCR Screening     Status: None   Collection Time: 12/11/17  5:04 PM  Result Value Ref Range Status   MRSA by PCR NEGATIVE NEGATIVE Final    Comment:        The GeneXpert MRSA Assay (FDA approved for NASAL specimens only), is one component of a comprehensive MRSA  colonization surveillance program. It is not intended to diagnose MRSA infection nor to guide or monitor treatment for MRSA infections. Performed at [facility], [place] [address] [place], [place] [address]          Radiology Studies: No results found.      Scheduled Meds: . atorvastatin  10 mg Oral QHS  . calcium-vitamin D  1 tablet Oral Daily  . cholecalciferol  1,000 Units Oral Daily  . colestipol  1 g Oral TID  . cycloSPORINE  1 drop Both Eyes BID  . digoxin  0.0625 mg Oral Daily  . feeding supplement  1 Container  Oral TID BM  . fluticasone  2 spray Each Nare BID  . gabapentin  300 mg Oral BID  . gabapentin  900 mg Oral QHS  . loratadine  10 mg Oral Daily  . metoprolol tartrate  25 mg Oral TID  . multivitamin with minerals  1 tablet Oral Daily  . pantoprazole  40 mg Oral Daily  . phytonadione  2 mg Oral Once  . sodium chloride flush  3 mL Intravenous Q12H   Continuous Infusions: . diltiazem (CARDIZEM) infusion 5 mg/hr (12/13/17 0634)     LOS: 7 days    Time spent: over 30 min    Fayrene Helper, MD [facility] Pager [phone]  If 7PM-7AM, please contact night-coverage [email or web address] Password [facility] 12/13/2017, 3:26 PM

## 2017-12-13 NOTE — Progress Notes (Addendum)
Progress Note  Patient Name: [name] Date of Encounter: 12/13/2017  Primary Cardiologist: Shelva Majestic, MD   Subjective   Sitting up in bed.  No chest pain and SOB with exertion.  HR 100 to 115.   Inpatient Medications    Scheduled Meds: . atorvastatin  10 mg Oral QHS  . calcium-vitamin D  1 tablet Oral Daily  . cholecalciferol  1,000 Units Oral Daily  . colestipol  1 g Oral TID  . cycloSPORINE  1 drop Both Eyes BID  . digoxin  0.0625 mg Oral Daily  . feeding supplement  1 Container Oral TID BM  . fluticasone  2 spray Each Nare BID  . gabapentin  300 mg Oral BID  . gabapentin  900 mg Oral QHS  . loratadine  10 mg Oral Daily  . metoprolol tartrate  25 mg Oral TID  . multivitamin with minerals  1 tablet Oral Daily  . pantoprazole  40 mg Oral Daily  . phytonadione  2 mg Oral Once  . sodium chloride flush  3 mL Intravenous Q12H   Continuous Infusions: . diltiazem (CARDIZEM) infusion 5 mg/hr (12/13/17 0634)   PRN Meds: acetaminophen, alum & mag hydroxide-simeth, ondansetron **OR** ondansetron (ZOFRAN) IV   Vital Signs    Vitals:   12/13/17 0348 12/13/17 0600 12/13/17 0814 12/13/17 1322  BP: 95/64 105/70 (!) 119/93 (!) 122/100  Pulse: 100  (!) 106 (!) 58  Resp: 20  18 18   Temp: (!) 97.2 F (36.2 C)  97.7 F (36.5 C) 97.8 F (36.6 C)  TempSrc: Oral  Oral Oral  SpO2: 99%  94% 100%  Weight:      Height:        Intake/Output Summary (Last 24 hours) at 12/13/2017 1608 Last data filed at 12/13/2017 1100 Gross per 24 hour  Intake 305 ml  Output 1200 ml  Net -895 ml   Filed Weights   12/10/17 0704 12/11/17 0600 12/12/17 0415  Weight: 107 lb (48.5 kg) 114 lb 4.8 oz (51.8 kg) 109 lb 2 oz (49.5 kg)    Telemetry    A fib  - Personally Reviewed  ECG    No new - Personally Reviewed  Physical Exam   GEN: No acute distress.   Neck: No JVD Cardiac: irreg irreg, + 7-3/7 systolic murmur, no rubs, or gallops.  Respiratory: Clear to auscultation  bilaterally. GI: Soft, nontender, non-distended  MS: No edema; No deformity. Neuro:  Nonfocal  Psych: Normal affect   Labs    Chemistry Recent Labs  Lab 12/09/17 0226 12/10/17 0224 12/11/17 0449 12/13/17 0317  NA 136 137 138 138  K 3.1* 4.9 4.6 5.1  CL 102 103 103 103  CO2 26 25 24 27   GLUCOSE 117* 114* 100* 108*  BUN 24* 26* 28* 29*  CREATININE 0.66 0.72 0.81 0.83  CALCIUM 8.1* 8.6* 8.7* 8.9  PROT 5.5*  --   --   --   ALBUMIN 2.6*  --   --   --   AST 22  --   --   --   ALT 15  --   --   --   ALKPHOS 43  --   --   --   BILITOT 0.8  --   --   --   GFRNONAA >60 >60 >60 59*  GFRAA >60 >60 >60 >60  ANIONGAP 8 9 11 8      Hematology Recent Labs  Lab 12/09/17 0226  12/10/17 0224 12/10/17  7253 12/11/17 0449 12/13/17 0317  WBC 8.0  --  7.6  --   --  8.0  RBC 3.61*  --  3.50*  --   --  3.46*  HGB 9.7*   < > 9.6* 10.0* 9.6* 9.2*  HCT 30.1*   < > 29.9* 32.3* 30.8* 30.0*  MCV 83.4  --  85.4  --   --  86.7  MCH 26.9  --  27.4  --   --  26.6  MCHC 32.2  --  32.1  --   --  30.7  RDW 17.3*  --  18.1*  --   --  17.9*  PLT 222  --  237  --   --  271   < > = values in this interval not displayed.    Cardiac EnzymesNo results for input(s): TROPONINI in the last 168 hours. No results for input(s): TROPIPOC in the last 168 hours.   BNP Recent Labs  Lab 12/07/17 0944 12/08/17 0136 12/09/17 0226  BNP 953.0* 856.5* 933.3*     DDimer No results for input(s): DDIMER in the last 168 hours.   Radiology    No results found.  Cardiac Studies   Echo 12/09/17: Study Conclusions - Left ventricle: The cavity size was normal. Wall thickness was increased in a pattern of mild LVH. Systolic function was normal. The estimated ejection fraction was in the range of 60% to 65%. Wall motion was normal; there were no regional wall motion abnormalities. - Aortic valve: There was mild stenosis. Valve area (VTI): 0.66 cm^2. Valve area (Vmax): 0.56 cm^2. Valve area (Vmean):  0.55 cm^2. - Mitral valve: There was moderate regurgitation. Valve area by continuity equation (using LVOT flow): 1.46 cm^2. - Left atrium: The atrium was severely dilated. - Right atrium: The atrium was mildly dilated. - Tricuspid valve: There was moderate regurgitation. - Pulmonary arteries: Systolic pressure was moderately to severely increased. PA peak pressure: 65 mm Hg (S).     Patient Profile     84 y.o. female with history of PAF (on coumadin prior to admit), mitral regurgitation, mod AS, CV dz, HTN, HL and nonobstructive CAD who was admitted with SOB and significant anemia with Hgb of 4 (INR 3.27) and later developed afib with RVR     Assessment & Plan    Atrial fib with RVR with marked anemia. Rate improved 93 to 127  --on lopressor 25 mg three times per day.  dilt drip at 5 and lanoxin  -- stop anticoagulation- coumadin --INR on admit 3.27 and Vit K given   Acute anemia Hgb at 4 now 9.2 followed by IM --was transfused multiple PRBCs --EGD with hiatal hernia and no source of bleeding, colonoscopy too high risk.    Acute hypoxia due to acute pulmonary edema.  --she is neg 1785 and wt down from 116 to 109 lbs.  Troponin elevation 0.27 demand ischemia   Moderate to severe MR - monitor   HTN - labile 86/64 to 122/100 difficult to adjust meds.    For questions or updates, please contact [place] Please consult [email or web address] for contact info under Cardiology/STEMI.      Signed, Cecilie Kicks, NP  12/13/2017, 4:08 PM     Patient seen and examined. Agree with assessment and plan. Feels better; still in AF. Rate now 100-115 range on diltiazem drip a 5 mg/h, metoprolol 25 mg tid, and received a dose of digoxin last night at 0.25 mg po, then started on 0.0625 mg daily  today.  Cr. 0.83.  Since not loaded, will give an extra .125 mg tonight and then resume 0.0625 mg daily. Keep off coumadin.  If recurrent decrease in H/H off anticoagulation then would pursue  colonoscopy.  Troy Sine, MD, [facility] 12/13/2017 6:18 PM

## 2017-12-13 NOTE — Care Management Note (Addendum)
Case Management Note  Patient Details  Name: [name] MRN: [id number] Date of Birth: 02-23-1922  Subjective/Objective:  From [facility] [facility], severe anemia, afib, pulmonary htn, nstemi, plan for cardizem drip, not a candidate for [facility]. Per PT eval rec HHPT .  NCM spoke with Crystal at [facility] and she states [facility] services.  NCM spoke with patient and she would like to work with [employer] for [place], [place] faxed HHPT orders to [phone].  If patient needs home oxygen will need another ambulatory saturation done and patient states she would like to use [facility] for home oxygen if she needs it.   4/30 1049- Tomi Bamberger RN, BSN- CSW informed NCM that MD has decided for patient to go to SNF.                   Action/Plan: DC back to [facility] when stable with HHPT.    Expected Discharge Date:                  Expected Discharge Plan:  Home/Self Care  In-House Referral:     Discharge planning Services  CM Consult  Post Acute Care Choice:  Home Health Choice offered to:  Patient  DME Arranged:    DME Agency:     HH Arranged:  PT HH Agency:  Other - See comment([facility] at the facility)  Status of Service:  In process, will continue to follow  If discussed at Long Length of Stay Meetings, dates discussed:    Additional Comments:  Zenon Mayo, RN 12/13/2017, 3:49 PM

## 2017-12-14 DIAGNOSIS — R748 Abnormal levels of other serum enzymes: Secondary | ICD-10-CM

## 2017-12-14 DIAGNOSIS — R778 Other specified abnormalities of plasma proteins: Secondary | ICD-10-CM

## 2017-12-14 DIAGNOSIS — R7989 Other specified abnormal findings of blood chemistry: Secondary | ICD-10-CM

## 2017-12-14 DIAGNOSIS — I4891 Unspecified atrial fibrillation: Secondary | ICD-10-CM

## 2017-12-14 DIAGNOSIS — I2510 Atherosclerotic heart disease of native coronary artery without angina pectoris: Secondary | ICD-10-CM

## 2017-12-14 DIAGNOSIS — Z7901 Long term (current) use of anticoagulants: Secondary | ICD-10-CM

## 2017-12-14 LAB — CBC
HCT: 32.3 % — ABNORMAL LOW (ref 36.0–46.0)
Hemoglobin: 9.8 g/dL — ABNORMAL LOW (ref 12.0–15.0)
MCH: 26.7 pg (ref 26.0–34.0)
MCHC: 30.3 g/dL (ref 30.0–36.0)
MCV: 88 fL (ref 78.0–100.0)
Platelets: 309 10*3/uL (ref 150–400)
RBC: 3.67 MIL/uL — ABNORMAL LOW (ref 3.87–5.11)
RDW: 17.7 % — ABNORMAL HIGH (ref 11.5–15.5)
WBC: 7.1 10*3/uL (ref 4.0–10.5)

## 2017-12-14 LAB — BASIC METABOLIC PANEL
Anion gap: 7 (ref 5–15)
BUN: 21 mg/dL — ABNORMAL HIGH (ref 6–20)
CO2: 28 mmol/L (ref 22–32)
Calcium: 9.1 mg/dL (ref 8.9–10.3)
Chloride: 105 mmol/L (ref 101–111)
Creatinine, Ser: 0.75 mg/dL (ref 0.44–1.00)
GFR calc Af Amer: >60 mL/min (ref >60)
GFR calc non Af Amer: >60 mL/min (ref >60)
Glucose, Bld: 92 mg/dL (ref 65–99)
Potassium: 4.2 mmol/L (ref 3.5–5.1)
Sodium: 140 mmol/L (ref 135–145)

## 2017-12-14 LAB — MAGNESIUM: Magnesium: 1.7 mg/dL (ref 1.7–2.4)

## 2017-12-14 MED ORDER — LOPERAMIDE HCL 2 MG PO CAPS
2.0000 mg | ORAL_CAPSULE | Freq: Every day | ORAL | Status: DC
  Administered 2017-12-14 – 2017-12-17 (×4): 2 mg via ORAL
  Filled 2017-12-14 (×4): qty 1

## 2017-12-14 MED ORDER — FERROUS SULFATE 325 (65 FE) MG PO TABS
325.0000 mg | ORAL_TABLET | Freq: Two times a day (BID) | ORAL | Status: DC
  Administered 2017-12-14 – 2017-12-17 (×7): 325 mg via ORAL
  Filled 2017-12-14 (×7): qty 1

## 2017-12-14 MED ORDER — MAGNESIUM SULFATE IN D5W 1-5 GM/100ML-% IV SOLN
1.0000 g | Freq: Once | INTRAVENOUS | Status: AC
  Administered 2017-12-14: 1 g via INTRAVENOUS
  Filled 2017-12-14: qty 100

## 2017-12-14 MED ORDER — DILTIAZEM HCL 30 MG PO TABS
45.0000 mg | ORAL_TABLET | Freq: Four times a day (QID) | ORAL | Status: DC
  Administered 2017-12-14 (×2): 45 mg via ORAL
  Filled 2017-12-14 (×2): qty 2

## 2017-12-14 NOTE — Progress Notes (Signed)
SATURATION QUALIFICATIONS: (This note is used to comply with regulatory documentation for home oxygen)  Patient Saturations on Room Air at Rest = 97%  Patient Saturations on Room Air while Ambulating = 98%  Patient Saturations on 2 Liters of oxygen while Ambulating = 83%  Please briefly explain why patient needs home oxygen: Patient eventually got  short of breath while ambulating.

## 2017-12-14 NOTE — Progress Notes (Signed)
PROGRESS NOTE    [name]  BSW:[id number] DOB: Jun 11, 1922 DOA: 12/06/2017 PCP: Deland Pretty, MD    Brief Narrative: [name] is [name] 84 y.o. female with Mehul Rudin history of PAF on coumadin, valvular heart disease, iron deficiency anemia, HTN and HLD who presented to the ED from ILD after [place] Art[place] fall in the setting of Earnest Mcgillis month of increasing debility and dark stools. Hemoglobin was found to be 4 (from baseline ~11) with positive FOBT and INR of 3.27. Transfusions and vitamin K were provided and EGD ultimately EGD performed 4/23 showing no evidence or source of bleeding. Blood counts had stabilized, though she required diuresis after transfusions. After discussion with the family, GI and the patient's cardiologist, Dr. Claiborne Billings, the plan was to discharge home 4/24 and discontinue coumadin. She continues to require supplemental oxygen and has now developed AFib w/RVR, so formal cardiology consultation was requested. She has continued to require diltiazem infusion.  Assessment & Plan:   Principal Problem:   Severe anemia Active Problems:   Moderate to severe pulmonary hypertension (HCC)   Dyspnea on exertion   Valvular heart disease   NSTEMI (non-ST elevated myocardial infarction) (HCC)   PAF (paroxysmal atrial fibrillation) (HCC)   Chronic anticoagulation, with coumadin, with PAF and CHADS2Vasc2 score of 4   Anemia, iron deficiency   Long term current use of anticoagulant therapy   CAD (coronary artery disease)   Chronic GI bleeding   Anemia due to acute blood loss   Acute blood loss anemia on chronic iron deficiency anemia due to chronic GI bleeding: EGD revealed hiatal hernia without any source of bleeding.  - Colonoscopy felt to be too high risk, though pt may follow up with Dr. Watt Climes if bleeding returns and invasive work up is requested.  - Hgb improved from 4 to 9-10 stable with.  Stable today.  S/p 3 units pRBC. - Holding coumadin, after discussion with Dr. Claiborne Billings and the patient  with her family, the plan has been to discontinue coumadin.  - Tolerating soft diet - Empiric PPI - Recheck CBC in AM - start on iron   Acute hypoxia due to acute pulmonary edema: With multiple PRBC transfusions. She has diuresed effectively and is actually negative balance recorded for the hospitalization. - CXR yesterday with small L pleural effusion with atelectasis - Able to wean to RA at bedside, will continue to monitor.  Ambulate.  - Measure I/O, daily weights - hold additional lasix for now with labile BP's and stable on RA  Wt Readings from Last 3 Encounters:  12/12/17 49.5 kg (109 lb 2 oz)  11/08/17 52.9 kg (116 lb 9.6 oz)  10/21/17 52.2 kg (115 lb)   PAF now with rapid ventricular rate: - With increasing frailty/fall risk, severe anemia and presumed cryptogenic GI bleeding source, will DC anticoagulation. INR mildly supratherapeutic on admission at 3.27.   - metop 25 q8.  Started on digoxin 0.0625 daily by cardiology (given additional 0.125 dose last night). - Will try to transition to PO dilt today - Cardiology appreciate cards recs - Monitor K, Mg - TSH 0.650  Hypotension: BP's labile, continue to follow with RVR and adjusting meds above  Type II NSTEMI: No history of obstructive CAD and no chest pain. Troponin elevation without ischemic ECG changes in the setting of severe anemia most consistent with demand ischemia.  - Continue statin, beta blocker.   Valvular heart disease: Echo repeated as inpatient, see below.  Normal EF.  Mild AV stenosis.  Moderate MV regurgitation.  Elevated PASP. - Per cardiology. Pt of Dr. Evette Georges  Hypokalemia:  - Replaced and at goal of >4. Recheck BMP in AM.  Follow mag.  Chronic diarrhea: continue home imodium and colestid    DVT prophylaxis: SCD Code Status: partial (CPR, no intubation) Family Communication: none at bedside Disposition Plan: pending improvement   Consultants:   Cardiology  Gastroenterology  Procedures:    4/22 Echo Study Conclusions  - Left ventricle: The cavity size was normal. Wall thickness was   increased in Dana Dorner pattern of mild LVH. Systolic function was normal.   The estimated ejection fraction was in the range of 60% to 65%.   Wall motion was normal; there were no regional wall motion   abnormalities. - Aortic valve: There was mild stenosis. Valve area (VTI): 0.66   cm^2. Valve area (Vmax): 0.56 cm^2. Valve area (Vmean): 0.55   cm^2. - Mitral valve: There was moderate regurgitation. Valve area by   continuity equation (using LVOT flow): 1.46 cm^2. - Left atrium: The atrium was severely dilated. - Right atrium: The atrium was mildly dilated. - Tricuspid valve: There was moderate regurgitation. - Pulmonary arteries: Systolic pressure was moderately to severely   increased. PA peak pressure: 65 mm Hg (S  EGD by Dr. Watt Climes 12/10/2017: Findings: The larynx was normal. Torry Istre medium-sized hiatal hernia was present. One benign-appearing, intrinsic mild stenosis was found. The stenosis  was traversed. The entire examined stomach was normal. The duodenal bulb, first portion of the duodenum and second portion of  the duodenum were normal. The exam was otherwise without abnormality. Impression: - Normal larynx. - Medium-sized hiatal hernia. - Benign-appearing esophageal stenosis. - Normal stomach. - Normal duodenal bulb, first portion of the  duodenum and second portion of the duodenum. - The examination was otherwise normal. - No specimens collected. Recommendation: - Patient has Barret Esquivel contact number available for  emergencies. The signs and symptoms of potential  delayed complications were discussed  with the  patient. Return to normal activities tomorrow.  Written discharge instructions were provided to the  patient. - Soft diet today. - Continue present medications. - Return to GI clinic PRN. - Telephone GI clinic if symptomatic PRN.   Antimicrobials Anti-infectives (From admission, onward)   None     Subjective: Doing well. Some SOB with exertion, but much better. No LH, dizziness.   Objective: Vitals:   12/14/17 0515 12/14/17 0600 12/14/17 0800 12/14/17 0831  BP: 102/63 (!) 99/58 114/68 113/69  Pulse:    70  Resp:    (!) 37  Temp:   97.6 F (36.4 C)   TempSrc:      SpO2: 99% 96% 95% 100%  Weight:      Height:        Intake/Output Summary (Last 24 hours) at 12/14/2017 0912 Last data filed at 12/14/2017 0705 Gross per 24 hour  Intake 605 ml  Output 901 ml  Net -296 ml   Filed Weights   12/10/17 0704 12/11/17 0600 12/12/17 0415  Weight: 48.5 kg (107 lb) 51.8 kg (114 lb 4.8 oz) 49.5 kg (109 lb 2 oz)    Examination:  General: No acute distress. Cardiovascular: Heart sounds show Yalitza Teed regular rate, and rhythm. No gallops or rubs. No murmurs. No JVD. Lungs: Clear to auscultation bilaterally with good air movement. No rales, rhonchi or wheezes. Abdomen: Soft, nontender, nondistended with normal active bowel sounds. No masses. No hepatosplenomegaly. Neurological: Alert and oriented 3. Moves all extremities 4. Cranial nerves  II through XII grossly intact. Skin: Warm and dry. No rashes or lesions. Extremities: No clubbing or cyanosis. No edema. Psychiatric: Mood and affect are normal. Insight and judgment are appropriate.   Data Reviewed: I have personally reviewed following labs and imaging studies  CBC: Recent Labs  Lab 12/08/17 0136  12/09/17 0226  12/10/17 0224 12/10/17 0910  12/11/17 0449 12/13/17 0317 12/14/17 0236  WBC 10.8*  --  8.0  --  7.6  --   --  8.0 7.1  NEUTROABS 8.1*  --   --   --   --   --   --   --   --   HGB 9.5*  9.6*   < > 9.7*   < > 9.6* 10.0* 9.6* 9.2* 9.8*  HCT 28.9*  29.0*   < > 30.1*   < > 29.9* 32.3* 30.8* 30.0* 32.3*  MCV 81.6  --  83.4  --  85.4  --   --  86.7 88.0  PLT 196  --  222  --  237  --   --  271 309   < > = values in this interval not displayed.   Basic Metabolic Panel: Recent Labs  Lab 12/09/17 0226 12/10/17 0224 12/11/17 0449 12/13/17 0317 12/14/17 0236  NA 136 137 138 138 140  K 3.1* 4.9 4.6 5.1 4.2  CL 102 103 103 103 105  CO2 26 25 24 27 28   GLUCOSE 117* 114* 100* 108* 92  BUN 24* 26* 28* 29* 21*  CREATININE 0.66 0.72 0.81 0.83 0.75  CALCIUM 8.1* 8.6* 8.7* 8.9 9.1  MG  --   --   --   --  1.7   GFR: Estimated Creatinine Clearance: 34.3 mL/min (by C-G formula based on SCr of 0.75 mg/dL). Liver Function Tests: Recent Labs  Lab 12/09/17 0226  AST 22  ALT 15  ALKPHOS 43  BILITOT 0.8  PROT 5.5*  ALBUMIN 2.6*   No results for input(s): LIPASE, AMYLASE in the last 168 hours. No results for input(s): AMMONIA in the last 168 hours. Coagulation Profile: Recent Labs  Lab 12/08/17 0136 12/10/17 0224  INR 2.38 1.31   Cardiac Enzymes: No results for input(s): CKTOTAL, CKMB, CKMBINDEX, TROPONINI in the last 168 hours. BNP (last 3 results) No results for input(s): PROBNP in the last 8760 hours. HbA1C: No results for input(s): HGBA1C in the last 72 hours. CBG: No results for input(s): GLUCAP in the last 168 hours. Lipid Profile: No results for input(s): CHOL, HDL, LDLCALC, TRIG, CHOLHDL, LDLDIRECT in the last 72 hours. Thyroid Function Tests: Recent Labs    12/11/17 1520  TSH 0.650   Anemia Panel: Recent Labs    12/13/17 0317  FERRITIN 22  TIBC 349  IRON 14*   Sepsis Labs: No results for input(s): PROCALCITON, LATICACIDVEN in the last 168 hours.  Recent Results (from the past 240  hour(s))  MRSA PCR Screening     Status: None   Collection Time: 12/11/17  5:04 PM  Result Value Ref Range Status   MRSA by PCR NEGATIVE NEGATIVE Final    Comment:        The GeneXpert MRSA Assay (FDA approved for NASAL specimens only), is one component of Fredia [facility]ittenden comprehensive MRSA colonization surveillance program. It is not intended to diagnose MRSA infection nor to guide or monitor treatment for MRSA infections. Performed at [facility], [place] [address] [place], [place] [address]  Radiology Studies: Dg Chest 2 View  Result Date: 12/13/2017 CLINICAL DATA:  Shortness of breath EXAM: CHEST - 2 VIEW COMPARISON:  12/10/2017 chest radiograph FINDINGS: Small left pleural effusion with associated atelectasis. Mild cardiomegaly with calcific aortic atherosclerosis. No pneumothorax. Unchanged diffuse interstitial prominence. IMPRESSION: Small left pleural effusion with associated left basilar atelectasis. Electronically Signed   By: Ulyses Jarred M.D.   On: 12/13/2017 21:58        Scheduled Meds: . atorvastatin  10 mg Oral QHS  . calcium-vitamin D  1 tablet Oral Daily  . cholecalciferol  1,000 Units Oral Daily  . colestipol  1 g Oral TID  . cycloSPORINE  1 drop Both Eyes BID  . digoxin  0.0625 mg Oral Daily  . diltiazem  45 mg Oral Q6H  . feeding supplement  1 Container Oral TID BM  . fluticasone  2 spray Each Nare BID  . gabapentin  300 mg Oral BID  . gabapentin  900 mg Oral QHS  . loratadine  10 mg Oral Daily  . metoprolol tartrate  25 mg Oral TID  . multivitamin with minerals  1 tablet Oral Daily  . pantoprazole  40 mg Oral Daily  . phytonadione  2 mg Oral Once  . sodium chloride flush  3 mL Intravenous Q12H   Continuous Infusions: . diltiazem (CARDIZEM) infusion 5 mg/hr (12/13/17 2049)     LOS: 8 days    Time spent: over 30 min    Fayrene Helper, MD [facility] Pager [phone]  If 7PM-7AM, please contact  night-coverage [email or web address] Password [facility] 12/14/2017, 9:12 AM

## 2017-12-14 NOTE — Plan of Care (Signed)
  Problem: Safety: Goal: Ability to remain free from injury will improve Outcome: Progressing   

## 2017-12-14 NOTE — Progress Notes (Signed)
Progress Note  Patient Name: [name] Date of Encounter: 12/14/2017  Primary Cardiologist: Shelva Majestic, MD   Subjective   Denies any chest pain or SOB  Inpatient Medications    Scheduled Meds: . atorvastatin  10 mg Oral QHS  . calcium-vitamin D  1 tablet Oral Daily  . cholecalciferol  1,000 Units Oral Daily  . colestipol  1 g Oral TID  . cycloSPORINE  1 drop Both Eyes BID  . digoxin  0.0625 mg Oral Daily  . diltiazem  45 mg Oral Q6H  . feeding supplement  1 Container Oral TID BM  . ferrous sulfate  325 mg Oral BID WC  . fluticasone  2 spray Each Nare BID  . gabapentin  300 mg Oral BID  . gabapentin  900 mg Oral QHS  . loperamide  2 mg Oral Q breakfast  . loratadine  10 mg Oral Daily  . metoprolol tartrate  25 mg Oral TID  . multivitamin with minerals  1 tablet Oral Daily  . pantoprazole  40 mg Oral Daily  . phytonadione  2 mg Oral Once  . sodium chloride flush  3 mL Intravenous Q12H   Continuous Infusions: . diltiazem (CARDIZEM) infusion 5 mg/hr (12/13/17 2049)   PRN Meds: acetaminophen, alum & mag hydroxide-simeth, ondansetron **OR** ondansetron (ZOFRAN) IV   Vital Signs    Vitals:   12/14/17 0515 12/14/17 0600 12/14/17 0800 12/14/17 0831  BP: 102/63 (!) 99/58 114/68 113/69  Pulse:    70  Resp:    (!) 37  Temp:   97.6 F (36.4 C)   TempSrc:      SpO2: 99% 96% 95% 100%  Weight:      Height:        Intake/Output Summary (Last 24 hours) at 12/14/2017 1206 Last data filed at 12/14/2017 0705 Gross per 24 hour  Intake 365 ml  Output 601 ml  Net -236 ml   Filed Weights   12/10/17 0704 12/11/17 0600 12/12/17 0415  Weight: 107 lb (48.5 kg) 114 lb 4.8 oz (51.8 kg) 109 lb 2 oz (49.5 kg)    Telemetry    Atrial fibrillation  - Personally Reviewed  ECG    No new EKG to review - Personally Reviewed  Physical Exam   GEN: No acute distress.   Currently on commode so PE not performed  Labs    Chemistry Recent Labs  Lab 12/09/17 0226   12/11/17 0449 12/13/17 0317 12/14/17 0236  NA 136   < > 138 138 140  K 3.1*   < > 4.6 5.1 4.2  CL 102   < > 103 103 105  CO2 26   < > 24 27 28   GLUCOSE 117*   < > 100* 108* 92  BUN 24*   < > 28* 29* 21*  CREATININE 0.66   < > 0.81 0.83 0.75  CALCIUM 8.1*   < > 8.7* 8.9 9.1  PROT 5.5*  --   --   --   --   ALBUMIN 2.6*  --   --   --   --   AST 22  --   --   --   --   ALT 15  --   --   --   --   ALKPHOS 43  --   --   --   --   BILITOT 0.8  --   --   --   --   GFRNONAA >60   < > >  60 59* >60  GFRAA >60   < > >60 >60 >60  ANIONGAP 8   < > 11 8 7    < > = values in this interval not displayed.     Hematology Recent Labs  Lab 12/10/17 0224  12/11/17 0449 12/13/17 0317 12/14/17 0236  WBC 7.6  --   --  8.0 7.1  RBC 3.50*  --   --  3.46* 3.67*  HGB 9.6*   < > 9.6* 9.2* 9.8*  HCT 29.9*   < > 30.8* 30.0* 32.3*  MCV 85.4  --   --  86.7 88.0  MCH 27.4  --   --  26.6 26.7  MCHC 32.1  --   --  30.7 30.3  RDW 18.1*  --   --  17.9* 17.7*  PLT 237  --   --  271 309   < > = values in this interval not displayed.    Cardiac EnzymesNo results for input(s): TROPONINI in the last 168 hours. No results for input(s): TROPIPOC in the last 168 hours.   BNP Recent Labs  Lab 12/08/17 0136 12/09/17 0226  BNP 856.5* 933.3*     DDimer No results for input(s): DDIMER in the last 168 hours.   Radiology    Dg Chest 2 View  Result Date: 12/13/2017 CLINICAL DATA:  Shortness of breath EXAM: CHEST - 2 VIEW COMPARISON:  12/10/2017 chest radiograph FINDINGS: Small left pleural effusion with associated atelectasis. Mild cardiomegaly with calcific aortic atherosclerosis. No pneumothorax. Unchanged diffuse interstitial prominence. IMPRESSION: Small left pleural effusion with associated left basilar atelectasis. Electronically Signed   By: Ulyses Jarred M.D.   On: 12/13/2017 21:58    Cardiac Studies   Echo 12/09/17: Study Conclusions - Left ventricle: The cavity size was normal. Wall thickness  was increased in a pattern of mild LVH. Systolic function was normal. The estimated ejection fraction was in the range of 60% to 65%. Wall motion was normal; there were no regional wall motion abnormalities. - Aortic valve: There was mild stenosis. Valve area (VTI): 0.66 cm^2. Valve area (Vmax): 0.56 cm^2. Valve area (Vmean): 0.55 cm^2. - Mitral valve: There was moderate regurgitation. Valve area by continuity equation (using LVOT flow): 1.46 cm^2. - Left atrium: The atrium was severely dilated. - Right atrium: The atrium was mildly dilated. - Tricuspid valve: There was moderate regurgitation. - Pulmonary arteries: Systolic pressure was moderately to severely increased. PA peak pressure: 65 mm Hg (S).  Patient Profile     84 y.o. female with history of PAF(on coumadin prior to admit), mitral regurgitation, mod AS, CV dz, HTN, HL and nonobstructive CAD who was admitted with SOB andsignificant anemia withHgbof4 (INR 3.27) and later developed afib with RVR   Assessment & Plan    Atrial fib with RVR likely driven by marked anemia. --HR fairly well controlled on tele at times but then goes up to 110-120's with exertion --continue on  lopressor 25 mg TID, Cardizem 45mg  q6hours and digoxin 0.0625mg  daily.  --cannot uptitrate BB or CCB due to hypotension overnight  --coumadin stopped in setting of acute anemia and INR reversed with Vit K given  --INR 1.31 on 4/23  Acute anemia  --Hgb at 4 on admit and now 9.8 --transfused multiple PRBCs --EGD with hiatal hernia and no source of bleeding, colonoscopy too high risk.  --per [facility]   Acute hypoxia due to acute pulmonary edema.  --she is neg 2L and weight is down 7lbs from admit  Troponin  elevation  --0.27/0.24 likely related to demand ischemia in setting of severe anemia and afib with RVR --2D echo with normal LVF and wall motion  Moderate to severe MR - monitor   HTN  --BP well controlled this am at 114/83mmHg  but dropped to 79/36mmHg overnight    For questions or updates, please contact [facility] Please consult [email or web address] for contact info under Cardiology/STEMI.      Signed, Fransico Him, MD  12/14/2017, 12:06 PM

## 2017-12-15 ENCOUNTER — Inpatient Hospital Stay (HOSPITAL_COMMUNITY): Payer: Medicare Other

## 2017-12-15 LAB — BASIC METABOLIC PANEL
Anion gap: 7 (ref 5–15)
BUN: 20 mg/dL (ref 6–20)
CO2: 28 mmol/L (ref 22–32)
Calcium: 8.7 mg/dL — ABNORMAL LOW (ref 8.9–10.3)
Chloride: 103 mmol/L (ref 101–111)
Creatinine, Ser: 0.76 mg/dL (ref 0.44–1.00)
GFR calc Af Amer: >60 mL/min (ref >60)
GFR calc non Af Amer: >60 mL/min (ref >60)
Glucose, Bld: 87 mg/dL (ref 65–99)
Potassium: 4.2 mmol/L (ref 3.5–5.1)
Sodium: 138 mmol/L (ref 135–145)

## 2017-12-15 LAB — CBC WITH DIFFERENTIAL/PLATELET
Basophils Absolute: 0 10*3/uL (ref 0.0–0.1)
Basophils Relative: 1 %
Eosinophils Absolute: 0.4 10*3/uL (ref 0.0–0.7)
Eosinophils Relative: 7 %
HCT: 32.3 % — ABNORMAL LOW (ref 36.0–46.0)
Hemoglobin: 9.8 g/dL — ABNORMAL LOW (ref 12.0–15.0)
Lymphocytes Relative: 23 %
Lymphs Abs: 1.4 10*3/uL (ref 0.7–4.0)
MCH: 26.6 pg (ref 26.0–34.0)
MCHC: 30.3 g/dL (ref 30.0–36.0)
MCV: 87.5 fL (ref 78.0–100.0)
Monocytes Absolute: 0.4 10*3/uL (ref 0.1–1.0)
Monocytes Relative: 7 %
Neutro Abs: 4 10*3/uL (ref 1.7–7.7)
Neutrophils Relative %: 62 %
Platelets: 321 10*3/uL (ref 150–400)
RBC: 3.69 MIL/uL — ABNORMAL LOW (ref 3.87–5.11)
RDW: 17.3 % — ABNORMAL HIGH (ref 11.5–15.5)
WBC: 6.3 10*3/uL (ref 4.0–10.5)

## 2017-12-15 LAB — MAGNESIUM: Magnesium: 1.8 mg/dL (ref 1.7–2.4)

## 2017-12-15 MED ORDER — SODIUM CHLORIDE 0.9 % IV BOLUS
250.0000 mL | Freq: Once | INTRAVENOUS | Status: AC
  Administered 2017-12-15: 250 mL via INTRAVENOUS

## 2017-12-15 MED ORDER — AMIODARONE HCL IN DEXTROSE 360-4.14 MG/200ML-% IV SOLN
30.0000 mg/h | INTRAVENOUS | Status: DC
  Administered 2017-12-16: 30 mg/h via INTRAVENOUS
  Filled 2017-12-15 (×2): qty 200

## 2017-12-15 MED ORDER — AMIODARONE LOAD VIA INFUSION
150.0000 mg | Freq: Once | INTRAVENOUS | Status: AC
Start: 2017-12-15 — End: 2017-12-15
  Administered 2017-12-15: 150 mg via INTRAVENOUS
  Filled 2017-12-15: qty 83.34

## 2017-12-15 MED ORDER — AMIODARONE HCL IN DEXTROSE 360-4.14 MG/200ML-% IV SOLN
60.0000 mg/h | INTRAVENOUS | Status: AC
  Administered 2017-12-15 (×2): 60 mg/h via INTRAVENOUS
  Filled 2017-12-15: qty 200

## 2017-12-15 NOTE — Progress Notes (Signed)
PROGRESS NOTE    [name]  RWE:[id number] DOB: 1921-09-14 DOA: 12/06/2017 PCP: Deland Pretty, MD    Brief Narrative: [name] is [name] 84 y.o. female with [name] history of PAF on coumadin, valvular heart disease, iron deficiency anemia, HTN and HLD who presented to the ED from ILD after [name] fall in the setting of [name] month of increasing debility and dark stools. Hemoglobin was found to be 4 (from baseline ~11) with positive FOBT and INR of 3.27. Transfusions and vitamin K were provided and EGD ultimately EGD performed 4/23 showing no evidence or source of bleeding. Blood counts had stabilized, though she required diuresis after transfusions. After discussion with the family, GI and the patient's cardiologist, Dr. Claiborne Billings, the plan was to discharge home 4/24 and discontinue coumadin. She continues to require supplemental oxygen and has now developed AFib w/RVR, so formal cardiology consultation was requested. She has continued to require diltiazem infusion.  Assessment & Plan:   Principal Problem:   Severe anemia Active Problems:   Moderate to severe pulmonary hypertension (HCC)   Dyspnea on exertion   Valvular heart disease   NSTEMI (non-ST elevated myocardial infarction) (HCC)   PAF (paroxysmal atrial fibrillation) (HCC)   Chronic anticoagulation, with coumadin, with PAF and CHADS2Vasc2 score of 4   Anemia, iron deficiency   Long term current use of anticoagulant therapy   CAD (coronary artery disease)   Chronic GI bleeding   Anemia due to acute blood loss   Elevated troponin   Atrial fibrillation with RVR (HCC)   Acute blood loss anemia on chronic iron deficiency anemia due to chronic GI bleeding: EGD revealed hiatal hernia without any source of bleeding.  - Colonoscopy felt to be too high risk, though pt may follow up with Dr. Watt Climes if bleeding returns and invasive work up is requested.  - Hgb improved from 4 to 9-10 stable with.  Stable.  S/p 3 units pRBC. - Holding  coumadin, after discussion with Dr. Claiborne Billings and the patient with her family, the plan has been to discontinue coumadin.  - Tolerating soft diet - Empiric PPI - Recheck CBC in AM - start on iron   Acute hypoxia due to acute pulmonary edema: With multiple PRBC transfusions. She has diuresed effectively and is actually negative balance recorded for the hospitalization. - Most recent CXR with small L pleural effusion with atelectasis - Measure I/O, daily weights - hold additional lasix for now with labile BP's and stable on RA (was on O2 last night, but able to easily wean to RA on interview - nursing told me she's intermittently desatting with activity, will repeat CXR) - O2 sats documented with pt ambulating yesterday look odd, will discuss with nursing timing to repeat this, but hold off for now as it sounds like she had some RVR after ambulating yesterday  Wt Readings from Last 3 Encounters:  12/12/17 49.5 kg (109 lb 2 oz)  11/08/17 52.9 kg (116 lb 9.6 oz)  10/21/17 52.2 kg (115 lb)   PAF now with rapid ventricular rate: - With increasing frailty/fall risk, severe anemia and presumed cryptogenic GI bleeding source, will DC anticoagulation. INR mildly supratherapeutic on admission at 3.27.   - metop 25 q8.  Started on digoxin 0.0625 daily by cardiology (given additional 0.125 dose last night). - Will try to transition to PO dilt (missed some doses of dilt yesterday due to holding parameters) - ?episode of RVR per discussion with nursing yesterday.  Pt reports she received medicine  which resolved this episode, but I don't see anything recorded.  ?if this may have been her intermittent RVR that she's had throughout this admission which may have been exacerbated after ambulating, but difficult to tell.  - Cardiology appreciate cards recs.  Looks like plan to d/c above and start amiodarone. - Monitor K, Mg - TSH 0.650  Hypotension: BP's labile, consistently drop overnight.  Normal this AM.  continue to follow with RVR and adjusting meds above  Type II NSTEMI: No history of obstructive CAD and no chest pain. Troponin elevation without ischemic ECG changes in the setting of severe anemia most consistent with demand ischemia.  - Continue statin, beta blocker.   Valvular heart disease: Echo repeated as inpatient, see below.  Normal EF.  Mild AV stenosis.  Moderate MV regurgitation.  Elevated PASP. - Per cardiology. Pt of Dr. Evette Georges  Hypokalemia:  - Replaced and at goal of >4. Recheck BMP in AM.  Follow mag.  Chronic diarrhea: continue home imodium and colestid    DVT prophylaxis: SCD Code Status: partial (CPR, no intubation) Family Communication: discussed with son Disposition Plan: pending improvement   Consultants:   Cardiology  Gastroenterology  Procedures:  4/22 Echo Study Conclusions  - Left ventricle: The cavity size was normal. Wall thickness was   increased in [name] pattern of mild LVH. Systolic function was normal.   The estimated ejection fraction was in the range of 60% to 65%.   Wall motion was normal; there were no regional wall motion   abnormalities. - Aortic valve: There was mild stenosis. Valve area (VTI): 0.66   cm^2. Valve area (Vmax): 0.56 cm^2. Valve area (Vmean): 0.55   cm^2. - Mitral valve: There was moderate regurgitation. Valve area by   continuity equation (using LVOT flow): 1.46 cm^2. - Left atrium: The atrium was severely dilated. - Right atrium: The atrium was mildly dilated. - Tricuspid valve: There was moderate regurgitation. - Pulmonary arteries: Systolic pressure was moderately to severely   increased. PA peak pressure: 65 mm Hg (S  EGD by Dr. Watt Climes 12/10/2017: Findings: The larynx was normal. [name] medium-sized hiatal hernia was present. One benign-appearing, intrinsic mild stenosis was found. The stenosis  was traversed. The entire examined stomach was normal. The duodenal bulb, first portion of  the duodenum and second portion of  the duodenum were normal. The exam was otherwise without abnormality. Impression: - Normal larynx. - Medium-sized hiatal hernia. - Benign-appearing esophageal stenosis. - Normal stomach. - Normal duodenal bulb, first portion of the  duodenum and second portion of the duodenum. - The examination was otherwise normal. - No specimens collected. Recommendation: - Patient has Nicolemarie Wooley contact number available for  emergencies. The signs and symptoms of potential  delayed complications were discussed with the  patient. Return to normal activities tomorrow.  Written discharge instructions were provided to the  patient. - Soft diet today. - Continue present medications. - Return to GI clinic PRN. - Telephone GI clinic if symptomatic PRN.   Antimicrobials Anti-infectives (From admission, onward)   None     Subjective: Had episode last night where her HR was fast. She notes she was given something to resolve this, but I don't see anything in the chart.  Objective: Vitals:   12/15/17 0400 12/15/17 0500 12/15/17 0600 12/15/17 0737  BP: 92/70 91/65 90/62  116/72  Pulse:    89  Resp:    18  Temp:    97.8 F (36.6 C)  TempSrc:    Oral  SpO2: 100% 100% 100% 97%  Weight:      Height:        Intake/Output Summary (Last 24 hours) at 12/15/2017 0851 Last data filed at 12/15/2017 0303 Gross per 24 hour  Intake 253.02 ml  Output 200 ml  Net 53.02 ml   Filed Weights   12/10/17 0704 12/11/17 0600 12/12/17 0415  Weight:  48.5 kg (107 lb) 51.8 kg (114 lb 4.8 oz) 49.5 kg (109 lb 2 oz)    Examination:  General: No acute distress.  Frail. Cardiovascular: Heart sounds show Kwamaine Cuppett regular rate, and rhythm. No gallops or rubs. No murmurs. No JVD. Lungs: Clear to auscultation bilaterally with good air movement. No rales, rhonchi or wheezes. Abdomen: Soft, nontender, nondistended with normal active bowel sounds. No masses. No hepatosplenomegaly. Neurological: Alert and oriented. Moves all extremities 4. Cranial nerves II through XII grossly intact. Skin: Warm and dry. No rashes or lesions. Extremities: No clubbing or cyanosis. No edema. Pedal pulses 2+. Psychiatric: Mood and affect are normal. Insight and judgment are appropriate.    Data Reviewed: I have personally reviewed following labs and imaging studies  CBC: Recent Labs  Lab 12/09/17 0226  12/10/17 0224 12/10/17 0910 12/11/17 0449 12/13/17 0317 12/14/17 0236 12/15/17 0217  WBC 8.0  --  7.6  --   --  8.0 7.1 6.3  NEUTROABS  --   --   --   --   --   --   --  4.0  HGB 9.7*   < > 9.6* 10.0* 9.6* 9.2* 9.8* 9.8*  HCT 30.1*   < > 29.9* 32.3* 30.8* 30.0* 32.3* 32.3*  MCV 83.4  --  85.4  --   --  86.7 88.0 87.5  PLT 222  --  237  --   --  271 309 321   < > = values in this interval not displayed.   Basic Metabolic Panel: Recent Labs  Lab 12/10/17 0224 12/11/17 0449 12/13/17 0317 12/14/17 0236 12/15/17 0217  NA 137 138 138 140 138  K 4.9 4.6 5.1 4.2 4.2  CL 103 103 103 105 103  CO2 25 24 27 28 28   GLUCOSE 114* 100* 108* 92 87  BUN 26* 28* 29* 21* 20  CREATININE 0.72 0.81 0.83 0.75 0.76  CALCIUM 8.6* 8.7* 8.9 9.1 8.7*  MG  --   --   --  1.7 1.8   GFR: Estimated Creatinine Clearance: 34.3 mL/min (by C-G formula based on SCr of 0.76 mg/dL). Liver Function Tests: Recent Labs  Lab 12/09/17 0226  AST 22  ALT 15  ALKPHOS 43  BILITOT 0.8  PROT 5.5*  ALBUMIN 2.6*   No results for input(s): LIPASE, AMYLASE in the last 168 hours. No results  for input(s): AMMONIA in the last 168 hours. Coagulation Profile: Recent Labs  Lab 12/10/17 0224  INR 1.31   Cardiac Enzymes: No results for input(s): CKTOTAL, CKMB, CKMBINDEX, TROPONINI in the last 168 hours. BNP (last 3 results) No results for input(s): PROBNP in the last 8760 hours. HbA1C: No results for input(s): HGBA1C in the last 72 hours. CBG: No results for input(s): GLUCAP in the last 168 hours. Lipid Profile: No results for input(s): CHOL, HDL, LDLCALC, TRIG, CHOLHDL, LDLDIRECT in the last 72 hours. Thyroid Function Tests: No results for input(s): TSH, T4TOTAL, FREET4, T3FREE, THYROIDAB in the last 72 hours. Anemia Panel: Recent Labs    12/13/17 0317  FERRITIN 22  TIBC 349  IRON 14*   Sepsis Labs:  No results for input(s): PROCALCITON, LATICACIDVEN in the last 168 hours.  Recent Results (from the past 240 hour(s))  MRSA PCR Screening     Status: None   Collection Time: 12/11/17  5:04 PM  Result Value Ref Range Status   MRSA by PCR NEGATIVE NEGATIVE Final    Comment:        The GeneXpert MRSA Assay (FDA approved for NASAL specimens only), is one component of Stephane Niemann comprehensive MRSA colonization surveillance program. It is not intended to diagnose MRSA infection nor to guide or monitor treatment for MRSA infections. Performed at [facility], [place] [address] [place], [place] [address]          Radiology Studies: Dg Chest 2 View  Result Date: 12/13/2017 CLINICAL DATA:  Shortness of breath EXAM: CHEST - 2 VIEW COMPARISON:  12/10/2017 chest radiograph FINDINGS: Small left pleural effusion with associated atelectasis. Mild cardiomegaly with calcific aortic atherosclerosis. No pneumothorax. Unchanged diffuse interstitial prominence. IMPRESSION: Small left pleural effusion with associated left basilar atelectasis. Electronically Signed   By: Ulyses Jarred M.D.   On: 12/13/2017 21:58        Scheduled Meds: . atorvastatin  10 mg Oral QHS  .  calcium-vitamin D  1 tablet Oral Daily  . cholecalciferol  1,000 Units Oral Daily  . colestipol  1 g Oral TID  . cycloSPORINE  1 drop Both Eyes BID  . digoxin  0.0625 mg Oral Daily  . diltiazem  45 mg Oral Q6H  . feeding supplement  1 Container Oral TID BM  . ferrous sulfate  325 mg Oral BID WC  . fluticasone  2 spray Each Nare BID  . gabapentin  300 mg Oral BID  . gabapentin  900 mg Oral QHS  . loperamide  2 mg Oral Q breakfast  . loratadine  10 mg Oral Daily  . metoprolol tartrate  25 mg Oral TID  . multivitamin with minerals  1 tablet Oral Daily  . pantoprazole  40 mg Oral Daily  . phytonadione  2 mg Oral Once  . sodium chloride flush  3 mL Intravenous Q12H   Continuous Infusions:    LOS: 9 days    Time spent: over 30 min    Fayrene Helper, MD [facility] Pager [phone]  If 7PM-7AM, please contact night-coverage [email or web address] Password [facility] 12/15/2017, 8:51 AM

## 2017-12-15 NOTE — Progress Notes (Addendum)
  Amiodarone Drug - Drug Interaction Consult Note  Recommendations: Patient on warfarin PTA, but currently on hold due to acute anemia. Per FM note from yesterday, decision has been made to discontinue warfarin permanently. However, if warfarin is resumed, will require a reduced dose of as compared to previous PTA regimen.   Amiodarone is metabolized by the cytochrome P450 system and therefore has the potential to cause many drug interactions. Amiodarone has an average plasma half-life of 50 days (range 20 to 100 days).   There is potential for drug interactions to occur several weeks or months after stopping treatment and the onset of drug interactions may be slow after initiating amiodarone.   [x]  Statins: Increased risk of myopathy. Simvastatin- restrict dose to 20mg  daily. Other statins: counsel patients to report any muscle pain or weakness immediately.  []  Anticoagulants: Amiodarone can increase anticoagulant effect. Consider warfarin dose reduction. Patients should be monitored closely and the dose of anticoagulant altered accordingly, remembering that amiodarone levels take several weeks to stabilize.  []  Antiepileptics: Amiodarone can increase plasma concentration of phenytoin, the dose should be reduced. Note that small changes in phenytoin dose can result in large changes in levels. Monitor patient and counsel on signs of toxicity.  []  Beta blockers: increased risk of bradycardia, AV block and myocardial depression. Sotalol - avoid concomitant use.  []   Calcium channel blockers (diltiazem and verapamil): increased risk of bradycardia, AV block and myocardial depression.  []   Cyclosporine: Amiodarone increases levels of cyclosporine. Reduced dose of cyclosporine is recommended.  []  Digoxin dose should be halved when amiodarone is started.  []  Diuretics: increased risk of cardiotoxicity if hypokalemia occurs.  []  Oral hypoglycemic agents (glyburide, glipizide, glimepiride):  increased risk of hypoglycemia. Patient's glucose levels should be monitored closely when initiating amiodarone therapy.   []  Drugs that prolong the QT interval:  Torsades de pointes risk may be increased with concurrent use - avoid if possible.  Monitor QTc, also keep magnesium/potassium WNL if concurrent therapy can't be avoided. Marland Kitchen Antibiotics: e.g. fluoroquinolones, erythromycin. . Antiarrhythmics: e.g. quinidine, procainamide, disopyramide, sotalol. . Antipsychotics: e.g. phenothiazines, haloperidol.  . Lithium, tricyclic antidepressants, and methadone.  Thank you,   Mila Merry. Gerarda Fraction, PharmD PGY1 Pharmacy Resident Pager: [phone] 12/15/2017   12:00 PM

## 2017-12-15 NOTE — Progress Notes (Signed)
Progress Note  Patient Name: [name] Date of Encounter: 12/15/2017  Primary Cardiologist: Shelva Majestic, MD   Subjective   Doing well with no complaints of CP or SOB  Inpatient Medications    Scheduled Meds: . atorvastatin  10 mg Oral QHS  . calcium-vitamin D  1 tablet Oral Daily  . cholecalciferol  1,000 Units Oral Daily  . colestipol  1 g Oral TID  . cycloSPORINE  1 drop Both Eyes BID  . digoxin  0.0625 mg Oral Daily  . diltiazem  45 mg Oral Q6H  . feeding supplement  1 Container Oral TID BM  . ferrous sulfate  325 mg Oral BID WC  . fluticasone  2 spray Each Nare BID  . gabapentin  300 mg Oral BID  . gabapentin  900 mg Oral QHS  . loperamide  2 mg Oral Q breakfast  . loratadine  10 mg Oral Daily  . metoprolol tartrate  25 mg Oral TID  . multivitamin with minerals  1 tablet Oral Daily  . pantoprazole  40 mg Oral Daily  . phytonadione  2 mg Oral Once  . sodium chloride flush  3 mL Intravenous Q12H   Continuous Infusions:  PRN Meds: acetaminophen, alum & mag hydroxide-simeth, ondansetron **OR** ondansetron (ZOFRAN) IV   Vital Signs    Vitals:   12/15/17 0600 12/15/17 0737 12/15/17 0954 12/15/17 0956  BP: 90/62 116/72  (!) 105/59  Pulse:  89 (!) 105   Resp:  18    Temp:  97.8 F (36.6 C)    TempSrc:  Oral    SpO2: 100% 97%    Weight:      Height:        Intake/Output Summary (Last 24 hours) at 12/15/2017 1132 Last data filed at 12/15/2017 0959 Gross per 24 hour  Intake 376.02 ml  Output 350 ml  Net 26.02 ml   Filed Weights   12/10/17 0704 12/11/17 0600 12/12/17 0415  Weight: 107 lb (48.5 kg) 114 lb 4.8 oz (51.8 kg) 109 lb 2 oz (49.5 kg)    Telemetry    Atrial fibrillation with CVR - Personally Reviewed  ECG    No new EKG to review - Personally Reviewed  Physical Exam   GEN: No acute distress.   Neck: No JVD Cardiac: irregularly irregular, no murmurs, rubs, or gallops.  Respiratory: Clear to auscultation bilaterally. GI: Soft,  nontender, non-distended  MS: No edema; No deformity. Neuro:  Nonfocal  Psych: Normal affect   Labs    Chemistry Recent Labs  Lab 12/09/17 0226  12/13/17 0317 12/14/17 0236 12/15/17 0217  NA 136   < > 138 140 138  K 3.1*   < > 5.1 4.2 4.2  CL 102   < > 103 105 103  CO2 26   < > 27 28 28   GLUCOSE 117*   < > 108* 92 87  BUN 24*   < > 29* 21* 20  CREATININE 0.66   < > 0.83 0.75 0.76  CALCIUM 8.1*   < > 8.9 9.1 8.7*  PROT 5.5*  --   --   --   --   ALBUMIN 2.6*  --   --   --   --   AST 22  --   --   --   --   ALT 15  --   --   --   --   ALKPHOS 43  --   --   --   --  BILITOT 0.8  --   --   --   --   GFRNONAA >60   < > 59* >60 >60  GFRAA >60   < > >60 >60 >60  ANIONGAP 8   < > 8 7 7    < > = values in this interval not displayed.     Hematology Recent Labs  Lab 12/13/17 0317 12/14/17 0236 12/15/17 0217  WBC 8.0 7.1 6.3  RBC 3.46* 3.67* 3.69*  HGB 9.2* 9.8* 9.8*  HCT 30.0* 32.3* 32.3*  MCV 86.7 88.0 87.5  MCH 26.6 26.7 26.6  MCHC 30.7 30.3 30.3  RDW 17.9* 17.7* 17.3*  PLT 271 309 321    Cardiac EnzymesNo results for input(s): TROPONINI in the last 168 hours. No results for input(s): TROPIPOC in the last 168 hours.   BNP Recent Labs  Lab 12/09/17 0226  BNP 933.3*     DDimer No results for input(s): DDIMER in the last 168 hours.   Radiology    Dg Chest 2 View  Result Date: 12/13/2017 CLINICAL DATA:  Shortness of breath EXAM: CHEST - 2 VIEW COMPARISON:  12/10/2017 chest radiograph FINDINGS: Small left pleural effusion with associated atelectasis. Mild cardiomegaly with calcific aortic atherosclerosis. No pneumothorax. Unchanged diffuse interstitial prominence. IMPRESSION: Small left pleural effusion with associated left basilar atelectasis. Electronically Signed   By: Ulyses Jarred M.D.   On: 12/13/2017 21:58    Cardiac Studies   Echo 12/09/17: Study Conclusions - Left ventricle: The cavity size was normal. Wall thickness was increased in a pattern of  mild LVH. Systolic function was normal. The estimated ejection fraction was in the range of 60% to 65%. Wall motion was normal; there were no regional wall motion abnormalities. - Aortic valve: There was mild stenosis. Valve area (VTI): 0.66 cm^2. Valve area (Vmax): 0.56 cm^2. Valve area (Vmean): 0.55 cm^2. - Mitral valve: There was moderate regurgitation. Valve area by continuity equation (using LVOT flow): 1.46 cm^2. - Left atrium: The atrium was severely dilated. - Right atrium: The atrium was mildly dilated. - Tricuspid valve: There was moderate regurgitation. - Pulmonary arteries: Systolic pressure was moderately to severely increased. PA peak pressure: 65 mm Hg (S).  Patient Profile     84 y.o. female with history of PAF(on coumadin prior to admit), mitral regurgitation, mod AS, CV dz, HTN, HL and nonobstructive CAD who was admitted with SOB andsignificant anemia withHgbof4 (INR 3.27) and later developed afib with RVR   Assessment & Plan    Atrial fib with RVRlikely driven by marked anemia. --HR still poorly controlled and goes up to 110-120's with exertion --she dropped her BP into the 70's again last night --stop BB and CCB --start Amio IV with bolus of 150mg  daily for rate control since BP prohibits use of BB or CCB --stop dig given advanced age and initiation of Amio --coumadin stopped in setting of acute anemia and INR reversed with Vit K given  --INR 1.31 on 4/23 - after discussion with family and patient Anticoagulation will not be restarted.  Acute anemia --Hgb at 4 on admit  But has been stable for 4 days at 9.2-9.8 --transfused multiple PRBCs --EGD with hiatal hernia and no source of bleeding, colonoscopy too high risk.  --per TR[facility]  Acute hypoxia due to acute pulmonary edema. --she is neg 2L and weight is down 7lbs from admit --her I&Os appear incomplete from yesterday --does not appear volume overloaded on exam today  Troponin  elevation  --0.27/0.24 likely related to  demand ischemia in setting of severe anemia and afib with RVR --2D echo with normal LVF and wall motion  Moderate to severe MR- monitor   HTN  --BP dropped again overnight into the 52'D systolic but this am is [id number]/42PNTI --stop Cardizem and BB for now      For questions or updates, please contact [facility] Please consult [email or web address] for contact info under Cardiology/STEMI.      Signed, Fransico Him, MD  12/15/2017, 11:32 AM

## 2017-12-16 LAB — BASIC METABOLIC PANEL
Anion gap: 7 (ref 5–15)
BUN: 17 mg/dL (ref 6–20)
CO2: 27 mmol/L (ref 22–32)
Calcium: 8.6 mg/dL — ABNORMAL LOW (ref 8.9–10.3)
Chloride: 104 mmol/L (ref 101–111)
Creatinine, Ser: 0.78 mg/dL (ref 0.44–1.00)
GFR calc Af Amer: >60 mL/min (ref >60)
GFR calc non Af Amer: >60 mL/min (ref >60)
Glucose, Bld: 87 mg/dL (ref 65–99)
Potassium: 4.2 mmol/L (ref 3.5–5.1)
Sodium: 138 mmol/L (ref 135–145)

## 2017-12-16 LAB — CBC
HCT: 31 % — ABNORMAL LOW (ref 36.0–46.0)
Hemoglobin: 9.4 g/dL — ABNORMAL LOW (ref 12.0–15.0)
MCH: 26.6 pg (ref 26.0–34.0)
MCHC: 30.3 g/dL (ref 30.0–36.0)
MCV: 87.6 fL (ref 78.0–100.0)
Platelets: 340 10*3/uL (ref 150–400)
RBC: 3.54 MIL/uL — ABNORMAL LOW (ref 3.87–5.11)
RDW: 17.4 % — ABNORMAL HIGH (ref 11.5–15.5)
WBC: 8.2 10*3/uL (ref 4.0–10.5)

## 2017-12-16 LAB — MAGNESIUM: Magnesium: 1.5 mg/dL — ABNORMAL LOW (ref 1.7–2.4)

## 2017-12-16 MED ORDER — MAGNESIUM OXIDE 400 (241.3 MG) MG PO TABS
400.0000 mg | ORAL_TABLET | Freq: Two times a day (BID) | ORAL | Status: DC
  Administered 2017-12-16 – 2017-12-17 (×3): 400 mg via ORAL
  Filled 2017-12-16 (×3): qty 1

## 2017-12-16 MED ORDER — AMIODARONE HCL 200 MG PO TABS
200.0000 mg | ORAL_TABLET | Freq: Two times a day (BID) | ORAL | Status: DC
  Administered 2017-12-16 – 2017-12-17 (×3): 200 mg via ORAL
  Filled 2017-12-16 (×3): qty 1

## 2017-12-16 MED ORDER — MAGNESIUM SULFATE 2 GM/50ML IV SOLN
2.0000 g | Freq: Once | INTRAVENOUS | Status: DC
  Filled 2017-12-16: qty 50

## 2017-12-16 NOTE — Progress Notes (Signed)
PROGRESS NOTE    [name]  WRU:[id number] DOB: 02/13/1922 DOA: 12/06/2017 PCP: Deland Pretty, MD    Brief Narrative: [name] is [name] 84 y.o. female with [name] history of PAF on coumadin, valvular heart disease, iron deficiency anemia, HTN and HLD who presented to the ED from ILD after [name] fall in the setting of [name] month of increasing debility and dark stools. Hemoglobin was found to be 4 (from baseline ~11) with positive FOBT and INR of 3.27. Transfusions and vitamin K were provided and EGD ultimately EGD performed 4/23 showing no evidence or source of bleeding. Blood counts had stabilized, though she required diuresis after transfusions. After discussion with the family, GI and the patient's cardiologist, [name], the plan was to discharge home 4/24 and discontinue coumadin. She continues to require supplemental oxygen and has now developed AFib w/RVR, so formal cardiology consultation was requested. She has continued to require diltiazem infusion.  Assessment & Plan:   Principal Problem:   Severe anemia Active Problems:   Moderate to severe pulmonary hypertension (HCC)   Dyspnea on exertion   Valvular heart disease   NSTEMI (non-ST elevated myocardial infarction) (HCC)   PAF (paroxysmal atrial fibrillation) (HCC)   Chronic anticoagulation, with coumadin, with PAF and CHADS2Vasc2 score of 4   Anemia, iron deficiency   Long term current use of anticoagulant therapy   CAD (coronary artery disease)   Chronic GI bleeding   Anemia due to acute blood loss   Elevated troponin   Atrial fibrillation with RVR (HCC)   Acute blood loss anemia on chronic iron deficiency anemia due to chronic GI bleeding: EGD revealed hiatal hernia without any source of bleeding.  - Colonoscopy felt to be too high risk, though pt may follow up with Dr. Watt Cook if bleeding returns and invasive work up is requested.  - Hgb improved from 4 to 9-10 stable with.  stable.  S/p 3 units pRBC. - Holding  coumadin, after discussion with [name] and the patient with her family, the plan has been to discontinue coumadin.  - Tolerating soft diet - Empiric PPI - Recheck CBC in AM - start on iron   Acute hypoxia due to acute pulmonary edema: With multiple PRBC transfusions. She has diuresed effectively and is actually negative balance recorded for the hospitalization. - Most recent CXR without pulm edema or pneumonia.  Small effusions. - Measure I/O, daily weights - hold additional lasix for now with labile BP's and stable on RA (was on O2 last night, but again able to easily wean at the bedside) - O2 sats documented with pt ambulating 4/27 look odd, will discuss with nursing timing to repeat this  Wt Readings from Last 3 Encounters:  12/12/17 49.5 kg (109 lb 2 oz)  11/08/17 52.9 kg (116 lb 9.6 oz)  10/21/17 52.2 kg (115 lb)   PAF now with rapid ventricular rate: - With increasing frailty/fall risk, severe anemia and presumed cryptogenic GI bleeding source, will DC anticoagulation. INR mildly supratherapeutic on admission at 3.27.   - transitioned to amiodarone yesterday.  Dc'd dilt, dig, and metop.  Still with RVR, worsened with minimal activity.   - Cardiology appreciate cards recs.   - Monitor K, Mg - TSH 0.650  Hypotension: BP's labile, consistently drop overnight, but improved last night with change above.  continue to follow with RVR and adjusting meds above  Type II NSTEMI: No history of obstructive CAD and no chest pain. Troponin elevation without ischemic ECG changes  in the setting of severe anemia most consistent with demand ischemia.  - Continue statin, beta blocker.   Valvular heart disease: Echo repeated as inpatient, see below.  Normal EF.  Mild AV stenosis.  Moderate MV regurgitation.  Elevated PASP. - Per cardiology. Pt of [name]  Hypokalemia  Hypomagnesemia:  - Replaced and at goal of >4. Recheck BMP in AM.  Follow mag.  Replete prn.  Chronic diarrhea:  continue home imodium and colestid    DVT prophylaxis: SCD Code Status: partial (CPR, no intubation) Family Communication: discussed with son Disposition Plan: pending improvement   Consultants:   Cardiology  Gastroenterology  Procedures:  4/22 Echo Study Conclusions  - Left ventricle: The cavity size was normal. Wall thickness was   increased in [name] pattern of mild LVH. Systolic function was normal.   The estimated ejection fraction was in the range of 60% to 65%.   Wall motion was normal; there were no regional wall motion   abnormalities. - Aortic valve: There was mild stenosis. Valve area (VTI): 0.66   cm^2. Valve area (Vmax): 0.56 cm^2. Valve area (Vmean): 0.55   cm^2. - Mitral valve: There was moderate regurgitation. Valve area by   continuity equation (using LVOT flow): 1.46 cm^2. - Left atrium: The atrium was severely dilated. - Right atrium: The atrium was mildly dilated. - Tricuspid valve: There was moderate regurgitation. - Pulmonary arteries: Systolic pressure was moderately to severely   increased. PA peak pressure: 65 mm Hg (S  EGD by Dr. Watt Cook 12/10/2017: Findings: The larynx was normal. [name] medium-sized hiatal hernia was present. One benign-appearing, intrinsic mild stenosis was found. The stenosis  was traversed. The entire examined stomach was normal. The duodenal bulb, first portion of the duodenum and second portion of  the duodenum were normal. The exam was otherwise without abnormality. Impression: - Normal larynx. - Medium-sized hiatal hernia. - Benign-appearing esophageal stenosis. - Normal stomach. - Normal duodenal bulb, first portion of the  duodenum and second portion of the duodenum. - The examination was otherwise  normal. - No specimens collected. Recommendation: - Patient has [name] contact number available for  emergencies. The signs and symptoms of potential  delayed complications were discussed with the  patient. Return to normal activities tomorrow.  Written discharge instructions were provided to the  patient. - Soft diet today. - Continue present medications. - Return to GI clinic PRN. - Telephone GI clinic if symptomatic PRN.   Antimicrobials Anti-infectives (From admission, onward)   None     Subjective: Feels better. No CP or SOB. Feels more energy.  Objective: Vitals:   12/15/17 1636 12/15/17 2010 12/16/17 0033 12/16/17 0411  BP: 102/70 110/85 93/65 96/71   Pulse: (!) 108 (!) 134 65 97  Resp: 18     Temp: (!) 97.4 F (36.3 C) (!) 97.5 F (36.4 C) 97.7 F (36.5 C) 98 F (36.7 C)  TempSrc: Oral Oral Oral Oral  SpO2: 100% 94% 99% 100%  Weight:      Height:        Intake/Output Summary (Last 24 hours) at 12/16/2017 0805 Last data filed at 12/16/2017 0134 Gross per 24 hour  Intake 6662 ml  Output 300 ml  Net 6362 ml   Filed Weights   12/10/17 0704 12/11/17 0600 12/12/17 0415  Weight: 48.5 kg (107 lb) 51.8 kg (114 lb 4.8 oz) 49.5 kg (109 lb 2 oz)    Examination:  General: No acute distress.  Frail.  Cardiovascular: Heart sounds show [name]  tachycardic irregular regular rate, and rhythm. No gallops or rubs. No murmurs. No JVD. Lungs: Clear to auscultation bilaterally with good air movement. No rales, rhonchi or wheezes. Abdomen: Soft, nontender, nondistended with normal active bowel sounds. No masses. No hepatosplenomegaly. Neurological: Alert and oriented 3. Moves all extremities 4. Cranial nerves II through  XII grossly intact. Skin: Warm and dry. No rashes or lesions. Extremities: No clubbing or cyanosis. No edema. Pedal pulses 2+. Psychiatric: Mood and affect are normal. Insight and judgment are appropriate.   Data Reviewed: I have personally reviewed following labs and imaging studies  CBC: Recent Labs  Lab 12/10/17 0224  12/11/17 0449 12/13/17 0317 12/14/17 0236 12/15/17 0217 12/16/17 0142  WBC 7.6  --   --  8.0 7.1 6.3 8.2  NEUTROABS  --   --   --   --   --  4.0  --   HGB 9.6*   < > 9.6* 9.2* 9.8* 9.8* 9.4*  HCT 29.9*   < > 30.8* 30.0* 32.3* 32.3* 31.0*  MCV 85.4  --   --  86.7 88.0 87.5 87.6  PLT 237  --   --  271 309 321 340   < > = values in this interval not displayed.   Basic Metabolic Panel: Recent Labs  Lab 12/11/17 0449 12/13/17 0317 12/14/17 0236 12/15/17 0217 12/16/17 0142  NA 138 138 140 138 138  K 4.6 5.1 4.2 4.2 4.2  CL 103 103 105 103 104  CO2 24 27 28 28 27   GLUCOSE 100* 108* 92 87 87  BUN 28* 29* 21* 20 17  CREATININE 0.81 0.83 0.75 0.76 0.78  CALCIUM 8.7* 8.9 9.1 8.7* 8.6*  MG  --   --  1.7 1.8 1.5*   GFR: Estimated Creatinine Clearance: 34.3 mL/min (by C-G formula based on SCr of 0.78 mg/dL). Liver Function Tests: No results for input(s): AST, ALT, ALKPHOS, BILITOT, PROT, ALBUMIN in the last 168 hours. No results for input(s): LIPASE, AMYLASE in the last 168 hours. No results for input(s): AMMONIA in the last 168 hours. Coagulation Profile: Recent Labs  Lab 12/10/17 0224  INR 1.31   Cardiac Enzymes: No results for input(s): CKTOTAL, CKMB, CKMBINDEX, TROPONINI in the last 168 hours. BNP (last 3 results) No results for input(s): PROBNP in the last 8760 hours. HbA1C: No results for input(s): HGBA1C in the last 72 hours. CBG: No results for input(s): GLUCAP in the last 168 hours. Lipid Profile: No results for input(s): CHOL, HDL, LDLCALC, TRIG, CHOLHDL, LDLDIRECT in the last 72 hours. Thyroid Function Tests: No results for input(s):  TSH, T4TOTAL, FREET4, T3FREE, THYROIDAB in the last 72 hours. Anemia Panel: No results for input(s): VITAMINB12, FOLATE, FERRITIN, TIBC, IRON, RETICCTPCT in the last 72 hours. Sepsis Labs: No results for input(s): PROCALCITON, LATICACIDVEN in the last 168 hours.  Recent Results (from the past 240 hour(s))  MRSA PCR Screening     Status: None   Collection Time: 12/11/17  5:04 PM  Result Value Ref Range Status   MRSA by PCR NEGATIVE NEGATIVE Final    Comment:        The GeneXpert MRSA Assay (FDA approved for NASAL specimens only), is one component of [facility] comprehensive MRSA colonization surveillance program. It is not intended to diagnose MRSA infection nor to guide or monitor treatment for MRSA infections. Performed at [facility], [place] [address], [place], [place] [address]          Radiology Studies: Dg Chest [place]  1 View  Result Date: 12/15/2017 CLINICAL DATA:  Hypoxia.  Shortness of breath. EXAM: PORTABLE CHEST 1 VIEW COMPARISON:  12/13/2017 FINDINGS: There is lung base opacity consistent with small effusions and mild atelectasis. Chronic bronchitic changes are noted at both lung bases. No evidence of pneumonia or pulmonary edema. No pneumothorax. No significant change from the previous day's study. IMPRESSION: 1. No convincing pneumonia or pulmonary edema. 2. Small pleural effusions with mild lung base atelectasis. Chronic lung base bronchitic changes. Electronically Signed   By: Lajean Manes M.D.   On: 12/15/2017 17:44        Scheduled Meds: . atorvastatin  10 mg Oral QHS  . calcium-vitamin D  1 tablet Oral Daily  . cholecalciferol  1,000 Units Oral Daily  . colestipol  1 g Oral TID  . cycloSPORINE  1 drop Both Eyes BID  . feeding supplement  1 Container Oral TID BM  . ferrous sulfate  325 mg Oral BID WC  . fluticasone  2 spray Each Nare BID  . gabapentin  300 mg Oral BID  . gabapentin  900 mg Oral QHS  . loperamide  2 mg Oral Q breakfast  . loratadine  10  mg Oral Daily  . multivitamin with minerals  1 tablet Oral Daily  . pantoprazole  40 mg Oral Daily  . phytonadione  2 mg Oral Once  . sodium chloride flush  3 mL Intravenous Q12H   Continuous Infusions: . amiodarone 30 mg/hr (12/16/17 0134)  . magnesium sulfate 1 - 4 g bolus IVPB       LOS: 10 days    Time spent: over 30 min    Fayrene Helper, MD [facility] Pager [phone]  If 7PM-7AM, please contact night-coverage [email or web address] Password [email or web address] 12/16/2017, 8:05 AM

## 2017-12-16 NOTE — Progress Notes (Signed)
Physical Therapy Treatment Patient Details Name: [name] MRN: [id number] DOB: 04-18-1922 Today's Date: 12/16/2017    History of Present Illness 84yo female admitted after a fall and 1 month of black stools, SOB. Diagnosed with severe iron deficient anemia secondary to GI bleed, dypsnea, and elevated troponins. PMH anemia, anxiety, OA, HTN, hx L foot fracture treated with surgery, memory disorder, neuropathy, PAF, hx cardiac cath, TKR, hernia repair     PT Comments    Pt received in bed, pleasant and eager to participate in therapy. Pt ambulated 150 feet with RW min guard assist on RA. SpO2 96-100% and HR fluctuated 102-135.   Follow Up Recommendations  Home health PT;Other (comment)(HHPT in ILF in medically cleared)     Equipment Recommendations  3in1 (PT)    Recommendations for Other Services       Precautions / Restrictions Precautions Precautions: Fall;Other (comment) Precaution Comments: watch SpO2 and HR     Mobility  Bed Mobility Overal bed mobility: Modified Independent         Sit to supine: Modified independent (Device/Increase time)   General bed mobility comments: +rail, increased time  Transfers   Equipment used: Ambulation equipment used Transfers: Sit to/from [employer] to Stand: Supervision Stand pivot transfers: Supervision       General transfer comment: increased time to stabilize initial standing balance  Ambulation/Gait Ambulation/Gait assistance: Min guard Ambulation Distance (Feet): 150 Feet Assistive device: Rolling walker (2 wheeled) Gait Pattern/deviations: Step-through pattern;Trunk flexed;Decreased stride length Gait velocity: decreased Gait velocity interpretation: 1.31 - 2.62 ft/sec, indicative of limited community ambulator General Gait Details: fluctuating HR 102-135. Pt ambulated on RA with SpO2 96-100%. No SOB noted.   Stairs             Wheelchair Mobility    Modified Rankin (Stroke  Patients Only)       Balance   Sitting-balance support: No upper extremity supported;Feet supported Sitting balance-Leahy Scale: Good     Standing balance support: Bilateral upper extremity supported;During functional activity Standing balance-Leahy Scale: Fair                              Cognition Arousal/Alertness: Awake/alert Behavior During Therapy: WFL for tasks assessed/performed Overall Cognitive Status: Within Functional Limits for tasks assessed                                        Exercises      General Comments        Pertinent Vitals/Pain Pain Assessment: No/denies pain    Home Living                      Prior Function            PT Goals (current goals can now be found in the care plan section) Acute Rehab PT Goals Patient Stated Goal: to go home with assist of outside caregiver as needed PT Goal Formulation: With patient Time For Goal Achievement: 12/24/17 Potential to Achieve Goals: Fair Progress towards PT goals: Progressing toward goals    Frequency    Min 3X/week      PT Plan Current plan remains appropriate    Co-evaluation              AM-PAC PT "6 Clicks" Daily Activity  Outcome Measure  Difficulty  turning over in bed (including adjusting bedclothes, sheets and blankets)?: None Difficulty moving from lying on back to sitting on the side of the bed? : None Difficulty sitting down on and standing up from a chair with arms (e.g., wheelchair, bedside commode, etc,.)?: None Help needed moving to and from a bed to chair (including a wheelchair)?: A Little Help needed walking in hospital room?: A Little Help needed climbing 3-5 steps with a railing? : A Lot 6 Click Score: 20    End of Session Equipment Utilized During Treatment: Gait belt Activity Tolerance: Patient tolerated treatment well Patient left: in chair;with call bell/phone within reach Nurse Communication: Mobility  status PT Visit Diagnosis: Unsteadiness on feet (R26.81);History of falling (Z91.81);Muscle weakness (generalized) (M62.81);Difficulty in walking, not elsewhere classified (R26.2)     Time: 9826-4158 PT Time Calculation (min) (ACUTE ONLY): 24 min  Charges:  $Gait Training: 23-37 mins                    G Codes:       Lorrin Goodell, PT  Office # [phone] Pager [phone]    Lorriane Shire 12/16/2017, 9:57 AM

## 2017-12-16 NOTE — Progress Notes (Signed)
Patient refused Boost supplement.  Prefers milk based.  Provided a strawberry Ensure instead.  Patient drank all of it.

## 2017-12-16 NOTE — NC FL2 (Signed)
[place] LEVEL OF CARE SCREENING TOOL     IDENTIFICATION  Patient Name: [name] Birthdate: Aug 13, 1922 Sex: female Admission Date (Current Location): 12/06/2017  [facility] and [place] Number:  Herbalist and Address:  The . [facility], [place] [address] [place], [place] [address]      Provider Number: [id number]  Attending Physician Name and Address:  Elodia Florence., *  Relative Name and Phone Number:       Current Level of Care: Hospital Recommended Level of Care: [place] Prior Approval Number:    Date Approved/Denied:   PASRR Number: [id number]  Discharge Plan: SNF    Current Diagnoses: Patient Active Problem List   Diagnosis Date Noted  . Elevated troponin   . Atrial fibrillation with RVR ([place])   . Anemia due to acute blood loss   . Chronic GI bleeding 12/06/2017  . Severe anemia 12/06/2017  . Pelvic mass in female 02/19/2017  . CAD (coronary artery disease)   . Abnormality of gait 09/07/2015  . Left carotid bruit 08/10/2015  . (HFpEF) heart failure with preserved ejection fraction ([place]) 05/08/2015  . ARF (acute renal failure) ([place]) 05/08/2015  . Renal failure 05/08/2015  . Long term current use of anticoagulant therapy 09/20/2014  . PAF (paroxysmal atrial fibrillation) ([place]) 09/16/2014  . Chronic anticoagulation, with coumadin, with PAF and CHADS2Vasc2 score of 4 09/16/2014  . S/P cardiac cath 09/14/14 mild calcification of ostial RCA but otherwise normal coronary arteries 09/16/2014  . Anemia, iron deficiency 09/16/2014  . Non-ST elevation myocardial infarction (NSTEMI), initial care episode, secondary to PAF most likely 09/13/2014  . NSTEMI (non-ST elevated myocardial infarction) ([place]) 09/13/2014  . Memory disorder 03/05/2014  . Pulmonary hypertension ([place]) 12/10/2013  . Severe tricuspid regurgitation 12/10/2013  . Non-rheumatic mitral regurgitation 12/10/2013  . Dyspnea on exertion  11/03/2013  . Edema of both legs 11/03/2013  . Valvular heart disease 11/03/2013  . Bradycardia 09/02/2013  . Mild aortic stenosis 03/03/2013  . Moderate to severe pulmonary hypertension ([place]) 03/03/2013  . Essential hypertension 03/03/2013  . Mixed hyperlipidemia 03/03/2013  . Polyneuropathy in other diseases classified elsewhere ([place]) 11/10/2012    Orientation RESPIRATION BLADDER Height & Weight     Self, Time, Situation, Place  O2(2L [place]) Continent Weight: 109 lb 2 oz (49.5 kg) Height:  5\' 4"  (162.6 cm)  BEHAVIORAL SYMPTOMS/MOOD NEUROLOGICAL BOWEL NUTRITION STATUS      Continent Diet(see DC summary)  AMBULATORY STATUS COMMUNICATION OF NEEDS Skin   Limited Assist Verbally Normal                       Personal Care Assistance Level of Assistance  Bathing, Dressing Bathing Assistance: Limited assistance   Dressing Assistance: Limited assistance     Functional Limitations Info             SPECIAL CARE FACTORS FREQUENCY  PT (By licensed PT), OT (By licensed OT)     PT Frequency: 5/wk OT Frequency: 5/wk            Contractures      Additional Factors Info  Code Status, Allergies Code Status Info: Partial Allergies Info: Avelox Moxifloxacin Hcl In Nacl, Moxifloxacin, Aricept Donepezil Hcl, Codeine, Donepezil, Garlic, Latex, Onion, Sulfa Drugs Cross Reactors, Duloxetine, Polysporin Bacitracin-polymyxin B           Current Medications (12/16/2017):  This is the current hospital active medication list Current Facility-Administered Medications  Medication  Dose Route Frequency Provider Last Rate Last Dose  . acetaminophen (TYLENOL) tablet 500 mg  500 mg Oral Q6H PRN Patrecia Pour, MD   500 mg at 12/15/17 1959  . alum & mag hydroxide-simeth (MAALOX/MYLANTA) 200-200-20 MG/5ML suspension 30 mL  30 mL Oral Q4H PRN Patrecia Pour, MD   30 mL at 12/11/17 2337  . amiodarone (PACERONE) tablet 200 mg  200 mg Oral BID Minus Breeding, MD   200 mg at 12/16/17 1310  .  atorvastatin (LIPITOR) tablet 10 mg  10 mg Oral QHS Patrecia Pour, MD   10 mg at 12/15/17 2151  . calcium-vitamin D (OSCAL WITH D) 500-200 MG-UNIT per tablet 1 tablet  1 tablet Oral Daily Patrecia Pour, MD   1 tablet at 12/16/17 1008  . cholecalciferol (VITAMIN D) tablet 1,000 Units  1,000 Units Oral Daily Patrecia Pour, MD   1,000 Units at 12/16/17 1009  . colestipol (COLESTID) tablet 1 g  1 g Oral TID Patrecia Pour, MD   1 g at 12/16/17 1310  . cycloSPORINE (RESTASIS) 0.05 % ophthalmic emulsion 1 drop  1 drop Both Eyes BID Patrecia Pour, MD   1 drop at 12/16/17 1009  . feeding supplement (BOOST / RESOURCE BREEZE) liquid 1 Container  1 Container Oral TID BM Patrecia Pour, MD   1 Container at 12/16/17 1312  . ferrous sulfate tablet 325 mg  325 mg Oral BID WC Elodia Florence., MD   325 mg at 12/16/17 1009  . fluticasone (FLONASE) 50 MCG/ACT nasal spray 2 spray  2 spray Each Nare BID Patrecia Pour, MD   2 spray at 12/16/17 1017  . gabapentin (NEURONTIN) capsule 300 mg  300 mg Oral BID Patrecia Pour, MD   300 mg at 12/16/17 1009  . gabapentin (NEURONTIN) capsule 900 mg  900 mg Oral QHS Patrecia Pour, MD   900 mg at 12/15/17 2152  . loperamide (IMODIUM) capsule 2 mg  2 mg Oral Q breakfast Elodia Florence., MD   2 mg at 12/16/17 1009  . loratadine (CLARITIN) tablet 10 mg  10 mg Oral Daily Patrecia Pour, MD   10 mg at 12/16/17 1008  . magnesium oxide (MAG-OX) tablet 400 mg  400 mg Oral BID Elodia Florence., MD   400 mg at 12/16/17 1310  . multivitamin with minerals tablet 1 tablet  1 tablet Oral Daily Patrecia Pour, MD   1 tablet at 12/16/17 1009  . ondansetron (ZOFRAN) tablet 4 mg  4 mg Oral Q6H PRN Patrecia Pour, MD       Or  . ondansetron [facility]) injection 4 mg  4 mg Intravenous Q6H PRN Patrecia Pour, MD   4 mg at 12/14/17 1930  . pantoprazole (PROTONIX) EC tablet 40 mg  40 mg Oral Daily Patrecia Pour, MD   40 mg at 12/16/17 1009  . phytonadione (VITAMIN K) oral solution 1 mg/0.5 mL   2 mg Oral Once Vance Gather B, MD      . sodium chloride flush (NS) 0.9 % injection 3 mL  3 mL Intravenous Q12H Patrecia Pour, MD   3 mL at 12/16/17 1011     Discharge Medications: Please see discharge summary for a list of discharge medications.  Relevant Imaging Results:  Relevant Lab Results:   Additional Information SS#: [id number]  Jorge Ny, LCSW

## 2017-12-16 NOTE — Progress Notes (Addendum)
Progress Note  Patient Name: [name] Date of Encounter: 12/16/2017  Primary Cardiologist: Shelva Majestic, MD   Subjective   No chest pain, maybe mild SOB. No lightheadedness no dizziness.   Inpatient Medications    Scheduled Meds: . atorvastatin  10 mg Oral QHS  . calcium-vitamin D  1 tablet Oral Daily  . cholecalciferol  1,000 Units Oral Daily  . colestipol  1 g Oral TID  . cycloSPORINE  1 drop Both Eyes BID  . feeding supplement  1 Container Oral TID BM  . ferrous sulfate  325 mg Oral BID WC  . fluticasone  2 spray Each Nare BID  . gabapentin  300 mg Oral BID  . gabapentin  900 mg Oral QHS  . loperamide  2 mg Oral Q breakfast  . loratadine  10 mg Oral Daily  . multivitamin with minerals  1 tablet Oral Daily  . pantoprazole  40 mg Oral Daily  . phytonadione  2 mg Oral Once  . sodium chloride flush  3 mL Intravenous Q12H   Continuous Infusions: . amiodarone 30 mg/hr (12/16/17 0134)  . magnesium sulfate 1 - 4 g bolus IVPB     PRN Meds: acetaminophen, alum & mag hydroxide-simeth, ondansetron **OR** ondansetron (ZOFRAN) IV   Vital Signs    Vitals:   12/15/17 2010 12/16/17 0033 12/16/17 0411 12/16/17 0943  BP: 110/85 93/65 96/71  115/72  Pulse: (!) 134 65 97 (!) 111  Resp:      Temp: (!) 97.5 F (36.4 C) 97.7 F (36.5 C) 98 F (36.7 C) (!) 97.5 F (36.4 C)  TempSrc: Oral Oral Oral Oral  SpO2: 94% 99% 100% 98%  Weight:      Height:        Intake/Output Summary (Last 24 hours) at 12/16/2017 1034 Last data filed at 12/16/2017 0134 Gross per 24 hour  Intake 6539 ml  Output 300 ml  Net 6239 ml   Filed Weights   12/10/17 0704 12/11/17 0600 12/12/17 0415  Weight: 107 lb (48.5 kg) 114 lb 4.8 oz (51.8 kg) 109 lb 2 oz (49.5 kg)    Telemetry    A fib with rate 105 but with any activity to 120 - Personally Reviewed  ECG    No new from the 27th - Personally Reviewed  Physical Exam   GEN: No acute distress.   Neck: No JVD Cardiac: irreg irreg,  2/6 systolic murmur, no rubs, or gallops.  Respiratory: rales in bases to auscultation bilaterally. No rhonchi or wheezes GI: Soft, nontender, non-distended  MS: No edema; No deformity. Neuro:  Nonfocal  Psych: Normal affect   Labs    Chemistry Recent Labs  Lab 12/14/17 0236 12/15/17 0217 12/16/17 0142  NA 140 138 138  K 4.2 4.2 4.2  CL 105 103 104  CO2 28 28 27   GLUCOSE 92 87 87  BUN 21* 20 17  CREATININE 0.75 0.76 0.78  CALCIUM 9.1 8.7* 8.6*  GFRNONAA >60 >60 >60  GFRAA >60 >60 >60  ANIONGAP 7 7 7      Hematology Recent Labs  Lab 12/14/17 0236 12/15/17 0217 12/16/17 0142  WBC 7.1 6.3 8.2  RBC 3.67* 3.69* 3.54*  HGB 9.8* 9.8* 9.4*  HCT 32.3* 32.3* 31.0*  MCV 88.0 87.5 87.6  MCH 26.7 26.6 26.6  MCHC 30.3 30.3 30.3  RDW 17.7* 17.3* 17.4*  PLT 309 321 340    Cardiac EnzymesNo results for input(s): TROPONINI in the last 168 hours. No results for  input(s): TROPIPOC in the last 168 hours.   BNPNo results for input(s): BNP, PROBNP in the last 168 hours.   DDimer No results for input(s): DDIMER in the last 168 hours.   Radiology    Dg Chest Port 1 View  Result Date: 12/15/2017 CLINICAL DATA:  Hypoxia.  Shortness of breath. EXAM: PORTABLE CHEST 1 VIEW COMPARISON:  12/13/2017 FINDINGS: There is lung base opacity consistent with small effusions and mild atelectasis. Chronic bronchitic changes are noted at both lung bases. No evidence of pneumonia or pulmonary edema. No pneumothorax. No significant change from the previous day's study. IMPRESSION: 1. No convincing pneumonia or pulmonary edema. 2. Small pleural effusions with mild lung base atelectasis. Chronic lung base bronchitic changes. Electronically Signed   By: Lajean Manes M.D.   On: 12/15/2017 17:44    Cardiac Studies   Echo 12/09/17: Study Conclusions - Left ventricle: The cavity size was normal. Wall thickness was increased in a pattern of mild LVH. Systolic function was normal. The estimated ejection  fraction was in the range of 60% to 65%. Wall motion was normal; there were no regional wall motion abnormalities. - Aortic valve: There was mild stenosis. Valve area (VTI): 0.66 cm^2. Valve area (Vmax): 0.56 cm^2. Valve area (Vmean): 0.55 cm^2. - Mitral valve: There was moderate regurgitation. Valve area by continuity equation (using LVOT flow): 1.46 cm^2. - Left atrium: The atrium was severely dilated. - Right atrium: The atrium was mildly dilated. - Tricuspid valve: There was moderate regurgitation. - Pulmonary arteries: Systolic pressure was moderately to severely increased. PA peak pressure: 65 mm Hg (S).     Patient Profile     84 y.o. female with history of PAF(on coumadin prior to admit), mitral regurgitation, mod AS, CV dz, HTN, HL and nonobstructive CAD who was admitted with SOB andsignificant anemia withHgbof4 (INR 3.27) and later developed afib with RVR  Assessment & Plan    Atrial fib with RVRlikely driven bymarked anemia. --HR still poorly controlled and goes up to 110-120's with exertion IV amiodarone was started yesterday with rest HR 105. --she dropped her BP into the 70's 12/15/17 and BB and CCB stopped now still low end 90/57 to 115/72  -stopped dig given advanced age and initiation of Amio --coumadin stopped in setting of acute anemia and INR reversed withVit K given INR 1.31 on 4/23 - after discussion with family and patient anticoagulation will not be restarted.  Acute anemia --Hgb at 4on admit  But has been stable for 5 days at 9.2-9.8 --transfused multiple PRBCs --EGD with hiatal hernia and no source of bleeding, colonoscopy too high risk. --per TRH  Acute hypoxia due to acute pulmonary edema. --she was neg2L but with hypotension yesterday she is + 4 L today  If correct.  No wt.    Troponin elevation --0.27/0.24 likely related todemand ischemia in setting of severe anemia and afib with RVR --2D echo with normal LVF and  wall motion  Moderate to severe MR- monitor   HTN --BP dropped again overnight into the 79'G systolic yesterday --stopped Cardizem and BB yesterday  Better today  Hypomagnesium replaced per IM    For questions or updates, please contact [facility] Please consult [email or web address] for contact info under Cardiology/STEMI.      Signed, Cecilie Kicks, NP  12/16/2017, 10:34 AM    History and all data above reviewed.  Patient examined.  I agree with the findings as above.    The patient exam reveals COR:  Irregular  ,  Lungs: Clear  ,  Abd: Positive bowel sounds, no rebound no guarding, Ext No edema  .  All available labs, radiology testing, previous records reviewed. Agree with documented assessment and plan. I spoke with the patient and discussed the fact that our options are limited.  She is off of dig which was not controlling her rate.  She cannot tolerate titration of beta blocker or Cardizem.  She cannot be on anticoagulation.  Understanding that there is a small risk of cardioversion if she is on amiodarone I see this as the only reasonable choice for rate control.  I have changed her to PO amio.  She has no IVs for IV amiodarone.  I think that she can go home with this realizing that rate control will not be optimal.  She will go back to [facility] and use an Transport planner for any long ambulation.  We will arranged follow up.    Jeneen Rinks Camree Wigington  12:35 PM  12/16/2017

## 2017-12-16 NOTE — Plan of Care (Signed)
Discussed plan of care with patient and her son.  Encouraged patient to continue using incentive spirometer.  Patient is very knowledgeable about her medications and diagnosis.  Patient is always very pleasant.

## 2017-12-17 ENCOUNTER — Inpatient Hospital Stay (HOSPITAL_COMMUNITY): Payer: Medicare Other

## 2017-12-17 DIAGNOSIS — Z96651 Presence of right artificial knee joint: Secondary | ICD-10-CM | POA: Diagnosis not present

## 2017-12-17 DIAGNOSIS — M1990 Unspecified osteoarthritis, unspecified site: Secondary | ICD-10-CM | POA: Diagnosis not present

## 2017-12-17 DIAGNOSIS — E2740 Unspecified adrenocortical insufficiency: Secondary | ICD-10-CM | POA: Diagnosis not present

## 2017-12-17 DIAGNOSIS — R2681 Unsteadiness on feet: Secondary | ICD-10-CM | POA: Diagnosis not present

## 2017-12-17 DIAGNOSIS — J9811 Atelectasis: Secondary | ICD-10-CM | POA: Diagnosis not present

## 2017-12-17 DIAGNOSIS — D62 Acute posthemorrhagic anemia: Secondary | ICD-10-CM | POA: Diagnosis not present

## 2017-12-17 DIAGNOSIS — I21A1 Myocardial infarction type 2: Secondary | ICD-10-CM | POA: Diagnosis not present

## 2017-12-17 DIAGNOSIS — Z85828 Personal history of other malignant neoplasm of skin: Secondary | ICD-10-CM | POA: Diagnosis not present

## 2017-12-17 DIAGNOSIS — R5381 Other malaise: Secondary | ICD-10-CM | POA: Diagnosis not present

## 2017-12-17 DIAGNOSIS — R41841 Cognitive communication deficit: Secondary | ICD-10-CM | POA: Diagnosis not present

## 2017-12-17 DIAGNOSIS — I272 Pulmonary hypertension, unspecified: Secondary | ICD-10-CM | POA: Diagnosis not present

## 2017-12-17 DIAGNOSIS — I350 Nonrheumatic aortic (valve) stenosis: Secondary | ICD-10-CM | POA: Diagnosis not present

## 2017-12-17 DIAGNOSIS — I080 Rheumatic disorders of both mitral and aortic valves: Secondary | ICD-10-CM | POA: Diagnosis not present

## 2017-12-17 DIAGNOSIS — G309 Alzheimer's disease, unspecified: Secondary | ICD-10-CM | POA: Diagnosis not present

## 2017-12-17 DIAGNOSIS — E876 Hypokalemia: Secondary | ICD-10-CM | POA: Diagnosis not present

## 2017-12-17 DIAGNOSIS — I252 Old myocardial infarction: Secondary | ICD-10-CM | POA: Diagnosis not present

## 2017-12-17 DIAGNOSIS — D500 Iron deficiency anemia secondary to blood loss (chronic): Secondary | ICD-10-CM | POA: Diagnosis not present

## 2017-12-17 DIAGNOSIS — R4189 Other symptoms and signs involving cognitive functions and awareness: Secondary | ICD-10-CM | POA: Diagnosis not present

## 2017-12-17 DIAGNOSIS — D649 Anemia, unspecified: Secondary | ICD-10-CM | POA: Diagnosis not present

## 2017-12-17 DIAGNOSIS — Z7901 Long term (current) use of anticoagulants: Secondary | ICD-10-CM | POA: Diagnosis not present

## 2017-12-17 DIAGNOSIS — K449 Diaphragmatic hernia without obstruction or gangrene: Secondary | ICD-10-CM | POA: Diagnosis not present

## 2017-12-17 DIAGNOSIS — G9009 Other idiopathic peripheral autonomic neuropathy: Secondary | ICD-10-CM | POA: Diagnosis not present

## 2017-12-17 DIAGNOSIS — I5032 Chronic diastolic (congestive) heart failure: Secondary | ICD-10-CM | POA: Diagnosis not present

## 2017-12-17 DIAGNOSIS — L814 Other melanin hyperpigmentation: Secondary | ICD-10-CM | POA: Diagnosis not present

## 2017-12-17 DIAGNOSIS — D509 Iron deficiency anemia, unspecified: Secondary | ICD-10-CM | POA: Diagnosis not present

## 2017-12-17 DIAGNOSIS — I10 Essential (primary) hypertension: Secondary | ICD-10-CM | POA: Diagnosis not present

## 2017-12-17 DIAGNOSIS — R0602 Shortness of breath: Secondary | ICD-10-CM | POA: Diagnosis not present

## 2017-12-17 DIAGNOSIS — I480 Paroxysmal atrial fibrillation: Secondary | ICD-10-CM | POA: Diagnosis not present

## 2017-12-17 DIAGNOSIS — E785 Hyperlipidemia, unspecified: Secondary | ICD-10-CM | POA: Diagnosis not present

## 2017-12-17 DIAGNOSIS — J810 Acute pulmonary edema: Secondary | ICD-10-CM | POA: Diagnosis not present

## 2017-12-17 DIAGNOSIS — M159 Polyosteoarthritis, unspecified: Secondary | ICD-10-CM | POA: Diagnosis not present

## 2017-12-17 DIAGNOSIS — Z9181 History of falling: Secondary | ICD-10-CM | POA: Diagnosis not present

## 2017-12-17 DIAGNOSIS — C44329 Squamous cell carcinoma of skin of other parts of face: Secondary | ICD-10-CM | POA: Diagnosis not present

## 2017-12-17 DIAGNOSIS — R0902 Hypoxemia: Secondary | ICD-10-CM | POA: Diagnosis not present

## 2017-12-17 DIAGNOSIS — D485 Neoplasm of uncertain behavior of skin: Secondary | ICD-10-CM | POA: Diagnosis not present

## 2017-12-17 DIAGNOSIS — G629 Polyneuropathy, unspecified: Secondary | ICD-10-CM | POA: Diagnosis not present

## 2017-12-17 DIAGNOSIS — C50819 Malignant neoplasm of overlapping sites of unspecified female breast: Secondary | ICD-10-CM | POA: Diagnosis not present

## 2017-12-17 DIAGNOSIS — R4789 Other speech disturbances: Secondary | ICD-10-CM | POA: Diagnosis not present

## 2017-12-17 DIAGNOSIS — L570 Actinic keratosis: Secondary | ICD-10-CM | POA: Diagnosis not present

## 2017-12-17 DIAGNOSIS — K921 Melena: Secondary | ICD-10-CM | POA: Diagnosis not present

## 2017-12-17 DIAGNOSIS — K922 Gastrointestinal hemorrhage, unspecified: Secondary | ICD-10-CM | POA: Diagnosis not present

## 2017-12-17 DIAGNOSIS — E782 Mixed hyperlipidemia: Secondary | ICD-10-CM | POA: Diagnosis not present

## 2017-12-17 DIAGNOSIS — R269 Unspecified abnormalities of gait and mobility: Secondary | ICD-10-CM | POA: Diagnosis not present

## 2017-12-17 DIAGNOSIS — G63 Polyneuropathy in diseases classified elsewhere: Secondary | ICD-10-CM | POA: Diagnosis not present

## 2017-12-17 DIAGNOSIS — I341 Nonrheumatic mitral (valve) prolapse: Secondary | ICD-10-CM | POA: Diagnosis not present

## 2017-12-17 DIAGNOSIS — R2689 Other abnormalities of gait and mobility: Secondary | ICD-10-CM | POA: Diagnosis not present

## 2017-12-17 DIAGNOSIS — L989 Disorder of the skin and subcutaneous tissue, unspecified: Secondary | ICD-10-CM | POA: Diagnosis not present

## 2017-12-17 DIAGNOSIS — M6281 Muscle weakness (generalized): Secondary | ICD-10-CM | POA: Diagnosis not present

## 2017-12-17 DIAGNOSIS — Z79899 Other long term (current) drug therapy: Secondary | ICD-10-CM | POA: Diagnosis not present

## 2017-12-17 LAB — BASIC METABOLIC PANEL
Anion gap: 9 (ref 5–15)
BUN: 14 mg/dL (ref 6–20)
CO2: 30 mmol/L (ref 22–32)
Calcium: 9 mg/dL (ref 8.9–10.3)
Chloride: 101 mmol/L (ref 101–111)
Creatinine, Ser: 0.74 mg/dL (ref 0.44–1.00)
GFR calc Af Amer: >60 mL/min (ref >60)
GFR calc non Af Amer: >60 mL/min (ref >60)
Glucose, Bld: 106 mg/dL — ABNORMAL HIGH (ref 65–99)
Potassium: 4 mmol/L (ref 3.5–5.1)
Sodium: 140 mmol/L (ref 135–145)

## 2017-12-17 LAB — HEPATIC FUNCTION PANEL
ALT: 19 U/L (ref 14–54)
AST: 22 U/L (ref 15–41)
Albumin: 3 g/dL — ABNORMAL LOW (ref 3.5–5.0)
Alkaline Phosphatase: 56 U/L (ref 38–126)
Bilirubin, Direct: <0.1 mg/dL — ABNORMAL LOW (ref 0.1–0.5)
Total Bilirubin: 0.5 mg/dL (ref 0.3–1.2)
Total Protein: 6.2 g/dL — ABNORMAL LOW (ref 6.5–8.1)

## 2017-12-17 LAB — LIPASE, BLOOD: Lipase: 27 U/L (ref 11–51)

## 2017-12-17 LAB — CBC
HCT: 33.4 % — ABNORMAL LOW (ref 36.0–46.0)
Hemoglobin: 10.3 g/dL — ABNORMAL LOW (ref 12.0–15.0)
MCH: 26.8 pg (ref 26.0–34.0)
MCHC: 30.8 g/dL (ref 30.0–36.0)
MCV: 86.8 fL (ref 78.0–100.0)
Platelets: 353 10*3/uL (ref 150–400)
RBC: 3.85 MIL/uL — ABNORMAL LOW (ref 3.87–5.11)
RDW: 17.4 % — ABNORMAL HIGH (ref 11.5–15.5)
WBC: 8.7 10*3/uL (ref 4.0–10.5)

## 2017-12-17 LAB — GLUCOSE, CAPILLARY: Glucose-Capillary: 100 mg/dL — ABNORMAL HIGH (ref 65–99)

## 2017-12-17 LAB — MAGNESIUM: Magnesium: 1.7 mg/dL (ref 1.7–2.4)

## 2017-12-17 MED ORDER — FUROSEMIDE 20 MG PO TABS: 20.0000 mg | ORAL_TABLET | Freq: Once | ORAL | Status: DC

## 2017-12-17 MED ORDER — AMIODARONE HCL 200 MG PO TABS: 200.0000 mg | ORAL_TABLET | Freq: Two times a day (BID) | ORAL | 0 refills | Status: DC

## 2017-12-17 MED ORDER — FUROSEMIDE 20 MG PO TABS
20.0000 mg | ORAL_TABLET | Freq: Once | ORAL | Status: AC
  Administered 2017-12-17: 20 mg via ORAL
  Filled 2017-12-17: qty 1

## 2017-12-17 MED ORDER — FUROSEMIDE 20 MG PO TABS: 20.0000 mg | ORAL_TABLET | Freq: Once | ORAL | 0 refills | Status: DC

## 2017-12-17 MED ORDER — FERROUS SULFATE 325 (65 FE) MG PO TABS: 325.0000 mg | ORAL_TABLET | Freq: Two times a day (BID) | ORAL | 0 refills | Status: DC

## 2017-12-17 NOTE — Progress Notes (Signed)
Pt informed and agreed to be discharged back to [facility]. Pt alert and oriented x4. Pt son and daughter-in-law also informed of discharge. Removed pt IV. Went over discharge instructions with pt and pt's daughter-in-law. Answered all questions. Pt wheeled off unit by RN with belongings and accompanied by daughter-in-law to be transported to [facility] by son.

## 2017-12-17 NOTE — Progress Notes (Signed)
Progress Note  Patient Name: [name] Date of Encounter: 12/17/2017  Primary Cardiologist: Shelva Majestic, MD   Subjective   She had nausea yesterday but she says that she feels very good today.  No pain, nausea or SOB.  Inpatient Medications    Scheduled Meds: . amiodarone  200 mg Oral BID  . atorvastatin  10 mg Oral QHS  . calcium-vitamin D  1 tablet Oral Daily  . cholecalciferol  1,000 Units Oral Daily  . colestipol  1 g Oral TID  . cycloSPORINE  1 drop Both Eyes BID  . feeding supplement  1 Container Oral TID BM  . ferrous sulfate  325 mg Oral BID WC  . fluticasone  2 spray Each Nare BID  . gabapentin  300 mg Oral BID  . gabapentin  900 mg Oral QHS  . loperamide  2 mg Oral Q breakfast  . loratadine  10 mg Oral Daily  . magnesium oxide  400 mg Oral BID  . multivitamin with minerals  1 tablet Oral Daily  . pantoprazole  40 mg Oral Daily  . phytonadione  2 mg Oral Once  . sodium chloride flush  3 mL Intravenous Q12H   Continuous Infusions:  PRN Meds: acetaminophen, alum & mag hydroxide-simeth, ondansetron **OR** ondansetron (ZOFRAN) IV   Vital Signs    Vitals:   12/16/17 2335 12/16/17 2340 12/17/17 0436 12/17/17 0729  BP: 116/71 110/84  (!) 123/94  Pulse: (!) 113 (!) 147  78  Resp: 20   16  Temp: 98 F (36.7 C) (!) 97.4 F (36.3 C)  97.6 F (36.4 C)  TempSrc: Oral Oral  Oral  SpO2: 98% 92%  93%  Weight:   113 lb 12.1 oz (51.6 kg)   Height:        Intake/Output Summary (Last 24 hours) at 12/17/2017 1140 Last data filed at 12/17/2017 0905 Gross per 24 hour  Intake 840 ml  Output -  Net 840 ml   Filed Weights   12/12/17 0415 12/16/17 1600 12/17/17 0436  Weight: 109 lb 2 oz (49.5 kg) 115 lb 8.3 oz (52.4 kg) 113 lb 12.1 oz (51.6 kg)    Telemetry    A fib with rate 105 but with any activity to 120 - Personally Reviewed  ECG    No new from the 27th - Personally Reviewed  Physical Exam   GEN: No  acute distress.   Neck: No  JVD Cardiac:  RRR, 3/6 apical holosystolic murmur, no diastolic murmurs, rubs, or gallops.  Respiratory: Clear   to auscultation bilaterally. GI: Soft, nontender, non-distended, normal bowel sounds  MS:  No edema; No deformity. Neuro:   Nonfocal  Psych: Oriented and appropriate    Labs    Chemistry Recent Labs  Lab 12/15/17 0217 12/16/17 0142 12/17/17 0343  NA 138 138 140  K 4.2 4.2 4.0  CL 103 104 101  CO2 28 27 30   GLUCOSE 87 87 106*  BUN 20 17 14   CREATININE 0.76 0.78 0.74  CALCIUM 8.7* 8.6* 9.0  PROT  --   --  6.2*  ALBUMIN  --   --  3.0*  AST  --   --  22  ALT  --   --  19  ALKPHOS  --   --  56  BILITOT  --   --  0.5  GFRNONAA >60 >60 >60  GFRAA >60 >60 >60  ANIONGAP 7 7 9      Hematology Recent Labs  Lab 12/15/17 0217 12/16/17 0142 12/17/17 0343  WBC 6.3 8.2 8.7  RBC 3.69* 3.54* 3.85*  HGB 9.8* 9.4* 10.3*  HCT 32.3* 31.0* 33.4*  MCV 87.5 87.6 86.8  MCH 26.6 26.6 26.8  MCHC 30.3 30.3 30.8  RDW 17.3* 17.4* 17.4*  PLT 321 340 353    Cardiac EnzymesNo results for input(s): TROPONINI in the last 168 hours. No results for input(s): TROPIPOC in the last 168 hours.   BNPNo results for input(s): BNP, PROBNP in the last 168 hours.   DDimer No results for input(s): DDIMER in the last 168 hours.   Radiology    Dg Chest Port 1 View  Result Date: 12/17/2017 CLINICAL DATA:  Increased shortness of breath EXAM: PORTABLE CHEST 1 VIEW COMPARISON:  12/15/2017 FINDINGS: Cardiomegaly with vascular congestion. Increasing interstitial prominence, likely interstitial edema. Bibasilar atelectasis with layering bilateral effusions. IMPRESSION: Increasing interstitial prominence throughout the lungs, likely interstitial edema. Small bilateral layering effusions with bibasilar atelectasis. Electronically Signed   By: Rolm Baptise M.D.   On: 12/17/2017 10:17   Dg Chest Port 1 View  Result Date: 12/15/2017 CLINICAL DATA:  Hypoxia.  Shortness of breath. EXAM: PORTABLE CHEST 1 VIEW  COMPARISON:  12/13/2017 FINDINGS: There is lung base opacity consistent with small effusions and mild atelectasis. Chronic bronchitic changes are noted at both lung bases. No evidence of pneumonia or pulmonary edema. No pneumothorax. No significant change from the previous day's study. IMPRESSION: 1. No convincing pneumonia or pulmonary edema. 2. Small pleural effusions with mild lung base atelectasis. Chronic lung base bronchitic changes. Electronically Signed   By: Lajean Manes M.D.   On: 12/15/2017 17:44    Cardiac Studies   Echo 12/09/17: Study Conclusions - Left ventricle: The cavity size was normal. Wall thickness was increased in a pattern of mild LVH. Systolic function was normal. The estimated ejection fraction was in the range of 60% to 65%. Wall motion was normal; there were no regional wall motion abnormalities. - Aortic valve: There was mild stenosis. Valve area (VTI): 0.66 cm^2. Valve area (Vmax): 0.56 cm^2. Valve area (Vmean): 0.55 cm^2. - Mitral valve: There was moderate regurgitation. Valve area by continuity equation (using LVOT flow): 1.46 cm^2. - Left atrium: The atrium was severely dilated. - Right atrium: The atrium was mildly dilated. - Tricuspid valve: There was moderate regurgitation. - Pulmonary arteries: Systolic pressure was moderately to severely increased. PA peak pressure: 65 mm Hg (S).     Patient Profile     84 y.o. female with history of PAF(on coumadin prior to admit), mitral regurgitation, mod AS, CV dz, HTN, HL and nonobstructive CAD who was admitted with SOB andsignificant anemia withHgbof4 (INR 3.27) and later developed afib with RVR  Assessment & Plan    Atrial fib with RVR We were unable to rate control with AV blocking agents. Despite the fact that she was not on anticoagulation (and never can be) the only option was amiodarone.  She converted early this AM to NSR.  I would continue the amiodarone 200 mg bid at  discharge and we can dose adjust likely to once daily.  She has follow up next week in our clinic.    Acute anemia CBC stable.  No active bleeding.   Troponin elevation Demand ischemia.  No further work up.  Moderate to severe MR Manage medically.    HTN BP is improved.  Continue current therapy.       For questions or updates, please contact [facility] Please consult  [email or web address] for contact info under Cardiology/STEMI.      Signed, Minus Breeding, MD  12/17/2017, 11:40 AM

## 2017-12-17 NOTE — Progress Notes (Signed)
Patient will discharge to [facility] Anticipated discharge date: 4/30 Family notified: pt son Magazine features editor by Corey Harold- scheduled for 3pm Report #: [phone]  CSW signing off.  Jorge Ny, LCSW Clinical Social Worker [phone]

## 2017-12-17 NOTE — Progress Notes (Addendum)
Physical Therapy Treatment Patient Details Name: [name] MRN: [id number] DOB: 21-Jan-1922 Today's Date: 12/17/2017    History of Present Illness Pt is a 84 y.o. female admitted from [place] on 12/06/17 after a fall and 1 month h/o black stools with SOB. Dx with severe iron deficient-anemia secondary to GI bleed. PMH includes anemia, anxiety, OA, HTN, hx L foot fx s/p sx, memory disorder, neuropathy, PAF, TKA.   PT Comments    Pt progressing with mobility. Able to amb 180' with RW and intermittent min guard for balance. Performed repeated sit<>stands (15x total) with bilat UE support, single UE support, and no UE support. Educ on seated LE therex. Pt very motivated to return to PLOF and independence. Plans to d/c today to [facility]. If remains admitted, will follow acutely.    Follow Up Recommendations  Home health PT;Other (comment)(HHPT in [facility])     Equipment Recommendations  3in1 (PT)    Recommendations for Other Services       Precautions / Restrictions Precautions Precautions: Fall Restrictions Weight Bearing Restrictions: No    Mobility  Bed Mobility               General bed mobility comments: Received sitting in recliner  Transfers Overall transfer level: Needs assistance Equipment used: Rolling walker (2 wheeled) Transfers: Sit to/from Stand Sit to Stand: Supervision         General transfer comment: Stood from recliner and [facility] with RW and supervision for safety. Performed 5x sit<>stand with BUE support, 5x sit<>stand with single UE support, and additional 5x sit<>stand with no UE support; seated rest breaks in between with supervision for safety.   Ambulation/Gait Ambulation/Gait assistance: Min guard Ambulation Distance (Feet): 180 Feet Assistive device: Rolling walker (2 wheeled) Gait Pattern/deviations: Step-through pattern;Decreased stride length;Trunk flexed Gait velocity: Decreased   General Gait Details: Slow, steady amb with RW and  intermittent min guard for balance. Required repeated cues for trunk extension to maintain fully upright posture, as pt tends to flex forward with RW. HR 79-87   Stairs             Wheelchair Mobility    Modified Rankin (Stroke Patients Only)       Balance Overall balance assessment: Needs assistance;History of Falls Sitting-balance support: No upper extremity supported;Feet supported Sitting balance-Leahy Scale: Good     Standing balance support: Bilateral upper extremity supported;During functional activity   Standing balance comment: Can static stand with no UE support                            Cognition Arousal/Alertness: Awake/alert Behavior During Therapy: WFL for tasks assessed/performed Overall Cognitive Status: Within Functional Limits for tasks assessed                                        Exercises General Exercises - Lower Extremity Long Arc Quad: AROM;Both;10 reps;Seated Hip Flexion/Marching: AROM;Both;10 reps;Seated    General Comments        Pertinent Vitals/Pain Pain Assessment: No/denies pain    Home Living                      Prior Function            PT Goals (current goals can now be found in the care plan section) Acute Rehab PT Goals  Patient Stated Goal: to go home with assist of outside caregiver as needed PT Goal Formulation: With patient Time For Goal Achievement: 12/24/17 Potential to Achieve Goals: Fair Progress towards PT goals: Progressing toward goals    Frequency    Min 3X/week      PT Plan Current plan remains appropriate    Co-evaluation              AM-PAC PT "6 Clicks" Daily Activity  Outcome Measure  Difficulty turning over in bed (including adjusting bedclothes, sheets and blankets)?: None Difficulty moving from lying on back to sitting on the side of the bed? : None Difficulty sitting down on and standing up from a chair with arms (e.g., wheelchair,  bedside commode, etc,.)?: None Help needed moving to and from a bed to chair (including a wheelchair)?: A Little Help needed walking in hospital room?: A Little Help needed climbing 3-5 steps with a railing? : A Lot 6 Click Score: 20    End of Session Equipment Utilized During Treatment: Gait belt Activity Tolerance: Patient tolerated treatment well Patient left: in chair;with call bell/phone within reach;with family/visitor present Nurse Communication: Mobility status PT Visit Diagnosis: Unsteadiness on feet (R26.81);History of falling (Z91.81);Muscle weakness (generalized) (M62.81);Difficulty in walking, not elsewhere classified (R26.2)     Time: 0375-4360 PT Time Calculation (min) (ACUTE ONLY): 26 min  Charges:  $Gait Training: 8-22 mins $Therapeutic Exercise: 8-22 mins                    G Codes:      Mabeline Caras, PT, DPT Acute Rehab Services  Pager: [phone] 12/17/2017, 3:00 PM

## 2017-12-17 NOTE — Discharge Summary (Signed)
Physician Discharge Summary  [name] GNF:[id number] DOB: 1922-05-15 DOA: 12/06/2017  PCP: Deland Pretty, MD  Admit date: 12/06/2017 Discharge date: 12/17/2017  Time spent: 40 minutes  Recommendations for Outpatient Follow-up:  1. Follow up outpatient CBC/CMP/Mag (attention to Hb with recent GI bleed) 2. Plan is to discontinue warfarin.  If drop in H/H again, would f/u with Dr. Watt Cook. 3. Follow HR, pt discharged on amiodarone.  She had RVR with minimal activity, but she was in NSR at discharge.  Ensure follow up with cards as outpatient for adjustment of amiodarone. 4. Pt with some pulmonary edema on CXR at discharge and some crackles on exam.  She was satting well on room air.  Given dose of lasix x1 PO in hospital.  Discharged without any lasix to continue on, as she was doing well symptomatically and has been pretty fragile here with some notable hypotension earlier in her course when adjusting her meds for RVR.  Would continue to follow weights and respiratory status closely over the next few days and consider adding additional lasix as needed based on sx or weight.  Discharge Diagnoses:  Principal Problem:   Severe anemia Active Problems:   Moderate to severe pulmonary hypertension (HCC)   Dyspnea on exertion   Valvular heart disease   NSTEMI (non-ST elevated myocardial infarction) (HCC)   PAF (paroxysmal atrial fibrillation) (HCC)   Chronic anticoagulation, with coumadin, with PAF and CHADS2Vasc2 score of 4   Anemia, iron deficiency   Long term current use of anticoagulant therapy   CAD (coronary artery disease)   Chronic GI bleeding   Anemia due to acute blood loss   Elevated troponin   Atrial fibrillation with RVR ([place])   Discharge Condition: stable  Diet recommendation: heart healthy  Filed Weights   12/12/17 0415 12/16/17 1600 12/17/17 0436  Weight: 49.5 kg (109 lb 2 oz) 52.4 kg (115 lb 8.3 oz) 51.6 kg (113 lb 12.1 oz)    History of present illness:  Per  previous PN [name] 84 y.o.femalewith [name] history of PAF on coumadin, valvular heart disease, iron deficiency anemia, HTN and HLD who presented to the ED from ILD after [name] fall in the setting of [name] month of increasing debility and dark stools. Hemoglobin was found to be 4 (from baseline ~11) with positive FOBT and INR of 3.27. Transfusions and vitamin K were provided and EGD ultimately EGD performed 4/23 showing no evidence or source of bleeding. Blood counts had stabilized, though she required diuresis after transfusions. After discussion with the family, GI and the patient's cardiologist, Dr. Claiborne Billings, the plan was to discharge home 4/24 and discontinue coumadin. She continues to require supplemental oxygen and has now developed AFib w/RVR, so formal cardiology consultationwas requested.She was was started on diltiazem gtt with metoprolol.  Cardiology initially added digoxin, but her rate continued to be poorly controlled.  She had hypotension which limited uptitration of the previous regimen and was ultimately started on amiodarone.  She converted to NSR at discharge, but had continued to have RVR with minimal activity prior to this.    Hospital Course:  Acute blood loss anemia on chronic iron deficiency anemia due to chronic GI bleeding: EGD revealed hiatal hernia without any source of bleeding.  - Colonoscopy felt to be too high risk, though pt may follow up with Dr. Watt Cook if bleeding returns and invasive work up is requested.  - Hgb improved from 4 to 9-10 stable with.  Stable.  S/p 3 units  pRBC. - Holding coumadin, after discussion with Dr. Claiborne Billings and the patient with her family, the plan has been to discontinue coumadin.  - Empiric PPI - Recheck CBC in AM - Started on iron.  Acute hypoxia due to acute pulmonary edema:  Sounds like this occurred after her transfusions.  Responded well to lasix. - On day of discharge, had some crackles on exam.  CXR notable for intestitial edema. -  Given dose of lasix x1 here  - Would follow up weights and symptoms to determine need for additional lasix in future - Measure I/O, daily weights - ambulated with PT 4/29, ok on RA  Wt Readings from Last 3 Encounters:  12/17/17 51.6 kg (113 lb 12.1 oz)  11/08/17 52.9 kg (116 lb 9.6 oz)  10/21/17 52.2 kg (115 lb)    PAF now with rapid ventricular rate: - With increasing frailty/fall risk, severe anemia and presumed cryptogenic GI bleeding source, will DC anticoagulation. INR mildly supratherapeutic on admission at 3.27.  - transitioned to amiodarone yesterday.  Now in NSR.   -Cardiology appreciate cards recs.   - Monitor K, Mg - TSH 0.650  Hypotension: Improved.  Follow.  Type II NSTEMI: No history of obstructive CAD and no chest pain. Troponin elevation without ischemic ECG changes in the setting of severe anemia most consistent with demand ischemia.  - Continue statin, beta blocker.   Valvular heart disease: Echo repeated as inpatient, see below.  Normal EF.  Mild AV stenosis.  Moderate MV regurgitation.  Elevated PASP. - Per cardiology. Pt of Dr. Evette Georges  Hypokalemia  Hypomagnesemia:  - Replaced and at goal of >4.Recheck BMP in AM. Follow mag.  Replete prn.  Chronic diarrhea: continue home imodium and colestid  Nausea: nausea on night prior to discharge and some in the morning.  Sounds like it occurred after something she drank.  Feeling better after breakfast.    Procedures: Echo 4/22 Study Conclusions  - Left ventricle: The cavity size was normal. Wall thickness was   increased in Verner Kopischke pattern of mild LVH. Systolic function was normal.   The estimated ejection fraction was in the range of 60% to 65%.   Wall motion was normal; there were no regional wall motion   abnormalities. - Aortic valve: There was mild stenosis. Valve area (VTI): 0.66   cm^2. Valve area (Vmax): 0.56 cm^2. Valve area (Vmean): 0.55   cm^2. - Mitral valve: There was moderate regurgitation.  Valve area by   continuity equation (using LVOT flow): 1.46 cm^2. - Left atrium: The atrium was severely dilated. - Right atrium: The atrium was mildly dilated. - Tricuspid valve: There was moderate regurgitation. - Pulmonary arteries: Systolic pressure was moderately to severely   increased. PA peak pressure: 65 mm Hg (S).  4/23 endo - Normal larynx. - Medium-sized hiatal hernia. - Benign-appearing esophageal stenosis. - Normal stomach. - Normal duodenal bulb, first portion of the duodenum and second portion of the duodenum. - The examination was otherwise normal. - No specimens collected. - Patient has Cleatis Fandrich contact number available for emergencies. The signs and symptoms of potential delayed complications were discussed with the patient. Return to normal activities tomorrow. Written discharge instructions were provided to the patient. - Soft diet today. - Continue present medications. - Return to GI clinic PRN. - Telephone GI clinic if symptomatic PRN.  Consultations:  GI  Cardiology  Discharge Exam: Vitals:   12/17/17 0729 12/17/17 1215  BP: (!) 123/94 125/63  Pulse: 78 (!) 59  Resp: 16 (!)  28  Temp: 97.6 F (36.4 C) 97.7 F (36.5 C)  SpO2: 93% 94%   Felt poorly this AM.  Nausea.  No CP or SOB.  Started after she drank something last night. Tolerated breakfast well.  Felt better after  General: No acute distress. Cardiovascular: Heart sounds show Emalia Witkop regular rate, and rhythm. No gallops or rubs. No murmurs. No JVD. Lungs: Crackles at bases.  No increased WOB. Abdomen: Soft, nontender, nondistended with normal active bowel sounds. No masses. No hepatosplenomegaly. Neurological: Alert and oriented 3. Moves all extremities 4. Cranial nerves II through XII grossly intact. Skin: Warm and dry. No rashes or lesions. Extremities: No clubbing or cyanosis. No edema.  Psychiatric: Mood and affect are normal. Insight and judgment are appropriate.   Discharge  Instructions   Discharge Instructions    Call MD for:  difficulty breathing, headache or visual disturbances   Complete by:  As directed    Call MD for:  extreme fatigue   Complete by:  As directed    Call MD for:  persistant dizziness or light-headedness   Complete by:  As directed    Call MD for:  persistant nausea and vomiting   Complete by:  As directed    Call MD for:  severe uncontrolled pain   Complete by:  As directed    Call MD for:  temperature >100.4   Complete by:  As directed    Diet - low sodium heart healthy   Complete by:  As directed    Discharge instructions   Complete by:  As directed    You were seen for Chiffon Kittleson low hemoglobin due to GI bleeding.    Your warfarin has been stopped.  Your heart rate became fast after this episode and it took [place] Kanija Remmel while to decide on Abigail Marsiglia good regimen for you because it was difficult to control your heart rate.  We're planning to discharge you on amiodarone.  Your heart rate is still fast at times, but since you're feeling better, I think it's ok to be discharged home and follow up with cardiology as an outpatient.  You had Jaan Fischel little fluid on your lungs on the day of discharge.  I gave you Jalonda Antigua dose of lasix on the day of discharge.  We won't continue this when you go home, but you may need it just as needed.  Please follow up with your PCP or Dr. Claiborne Billings to determine whether you need this going forward.  Please follow up with Dr. Claiborne Billings as an outpatient.  You've been started on iron for your low blood counts.    Return with new, recurrent, or worsening symptoms.  Please ask your PCP to request records from this hospitalization so they know what was done and what the next steps will be.   Increase activity slowly   Complete by:  As directed      Allergies as of 12/17/2017      Reactions   Avelox [moxifloxacin Hcl In Nacl] Shortness Of Breath, Swelling   Moxifloxacin Other (See Comments), Shortness Of Breath, Swelling   other   Aricept  [donepezil Hcl] Diarrhea   Codeine Swelling      Donepezil Diarrhea   Garlic Diarrhea   Latex Hives, Itching, Rash, Other (See Comments)   Watery blisters    Onion Diarrhea   Sulfa Drugs Cross Reactors Swelling   Duloxetine Rash   Polysporin [bacitracin-polymyxin B] Other (See Comments)   other      Medication List  STOP taking these medications   carvedilol 3.125 MG tablet Commonly known as:  COREG   diltiazem 60 MG 12 hr capsule Commonly known as:  CARDIZEM SR   hydrALAZINE 25 MG tablet Commonly known as:  APRESOLINE   hydrochlorothiazide 25 MG tablet Commonly known as:  HYDRODIURIL   spironolactone 25 MG tablet Commonly known as:  ALDACTONE   warfarin 2 MG tablet Commonly known as:  COUMADIN     TAKE these medications   acetaminophen 500 MG tablet Commonly known as:  TYLENOL Take 500 mg by mouth 4 (four) times daily.   amiodarone 200 MG tablet Commonly known as:  PACERONE Take 1 tablet (200 mg total) by mouth 2 (two) times daily. (please follow up with cardiology to know how this should be taken in the future)   atorvastatin 20 MG tablet Commonly known as:  LIPITOR Take 0.5 tablets (10 mg total) by mouth at bedtime. What changed:  how much to take   [place] 1 application topically 2 (two) times daily as needed (dry skin (on feet and after showering)).   CALCIUM 600+D 600-800 MG-UNIT Tabs Generic drug:  Calcium Carb-Cholecalciferol Take 1 tablet by mouth daily.   cetirizine 10 MG tablet Commonly known as:  ZYRTEC Take 10 mg by mouth daily with breakfast.   cholecalciferol 1000 units tablet Commonly known as:  VITAMIN D Take 1,000 Units by mouth daily.   colestipol 1 g tablet Commonly known as:  COLESTID Take 1 g by mouth See admin instructions. Take one tablet (1 g) by mouth three times daily - morning, noon and bedtime (do not take with warfarin)   cycloSPORINE 0.05 % ophthalmic emulsion Commonly known as:   RESTASIS Place 1 drop into both eyes 2 (two) times daily.   ferrous sulfate 325 (65 FE) MG tablet Take 1 tablet (325 mg total) by mouth 2 (two) times daily with Merie Wulf meal.   fluticasone 50 MCG/ACT nasal spray Commonly known as:  FLONASE Place 2 sprays into both nostrils 2 (two) times daily.   gabapentin 300 MG capsule Commonly known as:  NEURONTIN TAKE 1 CAPSULE THREE TIMES DAILY AND 3 CAPSULES AT BEDTIME What changed:  See the new instructions.   IBGARD PO Take 1 tablet by mouth 2 (two) times daily.   LACTOBACILLUS RHAMNOSUS (GG) PO Take 1 capsule by mouth daily.   levETIRAcetam 250 MG tablet Commonly known as:  KEPPRA One tablet in the morning and 2 in the evening What changed:    how much to take  how to take this  when to take this  additional instructions   loperamide 2 MG tablet Commonly known as:  IMODIUM Ivi Griffith-D Take 2 mg by mouth daily with breakfast.   memantine 10 MG tablet Commonly known as:  NAMENDA TAKE 1 TABLET TWICE DAILY   mirtazapine 7.5 MG tablet Commonly known as:  REMERON Take 3.75 mg by mouth at bedtime.   multivitamin with minerals Tabs tablet Take 1 tablet by mouth daily.   pantoprazole 40 MG tablet Commonly known as:  PROTONIX Take 40 mg by mouth daily.   pyridOXINE 100 MG tablet Commonly known as:  VITAMIN B-6 Take 100 mg by mouth daily.   ranitidine 150 MG tablet Commonly known as:  ZANTAC Take 150 mg by mouth 2 (two) times daily.   simethicone 125 MG chewable tablet Commonly known as:  MYLICON Chew 195 mg by mouth as needed for flatulence.   vitamin B-12 1000 MCG tablet Commonly  known as:  CYANOCOBALAMIN Take 1,000 mcg by mouth daily.      Allergies  Allergen Reactions  . Avelox [Moxifloxacin Hcl In Nacl] Shortness Of Breath and Swelling  . Moxifloxacin Other (See Comments), Shortness Of Breath and Swelling    other  . Aricept [Donepezil Hcl] Diarrhea  . Codeine Swelling       . Donepezil Diarrhea  . Garlic Diarrhea   . Latex Hives, Itching, Rash and Other (See Comments)    Watery blisters   . Onion Diarrhea  . Sulfa Drugs Cross Reactors Swelling  . Duloxetine Rash  . Polysporin [Bacitracin-Polymyxin B] Other (See Comments)    other   Follow-up Information    Deland Pretty, MD Follow up.   Specialty:  Internal Medicine Contact information: [address] [place] [address] [phone]        [facility].   Why:  [facility] Contact information: [facility] Westley Gambles, MD Follow up on 12/23/2017.   Specialty:  Cardiology Why:  at 2:00PM with Dr. Dyann Kief information: [address] [place] [address] [phone]            The results of significant diagnostics from this hospitalization (including imaging, microbiology, ancillary and laboratory) are listed below for reference.    Significant Diagnostic Studies: Dg Chest 2 View  Result Date: 12/13/2017 CLINICAL DATA:  Shortness of breath EXAM: CHEST - 2 VIEW COMPARISON:  12/10/2017 chest radiograph FINDINGS: Small left pleural effusion with associated atelectasis. Mild cardiomegaly with calcific aortic atherosclerosis. No pneumothorax. Unchanged diffuse interstitial prominence. IMPRESSION: Small left pleural effusion with associated left basilar atelectasis. Electronically Signed   By: Ulyses Jarred M.D.   On: 12/13/2017 21:58   Dg Chest 2 View  Result Date: 12/06/2017 CLINICAL DATA:  Shortness of breath EXAM: CHEST - 2 VIEW COMPARISON:  August 02, 2016 FINDINGS: There is no edema or consolidation. Heart is borderline enlarged with pulmonary vascularity within normal limits. There is aortic atherosclerosis. No adenopathy. No evident bone lesions. IMPRESSION: Aortic atherosclerosis. Heart borderline enlarged. No edema or consolidation. Electronically Signed   By: Lowella Grip III M.D.   On: 12/06/2017 11:06   Ct Head Wo Contrast  Result Date:  12/06/2017 CLINICAL DATA:  [place] last night at assisted living facility, no loss of consciousness; having chest pain EXAM: CT HEAD WITHOUT CONTRAST CT CERVICAL SPINE WITHOUT CONTRAST TECHNIQUE: Multidetector CT imaging of the head and cervical spine was performed following the standard protocol without intravenous contrast. Multiplanar CT image reconstructions of the cervical spine were also generated. COMPARISON:  CT head 06/04/2016 FINDINGS: CT HEAD FINDINGS Brain: Generalized atrophy. Normal ventricular morphology. No midline shift or mass effect. Minimal small vessel chronic ischemic changes of deep cerebral white matter. No intracranial hemorrhage, mass lesion or evidence of acute infarction. No extra-axial fluid collections. Vascular: Mild atherosclerotic calcification within internal carotid arteries at skull base Skull: Intact Sinuses/Orbits: Partial opacification of ethmoid air cells and frontal sinus. Small air-fluid levels in maxillary sinuses bilaterally. Other: N/Erion Weightman CT CERVICAL SPINE FINDINGS Alignment: Mild anterolisthesis at C4-C5 with minimal retrolisthesis at C5-C6 and C6-C7 Skull base and vertebrae: Diffuse osseous demineralization. Visualized skull base intact. Multilevel advanced facet degenerative changes. Advanced degenerative disc disease changes at C5-C6 and C6-C7 with disc space narrowing and endplate spur formation. Vertebral body heights maintained without fracture or bone destruction. Soft tissues and spinal canal: Prevertebral soft tissues normal thickness. Disc  levels:  Mild pseudo disc at C4-C5 Upper chest: Emphysematous changes at lung apices Other: N/Dolores Mcgovern IMPRESSION: Atrophy with minimal small vessel chronic ischemic changes of deep cerebral white matter. No acute intracranial abnormalities. Scattered sinus disease changes as above including small air-fluid levels in the maxillary sinuses. Degenerative disc and facet disease changes of the cervical spine. No acute cervical spine  abnormalities. Electronically Signed   By: Lavonia Dana M.D.   On: 12/06/2017 12:35   Ct Cervical Spine Wo Contrast  Result Date: 12/06/2017 CLINICAL DATA:  [place] last night at assisted living facility, no loss of consciousness; having chest pain EXAM: CT HEAD WITHOUT CONTRAST CT CERVICAL SPINE WITHOUT CONTRAST TECHNIQUE: Multidetector CT imaging of the head and cervical spine was performed following the standard protocol without intravenous contrast. Multiplanar CT image reconstructions of the cervical spine were also generated. COMPARISON:  CT head 06/04/2016 FINDINGS: CT HEAD FINDINGS Brain: Generalized atrophy. Normal ventricular morphology. No midline shift or mass effect. Minimal small vessel chronic ischemic changes of deep cerebral white matter. No intracranial hemorrhage, mass lesion or evidence of acute infarction. No extra-axial fluid collections. Vascular: Mild atherosclerotic calcification within internal carotid arteries at skull base Skull: Intact Sinuses/Orbits: Partial opacification of ethmoid air cells and frontal sinus. Small air-fluid levels in maxillary sinuses bilaterally. Other: N/Yuto Cajuste CT CERVICAL SPINE FINDINGS Alignment: Mild anterolisthesis at C4-C5 with minimal retrolisthesis at C5-C6 and C6-C7 Skull base and vertebrae: Diffuse osseous demineralization. Visualized skull base intact. Multilevel advanced facet degenerative changes. Advanced degenerative disc disease changes at C5-C6 and C6-C7 with disc space narrowing and endplate spur formation. Vertebral body heights maintained without fracture or bone destruction. Soft tissues and spinal canal: Prevertebral soft tissues normal thickness. Disc levels:  Mild pseudo disc at C4-C5 Upper chest: Emphysematous changes at lung apices Other: N/Elias Bordner IMPRESSION: Atrophy with minimal small vessel chronic ischemic changes of deep cerebral white matter. No acute intracranial abnormalities. Scattered sinus disease changes as above including small air-fluid  levels in the maxillary sinuses. Degenerative disc and facet disease changes of the cervical spine. No acute cervical spine abnormalities. Electronically Signed   By: Lavonia Dana M.D.   On: 12/06/2017 12:35   Dg Chest Port 1 View  Result Date: 12/17/2017 CLINICAL DATA:  Increased shortness of breath EXAM: PORTABLE CHEST 1 VIEW COMPARISON:  12/15/2017 FINDINGS: Cardiomegaly with vascular congestion. Increasing interstitial prominence, likely interstitial edema. Bibasilar atelectasis with layering bilateral effusions. IMPRESSION: Increasing interstitial prominence throughout the lungs, likely interstitial edema. Small bilateral layering effusions with bibasilar atelectasis. Electronically Signed   By: Rolm Baptise M.D.   On: 12/17/2017 10:17   Dg Chest Port 1 View  Result Date: 12/15/2017 CLINICAL DATA:  Hypoxia.  Shortness of breath. EXAM: PORTABLE CHEST 1 VIEW COMPARISON:  12/13/2017 FINDINGS: There is lung base opacity consistent with small effusions and mild atelectasis. Chronic bronchitic changes are noted at both lung bases. No evidence of pneumonia or pulmonary edema. No pneumothorax. No significant change from the previous day's study. IMPRESSION: 1. No convincing pneumonia or pulmonary edema. 2. Small pleural effusions with mild lung base atelectasis. Chronic lung base bronchitic changes. Electronically Signed   By: Lajean Manes M.D.   On: 12/15/2017 17:44   Dg Chest Port 1 View  Result Date: 12/10/2017 CLINICAL DATA:  Acute respiratory failure EXAM: PORTABLE CHEST 1 VIEW COMPARISON:  Portable exam 1637 hours compared to 12/07/2017 FINDINGS: Enlargement of cardiac silhouette with mitral annular calcification. Mediastinal contours and pulmonary vascularity normal. Atherosclerotic calcification aorta. Improved pulmonary edema since  previous exam. Mild peribronchial thickening. No gross pleural effusion or pneumothorax. Bones demineralized. IMPRESSION: Enlargement of cardiac silhouette. Bronchitic  changes with improved pulmonary edema since 12/07/2017. Electronically Signed   By: Lavonia Dana M.D.   On: 12/10/2017 17:18   Dg Chest Port 1 View  Result Date: 12/07/2017 CLINICAL DATA:  Shortness of breath and chest pain for 1 day. EXAM: PORTABLE CHEST 1 VIEW COMPARISON:  Chest radiograph December 06, 2017 FINDINGS: The cardiac silhouette is mildly enlarged and unchanged. Calcified aortic knob. Similar fullness of the hila with new interstitial prominence. Mitral annular calcification. Small pleural effusions. No focal consolidation. Biapical pleuroparenchymal scarring with calcification. No pneumothorax. Osteopenia. Mild thoracolumbar levoscoliosis. IMPRESSION: Increasing interstitial prominence concerning for pulmonary edema with small pleural effusions. Stable cardiomegaly. Aortic Atherosclerosis (ICD10-I70.0). Electronically Signed   By: Elon Alas M.D.   On: 12/07/2017 16:27    Microbiology: Recent Results (from the past 240 hour(s))  MRSA PCR Screening     Status: None   Collection Time: 12/11/17  5:04 PM  Result Value Ref Range Status   MRSA by PCR NEGATIVE NEGATIVE Final    Comment:        The GeneXpert MRSA Assay (FDA approved for NASAL specimens only), is one component of [facility] comprehensive MRSA colonization surveillance program. It is not intended to diagnose MRSA infection nor to guide or monitor treatment for MRSA infections. Performed at [facility], [place] [address] [place], [place] [address]      Labs: Basic Metabolic Panel: Recent Labs  Lab 12/13/17 0317 12/14/17 0236 12/15/17 0217 12/16/17 0142 12/17/17 0343  NA 138 140 138 138 140  K 5.1 4.2 4.2 4.2 4.0  CL 103 105 103 104 101  CO2 27 28 28 27 30   GLUCOSE 108* 92 87 87 106*  BUN 29* 21* 20 17 14   CREATININE 0.83 0.75 0.76 0.78 0.74  CALCIUM 8.9 9.1 8.7* 8.6* 9.0  MG  --  1.7 1.8 1.5* 1.7   Liver Function Tests: Recent Labs  Lab 12/17/17 0343  AST 22  ALT 19  ALKPHOS 56  BILITOT 0.5   PROT 6.2*  ALBUMIN 3.0*   Recent Labs  Lab 12/17/17 0343  LIPASE 27   No results for input(s): AMMONIA in the last 168 hours. CBC: Recent Labs  Lab 12/13/17 0317 12/14/17 0236 12/15/17 0217 12/16/17 0142 12/17/17 0343  WBC 8.0 7.1 6.3 8.2 8.7  NEUTROABS  --   --  4.0  --   --   HGB 9.2* 9.8* 9.8* 9.4* 10.3*  HCT 30.0* 32.3* 32.3* 31.0* 33.4*  MCV 86.7 88.0 87.5 87.6 86.8  PLT 271 309 321 340 353   Cardiac Enzymes: No results for input(s): CKTOTAL, CKMB, CKMBINDEX, TROPONINI in the last 168 hours. BNP: BNP (last 3 results) Recent Labs    12/07/17 0944 12/08/17 0136 12/09/17 0226  BNP 953.0* 856.5* 933.3*    ProBNP (last 3 results) No results for input(s): PROBNP in the last 8760 hours.  CBG: Recent Labs  Lab 12/17/17 1210  GLUCAP 100*       Signed:  Fayrene Helper MD.  [facility] 12/17/2017, 1:22 PM

## 2017-12-17 NOTE — Progress Notes (Signed)
Patient will discharge to [facility] Anticipated discharge date: 4/30 Family notified: son Transportation by son- pick up at 5:30pm  CSW signing off.  Jorge Ny, LCSW Clinical Social Worker [phone]

## 2017-12-17 NOTE — Progress Notes (Signed)
Called report to RN at [facility]. Answered all questions and informed [facility] RN, pt would be arriving around 6 pm via car driven by son.

## 2017-12-17 NOTE — Progress Notes (Signed)
Nutrition Follow Up  DOCUMENTATION CODES:   Not applicable  INTERVENTION:    Continue Boost Breeze po TID, each supplement provides 250 kcal and 9 grams of protein   NUTRITION DIAGNOSIS:   Increased nutrient needs related to acute illness as evidenced by estimated needs, ongoing  GOAL:   Patient will meet greater than or equal to 90% of their needs, progressing  MONITOR:   PO intake, Supplement acceptance, Diet advancement, Labs, Weight trends, I & O's  ASSESSMENT:   [name] is an 84 y.o. female with a PMH of CAD, PAF on chronic Coumadin, iron deficiency anemia, hypertension, hyperlipidemia and aortic stenosis who was admitted with a chief complaint of a fall in the setting of a one-month history of black stools and progressive shortness of breath.  4/23- s/p upper GI- revealed hiatal hernia a benign appearing esophageal stenosis, exam otherwise normal  RD briefly spoke with pt before her telephone rang. Reports she consumed about 1/3 of her breakfast this am. Her appetite is rather poor but states "this is nothing new".  Drank some Ensure Enlive last night but made her nauseated. Has Boost Breeze ordered. Did not mention she's received this. Labs and medications reviewed. Receiving MVI daily.  Diet Order:   Diet Order           DIET SOFT Room service appropriate? Yes; Fluid consistency: Thin  Diet effective now         EDUCATION NEEDS:   No education needs have been identified at this time  Skin:  Skin Assessment: Reviewed RN Assessment  Last BM:  4/30  Height:   Ht Readings from Last 1 Encounters:  12/10/17 5\' 4"  (1.626 m)   Weight:   Wt Readings from Last 1 Encounters:  12/17/17 113 lb 12.1 oz (51.6 kg)   Ideal Body Weight:  54.54 kg  BMI:  Body mass index is 19.53 kg/m.  Estimated Nutritional Needs:   Kcal:  1250-1500  Protein:  65-75 gm  Fluid:  >/= 1.5 L  [name], RD, LDN Pager #: [phone] After-Hours Pager #:  [phone]

## 2017-12-17 NOTE — Plan of Care (Signed)
Discussed plan of care with patient and her son.  Patient is scheduled for discharge tomorrow.  Son inquired about time of discharge.  I stated I would call him if and when I found out the scheduled time of discharge.  Patient is returning to [facility].

## 2017-12-18 ENCOUNTER — Non-Acute Institutional Stay (SKILLED_NURSING_FACILITY): Payer: Medicare Other | Admitting: Internal Medicine

## 2017-12-18 ENCOUNTER — Encounter: Payer: Self-pay | Admitting: Internal Medicine

## 2017-12-18 DIAGNOSIS — R5381 Other malaise: Secondary | ICD-10-CM | POA: Diagnosis not present

## 2017-12-18 DIAGNOSIS — L989 Disorder of the skin and subcutaneous tissue, unspecified: Secondary | ICD-10-CM | POA: Diagnosis not present

## 2017-12-18 DIAGNOSIS — G63 Polyneuropathy in diseases classified elsewhere: Secondary | ICD-10-CM | POA: Diagnosis not present

## 2017-12-18 DIAGNOSIS — I480 Paroxysmal atrial fibrillation: Secondary | ICD-10-CM | POA: Diagnosis not present

## 2017-12-18 DIAGNOSIS — K922 Gastrointestinal hemorrhage, unspecified: Secondary | ICD-10-CM | POA: Diagnosis not present

## 2017-12-18 DIAGNOSIS — R269 Unspecified abnormalities of gait and mobility: Secondary | ICD-10-CM | POA: Diagnosis not present

## 2017-12-18 DIAGNOSIS — I272 Pulmonary hypertension, unspecified: Secondary | ICD-10-CM | POA: Diagnosis not present

## 2017-12-18 DIAGNOSIS — M159 Polyosteoarthritis, unspecified: Secondary | ICD-10-CM

## 2017-12-18 DIAGNOSIS — R4189 Other symptoms and signs involving cognitive functions and awareness: Secondary | ICD-10-CM

## 2017-12-18 DIAGNOSIS — D62 Acute posthemorrhagic anemia: Secondary | ICD-10-CM

## 2017-12-18 DIAGNOSIS — I10 Essential (primary) hypertension: Secondary | ICD-10-CM
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
## 2017-12-23 ENCOUNTER — Ambulatory Visit (INDEPENDENT_AMBULATORY_CARE_PROVIDER_SITE_OTHER): Payer: Medicare Other | Admitting: Cardiovascular Disease

## 2017-12-23 ENCOUNTER — Encounter: Payer: Self-pay | Admitting: Cardiovascular Disease

## 2017-12-23 VITALS — BP 152/78 | HR 60 | Ht 64.0 in | Wt 111.8 lb

## 2017-12-23 DIAGNOSIS — I480 Paroxysmal atrial fibrillation: Secondary | ICD-10-CM | POA: Diagnosis not present

## 2017-12-23 DIAGNOSIS — Z79899 Other long term (current) drug therapy: Secondary | ICD-10-CM | POA: Diagnosis not present

## 2017-12-23 DIAGNOSIS — I272 Pulmonary hypertension, unspecified: Secondary | ICD-10-CM | POA: Diagnosis not present

## 2017-12-23 DIAGNOSIS — D649 Anemia, unspecified: Secondary | ICD-10-CM | POA: Diagnosis not present

## 2017-12-23 DIAGNOSIS — I10 Essential (primary) hypertension: Secondary | ICD-10-CM | POA: Diagnosis not present

## 2017-12-23 LAB — COMPLETE METABOLIC PANEL WITH GFR
ALT: 12
AST: 20
Albumin: 3.3
Alkaline Phosphatase: 58
BUN: 10 (ref 4–21)
Creat: 0.8
EGFR (Non-African Amer.): 64
Glucose: 68
Potassium: 4.4
Protein: 6
Sodium: 140
Total Bilirubin: 0.3

## 2017-12-23 LAB — CBC
HCT: 34.2
HGB: 10.8
MCV: 85.3
WBC: 6.1
platelet count: 347
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
## 2017-12-23 NOTE — Patient Instructions (Signed)
Medication Instructions:  Continue amiodarone 200 mg two times daily for 1 month  June 6th-decrease amiodarone to 300 mg daily  Labwork: Please return for labs in 1 month (CMET, CBC, TSH)  Our in office lab hours are Monday-Friday 8:00-4:00, closed for lunch 12:45-1:45 pm.  No appointment needed.   Follow-Up: 5-6 weeks with Dr. Claiborne Billings  Any Other Special Instructions Will Be Listed Below (If Applicable).     If you need a refill on your cardiac medications before your next appointment, please call your pharmacy.

## 2017-12-24 ENCOUNTER — Encounter: Payer: Self-pay | Admitting: *Deleted

## 2017-12-25 ENCOUNTER — Encounter: Payer: Self-pay | Admitting: Cardiovascular Disease

## 2017-12-25 DIAGNOSIS — L570 Actinic keratosis: Secondary | ICD-10-CM | POA: Diagnosis not present

## 2017-12-25 DIAGNOSIS — L814 Other melanin hyperpigmentation: Secondary | ICD-10-CM | POA: Diagnosis not present

## 2017-12-25 DIAGNOSIS — Z85828 Personal history of other malignant neoplasm of skin: Secondary | ICD-10-CM | POA: Diagnosis not present

## 2017-12-25 DIAGNOSIS — D485 Neoplasm of uncertain behavior of skin: Secondary | ICD-10-CM | POA: Diagnosis not present

## 2018-01-06 ENCOUNTER — Non-Acute Institutional Stay (SKILLED_NURSING_FACILITY): Payer: Medicare Other | Admitting: Family

## 2018-01-06 ENCOUNTER — Encounter: Payer: Self-pay | Admitting: Family

## 2018-01-06 DIAGNOSIS — G63 Polyneuropathy in diseases classified elsewhere: Secondary | ICD-10-CM

## 2018-01-06 DIAGNOSIS — D509 Iron deficiency anemia, unspecified: Secondary | ICD-10-CM | POA: Diagnosis not present

## 2018-01-06 DIAGNOSIS — I480 Paroxysmal atrial fibrillation: Secondary | ICD-10-CM | POA: Diagnosis not present

## 2018-01-06 DIAGNOSIS — E782 Mixed hyperlipidemia: Secondary | ICD-10-CM

## 2018-01-06 DIAGNOSIS — R269 Unspecified abnormalities of gait and mobility: Secondary | ICD-10-CM

## 2018-01-06 DIAGNOSIS — L989 Disorder of the skin and subcutaneous tissue, unspecified: Secondary | ICD-10-CM

## 2018-01-06 DIAGNOSIS — K922 Gastrointestinal hemorrhage, unspecified: Secondary | ICD-10-CM

## 2018-01-06 DIAGNOSIS — R4189 Other symptoms and signs involving cognitive functions and awareness: Secondary | ICD-10-CM

## 2018-01-07 ENCOUNTER — Other Ambulatory Visit: Payer: Self-pay | Admitting: Cardiovascular Disease

## 2018-01-07 DIAGNOSIS — M6281 Muscle weakness (generalized): Secondary | ICD-10-CM | POA: Diagnosis not present

## 2018-01-07 DIAGNOSIS — R41841 Cognitive communication deficit: Secondary | ICD-10-CM | POA: Diagnosis not present

## 2018-01-07 DIAGNOSIS — I480 Paroxysmal atrial fibrillation: Secondary | ICD-10-CM | POA: Diagnosis not present

## 2018-01-07 DIAGNOSIS — R29898 Other symptoms and signs involving the musculoskeletal system: Secondary | ICD-10-CM | POA: Diagnosis not present

## 2018-01-07 NOTE — Telephone Encounter (Signed)
REFILL 

## 2018-01-08 DIAGNOSIS — R29898 Other symptoms and signs involving the musculoskeletal system: Secondary | ICD-10-CM | POA: Diagnosis not present

## 2018-01-08 DIAGNOSIS — R41841 Cognitive communication deficit: Secondary | ICD-10-CM | POA: Diagnosis not present

## 2018-01-08 DIAGNOSIS — I480 Paroxysmal atrial fibrillation: Secondary | ICD-10-CM | POA: Diagnosis not present

## 2018-01-08 DIAGNOSIS — M6281 Muscle weakness (generalized): Secondary | ICD-10-CM | POA: Diagnosis not present

## 2018-01-09 DIAGNOSIS — R29898 Other symptoms and signs involving the musculoskeletal system: Secondary | ICD-10-CM | POA: Diagnosis not present

## 2018-01-09 DIAGNOSIS — R41841 Cognitive communication deficit: Secondary | ICD-10-CM | POA: Diagnosis not present

## 2018-01-09 DIAGNOSIS — M6281 Muscle weakness (generalized): Secondary | ICD-10-CM | POA: Diagnosis not present

## 2018-01-09 DIAGNOSIS — I480 Paroxysmal atrial fibrillation: Secondary | ICD-10-CM | POA: Diagnosis not present
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
## 2018-01-10 DIAGNOSIS — I4891 Unspecified atrial fibrillation: Secondary | ICD-10-CM | POA: Diagnosis not present

## 2018-01-10 DIAGNOSIS — R41841 Cognitive communication deficit: Secondary | ICD-10-CM | POA: Diagnosis not present

## 2018-01-10 DIAGNOSIS — E559 Vitamin D deficiency, unspecified: Secondary | ICD-10-CM | POA: Diagnosis not present

## 2018-01-10 DIAGNOSIS — I10 Essential (primary) hypertension: Secondary | ICD-10-CM | POA: Diagnosis not present

## 2018-01-10 DIAGNOSIS — E538 Deficiency of other specified B group vitamins: Secondary | ICD-10-CM | POA: Diagnosis not present

## 2018-01-10 DIAGNOSIS — I480 Paroxysmal atrial fibrillation: Secondary | ICD-10-CM | POA: Diagnosis not present

## 2018-01-10 DIAGNOSIS — Z09 Encounter for follow-up examination after completed treatment for conditions other than malignant neoplasm: Secondary | ICD-10-CM | POA: Diagnosis not present

## 2018-01-10 DIAGNOSIS — G629 Polyneuropathy, unspecified: Secondary | ICD-10-CM | POA: Diagnosis not present

## 2018-01-10 DIAGNOSIS — M6281 Muscle weakness (generalized): Secondary | ICD-10-CM | POA: Diagnosis not present

## 2018-01-10 DIAGNOSIS — Z79899 Other long term (current) drug therapy: Secondary | ICD-10-CM | POA: Diagnosis not present

## 2018-01-10 DIAGNOSIS — R29898 Other symptoms and signs involving the musculoskeletal system: Secondary | ICD-10-CM | POA: Diagnosis not present

## 2018-01-14 DIAGNOSIS — M6281 Muscle weakness (generalized): Secondary | ICD-10-CM | POA: Diagnosis not present

## 2018-01-14 DIAGNOSIS — I480 Paroxysmal atrial fibrillation: Secondary | ICD-10-CM | POA: Diagnosis not present

## 2018-01-14 DIAGNOSIS — R41841 Cognitive communication deficit: Secondary | ICD-10-CM | POA: Diagnosis not present

## 2018-01-14 DIAGNOSIS — R29898 Other symptoms and signs involving the musculoskeletal system: Secondary | ICD-10-CM | POA: Diagnosis not present

## 2018-01-15 DIAGNOSIS — R41841 Cognitive communication deficit: Secondary | ICD-10-CM | POA: Diagnosis not present

## 2018-01-15 DIAGNOSIS — R29898 Other symptoms and signs involving the musculoskeletal system: Secondary | ICD-10-CM | POA: Diagnosis not present

## 2018-01-15 DIAGNOSIS — M6281 Muscle weakness (generalized): Secondary | ICD-10-CM | POA: Diagnosis not present

## 2018-01-15 DIAGNOSIS — I480 Paroxysmal atrial fibrillation: Secondary | ICD-10-CM | POA: Diagnosis not present

## 2018-01-16 DIAGNOSIS — I480 Paroxysmal atrial fibrillation: Secondary | ICD-10-CM | POA: Diagnosis not present

## 2018-01-16 DIAGNOSIS — R29898 Other symptoms and signs involving the musculoskeletal system: Secondary | ICD-10-CM | POA: Diagnosis not present

## 2018-01-16 DIAGNOSIS — R41841 Cognitive communication deficit: Secondary | ICD-10-CM | POA: Diagnosis not present

## 2018-01-16 DIAGNOSIS — M6281 Muscle weakness (generalized): Secondary | ICD-10-CM | POA: Diagnosis not present

## 2018-01-17 DIAGNOSIS — I480 Paroxysmal atrial fibrillation: Secondary | ICD-10-CM | POA: Diagnosis not present

## 2018-01-17 DIAGNOSIS — R29898 Other symptoms and signs involving the musculoskeletal system: Secondary | ICD-10-CM | POA: Diagnosis not present

## 2018-01-17 DIAGNOSIS — M6281 Muscle weakness (generalized): Secondary | ICD-10-CM | POA: Diagnosis not present

## 2018-01-17 DIAGNOSIS — R41841 Cognitive communication deficit: Secondary | ICD-10-CM | POA: Diagnosis not present

## 2018-01-20 DIAGNOSIS — M6281 Muscle weakness (generalized): Secondary | ICD-10-CM | POA: Diagnosis not present

## 2018-01-20 DIAGNOSIS — I480 Paroxysmal atrial fibrillation: Secondary | ICD-10-CM | POA: Diagnosis not present

## 2018-01-20 DIAGNOSIS — R29898 Other symptoms and signs involving the musculoskeletal system: Secondary | ICD-10-CM | POA: Diagnosis not present

## 2018-01-20 DIAGNOSIS — R41841 Cognitive communication deficit: Secondary | ICD-10-CM | POA: Diagnosis not present

## 2018-01-21 DIAGNOSIS — R41841 Cognitive communication deficit: Secondary | ICD-10-CM | POA: Diagnosis not present

## 2018-01-21 DIAGNOSIS — I480 Paroxysmal atrial fibrillation: Secondary | ICD-10-CM | POA: Diagnosis not present

## 2018-01-21 DIAGNOSIS — M6281 Muscle weakness (generalized): Secondary | ICD-10-CM | POA: Diagnosis not present

## 2018-01-21 DIAGNOSIS — R29898 Other symptoms and signs involving the musculoskeletal system: Secondary | ICD-10-CM | POA: Diagnosis not present

## 2018-01-23 DIAGNOSIS — I4891 Unspecified atrial fibrillation: Secondary | ICD-10-CM | POA: Diagnosis not present

## 2018-01-23 DIAGNOSIS — I480 Paroxysmal atrial fibrillation: Secondary | ICD-10-CM | POA: Diagnosis not present

## 2018-01-23 DIAGNOSIS — D649 Anemia, unspecified: Secondary | ICD-10-CM | POA: Diagnosis not present

## 2018-01-23 DIAGNOSIS — I38 Endocarditis, valve unspecified: Secondary | ICD-10-CM | POA: Diagnosis not present

## 2018-01-23 DIAGNOSIS — Z79899 Other long term (current) drug therapy: Secondary | ICD-10-CM | POA: Diagnosis not present

## 2018-01-24 LAB — TSH: TSH: 3.58 u[IU]/mL (ref 0.450–4.500)

## 2018-01-24 LAB — COMPREHENSIVE METABOLIC PANEL
ALT: 21 IU/L (ref 0–32)
AST: 25 IU/L (ref 0–40)
Albumin/Globulin Ratio: 1.4 (ref 1.2–2.2)
Albumin: 4.2 g/dL (ref 3.2–4.6)
Alkaline Phosphatase: 85 IU/L (ref 39–117)
BUN/Creatinine Ratio: 23 (ref 12–28)
BUN: 19 mg/dL (ref 10–36)
Bilirubin Total: 0.3 mg/dL (ref 0.0–1.2)
CO2: 25 mmol/L (ref 20–29)
Calcium: 9.6 mg/dL (ref 8.7–10.3)
Chloride: 98 mmol/L (ref 96–106)
Creatinine, Ser: 0.84 mg/dL (ref 0.57–1.00)
GFR calc Af Amer: 69 mL/min/{1.73_m2} (ref >59)
GFR calc non Af Amer: 60 mL/min/{1.73_m2} (ref >59)
Globulin, Total: 2.9 g/dL (ref 1.5–4.5)
Glucose: 75 mg/dL (ref 65–99)
Potassium: 4.8 mmol/L (ref 3.5–5.2)
Sodium: 139 mmol/L (ref 134–144)
Total Protein: 7.1 g/dL (ref 6.0–8.5)

## 2018-01-24 LAB — CBC
Hematocrit: 39.1 % (ref 34.0–46.6)
Hemoglobin: 13 g/dL (ref 11.1–15.9)
MCH: 28.6 pg (ref 26.6–33.0)
MCHC: 33.2 g/dL (ref 31.5–35.7)
MCV: 86 fL (ref 79–97)
Platelets: 278 10*3/uL (ref 150–450)
RBC: 4.55 x10E6/uL (ref 3.77–5.28)
RDW: 18.4 % — ABNORMAL HIGH (ref 12.3–15.4)
WBC: 6.3 10*3/uL (ref 3.4–10.8)

## 2018-01-27 ENCOUNTER — Telehealth: Payer: Self-pay | Admitting: Cardiovascular Disease

## 2018-01-27 DIAGNOSIS — I480 Paroxysmal atrial fibrillation: Secondary | ICD-10-CM | POA: Diagnosis not present

## 2018-01-27 DIAGNOSIS — R29898 Other symptoms and signs involving the musculoskeletal system: Secondary | ICD-10-CM | POA: Diagnosis not present

## 2018-01-27 DIAGNOSIS — R41841 Cognitive communication deficit: Secondary | ICD-10-CM | POA: Diagnosis not present

## 2018-01-27 DIAGNOSIS — M6281 Muscle weakness (generalized): Secondary | ICD-10-CM | POA: Diagnosis not present

## 2018-01-27 NOTE — Telephone Encounter (Signed)
Spoke with pt, she is feeling like the amiodarone maybe causing her eye sight to worsen and she feels shaky. She is currently taking 200 mg twice daily. Advised patient to reduce to 200 mg once daily and will make dr Claiborne Billings aware. Pt agreed with this plan.

## 2018-01-27 NOTE — Telephone Encounter (Signed)
New Message:    Pt says she feels so shaky,more than usual, eyesight is worse. She thinks it might be coming from her new medicine(Amiodarone)she is not sure.

## 2018-01-27 NOTE — Telephone Encounter (Signed)
agree

## 2018-01-28 DIAGNOSIS — R41841 Cognitive communication deficit: Secondary | ICD-10-CM | POA: Diagnosis not present

## 2018-01-28 DIAGNOSIS — R29898 Other symptoms and signs involving the musculoskeletal system: Secondary | ICD-10-CM | POA: Diagnosis not present

## 2018-01-28 DIAGNOSIS — I480 Paroxysmal atrial fibrillation: Secondary | ICD-10-CM | POA: Diagnosis not present

## 2018-01-28 DIAGNOSIS — M6281 Muscle weakness (generalized): Secondary | ICD-10-CM | POA: Diagnosis not present

## 2018-01-28 MED ORDER — AMIODARONE HCL 200 MG PO TABS: 200.0000 mg | ORAL_TABLET | Freq: Every day | ORAL | 0 refills | Status: DC

## 2018-01-28 NOTE — Telephone Encounter (Signed)
Left message for patient of dr kelly's recommendations. 

## 2018-01-29 DIAGNOSIS — Z85828 Personal history of other malignant neoplasm of skin: Secondary | ICD-10-CM | POA: Diagnosis not present

## 2018-01-29 DIAGNOSIS — C44329 Squamous cell carcinoma of skin of other parts of face: Secondary | ICD-10-CM | POA: Diagnosis not present

## 2018-01-30 DIAGNOSIS — M6281 Muscle weakness (generalized): Secondary | ICD-10-CM | POA: Diagnosis not present

## 2018-01-30 DIAGNOSIS — R29898 Other symptoms and signs involving the musculoskeletal system: Secondary | ICD-10-CM | POA: Diagnosis not present

## 2018-01-30 DIAGNOSIS — R41841 Cognitive communication deficit: Secondary | ICD-10-CM | POA: Diagnosis not present

## 2018-01-30 DIAGNOSIS — I480 Paroxysmal atrial fibrillation: Secondary | ICD-10-CM | POA: Diagnosis not present

## 2018-02-03 DIAGNOSIS — I480 Paroxysmal atrial fibrillation: Secondary | ICD-10-CM | POA: Diagnosis not present

## 2018-02-03 DIAGNOSIS — R29898 Other symptoms and signs involving the musculoskeletal system: Secondary | ICD-10-CM | POA: Diagnosis not present

## 2018-02-03 DIAGNOSIS — M6281 Muscle weakness (generalized): Secondary | ICD-10-CM | POA: Diagnosis not present

## 2018-02-03 DIAGNOSIS — R41841 Cognitive communication deficit: Secondary | ICD-10-CM | POA: Diagnosis not present

## 2018-02-06 DIAGNOSIS — I480 Paroxysmal atrial fibrillation: Secondary | ICD-10-CM | POA: Diagnosis not present

## 2018-02-06 DIAGNOSIS — R41841 Cognitive communication deficit: Secondary | ICD-10-CM | POA: Diagnosis not present

## 2018-02-06 DIAGNOSIS — R29898 Other symptoms and signs involving the musculoskeletal system: Secondary | ICD-10-CM | POA: Diagnosis not present

## 2018-02-06 DIAGNOSIS — M6281 Muscle weakness (generalized): Secondary | ICD-10-CM | POA: Diagnosis not present

## 2018-02-07 DIAGNOSIS — R29898 Other symptoms and signs involving the musculoskeletal system: Secondary | ICD-10-CM | POA: Diagnosis not present

## 2018-02-07 DIAGNOSIS — R41841 Cognitive communication deficit: Secondary | ICD-10-CM | POA: Diagnosis not present

## 2018-02-07 DIAGNOSIS — M6281 Muscle weakness (generalized): Secondary | ICD-10-CM | POA: Diagnosis not present

## 2018-02-07 DIAGNOSIS — I480 Paroxysmal atrial fibrillation: Secondary | ICD-10-CM | POA: Diagnosis not present

## 2018-02-10 DIAGNOSIS — M6281 Muscle weakness (generalized): Secondary | ICD-10-CM | POA: Diagnosis not present

## 2018-02-10 DIAGNOSIS — I480 Paroxysmal atrial fibrillation: Secondary | ICD-10-CM | POA: Diagnosis not present

## 2018-02-10 DIAGNOSIS — R29898 Other symptoms and signs involving the musculoskeletal system: Secondary | ICD-10-CM | POA: Diagnosis not present

## 2018-02-10 DIAGNOSIS — R41841 Cognitive communication deficit: Secondary | ICD-10-CM | POA: Diagnosis not present

## 2018-02-12 DIAGNOSIS — I480 Paroxysmal atrial fibrillation: Secondary | ICD-10-CM | POA: Diagnosis not present

## 2018-02-12 DIAGNOSIS — R29898 Other symptoms and signs involving the musculoskeletal system: Secondary | ICD-10-CM | POA: Diagnosis not present

## 2018-02-12 DIAGNOSIS — M6281 Muscle weakness (generalized): Secondary | ICD-10-CM | POA: Diagnosis not present

## 2018-02-12 DIAGNOSIS — R41841 Cognitive communication deficit: Secondary | ICD-10-CM | POA: Diagnosis not present

## 2018-02-13 ENCOUNTER — Encounter: Payer: Self-pay | Admitting: Cardiovascular Disease

## 2018-02-13 ENCOUNTER — Ambulatory Visit (INDEPENDENT_AMBULATORY_CARE_PROVIDER_SITE_OTHER): Payer: Medicare Other | Admitting: Cardiovascular Disease

## 2018-02-13 VITALS — BP 138/56 | HR 50 | Ht 62.5 in | Wt 113.0 lb

## 2018-02-13 DIAGNOSIS — I272 Pulmonary hypertension, unspecified: Secondary | ICD-10-CM | POA: Diagnosis not present

## 2018-02-13 DIAGNOSIS — I340 Nonrheumatic mitral (valve) insufficiency: Secondary | ICD-10-CM

## 2018-02-13 DIAGNOSIS — I10 Essential (primary) hypertension: Secondary | ICD-10-CM

## 2018-02-13 DIAGNOSIS — I350 Nonrheumatic aortic (valve) stenosis: Secondary | ICD-10-CM

## 2018-02-13 DIAGNOSIS — I519 Heart disease, unspecified: Secondary | ICD-10-CM

## 2018-02-13 DIAGNOSIS — I5189 Other ill-defined heart diseases: Secondary | ICD-10-CM

## 2018-02-13 DIAGNOSIS — I480 Paroxysmal atrial fibrillation: Secondary | ICD-10-CM | POA: Diagnosis not present

## 2018-02-13 DIAGNOSIS — D649 Anemia, unspecified: Secondary | ICD-10-CM

## 2018-02-13 MED ORDER — AMIODARONE HCL 200 MG PO TABS: ORAL_TABLET | ORAL | 3 refills | Status: DC

## 2018-02-13 NOTE — Patient Instructions (Signed)
Medication Instructions:  STARTING July 15th Decrease amiodarone to 100 mg (1/2 tablet) on odd days and 200 mg (1 tablet) on even days  Follow-Up: 3-4 months with Dr. Claiborne Billings  Any Other Special Instructions Will Be Listed Below (If Applicable).     If you need a refill on your cardiac medications before your next appointment, please call your pharmacy.
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
## 2018-02-15 ENCOUNTER — Encounter: Payer: Self-pay | Admitting: Cardiovascular Disease

## 2018-02-17 DIAGNOSIS — H16223 Keratoconjunctivitis sicca, not specified as Sjogren's, bilateral: Secondary | ICD-10-CM | POA: Diagnosis not present

## 2018-02-17 DIAGNOSIS — H2513 Age-related nuclear cataract, bilateral: Secondary | ICD-10-CM | POA: Diagnosis not present

## 2018-02-17 DIAGNOSIS — H10413 Chronic giant papillary conjunctivitis, bilateral: Secondary | ICD-10-CM | POA: Diagnosis not present

## 2018-02-17 DIAGNOSIS — I480 Paroxysmal atrial fibrillation: Secondary | ICD-10-CM | POA: Diagnosis not present

## 2018-02-17 DIAGNOSIS — M6281 Muscle weakness (generalized): Secondary | ICD-10-CM | POA: Diagnosis not present

## 2018-02-21 DIAGNOSIS — I480 Paroxysmal atrial fibrillation: Secondary | ICD-10-CM | POA: Diagnosis not present

## 2018-02-21 DIAGNOSIS — M6281 Muscle weakness (generalized): Secondary | ICD-10-CM | POA: Diagnosis not present

## 2018-02-24 DIAGNOSIS — I480 Paroxysmal atrial fibrillation: Secondary | ICD-10-CM | POA: Diagnosis not present

## 2018-02-24 DIAGNOSIS — M6281 Muscle weakness (generalized): Secondary | ICD-10-CM | POA: Diagnosis not present

## 2018-02-27 DIAGNOSIS — I480 Paroxysmal atrial fibrillation: Secondary | ICD-10-CM | POA: Diagnosis not present

## 2018-02-27 DIAGNOSIS — M6281 Muscle weakness (generalized): Secondary | ICD-10-CM | POA: Diagnosis not present

## 2018-03-13 DIAGNOSIS — M6281 Muscle weakness (generalized): Secondary | ICD-10-CM | POA: Diagnosis not present

## 2018-03-13 DIAGNOSIS — I480 Paroxysmal atrial fibrillation: Secondary | ICD-10-CM | POA: Diagnosis not present

## 2018-03-17 DIAGNOSIS — M6281 Muscle weakness (generalized): Secondary | ICD-10-CM | POA: Diagnosis not present

## 2018-03-17 DIAGNOSIS — I480 Paroxysmal atrial fibrillation: Secondary | ICD-10-CM | POA: Diagnosis not present

## 2018-03-20 ENCOUNTER — Other Ambulatory Visit: Payer: Self-pay

## 2018-03-20 ENCOUNTER — Emergency Department (HOSPITAL_COMMUNITY)
Admission: EM | Admit: 2018-03-20 | Discharge: 2018-03-20 | Disposition: A | Discharge status: Home / Self Care | Payer: Medicare Other | Attending: Emergency Medicine | Admitting: Emergency Medicine

## 2018-03-20 ENCOUNTER — Emergency Department (HOSPITAL_COMMUNITY): Payer: Medicare Other

## 2018-03-20 ENCOUNTER — Encounter (HOSPITAL_COMMUNITY): Payer: Self-pay | Admitting: Emergency Medicine

## 2018-03-20 DIAGNOSIS — Z79899 Other long term (current) drug therapy: Secondary | ICD-10-CM | POA: Diagnosis not present

## 2018-03-20 DIAGNOSIS — Z87891 Personal history of nicotine dependence: Secondary | ICD-10-CM | POA: Diagnosis not present

## 2018-03-20 DIAGNOSIS — Y929 Unspecified place or not applicable: Secondary | ICD-10-CM | POA: Insufficient documentation

## 2018-03-20 DIAGNOSIS — S0003XA Contusion of scalp, initial encounter: Secondary | ICD-10-CM | POA: Insufficient documentation

## 2018-03-20 DIAGNOSIS — Z91040 Latex allergy status: Secondary | ICD-10-CM | POA: Diagnosis not present

## 2018-03-20 DIAGNOSIS — I110 Hypertensive heart disease with heart failure: Secondary | ICD-10-CM | POA: Insufficient documentation

## 2018-03-20 DIAGNOSIS — W010XXA Fall on same level from slipping, tripping and stumbling without subsequent striking against object, initial encounter: Secondary | ICD-10-CM | POA: Diagnosis not present

## 2018-03-20 DIAGNOSIS — S0990XA Unspecified injury of head, initial encounter: Secondary | ICD-10-CM | POA: Diagnosis present

## 2018-03-20 DIAGNOSIS — Z85828 Personal history of other malignant neoplasm of skin: Secondary | ICD-10-CM | POA: Diagnosis not present

## 2018-03-20 DIAGNOSIS — I2510 Atherosclerotic heart disease of native coronary artery without angina pectoris: Secondary | ICD-10-CM | POA: Insufficient documentation

## 2018-03-20 DIAGNOSIS — I509 Heart failure, unspecified: Secondary | ICD-10-CM | POA: Diagnosis not present

## 2018-03-20 DIAGNOSIS — Y939 Activity, unspecified: Secondary | ICD-10-CM | POA: Insufficient documentation

## 2018-03-20 DIAGNOSIS — R0902 Hypoxemia: Secondary | ICD-10-CM | POA: Diagnosis not present

## 2018-03-20 DIAGNOSIS — I480 Paroxysmal atrial fibrillation: Secondary | ICD-10-CM | POA: Diagnosis not present

## 2018-03-20 DIAGNOSIS — M6281 Muscle weakness (generalized): Secondary | ICD-10-CM | POA: Diagnosis not present

## 2018-03-20 DIAGNOSIS — Y999 Unspecified external cause status: Secondary | ICD-10-CM | POA: Diagnosis not present

## 2018-03-20 DIAGNOSIS — I10 Essential (primary) hypertension: Secondary | ICD-10-CM | POA: Diagnosis not present

## 2018-03-20 NOTE — Discharge Instructions (Addendum)
It was our pleasure to provide your ER care today - we hope that you feel better.  Icepack/cold to sore area.   Fall precautions.   Return to ER if worse, new or severe pain, other concern.

## 2018-03-20 NOTE — ED Notes (Signed)
Patient transported to CT 
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
## 2018-03-20 NOTE — ED Notes (Signed)
Bed: WT21 Expected date:  Expected time:  Means of arrival:  Comments: 84 yo fall at pool
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
## 2018-03-26 DIAGNOSIS — W19XXXA Unspecified fall, initial encounter: Secondary | ICD-10-CM | POA: Diagnosis not present

## 2018-03-26 DIAGNOSIS — D500 Iron deficiency anemia secondary to blood loss (chronic): Secondary | ICD-10-CM | POA: Diagnosis not present

## 2018-03-27 DIAGNOSIS — I480 Paroxysmal atrial fibrillation: Secondary | ICD-10-CM | POA: Diagnosis not present

## 2018-03-27 DIAGNOSIS — M6281 Muscle weakness (generalized): Secondary | ICD-10-CM | POA: Diagnosis not present

## 2018-04-03 DIAGNOSIS — M6281 Muscle weakness (generalized): Secondary | ICD-10-CM | POA: Diagnosis not present

## 2018-04-03 DIAGNOSIS — I480 Paroxysmal atrial fibrillation: Secondary | ICD-10-CM | POA: Diagnosis not present

## 2018-04-07 DIAGNOSIS — M6281 Muscle weakness (generalized): Secondary | ICD-10-CM | POA: Diagnosis not present

## 2018-04-07 DIAGNOSIS — I480 Paroxysmal atrial fibrillation: Secondary | ICD-10-CM | POA: Diagnosis not present

## 2018-04-10 DIAGNOSIS — I480 Paroxysmal atrial fibrillation: Secondary | ICD-10-CM | POA: Diagnosis not present

## 2018-04-10 DIAGNOSIS — M6281 Muscle weakness (generalized): Secondary | ICD-10-CM | POA: Diagnosis not present

## 2018-04-17 ENCOUNTER — Ambulatory Visit (INDEPENDENT_AMBULATORY_CARE_PROVIDER_SITE_OTHER): Payer: Medicare Other | Admitting: Neurology

## 2018-04-17 ENCOUNTER — Encounter: Payer: Self-pay | Admitting: Neurology

## 2018-04-17 VITALS — BP 150/90 | HR 61 | Ht 62.5 in | Wt 116.0 lb

## 2018-04-17 DIAGNOSIS — G63 Polyneuropathy in diseases classified elsewhere: Secondary | ICD-10-CM

## 2018-04-17 DIAGNOSIS — R413 Other amnesia: Secondary | ICD-10-CM

## 2018-04-17 MED ORDER — LIDOCAINE-PRILOCAINE 2.5-2.5 % EX CREA: 1.0000 "application " | TOPICAL_CREAM | CUTANEOUS | 3 refills | Status: DC | PRN

## 2018-04-17 MED ORDER — LEVETIRACETAM 250 MG PO TABS: ORAL_TABLET | ORAL | 3 refills | Status: DC
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
## 2018-04-22 DIAGNOSIS — I480 Paroxysmal atrial fibrillation: Secondary | ICD-10-CM | POA: Diagnosis not present

## 2018-04-22 DIAGNOSIS — M6281 Muscle weakness (generalized): Secondary | ICD-10-CM | POA: Diagnosis not present

## 2018-04-24 DIAGNOSIS — M6281 Muscle weakness (generalized): Secondary | ICD-10-CM | POA: Diagnosis not present

## 2018-04-24 DIAGNOSIS — I480 Paroxysmal atrial fibrillation: Secondary | ICD-10-CM | POA: Diagnosis not present

## 2018-05-06 DIAGNOSIS — I10 Essential (primary) hypertension: Secondary | ICD-10-CM | POA: Diagnosis not present

## 2018-05-06 DIAGNOSIS — R42 Dizziness and giddiness: Secondary | ICD-10-CM | POA: Diagnosis not present

## 2018-05-06 DIAGNOSIS — D500 Iron deficiency anemia secondary to blood loss (chronic): Secondary | ICD-10-CM | POA: Diagnosis not present

## 2018-05-06 DIAGNOSIS — I4891 Unspecified atrial fibrillation: Secondary | ICD-10-CM | POA: Diagnosis not present

## 2018-05-26 ENCOUNTER — Encounter: Payer: Self-pay | Admitting: Cardiovascular Disease

## 2018-05-26 ENCOUNTER — Ambulatory Visit (INDEPENDENT_AMBULATORY_CARE_PROVIDER_SITE_OTHER): Payer: Medicare Other | Admitting: Cardiovascular Disease

## 2018-05-26 VITALS — BP 184/72 | HR 60 | Ht 62.5 in | Wt 115.8 lb

## 2018-05-26 DIAGNOSIS — I480 Paroxysmal atrial fibrillation: Secondary | ICD-10-CM

## 2018-05-26 DIAGNOSIS — I10 Essential (primary) hypertension: Secondary | ICD-10-CM | POA: Diagnosis not present

## 2018-05-26 DIAGNOSIS — I272 Pulmonary hypertension, unspecified: Secondary | ICD-10-CM | POA: Diagnosis not present

## 2018-05-26 DIAGNOSIS — R0989 Other specified symptoms and signs involving the circulatory and respiratory systems: Secondary | ICD-10-CM

## 2018-05-26 DIAGNOSIS — I350 Nonrheumatic aortic (valve) stenosis: Secondary | ICD-10-CM

## 2018-05-26 DIAGNOSIS — I340 Nonrheumatic mitral (valve) insufficiency: Secondary | ICD-10-CM

## 2018-05-26 MED ORDER — AMLODIPINE BESYLATE 2.5 MG PO TABS: 2.5000 mg | ORAL_TABLET | Freq: Every day | ORAL | 0 refills | Status: DC
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
## 2018-05-26 NOTE — Patient Instructions (Signed)
Medication Instructions:  START amlodipine 2.5 mg daily  If you need a refill on your cardiac medications before your next appointment, please call your pharmacy.   Testing/Procedures: Your physician has requested that you have an echocardiogram. Echocardiography is a painless test that uses sound waves to create images of your heart. It provides your doctor with information about the size and shape of your heart and how well your heart's chambers and valves are working. This procedure takes approximately one hour. There are no restrictions for this procedure.  Your physician has requested that you have a carotid duplex. This test is an ultrasound of the carotid arteries in your neck. It looks at blood flow through these arteries that supply the brain with blood. Allow one hour for this exam. There are no restrictions or special instructions.  Your physician has requested that you have a upper extremity arterial duplex. This test is an ultrasound of the arteries in the arms. It looks at arterial blood flow in the arms. Allow one hour for Upper Arterial scans. There are no restrictions or special instructions  Follow-Up: 12/10 at 11:40 AM with Dr. Claiborne Billings

## 2018-05-27 ENCOUNTER — Ambulatory Visit: Payer: Self-pay | Admitting: Pharmacist Clinician (PhC)/ Clinical Pharmacy Specialist

## 2018-05-27 DIAGNOSIS — Z7901 Long term (current) use of anticoagulants: Secondary | ICD-10-CM

## 2018-05-27 DIAGNOSIS — I480 Paroxysmal atrial fibrillation: Secondary | ICD-10-CM

## 2018-05-28 ENCOUNTER — Encounter: Payer: Self-pay | Admitting: Cardiovascular Disease

## 2018-06-04 ENCOUNTER — Ambulatory Visit (HOSPITAL_COMMUNITY)
Admission: RE | Admit: 2018-06-04 | Discharge: 2018-06-04 | Disposition: A | Discharge status: Home / Self Care | Payer: Medicare Other | Source: Ambulatory Visit | Attending: Internal Medicine | Admitting: Internal Medicine

## 2018-06-04 ENCOUNTER — Ambulatory Visit (HOSPITAL_BASED_OUTPATIENT_CLINIC_OR_DEPARTMENT_OTHER)
Admission: RE | Admit: 2018-06-04 | Discharge: 2018-06-04 | Disposition: A | Discharge status: Home / Self Care | Payer: Medicare Other | Source: Ambulatory Visit | Attending: Cardiovascular Disease | Admitting: Cardiovascular Disease

## 2018-06-04 ENCOUNTER — Other Ambulatory Visit: Payer: Self-pay | Admitting: Cardiovascular Disease

## 2018-06-04 DIAGNOSIS — R0989 Other specified symptoms and signs involving the circulatory and respiratory systems: Secondary | ICD-10-CM | POA: Insufficient documentation

## 2018-06-05 DIAGNOSIS — M810 Age-related osteoporosis without current pathological fracture: Secondary | ICD-10-CM | POA: Diagnosis not present

## 2018-06-05 DIAGNOSIS — D500 Iron deficiency anemia secondary to blood loss (chronic): Secondary | ICD-10-CM | POA: Diagnosis not present

## 2018-06-05 DIAGNOSIS — Z23 Encounter for immunization: Secondary | ICD-10-CM | POA: Diagnosis not present

## 2018-06-09 ENCOUNTER — Other Ambulatory Visit: Payer: Self-pay

## 2018-06-09 ENCOUNTER — Ambulatory Visit (HOSPITAL_COMMUNITY): Payer: Medicare Other | Attending: Cardiovascular Disease

## 2018-06-09 DIAGNOSIS — I340 Nonrheumatic mitral (valve) insufficiency: Secondary | ICD-10-CM | POA: Diagnosis not present

## 2018-06-09 DIAGNOSIS — I272 Pulmonary hypertension, unspecified: Secondary | ICD-10-CM | POA: Diagnosis not present

## 2018-06-09 DIAGNOSIS — I350 Nonrheumatic aortic (valve) stenosis: Secondary | ICD-10-CM | POA: Insufficient documentation

## 2018-06-19 DIAGNOSIS — M6281 Muscle weakness (generalized): Secondary | ICD-10-CM | POA: Diagnosis not present

## 2018-06-19 DIAGNOSIS — R2681 Unsteadiness on feet: Secondary | ICD-10-CM | POA: Diagnosis not present

## 2018-06-30 DIAGNOSIS — R2681 Unsteadiness on feet: Secondary | ICD-10-CM | POA: Diagnosis not present

## 2018-06-30 DIAGNOSIS — M6281 Muscle weakness (generalized): Secondary | ICD-10-CM | POA: Diagnosis not present

## 2018-07-01 DIAGNOSIS — L570 Actinic keratosis: Secondary | ICD-10-CM | POA: Diagnosis not present

## 2018-07-01 DIAGNOSIS — L814 Other melanin hyperpigmentation: Secondary | ICD-10-CM | POA: Diagnosis not present

## 2018-07-01 DIAGNOSIS — Z85828 Personal history of other malignant neoplasm of skin: Secondary | ICD-10-CM | POA: Diagnosis not present

## 2018-07-01 DIAGNOSIS — L821 Other seborrheic keratosis: Secondary | ICD-10-CM | POA: Diagnosis not present

## 2018-07-07 DIAGNOSIS — M6281 Muscle weakness (generalized): Secondary | ICD-10-CM | POA: Diagnosis not present

## 2018-07-07 DIAGNOSIS — R2681 Unsteadiness on feet: Secondary | ICD-10-CM | POA: Diagnosis not present

## 2018-07-10 DIAGNOSIS — R2681 Unsteadiness on feet: Secondary | ICD-10-CM | POA: Diagnosis not present

## 2018-07-10 DIAGNOSIS — M6281 Muscle weakness (generalized): Secondary | ICD-10-CM | POA: Diagnosis not present

## 2018-07-14 DIAGNOSIS — R2681 Unsteadiness on feet: Secondary | ICD-10-CM | POA: Diagnosis not present

## 2018-07-14 DIAGNOSIS — M6281 Muscle weakness (generalized): Secondary | ICD-10-CM | POA: Diagnosis not present

## 2018-07-21 DIAGNOSIS — R2681 Unsteadiness on feet: Secondary | ICD-10-CM | POA: Diagnosis not present

## 2018-07-21 DIAGNOSIS — M6281 Muscle weakness (generalized): Secondary | ICD-10-CM | POA: Diagnosis not present

## 2018-07-24 DIAGNOSIS — M6281 Muscle weakness (generalized): Secondary | ICD-10-CM | POA: Diagnosis not present

## 2018-07-24 DIAGNOSIS — R2681 Unsteadiness on feet: Secondary | ICD-10-CM | POA: Diagnosis not present

## 2018-07-29 ENCOUNTER — Ambulatory Visit: Payer: Medicare Other | Admitting: Cardiovascular Disease

## 2018-07-30 ENCOUNTER — Encounter: Payer: Self-pay | Admitting: Cardiology

## 2018-07-30 ENCOUNTER — Ambulatory Visit (INDEPENDENT_AMBULATORY_CARE_PROVIDER_SITE_OTHER): Payer: Medicare Other | Admitting: Cardiology

## 2018-07-30 VITALS — BP 130/66 | HR 67 | Ht 62.0 in | Wt 118.4 lb

## 2018-07-30 DIAGNOSIS — I272 Pulmonary hypertension, unspecified: Secondary | ICD-10-CM

## 2018-07-30 DIAGNOSIS — I2510 Atherosclerotic heart disease of native coronary artery without angina pectoris: Secondary | ICD-10-CM | POA: Diagnosis not present

## 2018-07-30 DIAGNOSIS — Z7901 Long term (current) use of anticoagulants: Secondary | ICD-10-CM | POA: Diagnosis not present

## 2018-07-30 DIAGNOSIS — I480 Paroxysmal atrial fibrillation: Secondary | ICD-10-CM | POA: Diagnosis not present

## 2018-07-30 DIAGNOSIS — I10 Essential (primary) hypertension: Secondary | ICD-10-CM | POA: Diagnosis not present

## 2018-07-30 DIAGNOSIS — I38 Endocarditis, valve unspecified: Secondary | ICD-10-CM

## 2018-07-30 DIAGNOSIS — I771 Stricture of artery: Secondary | ICD-10-CM

## 2018-07-30 MED ORDER — AMIODARONE HCL 100 MG PO TABS: 100.0000 mg | ORAL_TABLET | Freq: Every day | ORAL | 3 refills | Status: DC

## 2018-07-30 NOTE — Assessment & Plan Note (Signed)
Anticoagulation stopped April 2019 secondary to GI bleeding

## 2018-07-30 NOTE — Assessment & Plan Note (Signed)
Jan /2016 Cath: mild Ca2+, otw nl cors.

## 2018-07-30 NOTE — Assessment & Plan Note (Signed)
Moderate AS, MR, TR- severe LAE, normal LVF-Oct 2019

## 2018-07-30 NOTE — Patient Instructions (Signed)
Medication Instructions:  Kerin Ransom, PA has recommended making the following medication changes: 1. DECREASE Amiodarone to 100 mg daily  If you need a refill on your cardiac medications before your next appointment, please call your pharmacy.   Follow-Up: Cancel February appointment with Dr Claiborne Billings.  Reschedule for March or April 2020.

## 2018-07-30 NOTE — Assessment & Plan Note (Signed)
PA pressure 56 mmHg- Oct 2019

## 2018-07-30 NOTE — Assessment & Plan Note (Signed)
See dopplers Oct 2019- significant LSCA stenosis but asymptomatic

## 2018-07-30 NOTE — Progress Notes (Signed)
07/30/2018 [name]   Oct 18, 1921  [id number]  Primary Physician Deland Pretty, MD Primary Cardiologist: Dr Claiborne Billings  HPI: Ms. [name] is seen in the office today as a routine follow-up. She is a delightful 84 y/o followed by Dr Claiborne Billings with a history of PAF, valvular heart disease, pulmonary hypertension, and peripheral vascular disease.  There was some confusion and she missed her appointment with Dr. Claiborne Billings yesterday.  She has a history of paroxysmal atrial fibrillation and is on amiodarone.  Anticoagulation was stopped in April 2019 after she had GI bleeding.  She has long-standing pulmonary hypertension.  She had upper extremity arterial and carotid Dopplers done because Dr. Claiborne Billings detected a discrepancy in her blood pressures.  She does not fact have left subclavian artery stenosis by Doppler.  Echocardiogram done 06/09/2018 shows normal LV function with grade 2 diastolic dysfunction.  She has moderate left ear moderate MR moderate TR and moderate pulmonary hypertension.  Her left atrium is severely dilated.  She is here with her family. She has done well since seeing Dr Claiborne Billings. She denies any Lt arm pain, unusual dyspnea, or tachycardia. They're main complaint was with our phone system. The pt says she was on hold so long her phone's battery died when she called to confirm her appointment.  The pt's son called and left a message with administration but has not heard back. I reviewed the echo and doppler reports with them.    Current Outpatient Medications  Medication Sig Dispense Refill  . acetaminophen (TYLENOL) 500 MG tablet Take 500 mg by mouth 4 (four) times daily.     [name] amiodarone (PACERONE) 100 MG tablet Take 1 tablet (100 mg total) by mouth daily. 90 tablet 3  . amLODipine (NORVASC) 2.5 MG tablet Take 1 tablet (2.5 mg total) by mouth daily. 30 tablet 0  . atorvastatin (LIPITOR) 10 MG tablet Take 10 mg by mouth daily.    . Calcium Carb-Cholecalciferol (CALCIUM 600+D) 600-800  MG-UNIT TABS Take 1 tablet by mouth daily.    . cetirizine (ZYRTEC) 10 MG tablet Take 10 mg by mouth daily with breakfast.     . cholecalciferol (VITAMIN D) 1000 UNITS tablet Take 1,000 Units by mouth daily.      . colestipol (COLESTID) 1 G tablet Take 1 g by mouth See admin instructions. Take one tablet (1 g) by mouth three times daily - morning, noon and bedtime (do not take with warfarin)    . cycloSPORINE (RESTASIS) 0.05 % ophthalmic emulsion Place 1 drop into both eyes 2 (two) times daily.     . Emollient (AVEENO ADVANCED CARE EX) Apply 1 application topically 2 (two) times daily as needed (dry skin (on feet and after showering)).     [name] ENSURE PLUS (ENSURE PLUS) LIQD Take 237 mLs by mouth daily. At lunch    . fluticasone (FLONASE) 50 MCG/ACT nasal spray Place 2 sprays into both nostrils 2 (two) times daily.    [name] gabapentin (NEURONTIN) 300 MG capsule TAKE 1 CAPSULE THREE TIMES DAILY AND 3 CAPSULES AT BEDTIME 540 capsule 1  . LACTOBACILLUS RHAMNOSUS, GG, PO Take 1 capsule by mouth daily.    [name] levETIRAcetam (KEPPRA) 250 MG tablet One tablet in the morning and 2 in the evening 270 tablet 3  . lidocaine-prilocaine (EMLA) cream Apply 1 application topically as needed. 30 g 3  . memantine (NAMENDA) 5 MG tablet Take 5 mg by mouth 2 (two) times daily.    . mirtazapine (REMERON) 7.5 MG  tablet Take 3.75 mg by mouth at bedtime.     . Multiple Vitamin (MULTIVITAMIN WITH MINERALS) TABS tablet Take 1 tablet by mouth daily.    . pantoprazole (PROTONIX) 40 MG tablet Take 40 mg by mouth daily.     [name] Peppermint Oil (IBGARD PO) Take 1 tablet by mouth 2 (two) times daily.    . ranitidine (ZANTAC) 150 MG tablet Take 150 mg by mouth 2 (two) times daily.     . simethicone (MYLICON) 962 MG chewable tablet Chew 125 mg by mouth daily as needed for flatulence.     . vitamin B-12 (CYANOCOBALAMIN) 1000 MCG tablet Take 1,000 mcg by mouth daily.     No current facility-administered medications for this visit.      Allergies  Allergen Reactions  . Avelox [Moxifloxacin Hcl In Nacl] Shortness Of Breath and Swelling  . Moxifloxacin Other (See Comments), Shortness Of Breath and Swelling    other  . Aricept [Donepezil Hcl] Diarrhea  . Codeine Swelling       . Donepezil Diarrhea  . Garlic Diarrhea  . Latex Hives, Itching, Rash and Other (See Comments)    Watery blisters   . Onion Diarrhea  . Sulfa Drugs Cross Reactors Swelling  . Duloxetine Rash  . Polysporin [Bacitracin-Polymyxin B] Other (See Comments)    other    Past Medical History:  Diagnosis Date  . Abnormality of gait 09/07/2015  . Anemia, iron deficiency 09/16/2014  . Anxiety   . Arthritis    "hands and feet" (12/06/2017)  . CAD (coronary artery disease)    a. 08/2014 NSTEMI/Cath: mild Ca2+ or RCA ostium, otw nl cors. CO 3.3 L/min (thermo), 3.7 L/min ([place]).  . Carotid arterial disease ([place]) 07/30/2012   carotid doppler; normal study  . CHF (congestive heart failure) ([place])   . Chronic anticoagulation, with coumadin, with PAF and CHADS2Vasc2 score of 4 09/16/2014  . Chronic back pain    "all over" (12/06/2017)  . Complication of anesthesia    "they had hard time waking me up" (12/06/2017)  . Degenerative arthritis   . Diverticulosis   . Essential hypertension   . GERD (gastroesophageal reflux disease)   . History of blood transfusion 12/06/2017  . History of hiatal hernia   . History of nuclear stress test 09/19/2007   normal pattern of perfusion; post-stress EF 86%; EKG negative for ischemia; low risk scan  . Hyperlipidemia   . Left carotid bruit    a. 07/2015 Carotid U/S: 1-39% bilat ICA stenosis.  . Memory disorder 03/05/2014  . Mild renal insufficiency   . Mitral prolapse   . Neuropathy    "hands and feet" (12/06/2017)  . PAF (paroxysmal atrial fibrillation) (HCC)    a. 08/2014-->Coumadin (CHA2DS2VASc = 4).  . Peripheral neuropathy    Small fiber   . Pre-syncope    a. 04/2015 in setting of bradycardia-->CCB/BB doses  adjusted.  . Pulmonary hypertension ([place])   . Seasonal allergies   . Skin cancer    "burned off my face" (12/06/2017)  . Valvular heart disease    a. 04/2015 Echo: EF 65-70%, no rwma, Gr1 DD, mild AS, triv AI, mild MS, mod MR, mod dil LA, mod TR, sev increased PASP.    Social History   Socioeconomic History  . Marital status: Widowed    Spouse name: Not on file  . Number of children: 2  . Years of education: AS  . Highest education level: Not on file  Occupational History  .  Occupation: Retired  Scientific laboratory technician  . Financial resource strain: Not on file  . Food insecurity:    Worry: Not on file    Inability: Not on file  . Transportation needs:    Medical: Not on file    Non-medical: Not on file  Tobacco Use  . Smoking status: Former Smoker    Types: Cigarettes  . Smokeless tobacco: Never Used  . Tobacco comment: "quit smoking ~ 1948  Substance and Sexual Activity  . Alcohol use: No  . Drug use: No  . Sexual activity: Not Currently  Lifestyle  . Physical activity:    Days per week: Not on file    Minutes per session: Not on file  . Stress: Not on file  Relationships  . Social connections:    Talks on phone: Not on file    Gets together: Not on file    Attends religious service: Not on file    Active member of club or organization: Not on file    Attends meetings of clubs or organizations: Not on file    Relationship status: Not on file  . Intimate partner violence:    Fear of current or ex partner: Not on file    Emotionally abused: Not on file    Physically abused: Not on file    Forced sexual activity: Not on file  Other Topics Concern  . Not on file  Social History Narrative   Resides at [facility]   Patient is left handed, but uses right handed.   Patient drinks one cup caffeine daily.     Family History  Problem Relation Age of Onset  . Pneumonia Mother   . Coronary artery disease Mother   . Hypertension Mother   . Heart disease Father   .  Cancer Father   . Hypertension Father   . Arthritis Sister      Review of Systems: General: negative for chills, fever, night sweats or weight changes.  Cardiovascular: negative for chest pain, dyspnea on exertion, edema, orthopnea, palpitations, paroxysmal nocturnal dyspnea or shortness of breath Dermatological: negative for rash Respiratory: negative for cough or wheezing Urologic: negative for hematuria Abdominal: negative for nausea, vomiting, diarrhea, bright red blood per rectum, melena, or hematemesis Neurologic: negative for visual changes, syncope, or dizziness All other systems reviewed and are otherwise negative except as noted above.    Blood pressure 130/66, pulse 67, height 5\' 2"  (1.575 m), weight 118 lb 6.4 oz (53.7 kg), SpO2 99 %.  General appearance: alert, cooperative and no distress Neck: no JVD Lungs: clear to auscultation bilaterally Heart: regular rate and rhythm and 2/.6 systolic murmur AOV, and 6-0/7 MR murmur Extremities: no edema Skin: pale warm and dry Neurologic: Grossly normal    ASSESSMENT AND PLAN:   Subclavian artery stenosis, left ([place]) See dopplers Oct 2019- significant LSCA stenosis but asymptomatic  PAF (paroxysmal atrial fibrillation) (HCC) Holding NSR- will decrease Amiodarone to 100 mg daily as per Dr Evette Georges LOV note  Valvular heart disease Moderate AS, MR, TR- severe LAE, normal LVF-Oct 2019  Pulmonary hypertension (HCC) PA pressure 56 mmHg- Oct 2019  Chronic anticoagulation, with coumadin, with PAF and CHADS2Vasc2 score of 4 Anticoagulation stopped April 2019 secondary to GI bleeding  CAD (coronary artery disease)  Jan /2016 Cath: mild Ca2+, otw nl cors.   PLAN  Discussed test findings with patient and family. Decrease amiodarone to 100 mg daily. B/P in Rt arm only. F/U Dr Claiborne Billings in April.  Kerin Ransom PA-C 07/30/2018 2:40 PM

## 2018-07-30 NOTE — Assessment & Plan Note (Signed)
Holding NSR- will decrease Amiodarone to 100 mg daily as per Dr Evette Georges LOV note

## 2018-08-01 DIAGNOSIS — R2681 Unsteadiness on feet: Secondary | ICD-10-CM | POA: Diagnosis not present

## 2018-08-01 DIAGNOSIS — M6281 Muscle weakness (generalized): Secondary | ICD-10-CM | POA: Diagnosis not present

## 2018-08-05 ENCOUNTER — Other Ambulatory Visit: Payer: Self-pay | Admitting: Neurology

## 2018-08-11 DIAGNOSIS — M6281 Muscle weakness (generalized): Secondary | ICD-10-CM | POA: Diagnosis not present

## 2018-08-11 DIAGNOSIS — R2681 Unsteadiness on feet: Secondary | ICD-10-CM | POA: Diagnosis not present

## 2018-08-21 DIAGNOSIS — E559 Vitamin D deficiency, unspecified: Secondary | ICD-10-CM | POA: Diagnosis not present

## 2018-08-21 DIAGNOSIS — E7800 Pure hypercholesterolemia, unspecified: Secondary | ICD-10-CM | POA: Diagnosis not present

## 2018-08-21 DIAGNOSIS — I10 Essential (primary) hypertension: Secondary | ICD-10-CM | POA: Diagnosis not present

## 2018-08-27 DIAGNOSIS — I340 Nonrheumatic mitral (valve) insufficiency: Secondary | ICD-10-CM | POA: Diagnosis not present

## 2018-08-27 DIAGNOSIS — E7800 Pure hypercholesterolemia, unspecified: Secondary | ICD-10-CM | POA: Diagnosis not present

## 2018-08-27 DIAGNOSIS — I10 Essential (primary) hypertension: Secondary | ICD-10-CM | POA: Diagnosis not present

## 2018-08-27 DIAGNOSIS — R413 Other amnesia: Secondary | ICD-10-CM | POA: Diagnosis not present

## 2018-08-27 DIAGNOSIS — I272 Pulmonary hypertension, unspecified: Secondary | ICD-10-CM | POA: Diagnosis not present

## 2018-08-27 DIAGNOSIS — R197 Diarrhea, unspecified: Secondary | ICD-10-CM | POA: Diagnosis not present

## 2018-08-27 DIAGNOSIS — I4891 Unspecified atrial fibrillation: Secondary | ICD-10-CM | POA: Diagnosis not present

## 2018-08-27 DIAGNOSIS — Z23 Encounter for immunization: Secondary | ICD-10-CM | POA: Diagnosis not present

## 2018-08-27 DIAGNOSIS — K219 Gastro-esophageal reflux disease without esophagitis: Secondary | ICD-10-CM | POA: Diagnosis not present

## 2018-08-27 DIAGNOSIS — G629 Polyneuropathy, unspecified: Secondary | ICD-10-CM | POA: Diagnosis not present

## 2018-08-27 DIAGNOSIS — J45909 Unspecified asthma, uncomplicated: Secondary | ICD-10-CM | POA: Diagnosis not present

## 2018-08-27 DIAGNOSIS — M810 Age-related osteoporosis without current pathological fracture: Secondary | ICD-10-CM | POA: Diagnosis not present

## 2018-09-15 ENCOUNTER — Telehealth: Payer: Self-pay | Admitting: Cardiovascular Disease

## 2018-09-15 DIAGNOSIS — M6281 Muscle weakness (generalized): Secondary | ICD-10-CM | POA: Diagnosis not present

## 2018-09-15 DIAGNOSIS — R2681 Unsteadiness on feet: Secondary | ICD-10-CM | POA: Diagnosis not present

## 2018-09-15 DIAGNOSIS — R2689 Other abnormalities of gait and mobility: Secondary | ICD-10-CM | POA: Diagnosis not present

## 2018-09-15 NOTE — Telephone Encounter (Signed)
Encounter not needed

## 2018-09-18 DIAGNOSIS — R2681 Unsteadiness on feet: Secondary | ICD-10-CM | POA: Diagnosis not present

## 2018-09-18 DIAGNOSIS — R2689 Other abnormalities of gait and mobility: Secondary | ICD-10-CM | POA: Diagnosis not present

## 2018-09-18 DIAGNOSIS — M6281 Muscle weakness (generalized): Secondary | ICD-10-CM | POA: Diagnosis not present

## 2018-09-22 DIAGNOSIS — R2681 Unsteadiness on feet: Secondary | ICD-10-CM | POA: Diagnosis not present

## 2018-09-22 DIAGNOSIS — N3941 Urge incontinence: Secondary | ICD-10-CM | POA: Diagnosis not present

## 2018-09-22 DIAGNOSIS — R2689 Other abnormalities of gait and mobility: Secondary | ICD-10-CM | POA: Diagnosis not present

## 2018-09-22 DIAGNOSIS — M6281 Muscle weakness (generalized): Secondary | ICD-10-CM | POA: Diagnosis not present

## 2018-09-25 DIAGNOSIS — R2681 Unsteadiness on feet: Secondary | ICD-10-CM | POA: Diagnosis not present

## 2018-09-25 DIAGNOSIS — M6281 Muscle weakness (generalized): Secondary | ICD-10-CM | POA: Diagnosis not present

## 2018-09-25 DIAGNOSIS — R2689 Other abnormalities of gait and mobility: Secondary | ICD-10-CM | POA: Diagnosis not present

## 2018-09-25 DIAGNOSIS — N3941 Urge incontinence: Secondary | ICD-10-CM | POA: Diagnosis not present

## 2018-10-01 ENCOUNTER — Ambulatory Visit: Payer: Medicare Other | Admitting: Cardiovascular Disease

## 2018-10-02 DIAGNOSIS — R2681 Unsteadiness on feet: Secondary | ICD-10-CM | POA: Diagnosis not present

## 2018-10-02 DIAGNOSIS — M6281 Muscle weakness (generalized): Secondary | ICD-10-CM | POA: Diagnosis not present

## 2018-10-02 DIAGNOSIS — R2689 Other abnormalities of gait and mobility: Secondary | ICD-10-CM | POA: Diagnosis not present

## 2018-10-02 DIAGNOSIS — N3941 Urge incontinence: Secondary | ICD-10-CM | POA: Diagnosis not present

## 2018-10-07 DIAGNOSIS — R2689 Other abnormalities of gait and mobility: Secondary | ICD-10-CM | POA: Diagnosis not present

## 2018-10-07 DIAGNOSIS — R2681 Unsteadiness on feet: Secondary | ICD-10-CM | POA: Diagnosis not present

## 2018-10-07 DIAGNOSIS — M6281 Muscle weakness (generalized): Secondary | ICD-10-CM | POA: Diagnosis not present

## 2018-10-07 DIAGNOSIS — N3941 Urge incontinence: Secondary | ICD-10-CM | POA: Diagnosis not present

## 2018-10-13 DIAGNOSIS — R2689 Other abnormalities of gait and mobility: Secondary | ICD-10-CM | POA: Diagnosis not present

## 2018-10-13 DIAGNOSIS — R2681 Unsteadiness on feet: Secondary | ICD-10-CM | POA: Diagnosis not present

## 2018-10-13 DIAGNOSIS — M6281 Muscle weakness (generalized): Secondary | ICD-10-CM | POA: Diagnosis not present

## 2018-10-13 DIAGNOSIS — N3941 Urge incontinence: Secondary | ICD-10-CM | POA: Diagnosis not present

## 2018-10-16 DIAGNOSIS — R2689 Other abnormalities of gait and mobility: Secondary | ICD-10-CM | POA: Diagnosis not present

## 2018-10-16 DIAGNOSIS — M6281 Muscle weakness (generalized): Secondary | ICD-10-CM | POA: Diagnosis not present

## 2018-10-16 DIAGNOSIS — R2681 Unsteadiness on feet: Secondary | ICD-10-CM | POA: Diagnosis not present

## 2018-10-16 DIAGNOSIS — R197 Diarrhea, unspecified: Secondary | ICD-10-CM | POA: Diagnosis not present

## 2018-10-16 DIAGNOSIS — N3941 Urge incontinence: Secondary | ICD-10-CM | POA: Diagnosis not present

## 2018-10-20 DIAGNOSIS — M6281 Muscle weakness (generalized): Secondary | ICD-10-CM | POA: Diagnosis not present

## 2018-10-20 DIAGNOSIS — R2689 Other abnormalities of gait and mobility: Secondary | ICD-10-CM | POA: Diagnosis not present

## 2018-10-20 DIAGNOSIS — R2681 Unsteadiness on feet: Secondary | ICD-10-CM | POA: Diagnosis not present

## 2018-10-23 DIAGNOSIS — M6281 Muscle weakness (generalized): Secondary | ICD-10-CM | POA: Diagnosis not present

## 2018-10-23 DIAGNOSIS — R2681 Unsteadiness on feet: Secondary | ICD-10-CM | POA: Diagnosis not present

## 2018-10-23 DIAGNOSIS — R2689 Other abnormalities of gait and mobility: Secondary | ICD-10-CM | POA: Diagnosis not present

## 2018-10-27 DIAGNOSIS — R2689 Other abnormalities of gait and mobility: Secondary | ICD-10-CM | POA: Diagnosis not present

## 2018-10-27 DIAGNOSIS — M6281 Muscle weakness (generalized): Secondary | ICD-10-CM | POA: Diagnosis not present

## 2018-10-27 DIAGNOSIS — R2681 Unsteadiness on feet: Secondary | ICD-10-CM | POA: Diagnosis not present

## 2018-10-28 DIAGNOSIS — H401131 Primary open-angle glaucoma, bilateral, mild stage: Secondary | ICD-10-CM | POA: Diagnosis not present

## 2018-10-28 DIAGNOSIS — H16223 Keratoconjunctivitis sicca, not specified as Sjogren's, bilateral: Secondary | ICD-10-CM | POA: Diagnosis not present

## 2018-10-28 DIAGNOSIS — H2513 Age-related nuclear cataract, bilateral: Secondary | ICD-10-CM | POA: Diagnosis not present

## 2018-10-28 DIAGNOSIS — H35412 Lattice degeneration of retina, left eye: Secondary | ICD-10-CM | POA: Diagnosis not present

## 2018-10-29 ENCOUNTER — Telehealth: Payer: Self-pay | Admitting: *Deleted

## 2018-10-29 NOTE — Telephone Encounter (Signed)
PA for Keppra 250mg  #90/30 or #270/90 completed via CoverMyMeds, Key# [id number].  Dx: Polyneuropathy (G63). Tried and failed meds: Gabapentin, Lidocaine cream, tylenol, Xanax, Benadryl, Hydralazine, Hydrocodone, Tramadol. She is allergic to Cymbalta, Codeine/fim

## 2018-10-30 DIAGNOSIS — R2689 Other abnormalities of gait and mobility: Secondary | ICD-10-CM | POA: Diagnosis not present

## 2018-10-30 DIAGNOSIS — R2681 Unsteadiness on feet: Secondary | ICD-10-CM | POA: Diagnosis not present

## 2018-10-30 DIAGNOSIS — M6281 Muscle weakness (generalized): Secondary | ICD-10-CM | POA: Diagnosis not present

## 2018-11-03 NOTE — Telephone Encounter (Signed)
We will see if the Keppra generic may be available on [employer].  If so, the patient can get the medication through this route.

## 2018-11-03 NOTE — Telephone Encounter (Signed)
I checked goodrx.com and #270 for 250mg  tabs it would be about 30-31 dollars (90 days supply)

## 2018-11-03 NOTE — Telephone Encounter (Signed)
PA for Keppra 250 mg was denied. The denial from [employer] was made due to the off label use of Keppra for the dx of Polyneuropathy.  Will fwd to MD to see if an appeal needs to be made or if medication should be changed.

## 2018-11-03 NOTE — Telephone Encounter (Signed)
I called pt to discuss. She states she called [employer] after receiving denial letter as well and told them she was doing well on generic keppra. She was told they shipped her another refill. I recommended she call [employer] to double check. Advised her to call back if anything further needed.

## 2018-11-04 NOTE — Telephone Encounter (Signed)
I contacted pt and she is going to purchase med without insurance. Pt to call back if she has any issues.

## 2018-11-04 NOTE — Telephone Encounter (Signed)
Pt said she rec'd a letter from [facility] states medication needs PA. Please call to advise

## 2018-11-06 DIAGNOSIS — R2681 Unsteadiness on feet: Secondary | ICD-10-CM | POA: Diagnosis not present

## 2018-11-06 DIAGNOSIS — M6281 Muscle weakness (generalized): Secondary | ICD-10-CM | POA: Diagnosis not present

## 2018-11-06 DIAGNOSIS — R2689 Other abnormalities of gait and mobility: Secondary | ICD-10-CM | POA: Diagnosis not present

## 2018-11-10 ENCOUNTER — Ambulatory Visit: Payer: Medicare Other | Admitting: Neurology

## 2018-11-11 ENCOUNTER — Ambulatory Visit: Payer: Medicare Other | Admitting: Cardiovascular Disease

## 2018-11-13 MED ORDER — LEVETIRACETAM 250 MG PO TABS: ORAL_TABLET | ORAL | 3 refills | Status: DC

## 2018-11-13 NOTE — Addendum Note (Signed)
Addended by: Verlin Grills T on: 11/13/2018 11:58 AM   Modules accepted: Orders

## 2018-11-13 NOTE — Telephone Encounter (Signed)
Pt is asking for a call from RN to discuss her being told by [facility] [phone]  That a prescription is needed in order for her to get a 90 day prescription for levETIRAcetam (KEPPRA) 250 MG tablet please call

## 2018-11-13 NOTE — Telephone Encounter (Signed)
I contacted the pt back advised we would send a 90 day supply of Keprra to [facility] on [address].

## 2018-11-25 ENCOUNTER — Other Ambulatory Visit: Payer: Self-pay | Admitting: Cardiovascular Disease

## 2018-11-25 ENCOUNTER — Telehealth: Payer: Self-pay | Admitting: Cardiovascular Disease

## 2018-11-25 NOTE — Telephone Encounter (Signed)
I called and spoke with son, he states that [facility] is sending them Diltiazem, I advised with patient back through the last office visits that this medication was not on her list, and was not noted for her to start it.   Son states that his mother called this morning- I advised I did not see documentation of that call, or any change in medications. I did advise patient that her PCP is not in our system and maybe she spoke with them. I advised him if he called there office and they suggested a medication he could call me back and we could discuss with Dr.Kelly if need be.  Son verbalized understanding, no other questions at this time.

## 2018-11-25 NOTE — Telephone Encounter (Signed)
New Message    Pt c/o medication issue:  1. Name of Medication: Diltiazem   2. How are you currently taking this medication (dosage and times per day)? Not taking but the pharmacy [employer] is wanting to send her a refill for it   3. Are you having a reaction (difficulty breathing--STAT)? No   4. What is your medication issue? Pts son is calling concerning the pts medication. He doesn't know why [employer] is trying to fill this prescription but he is looking for some clarification

## 2018-11-25 NOTE — Telephone Encounter (Signed)
Carvedilol and diltiazem refilled.

## 2018-12-11 NOTE — Telephone Encounter (Signed)
Any advice for this Cardizem?

## 2018-12-11 NOTE — Addendum Note (Signed)
Addended by: Caprice Beaver T on: 12/11/2018 02:48 PM   Modules accepted: Orders

## 2018-12-11 NOTE — Telephone Encounter (Signed)
Pt son called 2 weeks ago because he noticed on My Chart that the pt did not have Diltiazem 60mg  bid noted as she has been taking it... back in 01/2018.. Dr. Claiborne Billings noted that when she was D/C from the [facility] 11/2017 her Cardizem was d/c'd but a low dose was restarted... however... she was taking 60mg  when it was stopped so this is not a lower dose..   Pt has been seen since but no notes about the diltiazem 60mg  bid... her BP was 130/76 when she saw Kerin Ransom PA 07/2018... she does not know what it has been since but feels well... she is still worried that she had mistaken and has been taking it when she should not have been..  I advised her to continue for now and if her son can get her BP checked to let us know.. will forward to Dr. Claiborne Billings for his review and advice... she has follow up with him 02/05/19..  Will call pt back.

## 2018-12-11 NOTE — Telephone Encounter (Signed)
Please update medication list as needed and continue all medication patient was taking since last OV.  All assessment since April/2019 showed stable BP and EKG; therefore, no need to change anything right now.  Patient to bring bottles of medication she takes to next OV to complete appropriate medRec.

## 2018-12-11 NOTE — Telephone Encounter (Signed)
Called patient, advised to continue medications from last OV, patient stated she would, and then speak with Dr.Kelly at her visit in June.   Patient did mention her BP was going good 128/72 when she checked it.

## 2018-12-11 NOTE — Telephone Encounter (Signed)
° ° °  Patient calling to discuss diltiazem (CARDIZEM SR) 60 MG 12 hr capsule.

## 2018-12-30 ENCOUNTER — Encounter: Payer: Self-pay | Admitting: Podiatry

## 2018-12-30 ENCOUNTER — Ambulatory Visit (INDEPENDENT_AMBULATORY_CARE_PROVIDER_SITE_OTHER): Payer: Medicare Other | Admitting: Podiatry

## 2018-12-30 ENCOUNTER — Other Ambulatory Visit: Payer: Self-pay

## 2018-12-30 ENCOUNTER — Ambulatory Visit (INDEPENDENT_AMBULATORY_CARE_PROVIDER_SITE_OTHER): Payer: Medicare Other

## 2018-12-30 VITALS — Temp 97.9°F

## 2018-12-30 DIAGNOSIS — L97519 Non-pressure chronic ulcer of other part of right foot with unspecified severity: Secondary | ICD-10-CM

## 2018-12-30 DIAGNOSIS — M79671 Pain in right foot: Secondary | ICD-10-CM

## 2018-12-30 DIAGNOSIS — L97501 Non-pressure chronic ulcer of other part of unspecified foot limited to breakdown of skin: Secondary | ICD-10-CM

## 2018-12-30 DIAGNOSIS — Z20828 Contact with and (suspected) exposure to other viral communicable diseases: Secondary | ICD-10-CM | POA: Diagnosis not present

## 2018-12-30 DIAGNOSIS — D689 Coagulation defect, unspecified: Secondary | ICD-10-CM

## 2018-12-30 MED ORDER — DOXYCYCLINE HYCLATE 100 MG PO TABS: 100.0000 mg | ORAL_TABLET | Freq: Two times a day (BID) | ORAL | 0 refills | Status: DC

## 2018-12-30 NOTE — Patient Instructions (Signed)
Keep a small amount of betaine on the wound daily. Start the antibiotic that I have sent to your pharmacy. If you notice any increase in redness, drainage or signs of infection please let me know.   It was nice to meet you today. Stay well!

## 2018-12-31 ENCOUNTER — Other Ambulatory Visit: Payer: Self-pay | Admitting: Podiatry

## 2018-12-31 DIAGNOSIS — L97519 Non-pressure chronic ulcer of other part of right foot with unspecified severity: Secondary | ICD-10-CM

## 2019-01-01 ENCOUNTER — Telehealth: Payer: Self-pay | Admitting: *Deleted

## 2019-01-01 NOTE — Telephone Encounter (Signed)
[place] states pt was seen by Dr. Jacqualyn Posey on 12/29/2018 and she would like to discuss pt's dressing.

## 2019-01-01 NOTE — Telephone Encounter (Signed)
[place] states pt is having a difficult time dressing the bottom of her foot as instructed by Dr. Jacqualyn Posey, but if we would send orders she would be able to help pt.

## 2019-01-01 NOTE — Telephone Encounter (Signed)
Left message [facility] I was returning the call concerning pt.

## 2019-01-01 NOTE — Telephone Encounter (Signed)
She has a callus that got thick and formed a blister. I would paint the wound with betadine then apply a DSD over the area. I would try to do this daily but if not then every other day.

## 2019-01-02 NOTE — Telephone Encounter (Signed)
Faxed copy of Dr. Leigh Aurora 01/01/2019 5:31pm orders.

## 2019-01-05 NOTE — Progress Notes (Signed)
Subjective: 85 year old female presents the office today with her son for concerns of a painful area on the bottom of her right foot.  She states that she normally gets a callus in this area that she does trim down but she is unfortunately not been able to do so recently given the recent pandemic.  She states the areas become more swollen she is had some clear drainage coming from the area.  Denies any pus.  Denies any increase in swelling or redness to the foot. Denies any systemic complaints such as fevers, chills, nausea, vomiting. No acute changes since last appointment, and no other complaints at this time.   Objective: AAO x3, NAD DP/PT pulses palpable bilaterally, CRT less than 3 seconds Thick hyperkeratotic lesion right foot submetatarsal 1.  There is dried blood in the callus.  Upon debridement of this today.  The blister has formed underneath it.  There is no drainage or pus identified today.  There is no fluctuation or pressure or any malodor.  No clinical signs of infection are noted today otherwise. Bunion deformity is present.  No open lesions or pre-ulcerative lesions.  No pain with calf compression, swelling, warmth, erythema  Assessment: Wound right foot  Plan: -All treatment options discussed with the patient including all alternatives, risks, complications.  -Patient debrided the hyperkeratotic lesion to reveal underlying wound, blister.  I did not remove all of the skin today.  I did this with a #312 blade scalpel.  I debrided the hyperkeratotic tissue.  Given the small amount of drainage he is having ongoing use Betadine daily on the area. -Doxycycline -Offloading pads.  -Patient encouraged to call the office with any questions, concerns, change in symptoms.   Return in about 1 week (around 01/06/2019).  Trula Slade DPM

## 2019-01-06 ENCOUNTER — Ambulatory Visit (INDEPENDENT_AMBULATORY_CARE_PROVIDER_SITE_OTHER): Payer: Medicare Other | Admitting: Podiatry

## 2019-01-06 ENCOUNTER — Other Ambulatory Visit: Payer: Self-pay

## 2019-01-06 ENCOUNTER — Encounter: Payer: Self-pay | Admitting: Podiatry

## 2019-01-06 VITALS — Temp 97.2°F

## 2019-01-06 DIAGNOSIS — L97501 Non-pressure chronic ulcer of other part of unspecified foot limited to breakdown of skin: Secondary | ICD-10-CM

## 2019-01-06 DIAGNOSIS — L97522 Non-pressure chronic ulcer of other part of left foot with fat layer exposed: Secondary | ICD-10-CM

## 2019-01-15 NOTE — Progress Notes (Signed)
Subjective: 85 year old female presents to the office today for evaluation of a wound on the right foot.  She is doing much better.  She is been keeping small meta Betadine on the area and she has finished the doxycycline.  She said the pain is much improved and she had a nurse help her change the bandage.  She denies any increase in swelling or redness.  No drainage or pus.  She has no other concerns today.  She denies any fevers, chills, nausea, vomiting.  No calf pain, chest pain, shortness of breath.  Objective: AAO x3, NAD DP/PT pulses palpable bilaterally, CRT less than 3 seconds Thick hyperkeratotic lesion right foot submetatarsal 1.  Upon debridement of this today there is no measuring 0.5 x 0.3 cm and is superficial with a granular wound base.  There is no surrounding erythema, ascending cellulitis there is no fluctuation of the tissue or any malodor. No open lesions or pre-ulcerative lesions.  No pain with calf compression, swelling, warmth, erythema  Assessment: Wound right foot, improving  Plan: -All treatment options discussed with the patient including all alternatives, risks, complications.  -There should be debrided the wound and to remove all nonviable hyperkeratotic tissue with a #312 with scalpel that any complications or bleeding.  Since she is doing well and continuing the same dressing protocol meta Betadine to the area daily followed by dry sterile dressing.  Offloading at all times. -Monitor for any clinical signs or symptoms of infection and directed to call the office immediately should any occur or go to the ER.  Trula Slade DPM

## 2019-01-16 ENCOUNTER — Ambulatory Visit (INDEPENDENT_AMBULATORY_CARE_PROVIDER_SITE_OTHER): Payer: Medicare Other | Admitting: Podiatry

## 2019-01-16 ENCOUNTER — Encounter: Payer: Self-pay | Admitting: Podiatry

## 2019-01-16 ENCOUNTER — Other Ambulatory Visit: Payer: Self-pay

## 2019-01-16 VITALS — Temp 97.7°F

## 2019-01-16 DIAGNOSIS — D689 Coagulation defect, unspecified: Secondary | ICD-10-CM

## 2019-01-16 DIAGNOSIS — L97501 Non-pressure chronic ulcer of other part of unspecified foot limited to breakdown of skin: Secondary | ICD-10-CM

## 2019-01-17 NOTE — Progress Notes (Signed)
Subjective: 85 year old female presents to the office today for evaluation of a wound on the right foot.  She states that the area on the right foot is doing "so much better".  She denies any drainage or pus coming from the area she denies any redness.  She is been keeping an offloading pad on the area as well as small amount of Betadine.  She has no other concerns today.  She denies any fevers, chills, nausea, vomiting.  No calf pain, chest pain, shortness of breath.  Objective: AAO x3, NAD DP/PT pulses palpable bilaterally, CRT less than 3 seconds There is mild hyperkeratotic lesion right foot submetatarsal 1.  Upon debridement of the hyperkeratotic lesion there was no underlying ulceration, drainage or any clinical signs of infection and the wound appears to be healed. Erythema, ascending cellulitis.  No fluctuation or crepitation or any malodor.   No open lesions or pre-ulcerative lesions.  No pain with calf compression, swelling, warmth, erythema  Assessment: Wound right foot, healed   Plan: -All treatment options discussed with the patient including all alternatives, risks, complications.  -I debrided the hyperkeratotic lesions today without any complications or bleeding to reveal the underlying wound is healed.  Recommended moisturizer daily and continue offloading at all times.  She normally has only come to the facility where she lives to trim her nails and calluses but not been able to do so because of skin thinning.  I will have her follow-up in 1 month for this if needed. -Daily foot inspection  Return in about 4 weeks (around 02/13/2019).  Trula Slade DPM

## 2019-01-19 ENCOUNTER — Telehealth: Payer: Self-pay

## 2019-01-19 MED ORDER — LEVETIRACETAM 250 MG PO TABS: ORAL_TABLET | ORAL | 3 refills | Status: DC

## 2019-01-19 NOTE — Telephone Encounter (Signed)
Received a fax from [facility] in [place] stating the pt's insurance will pay for a max of 2.5 tabs daily not 3 tabs of Keprra 250 mg has previously rx'd.  Ok to change to 2.5 tabs daily?

## 2019-01-19 NOTE — Addendum Note (Signed)
Addended by: Kathrynn Ducking on: 01/19/2019 04:42 PM   Modules accepted: Orders

## 2019-01-19 NOTE — Telephone Encounter (Signed)
Okay to switch the dosing of the Keppra.  Will take half a tablet in the morning and a full tablet in the evening.

## 2019-01-21 ENCOUNTER — Telehealth: Payer: Self-pay | Admitting: Neurology

## 2019-01-21 DIAGNOSIS — R2689 Other abnormalities of gait and mobility: Secondary | ICD-10-CM | POA: Diagnosis not present

## 2019-01-21 DIAGNOSIS — M545 Low back pain: Secondary | ICD-10-CM | POA: Diagnosis not present

## 2019-01-21 DIAGNOSIS — M6281 Muscle weakness (generalized): Secondary | ICD-10-CM | POA: Diagnosis not present

## 2019-01-21 DIAGNOSIS — R2681 Unsteadiness on feet: Secondary | ICD-10-CM | POA: Diagnosis not present

## 2019-01-21 NOTE — Telephone Encounter (Addendum)
I contacted the pt's son Rush Landmark (ok per dpr) and he and I were able to discuss this message. He reports over the last week pt has developed shaking (tremors) and unsteadiness in the gait.  Son reports shakes are all over her body. Pt was last evaluated in August of 2019 and the shaking is a new issue for her. I advised we could set up an appt for 02/16/19 at 11 am for an evaluation with Dr. Jannifer Franklin. Son was agreeable and will talk with the pt to confirm date and time works for her.

## 2019-01-21 NOTE — Telephone Encounter (Signed)
I contacted the pt's son and advised we had an earlier appointment become available and if the pt is interested we could see her on 01/26/19 at 2:30 check in time of 2 pm.

## 2019-01-21 NOTE — Telephone Encounter (Signed)
Pt's son states that the pt has been experiencing shakes all over her body for a week and her son is very concerned. He does not know if it has to do with what she has been getting treated for. Please advise.

## 2019-01-22 NOTE — Telephone Encounter (Signed)
I contacted the pt's son [name] again and he was agreeable to moving [name] appt to 01/26/19 at 2:30 check in time of 2. I advised of the check in process.

## 2019-01-26 ENCOUNTER — Encounter: Payer: Self-pay | Admitting: Neurology

## 2019-01-26 ENCOUNTER — Ambulatory Visit (INDEPENDENT_AMBULATORY_CARE_PROVIDER_SITE_OTHER): Payer: Medicare Other | Admitting: Neurology

## 2019-01-26 ENCOUNTER — Other Ambulatory Visit: Payer: Self-pay

## 2019-01-26 VITALS — BP 116/59 | HR 60 | Temp 96.9°F | Ht 62.0 in | Wt 117.0 lb

## 2019-01-26 DIAGNOSIS — Z5181 Encounter for therapeutic drug level monitoring: Secondary | ICD-10-CM

## 2019-01-26 DIAGNOSIS — R251 Tremor, unspecified: Secondary | ICD-10-CM | POA: Diagnosis not present

## 2019-01-26 DIAGNOSIS — R5382 Chronic fatigue, unspecified: Secondary | ICD-10-CM

## 2019-01-26 DIAGNOSIS — G63 Polyneuropathy in diseases classified elsewhere: Secondary | ICD-10-CM
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
## 2019-01-27 ENCOUNTER — Telehealth: Payer: Self-pay

## 2019-01-27 LAB — COMPREHENSIVE METABOLIC PANEL
ALT: 15 IU/L (ref 0–32)
AST: 24 IU/L (ref 0–40)
Albumin/Globulin Ratio: 1.8 (ref 1.2–2.2)
Albumin: 4.5 g/dL (ref 3.5–4.6)
Alkaline Phosphatase: 65 IU/L (ref 39–117)
BUN/Creatinine Ratio: 22 (ref 12–28)
BUN: 20 mg/dL (ref 10–36)
Bilirubin Total: 0.3 mg/dL (ref 0.0–1.2)
CO2: 25 mmol/L (ref 20–29)
Calcium: 9.7 mg/dL (ref 8.7–10.3)
Chloride: 100 mmol/L (ref 96–106)
Creatinine, Ser: 0.89 mg/dL (ref 0.57–1.00)
GFR calc Af Amer: 64 mL/min/{1.73_m2} (ref >59)
GFR calc non Af Amer: 56 mL/min/{1.73_m2} — ABNORMAL LOW (ref >59)
Globulin, Total: 2.5 g/dL (ref 1.5–4.5)
Glucose: 95 mg/dL (ref 65–99)
Potassium: 4.4 mmol/L (ref 3.5–5.2)
Sodium: 140 mmol/L (ref 134–144)
Total Protein: 7 g/dL (ref 6.0–8.5)

## 2019-01-27 LAB — TSH: TSH: 3.18 u[IU]/mL (ref 0.450–4.500)

## 2019-01-27 NOTE — Telephone Encounter (Signed)
-----   Message from Kathrynn Ducking, MD sent at 01/27/2019  7:16 AM EDT -----  The blood work results are unremarkable. Please call the patient. ----- Message ----- From: Lavone Neri Lab Results In Sent: 01/27/2019   5:37 AM EDT To: Kathrynn Ducking, MD

## 2019-01-27 NOTE — Telephone Encounter (Signed)
I reached out to the patient and was able to advise on lab results. Pt verbalized understanding.

## 2019-01-28 DIAGNOSIS — M6281 Muscle weakness (generalized): Secondary | ICD-10-CM | POA: Diagnosis not present

## 2019-01-28 DIAGNOSIS — R2681 Unsteadiness on feet: Secondary | ICD-10-CM | POA: Diagnosis not present

## 2019-01-28 DIAGNOSIS — M545 Low back pain: Secondary | ICD-10-CM | POA: Diagnosis not present

## 2019-01-28 DIAGNOSIS — R2689 Other abnormalities of gait and mobility: Secondary | ICD-10-CM | POA: Diagnosis not present

## 2019-01-29 ENCOUNTER — Telehealth: Payer: Self-pay | Admitting: Cardiovascular Disease

## 2019-01-29 NOTE — Telephone Encounter (Signed)
Patient has a cell phone that son states she does know how to answer- for telephone visits, but he will check with [facility] to see if someone could assist her if needed.   [phone] patients cell phone  Also son would like to know of any changes that are made.   Discussion on my chart issues- discussed [employer].

## 2019-01-29 NOTE — Telephone Encounter (Signed)
New Message    Patient would like someone to call him about virtual visit due to his mother not being tech savvy.  He would like to setup a date and time for someone to call his mother when someone will be there to assist her.  Please call him as soon as possible.

## 2019-02-02 DIAGNOSIS — M6281 Muscle weakness (generalized): Secondary | ICD-10-CM | POA: Diagnosis not present

## 2019-02-02 DIAGNOSIS — R2681 Unsteadiness on feet: Secondary | ICD-10-CM | POA: Diagnosis not present

## 2019-02-02 DIAGNOSIS — M545 Low back pain: Secondary | ICD-10-CM | POA: Diagnosis not present

## 2019-02-02 DIAGNOSIS — R2689 Other abnormalities of gait and mobility: Secondary | ICD-10-CM | POA: Diagnosis not present

## 2019-02-04 DIAGNOSIS — M545 Low back pain: Secondary | ICD-10-CM | POA: Diagnosis not present

## 2019-02-04 DIAGNOSIS — M6281 Muscle weakness (generalized): Secondary | ICD-10-CM | POA: Diagnosis not present

## 2019-02-04 DIAGNOSIS — R2681 Unsteadiness on feet: Secondary | ICD-10-CM | POA: Diagnosis not present

## 2019-02-04 DIAGNOSIS — R2689 Other abnormalities of gait and mobility: Secondary | ICD-10-CM | POA: Diagnosis not present

## 2019-02-05 ENCOUNTER — Telehealth (INDEPENDENT_AMBULATORY_CARE_PROVIDER_SITE_OTHER): Payer: Medicare Other | Admitting: Cardiovascular Disease

## 2019-02-05 VITALS — BP 124/73 | HR 73 | Ht 62.5 in | Wt 118.0 lb

## 2019-02-05 DIAGNOSIS — F0280 Dementia in other diseases classified elsewhere without behavioral disturbance: Secondary | ICD-10-CM

## 2019-02-05 DIAGNOSIS — G301 Alzheimer's disease with late onset: Secondary | ICD-10-CM

## 2019-02-05 DIAGNOSIS — I771 Stricture of artery: Secondary | ICD-10-CM | POA: Diagnosis not present

## 2019-02-05 DIAGNOSIS — I10 Essential (primary) hypertension: Secondary | ICD-10-CM

## 2019-02-05 DIAGNOSIS — I38 Endocarditis, valve unspecified: Secondary | ICD-10-CM

## 2019-02-05 DIAGNOSIS — I350 Nonrheumatic aortic (valve) stenosis: Secondary | ICD-10-CM

## 2019-02-05 DIAGNOSIS — I272 Pulmonary hypertension, unspecified: Secondary | ICD-10-CM

## 2019-02-05 DIAGNOSIS — I340 Nonrheumatic mitral (valve) insufficiency: Secondary | ICD-10-CM | POA: Diagnosis not present

## 2019-02-05 DIAGNOSIS — I2510 Atherosclerotic heart disease of native coronary artery without angina pectoris: Secondary | ICD-10-CM | POA: Diagnosis not present

## 2019-02-05 DIAGNOSIS — I480 Paroxysmal atrial fibrillation: Secondary | ICD-10-CM | POA: Diagnosis not present

## 2019-02-05 NOTE — Patient Instructions (Signed)

## 2019-02-05 NOTE — Progress Notes (Signed)
Virtual Visit via Telephone Note   This visit type was conducted due to national recommendations for restrictions regarding the COVID-19 Pandemic (e.g. social distancing) in an effort to limit this patient's exposure and mitigate transmission in our community.  Due to her co-morbid illnesses, this patient is at least at moderate risk for complications without adequate follow up.  This format is felt to be most appropriate for this patient at this time.  The patient did not have access to video technology/had technical difficulties with video requiring transitioning to audio format only (telephone).  All issues noted in this document were discussed and addressed.  No physical exam could be performed with this format.  Please refer to the patient's chart for her  consent to telehealth for [facility]   Date:  02/05/2019   ID:  Narda Amber, DOB [birth year], MRN [id number]  Patient Location: Other:  [place] Provider Location: Home  PCP:  Deland Pretty, MD  Cardiologist:  Shelva Majestic, MD  Electrophysiologist:  None   Evaluation Performed:  Follow-Up Visit  Chief Complaint: 73-month follow-up cardiology evaluation  History of Present Illness:    [name] is a 85 y.o. female who has a history of hypertension, hyperlipidemia, as well as valvular heart disease. An echo Doppler study in December 2013 showed normal systolic function with an ejection fraction of 55-60%. She had mild stenosis of her aortic valve, moderately severe calcified anulus of the mitral valve with moderate MR, moderately severe LA dilatation,  mild-to-moderate RA dilatation, moderate tricuspid regurgitation and moderately severe pulmonary hypertension with PA pressure estimated at 66 mm. She was started on amlodipine at 2.5 mg.  She felt that she was breathing better since initiating this therapy and  her blood pressure had markedly improved. A followup echo Doppler study on 11/16/2013 showed normal  systolic function with an ejection fraction at 60-65%.  Diastolic parameters were normal.  She had mitral annular calcification with moderate mitral regurgitation.  The left atrium was moderately dilated, the right atrium was moderately dilated, and her tricuspid stenosis was now considered severe.  Pulmonary pressures were slightly reduced from previously, but were still elevated at 61 mm.    She was admitted to the hospital on 09/13/2014 and discharged  3 days later.  On the day of admission she awakened from sleep with midsternal all chest discomfort which he initially felt was indigestion.  Her troponin was mildly elevated at 0.23, which increased to 1.15 and there were new inferior T-wave abnormalities.  She was felt to have suffered a non-ST segment elevation MI.  She also developed paroxysmal atrial fibrillation with RVR, which resulted in similar symptomatology as her admission and was felt most likely that this may have been the cause for her admission.  I performed cardiac catheterization on 09/14/14  Fluoroscopy revealed severe mitral annular calcification and very mild calcification in the region of the aortic valve.  She had hyperdynamic LV function with an ejection fraction of a proximally 70%.  There was severe mitral annular calcification with 3+ MR.  There was mild calcification in the region of the RCA ostium, but otherwise normal-appearing coronary arteries.  Right heart catheterization was performed and her wedge pressure mean was 10.  LV pressure was 152/11.  Pulmonary artery pressure was 33/12.  Cardiac output was 3.3 L/m by the thermodilution method and 3.7 L/m by the Fick method.  When I saw her in April 2016 she was doing well and maintaining sinus rhythm with  ventricular rate at 58 bpm. She was recently hospitalized on 05/08/2015 through 05/12/2015 with this and presyncope.  She was found to be bradycardic and had a junctional rhythm.  Her Cardizem, beta blocker therapy was  discontinued.  She  Was dehydrated andalso had mild elevation of potassium with renal insufficiency and ACE inhibition was discontinued. She also had a urinary tract infection for which she was treated with Keflex. She was started back on iron therapy for iron deficiency anemia. At discharge, Cardizem low dose was restarted.  After going home from the hospital, on 05/17/2015 her heart rate increased to greater than 100 bpm. Her blood pressure now has been increased on her, reduced medications.  She is on Coumadin therapy  With a chads2vasc score of 4.  When I saw her, her blood pressure was elevated and with her episodes of increased heart rate.  I started her on carvedilol one half of a 3.125 mg twice a day.  She has tolerated this well and denies any recurrent episodes of fast heartbeat or any episodes of dizziness.  She admits to trace edema above her pressure socks.  An echo Doppler study on 05/09/2015 showed an ejection fraction of 65-70%.  There was grade 1 diastolic dysfunction.  There was mild aortic stenosis with trivial AR, severe mitral annular calcification with moderate MR, moderate left atrial dilatation, moderate TR and increased pulmonary pressures at 74 mm Hg. in December 2016, a carotid duplex study demonstrated heterogeneous plaque bilaterally.  There was 1-39% bilateral ICA stenoses.  She had normal subclavian arteries bilaterally.  She had patent vertebral arteries with antegrade flow  In October 2017 she had a fall and  tripped over a step.  She did not go to the emergency room, but saw her primary physician and a CT scan of her head revealed mild injury to her right occipital scalp but no underlying skull fracture or intracranial hemorrhage.  There was an air-fluid level of the maxillary sinus.   When I saw her in April 2018.  She was not having any chest pain, although admitted to some mild shortness of breath.  She was walking with a cane.  specifically denies any chest pain.    She  goes to a salt water pool twice per week.  She denies any episodes of dizziness.  She denies any further falls.  She is no longer driving.    I last saw her in October 2018 at which time she was getting more visibly weak. She denied any recurrent chest pain.  She really was experiencing some shortness of breath with walking.  She was unaware of recurrent arrhythmia.  She denies syncope.  A follow-up echo Doppler study in May 2018 showed an EF of 60-65% with grade 2 diastolic dysfunction.  There was mild aortic stenosis with trivial AR, severe left atrial dilation with severe mitral regurgitation and she had significant pulmonary hypertension with a PA peak pressure estimate at 64 mm.  her last office visit, I was concerned with potential fall risk.  Now that she has become more frail and ordered an event monitor to make certain she was not having recurrent PAF.  The monitor was done from 06/26/2017 through 07/25/2017.  She was maintaining sinus rhythm and had some mild bradycardia, sinus arrhythmia with PACs, and some episodes of sinus tachycardia.  There were no episodes of recurrent AF.    She was evaluated by Loma Sousa, PAC on 08/10/2017 and later by Jory Sims, NP on 10/21/2017.  Given her advanced age and severe MR medical therapy has been continued without plans for surgical intervention.  She has since purchased a scooter to allow for improved mobility.  She still living in independent living at [facility] and has been avoiding switching to assisted living.    When I saw her in f/u I had a long discussion with both she and her son concerning potential fall risk and consideration for possible discontinuance of anticoagulation.  At her much discussion, they preferred to stay on anticoagulation therapy.  She was admitted to [facility] on December 06, 2017 and was hospitalized until December 17, 2017.  She presented with GI bleed with a hemoglobin of 4 and ultimately received 3 units of packed  red blood cell transfusion.  Her hemoglobin improved.  Warfarin was placed on hold.  She was not found to have significant abnormality on endoscopy although she had a hiatal hernia without acute source of bleeding.  At the time colonoscopy was felt to be too high risk but if bleeding occurs this would be undertaken. Subsequently, she developed atrial fibrillation.  I had seen her during her hospitalization and recommended discontinuance of warfarin therapy and that it not be reinstituted.  In the hospital, atrial fibrillation rate was initially treated with IV Cardizem as well as initiation of digoxin.  She later was started on amiodarone for rate control which apparently converted her back to sinus rhythm.  She was discharged on December 17, 2017 and initially went to assisted  living.  She has not had any further blood loss or drop in hemoglobin since her warfarin has been discontinued.  She denies any anginal type symptoms.  She has previously documentation of mitral regurgitation which may provide some reduction in potential left atrial stasis if she were to return into A. fib.   I saw her for her initial post hospital follow-up evaluation on Dec 23, 2017.  At that time, she was maintaining sinus rhythm.  She was on amiodarone 200 mg twice a day I recommended she stay on that dose for a total of 4 weeks with plans to reduce to 300 mg at the beginning of June.  She subsequently called the office and stated that she felt that the amiodarone may be causing her eyesight to working and she felt a little "shaky. "  As result her dose was reduced to 200 mg.  She is unaware of any recurrent atrial fibrillation.  She denies any chest pressure.  Her eyesight is improved on her reduced dose.  She has noticed a mild hand tremor.  She is no longer in assisted living and is now back to independent living in her retirement community.    When seen in June 2019 she was maintaining sinus rhythm and I reduced her amiodarone down  to 200 mg alternating with 100 mg every other day.  She has continued to feel well.  She is in independent living.  At time she notices the room spinning.  She is unaware of postural symptoms.  He is unaware of any recurrent atrial fibrillation.  She denies chest pressure.   When I last saw her in October 2019 she had unequal blood pressures in her right arm and left arm at 184/78 on the right and 160/80 on the left.  As result she underwent carotid and upper extremity Doppler studies which suggested mild left subclavian stenosis.   She has been residing at [facility] in independent living.  Her son is there  with her today for this telemedicine visit.  Presently, she denies chest pain.  She denies any discomfort down her left arm.  She admits that she has episodes where shakes more particularly with stressful situations.  She denies significant swelling.  She does experience some mild shortness of breath with activity.  The patient does not have symptoms concerning for COVID-19 infection (fever, chills, cough, or new shortness of breath).    Past Medical History:  Diagnosis Date   Abnormality of gait 09/07/2015   Anemia, iron deficiency 09/16/2014   Anxiety    Arthritis    "hands and feet" (12/06/2017)   CAD (coronary artery disease)    a. 08/2014 NSTEMI/Cath: mild Ca2+ or RCA ostium, otw nl cors. CO 3.3 L/min (thermo), 3.7 L/min (Fick).   Carotid arterial disease ([place]) 07/30/2012   carotid doppler; normal study   CHF (congestive heart failure) (HCC)    Chronic anticoagulation, with coumadin, with PAF and CHADS2Vasc2 score of 4 09/16/2014   Chronic back pain    "all over" (11/16/5186)   Complication of anesthesia    "they had hard time waking me up" (12/06/2017)   Degenerative arthritis    Diverticulosis    Essential hypertension    GERD (gastroesophageal reflux disease)    History of blood transfusion 12/06/2017   History of hiatal hernia    History of nuclear  stress test 09/19/2007   normal pattern of perfusion; post-stress EF 86%; EKG negative for ischemia; low risk scan   Hyperlipidemia    Left carotid bruit    a. 07/2015 Carotid U/S: 1-39% bilat ICA stenosis.   Memory disorder 03/05/2014   Mild renal insufficiency    Mitral prolapse    Neuropathy    "hands and feet" (12/06/2017)   PAF (paroxysmal atrial fibrillation) ([place])    a. 08/2014-->Coumadin (CHA2DS2VASc = 4).   Peripheral neuropathy    Small fiber    Pre-syncope    a. 04/2015 in setting of bradycardia-->CCB/BB doses adjusted.   Pulmonary hypertension (HCC)    Seasonal allergies    Skin cancer    "burned off my face" (12/06/2017)   Valvular heart disease    a. 04/2015 Echo: EF 65-70%, no rwma, Gr1 DD, mild AS, triv AI, mild MS, mod MR, mod dil LA, mod TR, sev increased PASP.   Past Surgical History:  Procedure Laterality Date   ABDOMINAL HYSTERECTOMY     APPENDECTOMY     BUNIONECTOMY Bilateral    CARPAL TUNNEL RELEASE Left    CHOLECYSTECTOMY OPEN     DILATION AND CURETTAGE OF UTERUS     ESOPHAGOGASTRODUODENOSCOPY N/A 12/10/2017   Procedure: ESOPHAGOGASTRODUODENOSCOPY (EGD);  Surgeon: Clarene Essex, MD;  Location: [facility];  Service: Endoscopy;  Laterality: N/A;   FERTILITY SURGERY     resuspension procedure    FOOT FRACTURE SURGERY Left    FRACTURE SURGERY     JOINT REPLACEMENT     LEFT HEART CATHETERIZATION WITH CORONARY ANGIOGRAM N/A 09/14/2014   Procedure: LEFT HEART CATHETERIZATION WITH CORONARY ANGIOGRAM;  Surgeon: Troy Sine, MD;  Location: [facility] CATH LAB;  Service: Cardiovascular;  Laterality: N/A;   REPLACEMENT TOTAL KNEE Right 03/2008   Archie Endo 12/19/2010   ROBOTIC ASSISTED BILATERAL SALPINGO OOPHERECTOMY Bilateral 02/19/2017   Procedure: XI ROBOTIC ASSISTED BILATERAL SALPINGO OOPHORECTOMY AND LYSIS OF ADHESION;  Surgeon: Everitt Amber, MD;  Location: [facility];  Service: Gynecology;  Laterality: Bilateral;   TEAR DUCT PROBING Left     TONSILLECTOMY     UMBILICAL HERNIA REPAIR  Current Meds  Medication Sig   acetaminophen (TYLENOL) 500 MG tablet Take 500 mg by mouth 4 (four) times daily.    amiodarone (PACERONE) 100 MG tablet Take 1 tablet (100 mg total) by mouth daily.   amLODipine (NORVASC) 2.5 MG tablet Take 1 tablet (2.5 mg total) by mouth daily.   amLODipine (NORVASC) 2.5 MG tablet TAKE 1 TABLET BY MOUTH ONCE DAILY FOR 90 DAYS   atorvastatin (LIPITOR) 10 MG tablet Take by mouth daily. 1/2 tab daily   Calcium Carb-Cholecalciferol (CALCIUM 600+D) 600-800 MG-UNIT TABS Take 1 tablet by mouth daily.   cetirizine (ZYRTEC) 10 MG tablet Take 10 mg by mouth daily with breakfast.    cholecalciferol (VITAMIN D) 1000 UNITS tablet Take 1,000 Units by mouth daily.     colestipol (COLESTID) 1 G tablet Take 1 g by mouth See admin instructions. Take one tablet (1 g) by mouth three times daily - morning, noon and bedtime (do not take with warfarin) when needed   cycloSPORINE (RESTASIS) 0.05 % ophthalmic emulsion Place 1 drop into both eyes 2 (two) times daily.    doxycycline (VIBRA-TABS) 100 MG tablet Take 1 tablet (100 mg total) by mouth 2 (two) times daily.   Emollient (AVEENO ADVANCED CARE EX) Apply 1 application topically 2 (two) times daily as needed (dry skin (on feet and after showering)).    ENSURE PLUS (ENSURE PLUS) LIQD Take 237 mLs by mouth daily. At lunch   famotidine (PEPCID) 20 MG tablet TAKE 1 TABLET BY MOUTH ONCE DAILY AT BEDTIME AS NEEDED FOR 90 DAYS   fluticasone (FLONASE) 50 MCG/ACT nasal spray Place 2 sprays into both nostrils 2 (two) times daily.   gabapentin (NEURONTIN) 300 MG capsule TAKE 1 CAPSULE THREE TIMES DAILY AND 3 CAPSULES AT BEDTIME   LACTOBACILLUS RHAMNOSUS, GG, PO Take 1 capsule by mouth daily.   levETIRAcetam (KEPPRA) 250 MG tablet 1/2 tablet in the morning and 2 in the evening (Patient taking differently: 1 tablet in the morning and 2 in the evening)   lidocaine-prilocaine  (EMLA) cream Apply 1 application topically as needed.   Loperamide HCl (LOPERAMIDE A-D PO) Take by mouth.   memantine (NAMENDA) 10 MG tablet TAKE 1 TABLET TWICE DAILY   memantine (NAMENDA) 5 MG tablet Take 5 mg by mouth 2 (two) times daily.   mirtazapine (REMERON) 7.5 MG tablet Take 3.75 mg by mouth at bedtime.    Multiple Vitamin (MULTIVITAMIN WITH MINERALS) TABS tablet Take 1 tablet by mouth daily.   Multiple Vitamin (MULTIVITAMIN) tablet Take by mouth.   pantoprazole (PROTONIX) 40 MG tablet Take 40 mg by mouth daily.    Peppermint Oil (IBGARD PO) Take 1 tablet by mouth 2 (two) times daily.   ranitidine (ZANTAC) 150 MG tablet Take 150 mg by mouth 2 (two) times daily.    simethicone (MYLICON) 354 MG chewable tablet Chew 125 mg by mouth daily as needed for flatulence.    vitamin B-12 (CYANOCOBALAMIN) 1000 MCG tablet Take 1,000 mcg by mouth daily.     Allergies:   Avelox [moxifloxacin hcl in nacl], Moxifloxacin, Aricept [donepezil hcl], Codeine, Donepezil, Garlic, Latex, Onion, Sulfa drugs cross reactors, Duloxetine, and Polysporin [bacitracin-polymyxin b]   Social History   Tobacco Use   Smoking status: Former Smoker    Types: Cigarettes   Smokeless tobacco: Never Used   Tobacco comment: "quit smoking ~ 1948  Substance Use Topics   Alcohol use: No   Drug use: No     Family Hx: The patient's family  history includes Arthritis in her sister; Cancer in her father; Coronary artery disease in her mother; Heart disease in her father; Hypertension in her father and mother; Pneumonia in her mother.  ROS:   Please see the history of present illness.    No fevers chills night sweats No cough, change in sense of taste or smell No wheezing Mild shortness of breath with activity 1 month ago, improved No palpitations No chest discomfort No bleeding No recent falls Positive for peripheral neuropathy Positive for mild tremor and shakes Sleeping well All other systems  reviewed and are negative.   Prior CV studies:   The following studies were reviewed today:  CAROTID DOPPLER Summary: 06/11/2018 Right Carotid: Velocities in the right ICA are consistent with a 1-39% stenosis.  Left Carotid: Velocities in the left ICA are consistent with a 1-39% stenosis.  Vertebrals:  Right vertebral artery demonstrates antegrade flow. Antegrade,              aberrant flow on the left. Subclavians: Left subclavian artery was stenotic. Left subclavian artery flow              was disturbed. Normal flow hemodynamics were seen in the right              subclavian artery.   UPPER EXTREMITY DOPPLER: 06/04/2018 Right Doppler Findings: +--------+--------+-----+---------+-----------------+  Site     Pressure Index Doppler   Comments           +--------+--------+-----+---------+-----------------+  Brachial 158                                         +--------+--------+-----+---------+-----------------+  Radial   164      1.04  triphasic                    +--------+--------+-----+---------+-----------------+  Ulnar    153      0.97  triphasic                    +--------+--------+-----+---------+-----------------+  Digit    94       0.59            Abnormal waveform  +--------+--------+-----+---------+-----------------+   +---+----+   WBI 1.04   +---+----+  Left Doppler Findings: +-----------+--------+-----+-----------+-----------------+  Site        Pressure Index Doppler     Comments           +-----------+--------+-----+-----------+-----------------+  Subclavian                                                +-----------+--------+-----+-----------+-----------------+  Axillary                                                  +-----------+--------+-----+-----------+-----------------+  Brachial    145            biphasic                       +-----------+--------+-----+-----------+-----------------+  Forearm                                                    +-----------+--------+-----+-----------+-----------------+  Radial      133      0.84  triphasic                      +-----------+--------+-----+-----------+-----------------+  Ulnar       142      0.90  multiphasic                    +-----------+--------+-----+-----------+-----------------+  Palmar Arch                                               +-----------+--------+-----+-----------+-----------------+  Digit       115      0.73              Abnormal waveform  +-----------+--------+-----+-----------+-----------------+   +---+---+   WBI .90   +---+---+  Summary:   Right: No significant arterial obstruction detected in the right        upper extremity. Left: Mild arterial obstruction detected in the left upper       extremity. *See table(s) above for measurements and observations. Vascular consult recommended  ------------------------------------------------------------------- ECHO Study Conclusions 06/09/2018  - Left ventricle: The cavity size was normal. There was moderate   concentric hypertrophy. Systolic function was normal. Wall motion   was normal; there were no regional wall motion abnormalities.   Features are consistent with a pseudonormal left ventricular   filling pattern, with concomitant abnormal relaxation and   increased filling pressure (grade 2 diastolic dysfunction).   Doppler parameters are consistent with high ventricular filling   pressure. - Aortic valve: Valve mobility was restricted. There was moderate   stenosis. There was no regurgitation. Peak velocity (S): 329   cm/s. Mean gradient (S): 20 mm Hg. Valve area (VTI): 0.71 cm^2.   Valve area (Vmax): 0.66 cm^2. Valve area (Vmean): 0.72 cm^2. - Mitral valve: Moderately calcified annulus. Mildly thickened   leaflets . Transvalvular velocity was within the normal range.   There was no evidence for stenosis. There was moderate   regurgitation. Valve area by pressure half-time: 2.06 cm^2. Valve    area by continuity equation (using LVOT flow): 1.61 cm^2. - Left atrium: The atrium was severely dilated. - Right ventricle: The cavity size was mildly dilated. Wall   thickness was normal. Systolic function was normal. - Atrial septum: No defect or patent foramen ovale was identified. - Tricuspid valve: There was moderate regurgitation. - Pulmonary arteries: Systolic pressure was moderately increased.   PA peak pressure: 56 mm Hg (S).   Labs/Other Tests and Data Reviewed:    EKG:  An ECG dated 05/26/2018 was personally reviewed today and demonstrated:  Normal sinus rhythm at 60 bpm, Q waves 1 and aVL  Recent Labs: 01/26/2019: ALT 15; BUN 20; Creatinine, Ser 0.89; Potassium 4.4; Sodium 140; TSH 3.180   Recent Lipid Panel Lab Results  Component Value Date/Time   CHOL 111 12/11/2016 07:07 AM   TRIG 71 12/11/2016 07:07 AM   HDL 66 12/11/2016 07:07 AM   CHOLHDL 1.7 12/11/2016 07:07 AM   LDLCALC 31 12/11/2016 07:07 AM    Wt Readings from Last 3 Encounters:  02/05/19 118 lb (53.5 kg)  01/26/19 117 lb (53.1 kg)  07/30/18 118 lb 6.4 oz (53.7 kg)     Objective:    Vital Signs:  BP 124/73    Pulse 73  Ht 5' 2.5" (1.588 m)    Wt 118 lb (53.5 kg)    BMI 21.24 kg/m    Repeat blood pressure was 124/73 in the left arm and 126/62 in the right arm  This was a phone visit and I could not visually see the patient. A general appearance is essentially unchanged according to her son Her breathing was normal and not labored I did not hear any audible wheezing She has a mild tremor which intensifies with anxiety No chest wall tenderness No abdominal tenderness No leg swelling according to the son History of peripheral neuropathy followed by Dr. Jannifer Franklin Alert with normal affect; speech is soft  ASSESSMENT & PLAN:    1. PAF: Maintaining sinus rhythm.  Pulse was regular, unable to obtain EKG today due to this telemedicine visit; continues to be on amiodarone 100 mg daily.  Recent TSH was  normal at 3.1. 2. Mild left subclavian stenosis: I reviewed with her the carotid and upper extremity arterial Doppler studies.  Medical therapy. 3. Aortic stenosis: I reviewed echo of October 2018 kidney 2020.  No chest pain, presyncope or syncope. 4. Essential hypertension: Blood pressure normal on low-dose amlodipine 5. Moderate mitral regurgitation 6. Pulmonary hypertension: Peak PA pressure 56 mm on most recent echo 7. Peripheral neuropathy: On gabapentin followed by Dr. Floyde Parkins 8. Mild tremor: TSH normal on amiodarone 9. Dementia: On Namenda  COVID-19 Education: The signs and symptoms of COVID-19 were discussed with the patient and how to seek care for testing (follow up with PCP or arrange E-visit).  The importance of social distancing was discussed today.  Time:   Today, I have spent 27 minutes with the patient and her son with telehealth technology discussing the above problems.     Medication Adjustments/Labs and Tests Ordered: Current medicines are reviewed at length with the patient today.  Concerns regarding medicines are outlined above.   Tests Ordered: No orders of the defined types were placed in this encounter.   Medication Changes: No orders of the defined types were placed in this encounter.   Follow Up:  6 months  Signed, Shelva Majestic, MD  02/05/2019 12:39 PM    [place]

## 2019-02-08 ENCOUNTER — Encounter (HOSPITAL_COMMUNITY): Payer: Self-pay | Admitting: Emergency Medicine

## 2019-02-08 ENCOUNTER — Emergency Department (HOSPITAL_COMMUNITY)
Admission: EM | Admit: 2019-02-08 | Discharge: 2019-02-09 | Disposition: A | Discharge status: Home / Self Care | Payer: Medicare Other | Attending: Emergency Medicine | Admitting: Emergency Medicine

## 2019-02-08 ENCOUNTER — Other Ambulatory Visit: Payer: Self-pay

## 2019-02-08 ENCOUNTER — Emergency Department (HOSPITAL_COMMUNITY): Payer: Medicare Other

## 2019-02-08 DIAGNOSIS — I252 Old myocardial infarction: Secondary | ICD-10-CM | POA: Diagnosis not present

## 2019-02-08 DIAGNOSIS — Z91040 Latex allergy status: Secondary | ICD-10-CM | POA: Diagnosis not present

## 2019-02-08 DIAGNOSIS — I2510 Atherosclerotic heart disease of native coronary artery without angina pectoris: Secondary | ICD-10-CM | POA: Diagnosis not present

## 2019-02-08 DIAGNOSIS — R1031 Right lower quadrant pain: Secondary | ICD-10-CM | POA: Diagnosis not present

## 2019-02-08 DIAGNOSIS — Z85828 Personal history of other malignant neoplasm of skin: Secondary | ICD-10-CM | POA: Diagnosis not present

## 2019-02-08 DIAGNOSIS — R52 Pain, unspecified: Secondary | ICD-10-CM | POA: Diagnosis not present

## 2019-02-08 DIAGNOSIS — R1084 Generalized abdominal pain: Secondary | ICD-10-CM | POA: Diagnosis not present

## 2019-02-08 DIAGNOSIS — Z96651 Presence of right artificial knee joint: Secondary | ICD-10-CM | POA: Insufficient documentation

## 2019-02-08 DIAGNOSIS — Z87891 Personal history of nicotine dependence: Secondary | ICD-10-CM | POA: Insufficient documentation

## 2019-02-08 DIAGNOSIS — Z79899 Other long term (current) drug therapy: Secondary | ICD-10-CM | POA: Diagnosis not present

## 2019-02-08 DIAGNOSIS — I509 Heart failure, unspecified: Secondary | ICD-10-CM | POA: Diagnosis not present

## 2019-02-08 DIAGNOSIS — K921 Melena: Secondary | ICD-10-CM | POA: Insufficient documentation

## 2019-02-08 DIAGNOSIS — N281 Cyst of kidney, acquired: Secondary | ICD-10-CM | POA: Diagnosis not present

## 2019-02-08 DIAGNOSIS — I491 Atrial premature depolarization: Secondary | ICD-10-CM | POA: Diagnosis not present

## 2019-02-08 DIAGNOSIS — R0902 Hypoxemia: Secondary | ICD-10-CM | POA: Diagnosis not present

## 2019-02-08 DIAGNOSIS — I110 Hypertensive heart disease with heart failure: Secondary | ICD-10-CM | POA: Diagnosis not present

## 2019-02-08 DIAGNOSIS — K5730 Diverticulosis of large intestine without perforation or abscess without bleeding: Secondary | ICD-10-CM | POA: Diagnosis not present

## 2019-02-08 LAB — CBC WITH DIFFERENTIAL/PLATELET
Abs Immature Granulocytes: 0 10*3/uL (ref 0.00–0.07)
Basophils Absolute: 0.1 10*3/uL (ref 0.0–0.1)
Basophils Relative: 1 %
Eosinophils Absolute: 0.1 10*3/uL (ref 0.0–0.5)
Eosinophils Relative: 1 %
HCT: 42 % (ref 36.0–46.0)
Hemoglobin: 13.9 g/dL (ref 12.0–15.0)
Immature Granulocytes: 0 %
Lymphocytes Relative: 19 %
Lymphs Abs: 1.1 10*3/uL (ref 0.7–4.0)
MCH: 31.2 pg (ref 26.0–34.0)
MCHC: 33.1 g/dL (ref 30.0–36.0)
MCV: 94.2 fL (ref 80.0–100.0)
Monocytes Absolute: 0.4 10*3/uL (ref 0.1–1.0)
Monocytes Relative: 6 %
Neutro Abs: 4 10*3/uL (ref 1.7–7.7)
Neutrophils Relative %: 73 %
Platelets: 219 10*3/uL (ref 150–400)
RBC: 4.46 MIL/uL (ref 3.87–5.11)
RDW: 12.6 % (ref 11.5–15.5)
WBC: 5.5 10*3/uL (ref 4.0–10.5)
nRBC: 0 % (ref 0.0–0.2)

## 2019-02-08 LAB — URINALYSIS, ROUTINE W REFLEX MICROSCOPIC
Bilirubin Urine: NEGATIVE
Glucose, UA: NEGATIVE mg/dL
Hgb urine dipstick: NEGATIVE
Ketones, ur: NEGATIVE mg/dL
Leukocytes,Ua: NEGATIVE
Nitrite: NEGATIVE
Protein, ur: NEGATIVE mg/dL
Specific Gravity, Urine: 1.01 (ref 1.005–1.030)
pH: 7 (ref 5.0–8.0)

## 2019-02-08 LAB — COMPREHENSIVE METABOLIC PANEL
ALT: 16 U/L (ref 0–44)
AST: 25 U/L (ref 15–41)
Albumin: 4 g/dL (ref 3.5–5.0)
Alkaline Phosphatase: 67 U/L (ref 38–126)
Anion gap: 9 (ref 5–15)
BUN: 15 mg/dL (ref 8–23)
CO2: 26 mmol/L (ref 22–32)
Calcium: 9.7 mg/dL (ref 8.9–10.3)
Chloride: 104 mmol/L (ref 98–111)
Creatinine, Ser: 0.8 mg/dL (ref 0.44–1.00)
GFR calc Af Amer: >60 mL/min (ref >60)
GFR calc non Af Amer: >60 mL/min (ref >60)
Glucose, Bld: 100 mg/dL — ABNORMAL HIGH (ref 70–99)
Potassium: 4 mmol/L (ref 3.5–5.1)
Sodium: 139 mmol/L (ref 135–145)
Total Bilirubin: 0.7 mg/dL (ref 0.3–1.2)
Total Protein: 7.4 g/dL (ref 6.5–8.1)

## 2019-02-08 LAB — POC OCCULT BLOOD, ED: Fecal Occult Bld: NEGATIVE

## 2019-02-08 LAB — LACTIC ACID, PLASMA: Lactic Acid, Venous: 0.9 mmol/L (ref 0.5–1.9)

## 2019-02-08 LAB — LIPASE, BLOOD: Lipase: 25 U/L (ref 11–51)

## 2019-02-08 MED ORDER — IOHEXOL 300 MG/ML  SOLN
100.0000 mL | Freq: Once | INTRAMUSCULAR | Status: AC | PRN
  Administered 2019-02-08: 100 mL via INTRAVENOUS

## 2019-02-08 NOTE — ED Provider Notes (Signed)
Medical screening examination/treatment/procedure(s) were conducted as a shared visit with non-physician practitioner(s) and myself.  I personally evaluated the patient during the encounter. Briefly, the patient is a 85 y.o. female with history of hypertension who presents to the ED with right-sided abdominal pain.  Patient states she had some may be darker stools however no melena on exam. Hemoccult is negative.  Hemoglobin stable at 13.  No signs of urinary tract infection.  Patient has some mild right-sided tenderness on exam of her abdomen but otherwise unremarkable exam.  CT scan showed no acute findings.  No appendicitis.  No bowel obstruction.  Patient with no significant white count.  No anemia.  Overall no concerning findings on today's work-up for abdominal pain.  Possibly musculoskeletal.  Follow-up with primary care doctor and told her return to the ED if symptoms worsen.  This chart was dictated using voice recognition software.  Despite best efforts to proofread,  errors can occur which can change the documentation meaning.     EKG Interpretation None          Lennice Sites, DO 02/08/19 2326

## 2019-02-08 NOTE — ED Triage Notes (Signed)
Pt arrives to ED from [facility] with complaints of RLQ abdominal pain since yesterday 6/20.[facility] reports pt had coffee ground stools starting today. Pt received 99mcg fentanyl and 4mg  Zofran via EMS.

## 2019-02-08 NOTE — Discharge Instructions (Signed)
Please read and follow all provided instructions.  Your diagnoses today include:  1. Right lower quadrant abdominal pain     Tests performed today include:  Blood counts and electrolytes  Blood tests to check liver and kidney function  Blood tests to check pancreas function  Urine test to look for infection - no infection  CT scan of the abdomen that does not show any significant findings or problems  Vital signs. See below for your results today.   Medications prescribed:   None  Take any prescribed medications only as directed.  Home care instructions:   Follow any educational materials contained in this packet.  Follow-up instructions: Please follow-up with your primary care provider in the next 2 days for further evaluation of your symptoms.    Return instructions:  SEEK IMMEDIATE MEDICAL ATTENTION IF:  The pain does not go away or becomes severe   A temperature above 101F develops   Repeated vomiting occurs (multiple episodes)   The pain becomes localized to portions of the abdomen. The right side could possibly be appendicitis. In an adult, the left lower portion of the abdomen could be colitis or diverticulitis.   Blood is being passed in stools or vomit (bright red or black tarry stools)   You develop chest pain, difficulty breathing, dizziness or fainting, or become confused, poorly responsive, or inconsolable (young children)  If you have any other emergent concerns regarding your health  Additional Information: Abdominal (belly) pain can be caused by many things. Your caregiver performed an examination and possibly ordered blood/urine tests and imaging (CT scan, x-rays, ultrasound). Many cases can be observed and treated at home after initial evaluation in the emergency department. Even though you are being discharged home, abdominal pain can be unpredictable. Therefore, you need a repeated exam if your pain does not resolve, returns, or worsens. Most  patients with abdominal pain don't have to be admitted to the hospital or have surgery, but serious problems like appendicitis and gallbladder attacks can start out as nonspecific pain. Many abdominal conditions cannot be diagnosed in one visit, so follow-up evaluations are very important.  Your vital signs today were: BP (!) 169/62    Pulse 72    Temp 97.8 F (36.6 C) (Oral)    Resp 18    SpO2 93%  If your blood pressure (bp) was elevated above 135/85 this visit, please have this repeated by your doctor within one month. --------------

## 2019-02-08 NOTE — ED Provider Notes (Signed)
[facility] EMERGENCY DEPARTMENT Provider Note   CSN: [id number] Arrival date & time: 02/08/19  1858     History   Chief Complaint Chief Complaint  Patient presents with   Abdominal Pain    HPI [name] is a 85 y.o. female.     Patient with history of cholecystectomy, appendectomy, hysterectomy, bilateral salpingo-oophorectomy, heart murmur, paroxysmal A. Fib on amiodarone not on anticoagulation --presents to the emergency department from her residence at [facility] independent living.  Patient states that she called the ambulance today by pressing her pendant.  She reports right lower quadrant abdominal pain and hip pain starting yesterday.  Patient denies any nausea or vomiting.  She reported "coffee-ground stools" to EMS but denies this to me.  Denies any history of urinary symptoms.  No fevers, chest pain, or shortness of breath.  The onset of this condition was acute. The course is constant. Aggravating factors: none. Alleviating factors: none.       Past Medical History:  Diagnosis Date   Abnormality of gait 09/07/2015   Anemia, iron deficiency 09/16/2014   Anxiety    Arthritis    "hands and feet" (12/06/2017)   CAD (coronary artery disease)    a. 08/2014 NSTEMI/Cath: mild Ca2+ or RCA ostium, otw nl cors. CO 3.3 L/min (thermo), 3.7 L/min ([place]).   Carotid arterial disease ([place]) 07/30/2012   carotid doppler; normal study   CHF (congestive heart failure) (HCC)    Chronic anticoagulation, with coumadin, with PAF and CHADS2Vasc2 score of 4 09/16/2014   Chronic back pain    "all over" (8/33/8250)   Complication of anesthesia    "they had hard time waking me up" (12/06/2017)   Degenerative arthritis    Diverticulosis    Essential hypertension    GERD (gastroesophageal reflux disease)    History of blood transfusion 12/06/2017   History of hiatal hernia    History of nuclear stress test 09/19/2007   normal pattern of  perfusion; post-stress EF 86%; EKG negative for ischemia; low risk scan   Hyperlipidemia    Left carotid bruit    a. 07/2015 Carotid U/S: 1-39% bilat ICA stenosis.   Memory disorder 03/05/2014   Mild renal insufficiency    Mitral prolapse    Neuropathy    "hands and feet" (12/06/2017)   PAF (paroxysmal atrial fibrillation) ([place])    a. 08/2014-->Coumadin (CHA2DS2VASc = 4).   Peripheral neuropathy    Small fiber    Pre-syncope    a. 04/2015 in setting of bradycardia-->CCB/BB doses adjusted.   Pulmonary hypertension (HCC)    Seasonal allergies    Skin cancer    "burned off my face" (12/06/2017)   Valvular heart disease    a. 04/2015 Echo: EF 65-70%, no rwma, Gr1 DD, mild AS, triv AI, mild MS, mod MR, mod dil LA, mod TR, sev increased PASP.    Patient Active Problem List   Diagnosis Date Noted   Subclavian artery stenosis, left ([place]) 07/30/2018   Elevated troponin    Atrial fibrillation with RVR ([place])    Anemia due to acute blood loss    Chronic GI bleeding 12/06/2017   Severe anemia 12/06/2017   Pelvic mass in female 02/19/2017   Gastroesophageal reflux disease without esophagitis 01/31/2016   Hoarseness 01/31/2016   CAD (coronary artery disease)    Abnormality of gait 09/07/2015   Left carotid bruit 08/10/2015   (HFpEF) heart failure with preserved ejection fraction ([place]) 05/08/2015   ARF (acute  renal failure) ([place]) 05/08/2015   Renal failure 05/08/2015   PAF (paroxysmal atrial fibrillation) ([place]) 09/16/2014   Chronic anticoagulation, with coumadin, with PAF and CHADS2Vasc2 score of 4 09/16/2014   Anemia, iron deficiency 09/16/2014   Non-ST elevation myocardial infarction (NSTEMI), initial care episode, secondary to PAF most likely 09/13/2014   NSTEMI (non-ST elevated myocardial infarction) ([place]) 09/13/2014   Memory disorder 03/05/2014   Pulmonary hypertension ([place]) 12/10/2013   Severe tricuspid regurgitation 12/10/2013   Non-rheumatic  mitral regurgitation 12/10/2013   Dyspnea on exertion 11/03/2013   Edema of both legs 11/03/2013   Valvular heart disease 11/03/2013   Bradycardia 09/02/2013   Mild aortic stenosis 03/03/2013   Moderate to severe pulmonary hypertension ([place]) 03/03/2013   Essential hypertension 03/03/2013   Mixed hyperlipidemia 03/03/2013   Polyneuropathy in other diseases classified elsewhere ([place]) 11/10/2012    Past Surgical History:  Procedure Laterality Date   ABDOMINAL HYSTERECTOMY     APPENDECTOMY     BUNIONECTOMY Bilateral    CARPAL TUNNEL RELEASE Left    CHOLECYSTECTOMY OPEN     DILATION AND CURETTAGE OF UTERUS     ESOPHAGOGASTRODUODENOSCOPY N/A 12/10/2017   Procedure: ESOPHAGOGASTRODUODENOSCOPY (EGD);  Surgeon: Clarene Essex, MD;  Location: [facility];  Service: Endoscopy;  Laterality: N/A;   FERTILITY SURGERY     resuspension procedure    FOOT FRACTURE SURGERY Left    FRACTURE SURGERY     JOINT REPLACEMENT     LEFT HEART CATHETERIZATION WITH CORONARY ANGIOGRAM N/A 09/14/2014   Procedure: LEFT HEART CATHETERIZATION WITH CORONARY ANGIOGRAM;  Surgeon: Troy Sine, MD;  Location[facility] CATH LAB;  Service: Cardiovascular;  Laterality: N/A;   REPLACEMENT TOTAL KNEE Right 03/2008   Archie Endo 12/19/2010   ROBOTIC ASSISTED BILATERAL SALPINGO OOPHERECTOMY Bilateral 02/19/2017   Procedure: XI ROBOTIC ASSISTED BILATERAL SALPINGO OOPHORECTOMY AND LYSIS OF ADHESION;  Surgeon: Everitt Amber, MD;  Location: [facility];  Service: Gynecology;  Laterality: Bilateral;   TEAR DUCT PROBING Left    TONSILLECTOMY     UMBILICAL HERNIA REPAIR       OB History   No obstetric history on file.      Home Medications    Prior to Admission medications   Medication Sig Start Date End Date Taking? Authorizing Provider  acetaminophen (TYLENOL) 500 MG tablet Take 500 mg by mouth 4 (four) times daily.     [provider]  amiodarone (PACERONE) 100 MG tablet Take 1 tablet (100 mg total) by  mouth daily. 07/30/18   Erlene Quan, PA-C  amLODipine (NORVASC) 2.5 MG tablet Take 1 tablet (2.5 mg total) by mouth daily. 05/26/18 02/05/19  Troy Sine, MD  amLODipine (NORVASC) 2.5 MG tablet TAKE 1 TABLET BY MOUTH ONCE DAILY FOR 90 DAYS 11/25/18   [provider]  atorvastatin (LIPITOR) 10 MG tablet Take by mouth daily. 1/2 tab daily    [provider]  Calcium Carb-Cholecalciferol (CALCIUM 600+D) 600-800 MG-UNIT TABS Take 1 tablet by mouth daily.    [provider]  cetirizine (ZYRTEC) 10 MG tablet Take 10 mg by mouth daily with breakfast.     [provider]  cholecalciferol (VITAMIN D) 1000 UNITS tablet Take 1,000 Units by mouth daily.      [provider]  colestipol (COLESTID) 1 G tablet Take 1 g by mouth See admin instructions. Take one tablet (1 g) by mouth three times daily - morning, noon and bedtime (do not take with warfarin) when needed    [provider]  cycloSPORINE (RESTASIS) 0.05 % ophthalmic emulsion Place 1 drop into both eyes 2 (two) times daily.     [provider]  doxycycline (VIBRA-TABS) 100 MG tablet Take 1 tablet (100 mg total) by mouth 2 (two) times daily. 12/30/18   Trula Slade, DPM  Emollient [facility] ADVANCED CARE EX) Apply 1 application topically 2 (two) times daily as needed (dry skin (on feet and after showering)).     [provider]  ENSURE PLUS (ENSURE PLUS) LIQD Take 237 mLs by mouth daily. At lunch    [provider]  famotidine (PEPCID) 20 MG tablet TAKE 1 TABLET BY MOUTH ONCE DAILY AT BEDTIME AS NEEDED FOR 90 DAYS 11/25/18   [provider]  fluticasone (FLONASE) 50 MCG/ACT nasal spray Place 2 sprays into both nostrils 2 (two) times daily.    [provider]  gabapentin (NEURONTIN) 300 MG capsule TAKE 1 CAPSULE THREE TIMES DAILY AND 3 CAPSULES AT BEDTIME 11/03/15   Dohmeier, Asencion Partridge, MD  LACTOBACILLUS RHAMNOSUS, GG, PO Take 1 capsule by mouth daily.     [provider]  levETIRAcetam (KEPPRA) 250 MG tablet 1/2 tablet in the morning and 2 in the evening Patient taking differently: 1 tablet in the morning and 2 in the evening 01/19/19   Kathrynn Ducking, MD  lidocaine-prilocaine (EMLA) cream Apply 1 application topically as needed. 04/17/18   Kathrynn Ducking, MD  Loperamide HCl (LOPERAMIDE A-D PO) Take by mouth.    [provider]  memantine (NAMENDA) 10 MG tablet TAKE 1 TABLET TWICE DAILY 08/05/18   Kathrynn Ducking, MD  memantine (NAMENDA) 5 MG tablet Take 5 mg by mouth 2 (two) times daily.    [provider]  mirtazapine (REMERON) 7.5 MG tablet Take 3.75 mg by mouth at bedtime.  08/07/17   [provider]  Multiple Vitamin (MULTIVITAMIN WITH MINERALS) TABS tablet Take 1 tablet by mouth daily.    [provider]  Multiple Vitamin (MULTIVITAMIN) tablet Take by mouth.    [provider]  pantoprazole (PROTONIX) 40 MG tablet Take 40 mg by mouth daily.  11/15/15   [provider]  Peppermint Oil (IBGARD PO) Take 1 tablet by mouth 2 (two) times daily.    [provider]  ranitidine (ZANTAC) 150 MG tablet Take 150 mg by mouth 2 (two) times daily.  07/26/16   [provider]  simethicone (MYLICON) 093 MG chewable tablet Chew 125 mg by mouth daily as needed for flatulence.     [provider]  vitamin B-12 (CYANOCOBALAMIN) 1000 MCG tablet Take 1,000 mcg by mouth daily.    [provider]    Family History Family History  Problem Relation Age of Onset   Pneumonia Mother    Coronary artery disease Mother    Hypertension Mother    Heart disease Father    Cancer Father    Hypertension Father    Arthritis Sister     Social History Social History   Tobacco Use   Smoking status: Former Smoker    Types: Cigarettes   Smokeless tobacco: Never Used   Tobacco comment: "quit smoking ~ 1948  Substance Use Topics   Alcohol use: No   Drug  use: No     Allergies   Avelox [moxifloxacin hcl in nacl], Moxifloxacin, Aricept [donepezil hcl], Codeine, Donepezil, Garlic, Latex, Onion, Sulfa drugs cross reactors, Duloxetine, and Polysporin [bacitracin-polymyxin b]   Review of Systems Review of Systems  Constitutional: Negative for fever.  HENT:  Negative for rhinorrhea and sore throat.   Eyes: Negative for redness.  Respiratory: Negative for cough.   Cardiovascular: Negative for chest pain.  Gastrointestinal: Positive for abdominal pain and blood in stool. Negative for diarrhea, nausea and vomiting.  Genitourinary: Negative for dysuria.  Musculoskeletal: Negative for myalgias.  Skin: Negative for rash.  Neurological: Negative for headaches.     Physical Exam Updated Vital Signs BP (!) 174/77 (BP Location: Right Arm)    Pulse 81    Temp 97.8 F (36.6 C) (Oral)    Resp (!) 21    SpO2 96%   Physical Exam Vitals signs and nursing note reviewed.  Constitutional:      Appearance: She is well-developed.  HENT:     Head: Normocephalic and atraumatic.  Eyes:     General:        Right eye: No discharge.        Left eye: No discharge.     Conjunctiva/sclera: Conjunctivae normal.  Neck:     Musculoskeletal: Normal range of motion and neck supple.  Cardiovascular:     Rate and Rhythm: Normal rate and regular rhythm.     Heart sounds: Normal heart sounds.  Pulmonary:     Effort: Pulmonary effort is normal.     Breath sounds: Normal breath sounds.  Abdominal:     Palpations: Abdomen is soft.     Tenderness: There is abdominal tenderness (mild) in the right lower quadrant and suprapubic area.  Skin:    General: Skin is warm and dry.  Neurological:     Mental Status: She is alert.      ED Treatments / Results  Labs (all labs ordered are listed, but only abnormal results are displayed) Labs Reviewed  COMPREHENSIVE METABOLIC PANEL - Abnormal; Notable for the following components:      Result Value   Glucose, Bld 100  (*)    All other components within normal limits  CBC WITH DIFFERENTIAL/PLATELET  LIPASE, BLOOD  LACTIC ACID, PLASMA  URINALYSIS, ROUTINE W REFLEX MICROSCOPIC  POC OCCULT BLOOD, ED    EKG None  Radiology Ct Abdomen Pelvis W Contrast  Result Date: 02/08/2019 CLINICAL DATA:  85 year old female with right lower quadrant abdominal pain. EXAM: CT ABDOMEN AND PELVIS WITH CONTRAST TECHNIQUE: Multidetector CT imaging of the abdomen and pelvis was performed using the standard protocol following bolus administration of intravenous contrast. CONTRAST:  157mL OMNIPAQUE IOHEXOL 300 MG/ML  SOLN COMPARISON:  CT of the abdomen pelvis dated 02/29/2016 FINDINGS: Lower chest: There is calcification of the mitral and aortic annulus. Coronary vascular calcifications noted. The visualized lung bases are clear. No intra-abdominal free air or free fluid. Hepatobiliary: The liver is unremarkable. Cholecystectomy. There is mild intrahepatic biliary ductal dilatation, likely post cholecystectomy. No calcified stone noted in the central CBD. Pancreas: The pancreas is atrophic. No active inflammatory changes. Spleen: Normal in size without focal abnormality. Adrenals/Urinary Tract: The adrenal glands are unremarkable. There is mild right pelvicaliectasis. There is symmetric enhancement and excretion of contrast by both kidneys. Several bilateral renal cysts as well as smaller hypodensities which are too small to characterize. The visualized ureters appear unremarkable. The urinary bladder is grossly unremarkable. Stomach/Bowel: There is a moderate size hiatal hernia. There is extensive sigmoid and colonic diverticulosis without active inflammatory changes. There is stranding of the pericolonic fat in the right hemiabdomen which is similar to prior CT, likely chronic changes and scarring. There is no bowel obstruction or active inflammation. Appendectomy. Vascular/Lymphatic: Advanced aortoiliac atherosclerotic  disease. The IVC is  unremarkable. No portal venous gas. There is no adenopathy. Reproductive: Hysterectomy. No pelvic mass. Other: Anterior abdominal wall hernia repair mesh. Musculoskeletal: Osteopenia and degenerative changes of the spine. Scoliosis. No acute osseous pathology. IMPRESSION: No acute intra-abdominal or pelvic pathology. Extensive colonic diverticulosis without active inflammatory changes. No bowel obstruction. Electronically Signed   By: Anner Crete M.D.   On: 02/08/2019 21:02    Procedures Procedures (including critical care time)  Medications Ordered in ED Medications  iohexol (OMNIPAQUE) 300 MG/ML solution 100 mL (100 mLs Intravenous Contrast Given 02/08/19 2042)     Initial Impression / Assessment and Plan / ED Course  I have reviewed the triage vital signs and the nursing notes.  Pertinent labs & imaging results that were available during my care of the patient were reviewed by me and considered in my medical decision making (see chart for details).        Patient seen and examined. Labs, CT ordered. Pt appears comfortable.   Vital signs reviewed and are as follows: BP (!) 169/62    Pulse 72    Temp 97.8 F (36.6 C) (Oral)    Resp 18    SpO2 93%   Pt discussed with and seen by Dr. Ronnald Nian.   11:58 PM CT images personally reviewed and interpreted.  Work-up is very reassuring. DRE performed by myself with RN chaperone.   Updated patient's son by telephone who will come to the emergency department to pick her up and take her back to [facility].  Questions answered.  Encourage PCP follow-up this week for recheck.  The patient was urged to return to the Emergency Department immediately with worsening of current symptoms, worsening abdominal pain, persistent vomiting, blood noted in stools, fever, or any other concerns. The patient verbalized understanding.    Final Clinical Impressions(s) / ED Diagnoses   Final diagnoses:  Right lower quadrant abdominal pain    Patient with abdominal pain. Vitals are stable, no fever. Labs reassuring. Imaging negative. No signs of dehydration, patient is tolerating PO's. Lungs are clear and no signs suggestive of PNA. Low concern for appendicitis (previous surgery), cholecystitis (previous surgery), pancreatitis, ruptured viscus, UTI, kidney stone, aortic dissection, aortic aneurysm or other emergent abdominal etiology. Supportive therapy indicated with return if symptoms worsen.    ED Discharge Orders    None       Carlisle Cater, PA-C 02/09/19 0002    Lennice Sites, DO 02/09/19 [id number]

## 2019-02-11 DIAGNOSIS — F329 Major depressive disorder, single episode, unspecified: Secondary | ICD-10-CM | POA: Diagnosis not present

## 2019-02-11 DIAGNOSIS — R1031 Right lower quadrant pain: Secondary | ICD-10-CM | POA: Diagnosis not present

## 2019-02-11 DIAGNOSIS — R413 Other amnesia: Secondary | ICD-10-CM | POA: Diagnosis not present

## 2019-02-16 ENCOUNTER — Ambulatory Visit: Payer: Self-pay | Admitting: Neurology

## 2019-02-16 DIAGNOSIS — M545 Low back pain: Secondary | ICD-10-CM | POA: Diagnosis not present

## 2019-02-16 DIAGNOSIS — R2681 Unsteadiness on feet: Secondary | ICD-10-CM | POA: Diagnosis not present

## 2019-02-16 DIAGNOSIS — R2689 Other abnormalities of gait and mobility: Secondary | ICD-10-CM | POA: Diagnosis not present

## 2019-02-16 DIAGNOSIS — M6281 Muscle weakness (generalized): Secondary | ICD-10-CM | POA: Diagnosis not present

## 2019-02-18 DIAGNOSIS — M6281 Muscle weakness (generalized): Secondary | ICD-10-CM | POA: Diagnosis not present

## 2019-02-18 DIAGNOSIS — R2681 Unsteadiness on feet: Secondary | ICD-10-CM | POA: Diagnosis not present

## 2019-02-18 DIAGNOSIS — R2689 Other abnormalities of gait and mobility: Secondary | ICD-10-CM | POA: Diagnosis not present

## 2019-02-18 DIAGNOSIS — M545 Low back pain: Secondary | ICD-10-CM | POA: Diagnosis not present

## 2019-02-23 DIAGNOSIS — M545 Low back pain: Secondary | ICD-10-CM | POA: Diagnosis not present

## 2019-02-23 DIAGNOSIS — R2689 Other abnormalities of gait and mobility: Secondary | ICD-10-CM | POA: Diagnosis not present

## 2019-02-23 DIAGNOSIS — M6281 Muscle weakness (generalized): Secondary | ICD-10-CM | POA: Diagnosis not present

## 2019-02-23 DIAGNOSIS — R2681 Unsteadiness on feet: Secondary | ICD-10-CM | POA: Diagnosis not present

## 2019-02-25 DIAGNOSIS — M545 Low back pain: Secondary | ICD-10-CM | POA: Diagnosis not present

## 2019-02-25 DIAGNOSIS — R2689 Other abnormalities of gait and mobility: Secondary | ICD-10-CM | POA: Diagnosis not present

## 2019-02-25 DIAGNOSIS — M6281 Muscle weakness (generalized): Secondary | ICD-10-CM | POA: Diagnosis not present

## 2019-02-25 DIAGNOSIS — R2681 Unsteadiness on feet: Secondary | ICD-10-CM | POA: Diagnosis not present

## 2019-03-02 ENCOUNTER — Other Ambulatory Visit: Payer: Self-pay | Admitting: Neurology

## 2019-03-09 DIAGNOSIS — R2689 Other abnormalities of gait and mobility: Secondary | ICD-10-CM | POA: Diagnosis not present

## 2019-03-09 DIAGNOSIS — M6281 Muscle weakness (generalized): Secondary | ICD-10-CM | POA: Diagnosis not present

## 2019-03-09 DIAGNOSIS — R2681 Unsteadiness on feet: Secondary | ICD-10-CM | POA: Diagnosis not present

## 2019-03-09 DIAGNOSIS — M545 Low back pain: Secondary | ICD-10-CM | POA: Diagnosis not present

## 2019-03-11 DIAGNOSIS — R2681 Unsteadiness on feet: Secondary | ICD-10-CM | POA: Diagnosis not present

## 2019-03-11 DIAGNOSIS — M545 Low back pain: Secondary | ICD-10-CM | POA: Diagnosis not present

## 2019-03-11 DIAGNOSIS — M6281 Muscle weakness (generalized): Secondary | ICD-10-CM | POA: Diagnosis not present

## 2019-03-11 DIAGNOSIS — R2689 Other abnormalities of gait and mobility: Secondary | ICD-10-CM | POA: Diagnosis not present

## 2019-03-16 DIAGNOSIS — M6281 Muscle weakness (generalized): Secondary | ICD-10-CM | POA: Diagnosis not present

## 2019-03-16 DIAGNOSIS — R2681 Unsteadiness on feet: Secondary | ICD-10-CM | POA: Diagnosis not present

## 2019-03-16 DIAGNOSIS — R2689 Other abnormalities of gait and mobility: Secondary | ICD-10-CM | POA: Diagnosis not present

## 2019-03-16 DIAGNOSIS — M545 Low back pain: Secondary | ICD-10-CM | POA: Diagnosis not present

## 2019-03-19 DIAGNOSIS — M6281 Muscle weakness (generalized): Secondary | ICD-10-CM | POA: Diagnosis not present

## 2019-03-19 DIAGNOSIS — M545 Low back pain: Secondary | ICD-10-CM | POA: Diagnosis not present

## 2019-03-19 DIAGNOSIS — R2681 Unsteadiness on feet: Secondary | ICD-10-CM | POA: Diagnosis not present

## 2019-03-19 DIAGNOSIS — R2689 Other abnormalities of gait and mobility: Secondary | ICD-10-CM | POA: Diagnosis not present

## 2019-03-23 DIAGNOSIS — M6281 Muscle weakness (generalized): Secondary | ICD-10-CM | POA: Diagnosis not present

## 2019-03-23 DIAGNOSIS — R2689 Other abnormalities of gait and mobility: Secondary | ICD-10-CM | POA: Diagnosis not present

## 2019-03-23 DIAGNOSIS — R2681 Unsteadiness on feet: Secondary | ICD-10-CM | POA: Diagnosis not present

## 2019-03-23 DIAGNOSIS — M545 Low back pain: Secondary | ICD-10-CM | POA: Diagnosis not present

## 2019-03-25 DIAGNOSIS — R2681 Unsteadiness on feet: Secondary | ICD-10-CM | POA: Diagnosis not present

## 2019-03-25 DIAGNOSIS — R2689 Other abnormalities of gait and mobility: Secondary | ICD-10-CM | POA: Diagnosis not present

## 2019-03-25 DIAGNOSIS — M6281 Muscle weakness (generalized): Secondary | ICD-10-CM | POA: Diagnosis not present

## 2019-03-25 DIAGNOSIS — M545 Low back pain: Secondary | ICD-10-CM | POA: Diagnosis not present

## 2019-03-30 DIAGNOSIS — M545 Low back pain: Secondary | ICD-10-CM | POA: Diagnosis not present

## 2019-03-30 DIAGNOSIS — R2681 Unsteadiness on feet: Secondary | ICD-10-CM | POA: Diagnosis not present

## 2019-03-30 DIAGNOSIS — R2689 Other abnormalities of gait and mobility: Secondary | ICD-10-CM | POA: Diagnosis not present

## 2019-03-30 DIAGNOSIS — M6281 Muscle weakness (generalized): Secondary | ICD-10-CM | POA: Diagnosis not present

## 2019-03-31 ENCOUNTER — Telehealth: Payer: Self-pay | Admitting: Neurology

## 2019-03-31 MED ORDER — LEVETIRACETAM 250 MG PO TABS: ORAL_TABLET | ORAL | 0 refills | Status: DC

## 2019-03-31 NOTE — Telephone Encounter (Signed)
Pt's son called with a couple of requests and the first one is that the pt's levETIRAcetam (KEPPRA) 250 MG tablet has expired and they would like it to be renewed and sent to [facility] for a 90 day supply. Also they were cleaning out old medications and messed up and threw out a bottle of memantine (NAMENDA) 10 MG tablet which was full and when they called [employer] to see if they can get it replaced they were informed that they can not refill until October but the pt only has enough to cover her until September. Please advise.

## 2019-03-31 NOTE — Telephone Encounter (Signed)
I reached out to the pt's son [name]. He states the pt accidentally through out her namenda pills and will be due for a refill in sept instead of oct.  I advised once the refill is actually due we could try and do an override with the pharmacy and see if it would go through. Pt was advised to let us know closer to the refill date of the namenda so we could attempt the override.  He also requested a 30 day supply of Keppra be submitted to [facility] and a 90 day supply be submitted to [facility]. I have sent both.

## 2019-04-01 DIAGNOSIS — R2681 Unsteadiness on feet: Secondary | ICD-10-CM | POA: Diagnosis not present

## 2019-04-01 DIAGNOSIS — M545 Low back pain: Secondary | ICD-10-CM | POA: Diagnosis not present

## 2019-04-01 DIAGNOSIS — R2689 Other abnormalities of gait and mobility: Secondary | ICD-10-CM | POA: Diagnosis not present

## 2019-04-01 DIAGNOSIS — M6281 Muscle weakness (generalized): Secondary | ICD-10-CM | POA: Diagnosis not present

## 2019-04-01 NOTE — Telephone Encounter (Signed)
Pt's son called back wanting to speak to the RN about the instruction change that he notice on the levETIRAcetam (KEPPRA) 250 MG tablet. The amount of pills is different than before. He would like to confirm it. Please advise.

## 2019-04-01 NOTE — Telephone Encounter (Signed)
I reached out to the pt's son. He wanted to confirm if the dosage of [name] should be 1/2 tab in the am and 2 pills in the evening. He reports previous rx was 1 in the am 2 in the pm. I advised the pt back on 01/21/2019 we received notice from the pharmacy that the pt's insurance was no longer covering keppra tid and we had to decrease to 2.5 tabs.  Son verbalized understanding on this and had no further questions.

## 2019-04-06 DIAGNOSIS — R2689 Other abnormalities of gait and mobility: Secondary | ICD-10-CM | POA: Diagnosis not present

## 2019-04-06 DIAGNOSIS — M545 Low back pain: Secondary | ICD-10-CM | POA: Diagnosis not present

## 2019-04-06 DIAGNOSIS — M6281 Muscle weakness (generalized): Secondary | ICD-10-CM | POA: Diagnosis not present

## 2019-04-06 DIAGNOSIS — R2681 Unsteadiness on feet: Secondary | ICD-10-CM | POA: Diagnosis not present

## 2019-04-08 DIAGNOSIS — R2689 Other abnormalities of gait and mobility: Secondary | ICD-10-CM | POA: Diagnosis not present

## 2019-04-08 DIAGNOSIS — R2681 Unsteadiness on feet: Secondary | ICD-10-CM | POA: Diagnosis not present

## 2019-04-08 DIAGNOSIS — M545 Low back pain: Secondary | ICD-10-CM | POA: Diagnosis not present

## 2019-04-08 DIAGNOSIS — M6281 Muscle weakness (generalized): Secondary | ICD-10-CM | POA: Diagnosis not present

## 2019-04-13 DIAGNOSIS — M6281 Muscle weakness (generalized): Secondary | ICD-10-CM | POA: Diagnosis not present

## 2019-04-13 DIAGNOSIS — M545 Low back pain: Secondary | ICD-10-CM | POA: Diagnosis not present

## 2019-04-13 DIAGNOSIS — R2681 Unsteadiness on feet: Secondary | ICD-10-CM | POA: Diagnosis not present

## 2019-04-13 DIAGNOSIS — R2689 Other abnormalities of gait and mobility: Secondary | ICD-10-CM | POA: Diagnosis not present

## 2019-04-30 DIAGNOSIS — L570 Actinic keratosis: Secondary | ICD-10-CM | POA: Diagnosis not present

## 2019-04-30 DIAGNOSIS — L814 Other melanin hyperpigmentation: Secondary | ICD-10-CM | POA: Diagnosis not present

## 2019-04-30 DIAGNOSIS — Z85828 Personal history of other malignant neoplasm of skin: Secondary | ICD-10-CM | POA: Diagnosis not present

## 2019-04-30 DIAGNOSIS — L821 Other seborrheic keratosis: Secondary | ICD-10-CM | POA: Diagnosis not present

## 2019-05-01 ENCOUNTER — Emergency Department (HOSPITAL_COMMUNITY): Payer: Medicare Other

## 2019-05-01 ENCOUNTER — Encounter (HOSPITAL_COMMUNITY): Payer: Self-pay | Admitting: Emergency Medicine

## 2019-05-01 ENCOUNTER — Inpatient Hospital Stay (HOSPITAL_COMMUNITY)
Admission: EM | Admit: 2019-05-01 | Discharge: 2019-05-05 | DRG: 470 | Disposition: A | Discharge status: Skilled Nursing Facility | Payer: Medicare Other | Source: Skilled Nursing Facility | Attending: Internal Medicine | Admitting: Internal Medicine

## 2019-05-01 ENCOUNTER — Other Ambulatory Visit: Payer: Self-pay

## 2019-05-01 DIAGNOSIS — M19041 Primary osteoarthritis, right hand: Secondary | ICD-10-CM | POA: Diagnosis present

## 2019-05-01 DIAGNOSIS — I2510 Atherosclerotic heart disease of native coronary artery without angina pectoris: Secondary | ICD-10-CM | POA: Diagnosis present

## 2019-05-01 DIAGNOSIS — Z9889 Other specified postprocedural states: Secondary | ICD-10-CM

## 2019-05-01 DIAGNOSIS — Z883 Allergy status to other anti-infective agents status: Secondary | ICD-10-CM

## 2019-05-01 DIAGNOSIS — G8929 Other chronic pain: Secondary | ICD-10-CM | POA: Diagnosis present

## 2019-05-01 DIAGNOSIS — Z888 Allergy status to other drugs, medicaments and biological substances status: Secondary | ICD-10-CM

## 2019-05-01 DIAGNOSIS — Z20828 Contact with and (suspected) exposure to other viral communicable diseases: Secondary | ICD-10-CM | POA: Diagnosis present

## 2019-05-01 DIAGNOSIS — Z8261 Family history of arthritis: Secondary | ICD-10-CM

## 2019-05-01 DIAGNOSIS — I6523 Occlusion and stenosis of bilateral carotid arteries: Secondary | ICD-10-CM | POA: Diagnosis present

## 2019-05-01 DIAGNOSIS — S199XXA Unspecified injury of neck, initial encounter: Secondary | ICD-10-CM | POA: Diagnosis not present

## 2019-05-01 DIAGNOSIS — M19071 Primary osteoarthritis, right ankle and foot: Secondary | ICD-10-CM | POA: Diagnosis present

## 2019-05-01 DIAGNOSIS — Z66 Do not resuscitate: Secondary | ICD-10-CM | POA: Diagnosis not present

## 2019-05-01 DIAGNOSIS — Z882 Allergy status to sulfonamides status: Secondary | ICD-10-CM

## 2019-05-01 DIAGNOSIS — I272 Pulmonary hypertension, unspecified: Secondary | ICD-10-CM | POA: Diagnosis present

## 2019-05-01 DIAGNOSIS — E782 Mixed hyperlipidemia: Secondary | ICD-10-CM | POA: Diagnosis present

## 2019-05-01 DIAGNOSIS — I10 Essential (primary) hypertension: Secondary | ICD-10-CM | POA: Diagnosis present

## 2019-05-01 DIAGNOSIS — K529 Noninfective gastroenteritis and colitis, unspecified: Secondary | ICD-10-CM | POA: Diagnosis present

## 2019-05-01 DIAGNOSIS — S72092A Other fracture of head and neck of left femur, initial encounter for closed fracture: Secondary | ICD-10-CM | POA: Diagnosis not present

## 2019-05-01 DIAGNOSIS — Z90710 Acquired absence of both cervix and uterus: Secondary | ICD-10-CM

## 2019-05-01 DIAGNOSIS — I110 Hypertensive heart disease with heart failure: Secondary | ICD-10-CM | POA: Diagnosis present

## 2019-05-01 DIAGNOSIS — I4891 Unspecified atrial fibrillation: Secondary | ICD-10-CM | POA: Diagnosis present

## 2019-05-01 DIAGNOSIS — R0902 Hypoxemia: Secondary | ICD-10-CM | POA: Diagnosis not present

## 2019-05-01 DIAGNOSIS — I348 Other nonrheumatic mitral valve disorders: Secondary | ICD-10-CM | POA: Diagnosis not present

## 2019-05-01 DIAGNOSIS — I480 Paroxysmal atrial fibrillation: Secondary | ICD-10-CM | POA: Diagnosis present

## 2019-05-01 DIAGNOSIS — Z03818 Encounter for observation for suspected exposure to other biological agents ruled out: Secondary | ICD-10-CM | POA: Diagnosis not present

## 2019-05-01 DIAGNOSIS — I5032 Chronic diastolic (congestive) heart failure: Secondary | ICD-10-CM | POA: Diagnosis present

## 2019-05-01 DIAGNOSIS — R413 Other amnesia: Secondary | ICD-10-CM | POA: Diagnosis present

## 2019-05-01 DIAGNOSIS — K219 Gastro-esophageal reflux disease without esophagitis: Secondary | ICD-10-CM | POA: Diagnosis present

## 2019-05-01 DIAGNOSIS — Z87891 Personal history of nicotine dependence: Secondary | ICD-10-CM

## 2019-05-01 DIAGNOSIS — M549 Dorsalgia, unspecified: Secondary | ICD-10-CM | POA: Diagnosis present

## 2019-05-01 DIAGNOSIS — I080 Rheumatic disorders of both mitral and aortic valves: Secondary | ICD-10-CM | POA: Diagnosis present

## 2019-05-01 DIAGNOSIS — E785 Hyperlipidemia, unspecified: Secondary | ICD-10-CM | POA: Diagnosis present

## 2019-05-01 DIAGNOSIS — W010XXA Fall on same level from slipping, tripping and stumbling without subsequent striking against object, initial encounter: Secondary | ICD-10-CM | POA: Diagnosis present

## 2019-05-01 DIAGNOSIS — S72002A Fracture of unspecified part of neck of left femur, initial encounter for closed fracture: Secondary | ICD-10-CM

## 2019-05-01 DIAGNOSIS — F0390 Unspecified dementia without behavioral disturbance: Secondary | ICD-10-CM | POA: Diagnosis present

## 2019-05-01 DIAGNOSIS — Z885 Allergy status to narcotic agent status: Secondary | ICD-10-CM

## 2019-05-01 DIAGNOSIS — M19042 Primary osteoarthritis, left hand: Secondary | ICD-10-CM | POA: Diagnosis present

## 2019-05-01 DIAGNOSIS — W19XXXA Unspecified fall, initial encounter: Secondary | ICD-10-CM | POA: Diagnosis not present

## 2019-05-01 DIAGNOSIS — Z8249 Family history of ischemic heart disease and other diseases of the circulatory system: Secondary | ICD-10-CM

## 2019-05-01 DIAGNOSIS — R52 Pain, unspecified: Secondary | ICD-10-CM | POA: Diagnosis not present

## 2019-05-01 DIAGNOSIS — F0150 Vascular dementia without behavioral disturbance: Secondary | ICD-10-CM | POA: Diagnosis present

## 2019-05-01 DIAGNOSIS — K5900 Constipation, unspecified: Secondary | ICD-10-CM | POA: Diagnosis not present

## 2019-05-01 DIAGNOSIS — Z85828 Personal history of other malignant neoplasm of skin: Secondary | ICD-10-CM

## 2019-05-01 DIAGNOSIS — Z79899 Other long term (current) drug therapy: Secondary | ICD-10-CM

## 2019-05-01 DIAGNOSIS — S0990XA Unspecified injury of head, initial encounter: Secondary | ICD-10-CM | POA: Diagnosis not present

## 2019-05-01 DIAGNOSIS — G629 Polyneuropathy, unspecified: Secondary | ICD-10-CM | POA: Diagnosis present

## 2019-05-01 DIAGNOSIS — D62 Acute posthemorrhagic anemia: Secondary | ICD-10-CM | POA: Diagnosis not present

## 2019-05-01 DIAGNOSIS — I252 Old myocardial infarction: Secondary | ICD-10-CM

## 2019-05-01 DIAGNOSIS — Z9049 Acquired absence of other specified parts of digestive tract: Secondary | ICD-10-CM

## 2019-05-01 DIAGNOSIS — Z96651 Presence of right artificial knee joint: Secondary | ICD-10-CM | POA: Diagnosis present

## 2019-05-01 DIAGNOSIS — M19072 Primary osteoarthritis, left ankle and foot: Secondary | ICD-10-CM | POA: Diagnosis present

## 2019-05-01 DIAGNOSIS — Z792 Long term (current) use of antibiotics: Secondary | ICD-10-CM

## 2019-05-01 LAB — SARS CORONAVIRUS 2 BY RT PCR (HOSPITAL ORDER, PERFORMED IN ~~LOC~~ HOSPITAL LAB): SARS Coronavirus 2: NEGATIVE

## 2019-05-01 MED ORDER — FENTANYL CITRATE (PF) 100 MCG/2ML IJ SOLN
50.0000 ug | Freq: Once | INTRAMUSCULAR | Status: AC
  Administered 2019-05-01: 50 ug via INTRAVENOUS
  Filled 2019-05-01: qty 2

## 2019-05-01 NOTE — ED Provider Notes (Signed)
[facility] DEPT Provider Note   CSN: [id number] Arrival date & time: 05/01/19  2130     History   Chief Complaint Chief Complaint  Patient presents with   Fall   Hip Pain   Leg Pain    HPI [name] is a 85 y.o. female.     85 year old female with a history of hypertension, dyslipidemia, esophageal reflux, CAD, PAF, CHF presents to the emergency department from friend's home [place] after mechanical fall.  She states that she was turning to put on her lights in the kitchen when she lost her footing and fell to the ground.  The back of her head did strike her parquet floor, but she denies any loss of consciousness.  She has had been having constant pain in her left hip which is worse with any type of movement.  She has not been weightbearing since the incident.  She is no longer on chronic anticoagulation.  C-collar placed in the field by EMS.   Fall  Hip Pain  Leg Pain   Past Medical History:  Diagnosis Date   Abnormality of gait 09/07/2015   Anemia, iron deficiency 09/16/2014   Anxiety    Arthritis    "hands and feet" (12/06/2017)   CAD (coronary artery disease)    a. 08/2014 NSTEMI/Cath: mild Ca2+ or RCA ostium, otw nl cors. CO 3.3 L/min (thermo), 3.7 L/min ([place]).   Carotid arterial disease ([place]) 07/30/2012   carotid doppler; normal study   CHF (congestive heart failure) (HCC)    Chronic anticoagulation, with coumadin, with PAF and CHADS2Vasc2 score of 4 09/16/2014   Chronic back pain    "all over" (0000000)   Complication of anesthesia    "they had hard time waking me up" (12/06/2017)   Degenerative arthritis    Diverticulosis    Essential hypertension    GERD (gastroesophageal reflux disease)    History of blood transfusion 12/06/2017   History of hiatal hernia    History of nuclear stress test 09/19/2007   normal pattern of perfusion; post-stress EF 86%; EKG negative for ischemia; low risk scan    Hyperlipidemia    Left carotid bruit    a. 07/2015 Carotid U/S: 1-39% bilat ICA stenosis.   Memory disorder 03/05/2014   Mild renal insufficiency    Mitral prolapse    Neuropathy    "hands and feet" (12/06/2017)   PAF (paroxysmal atrial fibrillation) ([place])    a. 08/2014-->Coumadin (CHA2DS2VASc = 4).   Peripheral neuropathy    Small fiber    Pre-syncope    a. 04/2015 in setting of bradycardia-->CCB/BB doses adjusted.   Pulmonary hypertension (HCC)    Seasonal allergies    Skin cancer    "burned off my face" (12/06/2017)   Valvular heart disease    a. 04/2015 Echo: EF 65-70%, no rwma, Gr1 DD, mild AS, triv AI, mild MS, mod MR, mod dil LA, mod TR, sev increased PASP.    Patient Active Problem List   Diagnosis Date Noted   Closed left femoral fracture ([place]) 05/02/2019   Subclavian artery stenosis, left ([place]) 07/30/2018   Elevated troponin    Atrial fibrillation with RVR ([place])    Anemia due to acute blood loss    Chronic GI bleeding 12/06/2017   Severe anemia 12/06/2017   Pelvic mass in female 02/19/2017   Gastroesophageal reflux disease without esophagitis 01/31/2016   Hoarseness 01/31/2016   CAD (coronary artery disease)    Abnormality of gait 09/07/2015  Left carotid bruit 08/10/2015   (HFpEF) heart failure with preserved ejection fraction ([place]) 05/08/2015   ARF (acute renal failure) ([place]) 05/08/2015   Renal failure 05/08/2015   PAF (paroxysmal atrial fibrillation) ([place]) 09/16/2014   Chronic anticoagulation, with coumadin, with PAF and CHADS2Vasc2 score of 4 09/16/2014   Anemia, iron deficiency 09/16/2014   Non-ST elevation myocardial infarction (NSTEMI), initial care episode, secondary to PAF most likely 09/13/2014   NSTEMI (non-ST elevated myocardial infarction) ([place]) 09/13/2014   Memory disorder 03/05/2014   Pulmonary hypertension ([place]) 12/10/2013   Severe tricuspid regurgitation 12/10/2013   Non-rheumatic mitral regurgitation  12/10/2013   Dyspnea on exertion 11/03/2013   Edema of both legs 11/03/2013   Valvular heart disease 11/03/2013   Bradycardia 09/02/2013   Mild aortic stenosis 03/03/2013   Moderate to severe pulmonary hypertension ([place]) 03/03/2013   Essential hypertension 03/03/2013   Mixed hyperlipidemia 03/03/2013   Polyneuropathy in other diseases classified elsewhere ([place]) 11/10/2012    Past Surgical History:  Procedure Laterality Date   ABDOMINAL HYSTERECTOMY     APPENDECTOMY     BUNIONECTOMY Bilateral    CARPAL TUNNEL RELEASE Left    CHOLECYSTECTOMY OPEN     DILATION AND CURETTAGE OF UTERUS     ESOPHAGOGASTRODUODENOSCOPY N/A 12/10/2017   Procedure: ESOPHAGOGASTRODUODENOSCOPY (EGD);  Surgeon: Clarene Essex, MD;  Location: [facility];  Service: Endoscopy;  Laterality: N/A;   FERTILITY SURGERY     resuspension procedure    FOOT FRACTURE SURGERY Left    FRACTURE SURGERY     JOINT REPLACEMENT     LEFT HEART CATHETERIZATION WITH CORONARY ANGIOGRAM N/A 09/14/2014   Procedure: LEFT HEART CATHETERIZATION WITH CORONARY ANGIOGRAM;  Surgeon: Troy Sine, MD;  Location: [facility] CATH LAB;  Service: Cardiovascular;  Laterality: N/A;   REPLACEMENT TOTAL KNEE Right 03/2008   Archie Endo 12/19/2010   ROBOTIC ASSISTED BILATERAL SALPINGO OOPHERECTOMY Bilateral 02/19/2017   Procedure: XI ROBOTIC ASSISTED BILATERAL SALPINGO OOPHORECTOMY AND LYSIS OF ADHESION;  Surgeon: Everitt Amber, MD;  Location: [facility] ORS;  Service: Gynecology;  Laterality: Bilateral;   TEAR DUCT PROBING Left    TONSILLECTOMY     UMBILICAL HERNIA REPAIR       OB History   No obstetric history on file.      Home Medications    Prior to Admission medications   Medication Sig Start Date End Date Taking? Authorizing Provider  acetaminophen (TYLENOL) 500 MG tablet Take 500 mg by mouth 4 (four) times daily.     [provider]  amiodarone (PACERONE) 100 MG tablet Take 1 tablet (100 mg total) by mouth daily. 07/30/18    Erlene Quan, PA-C  amLODipine (NORVASC) 2.5 MG tablet Take 1 tablet (2.5 mg total) by mouth daily. 05/26/18 02/05/19  Troy Sine, MD  amLODipine (NORVASC) 2.5 MG tablet TAKE 1 TABLET BY MOUTH ONCE DAILY FOR 90 DAYS 11/25/18   [provider]  atorvastatin (LIPITOR) 10 MG tablet Take by mouth daily. 1/2 tab daily    [provider]  Calcium Carb-Cholecalciferol (CALCIUM 600+D) 600-800 MG-UNIT TABS Take 1 tablet by mouth daily.    [provider]  cetirizine (ZYRTEC) 10 MG tablet Take 10 mg by mouth daily with breakfast.     [provider]  cholecalciferol (VITAMIN D) 1000 UNITS tablet Take 1,000 Units by mouth daily.      [provider]  colestipol (COLESTID) 1 G tablet Take 1 g by mouth See admin instructions. Take one tablet (1 g) by mouth three times  daily - morning, noon and bedtime (do not take with warfarin) when needed    [provider]  cycloSPORINE (RESTASIS) 0.05 % ophthalmic emulsion Place 1 drop into both eyes 2 (two) times daily.     [provider]  doxycycline (VIBRA-TABS) 100 MG tablet Take 1 tablet (100 mg total) by mouth 2 (two) times daily. 12/30/18   Trula Slade, DPM  Emollient [facility] ADVANCED CARE EX) Apply 1 application topically 2 (two) times daily as needed (dry skin (on feet and after showering)).     [provider]  ENSURE PLUS (ENSURE PLUS) LIQD Take 237 mLs by mouth daily. At lunch    [provider]  famotidine (PEPCID) 20 MG tablet TAKE 1 TABLET BY MOUTH ONCE DAILY AT BEDTIME AS NEEDED FOR 90 DAYS 11/25/18   [provider]  fluticasone (FLONASE) 50 MCG/ACT nasal spray Place 2 sprays into both nostrils 2 (two) times daily.    [provider]  gabapentin (NEURONTIN) 300 MG capsule TAKE 1 CAPSULE THREE TIMES DAILY AND 3 CAPSULES AT BEDTIME 11/03/15   Dohmeier, Asencion Partridge, MD  LACTOBACILLUS RHAMNOSUS, GG, PO Take 1 capsule by mouth daily.    [provider]    levETIRAcetam (KEPPRA) 250 MG tablet 1/2 tablet in the morning and 2 in the evening 03/31/19   Kathrynn Ducking, MD  lidocaine-prilocaine (EMLA) cream Apply 1 application topically as needed. 04/17/18   Kathrynn Ducking, MD  Loperamide HCl (LOPERAMIDE A-D PO) Take by mouth.    [provider]  memantine (NAMENDA) 10 MG tablet TAKE 1 TABLET TWICE DAILY 03/03/19   Kathrynn Ducking, MD  memantine (NAMENDA) 5 MG tablet Take 5 mg by mouth 2 (two) times daily.    [provider]  mirtazapine (REMERON) 7.5 MG tablet Take 3.75 mg by mouth at bedtime.  08/07/17   [provider]  Multiple Vitamin (MULTIVITAMIN WITH MINERALS) TABS tablet Take 1 tablet by mouth daily.    [provider]  Multiple Vitamin (MULTIVITAMIN) tablet Take by mouth.    [provider]  pantoprazole (PROTONIX) 40 MG tablet Take 40 mg by mouth daily.  11/15/15   [provider]  Peppermint Oil (IBGARD PO) Take 1 tablet by mouth 2 (two) times daily.    [provider]  ranitidine (ZANTAC) 150 MG tablet Take 150 mg by mouth 2 (two) times daily.  07/26/16   [provider]  simethicone (MYLICON) 0000000 MG chewable tablet Chew 125 mg by mouth daily as needed for flatulence.     [provider]  vitamin B-12 (CYANOCOBALAMIN) 1000 MCG tablet Take 1,000 mcg by mouth daily.    [provider]    Family History Family History  Problem Relation Age of Onset   Pneumonia Mother    Coronary artery disease Mother    Hypertension Mother    Heart disease Father    Cancer Father    Hypertension Father    Arthritis Sister     Social History Social History   Tobacco Use   Smoking status: Former Smoker    Types: Cigarettes   Smokeless tobacco: Never Used   Tobacco comment: "quit smoking ~ 1948  Substance Use Topics   Alcohol use: No   Drug use: No     Allergies   Avelox [moxifloxacin hcl in nacl], Moxifloxacin, Aricept [donepezil hcl],  Codeine, Donepezil, Garlic, Latex, Onion, Sulfa drugs cross reactors, Duloxetine, and Polysporin [bacitracin-polymyxin b]   Review of Systems Review of  Systems Ten systems reviewed and are negative for acute change, except as noted in the HPI.    Physical Exam Updated Vital Signs BP 123/79    Pulse 64    Temp 97.7 F (36.5 C) (Oral)    Resp (!) 25    Ht 5\' 2"  (1.575 m)    Wt 52.6 kg    SpO2 97%    BMI 21.22 kg/m   Physical Exam Vitals signs and nursing note reviewed.  Constitutional:      General: She is not in acute distress.    Appearance: She is well-developed. She is not diaphoretic.     Comments: Nontoxic appearing and pleasant.  HENT:     Head: Normocephalic.     Comments: No battle's sign or raccoon's eyes. Eyes:     General: No scleral icterus.    Conjunctiva/sclera: Conjunctivae normal.  Neck:     Comments: C-collar in place Cardiovascular:     Rate and Rhythm: Normal rate and regular rhythm.     Pulses: Normal pulses.  Pulmonary:     Effort: Pulmonary effort is normal. No respiratory distress.     Comments: Respirations even and unlabored Musculoskeletal:     Comments: Leg shortening and external rotation of the LLE  Skin:    General: Skin is warm and dry.     Coloration: Skin is not pale.     Findings: No erythema or rash.  Neurological:     General: No focal deficit present.     Mental Status: She is alert and oriented to person, place, and time.     Coordination: Coordination normal.     Comments: Sensation to light touch in BLE  Psychiatric:        Behavior: Behavior normal.      ED Treatments / Results  Labs (all labs ordered are listed, but only abnormal results are displayed) Labs Reviewed  BASIC METABOLIC PANEL - Abnormal; Notable for the following components:      Result Value   Potassium 5.3 (*)    Glucose, Bld 112 (*)    All other components within normal limits  CBC - Abnormal; Notable for the following components:   WBC 11.3 (*)     All other components within normal limits  SARS CORONAVIRUS 2 (HOSPITAL ORDER, [facility])    EKG None  Radiology Dg Chest 1 View  Result Date: 05/01/2019 CLINICAL DATA:  85 year female with left hip fracture. Preop evaluation. EXAM: CHEST  1 VIEW COMPARISON:  Chest radiograph dated 12/17/2017 FINDINGS: No focal consolidation, pleural effusion, pneumothorax. Borderline cardiomegaly. Calcification of the mitral annulus as well as atherosclerotic calcification of the aorta. No acute osseous pathology. Degenerative changes of spine. IMPRESSION: No active disease. Electronically Signed   By: Anner Crete M.D.   On: 05/01/2019 22:41   Dg Pelvis 1-2 Views  Result Date: 05/01/2019 CLINICAL DATA:  85 year old female with fall and left lower extremity pain. EXAM: PELVIS - 1-2 VIEW; LEFT FEMUR 2 VIEWS COMPARISON:  CT of the abdomen pelvis dated 02/08/2019 FINDINGS: Evaluation is limited due to positioning of the hips. There is a nondisplaced fracture of the left femoral neck. There is no dislocation. The bones are osteopenic. Degenerative changes of the hips and SI joints. IMPRESSION: Nondisplaced fracture of the left femoral neck. Electronically Signed   By: Anner Crete M.D.   On: 05/01/2019 22:38   Ct Head Wo Contrast  Result Date: 05/01/2019 CLINICAL DATA:  Fall EXAM:  CT HEAD WITHOUT CONTRAST CT CERVICAL SPINE WITHOUT CONTRAST TECHNIQUE: Multidetector CT imaging of the head and cervical spine was performed following the standard protocol without intravenous contrast. Multiplanar CT image reconstructions of the cervical spine were also generated. COMPARISON:  None. FINDINGS: CT HEAD FINDINGS Brain: There is no mass, hemorrhage or extra-axial collection. There is generalized atrophy without lobar predilection. There is hypoattenuation of the periventricular white matter, most commonly indicating chronic ischemic microangiopathy. Vascular: No abnormal hyperdensity  of the major intracranial arteries or dural venous sinuses. No intracranial atherosclerosis. Skull: The visualized skull base, calvarium and extracranial soft tissues are normal. Sinuses/Orbits: There are secretions within both maxillary sinuses. The right frontal sinus is opacified. Partial opacification of the ethmoid air cells. No mastoid or middle ear effusion. The orbits are normal. CT CERVICAL SPINE FINDINGS Alignment: There is grade 1 anterolisthesis at C5-6, likely due to facet arthrosis. Skull base and vertebrae: There is no fracture. Soft tissues and spinal canal: No prevertebral fluid or swelling. No visible canal hematoma. Disc levels: There is severe multilevel facet arthrosis. The right facets are fused at C2-C4. Disc degeneration is greatest at C5-7. No bony spinal canal stenosis. Upper chest: Biapical emphysema. Other: Normal visualized paraspinal cervical soft tissues. IMPRESSION: 1. Chronic ischemic microangiopathy and generalized atrophy without acute intracranial abnormality. 2. No acute fracture of the cervical spine. Electronically Signed   By: Ulyses Jarred M.D.   On: 05/01/2019 23:37   Ct Cervical Spine Wo Contrast  Result Date: 05/01/2019 CLINICAL DATA:  Fall EXAM: CT HEAD WITHOUT CONTRAST CT CERVICAL SPINE WITHOUT CONTRAST TECHNIQUE: Multidetector CT imaging of the head and cervical spine was performed following the standard protocol without intravenous contrast. Multiplanar CT image reconstructions of the cervical spine were also generated. COMPARISON:  None. FINDINGS: CT HEAD FINDINGS Brain: There is no mass, hemorrhage or extra-axial collection. There is generalized atrophy without lobar predilection. There is hypoattenuation of the periventricular white matter, most commonly indicating chronic ischemic microangiopathy. Vascular: No abnormal hyperdensity of the major intracranial arteries or dural venous sinuses. No intracranial atherosclerosis. Skull: The visualized skull base,  calvarium and extracranial soft tissues are normal. Sinuses/Orbits: There are secretions within both maxillary sinuses. The right frontal sinus is opacified. Partial opacification of the ethmoid air cells. No mastoid or middle ear effusion. The orbits are normal. CT CERVICAL SPINE FINDINGS Alignment: There is grade 1 anterolisthesis at C5-6, likely due to facet arthrosis. Skull base and vertebrae: There is no fracture. Soft tissues and spinal canal: No prevertebral fluid or swelling. No visible canal hematoma. Disc levels: There is severe multilevel facet arthrosis. The right facets are fused at C2-C4. Disc degeneration is greatest at C5-7. No bony spinal canal stenosis. Upper chest: Biapical emphysema. Other: Normal visualized paraspinal cervical soft tissues. IMPRESSION: 1. Chronic ischemic microangiopathy and generalized atrophy without acute intracranial abnormality. 2. No acute fracture of the cervical spine. Electronically Signed   By: Ulyses Jarred M.D.   On: 05/01/2019 23:37   Dg Femur Min 2 Views Left  Result Date: 05/01/2019 CLINICAL DATA:  85 year old female with fall and left lower extremity pain. EXAM: PELVIS - 1-2 VIEW; LEFT FEMUR 2 VIEWS COMPARISON:  CT of the abdomen pelvis dated 02/08/2019 FINDINGS: Evaluation is limited due to positioning of the hips. There is a nondisplaced fracture of the left femoral neck. There is no dislocation. The bones are osteopenic. Degenerative changes of the hips and SI joints. IMPRESSION: Nondisplaced fracture of the left femoral neck. Electronically Signed   By: Milas Hock  Radparvar M.D.   On: 05/01/2019 22:38    Procedures Procedures (including critical care time)  Medications Ordered in ED Medications  fentaNYL (SUBLIMAZE) injection 50 mcg (50 mcg Intravenous Given 05/01/19 2247)  fentaNYL (SUBLIMAZE) injection 50 mcg (50 mcg Intravenous Given 05/02/19 0028)    10:26 PM X-ray reviewed by myself.  This is consistent with an intertrochanteric fracture on  the left.  Patient will require admission to [facility] as well as operative management by orthopedics.  11:50 PM Case discussed with Dr. Lorin Mercy of orthopedics.  Patient will be seen in the morning to discuss operative management.  12:02 AM Attempted to contact son at number listed in patient's chart with no answer.   Initial Impression / Assessment and Plan / ED Course  I have reviewed the triage vital signs and the nursing notes.  Pertinent labs & imaging results that were available during my care of the patient were reviewed by me and considered in my medical decision making (see chart for details).        85 year old female presents following a mechanical fall.  She resides at [facility]  Work-up significant for left femoral neck fracture.  She will be admitted to [facility] with anticipation for operative management.  Dr. Lorin Mercy of orthopedics notified.  Patient has remained comfortable following administration of morphine.   Final Clinical Impressions(s) / ED Diagnoses   Final diagnoses:  Closed fracture of neck of left femur, initial encounter [facility])    ED Discharge Orders    None       Antonietta Breach, PA-C 05/02/19 0101    Fatima Blank, MD 05/03/19 [phone]

## 2019-05-01 NOTE — ED Notes (Signed)
Per lab, CBC and BMP hemolyzed. Labs to be redrawn.

## 2019-05-01 NOTE — ED Notes (Signed)
Type and screen drawn with lab work

## 2019-05-01 NOTE — ED Triage Notes (Signed)
Patient presents from home s/p fall. Patient was in her kitchen and turned too quickly causing a mechanical fall. Patient endorses hitting the back of her head. No LOC or blood thinners. Left leg noted to be shortened on arrival. PMS intact.

## 2019-05-02 ENCOUNTER — Other Ambulatory Visit: Payer: Self-pay

## 2019-05-02 ENCOUNTER — Encounter (HOSPITAL_COMMUNITY): Payer: Self-pay | Admitting: *Deleted

## 2019-05-02 ENCOUNTER — Inpatient Hospital Stay (HOSPITAL_COMMUNITY): Payer: Medicare Other

## 2019-05-02 ENCOUNTER — Inpatient Hospital Stay (HOSPITAL_COMMUNITY): Payer: Medicare Other | Admitting: Anesthesiology

## 2019-05-02 ENCOUNTER — Encounter (HOSPITAL_COMMUNITY)
Admission: EM | Disposition: A | Discharge status: Skilled Nursing Facility | Payer: Self-pay | Source: Skilled Nursing Facility | Attending: Internal Medicine

## 2019-05-02 DIAGNOSIS — S72002A Fracture of unspecified part of neck of left femur, initial encounter for closed fracture: Secondary | ICD-10-CM | POA: Diagnosis not present

## 2019-05-02 DIAGNOSIS — E782 Mixed hyperlipidemia: Secondary | ICD-10-CM | POA: Diagnosis not present

## 2019-05-02 DIAGNOSIS — I4891 Unspecified atrial fibrillation: Secondary | ICD-10-CM

## 2019-05-02 DIAGNOSIS — I2510 Atherosclerotic heart disease of native coronary artery without angina pectoris: Secondary | ICD-10-CM | POA: Diagnosis not present

## 2019-05-02 DIAGNOSIS — K529 Noninfective gastroenteritis and colitis, unspecified: Secondary | ICD-10-CM | POA: Diagnosis present

## 2019-05-02 DIAGNOSIS — S7292XA Unspecified fracture of left femur, initial encounter for closed fracture: Secondary | ICD-10-CM | POA: Insufficient documentation

## 2019-05-02 DIAGNOSIS — M549 Dorsalgia, unspecified: Secondary | ICD-10-CM | POA: Diagnosis present

## 2019-05-02 DIAGNOSIS — S72145A Nondisplaced intertrochanteric fracture of left femur, initial encounter for closed fracture: Secondary | ICD-10-CM | POA: Diagnosis not present

## 2019-05-02 DIAGNOSIS — I361 Nonrheumatic tricuspid (valve) insufficiency: Secondary | ICD-10-CM

## 2019-05-02 DIAGNOSIS — Z66 Do not resuscitate: Secondary | ICD-10-CM | POA: Diagnosis not present

## 2019-05-02 DIAGNOSIS — F0390 Unspecified dementia without behavioral disturbance: Secondary | ICD-10-CM | POA: Diagnosis present

## 2019-05-02 DIAGNOSIS — I509 Heart failure, unspecified: Secondary | ICD-10-CM | POA: Diagnosis not present

## 2019-05-02 DIAGNOSIS — K219 Gastro-esophageal reflux disease without esophagitis: Secondary | ICD-10-CM

## 2019-05-02 DIAGNOSIS — I080 Rheumatic disorders of both mitral and aortic valves: Secondary | ICD-10-CM | POA: Diagnosis not present

## 2019-05-02 DIAGNOSIS — R0902 Hypoxemia: Secondary | ICD-10-CM | POA: Diagnosis not present

## 2019-05-02 DIAGNOSIS — R2681 Unsteadiness on feet: Secondary | ICD-10-CM | POA: Diagnosis not present

## 2019-05-02 DIAGNOSIS — I10 Essential (primary) hypertension: Secondary | ICD-10-CM | POA: Diagnosis not present

## 2019-05-02 DIAGNOSIS — G63 Polyneuropathy in diseases classified elsewhere: Secondary | ICD-10-CM | POA: Diagnosis not present

## 2019-05-02 DIAGNOSIS — I252 Old myocardial infarction: Secondary | ICD-10-CM | POA: Diagnosis not present

## 2019-05-02 DIAGNOSIS — Z20828 Contact with and (suspected) exposure to other viral communicable diseases: Secondary | ICD-10-CM | POA: Diagnosis not present

## 2019-05-02 DIAGNOSIS — Z96642 Presence of left artificial hip joint: Secondary | ICD-10-CM | POA: Diagnosis not present

## 2019-05-02 DIAGNOSIS — I272 Pulmonary hypertension, unspecified: Secondary | ICD-10-CM | POA: Diagnosis not present

## 2019-05-02 DIAGNOSIS — W010XXA Fall on same level from slipping, tripping and stumbling without subsequent striking against object, initial encounter: Secondary | ICD-10-CM | POA: Diagnosis present

## 2019-05-02 DIAGNOSIS — Z888 Allergy status to other drugs, medicaments and biological substances status: Secondary | ICD-10-CM | POA: Diagnosis not present

## 2019-05-02 DIAGNOSIS — R5381 Other malaise: Secondary | ICD-10-CM | POA: Diagnosis not present

## 2019-05-02 DIAGNOSIS — I480 Paroxysmal atrial fibrillation: Secondary | ICD-10-CM | POA: Diagnosis not present

## 2019-05-02 DIAGNOSIS — Z885 Allergy status to narcotic agent status: Secondary | ICD-10-CM | POA: Diagnosis not present

## 2019-05-02 DIAGNOSIS — I5032 Chronic diastolic (congestive) heart failure: Secondary | ICD-10-CM | POA: Diagnosis not present

## 2019-05-02 DIAGNOSIS — I340 Nonrheumatic mitral (valve) insufficiency: Secondary | ICD-10-CM

## 2019-05-02 DIAGNOSIS — M2550 Pain in unspecified joint: Secondary | ICD-10-CM | POA: Diagnosis not present

## 2019-05-02 DIAGNOSIS — Z882 Allergy status to sulfonamides status: Secondary | ICD-10-CM | POA: Diagnosis not present

## 2019-05-02 DIAGNOSIS — R413 Other amnesia: Secondary | ICD-10-CM | POA: Diagnosis not present

## 2019-05-02 DIAGNOSIS — G629 Polyneuropathy, unspecified: Secondary | ICD-10-CM | POA: Diagnosis present

## 2019-05-02 DIAGNOSIS — Z883 Allergy status to other anti-infective agents status: Secondary | ICD-10-CM | POA: Diagnosis not present

## 2019-05-02 DIAGNOSIS — I38 Endocarditis, valve unspecified: Secondary | ICD-10-CM | POA: Diagnosis not present

## 2019-05-02 DIAGNOSIS — R41841 Cognitive communication deficit: Secondary | ICD-10-CM | POA: Diagnosis not present

## 2019-05-02 DIAGNOSIS — K922 Gastrointestinal hemorrhage, unspecified: Secondary | ICD-10-CM | POA: Diagnosis not present

## 2019-05-02 DIAGNOSIS — R29898 Other symptoms and signs involving the musculoskeletal system: Secondary | ICD-10-CM | POA: Diagnosis not present

## 2019-05-02 DIAGNOSIS — R52 Pain, unspecified: Secondary | ICD-10-CM | POA: Diagnosis not present

## 2019-05-02 DIAGNOSIS — S72042D Displaced fracture of base of neck of left femur, subsequent encounter for closed fracture with routine healing: Secondary | ICD-10-CM | POA: Diagnosis not present

## 2019-05-02 DIAGNOSIS — D62 Acute posthemorrhagic anemia: Secondary | ICD-10-CM | POA: Diagnosis not present

## 2019-05-02 DIAGNOSIS — Z471 Aftercare following joint replacement surgery: Secondary | ICD-10-CM | POA: Diagnosis not present

## 2019-05-02 DIAGNOSIS — D509 Iron deficiency anemia, unspecified: Secondary | ICD-10-CM | POA: Diagnosis not present

## 2019-05-02 DIAGNOSIS — M19042 Primary osteoarthritis, left hand: Secondary | ICD-10-CM | POA: Diagnosis present

## 2019-05-02 DIAGNOSIS — G8929 Other chronic pain: Secondary | ICD-10-CM | POA: Diagnosis present

## 2019-05-02 DIAGNOSIS — M6281 Muscle weakness (generalized): Secondary | ICD-10-CM | POA: Diagnosis not present

## 2019-05-02 DIAGNOSIS — Z7401 Bed confinement status: Secondary | ICD-10-CM | POA: Diagnosis not present

## 2019-05-02 DIAGNOSIS — I110 Hypertensive heart disease with heart failure: Secondary | ICD-10-CM | POA: Diagnosis not present

## 2019-05-02 DIAGNOSIS — K5900 Constipation, unspecified: Secondary | ICD-10-CM | POA: Diagnosis not present

## 2019-05-02 HISTORY — PX: HIP ARTHROPLASTY: SHX981

## 2019-05-02 LAB — ECHOCARDIOGRAM COMPLETE
Height: 62 in
Weight: 1795.43 oz

## 2019-05-02 LAB — CBC
HCT: 39 % (ref 36.0–46.0)
HCT: 40.9 % (ref 36.0–46.0)
Hemoglobin: 12.7 g/dL (ref 12.0–15.0)
Hemoglobin: 13.3 g/dL (ref 12.0–15.0)
MCH: 31.1 pg (ref 26.0–34.0)
MCH: 31.1 pg (ref 26.0–34.0)
MCHC: 32.6 g/dL (ref 30.0–36.0)
MCHC: 32.5 g/dL (ref 30.0–36.0)
MCV: 95.6 fL (ref 80.0–100.0)
MCV: 95.8 fL (ref 80.0–100.0)
Platelets: 215 K/uL (ref 150–400)
Platelets: 212 10*3/uL (ref 150–400)
RBC: 4.08 MIL/uL (ref 3.87–5.11)
RBC: 4.27 MIL/uL (ref 3.87–5.11)
RDW: 12.7 % (ref 11.5–15.5)
RDW: 12.7 % (ref 11.5–15.5)
WBC: 11.3 K/uL — ABNORMAL HIGH (ref 4.0–10.5)
WBC: 13.9 10*3/uL — ABNORMAL HIGH (ref 4.0–10.5)
nRBC: 0 % (ref 0.0–0.2)
nRBC: 0 % (ref 0.0–0.2)

## 2019-05-02 LAB — COMPREHENSIVE METABOLIC PANEL
ALT: 18 U/L (ref 0–44)
AST: 25 U/L (ref 15–41)
Albumin: 4.2 g/dL (ref 3.5–5.0)
Alkaline Phosphatase: 59 U/L (ref 38–126)
Anion gap: 8 (ref 5–15)
BUN: 19 mg/dL (ref 8–23)
CO2: 28 mmol/L (ref 22–32)
Calcium: 9.7 mg/dL (ref 8.9–10.3)
Chloride: 104 mmol/L (ref 98–111)
Creatinine, Ser: 0.66 mg/dL (ref 0.44–1.00)
GFR calc Af Amer: >60 mL/min (ref >60)
GFR calc non Af Amer: >60 mL/min (ref >60)
Glucose, Bld: 139 mg/dL — ABNORMAL HIGH (ref 70–99)
Potassium: 4.4 mmol/L (ref 3.5–5.1)
Sodium: 140 mmol/L (ref 135–145)
Total Bilirubin: 0.8 mg/dL (ref 0.3–1.2)
Total Protein: 7.9 g/dL (ref 6.5–8.1)

## 2019-05-02 LAB — BASIC METABOLIC PANEL
Anion gap: 11 (ref 5–15)
BUN: 19 mg/dL (ref 8–23)
CO2: 26 mmol/L (ref 22–32)
Calcium: 9.2 mg/dL (ref 8.9–10.3)
Chloride: 102 mmol/L (ref 98–111)
Creatinine, Ser: 0.74 mg/dL (ref 0.44–1.00)
GFR calc Af Amer: >60 mL/min (ref >60)
GFR calc non Af Amer: >60 mL/min (ref >60)
Glucose, Bld: 112 mg/dL — ABNORMAL HIGH (ref 70–99)
Potassium: 5.3 mmol/L — ABNORMAL HIGH (ref 3.5–5.1)
Sodium: 139 mmol/L (ref 135–145)

## 2019-05-02 LAB — SAMPLE TO BLOOD BANK

## 2019-05-02 LAB — SURGICAL PCR SCREEN
MRSA, PCR: NEGATIVE
Staphylococcus aureus: NEGATIVE

## 2019-05-02 SURGERY — HEMIARTHROPLASTY, HIP, DIRECT ANTERIOR APPROACH, FOR FRACTURE
Anesthesia: General | Site: Hip | Laterality: Left

## 2019-05-02 MED ORDER — GABAPENTIN 300 MG PO CAPS
300.0000 mg | ORAL_CAPSULE | Freq: Three times a day (TID) | ORAL | Status: DC
  Administered 2019-05-02 – 2019-05-05 (×9): 300 mg via ORAL
  Filled 2019-05-02 (×9): qty 1

## 2019-05-02 MED ORDER — ONDANSETRON HCL 4 MG PO TABS: 4.0000 mg | ORAL_TABLET | Freq: Four times a day (QID) | ORAL | Status: DC | PRN

## 2019-05-02 MED ORDER — SODIUM CHLORIDE 0.9 % IV SOLN
INTRAVENOUS | Status: DC | PRN
  Administered 2019-05-02: 50 ug/min via INTRAVENOUS

## 2019-05-02 MED ORDER — ONDANSETRON HCL 4 MG/2ML IJ SOLN: 4.0000 mg | Freq: Four times a day (QID) | INTRAMUSCULAR | Status: DC | PRN

## 2019-05-02 MED ORDER — POVIDONE-IODINE 10 % EX SWAB: 2.0000 "application " | Freq: Once | CUTANEOUS | Status: DC

## 2019-05-02 MED ORDER — LIDOCAINE 2% (20 MG/ML) 5 ML SYRINGE
INTRAMUSCULAR | Status: DC | PRN
  Administered 2019-05-02: 40 mg via INTRAVENOUS

## 2019-05-02 MED ORDER — ONDANSETRON HCL 4 MG/2ML IJ SOLN
INTRAMUSCULAR | Status: DC | PRN
  Administered 2019-05-02: 4 mg via INTRAVENOUS

## 2019-05-02 MED ORDER — LEVETIRACETAM 500 MG PO TABS
500.0000 mg | ORAL_TABLET | Freq: Every day | ORAL | Status: DC
  Administered 2019-05-02 – 2019-05-04 (×3): 500 mg via ORAL
  Filled 2019-05-02 (×3): qty 1

## 2019-05-02 MED ORDER — DOCUSATE SODIUM 100 MG PO CAPS
100.0000 mg | ORAL_CAPSULE | Freq: Two times a day (BID) | ORAL | Status: DC
  Administered 2019-05-02 – 2019-05-03 (×2): 100 mg via ORAL
  Filled 2019-05-02 (×2): qty 1

## 2019-05-02 MED ORDER — PROPOFOL 10 MG/ML IV BOLUS
INTRAVENOUS | Status: AC
  Filled 2019-05-02: qty 20

## 2019-05-02 MED ORDER — MORPHINE SULFATE (PF) 2 MG/ML IV SOLN
1.0000 mg | INTRAVENOUS | Status: DC | PRN
  Administered 2019-05-02 (×3): 1 mg via INTRAVENOUS
  Filled 2019-05-02 (×3): qty 1

## 2019-05-02 MED ORDER — MENTHOL 3 MG MT LOZG: 1.0000 | LOZENGE | OROMUCOSAL | Status: DC | PRN

## 2019-05-02 MED ORDER — HYDROCODONE-ACETAMINOPHEN 5-325 MG PO TABS
1.0000 | ORAL_TABLET | ORAL | Status: DC | PRN
  Administered 2019-05-03: 2 via ORAL
  Filled 2019-05-02: qty 2

## 2019-05-02 MED ORDER — AMOXICILLIN-POT CLAVULANATE 875-125 MG PO TABS
1.0000 | ORAL_TABLET | Freq: Two times a day (BID) | ORAL | Status: AC
  Administered 2019-05-02 – 2019-05-03 (×4): 1 via ORAL
  Filled 2019-05-02 (×4): qty 1

## 2019-05-02 MED ORDER — COLESTIPOL HCL 1 G PO TABS
1.0000 g | ORAL_TABLET | Freq: Three times a day (TID) | ORAL | Status: DC
  Administered 2019-05-02 – 2019-05-03 (×2): 1 g via ORAL
  Filled 2019-05-02 (×5): qty 1

## 2019-05-02 MED ORDER — ACETAMINOPHEN 10 MG/ML IV SOLN
INTRAVENOUS | Status: AC
  Filled 2019-05-02: qty 100

## 2019-05-02 MED ORDER — 0.9 % SODIUM CHLORIDE (POUR BTL) OPTIME
TOPICAL | Status: DC | PRN
  Administered 2019-05-02: 1000 mL

## 2019-05-02 MED ORDER — ENOXAPARIN SODIUM 40 MG/0.4ML ~~LOC~~ SOLN
40.0000 mg | SUBCUTANEOUS | Status: DC
  Administered 2019-05-03 – 2019-05-05 (×3): 40 mg via SUBCUTANEOUS
  Filled 2019-05-02 (×3): qty 0.4

## 2019-05-02 MED ORDER — FAMOTIDINE 20 MG PO TABS
20.0000 mg | ORAL_TABLET | Freq: Every day | ORAL | Status: DC
  Administered 2019-05-02 – 2019-05-05 (×4): 20 mg via ORAL
  Filled 2019-05-02 (×4): qty 1

## 2019-05-02 MED ORDER — ENSURE PRE-SURGERY PO LIQD
296.0000 mL | Freq: Once | ORAL | Status: DC
  Filled 2019-05-02: qty 296

## 2019-05-02 MED ORDER — PHENYLEPHRINE HCL (PRESSORS) 10 MG/ML IV SOLN
INTRAVENOUS | Status: AC
  Filled 2019-05-02: qty 1

## 2019-05-02 MED ORDER — MIRTAZAPINE 15 MG PO TABS
7.5000 mg | ORAL_TABLET | Freq: Every day | ORAL | Status: DC
  Administered 2019-05-03 – 2019-05-04 (×2): 7.5 mg via ORAL
  Filled 2019-05-02 (×3): qty 1

## 2019-05-02 MED ORDER — METOCLOPRAMIDE HCL 5 MG/ML IJ SOLN: 5.0000 mg | Freq: Three times a day (TID) | INTRAMUSCULAR | Status: DC | PRN

## 2019-05-02 MED ORDER — PROPOFOL 10 MG/ML IV BOLUS
INTRAVENOUS | Status: DC | PRN
  Administered 2019-05-02: 50 mg via INTRAVENOUS

## 2019-05-02 MED ORDER — HYDROCODONE-ACETAMINOPHEN 7.5-325 MG PO TABS
1.0000 | ORAL_TABLET | ORAL | Status: DC | PRN
  Administered 2019-05-03 – 2019-05-05 (×6): 1 via ORAL
  Filled 2019-05-02: qty 2
  Filled 2019-05-02 (×5): qty 1

## 2019-05-02 MED ORDER — MEMANTINE HCL 10 MG PO TABS
10.0000 mg | ORAL_TABLET | Freq: Two times a day (BID) | ORAL | Status: DC
  Administered 2019-05-02 – 2019-05-05 (×7): 10 mg via ORAL
  Filled 2019-05-02 (×7): qty 1

## 2019-05-02 MED ORDER — PHENOL 1.4 % MT LIQD: 1.0000 | OROMUCOSAL | Status: DC | PRN

## 2019-05-02 MED ORDER — SODIUM CHLORIDE 0.45 % IV SOLN
INTRAVENOUS | Status: DC
  Administered 2019-05-02: 17:00:00 via INTRAVENOUS

## 2019-05-02 MED ORDER — PHENYLEPHRINE HCL (PRESSORS) 10 MG/ML IV SOLN
INTRAVENOUS | Status: DC | PRN
  Administered 2019-05-02: 120 ug via INTRAVENOUS

## 2019-05-02 MED ORDER — AMIODARONE HCL 100 MG PO TABS
100.0000 mg | ORAL_TABLET | Freq: Every day | ORAL | Status: DC
  Administered 2019-05-02 – 2019-05-05 (×4): 100 mg via ORAL
  Filled 2019-05-02 (×4): qty 1

## 2019-05-02 MED ORDER — FENTANYL CITRATE (PF) 100 MCG/2ML IJ SOLN
50.0000 ug | Freq: Once | INTRAMUSCULAR | Status: AC
  Administered 2019-05-02: 50 ug via INTRAVENOUS
  Filled 2019-05-02: qty 2

## 2019-05-02 MED ORDER — FENTANYL CITRATE (PF) 100 MCG/2ML IJ SOLN: 25.0000 ug | INTRAMUSCULAR | Status: DC | PRN

## 2019-05-02 MED ORDER — STERILE WATER FOR IRRIGATION IR SOLN
Status: DC | PRN
  Administered 2019-05-02: 2000 mL

## 2019-05-02 MED ORDER — SUCCINYLCHOLINE CHLORIDE 200 MG/10ML IV SOSY
PREFILLED_SYRINGE | INTRAVENOUS | Status: DC | PRN
  Administered 2019-05-02: 60 mg via INTRAVENOUS

## 2019-05-02 MED ORDER — CHLORHEXIDINE GLUCONATE 4 % EX LIQD
60.0000 mL | Freq: Once | CUTANEOUS | Status: DC
  Filled 2019-05-02: qty 60

## 2019-05-02 MED ORDER — PHENYLEPHRINE 40 MCG/ML (10ML) SYRINGE FOR IV PUSH (FOR BLOOD PRESSURE SUPPORT)
PREFILLED_SYRINGE | INTRAVENOUS | Status: AC
  Filled 2019-05-02: qty 10

## 2019-05-02 MED ORDER — METOCLOPRAMIDE HCL 5 MG PO TABS: 5.0000 mg | ORAL_TABLET | Freq: Three times a day (TID) | ORAL | Status: DC | PRN

## 2019-05-02 MED ORDER — DEXAMETHASONE SODIUM PHOSPHATE 10 MG/ML IJ SOLN
INTRAMUSCULAR | Status: AC
  Filled 2019-05-02: qty 1

## 2019-05-02 MED ORDER — CEFAZOLIN SODIUM-DEXTROSE 2-4 GM/100ML-% IV SOLN
2.0000 g | INTRAVENOUS | Status: DC
  Filled 2019-05-02 (×2): qty 100

## 2019-05-02 MED ORDER — FENTANYL CITRATE (PF) 100 MCG/2ML IJ SOLN
INTRAMUSCULAR | Status: AC
  Filled 2019-05-02: qty 4

## 2019-05-02 MED ORDER — BUPIVACAINE-EPINEPHRINE 0.5% -1:200000 IJ SOLN
INTRAMUSCULAR | Status: DC | PRN
  Administered 2019-05-02: 10 mL

## 2019-05-02 MED ORDER — MUPIROCIN 2 % EX OINT
1.0000 "application " | TOPICAL_OINTMENT | Freq: Two times a day (BID) | CUTANEOUS | Status: DC
  Administered 2019-05-02: 1 via NASAL
  Filled 2019-05-02: qty 22

## 2019-05-02 MED ORDER — LIDOCAINE 2% (20 MG/ML) 5 ML SYRINGE
INTRAMUSCULAR | Status: AC
  Filled 2019-05-02: qty 5

## 2019-05-02 MED ORDER — BUPIVACAINE-EPINEPHRINE 0.5% -1:200000 IJ SOLN
INTRAMUSCULAR | Status: AC
  Filled 2019-05-02: qty 1

## 2019-05-02 MED ORDER — FENTANYL CITRATE (PF) 100 MCG/2ML IJ SOLN
INTRAMUSCULAR | Status: DC | PRN
  Administered 2019-05-02: 25 ug via INTRAVENOUS

## 2019-05-02 MED ORDER — FLUTICASONE PROPIONATE 50 MCG/ACT NA SUSP
1.0000 | Freq: Every day | NASAL | Status: DC
  Administered 2019-05-03 – 2019-05-05 (×3): 1 via NASAL
  Filled 2019-05-02: qty 16

## 2019-05-02 MED ORDER — GABAPENTIN 300 MG PO CAPS
900.0000 mg | ORAL_CAPSULE | Freq: Every day | ORAL | Status: DC
  Administered 2019-05-02 – 2019-05-04 (×3): 900 mg via ORAL
  Filled 2019-05-02 (×3): qty 3

## 2019-05-02 MED ORDER — SUCCINYLCHOLINE CHLORIDE 200 MG/10ML IV SOSY
PREFILLED_SYRINGE | INTRAVENOUS | Status: AC
  Filled 2019-05-02: qty 10

## 2019-05-02 MED ORDER — ENOXAPARIN SODIUM 40 MG/0.4ML ~~LOC~~ SOLN: 40.0000 mg | SUBCUTANEOUS | Status: DC

## 2019-05-02 MED ORDER — LEVETIRACETAM 250 MG PO TABS: 250.0000 mg | ORAL_TABLET | ORAL | Status: DC

## 2019-05-02 MED ORDER — ATORVASTATIN CALCIUM 10 MG PO TABS
5.0000 mg | ORAL_TABLET | Freq: Every day | ORAL | Status: DC
  Administered 2019-05-03 – 2019-05-05 (×3): 5 mg via ORAL
  Filled 2019-05-02 (×3): qty 1

## 2019-05-02 MED ORDER — ALBUMIN HUMAN 5 % IV SOLN
INTRAVENOUS | Status: DC | PRN
  Administered 2019-05-02: 14:00:00 via INTRAVENOUS

## 2019-05-02 MED ORDER — ENSURE ENLIVE PO LIQD
237.0000 mL | Freq: Two times a day (BID) | ORAL | Status: DC
  Administered 2019-05-03 – 2019-05-05 (×3): 237 mL via ORAL

## 2019-05-02 MED ORDER — LEVETIRACETAM 250 MG PO TABS
250.0000 mg | ORAL_TABLET | Freq: Every day | ORAL | Status: DC
  Administered 2019-05-02 – 2019-05-05 (×4): 250 mg via ORAL
  Filled 2019-05-02 (×4): qty 1

## 2019-05-02 MED ORDER — LACTATED RINGERS IV SOLN
INTRAVENOUS | Status: DC | PRN
  Administered 2019-05-02 (×2): via INTRAVENOUS

## 2019-05-02 MED ORDER — DEXAMETHASONE SODIUM PHOSPHATE 10 MG/ML IJ SOLN
INTRAMUSCULAR | Status: DC | PRN
  Administered 2019-05-02: 5 mg via INTRAVENOUS

## 2019-05-02 MED ORDER — GABAPENTIN 300 MG PO CAPS: 300.0000 mg | ORAL_CAPSULE | ORAL | Status: DC

## 2019-05-02 MED ORDER — MORPHINE SULFATE (PF) 2 MG/ML IV SOLN
0.5000 mg | INTRAVENOUS | Status: DC | PRN
  Administered 2019-05-03: 1 mg via INTRAVENOUS
  Filled 2019-05-02: qty 1

## 2019-05-02 MED ORDER — ACETAMINOPHEN 10 MG/ML IV SOLN
INTRAVENOUS | Status: DC | PRN
  Administered 2019-05-02: 750 mg via INTRAVENOUS

## 2019-05-02 MED ORDER — ACETAMINOPHEN 500 MG PO TABS
500.0000 mg | ORAL_TABLET | Freq: Four times a day (QID) | ORAL | Status: DC
  Administered 2019-05-02 – 2019-05-03 (×3): 500 mg via ORAL
  Filled 2019-05-02 (×3): qty 1

## 2019-05-02 MED ORDER — ACETAMINOPHEN 325 MG PO TABS: 325.0000 mg | ORAL_TABLET | Freq: Four times a day (QID) | ORAL | Status: DC | PRN

## 2019-05-02 SURGICAL SUPPLY — 59 items
APL PRP STRL LF DISP 70% ISPRP (MISCELLANEOUS) ×1
BLADE SAW SGTL 73X25 THK (BLADE) ×2 IMPLANT
BRUSH FEMORAL CANAL (MISCELLANEOUS) IMPLANT
CHLORAPREP W/TINT 26 (MISCELLANEOUS) ×2 IMPLANT
COVER SURGICAL LIGHT HANDLE (MISCELLANEOUS) ×2 IMPLANT
COVER WAND RF STERILE (DRAPES) IMPLANT
DRAPE INCISE IOBAN 66X45 STRL (DRAPES) ×2 IMPLANT
DRAPE ORTHO SPLIT 77X108 STRL (DRAPES) ×4
DRAPE POUCH INSTRU U-SHP 10X18 (DRAPES) ×2 IMPLANT
DRAPE SURG ORHT 6 SPLT 77X108 (DRAPES) ×2 IMPLANT
DRAPE U-SHAPE 47X51 STRL (DRAPES) ×2 IMPLANT
DRAPE WARM FLUID 44X44 (DRAPES) ×1 IMPLANT
DRSG PAD ABDOMINAL 8X10 ST (GAUZE/BANDAGES/DRESSINGS) ×1 IMPLANT
ELECT BLADE TIP CTD 4 INCH (ELECTRODE) ×2 IMPLANT
ELECT REM PT RETURN 15FT ADLT (MISCELLANEOUS) ×2 IMPLANT
EVACUATOR 1/8 PVC DRAIN (DRAIN) ×1 IMPLANT
FACESHIELD WRAPAROUND (MASK) ×10 IMPLANT
FACESHIELD WRAPAROUND OR TEAM (MASK) ×5 IMPLANT
GAUZE SPONGE 4X4 12PLY STRL (GAUZE/BANDAGES/DRESSINGS) ×3 IMPLANT
GAUZE XEROFORM 5X9 LF (GAUZE/BANDAGES/DRESSINGS) ×2 IMPLANT
GLOVE BIOGEL PI IND STRL 8 (GLOVE) ×1 IMPLANT
GLOVE BIOGEL PI INDICATOR 8 (GLOVE) ×1
GLOVE ORTHO TXT STRL SZ7.5 (GLOVE) ×2 IMPLANT
GOWN STRL REUS W/TWL LRG LVL3 (GOWN DISPOSABLE) ×2 IMPLANT
HANDPIECE INTERPULSE COAX TIP (DISPOSABLE)
HEAD FEM UNIPOLAR 46 OD STRL (Hips) ×1 IMPLANT
IMMOBILIZER KNEE 20 (SOFTGOODS) ×2 IMPLANT
IMMOBILIZER KNEE 20 THIGH 36 (SOFTGOODS) ×1 IMPLANT
KIT BASIN OR (CUSTOM PROCEDURE TRAY) ×2 IMPLANT
KIT TURNOVER KIT A (KITS) IMPLANT
NDL MAYO CATGUT SZ4 TPR NDL (NEEDLE) ×1 IMPLANT
NEEDLE HYPO 22GX1.5 SAFETY (NEEDLE) IMPLANT
NEEDLE MAYO CATGUT SZ4 (NEEDLE) ×2 IMPLANT
NS IRRIG 1000ML POUR BTL (IV SOLUTION) ×4 IMPLANT
PACK TOTAL JOINT (CUSTOM PROCEDURE TRAY) ×2 IMPLANT
PASSER SUT SWANSON 36MM LOOP (INSTRUMENTS) ×2 IMPLANT
PROTECTOR NERVE ULNAR (MISCELLANEOUS) ×2 IMPLANT
SET HNDPC FAN SPRY TIP SCT (DISPOSABLE) IMPLANT
SPACER FEM TAPERED +0 12/14 (Hips) ×1 IMPLANT
SPONGE LAP 18X18 RF (DISPOSABLE) IMPLANT
STAPLER VISISTAT 35W (STAPLE) ×2 IMPLANT
STEM SUMMIT BASIC PRESSFIT SZ3 (Hips) IMPLANT
SUCTION FRAZIER HANDLE 12FR (TUBING) ×1
SUCTION TUBE FRAZIER 12FR DISP (TUBING) ×1 IMPLANT
SUMMIT BASIC PRESSFIT SZ3 (Hips) ×2 IMPLANT
SUT ETHIBOND NAB CT1 #1 30IN (SUTURE) ×8 IMPLANT
SUT VIC AB 0 CT1 27 (SUTURE) ×2
SUT VIC AB 0 CT1 27XBRD ANTBC (SUTURE) ×1 IMPLANT
SUT VIC AB 1 CT1 27 (SUTURE)
SUT VIC AB 1 CT1 27XBRD ANTBC (SUTURE) IMPLANT
SUT VIC AB 1 CTX 36 (SUTURE)
SUT VIC AB 1 CTX36XBRD ANBCTR (SUTURE) IMPLANT
SUT VIC AB 2-0 CT1 36 (SUTURE) ×6 IMPLANT
SYR 30ML LL (SYRINGE) IMPLANT
TAPE CLOTH SURG 6X10 WHT LF (GAUZE/BANDAGES/DRESSINGS) ×1 IMPLANT
TOWEL OR 17X26 10 PK STRL BLUE (TOWEL DISPOSABLE) ×4 IMPLANT
TOWER CARTRIDGE SMART MIX (DISPOSABLE) IMPLANT
TRAY FOLEY MTR SLVR 16FR STAT (SET/KITS/TRAYS/PACK) ×2 IMPLANT
WATER STERILE IRR 1000ML POUR (IV SOLUTION) ×2 IMPLANT

## 2019-05-02 NOTE — Transfer of Care (Signed)
Immediate Anesthesia Transfer of Care Note  Patient: [name]  Procedure(s) Performed: ARTHROPLASTY BIPOLAR HIP (HEMIARTHROPLASTY) (Left Hip)  Patient Location: PACU  Anesthesia Type:General  Level of Consciousness: awake, alert , oriented and patient cooperative  Airway & Oxygen Therapy: Patient Spontanous Breathing and Patient connected to face mask oxygen  Post-op Assessment: Report given to RN, Post -op Vital signs reviewed and stable and Patient moving all extremities X 4  Post vital signs: stable  Last Vitals:  Vitals Value Taken Time  BP 116/55 05/02/19 1500  Temp    Pulse 71 05/02/19 1507  Resp 17 05/02/19 1507  SpO2 85 % 05/02/19 1507  Vitals shown include unvalidated device data.  Last Pain:  Vitals:   05/02/19 1450  TempSrc:   PainSc: (P) 0-No pain         Complications: No apparent anesthesia complications

## 2019-05-02 NOTE — Plan of Care (Signed)
  Problem: Pain Managment: Goal: General experience of comfort will improve Outcome: Not Progressing   

## 2019-05-02 NOTE — Progress Notes (Signed)
   05/02/19 2211  MEWS Assessment  Is this an acute change? No  Post surgery

## 2019-05-02 NOTE — Progress Notes (Signed)
  Echocardiogram 2D Echocardiogram has been performed.  [name] 05/02/2019, 11:27 AM

## 2019-05-02 NOTE — Consult Note (Signed)
Reason for Consult:left femoral neck displaced fracture Referring Physician: Marlowe Sax MD  [name] is an 85 y.o. female.  HPI: 85 year old female in independent living at [facility] last had a mechanical fall presented to emergency room with a displaced angulated left femoral neck fracture.  Past history of dyslipidemia hypertension atrial fib not on anticoagulation currently but on it in the past.  Patient just had an echo and has some moderate aortic stenosis with satisfactory ejection fraction.  Patient denies loss of consciousness.  Had severe pain in her hip after the fall unable to ambulate.  Past Medical History:  Diagnosis Date   Abnormality of gait 09/07/2015   Anemia, iron deficiency 09/16/2014   Anxiety    Arthritis    "hands and feet" (12/06/2017)   CAD (coronary artery disease)    a. 08/2014 NSTEMI/Cath: mild Ca2+ or RCA ostium, otw nl cors. CO 3.3 L/min (thermo), 3.7 L/min (Fick).   Carotid arterial disease ([place]) 07/30/2012   carotid doppler; normal study   CHF (congestive heart failure) (HCC)    Chronic anticoagulation, with coumadin, with PAF and CHADS2Vasc2 score of 4 09/16/2014   Chronic back pain    "all over" (0000000)   Complication of anesthesia    "they had hard time waking me up" (12/06/2017)   Degenerative arthritis    Diverticulosis    Essential hypertension    GERD (gastroesophageal reflux disease)    History of blood transfusion 12/06/2017   History of hiatal hernia    History of nuclear stress test 09/19/2007   normal pattern of perfusion; post-stress EF 86%; EKG negative for ischemia; low risk scan   Hyperlipidemia    Left carotid bruit    a. 07/2015 Carotid U/S: 1-39% bilat ICA stenosis.   Memory disorder 03/05/2014   Mild renal insufficiency    Mitral prolapse    Neuropathy    "hands and feet" (12/06/2017)   PAF (paroxysmal atrial fibrillation) ([place])    a. 08/2014-->Coumadin (CHA2DS2VASc = 4).   Peripheral  neuropathy    Small fiber    Pre-syncope    a. 04/2015 in setting of bradycardia-->CCB/BB doses adjusted.   Pulmonary hypertension (HCC)    Seasonal allergies    Skin cancer    "burned off my face" (12/06/2017)   Valvular heart disease    a. 04/2015 Echo: EF 65-70%, no rwma, Gr1 DD, mild AS, triv AI, mild MS, mod MR, mod dil LA, mod TR, sev increased PASP.    Past Surgical History:  Procedure Laterality Date   ABDOMINAL HYSTERECTOMY     APPENDECTOMY     BUNIONECTOMY Bilateral    CARPAL TUNNEL RELEASE Left    CHOLECYSTECTOMY OPEN     DILATION AND CURETTAGE OF UTERUS     ESOPHAGOGASTRODUODENOSCOPY N/A 12/10/2017   Procedure: ESOPHAGOGASTRODUODENOSCOPY (EGD);  Surgeon: Clarene Essex, MD;  Location: [facility];  Service: Endoscopy;  Laterality: N/A;   FERTILITY SURGERY     resuspension procedure    FOOT FRACTURE SURGERY Left    FRACTURE SURGERY     JOINT REPLACEMENT     LEFT HEART CATHETERIZATION WITH CORONARY ANGIOGRAM N/A 09/14/2014   Procedure: LEFT HEART CATHETERIZATION WITH CORONARY ANGIOGRAM;  Surgeon: Troy Sine, MD;  Location: [facility] CATH LAB;  Service: Cardiovascular;  Laterality: N/A;   REPLACEMENT TOTAL KNEE Right 03/2008   Archie Endo 12/19/2010   ROBOTIC ASSISTED BILATERAL SALPINGO OOPHERECTOMY Bilateral 02/19/2017   Procedure: XI ROBOTIC ASSISTED BILATERAL SALPINGO OOPHORECTOMY AND LYSIS OF ADHESION;  Surgeon: Denman George,  Terrence Dupont, MD;  Location: [facility];  Service: Gynecology;  Laterality: Bilateral;   TEAR DUCT PROBING Left    TONSILLECTOMY     UMBILICAL HERNIA REPAIR      Family History  Problem Relation Age of Onset   Pneumonia Mother    Coronary artery disease Mother    Hypertension Mother    Heart disease Father    Cancer Father    Hypertension Father    Arthritis Sister     Social History:  reports that she has quit smoking. Her smoking use included cigarettes. She has never used smokeless tobacco. She reports that she does not drink alcohol or  use drugs.  Allergies:  Allergies  Allergen Reactions   Avelox [Moxifloxacin Hcl In [place]acl] Shortness Of Breath and Swelling   Moxifloxacin Other (See Comments), Shortness Of Breath and Swelling    other   Aricept [Donepezil Hcl] Diarrhea   Codeine Swelling        Donepezil Diarrhea   Garlic Diarrhea   Latex Hives, Itching, Rash and Other (See Comments)    Watery blisters    Onion Diarrhea   Sulfa Drugs Cross Reactors Swelling   Duloxetine Rash   Polysporin [Bacitracin-Polymyxin B] Other (See Comments)    other    Medications: I have reviewed the patient's current medications.  Results for orders placed or performed during the hospital encounter of 05/01/19 (from the past 48 hour(s))  SARS Coronavirus 2 [facility] order, Performed in [facility] hospital lab) Nasopharyngeal Nasopharyngeal Swab     Status: None   Collection Time: 05/01/19 10:47 PM   Specimen: Nasopharyngeal Swab  Result Value Ref Range   SARS Coronavirus 2 NEGATIVE NEGATIVE    Comment: (NOTE) If result is NEGATIVE SARS-CoV-2 target nucleic acids are NOT DETECTED. The SARS-CoV-2 RNA is generally detectable in upper and lower  respiratory specimens during the acute phase of infection. The lowest  concentration of SARS-CoV-2 viral copies this assay can detect is 250  copies / mL. A negative result does not preclude SARS-CoV-2 infection  and should not be used as the sole basis for treatment or other  patient management decisions.  A negative result may occur with  improper specimen collection / handling, submission of specimen other  than nasopharyngeal swab, presence of viral mutation(s) within the  areas targeted by this assay, and inadequate number of viral copies  (<250 copies / mL). A negative result must be combined with clinical  observations, patient history, and epidemiological information. If result is POSITIVE SARS-CoV-2 target nucleic acids are DETECTED. The SARS-CoV-2 RNA is generally  detectable in upper and lower  respiratory specimens dur ing the acute phase of infection.  Positive  results are indicative of active infection with SARS-CoV-2.  Clinical  correlation with patient history and other diagnostic information is  necessary to determine patient infection status.  Positive results do  not rule out bacterial infection or co-infection with other viruses. If result is PRESUMPTIVE POSTIVE SARS-CoV-2 nucleic acids MAY BE PRESENT.   A presumptive positive result was obtained on the submitted specimen  and confirmed on repeat testing.  While 2019 novel coronavirus  (SARS-CoV-2) nucleic acids may be present in the submitted sample  additional confirmatory testing may be necessary for epidemiological  and / or clinical management purposes  to differentiate between  SARS-CoV-2 and other Sarbecovirus currently known to infect humans.  If clinically indicated additional testing with an alternate test  methodology [phone]) is advised. The SARS-CoV-2 RNA is generally  detectable  in upper and lower respiratory sp ecimens during the acute  phase of infection. The expected result is Negative. Fact Sheet for Patients:  StrictlyIdeas.no Fact Sheet for Healthcare Providers: BankingDealers.co.za This test is not yet approved or cleared by the [place] FDA and has been authorized for detection and/or diagnosis of SARS-CoV-2 by FDA under an Emergency Use Authorization (EUA).  This EUA will remain in effect (meaning this test can be used) for the duration of the COVID-19 declaration under Section 564(b)(1) of the Act, 21 U.S.C. section 360bbb-3(b)(1), unless the authorization is terminated or revoked sooner. Performed at [facility], [place] [address] [place], [place] [address]   Sample to Blood Bank     Status: None   Collection Time: 05/01/19 10:50 PM  Result Value Ref Range   Blood Bank Specimen SAMPLE  AVAILABLE FOR TESTING    Sample Expiration      05/04/2019,2359 Performed at [facility], [place] [address] [place], [place] [id number]   Basic metabolic panel     Status: Abnormal   Collection Time: 05/01/19 11:24 PM  Result Value Ref Range   Sodium 139 135 - 145 mmol/L   Potassium 5.3 (H) 3.5 - 5.1 mmol/L   Chloride 102 98 - 111 mmol/L   CO2 26 22 - 32 mmol/L   Glucose, Bld 112 (H) 70 - 99 mg/dL   BUN 19 8 - 23 mg/dL   Creatinine, Ser 0.74 0.44 - 1.00 mg/dL   Calcium 9.2 8.9 - 10.3 mg/dL   GFR calc non Af Amer >60 >60 mL/min   GFR calc Af Amer >60 >60 mL/min   Anion gap 11 5 - 15    Comment: Performed at [facility], [place] [address], [place], [place] [address]  CBC     Status: Abnormal   Collection Time: 05/01/19 11:24 PM  Result Value Ref Range   WBC 11.3 (H) 4.0 - 10.5 K/uL   RBC 4.08 3.87 - 5.11 MIL/uL   Hemoglobin 12.7 12.0 - 15.0 g/dL   HCT 39.0 36.0 - 46.0 %   MCV 95.6 80.0 - 100.0 fL   MCH 31.1 26.0 - 34.0 pg   MCHC 32.6 30.0 - 36.0 g/dL   RDW 12.7 11.5 - 15.5 %   Platelets 215 150 - 400 K/uL   nRBC 0.0 0.0 - 0.2 %    Comment: Performed at [facility], [place] [address] [place], [place] [address]  Surgical PCR screen     Status: None   Collection Time: 05/02/19  2:53 AM   Specimen: Nasal Mucosa; Nasal Swab  Result Value Ref Range   MRSA, PCR NEGATIVE NEGATIVE   Staphylococcus aureus NEGATIVE NEGATIVE    Comment: (NOTE) The Xpert SA Assay (FDA approved for NASAL specimens in patients 56 years of age and older), is one component of a comprehensive surveillance program. It is not intended to diagnose infection nor to guide or monitor treatment. Performed at [facility], [place] [address], [place],  [address]   Comprehensive metabolic panel     Status: Abnormal   Collection Time: 05/02/19  3:38 AM  Result Value Ref Range   Sodium 140 135 - 145 mmol/L   Potassium 4.4 3.5 - 5.1 mmol/L      Comment: DELTA CHECK NOTED   Chloride 104 98 - 111 mmol/L   CO2 28 22 - 32 mmol/L   Glucose, Bld 139 (H) 70 - 99 mg/dL   BUN  19 8 - 23 mg/dL   Creatinine, Ser 0.66 0.44 - 1.00 mg/dL   Calcium 9.7 8.9 - 10.3 mg/dL   Total Protein 7.9 6.5 - 8.1 g/dL   Albumin 4.2 3.5 - 5.0 g/dL   AST 25 15 - 41 U/L   ALT 18 0 - 44 U/L   Alkaline Phosphatase 59 38 - 126 U/L   Total Bilirubin 0.8 0.3 - 1.2 mg/dL   GFR calc non Af Amer >60 >60 mL/min   GFR calc Af Amer >60 >60 mL/min   Anion gap 8 5 - 15    Comment: Performed at [facility], [place] [address] [place], [place] [address]  CBC     Status: Abnormal   Collection Time: 05/02/19  3:38 AM  Result Value Ref Range   WBC 13.9 (H) 4.0 - 10.5 K/uL   RBC 4.27 3.87 - 5.11 MIL/uL   Hemoglobin 13.3 12.0 - 15.0 g/dL   HCT 40.9 36.0 - 46.0 %   MCV 95.8 80.0 - 100.0 fL   MCH 31.1 26.0 - 34.0 pg   MCHC 32.5 30.0 - 36.0 g/dL   RDW 12.7 11.5 - 15.5 %   Platelets 212 150 - 400 K/uL   nRBC 0.0 0.0 - 0.2 %    Comment: Performed at [facility], [place] [address], [place], [place] [address]    Dg Chest 1 View  Result Date: 05/01/2019 CLINICAL DATA:  85 year female with left hip fracture. Preop evaluation. EXAM: CHEST  1 VIEW COMPARISON:  Chest radiograph dated 12/17/2017 FINDINGS: No focal consolidation, pleural effusion, pneumothorax. Borderline cardiomegaly. Calcification of the mitral annulus as well as atherosclerotic calcification of the aorta. No acute osseous pathology. Degenerative changes of spine. IMPRESSION: No active disease. Electronically Signed   By: Anner Crete M.D.   On: 05/01/2019 22:41   Dg Pelvis 1-2 Views  Result Date: 05/01/2019 CLINICAL DATA:  85 year old female with fall and left lower extremity pain. EXAM: PELVIS - 1-2 VIEW; LEFT FEMUR 2 VIEWS COMPARISON:  CT of the abdomen pelvis dated 02/08/2019 FINDINGS: Evaluation is limited due to positioning of the hips. There is a nondisplaced  fracture of the left femoral neck. There is no dislocation. The bones are osteopenic. Degenerative changes of the hips and SI joints. IMPRESSION: Nondisplaced fracture of the left femoral neck. Electronically Signed   By: Anner Crete M.D.   On: 05/01/2019 22:38   Ct Head Wo Contrast  Result Date: 05/01/2019 CLINICAL DATA:  Fall EXAM: CT HEAD WITHOUT CONTRAST CT CERVICAL SPINE WITHOUT CONTRAST TECHNIQUE: Multidetector CT imaging of the head and cervical spine was performed following the standard protocol without intravenous contrast. Multiplanar CT image reconstructions of the cervical spine were also generated. COMPARISON:  None. FINDINGS: CT HEAD FINDINGS Brain: There is no mass, hemorrhage or extra-axial collection. There is generalized atrophy without lobar predilection. There is hypoattenuation of the periventricular white matter, most commonly indicating chronic ischemic microangiopathy. Vascular: No abnormal hyperdensity of the major intracranial arteries or dural venous sinuses. No intracranial atherosclerosis. Skull: The visualized skull base, calvarium and extracranial soft tissues are normal. Sinuses/Orbits: There are secretions within both maxillary sinuses. The right frontal sinus is opacified. Partial opacification of the ethmoid air cells. No mastoid or middle ear effusion. The orbits are normal. CT CERVICAL SPINE FINDINGS Alignment: There is grade 1 anterolisthesis at C5-6, likely due to facet arthrosis. Skull base and vertebrae: There is no fracture. Soft tissues and spinal canal: No prevertebral fluid  or swelling. No visible canal hematoma. Disc levels: There is severe multilevel facet arthrosis. The right facets are fused at C2-C4. Disc degeneration is greatest at C5-7. No bony spinal canal stenosis. Upper chest: Biapical emphysema. Other: Normal visualized paraspinal cervical soft tissues. IMPRESSION: 1. Chronic ischemic microangiopathy and generalized atrophy without acute intracranial  abnormality. 2. No acute fracture of the cervical spine. Electronically Signed   By: Ulyses Jarred M.D.   On: 05/01/2019 23:37   Ct Cervical Spine Wo Contrast  Result Date: 05/01/2019 CLINICAL DATA:  Fall EXAM: CT HEAD WITHOUT CONTRAST CT CERVICAL SPINE WITHOUT CONTRAST TECHNIQUE: Multidetector CT imaging of the head and cervical spine was performed following the standard protocol without intravenous contrast. Multiplanar CT image reconstructions of the cervical spine were also generated. COMPARISON:  None. FINDINGS: CT HEAD FINDINGS Brain: There is no mass, hemorrhage or extra-axial collection. There is generalized atrophy without lobar predilection. There is hypoattenuation of the periventricular white matter, most commonly indicating chronic ischemic microangiopathy. Vascular: No abnormal hyperdensity of the major intracranial arteries or dural venous sinuses. No intracranial atherosclerosis. Skull: The visualized skull base, calvarium and extracranial soft tissues are normal. Sinuses/Orbits: There are secretions within both maxillary sinuses. The right frontal sinus is opacified. Partial opacification of the ethmoid air cells. No mastoid or middle ear effusion. The orbits are normal. CT CERVICAL SPINE FINDINGS Alignment: There is grade 1 anterolisthesis at C5-6, likely due to facet arthrosis. Skull base and vertebrae: There is no fracture. Soft tissues and spinal canal: No prevertebral fluid or swelling. No visible canal hematoma. Disc levels: There is severe multilevel facet arthrosis. The right facets are fused at C2-C4. Disc degeneration is greatest at C5-7. No bony spinal canal stenosis. Upper chest: Biapical emphysema. Other: Normal visualized paraspinal cervical soft tissues. IMPRESSION: 1. Chronic ischemic microangiopathy and generalized atrophy without acute intracranial abnormality. 2. No acute fracture of the cervical spine. Electronically Signed   By: Ulyses Jarred M.D.   On: 05/01/2019 23:37    Dg Femur Min 2 Views Left  Result Date: 05/01/2019 CLINICAL DATA:  85 year old female with fall and left lower extremity pain. EXAM: PELVIS - 1-2 VIEW; LEFT FEMUR 2 VIEWS COMPARISON:  CT of the abdomen pelvis dated 02/08/2019 FINDINGS: Evaluation is limited due to positioning of the hips. There is a nondisplaced fracture of the left femoral neck. There is no dislocation. The bones are osteopenic. Degenerative changes of the hips and SI joints. IMPRESSION: Nondisplaced fracture of the left femoral neck. Electronically Signed   By: Anner Crete M.D.   On: 05/01/2019 22:38    ROS positive for history of coronary disease, atrial fib in the past currently in sinus rhythm, dyslipidemia, hypertension, moderate aortic stenosis.  History of GERD low back pain.  History of waking up slowly from general anesthesia. Blood pressure 137/79, pulse 66, temperature 98.5 F (36.9 C), temperature source Oral, resp. rate 18, height 5\' 2"  (1.575 m), weight 50.9 kg, SpO2 92 %. Physical Exam  Constitutional: She is oriented to person, place, and time. She appears well-developed and well-nourished.  HENT:  Head: Normocephalic and atraumatic.  Eyes: Pupils are equal, round, and reactive to light.  Glasses  Neck: Normal range of motion. No tracheal deviation present. No thyromegaly present.  Cardiovascular: Normal rate and regular rhythm.  Respiratory: Effort normal. No respiratory distress. She has no wheezes.  GI: Soft.  Musculoskeletal:     Comments: Pain with left hip range of motion short left lower extremity.  Neurological: She is alert  and oriented to person, place, and time.  Skin: Skin is warm and dry.  Psychiatric: She has a normal mood and affect. Her behavior is normal. Judgment and thought content normal.    Assessment/Plan: Son at the bedside.  I discussed with patient and son outlined plan of care for left hemiarthroplasty for displaced angulated left femoral neck fracture.  Risks of surgery  discussed including risk of anesthesia.  With her aortic stenosis spinal may be a better choice we will leave this up to the anesthesia team.  We discussed postoperative rehab ambulation with the walker.  [facility] [address]  Questions were elicited and answered they understand request to proceed.  [name] 05/02/2019, 11:32 AM

## 2019-05-02 NOTE — Progress Notes (Signed)
Notified provider on call of pts pain level and morphine not helping at all.

## 2019-05-02 NOTE — Op Note (Signed)
Preop diagnosis: Left displaced angulated femoral neck fracture.  Postop diagnosis: Same  Procedure: Left hip monopolar hemiarthroplasty for femoral neck fracture.  Surgeon: Rodell Perna, MD  Anesthesia: General +10 cc Marcaine skin local EBL: 100 cc  Implants:DepuyMonopolar hemiarthroplasty #3 [place] basic stem +0 neck length 46 mm 77mm monopolar ball.  +0 neck length  Procedure: After induction general anesthesia orotracheal ovation patient placed lateral with the Mark 7 frame axillary roll left hip up 1015 drape was applied Ancef prophylaxis timeout procedure while the ChloraPrep was drying.  Standard total hip  sheets drapes impervious stockinette and Coban was applied.  Sterile skin marker Betadine Steri-Drape x2 ceiling the skin.  Posterior approach was made gluteus maximus split in line with the fibers Charnley tractor placed protecting the sciatic nerve.  Piriformis was tagged cut posterior capsule opened head was removed with a corkscrew neck cut 1 fingerbreadth above the lesser trochanter.  Sequential preparation of the canal progressing up to #3 size which gave a tight fit with excellent rotational stability and good fit and fill of the canal.  No bone was left in the socket.  Head was sized 46 gave good suction fit.  Stem was inserted +0 neck monopolar head.  Irrigation and reduction of the hip with a bone clamp there was flexion to 85 degrees internal rotation 90 degrees without subluxation.  Leg lengths are equal palpation of the knee.  Piriformis was repaired to gluteus medius with empty needle using the previously placed #1 Ethibond sutures.  #1 Vicryl in the gluteus maximus fascia 2-0 Vicryl subtenons tissue skin staple closure 10 cc Marcaine infiltration and postop dressing followed by transfer to the recovery room in stable condition patient tolerated procedure well.

## 2019-05-02 NOTE — H&P (View-Only) (Signed)
Reason for Consult:left femoral neck displaced fracture Referring Physician: Marlowe Sax MD  [name] is an 85 y.o. female.  HPI: 85 year old female in independent living at friend[facility] home last had a mechanical fall presented to emergency room with a displaced angulated left femoral neck fracture.  Past history of dyslipidemia hypertension atrial fib not on anticoagulation currently but on it in the past.  Patient just had an echo and has some moderate aortic stenosis with satisfactory ejection fraction.  Patient denies loss of consciousness.  Had severe pain in her hip after the fall unable to ambulate.  Past Medical History:  Diagnosis Date   Abnormality of gait 09/07/2015   Anemia, iron deficiency 09/16/2014   Anxiety    Arthritis    "hands and feet" (12/06/2017)   CAD (coronary artery disease)    a. 08/2014 NSTEMI/Cath: mild Ca2+ or RCA ostium, otw nl cors. CO 3.3 L/min (thermo), 3.7 L/min (Fick).   Carotid arterial disease ([place]) 07/30/2012   carotid doppler; normal study   CHF (congestive heart failure) (HCC)    Chronic anticoagulation, with coumadin, with PAF and CHADS2Vasc2 score of 4 09/16/2014   Chronic back pain    "all over" (0000000)   Complication of anesthesia    "they had hard time waking me up" (12/06/2017)   Degenerative arthritis    Diverticulosis    Essential hypertension    GERD (gastroesophageal reflux disease)    History of blood transfusion 12/06/2017   History of hiatal hernia    History of nuclear stress test 09/19/2007   normal pattern of perfusion; post-stress EF 86%; EKG negative for ischemia; low risk scan   Hyperlipidemia    Left carotid bruit    a. 07/2015 Carotid U/S: 1-39% bilat ICA stenosis.   Memory disorder 03/05/2014   Mild renal insufficiency    Mitral prolapse    Neuropathy    "hands and feet" (12/06/2017)   PAF (paroxysmal atrial fibrillation) ([place])    a. 08/2014-->Coumadin (CHA2DS2VASc = 4).   Peripheral  neuropathy    Small fiber    Pre-syncope    a. 04/2015 in setting of bradycardia-->CCB/BB doses adjusted.   Pulmonary hypertension (HCC)    Seasonal allergies    Skin cancer    "burned off my face" (12/06/2017)   Valvular heart disease    a. 04/2015 Echo: EF 65-70%, no rwma, Gr1 DD, mild AS, triv AI, mild MS, mod MR, mod dil LA, mod TR, sev increased PASP.    Past Surgical History:  Procedure Laterality Date   ABDOMINAL HYSTERECTOMY     APPENDECTOMY     BUNIONECTOMY Bilateral    CARPAL TUNNEL RELEASE Left    CHOLECYSTECTOMY OPEN     DILATION AND CURETTAGE OF UTERUS     ESOPHAGOGASTRODUODENOSCOPY N/A 12/10/2017   Procedure: ESOPHAGOGASTRODUODENOSCOPY (EGD);  Surgeon: Clarene Essex, MD;  Location: [facility];  Service: Endoscopy;  Laterality: N/A;   FERTILITY SURGERY     resuspension procedure    FOOT FRACTURE SURGERY Left    FRACTURE SURGERY     JOINT REPLACEMENT     LEFT HEART CATHETERIZATION WITH CORONARY ANGIOGRAM N/A 09/14/2014   Procedure: LEFT HEART CATHETERIZATION WITH CORONARY ANGIOGRAM;  Surgeon: Troy Sine, MD;  Location: [facility] CATH LAB;  Service: Cardiovascular;  Laterality: N/A;   REPLACEMENT TOTAL KNEE Right 03/2008   Archie Endo 12/19/2010   ROBOTIC ASSISTED BILATERAL SALPINGO OOPHERECTOMY Bilateral 02/19/2017   Procedure: XI ROBOTIC ASSISTED BILATERAL SALPINGO OOPHORECTOMY AND LYSIS OF ADHESION;  Surgeon: Denman George,  Terrence Dupont, MD;  Location: [facility] ORS;  Service: Gynecology;  Laterality: Bilateral;   TEAR DUCT PROBING Left    TONSILLECTOMY     UMBILICAL HERNIA REPAIR      Family History  Problem Relation Age of Onset   Pneumonia Mother    Coronary artery disease Mother    Hypertension Mother    Heart disease Father    Cancer Father    Hypertension Father    Arthritis Sister     Social History:  reports that she has quit smoking. Her smoking use included cigarettes. She has never used smokeless tobacco. She reports that she does not drink alcohol or  use drugs.  Allergies:  Allergies  Allergen Reactions   Avelox [Moxifloxacin Hcl In Nacl] Shortness Of Breath and Swelling   Moxifloxacin Other (See Comments), Shortness Of Breath and Swelling    other   Aricept [Donepezil Hcl] Diarrhea   Codeine Swelling        Donepezil Diarrhea   Garlic Diarrhea   Latex Hives, Itching, Rash and Other (See Comments)    Watery blisters    Onion Diarrhea   Sulfa Drugs Cross Reactors Swelling   Duloxetine Rash   Polysporin [Bacitracin-Polymyxin B] Other (See Comments)    other    Medications: I have reviewed the patient's current medications.  Results for orders placed or performed during the hospital encounter of 05/01/19 (from the past 48 hour(s))  SARS Coronavirus 2 [facility] order, Performed in [facility] hospital lab) Nasopharyngeal Nasopharyngeal Swab     Status: None   Collection Time: 05/01/19 10:47 PM   Specimen: Nasopharyngeal Swab  Result Value Ref Range   SARS Coronavirus 2 NEGATIVE NEGATIVE    Comment: (NOTE) If result is NEGATIVE SARS-CoV-2 target nucleic acids are NOT DETECTED. The SARS-CoV-2 RNA is generally detectable in upper and lower  respiratory specimens during the acute phase of infection. The lowest  concentration of SARS-CoV-2 viral copies this assay can detect is 250  copies / mL. A negative result does not preclude SARS-CoV-2 infection  and should not be used as the sole basis for treatment or other  patient management decisions.  A negative result may occur with  improper specimen collection / handling, submission of specimen other  than nasopharyngeal swab, presence of viral mutation(s) within the  areas targeted by this assay, and inadequate number of viral copies  (<250 copies / mL). A negative result must be combined with clinical  observations, patient history, and epidemiological information. If result is POSITIVE SARS-CoV-2 target nucleic acids are DETECTED. The SARS-CoV-2 RNA is generally  detectable in upper and lower  respiratory specimens dur ing the acute phase of infection.  Positive  results are indicative of active infection with SARS-CoV-2.  Clinical  correlation with patient history and other diagnostic information is  necessary to determine patient infection status.  Positive results do  not rule out bacterial infection or co-infection with other viruses. If result is PRESUMPTIVE POSTIVE SARS-CoV-2 nucleic acids MAY BE PRESENT.   A presumptive positive result was obtained on the submitted specimen  and confirmed on repeat testing.  While 2019 novel coronavirus  (SARS-CoV-2) nucleic acids may be present in the submitted sample  additional confirmatory testing may be necessary for epidemiological  and / or clinical management purposes  to differentiate between  SARS-CoV-2 and other Sarbecovirus currently known to infect humans.  If clinically indicated additional testing with an alternate test  methodology [id number]) is advised. The SARS-CoV-2 RNA is generally  detectable  in upper and lower respiratory sp ecimens during the acute  phase of infection. The expected result is Negative. Fact Sheet for Patients:  StrictlyIdeas.no Fact Sheet for Healthcare Providers: BankingDealers.co.za This test is not yet approved or cleared by the [place] FDA and has been authorized for detection and/or diagnosis of SARS-CoV-2 by FDA under an Emergency Use Authorization (EUA).  This EUA will remain in effect (meaning this test can be used) for the duration of the COVID-19 declaration under Section 564(b)(1) of the Act, 21 U.S.C. section 360bbb-3(b)(1), unless the authorization is terminated or revoked sooner. Performed at [facility], [place] [address], [place], [place] [address]   Sample to Blood Bank     Status: None   Collection Time: 05/01/19 10:50 PM  Result Value Ref Range   Blood Bank Specimen SAMPLE  AVAILABLE FOR TESTING    Sample Expiration      05/04/2019,2359 Performed at [facility], [place] [address], [place], [place] [id number]   Basic metabolic panel     Status: Abnormal   Collection Time: 05/01/19 11:24 PM  Result Value Ref Range   Sodium 139 135 - 145 mmol/L   Potassium 5.3 (H) 3.5 - 5.1 mmol/L   Chloride 102 98 - 111 mmol/L   CO2 26 22 - 32 mmol/L   Glucose, Bld 112 (H) 70 - 99 mg/dL   BUN 19 8 - 23 mg/dL   Creatinine, Ser 0.74 0.44 - 1.00 mg/dL   Calcium 9.2 8.9 - 10.3 mg/dL   GFR calc non Af Amer >60 >60 mL/min   GFR calc Af Amer >60 >60 mL/min   Anion gap 11 5 - 15    Comment: Performed at [facility], [place] [address], [place], [place] [address]  CBC     Status: Abnormal   Collection Time: 05/01/19 11:24 PM  Result Value Ref Range   WBC 11.3 (H) 4.0 - 10.5 K/uL   RBC 4.08 3.87 - 5.11 MIL/uL   Hemoglobin 12.7 12.0 - 15.0 g/dL   HCT 39.0 36.0 - 46.0 %   MCV 95.6 80.0 - 100.0 fL   MCH 31.1 26.0 - 34.0 pg   MCHC 32.6 30.0 - 36.0 g/dL   RDW 12.7 11.5 - 15.5 %   Platelets 215 150 - 400 K/uL   nRBC 0.0 0.0 - 0.2 %    Comment: Performed at [facility], [place] [address] [place], [place] [address]  Surgical PCR screen     Status: None   Collection Time: 05/02/19  2:53 AM   Specimen: Nasal Mucosa; Nasal Swab  Result Value Ref Range   MRSA, PCR NEGATIVE NEGATIVE   Staphylococcus aureus NEGATIVE NEGATIVE    Comment: (NOTE) The Xpert SA Assay (FDA approved for NASAL specimens in patients 3 years of age and older), is one component of a comprehensive surveillance program. It is not intended to diagnose infection nor to guide or monitor treatment. Performed at [facility], [place] [address] [place], [place] [address]   Comprehensive metabolic panel     Status: Abnormal   Collection Time: 05/02/19  3:38 AM  Result Value Ref Range   Sodium 140 135 - 145 mmol/L   Potassium 4.4 3.5 - 5.1 mmol/L      Comment: DELTA CHECK NOTED   Chloride 104 98 - 111 mmol/L   CO2 28 22 - 32 mmol/L   Glucose, Bld 139 (H) 70 - 99 mg/dL   BUN  19 8 - 23 mg/dL   Creatinine, Ser 0.66 0.44 - 1.00 mg/dL   Calcium 9.7 8.9 - 10.3 mg/dL   Total Protein 7.9 6.5 - 8.1 g/dL   Albumin 4.2 3.5 - 5.0 g/dL   AST 25 15 - 41 U/L   ALT 18 0 - 44 U/L   Alkaline Phosphatase 59 38 - 126 U/L   Total Bilirubin 0.8 0.3 - 1.2 mg/dL   GFR calc non Af Amer >60 >60 mL/min   GFR calc Af Amer >60 >60 mL/min   Anion gap 8 5 - 15    Comment: Performed at [facility], [place] [address] [place], [place] [address]  CBC     Status: Abnormal   Collection Time: 05/02/19  3:38 AM  Result Value Ref Range   WBC 13.9 (H) 4.0 - 10.5 K/uL   RBC 4.27 3.87 - 5.11 MIL/uL   Hemoglobin 13.3 12.0 - 15.0 g/dL   HCT 40.9 36.0 - 46.0 %   MCV 95.8 80.0 - 100.0 fL   MCH 31.1 26.0 - 34.0 pg   MCHC 32.5 30.0 - 36.0 g/dL   RDW 12.7 11.5 - 15.5 %   Platelets 212 150 - 400 K/uL   nRBC 0.0 0.0 - 0.2 %    Comment: Performed at [facility], [place] [address] [place], [place] [address]    Dg Chest 1 View  Result Date: 05/01/2019 CLINICAL DATA:  85 year female with left hip fracture. Preop evaluation. EXAM: CHEST  1 VIEW COMPARISON:  Chest radiograph dated 12/17/2017 FINDINGS: No focal consolidation, pleural effusion, pneumothorax. Borderline cardiomegaly. Calcification of the mitral annulus as well as atherosclerotic calcification of the aorta. No acute osseous pathology. Degenerative changes of spine. IMPRESSION: No active disease. Electronically Signed   By: Anner Crete M.D.   On: 05/01/2019 22:41   Dg Pelvis 1-2 Views  Result Date: 05/01/2019 CLINICAL DATA:  85 year old female with fall and left lower extremity pain. EXAM: PELVIS - 1-2 VIEW; LEFT FEMUR 2 VIEWS COMPARISON:  CT of the abdomen pelvis dated 02/08/2019 FINDINGS: Evaluation is limited due to positioning of the hips. There is a nondisplaced  fracture of the left femoral neck. There is no dislocation. The bones are osteopenic. Degenerative changes of the hips and SI joints. IMPRESSION: Nondisplaced fracture of the left femoral neck. Electronically Signed   By: Anner Crete M.D.   On: 05/01/2019 22:38   Ct Head Wo Contrast  Result Date: 05/01/2019 CLINICAL DATA:  Fall EXAM: CT HEAD WITHOUT CONTRAST CT CERVICAL SPINE WITHOUT CONTRAST TECHNIQUE: Multidetector CT imaging of the head and cervical spine was performed following the standard protocol without intravenous contrast. Multiplanar CT image reconstructions of the cervical spine were also generated. COMPARISON:  None. FINDINGS: CT HEAD FINDINGS Brain: There is no mass, hemorrhage or extra-axial collection. There is generalized atrophy without lobar predilection. There is hypoattenuation of the periventricular white matter, most commonly indicating chronic ischemic microangiopathy. Vascular: No abnormal hyperdensity of the major intracranial arteries or dural venous sinuses. No intracranial atherosclerosis. Skull: The visualized skull base, calvarium and extracranial soft tissues are normal. Sinuses/Orbits: There are secretions within both maxillary sinuses. The right frontal sinus is opacified. Partial opacification of the ethmoid air cells. No mastoid or middle ear effusion. The orbits are normal. CT CERVICAL SPINE FINDINGS Alignment: There is grade 1 anterolisthesis at C5-6, likely due to facet arthrosis. Skull base and vertebrae: There is no fracture. Soft tissues and spinal canal: No prevertebral fluid  or swelling. No visible canal hematoma. Disc levels: There is severe multilevel facet arthrosis. The right facets are fused at C2-C4. Disc degeneration is greatest at C5-7. No bony spinal canal stenosis. Upper chest: Biapical emphysema. Other: Normal visualized paraspinal cervical soft tissues. IMPRESSION: 1. Chronic ischemic microangiopathy and generalized atrophy without acute intracranial  abnormality. 2. No acute fracture of the cervical spine. Electronically Signed   By: Ulyses Jarred M.D.   On: 05/01/2019 23:37   Ct Cervical Spine Wo Contrast  Result Date: 05/01/2019 CLINICAL DATA:  Fall EXAM: CT HEAD WITHOUT CONTRAST CT CERVICAL SPINE WITHOUT CONTRAST TECHNIQUE: Multidetector CT imaging of the head and cervical spine was performed following the standard protocol without intravenous contrast. Multiplanar CT image reconstructions of the cervical spine were also generated. COMPARISON:  None. FINDINGS: CT HEAD FINDINGS Brain: There is no mass, hemorrhage or extra-axial collection. There is generalized atrophy without lobar predilection. There is hypoattenuation of the periventricular white matter, most commonly indicating chronic ischemic microangiopathy. Vascular: No abnormal hyperdensity of the major intracranial arteries or dural venous sinuses. No intracranial atherosclerosis. Skull: The visualized skull base, calvarium and extracranial soft tissues are normal. Sinuses/Orbits: There are secretions within both maxillary sinuses. The right frontal sinus is opacified. Partial opacification of the ethmoid air cells. No mastoid or middle ear effusion. The orbits are normal. CT CERVICAL SPINE FINDINGS Alignment: There is grade 1 anterolisthesis at C5-6, likely due to facet arthrosis. Skull base and vertebrae: There is no fracture. Soft tissues and spinal canal: No prevertebral fluid or swelling. No visible canal hematoma. Disc levels: There is severe multilevel facet arthrosis. The right facets are fused at C2-C4. Disc degeneration is greatest at C5-7. No bony spinal canal stenosis. Upper chest: Biapical emphysema. Other: Normal visualized paraspinal cervical soft tissues. IMPRESSION: 1. Chronic ischemic microangiopathy and generalized atrophy without acute intracranial abnormality. 2. No acute fracture of the cervical spine. Electronically Signed   By: Ulyses Jarred M.D.   On: 05/01/2019 23:37    Dg Femur Min 2 Views Left  Result Date: 05/01/2019 CLINICAL DATA:  85 year old female with fall and left lower extremity pain. EXAM: PELVIS - 1-2 VIEW; LEFT FEMUR 2 VIEWS COMPARISON:  CT of the abdomen pelvis dated 02/08/2019 FINDINGS: Evaluation is limited due to positioning of the hips. There is a nondisplaced fracture of the left femoral neck. There is no dislocation. The bones are osteopenic. Degenerative changes of the hips and SI joints. IMPRESSION: Nondisplaced fracture of the left femoral neck. Electronically Signed   By: Anner Crete M.D.   On: 05/01/2019 22:38    ROS positive for history of coronary disease, atrial fib in the past currently in sinus rhythm, dyslipidemia, hypertension, moderate aortic stenosis.  History of GERD low back pain.  History of waking up slowly from general anesthesia. Blood pressure 137/79, pulse 66, temperature 98.5 F (36.9 C), temperature source Oral, resp. rate 18, height 5\' 2"  (1.575 m), weight 50.9 kg, SpO2 92 %. Physical Exam  Constitutional: She is oriented to person, place, and time. She appears well-developed and well-nourished.  HENT:  Head: Normocephalic and atraumatic.  Eyes: Pupils are equal, round, and reactive to light.  Glasses  Neck: Normal range of motion. No tracheal deviation present. No thyromegaly present.  Cardiovascular: Normal rate and regular rhythm.  Respiratory: Effort normal. No respiratory distress. She has no wheezes.  GI: Soft.  Musculoskeletal:     Comments: Pain with left hip range of motion short left lower extremity.  Neurological: She is alert  and oriented to person, place, and time.  Skin: Skin is warm and dry.  Psychiatric: She has a normal mood and affect. Her behavior is normal. Judgment and thought content normal.    Assessment/Plan: Son at the bedside.  I discussed with patient and son outlined plan of care for left hemiarthroplasty for displaced angulated left femoral neck fracture.  Risks of surgery  discussed including risk of anesthesia.  With her aortic stenosis spinal may be a better choice we will leave this up to the anesthesia team.  We discussed postoperative rehab ambulation with the walker.  [facility] [address].  Questions were elicited and answered they understand request to proceed.  [name] 05/02/2019, 11:32 AM

## 2019-05-02 NOTE — Interval H&P Note (Signed)
History and Physical Interval Note:  05/02/2019 1:10 PM  [name]  has presented today for surgery, with the diagnosis of left hip fracture.  The various methods of treatment have been discussed with the patient and family. After consideration of risks, benefits and other options for treatment, the patient has consented to  Procedure(s): ARTHROPLASTY BIPOLAR HIP (HEMIARTHROPLASTY) (Left) as a surgical intervention.  The patient's history has been reviewed, patient examined, no change in status, stable for surgery.  I have reviewed the patient's chart and labs.  Questions were answered to the patient's satisfaction.     Marybelle Killings

## 2019-05-02 NOTE — Progress Notes (Addendum)
[place] OF CARE NOTE Patient: [name] [id number]   PCP: Deland Pretty, MD DOB: 08/05/1922   DOA: 05/01/2019   DOS: 05/02/2019    Patient was admitted by my colleague Dr. Lorin Mercy earlier on 05/02/2019. I have reviewed the H&P as well as assessment and plan and agree with the same. Important changes in the plan are listed below.  Plan of care: Principal Problem:   Closed left femoral fracture (HCC) Active Problems:   Essential hypertension   Mixed hyperlipidemia   Pulmonary hypertension (HCC)   Memory disorder   PAF (paroxysmal atrial fibrillation) (HCC)   CAD (coronary artery disease)   Atrial fibrillation with RVR (HCC)   Gastroesophageal reflux disease without esophagitis   Preoperative medical evaluation No Coronary revascularization/CVA within 5 years. No Recent stress test. Can Climb flight of stair, participates in recreational activity,does household chores. No Prior adverse event with anesthesia. No Alcohol use, drug use.  A) Cardiac risk: Based on RCRI   With this the patient is a moderate/intermittent risk for adverse Cardiac outcome from surgery. Echocardiogram shows moderate aortic stenosis, family understands the risk of the surgery. Discussed with cardiology.  No prohibitive risk for surgery. Be watchful of hydration since the pt has history of CHF. Monitor Ins and Out.  B) Pulmonary risk: Recommend optimization of lung function with use of incentive spirometry and good pulmunary toilet.  C) Bleeding risk Will request Surgeon to please Order Lovenox/DVT prophylaxis of his/her choice when OK from Surgeon's standpoint post op.  Advance goals of care discussion. Had a prolonged discussion with patient son and patient. Explained the complications that can arise from moderate to severe aortic stenosis. Discussed outcomes in the event of cardiac arrest Patient currently would like to transition to DNR. Patient understands that  perioperatively this DNR will be rescinded. Monitor. Patient would like to treat for treatable condition. Patient was initially partial code with no intubation.  Orders changed and epic to reflect DNR.  Time: 45 minutes.  Author: Berle Mull, MD [facility] 05/02/2019 1:43 PM   If 7PM-7AM, please contact night-coverage at [email or web address]

## 2019-05-02 NOTE — Plan of Care (Signed)
  Problem: Activity: Goal: Risk for activity intolerance will decrease Outcome: Progressing   Problem: Nutrition: Goal: Adequate nutrition will be maintained Outcome: Progressing   

## 2019-05-02 NOTE — Anesthesia Procedure Notes (Signed)
Procedure Name: Intubation Date/Time: 05/02/2019 1:46 PM Performed by: Lissa Morales, CRNA Pre-anesthesia Checklist: Patient identified, Emergency Drugs available, Suction available and Patient being monitored Patient Re-evaluated:Patient Re-evaluated prior to induction Oxygen Delivery Method: Circle system utilized Preoxygenation: Pre-oxygenation with 100% oxygen Induction Type: IV induction Laryngoscope Size: Mac and 4 Grade View: Grade II Tube type: Oral Tube size: 7.0 mm Number of attempts: 1 Airway Equipment and Method: Stylet and Oral airway Placement Confirmation: ETT inserted through vocal cords under direct vision,  positive ETCO2 and breath sounds checked- equal and bilateral Secured at: 21.5 cm Tube secured with: Tape Dental Injury: Teeth and Oropharynx as per pre-operative assessment

## 2019-05-02 NOTE — Anesthesia Postprocedure Evaluation (Signed)
Anesthesia Post Note  Patient: [name]  Procedure(s) Performed: ARTHROPLASTY BIPOLAR HIP (HEMIARTHROPLASTY) (Left Hip)     Patient location during evaluation: PACU Anesthesia Type: General Level of consciousness: awake and alert Pain management: pain level controlled Vital Signs Assessment: post-procedure vital signs reviewed and stable Respiratory status: spontaneous breathing, nonlabored ventilation, respiratory function stable and patient connected to nasal cannula oxygen Cardiovascular status: blood pressure returned to baseline and stable Postop Assessment: no apparent nausea or vomiting Anesthetic complications: no    Last Vitals:  Vitals:   05/02/19 1545 05/02/19 1604  BP: (!) 95/54 101/61  Pulse: 68 70  Resp: 14 14  Temp:  (!) 36.4 C  SpO2: 92% 94%    Last Pain:  Vitals:   05/02/19 1604  TempSrc: Oral  PainSc:                  Tiajuana Amass
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
## 2019-05-02 NOTE — Anesthesia Preprocedure Evaluation (Addendum)
Anesthesia Evaluation  Patient identified by MRN, date of birth, ID band Patient awake    Reviewed: Allergy & Precautions, NPO status , Patient's Chart, lab work & pertinent test results  Airway Mallampati: II  TM Distance: >3 FB     Dental  (+) Dental Advisory Given   Pulmonary former smoker,    breath sounds clear to auscultation       Cardiovascular hypertension, Pt. on medications + CAD, + Past MI and +CHF  + Valvular Problems/Murmurs MR and AS  Rhythm:Regular Rate:Normal + Systolic murmurs Mod AS. Mod MR, Mod TR. Severe pulm HTN.   Neuro/Psych  Neuromuscular disease    GI/Hepatic Neg liver ROS, hiatal hernia, GERD  ,  Endo/Other  negative endocrine ROS  Renal/GU Renal disease     Musculoskeletal  (+) Arthritis ,   Abdominal   Peds  Hematology negative hematology ROS (+)   Anesthesia Other Findings   Reproductive/Obstetrics                            Lab Results  Component Value Date   WBC 13.9 (H) 05/02/2019   HGB 13.3 05/02/2019   HCT 40.9 05/02/2019   MCV 95.8 05/02/2019   PLT 212 05/02/2019   Lab Results  Component Value Date   CREATININE 0.66 05/02/2019   BUN 19 05/02/2019   NA 140 05/02/2019   K 4.4 05/02/2019   CL 104 05/02/2019   CO2 28 05/02/2019    Anesthesia Physical Anesthesia Plan  ASA: III  Anesthesia Plan: General   Post-op Pain Management:    Induction: Intravenous  PONV Risk Score and Plan: 3 and Dexamethasone, Ondansetron and Treatment may vary due to age or medical condition  Airway Management Planned: Oral ETT  Additional Equipment:   Intra-op Plan:   Post-operative Plan: Extubation in OR  Informed Consent: I have reviewed the patients History and Physical, chart, labs and discussed the procedure including the risks, benefits and alternatives for the proposed anesthesia with the patient or authorized representative who has indicated  his/her understanding and acceptance.     Dental advisory given  Plan Discussed with: CRNA  Anesthesia Plan Comments:        Anesthesia Quick Evaluation

## 2019-05-03 LAB — CBC
HCT: 28.8 % — ABNORMAL LOW (ref 36.0–46.0)
Hemoglobin: 9.4 g/dL — ABNORMAL LOW (ref 12.0–15.0)
MCH: 31.4 pg (ref 26.0–34.0)
MCHC: 32.6 g/dL (ref 30.0–36.0)
MCV: 96.3 fL (ref 80.0–100.0)
Platelets: 151 10*3/uL (ref 150–400)
RBC: 2.99 MIL/uL — ABNORMAL LOW (ref 3.87–5.11)
RDW: 12.6 % (ref 11.5–15.5)
WBC: 8.6 10*3/uL (ref 4.0–10.5)
nRBC: 0 % (ref 0.0–0.2)

## 2019-05-03 LAB — BASIC METABOLIC PANEL
Anion gap: 10 (ref 5–15)
BUN: 19 mg/dL (ref 8–23)
CO2: 24 mmol/L (ref 22–32)
Calcium: 8.5 mg/dL — ABNORMAL LOW (ref 8.9–10.3)
Chloride: 100 mmol/L (ref 98–111)
Creatinine, Ser: 0.67 mg/dL (ref 0.44–1.00)
GFR calc Af Amer: >60 mL/min (ref >60)
GFR calc non Af Amer: >60 mL/min (ref >60)
Glucose, Bld: 132 mg/dL — ABNORMAL HIGH (ref 70–99)
Potassium: 4.4 mmol/L (ref 3.5–5.1)
Sodium: 134 mmol/L — ABNORMAL LOW (ref 135–145)

## 2019-05-03 MED ORDER — ADULT MULTIVITAMIN W/MINERALS CH
1.0000 | ORAL_TABLET | Freq: Every day | ORAL | Status: DC
  Administered 2019-05-04 – 2019-05-05 (×2): 1 via ORAL
  Filled 2019-05-03 (×2): qty 1

## 2019-05-03 MED ORDER — DOCUSATE SODIUM 100 MG PO CAPS
100.0000 mg | ORAL_CAPSULE | Freq: Every day | ORAL | Status: DC
  Administered 2019-05-04 – 2019-05-05 (×2): 100 mg via ORAL
  Filled 2019-05-03 (×2): qty 1

## 2019-05-03 NOTE — Evaluation (Signed)
Physical Therapy Evaluation Patient Details Name: SHAWNIKA RISKIN MRN: [id number] DOB: [birth year] Today's Date: 05/03/2019   History of Present Illness  85 yo female admitted with L fem neck fx. S/P hemiarthroplasty 05/02/19. Hx of CAD, CHF, A fib, OA, chronic pain, neuropathy, anemia, memory disorder  Clinical Impression  On eval, pt required Mod assist +2 for mobility. She walked ~15 feet with use of a RW. Pt presents with general weakness, decreased activity tolerance, and impaired gait and balance. Recommend SNF for continued rehab. Will follow during hospital stay.     Follow Up Recommendations SNF    Equipment Recommendations  None recommended by PT    Recommendations for Other Services       Precautions / Restrictions Precautions Precautions: Fall;Posterior Hip Precaution Booklet Issued: Yes (comment) Precaution Comments: Placed posterior precaution handout on table and white board. Reviewed precautions with pt. Restrictions Weight Bearing Restrictions: No LLE Weight Bearing: Weight bearing as tolerated      Mobility  Bed Mobility Overal bed mobility: Needs Assistance Bed Mobility: Supine to Sit     Supine to sit: Mod assist;+2 for safety/equipment;+2 for physical assistance;HOB elevated     General bed mobility comments: Assist for trunk and bil LEs. Cues for safety, adherence to precautions. Increased time. Utilized bedpad to aid with scooting, positioning.  Transfers Overall transfer level: Needs assistance Equipment used: Rolling walker (2 wheeled) Transfers: Sit to/from Stand Sit to Stand: Min assist;+2 physical assistance;+2 safety/equipment;From elevated surface         General transfer comment: Assist to rise, stabilize, control descent. VCs safety, technique, hand/LE placement.  Ambulation/Gait Ambulation/Gait assistance: Min assist;+2 safety/equipment Gait Distance (Feet): 15 Feet Assistive device: Rolling walker (2 wheeled) Gait  Pattern/deviations: Wide base of support;Trunk flexed;Step-to pattern     General Gait Details: Assist to stabilize pt and maneuver RW throughout distance. VCs safety, sequence, distance from RW. Pt was fatigued after short walk  Stairs            Wheelchair Mobility    Modified Rankin (Stroke Patients Only)       Balance Overall balance assessment: Needs assistance;History of Falls         Standing balance support: Bilateral upper extremity supported Standing balance-Leahy Scale: Poor                               Pertinent Vitals/Pain Pain Assessment: Faces Faces Pain Scale: Hurts even more Pain Location: L hip with activity and sitting Pain Descriptors / Indicators: Aching;Discomfort Pain Intervention(s): Limited activity within patient's tolerance;Monitored during session;Repositioned    Home Living Family/patient expects to be discharged to:: Skilled nursing facility Living Arrangements: Alone   Type of Home: Independent living facility Home Access: Level entry     Home Layout: One level Home Equipment: Walker - 2 wheels;Walker - 4 wheels;Hand held shower head;Grab bars - tub/shower;Shower seat;Cane - single point      Prior Function Level of Independence: Independent with assistive device(s)         Comments: uses RW     Hand Dominance        Extremity/Trunk Assessment   Upper Extremity Assessment Upper Extremity Assessment: Defer to OT evaluation    Lower Extremity Assessment Lower Extremity Assessment: Generalized weakness    Cervical / Trunk Assessment Cervical / Trunk Assessment: Kyphotic  Communication   Communication: No difficulties  Cognition Arousal/Alertness: Awake/alert Behavior During Therapy: WFL for tasks assessed/performed Overall Cognitive  Status: History of cognitive impairments - at baseline                                        General Comments      Exercises      Assessment/Plan    PT Assessment Patient needs continued PT services  PT Problem List Decreased strength;Decreased mobility;Decreased safety awareness;Decreased range of motion;Decreased activity tolerance;Decreased balance;Decreased cognition;Pain       PT Treatment Interventions DME instruction;Gait training;Therapeutic exercise;Therapeutic activities;Balance training;Functional mobility training;Patient/family education    PT Goals (Current goals can be found in the Care Plan section)  Acute Rehab PT Goals Patient Stated Goal: less pain. to get better. PT Goal Formulation: With patient Time For Goal Achievement: 05/17/19 Potential to Achieve Goals: Fair    Frequency Min 3X/week   Barriers to discharge        Co-evaluation               AM-PAC PT "6 Clicks" Mobility  Outcome Measure Help needed turning from your back to your side while in a flat bed without using bedrails?: A Lot Help needed moving from lying on your back to sitting on the side of a flat bed without using bedrails?: A Lot Help needed moving to and from a bed to a chair (including a wheelchair)?: A Lot Help needed standing up from a chair using your arms (e.g., wheelchair or bedside chair)?: A Lot Help needed to walk in hospital room?: A Lot Help needed climbing 3-5 steps with a railing? : Total 6 Click Score: 11    End of Session Equipment Utilized During Treatment: Gait belt Activity Tolerance: Patient limited by fatigue;Patient limited by pain Patient left: in chair;with call bell/phone within reach;with chair alarm set   PT Visit Diagnosis: History of falling (Z91.81);Difficulty in walking, not elsewhere classified (R26.2);Muscle weakness (generalized) (M62.81)    Time: [id number] PT Time Calculation (min) (ACUTE ONLY): 15 min   Charges:   PT Evaluation $PT Eval Moderate Complexity: [place], PT Acute Rehabilitation Services Pager: [phone] Office:  [phone]

## 2019-05-03 NOTE — Progress Notes (Signed)
Pt voided 400 mL of clear yellow urine at 1400 this shift. Follow up bladder scan performed at 1650 this shift showing 86 mL present. Will continue to monitor.

## 2019-05-03 NOTE — Progress Notes (Signed)
Initial Nutrition Assessment  RD working remotely.  DOCUMENTATION CODES:   Not applicable  INTERVENTION:  Continue Ensure Enlive po BID, each supplement provides 350 kcal and 20 grams of protein.  Provide daily MVI.  NUTRITION DIAGNOSIS:   Increased nutrient needs related to post-op healing as evidenced by estimated needs.  GOAL:   Patient will meet greater than or equal to 90% of their needs  MONITOR:   PO intake, Supplement acceptance, Labs, Weight trends, Skin, I & O's  REASON FOR ASSESSMENT:   Malnutrition Screening Tool    ASSESSMENT:   85 year old female with PMHx of HTN, HLD, GERD, anxiety, mitral prolapse, neuropathy, diverticulosis, CAD, paroxysmal A-fib on chronic anticoagulation, iron deficiency anemia, CHF, hx hiatal hernia, arthritis admitted after mechanical fall with left displaced angulated femoral neck fracture s/p left hip monopolar hemiarthroplasty on 9/12.   Attempted to call patient over the phone for nutrition/weight history but she was unable to answer. Diet was advanced to regular following surgery yesterday. Meal completion not yet documented in chart so unsure how patient is eating. Patient with closed incision to hip. Otherwise skin is intact. Per chart patient was 53.5 kg on 02/05/2019. She is now 50.9 kg (112.21 lbs). She has lost 2.6 kg (4.9% body weight) over the past 3 months, which is not significant for time frame.  Medications reviewed and include: amiodarone, Augmentin, Colace 100 mg BID, famotidine, Ensure Enlive BID, gabapentin, Remeron 7.5 mg QHS, 1/2NS at 50 mL/hr.  Labs reviewed: Sodium 134.  NUTRITION - FOCUSED PHYSICAL EXAM:  Unable to complete at this time.  Diet Order:   Diet Order            Diet regular Room service appropriate? Yes; Fluid consistency: Thin  Diet effective now             EDUCATION NEEDS:   No education needs have been identified at this time  Skin:  Skin Assessment: Skin Integrity Issues: Skin  Integrity Issues:: Incisions Incisions: closed incision to hip  Last BM:  05/02/2019 per chart  Height:   Ht Readings from Last 1 Encounters:  05/02/19 5\' 2"  (1.575 m)   Weight:   Wt Readings from Last 1 Encounters:  05/02/19 50.9 kg   Ideal Body Weight:  50 kg  BMI:  Body mass index is 20.52 kg/m.  Estimated Nutritional Needs:   Kcal:  1300-1500  Protein:  65-75 grams  Fluid:  1.3-1.5 L/day  Willey Blade, MS, RD, LDN Office: [phone] Pager: [phone] After Hours/Weekend Pager: [phone]

## 2019-05-03 NOTE — Progress Notes (Signed)
   Subjective: 1 Day Post-Op Procedure(s) (LRB): ARTHROPLASTY BIPOLAR HIP (HEMIARTHROPLASTY) (Left) Patient reports pain as mild.    Objective: Vital signs in last 24 hours: Temp:  [97.4 F (36.3 C)-98.4 F (36.9 C)] 97.7 F (36.5 C) (09/13 0506) Pulse Rate:  [66-72] 71 (09/13 0506) Resp:  [14-18] 18 (09/13 0506) BP: (93-126)/(54-66) 105/64 (09/13 0506) SpO2:  [92 %-100 %] 97 % (09/13 0506)  Intake/Output from previous day: 09/12 0701 - 09/13 0700 In: 2125 [P.O.:120; I.V.:1755; IV Piggyback:250] Out: [id number] [Urine:675; Blood:100] Intake/Output this shift: No intake/output data recorded.  Recent Labs    05/01/19 2324 05/02/19 0338 05/03/19 0429  HGB 12.7 13.3 9.4*   Recent Labs    05/02/19 0338 05/03/19 0429  WBC 13.9* 8.6  RBC 4.27 2.99*  HCT 40.9 28.8*  PLT 212 151   Recent Labs    05/02/19 0338 05/03/19 0429  NA 140 134*  K 4.4 4.4  CL 104 100  CO2 28 24  BUN 19 19  CREATININE 0.66 0.67  GLUCOSE 139* 132*  CALCIUM 9.7 8.5*   No results for input(s): LABPT, INR in the last 72 hours.  Neurologically intact Pelvis Portable  Result Date: 05/02/2019 CLINICAL DATA:  Post left hip arthroplasty. EXAM: PORTABLE PELVIS 1-2 VIEWS COMPARISON:  05/01/2019 FINDINGS: Mild diffuse osteopenia. Evidence of patient's recent left hip arthroplasty which is intact and normally located. Skin staples over the lateral soft tissues of the left hip. Remainder of the exam is unchanged. IMPRESSION: Expected changes post left hip arthroplasty. Electronically Signed   By: Marin Olp M.D.   On: 05/02/2019 16:44    Assessment/Plan: 1 Day Post-Op Procedure(s) (LRB): ARTHROPLASTY BIPOLAR HIP (HEMIARTHROPLASTY) (Left) Up with therapy. Posterior hip precautions. Office followup 2 wks.   Marybelle Killings 05/03/2019, 10:25 AM

## 2019-05-03 NOTE — Progress Notes (Signed)
[facility] Progress Note  Patient: [name] [id number]   PCP: Deland Pretty, MD DOB: [birth year]   DOA: 05/01/2019   DOS: 05/03/2019   Date of Service: the patient was seen and examined on 05/03/2019  Chief Complaint  Patient presents with  . Fall  . Hip Pain  . Leg Pain   Brief hospital course: Pt. with PMH of coronary artery disease, CHF, paroxysmal atrial fibrillation, GERD, hypertension, hyperlipidemia ; presented with complain of mechanical fall, was found to have hip fracture s/p arthroplasty.  Currently further plan is post op recover and therapy recommendation .  Subjective: still has some pain, Occasional cough  no fever, chills, headache, runny nose,  shortness of breath, chest pain, palpitation, nausea, vomiting, diarrhea, constipation, abdominal pain, burning urination, increase urinary frequency, focal neurological deficit.   Assessment and Plan: 1.  Left femoral neck fracture. Closed. Discussed with Dr. Lorin Mercy S/P left hip monopolar hemiarthroplasty on 05/02/2019. Tolerated procedure well. Pain well controlled. PT OT recommended. Outpatient pain management DVT prophylaxis per orthopedics.  2.  Essential hypertension. Coronary disease. Paroxysmal A. fib Moderate aortic stenosis. Chronic diastolic CHF. Echocardiogram was performed as a part of preop evaluation which does not show any evidence of acute worsening of above findings. Currently rate controlled. Blood pressure well controlled as well. We will continue current regimen.  Which includes amiodarone.  Norvasc. Patient is not on anticoagulation secondary to GI bleed.  3.  GERD. GI bleed. H&H stable. Continue PPI.  4.  Dementia. Monitor for delirium postoperatively. Currently no acute events. Continue home regimen.  5.  Chronic neuropathy. Continue gabapentin.  6.  Chronic diarrhea Continue colestipol. Monitor for constipation.  7.  On Keppra Do not see any prior history of  seizure disorder or further work-up. Suspect this is for polyneuropathy. Continue to follow-up with neurology as scheduled.  Diet: Regular diet  DVT Prophylaxis: Subcutaneous Lovenox  Advance goals of care discussion: DNR/DNI after my discussion with patient as well as son.  Family Communication: no family was present at bedside, at the time of interview.   Disposition:  Discharge to be determined .  Consultants: orthopedics  Procedures: Left hip hemiarthroplasty  Scheduled Meds: . acetaminophen  500 mg Oral Q6H  . amiodarone  100 mg Oral Daily  . amoxicillin-clavulanate  1 tablet Oral BID  . atorvastatin  5 mg Oral Daily  . colestipol  1 g Oral TID  . docusate sodium  100 mg Oral BID  . enoxaparin (LOVENOX) injection  40 mg Subcutaneous Q24H  . famotidine  20 mg Oral Daily  . feeding supplement (ENSURE ENLIVE)  237 mL Oral BID BM  . fluticasone  1 spray Each Nare Daily  . gabapentin  300 mg Oral TID WC   And  . gabapentin  900 mg Oral QHS  . levETIRAcetam  250 mg Oral Daily   And  . levETIRAcetam  500 mg Oral QHS  . memantine  10 mg Oral BID  . mirtazapine  7.5 mg Oral QHS  . [START ON 05/04/2019] multivitamin with minerals  1 tablet Oral Daily   Continuous Infusions: . sodium chloride Stopped (05/03/19 1306)   PRN Meds: acetaminophen, HYDROcodone-acetaminophen, HYDROcodone-acetaminophen, menthol-cetylpyridinium **OR** phenol, metoCLOPramide **OR** metoCLOPramide (REGLAN) injection, morphine injection, ondansetron **OR** ondansetron (ZOFRAN) IV Antibiotics: Anti-infectives (From admission, onward)   Start     Dose/Rate Route Frequency Ordered Stop   05/02/19 1200  ceFAZolin (ANCEF) IVPB 2g/100 mL premix  Status:  Discontinued     2 g  200 mL/hr over 30 Minutes Intravenous On call to O.R. 05/02/19 1146 05/02/19 1612   05/02/19 1100  amoxicillin-clavulanate (AUGMENTIN) 875-125 MG per tablet 1 tablet     1 tablet Oral 2 times daily 05/02/19 1046 05/04/19 0959        Objective: Physical Exam: Vitals:   05/02/19 1604 05/02/19 2045 05/03/19 0013 05/03/19 0506  BP: 101/61 (!) 97/57 103/61 105/64  Pulse: 70 69 70 71  Resp: 14 16 16 18   Temp: (!) 97.5 F (36.4 C) (!) 97.4 F (36.3 C) 97.6 F (36.4 C) 97.7 F (36.5 C)  TempSrc: Oral Oral Oral Oral  SpO2: 94% 96% 94% 97%  Weight:      Height:        Intake/Output Summary (Last 24 hours) at 05/03/2019 1334 Last data filed at 05/03/2019 0600 Gross per 24 hour  Intake 2125.03 ml  Output 775 ml  Net 1350.03 ml   Filed Weights   05/01/19 2149 05/02/19 0232  Weight: 52.6 kg 50.9 kg   General: alert and oriented to time, place, and person. Appear in mild distress, affect appropriate Eyes: PERRL, Conjunctiva normal ENT: Oral Mucosa Clear, moist  Neck: no JVD, no Abnormal Mass Or lumps Cardiovascular: S1 and S2 Present, aortic systolic  Murmur, peripheral pulses symmetrical Respiratory: good respiratory effort, Bilateral Air entry equal and Decreased, no signs of accessory muscle use, Clear to Auscultation, no Crackles, no wheezes Abdomen: Bowel Sound present, Soft and no tenderness, no hernia Skin: no rashes  Extremities: no Pedal edema, no calf tenderness Neurologic: without any new focal findings Gait not checked due to patient safety concerns  Data Reviewed: I have personally reviewed and interpreted daily labs, tele strips, imagings as discussed above. I reviewed all nursing notes, pharmacy notes, vitals, pertinent old records I have discussed plan of care as described above with RN and patient/family.  CBC: Recent Labs  Lab 05/01/19 2324 05/02/19 0338 05/03/19 0429  WBC 11.3* 13.9* 8.6  HGB 12.7 13.3 9.4*  HCT 39.0 40.9 28.8*  MCV 95.6 95.8 96.3  PLT 215 212 [id number]   Basic Metabolic Panel: Recent Labs  Lab 05/01/19 2324 05/02/19 0338 05/03/19 0429  NA 139 140 134*  K 5.3* 4.4 4.4  CL 102 104 100  CO2 26 28 24   GLUCOSE 112* 139* 132*  BUN 19 19 19   CREATININE 0.74 0.66 0.67   CALCIUM 9.2 9.7 8.5*    Liver Function Tests: Recent Labs  Lab 05/02/19 0338  AST 25  ALT 18  ALKPHOS 59  BILITOT 0.8  PROT 7.9  ALBUMIN 4.2   No results for input(s): LIPASE, AMYLASE in the last 168 hours. No results for input(s): AMMONIA in the last 168 hours. Coagulation Profile: No results for input(s): INR, PROTIME in the last 168 hours. Cardiac Enzymes: No results for input(s): CKTOTAL, CKMB, CKMBINDEX, TROPONINI in the last 168 hours. BNP (last 3 results) No results for input(s): PROBNP in the last 8760 hours. CBG: No results for input(s): GLUCAP in the last 168 hours. Studies: Pelvis Portable  Result Date: 05/02/2019 CLINICAL DATA:  Post left hip arthroplasty. EXAM: PORTABLE PELVIS 1-2 VIEWS COMPARISON:  05/01/2019 FINDINGS: Mild diffuse osteopenia. Evidence of patient's recent left hip arthroplasty which is intact and normally located. Skin staples over the lateral soft tissues of the left hip. Remainder of the exam is unchanged. IMPRESSION: Expected changes post left hip arthroplasty. Electronically Signed   By: Marin Olp M.D.   On: 05/02/2019 16:44  Time spent: 35 minutes  Author: Berle Mull, MD [facility] 05/03/2019 1:34 PM  To reach On-call, see care teams to locate the attending and reach out to them via [email or web address]. If 7PM-7AM, please contact night-coverage If you still have difficulty reaching the attending provider, please page the [facility] (Director on Call) for [facility] on amion for assistance.

## 2019-05-03 NOTE — Evaluation (Signed)
Occupational Therapy Evaluation Patient Details Name: [name] MRN: [id number] DOB: 1921-11-28 Today's Date: 05/03/2019    History of Present Illness 85 yo female admitted with L fem neck fx. S/P hemiarthroplasty 05/02/19. Hx of CAD, CHF, A fib, OA, chronic pain, neuropathy, anemia, memory disorder   Clinical Impression   Pt admitted with above. She demonstrates the below listed deficits and will benefit from continued OT to maximize safety and independence with BADLs.  Pt is very motivated.  She is currently limited by pain, but requires min - max A for ADLs, and min - mod A for functional mobility.  Pt lives in [place] living at [facility].  She will require SNF level rehab at discharge.       Follow Up Recommendations  SNF    Equipment Recommendations  None recommended by OT    Recommendations for Other Services       Precautions / Restrictions Precautions Precautions: Fall;Posterior Hip Precaution Booklet Issued: Yes (comment) Precaution Comments: Pt able to recall 2/3 posterior THA precautions.  Requires min cues to recall the other 2 precautions  Restrictions Weight Bearing Restrictions: No LLE Weight Bearing: Weight bearing as tolerated      Mobility Bed Mobility Overal bed mobility: Needs Assistance Bed Mobility: Sit to Supine     Supine to sit: Mod assist;+2 for safety/equipment;+2 for physical assistance;HOB elevated Sit to supine: Max assist   General bed mobility comments: assist to lift LEs and guide trunk   Transfers Overall transfer level: Needs assistance Equipment used: Rolling walker (2 wheeled) Transfers: Sit to/from [facility] Sit to Stand: Mod assist Stand pivot transfers: Min assist       General transfer comment: assist to move into standing and to steady     Balance Overall balance assessment: Needs assistance;History of Falls Sitting-balance support: Feet supported Sitting balance-Leahy Scale:  Fair     Standing balance support: Bilateral upper extremity supported Standing balance-Leahy Scale: Poor                             ADL either performed or assessed with clinical judgement   ADL Overall ADL's : Needs assistance/impaired Eating/Feeding: Independent   Grooming: Wash/dry hands;Wash/dry face;Oral care;Set up;Sitting   Upper Body Bathing: Set up;Sitting   Lower Body Bathing: Maximal assistance;Sit to/from stand   Upper Body Dressing : Set up;Sitting   Lower Body Dressing: Total assistance;Sit to/from stand   Toilet Transfer: Moderate assistance;Ambulation;Comfort height toilet;Grab bars;RW Armed forces technical officer Details (indicate cue type and reason): requires mod A to move sit to stand  Toileting- Clothing Manipulation and Hygiene: Maximal assistance;Sit to/from stand       Functional mobility during ADLs: Moderate assistance;Rolling walker       Vision         Perception     Praxis      Pertinent Vitals/Pain Pain Assessment: 0-10 Pain Score: 6  Faces Pain Scale: Hurts even more Pain Location: L hip with activity Pain Descriptors / Indicators: Operative site guarding;Aching Pain Intervention(s): Monitored during session;Limited activity within patient's tolerance;Repositioned;Patient requesting pain meds-RN notified     Hand Dominance Right   Extremity/Trunk Assessment Upper Extremity Assessment Upper Extremity Assessment: Generalized weakness   Lower Extremity Assessment Lower Extremity Assessment: Generalized weakness   Cervical / Trunk Assessment Cervical / Trunk Assessment: Kyphotic   Communication Communication Communication: No difficulties   Cognition Arousal/Alertness: Awake/alert Behavior During Therapy: WFL for tasks assessed/performed Overall Cognitive  Status: History of cognitive impairments - at baseline                                     General Comments       Exercises     Shoulder  Instructions      Home Living Family/patient expects to be discharged to:: Skilled nursing facility Living Arrangements: Alone   Type of Home: Independent living facility Home Access: Level entry     Home Layout: One level               Home Equipment: Walker - 2 wheels;Walker - 4 wheels;Hand held shower head;Grab bars - tub/shower;Shower seat;Cane - single point   Additional Comments: Lives at [facility]       Prior Functioning/Environment Level of Independence: Independent with assistive device(s)        Comments: uses RW        OT Problem List: Decreased strength;Decreased activity tolerance;Impaired balance (sitting and/or standing);Decreased safety awareness;Decreased knowledge of use of DME or AE;Decreased knowledge of precautions;Pain      OT Treatment/Interventions: Self-care/ADL training;DME and/or AE instruction;Therapeutic activities;Patient/family education;Balance training    OT Goals(Current goals can be found in the care plan section) Acute Rehab OT Goals Patient Stated Goal: to eventually go back to her apartment  OT Goal Formulation: With patient Time For Goal Achievement: 05/17/19 Potential to Achieve Goals: Good ADL Goals Pt Will Perform Grooming: with min assist;standing Pt Will Perform Lower Body Bathing: with min assist;with adaptive equipment;sit to/from stand Pt Will Perform Lower Body Dressing: with min assist;sit to/from stand;with adaptive equipment Pt Will Transfer to Toilet: with min assist;ambulating;regular height toilet;bedside commode;grab bars Pt Will Perform Toileting - Clothing Manipulation and hygiene: with min assist;sit to/from stand  OT Frequency: Min 2X/week   Barriers to D/C: Decreased caregiver support          Co-evaluation              AM-PAC OT "6 Clicks" Daily Activity     Outcome Measure Help from another person eating meals?: None Help from another person taking care of  personal grooming?: A Little Help from another person toileting, which includes using toliet, bedpan, or urinal?: A Lot Help from another person bathing (including washing, rinsing, drying)?: A Lot Help from another person to put on and taking off regular upper body clothing?: A Little Help from another person to put on and taking off regular lower body clothing?: Total 6 Click Score: 15   End of Session Equipment Utilized During Treatment: Rolling walker Nurse Communication: Mobility status;Precautions;Patient requests pain meds  Activity Tolerance: Patient limited by pain Patient left: in bed;with call bell/phone within reach;with bed alarm set  OT Visit Diagnosis: Pain Pain - Right/Left: Left Pain - part of body: Hip                Time: [id number] OT Time Calculation (min): 24 min Charges:  OT General Charges $OT Visit: 1 Visit OT Evaluation $OT Eval Moderate Complexity: 1 Mod OT Treatments $Therapeutic Activity: 8-22 mins  Lucille Passy, OTR/L Acute Rehabilitation Services Pager [phone] Office [phone]   Lucille Passy M 05/03/2019, 5:04 PM

## 2019-05-04 ENCOUNTER — Encounter (HOSPITAL_COMMUNITY): Payer: Self-pay | Admitting: Orthopaedic Surgery

## 2019-05-04 LAB — BASIC METABOLIC PANEL
Anion gap: 7 (ref 5–15)
BUN: 19 mg/dL (ref 8–23)
CO2: 29 mmol/L (ref 22–32)
Calcium: 8.5 mg/dL — ABNORMAL LOW (ref 8.9–10.3)
Chloride: 100 mmol/L (ref 98–111)
Creatinine, Ser: 0.66 mg/dL (ref 0.44–1.00)
GFR calc Af Amer: >60 mL/min (ref >60)
GFR calc non Af Amer: >60 mL/min (ref >60)
Glucose, Bld: 93 mg/dL (ref 70–99)
Potassium: 4.6 mmol/L (ref 3.5–5.1)
Sodium: 136 mmol/L (ref 135–145)

## 2019-05-04 LAB — CBC
HCT: 24.6 % — ABNORMAL LOW (ref 36.0–46.0)
Hemoglobin: 8 g/dL — ABNORMAL LOW (ref 12.0–15.0)
MCH: 31.6 pg (ref 26.0–34.0)
MCHC: 32.5 g/dL (ref 30.0–36.0)
MCV: 97.2 fL (ref 80.0–100.0)
Platelets: 132 10*3/uL — ABNORMAL LOW (ref 150–400)
RBC: 2.53 MIL/uL — ABNORMAL LOW (ref 3.87–5.11)
RDW: 12.6 % (ref 11.5–15.5)
WBC: 8.1 10*3/uL (ref 4.0–10.5)
nRBC: 0 % (ref 0.0–0.2)

## 2019-05-04 LAB — SARS CORONAVIRUS 2 (TAT 6-24 HRS): SARS Coronavirus 2: NEGATIVE

## 2019-05-04 NOTE — Progress Notes (Signed)
[facility] Progress Note  Patient: [name] [id number]   PCP: Deland Pretty, MD DOB: [birth year]   DOA: 05/01/2019   DOS: 05/04/2019   Date of Service: the patient was seen and examined on 05/04/2019  Chief Complaint  Patient presents with  . Fall  . Hip Pain  . Leg Pain   Brief hospital course: Pt. with PMH of coronary artery disease, CHF, paroxysmal atrial fibrillation, GERD, hypertension, hyperlipidemia ; presented with complain of mechanical fall, was found to have hip fracture s/p arthroplasty.  Currently further plan is post op recover and therapy recommendation .  Subjective: Pain well controlled.  No nausea no vomiting.  No bowel movement.  Passing gas.  Oral intake adequate.  Assessment and Plan: 1.  Left femoral neck fracture. Closed. Discussed with Dr. Lorin Mercy S/P left hip monopolar hemiarthroplasty on 05/02/2019. Tolerated procedure well. Pain well controlled. PT OT recommended SNF.  Social worker arranged SNF.  COVID test ordered. Outpatient pain management DVT prophylaxis per orthopedics.  2.  Essential hypertension. Coronary disease. Paroxysmal A. fib Moderate aortic stenosis. Chronic diastolic CHF. Echocardiogram was performed as a part of preop evaluation which does not show any evidence of acute worsening of above findings. Blood pressure and heart rate well controlled. We will continue current regimen.  Which includes amiodarone.  Norvasc. Patient is not on anticoagulation secondary to GI bleed.  3.  GERD. History of GI bleed. Continue PPI. Hemoglobin stable. No active bleeding.  4.  Dementia. Monitor for delirium postoperatively. No evidence of delirium so far. Continue home regimen.  5.  Chronic neuropathy. Continue gabapentin.  6.  Chronic diarrhea Continue colestipol. If the patient remains constipated tomorrow we will hold colestipol.  7.  On Keppra Do not see any prior history of seizure disorder or further work-up.  Suspect this is for polyneuropathy. Continue to follow-up with neurology as scheduled.  Diet: Regular diet  DVT Prophylaxis: Subcutaneous Lovenox  Advance goals of care discussion: DNR/DNI after my discussion with patient as well as son.  Family Communication: no family was present at bedside, at the time of interview.   Disposition:  Discharge to SNF likely tomorrow.  Consultants: orthopedics  Procedures: Left hip hemiarthroplasty  Scheduled Meds: . amiodarone  100 mg Oral Daily  . atorvastatin  5 mg Oral Daily  . docusate sodium  100 mg Oral Daily  . enoxaparin (LOVENOX) injection  40 mg Subcutaneous Q24H  . famotidine  20 mg Oral Daily  . feeding supplement (ENSURE ENLIVE)  237 mL Oral BID BM  . fluticasone  1 spray Each Nare Daily  . gabapentin  300 mg Oral TID WC   And  . gabapentin  900 mg Oral QHS  . levETIRAcetam  250 mg Oral Daily   And  . levETIRAcetam  500 mg Oral QHS  . memantine  10 mg Oral BID  . mirtazapine  7.5 mg Oral QHS  . multivitamin with minerals  1 tablet Oral Daily   Continuous Infusions:  PRN Meds: acetaminophen, HYDROcodone-acetaminophen, HYDROcodone-acetaminophen, menthol-cetylpyridinium **OR** phenol, morphine injection, ondansetron **OR** ondansetron (ZOFRAN) IV Antibiotics: Anti-infectives (From admission, onward)   Start     Dose/Rate Route Frequency Ordered Stop   05/02/19 1200  ceFAZolin (ANCEF) IVPB 2g/100 mL premix  Status:  Discontinued     2 g 200 mL/hr over 30 Minutes Intravenous On call to O.R. 05/02/19 1146 05/02/19 1612   05/02/19 1100  amoxicillin-clavulanate (AUGMENTIN) 875-125 MG per tablet 1 tablet     1 tablet  Oral 2 times daily 05/02/19 1046 05/03/19 2230       Objective: Physical Exam: Vitals:   05/03/19 1837 05/03/19 2252 05/04/19 0719 05/04/19 1403  BP:  117/78 131/89 (!) 129/48  Pulse:  68 70 83  Resp:  20 18 20   Temp:  98 F (36.7 C) (!) 97.5 F (36.4 C) 98.2 F (36.8 C)  TempSrc:  Oral Oral Oral  SpO2:  93% 100% 99% 95%  Weight:      Height:        Intake/Output Summary (Last 24 hours) at 05/04/2019 1749 Last data filed at 05/04/2019 1404 Gross per 24 hour  Intake 1080 ml  Output 550 ml  Net 530 ml   Filed Weights   05/01/19 2149 05/02/19 0232  Weight: 52.6 kg 50.9 kg   General: alert and oriented to time, place, and person. Appear in mild distress, affect appropriate Eyes: PERRL, Conjunctiva normal ENT: Oral Mucosa Clear, moist  Neck: no JVD, no Abnormal Mass Or lumps Cardiovascular: S1 and S2 Present, aortic systolic  Murmur, peripheral pulses symmetrical Respiratory: good respiratory effort, Bilateral Air entry equal and Decreased, no signs of accessory muscle use, Clear to Auscultation, no Crackles, no wheezes Abdomen: Bowel Sound present, Soft and no tenderness, no hernia Skin: no rashes  Extremities: no Pedal edema, no calf tenderness Neurologic: without any new focal findings Gait not checked due to patient safety concerns   Data Reviewed: I have personally reviewed and interpreted daily labs, tele strips, imagings as discussed above. I reviewed all nursing notes, pharmacy notes, vitals, pertinent old records I have discussed plan of care as described above with RN and patient/family.  CBC: Recent Labs  Lab 05/01/19 2324 05/02/19 0338 05/03/19 0429 05/04/19 0505  WBC 11.3* 13.9* 8.6 8.1  HGB 12.7 13.3 9.4* 8.0*  HCT 39.0 40.9 28.8* 24.6*  MCV 95.6 95.8 96.3 97.2  PLT 215 212 151 [id number]*   Basic Metabolic Panel: Recent Labs  Lab 05/01/19 2324 05/02/19 0338 05/03/19 0429 05/04/19 0505  NA 139 140 134* 136  K 5.3* 4.4 4.4 4.6  CL 102 104 100 100  CO2 26 28 24 29   GLUCOSE 112* 139* 132* 93  BUN 19 19 19 19   CREATININE 0.74 0.66 0.67 0.66  CALCIUM 9.2 9.7 8.5* 8.5*    Liver Function Tests: Recent Labs  Lab 05/02/19 0338  AST 25  ALT 18  ALKPHOS 59  BILITOT 0.8  PROT 7.9  ALBUMIN 4.2   No results for input(s): LIPASE, AMYLASE in the last 168  hours. No results for input(s): AMMONIA in the last 168 hours. Coagulation Profile: No results for input(s): INR, PROTIME in the last 168 hours. Cardiac Enzymes: No results for input(s): CKTOTAL, CKMB, CKMBINDEX, TROPONINI in the last 168 hours. BNP (last 3 results) No results for input(s): PROBNP in the last 8760 hours. CBG: No results for input(s): GLUCAP in the last 168 hours. Studies: No results found.   Time spent: 35 minutes  Author: Berle Mull, MD [facility] 05/04/2019 5:49 PM  To reach On-call, see care teams to locate the attending and reach out to them via [email or web address]. If 7PM-7AM, please contact night-coverage If you still have difficulty reaching the attending provider, please page the [facility] (Director on Call) for [facility] on amion for assistance.

## 2019-05-04 NOTE — NC FL2 (Signed)
[place] LEVEL OF CARE SCREENING TOOL     IDENTIFICATION  Patient Name: [name] Birthdate: 04/17/1922 Sex: female Admission Date (Current Location): 05/01/2019  [facility] and [place] Number:  Herbalist and Address:  [facility],  [place] [address] [place]      Provider Number: [id number]  Attending Physician Name and Address:  Lavina Hamman, MD  Relative Name and Phone Number:       Current Level of Care: SNF Recommended Level of Care: [place] Prior Approval Number:    Date Approved/Denied:   PASRR Number: [id number]  Discharge Plan: SNF    Current Diagnoses: Patient Active Problem List   Diagnosis Date Noted  . Closed left femoral fracture ([place]) 05/02/2019  . Left displaced femoral neck fracture ([place]) 05/02/2019  . Subclavian artery stenosis, left ([place]) 07/30/2018  . Elevated troponin   . Atrial fibrillation with RVR ([place])   . Anemia due to acute blood loss   . Chronic GI bleeding 12/06/2017  . Severe anemia 12/06/2017  . Pelvic mass in female 02/19/2017  . Gastroesophageal reflux disease without esophagitis 01/31/2016  . Hoarseness 01/31/2016  . CAD (coronary artery disease)   . Abnormality of gait 09/07/2015  . Left carotid bruit 08/10/2015  . (HFpEF) heart failure with preserved ejection fraction ([place]) 05/08/2015  . ARF (acute renal failure) ([place]) 05/08/2015  . Renal failure 05/08/2015  . PAF (paroxysmal atrial fibrillation) ([place]) 09/16/2014  . Chronic anticoagulation, with coumadin, with PAF and CHADS2Vasc2 score of 4 09/16/2014  . Anemia, iron deficiency 09/16/2014  . Non-ST elevation myocardial infarction (NSTEMI), initial care episode, secondary to PAF most likely 09/13/2014  . NSTEMI (non-ST elevated myocardial infarction) ([place]) 09/13/2014  . Memory disorder 03/05/2014  . Pulmonary hypertension ([place]) 12/10/2013  . Severe tricuspid regurgitation 12/10/2013  . Non-rheumatic  mitral regurgitation 12/10/2013  . Dyspnea on exertion 11/03/2013  . Edema of both legs 11/03/2013  . Valvular heart disease 11/03/2013  . Bradycardia 09/02/2013  . Mild aortic stenosis 03/03/2013  . Moderate to severe pulmonary hypertension ([place]) 03/03/2013  . Essential hypertension 03/03/2013  . Mixed hyperlipidemia 03/03/2013  . Polyneuropathy in other diseases classified elsewhere ([place]) 11/10/2012    Orientation RESPIRATION BLADDER Height & Weight     Self, Time, Situation, Place  Normal External catheter Weight: 50.9 kg Height:  5\' 2"  (157.5 cm)  BEHAVIORAL SYMPTOMS/MOOD NEUROLOGICAL BOWEL NUTRITION STATUS      Continent Diet(Regular thin liquid)  AMBULATORY STATUS COMMUNICATION OF NEEDS Skin   Extensive Assist Verbally Surgical wounds(left hip)                       Personal Care Assistance Level of Assistance  Bathing, Feeding, Dressing, Total care Bathing Assistance: Limited assistance Feeding assistance: Limited assistance Dressing Assistance: Limited assistance Total Care Assistance: Limited assistance   Functional Limitations Info             SPECIAL CARE FACTORS FREQUENCY  PT (By licensed PT), OT (By licensed OT)     PT Frequency: x5 OT Frequency: x5            Contractures Contractures Info: Not present    Additional Factors Info  Code Status Code Status Info: DNR             Current Medications (05/04/2019):  This is the current hospital active medication list Current Facility-Administered Medications  Medication Dose Route Frequency Provider Last Rate Last Dose  .  acetaminophen (TYLENOL) tablet 325-650 mg  325-650 mg Oral Q6H PRN Marybelle Killings, MD      . amiodarone (PACERONE) tablet 100 mg  100 mg Oral Daily Marybelle Killings, MD   100 mg at 05/04/19 1022  . atorvastatin (LIPITOR) tablet 5 mg  5 mg Oral Daily Marybelle Killings, MD   5 mg at 05/04/19 1022  . docusate sodium (COLACE) capsule 100 mg  100 mg Oral Daily Lavina Hamman, MD   100  mg at 05/04/19 1023  . enoxaparin (LOVENOX) injection 40 mg  40 mg Subcutaneous Q24H Marybelle Killings, MD   40 mg at 05/04/19 1023  . famotidine (PEPCID) tablet 20 mg  20 mg Oral Daily Marybelle Killings, MD   20 mg at 05/04/19 1022  . feeding supplement (ENSURE ENLIVE) (ENSURE ENLIVE) liquid 237 mL  237 mL Oral BID BM Marybelle Killings, MD   237 mL at 05/04/19 1023  . fluticasone (FLONASE) 50 MCG/ACT nasal spray 1 spray  1 spray Each Nare Daily Marybelle Killings, MD   1 spray at 05/04/19 1023  . gabapentin (NEURONTIN) capsule 300 mg  300 mg Oral TID WC Marybelle Killings, MD   300 mg at 05/04/19 1022   And  . gabapentin (NEURONTIN) capsule 900 mg  900 mg Oral QHS Marybelle Killings, MD   900 mg at 05/03/19 2230  . HYDROcodone-acetaminophen (NORCO) 7.5-325 MG per tablet 1-2 tablet  1-2 tablet Oral Q4H PRN Marybelle Killings, MD   1 tablet at 05/04/19 0720  . HYDROcodone-acetaminophen (NORCO/VICODIN) 5-325 MG per tablet 1-2 tablet  1-2 tablet Oral Q4H PRN Marybelle Killings, MD   2 tablet at 05/03/19 2256  . levETIRAcetam (KEPPRA) tablet 250 mg  250 mg Oral Daily Lavina Hamman, MD   250 mg at 05/04/19 1022   And  . levETIRAcetam (KEPPRA) tablet 500 mg  500 mg Oral QHS Lavina Hamman, MD   500 mg at 05/03/19 2231  . memantine (NAMENDA) tablet 10 mg  10 mg Oral BID Lavina Hamman, MD   10 mg at 05/04/19 1022  . menthol-cetylpyridinium (CEPACOL) lozenge 3 mg  1 lozenge Oral PRN Marybelle Killings, MD       Or  . phenol (CHLORASEPTIC) mouth spray 1 spray  1 spray Mouth/Throat PRN Marybelle Killings, MD      . mirtazapine (REMERON) tablet 7.5 mg  7.5 mg Oral QHS Lavina Hamman, MD   7.5 mg at 05/03/19 2231  . morphine 2 MG/ML injection 0.5-1 mg  0.5-1 mg Intravenous Q2H PRN Marybelle Killings, MD   1 mg at 05/03/19 1706  . multivitamin with minerals tablet 1 tablet  1 tablet Oral Daily Lavina Hamman, MD   1 tablet at 05/04/19 1022  . ondansetron (ZOFRAN) tablet 4 mg  4 mg Oral Q6H PRN Elwyn Reach, MD       Or  . ondansetron (ZOFRAN)  injection 4 mg  4 mg Intravenous Q6H PRN Elwyn Reach, MD         Discharge Medications: Please see discharge summary for a list of discharge medications.  Relevant Imaging Results:  Relevant Lab Results:   Additional Information SS# [id number]  Purcell Mouton, RN

## 2019-05-04 NOTE — Progress Notes (Signed)
Pt in the chair and requested to return to the bed. Oxygen removed to check O2 saturation on room while Pt being placed back to bed. Pt placed back to bed and on room air O2 saturation 82%. Oxygen placed on at 2l [place] and O2 Saturations 92-94%.Maintain current plan of care for Pt

## 2019-05-04 NOTE — TOC Initial Note (Addendum)
Transition of Care [facility]) - Initial/Assessment Note    Patient Details  Name: [name] MRN: [id number] Date of Birth: 1922/02/22  Transition of Care [facility]) CM/SW Contact:    Purcell Mouton, RN Phone Number: 05/04/2019, 11:29 AM  Clinical Narrative:                 Pt from [place]. Pt will return to [place] to SNF. A call was made to pt's son Nahima Nothnagel to confirm that the pt would transfer to SNF. Pt's son seem not to understand pt would go to [facility] [facility] for quarantine/ Admission Coordinators Otilio Carpen and Joellen Jersey at [facility]. Explained this information to pt serveal times and that [facility] would be able to care for pt's needs. [facility] is aware that pt would be discharge in am. Raquel Sarna, CSW from [facility] called to give reporting telephone and entrance information for PTAR. RN to call report to [phone] ext [phone], [phone] ask for [facility] Nurse. Pt's room #925/ PTAR to enter [facility]   Expected Discharge Plan: [place]     Patient Goals and CMS Choice Patient states their goals for this hospitalization and ongoing recovery are:: To get better   Choice offered to / list presented to : Patient, NA(Pt from [place])  Expected Discharge Plan and Services Expected Discharge Plan: [place]   Discharge Planning Services: CM Consult Post Acute Care Choice: [place] Living arrangements for the past 2 months: [place] Expected Discharge Date: 05/06/19                                    Prior Living Arrangements/Services Living arrangements for the past 2 months: [place] Lives with:: Self Patient language and need for interpreter reviewed:: No                 Activities of Daily Living Home Assistive Devices/Equipment: Built-in shower seat, Environmental consultant (specify type), Wheelchair, Eyeglasses ADL Screening  (condition at time of admission) Patient's cognitive ability adequate to safely complete daily activities?: Yes Is the patient deaf or have difficulty hearing?: No Does the patient have difficulty seeing, even when wearing glasses/contacts?: No Does the patient have difficulty concentrating, remembering, or making decisions?: No Patient able to express need for assistance with ADLs?: Yes Does the patient have difficulty dressing or bathing?: Yes Independently performs ADLs?: Yes (appropriate for developmental age) Does the patient have difficulty walking or climbing stairs?: Yes Weakness of Legs: Left Weakness of Arms/Hands: None  Permission Sought/Granted Permission sought to share information with : Case Manager                Emotional Assessment Appearance:: Appears stated age     Orientation: : Oriented to Self, Oriented to Place, Oriented to  Time, Oriented to Situation      Admission diagnosis:  Closed fracture of neck of left femur, initial encounter ([place]) [S72.002A] Closed left femoral fracture ([place]) [S72.92XA] Patient Active Problem List   Diagnosis Date Noted  . Closed left femoral fracture ([place]) 05/02/2019  . Left displaced femoral neck fracture ([place]) 05/02/2019  . Subclavian artery stenosis, left ([place]) 07/30/2018  . Elevated troponin   . Atrial fibrillation with RVR ([place])   . Anemia due to acute blood loss   . Chronic GI bleeding 12/06/2017  . Severe anemia 12/06/2017  . Pelvic mass  in female 02/19/2017  . Gastroesophageal reflux disease without esophagitis 01/31/2016  . Hoarseness 01/31/2016  . CAD (coronary artery disease)   . Abnormality of gait 09/07/2015  . Left carotid bruit 08/10/2015  . (HFpEF) heart failure with preserved ejection fraction ([place]) 05/08/2015  . ARF (acute renal failure) ([place]) 05/08/2015  . Renal failure 05/08/2015  . PAF (paroxysmal atrial fibrillation) ([place]) 09/16/2014  . Chronic anticoagulation, with coumadin, with PAF and  CHADS2Vasc2 score of 4 09/16/2014  . Anemia, iron deficiency 09/16/2014  . Non-ST elevation myocardial infarction (NSTEMI), initial care episode, secondary to PAF most likely 09/13/2014  . NSTEMI (non-ST elevated myocardial infarction) ([place]) 09/13/2014  . Memory disorder 03/05/2014  . Pulmonary hypertension ([place]) 12/10/2013  . Severe tricuspid regurgitation 12/10/2013  . Non-rheumatic mitral regurgitation 12/10/2013  . Dyspnea on exertion 11/03/2013  . Edema of both legs 11/03/2013  . Valvular heart disease 11/03/2013  . Bradycardia 09/02/2013  . Mild aortic stenosis 03/03/2013  . Moderate to severe pulmonary hypertension ([place]) 03/03/2013  . Essential hypertension 03/03/2013  . Mixed hyperlipidemia 03/03/2013  . Polyneuropathy in other diseases classified elsewhere ([place]) 11/10/2012   PCP:  Deland Pretty, MD Pharmacy:   [facility], [place] [address] Phone: [phone] Fax: [phone]  [place], [place] [address] Phone: [phone] Fax: [phone]     Social Determinants of Health (SDOH) Interventions    Readmission Risk Interventions No flowsheet data found.

## 2019-05-04 NOTE — Progress Notes (Signed)
Physical Therapy Treatment Patient Details Name: [name] MRN: [id number] DOB: [birth year] Today's Date: 05/04/2019    History of Present Illness 85 yo female admitted with L fem neck fx. S/P hemiarthroplasty 05/02/19. Hx of CAD, CHF, A fib, OA, chronic pain, neuropathy, anemia, memory disorder    PT Comments    Pt appears to have increased pain today compared to yesterday. She was able to take a few steps in the room. She also appears weaker and required increased assistance. Continue to recommend SNF.   Follow Up Recommendations  SNF     Equipment Recommendations  None recommended by PT    Recommendations for Other Services       Precautions / Restrictions Precautions Precautions: Fall;Posterior Hip Precaution Comments: Pt unable to recall posterior hip precautions. Reviewed all precautions with pt on today Restrictions Weight Bearing Restrictions: No LLE Weight Bearing: Weight bearing as tolerated    Mobility  Bed Mobility Overal bed mobility: Needs Assistance Bed Mobility: Supine to Sit     Supine to sit: Max assist;+2 for physical assistance;+2 for safety/equipment;HOB elevated     General bed mobility comments: Assist for trunk and bil LEs. Utilized bedpad for positioning, scooting. Increased time. Multimodal cueing for safety, technique, adherence to precautions.  Transfers Overall transfer level: Needs assistance Equipment used: Rolling walker (2 wheeled) Transfers: Sit to/from Stand Sit to Stand: Mod assist;+2 physical assistance;+2 safety/equipment;From elevated surface         General transfer comment: Assist to rise, stabilize, control descent. Multimodal cueing for safety, technique, hand/LE placement, adherence to precautions. Pt had jerking of UEs/difficulty keeping hands on walker while sitting EOB  Ambulation/Gait Ambulation/Gait assistance: Mod assist;+2 physical assistance;+2 safety/equipment Gait Distance (Feet): 5 Feet Assistive  device: Rolling walker (2 wheeled) Gait Pattern/deviations: Wide base of support;Trunk flexed;Step-to pattern     General Gait Details: Assist to stabilize pt and maneuver RW throughout very short distance. VCs safety, sequence, distance from RW. Pt was fatigued after short walk. Less steady today compared to yesterday.   Stairs             Wheelchair Mobility    Modified Rankin (Stroke Patients Only)       Balance Overall balance assessment: Needs assistance         Standing balance support: Bilateral upper extremity supported Standing balance-Leahy Scale: Poor                              Cognition Arousal/Alertness: Awake/alert Behavior During Therapy: WFL for tasks assessed/performed Overall Cognitive Status: History of cognitive impairments - at baseline                                        Exercises      General Comments        Pertinent Vitals/Pain Pain Assessment: Faces Faces Pain Scale: Hurts whole lot Pain Location: L hip with activity Pain Descriptors / Indicators: Operative site guarding;Aching;Grimacing;Moaning;Discomfort Pain Intervention(s): Monitored during session;Premedicated before session;Limited activity within patient's tolerance;Repositioned    Home Living                      Prior Function            PT Goals (current goals can now be found in the care plan section) Progress towards PT goals: Progressing toward goals  Frequency    Min 3X/week      PT Plan Current plan remains appropriate    Co-evaluation              AM-PAC PT "6 Clicks" Mobility   Outcome Measure  Help needed turning from your back to your side while in a flat bed without using bedrails?: A Lot Help needed moving from lying on your back to sitting on the side of a flat bed without using bedrails?: A Lot Help needed moving to and from a bed to a chair (including a wheelchair)?: A Lot Help needed  standing up from a chair using your arms (e.g., wheelchair or bedside chair)?: A Lot Help needed to walk in hospital room?: A Lot Help needed climbing 3-5 steps with a railing? : Total 6 Click Score: 11    End of Session Equipment Utilized During Treatment: Gait belt Activity Tolerance: Patient limited by fatigue;Patient limited by pain Patient left: in chair;with call bell/phone within reach;with chair alarm set   PT Visit Diagnosis: History of falling (Z91.81);Difficulty in walking, not elsewhere classified (R26.2);Muscle weakness (generalized) (M62.81);Pain Pain - Right/Left: Left Pain - part of body: Hip     Time: [id number] PT Time Calculation (min) (ACUTE ONLY): 20 min  Charges:  $Therapeutic Activity: 8-22 mins                        Weston Anna, PT Acute Rehabilitation Services Pager: [phone] Office: [phone]

## 2019-05-05 ENCOUNTER — Encounter: Payer: Self-pay | Admitting: Internal Medicine

## 2019-05-05 ENCOUNTER — Non-Acute Institutional Stay (SKILLED_NURSING_FACILITY): Payer: Medicare Other | Admitting: Internal Medicine

## 2019-05-05 DIAGNOSIS — D62 Acute posthemorrhagic anemia: Secondary | ICD-10-CM | POA: Diagnosis not present

## 2019-05-05 DIAGNOSIS — Z7401 Bed confinement status: Secondary | ICD-10-CM | POA: Diagnosis not present

## 2019-05-05 DIAGNOSIS — R413 Other amnesia: Secondary | ICD-10-CM | POA: Diagnosis not present

## 2019-05-05 DIAGNOSIS — D509 Iron deficiency anemia, unspecified: Secondary | ICD-10-CM | POA: Diagnosis not present

## 2019-05-05 DIAGNOSIS — K219 Gastro-esophageal reflux disease without esophagitis: Secondary | ICD-10-CM | POA: Diagnosis not present

## 2019-05-05 DIAGNOSIS — R0902 Hypoxemia: Secondary | ICD-10-CM

## 2019-05-05 DIAGNOSIS — I480 Paroxysmal atrial fibrillation: Secondary | ICD-10-CM

## 2019-05-05 DIAGNOSIS — K529 Noninfective gastroenteritis and colitis, unspecified: Secondary | ICD-10-CM | POA: Diagnosis not present

## 2019-05-05 DIAGNOSIS — D500 Iron deficiency anemia secondary to blood loss (chronic): Secondary | ICD-10-CM | POA: Diagnosis not present

## 2019-05-05 DIAGNOSIS — S72002A Fracture of unspecified part of neck of left femur, initial encounter for closed fracture: Secondary | ICD-10-CM | POA: Diagnosis not present

## 2019-05-05 DIAGNOSIS — E782 Mixed hyperlipidemia: Secondary | ICD-10-CM | POA: Diagnosis not present

## 2019-05-05 DIAGNOSIS — R5381 Other malaise: Secondary | ICD-10-CM | POA: Diagnosis not present

## 2019-05-05 DIAGNOSIS — Z91040 Latex allergy status: Secondary | ICD-10-CM | POA: Diagnosis not present

## 2019-05-05 DIAGNOSIS — K922 Gastrointestinal hemorrhage, unspecified: Secondary | ICD-10-CM | POA: Diagnosis not present

## 2019-05-05 DIAGNOSIS — I509 Heart failure, unspecified: Secondary | ICD-10-CM | POA: Diagnosis not present

## 2019-05-05 DIAGNOSIS — Z9889 Other specified postprocedural states: Secondary | ICD-10-CM | POA: Diagnosis not present

## 2019-05-05 DIAGNOSIS — S7292XA Unspecified fracture of left femur, initial encounter for closed fracture: Secondary | ICD-10-CM | POA: Diagnosis not present

## 2019-05-05 DIAGNOSIS — I38 Endocarditis, valve unspecified: Secondary | ICD-10-CM | POA: Diagnosis not present

## 2019-05-05 DIAGNOSIS — I2510 Atherosclerotic heart disease of native coronary artery without angina pectoris: Secondary | ICD-10-CM | POA: Diagnosis not present

## 2019-05-05 DIAGNOSIS — I272 Pulmonary hypertension, unspecified: Secondary | ICD-10-CM

## 2019-05-05 DIAGNOSIS — Z20828 Contact with and (suspected) exposure to other viral communicable diseases: Secondary | ICD-10-CM | POA: Diagnosis not present

## 2019-05-05 DIAGNOSIS — Z87891 Personal history of nicotine dependence: Secondary | ICD-10-CM | POA: Diagnosis not present

## 2019-05-05 DIAGNOSIS — Z96642 Presence of left artificial hip joint: Secondary | ICD-10-CM

## 2019-05-05 DIAGNOSIS — D649 Anemia, unspecified: Secondary | ICD-10-CM | POA: Diagnosis not present

## 2019-05-05 DIAGNOSIS — G63 Polyneuropathy in diseases classified elsewhere: Secondary | ICD-10-CM | POA: Diagnosis not present

## 2019-05-05 DIAGNOSIS — S72042D Displaced fracture of base of neck of left femur, subsequent encounter for closed fracture with routine healing: Secondary | ICD-10-CM | POA: Diagnosis not present

## 2019-05-05 DIAGNOSIS — R2681 Unsteadiness on feet: Secondary | ICD-10-CM | POA: Diagnosis not present

## 2019-05-05 DIAGNOSIS — Z79899 Other long term (current) drug therapy: Secondary | ICD-10-CM | POA: Diagnosis not present

## 2019-05-05 DIAGNOSIS — R7889 Finding of other specified substances, not normally found in blood: Secondary | ICD-10-CM | POA: Diagnosis not present

## 2019-05-05 DIAGNOSIS — Z03818 Encounter for observation for suspected exposure to other biological agents ruled out: Secondary | ICD-10-CM | POA: Diagnosis not present

## 2019-05-05 DIAGNOSIS — R41841 Cognitive communication deficit: Secondary | ICD-10-CM | POA: Diagnosis not present

## 2019-05-05 DIAGNOSIS — M2550 Pain in unspecified joint: Secondary | ICD-10-CM | POA: Diagnosis not present

## 2019-05-05 DIAGNOSIS — I10 Essential (primary) hypertension: Secondary | ICD-10-CM | POA: Diagnosis not present

## 2019-05-05 DIAGNOSIS — R29898 Other symptoms and signs involving the musculoskeletal system: Secondary | ICD-10-CM | POA: Diagnosis not present

## 2019-05-05 DIAGNOSIS — I110 Hypertensive heart disease with heart failure: Secondary | ICD-10-CM | POA: Diagnosis not present

## 2019-05-05 DIAGNOSIS — M6281 Muscle weakness (generalized): Secondary | ICD-10-CM | POA: Diagnosis not present

## 2019-05-05 DIAGNOSIS — R52 Pain, unspecified: Secondary | ICD-10-CM | POA: Diagnosis not present

## 2019-05-05 DIAGNOSIS — R197 Diarrhea, unspecified: Secondary | ICD-10-CM | POA: Diagnosis not present

## 2019-05-05 LAB — CBC
HCT: 24.2 % — ABNORMAL LOW (ref 36.0–46.0)
Hemoglobin: 7.8 g/dL — ABNORMAL LOW (ref 12.0–15.0)
MCH: 31.2 pg (ref 26.0–34.0)
MCHC: 32.2 g/dL (ref 30.0–36.0)
MCV: 96.8 fL (ref 80.0–100.0)
Platelets: 167 10*3/uL (ref 150–400)
RBC: 2.5 MIL/uL — ABNORMAL LOW (ref 3.87–5.11)
RDW: 12.7 % (ref 11.5–15.5)
WBC: 8.2 10*3/uL (ref 4.0–10.5)
nRBC: 0 % (ref 0.0–0.2)

## 2019-05-05 MED ORDER — DOCUSATE SODIUM 100 MG PO CAPS: 100.0000 mg | ORAL_CAPSULE | Freq: Every day | ORAL | 0 refills | Status: DC

## 2019-05-05 MED ORDER — ASPIRIN 325 MG PO TABS: 325.0000 mg | ORAL_TABLET | Freq: Every day | ORAL | 0 refills | Status: DC

## 2019-05-05 MED ORDER — HYDROCODONE-ACETAMINOPHEN 5-325 MG PO TABS: 1.0000 | ORAL_TABLET | ORAL | 0 refills | Status: DC | PRN

## 2019-05-05 NOTE — Plan of Care (Signed)
  Problem: Education: Goal: Knowledge of General Education information will improve Description: Including pain rating scale, medication(s)/side effects and non-pharmacologic comfort measures Outcome: Adequate for Discharge   Problem: Health Behavior/Discharge Planning: Goal: Ability to manage health-related needs will improve Outcome: Adequate for Discharge   Problem: Clinical Measurements: Goal: Ability to maintain clinical measurements within normal limits will improve Outcome: Adequate for Discharge Goal: Will remain free from infection Outcome: Adequate for Discharge Goal: Diagnostic test results will improve Outcome: Adequate for Discharge Goal: Respiratory complications will improve Outcome: Adequate for Discharge Goal: Cardiovascular complication will be avoided Outcome: Adequate for Discharge   Problem: Activity: Goal: Risk for activity intolerance will decrease Outcome: Adequate for Discharge   Problem: Nutrition: Goal: Adequate nutrition will be maintained Outcome: Adequate for Discharge   Problem: Coping: Goal: Level of anxiety will decrease Outcome: Adequate for Discharge   Problem: Elimination: Goal: Will not experience complications related to bowel motility Outcome: Adequate for Discharge Goal: Will not experience complications related to urinary retention Outcome: Adequate for Discharge   Problem: Pain Managment: Goal: General experience of comfort will improve Outcome: Adequate for Discharge   Problem: Safety: Goal: Ability to remain free from injury will improve Outcome: Adequate for Discharge   Problem: Skin Integrity: Goal: Risk for impaired skin integrity will decrease Outcome: Adequate for Discharge   Problem: Education: Goal: Verbalization of understanding the information provided (i.e., activity precautions, restrictions, etc) will improve Outcome: Adequate for Discharge Goal: Individualized Educational Video(s) Outcome: Adequate for  Discharge   Problem: Activity: Goal: Ability to ambulate and perform ADLs will improve Outcome: Adequate for Discharge   Problem: Clinical Measurements: Goal: Postoperative complications will be avoided or minimized Outcome: Adequate for Discharge   Problem: Self-Concept: Goal: Ability to maintain and perform role responsibilities to the fullest extent possible will improve Outcome: Adequate for Discharge   Problem: Pain Management: Goal: Pain level will decrease Outcome: Adequate for Discharge   Problem: Increased Nutrient Needs (NI-5.1) Goal: Food and/or nutrient delivery Description: Individualized approach for food/nutrient provision. Outcome: Adequate for Discharge   Problem: Acute Rehab PT Goals(only PT should resolve) Goal: Pt Will Go Supine/Side To Sit Outcome: Adequate for Discharge Goal: Pt Will Go Sit To Supine/Side Outcome: Adequate for Discharge Goal: Patient Will Transfer Sit To/From Stand Outcome: Adequate for Discharge Goal: Pt Will Ambulate Outcome: Adequate for Discharge   Problem: Acute Rehab OT Goals (only OT should resolve) Goal: Pt. Will Perform Grooming Outcome: Adequate for Discharge Goal: Pt. Will Perform Lower Body Bathing Outcome: Adequate for Discharge Goal: Pt. Will Perform Lower Body Dressing Outcome: Adequate for Discharge Goal: Pt. Will Transfer To Toilet Outcome: Adequate for Discharge Goal: Pt. Will Perform Toileting-Clothing Manipulation Outcome: Adequate for Discharge

## 2019-05-05 NOTE — Care Management Important Message (Signed)
Important Message  Patient Details IM Letter given to [name] RN to present to the Patient Name: [name] MRN: [id number] Date of Birth: [birth year]   Medicare Important Message Given:  Yes     Kerin Salen 05/05/2019, 9:56 AM

## 2019-05-05 NOTE — Progress Notes (Signed)
Called report to [facility]. Questions asked and answered. Spoke with [facility] Nurse.

## 2019-05-05 NOTE — Progress Notes (Signed)
Nsg Discharge Note  Admit Date:  05/01/2019 Discharge date: 05/05/2019   [name] to be D/C'd Skilled nursing facility per MD order.  AVS completed.  Copy for chart, and copy for patient signed, and dated. Patient/caregiver able to verbalize understanding.  Discharge Medication: Allergies as of 05/05/2019      Reactions   Avelox [moxifloxacin Hcl In [place]] Shortness Of Breath, Swelling   Moxifloxacin Other (See Comments), Shortness Of Breath, Swelling   other   Aricept [donepezil Hcl] Diarrhea   Codeine Swelling      Donepezil Diarrhea   Garlic Diarrhea   Latex Hives, Itching, Rash, Other (See Comments)   Watery blisters    Onion Diarrhea   Sulfa Drugs Cross Reactors Swelling   Duloxetine Rash   Polysporin [bacitracin-polymyxin B] Other (See Comments)   other      Medication List    STOP taking these medications   amLODipine 2.5 MG tablet Commonly known as: NORVASC   amoxicillin-clavulanate 875-125 MG tablet Commonly known as: AUGMENTIN   colestipol 1 g tablet Commonly known as: COLESTID   loperamide 2 MG capsule Commonly known as: IMODIUM     TAKE these medications   acetaminophen 500 MG tablet Commonly known as: TYLENOL Take 500 mg by mouth every 6 (six) hours.   amiodarone 100 MG tablet Commonly known as: PACERONE Take 1 tablet (100 mg total) by mouth daily.   aspirin 325 MG tablet Commonly known as: Bayer Aspirin Take 1 tablet (325 mg total) by mouth daily.   atorvastatin 10 MG tablet Commonly known as: LIPITOR Take 5 mg by mouth daily.   AVEENO ADVANCED CARE EX Apply 1 application topically 2 (two) times daily as needed (dry skin (on feet and after showering)).   calcium carbonate 1250 (500 Ca) MG chewable tablet Commonly known as: OS-CAL Chew 1 tablet by mouth daily.   cetirizine 10 MG tablet Commonly known as: ZYRTEC Take 10 mg by mouth daily with breakfast.   cholecalciferol 1000 units tablet Commonly known as: VITAMIN D Take  2,000 Units by mouth daily.   docusate sodium 100 MG capsule Commonly known as: COLACE Take 1 capsule (100 mg total) by mouth daily. Start taking on: May 06, 2019   Ensure Plus Liqd Take 237 mLs by mouth daily. At lunch   famotidine 20 MG tablet Commonly known as: PEPCID Take 20 mg by mouth daily.   fluticasone 50 MCG/ACT nasal spray Commonly known as: FLONASE Place 1 spray into both nostrils daily.   gabapentin 300 MG capsule Commonly known as: NEURONTIN TAKE 1 CAPSULE THREE TIMES DAILY AND 3 CAPSULES AT BEDTIME What changed: See the new instructions.   HYDROcodone-acetaminophen 5-325 MG tablet Commonly known as: Norco Take 1 tablet by mouth every 4 (four) hours as needed for moderate pain.   levETIRAcetam 250 MG tablet Commonly known as: KEPPRA 1/2 tablet in the morning and 2 in the evening What changed:   how much to take  how to take this  when to take this  additional instructions   memantine 10 MG tablet Commonly known as: NAMENDA TAKE 1 TABLET TWICE DAILY   mirtazapine 7.5 MG tablet Commonly known as: REMERON Take 7.5 mg by mouth at bedtime.   multivitamin with minerals Tabs tablet Take 1 tablet by mouth daily.   pantoprazole 40 MG tablet Commonly known as: PROTONIX Take 40 mg by mouth daily.   vitamin B-12 1000 MCG tablet Commonly known as: CYANOCOBALAMIN Take 1,000 mcg by mouth daily.  Discharge Care Instructions  (From admission, onward)         Start     Ordered   05/05/19 0000  Weight bearing as tolerated     05/05/19 0734          Discharge Assessment: Vitals:   05/04/19 2142 05/05/19 0546  BP: (!) 86/48 (!) 102/53  Pulse: 66 64  Resp: 16 16  Temp: 98.2 F (36.8 C) 98.2 F (36.8 C)  SpO2: 99% 95%   Skin clean, dry and intact without evidence of skin break down, no evidence of skin tears noted. IV catheter discontinued intact. Site without signs and symptoms of complications - no redness or edema noted  at insertion site, patient denies c/o pain - only slight tenderness at site.  Dressing with slight pressure applied.  D/c Instructions-Education: Discharge instructions given to patient/family with verbalized understanding. D/c education completed with patient/family including follow up instructions, medication list, d/c activities limitations if indicated, with other d/c instructions as indicated by MD - patient able to verbalize understanding, all questions fully answered. Patient instructed to return to ED, call 911, or call MD for any changes in condition.  Patient escorted via [place], and D/C home via private auto.  Eda Keys, RN 05/05/2019 11:27 AM

## 2019-05-05 NOTE — Discharge Summary (Signed)
[facility] Discharge Summary   Patient: [name] [id number]   PCP: Deland Pretty, MD DOB: 04-Apr-1922   Date of admission: 05/01/2019   Date of discharge:  05/05/2019    Discharge Diagnoses:  Principal diagnosis Left displaced femoral neck fracture closed.  Active Problems:   Essential hypertension   Mixed hyperlipidemia   Pulmonary hypertension (HCC)   Memory disorder   PAF (paroxysmal atrial fibrillation) (HCC)   CAD (coronary artery disease)   Atrial fibrillation with RVR (HCC)   Gastroesophageal reflux disease without esophagitis   Left displaced femoral neck fracture ([place])   Admitted From: ALF Disposition:  SNF with therapy  Recommendations for Outpatient Follow-up:  1. PCP: Discussed about secondary osteoporosis prevention measures 2. Follow up LABS/TEST: CBC in 1 week  Follow-up Information    Lanae Crumbly, PA-C Follow up in 2 week(s).   Specialties: Physician Assistant, Orthopedic Surgery Contact information: [place] [address] [phone]        Deland Pretty, MD. Schedule an appointment as soon as possible for a visit in 1 week(s).   Specialty: Internal Medicine Contact information: [place] [address] [phone]          Diet recommendation: Cardiac diet  Activity: The patient is advised to gradually reintroduce usual activities,as tolerated .  Discharge Condition: good  Code Status: DNR   History of present illness: As per the H and P dictated on admission, "[name] is a 85 y.o. female with medical history significant of coronary artery disease, CHF, paroxysmal atrial fibrillation, GERD, hypertension, hyperlipidemia who is a resident of [facility] and sustained a mechanical fall there today.  Patient was turning to put on lights in the kitchen when she lost her footing and fell to the ground.  She hit her hips as well as the back of her head to the  floor.  No loss of consciousness.  She has significant pain right away on the left hip side.  Was brought to the ER where she was seen and evaluated.  She was found to have left femoral neck fracture.  Orthopedics consulted Dr. Lorin Mercy and patient is being admitted to the medical service for medical clearance.  She most likely will require repair of her fracture.  She denied any fever or chills denies any nausea vomiting or diarrhea.  Denied any loss of consciousness.  She has mild headache otherwise."  Hospital Course:  Summary of her active problems in the hospital is as following. 1.  Left femoral neck fracture. Closed. Discussed with Dr. Lorin Mercy S/P left hip monopolar hemiarthroplasty on 05/02/2019. Tolerated procedure well. Pain well controlled. PT OT recommended SNF.  Social worker arranged SNF.  COVID test negative. Outpatient pain management DVT prophylaxis per orthopedics.  2.  Essential hypertension. Coronary disease. Paroxysmal A. fib Moderate aortic stenosis. Chronic diastolic CHF. Echocardiogram was performed as a part of preop evaluation which does not show any evidence of acute worsening of above findings. Blood pressure and heart rate well controlled. We will continue current regimen.  Which includes amiodarone.  Norvasc. Patient is not on anticoagulation secondary to GI bleed.  3.  GERD. History of GI bleed. Postop acute blood loss anemia Continue PPI. Postoperatively hemoglobin dropped from 13-9.4. Further drop from back to 8.0 and 7.8 is likely dilutional in nature. Does not have any active evidence of bleeding. Orthopedic there is no hematoma at the surgical site. Recommend a repeat CBC in 1 week.  4.  Dementia. Monitor for delirium postoperatively. No evidence of delirium so far. Continue home regimen.  5.  Chronic neuropathy. Continue gabapentin.  6.  Chronic diarrhea Patient reports she has multiple home on chronic basis. She takes colestipol and  Imodium for the same. Currently she is actually constipated in the hospital postoperatively. We will continue the stool softener and hold colestipol and Imodium but these medicines should be resume if she started having diarrhea again. If the patient remains constipated tomorrow we will hold colestipol.  7.  On Keppra Do not see any prior history of seizure disorder or further work-up. Suspect this is for polyneuropathy. Continue to follow-up with neurology as scheduled.  8. Pain control  - [place] Controlled Substance Reporting System database was reviewed. - 7 day supply was provided. - Patient was instructed, not to drive, operate heavy machinery, perform activities at heights, swimming or participation in water activities or provide baby sitting services while on Pain, Sleep and Anxiety Medications; until her outpatient Physician has advised to do so again.  - Also recommended to not to take more than prescribed Pain, Sleep and Anxiety Medications.  Patient was seen by physical therapy, who recommended SNF, which was arranged by Education officer, museum. On the day of the discharge the patient's vitals were stable, and no other acute medical condition were reported by patient. the patient was felt safe to be discharge at SNF with SNF.  Consultants: Orthopedics Procedures: Left hip arthroplasty  DISCHARGE MEDICATION: Allergies as of 05/05/2019      Reactions   Avelox [moxifloxacin Hcl In Nacl] Shortness Of Breath, Swelling   Moxifloxacin Other (See Comments), Shortness Of Breath, Swelling   other   Aricept [donepezil Hcl] Diarrhea   Codeine Swelling      Donepezil Diarrhea   Garlic Diarrhea   Latex Hives, Itching, Rash, Other (See Comments)   Watery blisters    Onion Diarrhea   Sulfa Drugs Cross Reactors Swelling   Duloxetine Rash   Polysporin [bacitracin-polymyxin B] Other (See Comments)   other      Medication List    STOP taking these medications   amLODipine 2.5 MG  tablet Commonly known as: NORVASC   amoxicillin-clavulanate 875-125 MG tablet Commonly known as: AUGMENTIN   colestipol 1 g tablet Commonly known as: COLESTID   loperamide 2 MG capsule Commonly known as: IMODIUM     TAKE these medications   acetaminophen 500 MG tablet Commonly known as: TYLENOL Take 500 mg by mouth every 6 (six) hours.   amiodarone 100 MG tablet Commonly known as: PACERONE Take 1 tablet (100 mg total) by mouth daily.   aspirin 325 MG tablet Commonly known as: Bayer Aspirin Take 1 tablet (325 mg total) by mouth daily.   atorvastatin 10 MG tablet Commonly known as: LIPITOR Take 5 mg by mouth daily.   AVEENO ADVANCED CARE EX Apply 1 application topically 2 (two) times daily as needed (dry skin (on feet and after showering)).   calcium carbonate 1250 (500 Ca) MG chewable tablet Commonly known as: OS-CAL Chew 1 tablet by mouth daily.   cetirizine 10 MG tablet Commonly known as: ZYRTEC Take 10 mg by mouth daily with breakfast.   cholecalciferol 1000 units tablet Commonly known as: VITAMIN D Take 2,000 Units by mouth daily.   docusate sodium 100 MG capsule Commonly known as: COLACE Take 1 capsule (100 mg total) by mouth daily. Start taking on: May 06, 2019   Ensure Plus Liqd Take 237 mLs by mouth  daily. At lunch   famotidine 20 MG tablet Commonly known as: PEPCID Take 20 mg by mouth daily.   fluticasone 50 MCG/ACT nasal spray Commonly known as: FLONASE Place 1 spray into both nostrils daily.   gabapentin 300 MG capsule Commonly known as: NEURONTIN TAKE 1 CAPSULE THREE TIMES DAILY AND 3 CAPSULES AT BEDTIME What changed: See the new instructions.   HYDROcodone-acetaminophen 5-325 MG tablet Commonly known as: Norco Take 1 tablet by mouth every 4 (four) hours as needed for moderate pain.   levETIRAcetam 250 MG tablet Commonly known as: KEPPRA 1/2 tablet in the morning and 2 in the evening What changed:   how much to take  how to  take this  when to take this  additional instructions   memantine 10 MG tablet Commonly known as: NAMENDA TAKE 1 TABLET TWICE DAILY   mirtazapine 7.5 MG tablet Commonly known as: REMERON Take 7.5 mg by mouth at bedtime.   multivitamin with minerals Tabs tablet Take 1 tablet by mouth daily.   pantoprazole 40 MG tablet Commonly known as: PROTONIX Take 40 mg by mouth daily.   vitamin B-12 1000 MCG tablet Commonly known as: CYANOCOBALAMIN Take 1,000 mcg by mouth daily.            Discharge Care Instructions  (From admission, onward)         Start     Ordered   05/05/19 0000  Weight bearing as tolerated     05/05/19 0734         Allergies  Allergen Reactions  . Avelox [Moxifloxacin Hcl In Nacl] Shortness Of Breath and Swelling  . Moxifloxacin Other (See Comments), Shortness Of Breath and Swelling    other  . Aricept [Donepezil Hcl] Diarrhea  . Codeine Swelling       . Donepezil Diarrhea  . Garlic Diarrhea  . Latex Hives, Itching, Rash and Other (See Comments)    Watery blisters   . Onion Diarrhea  . Sulfa Drugs Cross Reactors Swelling  . Duloxetine Rash  . Polysporin [Bacitracin-Polymyxin B] Other (See Comments)    other   Discharge Instructions    Diet - low sodium heart healthy   Complete by: As directed    Discharge instructions   Complete by: As directed    It is important that you read the given instructions as well as go over your medication list with RN to help you understand your care after this hospitalization.  Please follow-up with PCP in 1-2 weeks.  Please note that NO REFILLS for any discharge medications will be authorized once you are discharged, as it is imperative that you return to your primary care physician (or establish a relationship with a primary care physician if you do not have one) for your aftercare needs so that they can reassess your need for medications and monitor your lab values.  Please request your primary care  physician to go over all Hospital Tests and Procedure/Radiological results at the follow up. Please get all Hospital records sent to your PCP by signing hospital release before you go home.    Do not take more than prescribed Pain, Sleep and Anxiety Medications.  You were cared for by a hospitalist during your hospital stay. If you have any questions about your discharge medications or the care you received while you were in the hospital after you are discharged, you can call the unit @UNIT @ you were admitted to and ask to speak with the hospitalist Berle Mull. Ask for  Hospitalist on call if the hospitalist that took care of you is not available.   Once you are discharged, your primary care physician will handle any further medical issues.  You Must read complete instructions/literature along with all the possible adverse reactions/side effects for all the Medicines you take and that have been prescribed to you. Take any new Medicines after you have completely understood and accept all the possible adverse reactions/side effects.   Increase activity slowly   Complete by: As directed    Weight bearing as tolerated   Complete by: As directed      Discharge Exam: Filed Weights   05/01/19 2149 05/02/19 0232  Weight: 52.6 kg 50.9 kg   Vitals:   05/04/19 2142 05/05/19 0546  BP: (!) 86/48 (!) 102/53  Pulse: 66 64  Resp: 16 16  Temp: 98.2 F (36.8 C) 98.2 F (36.8 C)  SpO2: 99% 95%   General: Appear in no distress, no Rash; Oral Mucosa Clear, moist. no Abnormal Mass Or lumps Cardiovascular: S1 and S2 Present, so  Murmur, Respiratory: normal respiratory effort, Bilateral Air entry present and Clear to Auscultation, no Crackles, no wheezes Abdomen: Bowel Sound present, Soft and no tenderness, no hernia Extremities: no Pedal edema, no calf tenderness Neurology: alert and oriented to time, place, and person affect appropriate.  The results of significant diagnostics from this  hospitalization (including imaging, microbiology, ancillary and laboratory) are listed below for reference.    Significant Diagnostic Studies: Dg Chest 1 View  Result Date: 05/01/2019 CLINICAL DATA:  85 year female with left hip fracture. Preop evaluation. EXAM: CHEST  1 VIEW COMPARISON:  Chest radiograph dated 12/17/2017 FINDINGS: No focal consolidation, pleural effusion, pneumothorax. Borderline cardiomegaly. Calcification of the mitral annulus as well as atherosclerotic calcification of the aorta. No acute osseous pathology. Degenerative changes of spine. IMPRESSION: No active disease. Electronically Signed   By: Anner Crete M.D.   On: 05/01/2019 22:41   Dg Pelvis 1-2 Views  Result Date: 05/01/2019 CLINICAL DATA:  85 year old female with fall and left lower extremity pain. EXAM: PELVIS - 1-2 VIEW; LEFT FEMUR 2 VIEWS COMPARISON:  CT of the abdomen pelvis dated 02/08/2019 FINDINGS: Evaluation is limited due to positioning of the hips. There is a nondisplaced fracture of the left femoral neck. There is no dislocation. The bones are osteopenic. Degenerative changes of the hips and SI joints. IMPRESSION: Nondisplaced fracture of the left femoral neck. Electronically Signed   By: Anner Crete M.D.   On: 05/01/2019 22:38   Ct Head Wo Contrast  Result Date: 05/01/2019 CLINICAL DATA:  Fall EXAM: CT HEAD WITHOUT CONTRAST CT CERVICAL SPINE WITHOUT CONTRAST TECHNIQUE: Multidetector CT imaging of the head and cervical spine was performed following the standard protocol without intravenous contrast. Multiplanar CT image reconstructions of the cervical spine were also generated. COMPARISON:  None. FINDINGS: CT HEAD FINDINGS Brain: There is no mass, hemorrhage or extra-axial collection. There is generalized atrophy without lobar predilection. There is hypoattenuation of the periventricular white matter, most commonly indicating chronic ischemic microangiopathy. Vascular: No abnormal hyperdensity of  the major intracranial arteries or dural venous sinuses. No intracranial atherosclerosis. Skull: The visualized skull base, calvarium and extracranial soft tissues are normal. Sinuses/Orbits: There are secretions within both maxillary sinuses. The right frontal sinus is opacified. Partial opacification of the ethmoid air cells. No mastoid or middle ear effusion. The orbits are normal. CT CERVICAL SPINE FINDINGS Alignment: There is grade 1 anterolisthesis at C5-6, likely due to facet arthrosis. Skull base  and vertebrae: There is no fracture. Soft tissues and spinal canal: No prevertebral fluid or swelling. No visible canal hematoma. Disc levels: There is severe multilevel facet arthrosis. The right facets are fused at C2-C4. Disc degeneration is greatest at C5-7. No bony spinal canal stenosis. Upper chest: Biapical emphysema. Other: Normal visualized paraspinal cervical soft tissues. IMPRESSION: 1. Chronic ischemic microangiopathy and generalized atrophy without acute intracranial abnormality. 2. No acute fracture of the cervical spine. Electronically Signed   By: Ulyses Jarred M.D.   On: 05/01/2019 23:37   Ct Cervical Spine Wo Contrast  Result Date: 05/01/2019 CLINICAL DATA:  Fall EXAM: CT HEAD WITHOUT CONTRAST CT CERVICAL SPINE WITHOUT CONTRAST TECHNIQUE: Multidetector CT imaging of the head and cervical spine was performed following the standard protocol without intravenous contrast. Multiplanar CT image reconstructions of the cervical spine were also generated. COMPARISON:  None. FINDINGS: CT HEAD FINDINGS Brain: There is no mass, hemorrhage or extra-axial collection. There is generalized atrophy without lobar predilection. There is hypoattenuation of the periventricular white matter, most commonly indicating chronic ischemic microangiopathy. Vascular: No abnormal hyperdensity of the major intracranial arteries or dural venous sinuses. No intracranial atherosclerosis. Skull: The visualized skull base,  calvarium and extracranial soft tissues are normal. Sinuses/Orbits: There are secretions within both maxillary sinuses. The right frontal sinus is opacified. Partial opacification of the ethmoid air cells. No mastoid or middle ear effusion. The orbits are normal. CT CERVICAL SPINE FINDINGS Alignment: There is grade 1 anterolisthesis at C5-6, likely due to facet arthrosis. Skull base and vertebrae: There is no fracture. Soft tissues and spinal canal: No prevertebral fluid or swelling. No visible canal hematoma. Disc levels: There is severe multilevel facet arthrosis. The right facets are fused at C2-C4. Disc degeneration is greatest at C5-7. No bony spinal canal stenosis. Upper chest: Biapical emphysema. Other: Normal visualized paraspinal cervical soft tissues. IMPRESSION: 1. Chronic ischemic microangiopathy and generalized atrophy without acute intracranial abnormality. 2. No acute fracture of the cervical spine. Electronically Signed   By: Ulyses Jarred M.D.   On: 05/01/2019 23:37   Pelvis Portable  Result Date: 05/02/2019 CLINICAL DATA:  Post left hip arthroplasty. EXAM: PORTABLE PELVIS 1-2 VIEWS COMPARISON:  05/01/2019 FINDINGS: Mild diffuse osteopenia. Evidence of patient's recent left hip arthroplasty which is intact and normally located. Skin staples over the lateral soft tissues of the left hip. Remainder of the exam is unchanged. IMPRESSION: Expected changes post left hip arthroplasty. Electronically Signed   By: Marin Olp M.D.   On: 05/02/2019 16:44   Dg Femur Min 2 Views Left  Result Date: 05/01/2019 CLINICAL DATA:  85 year old female with fall and left lower extremity pain. EXAM: PELVIS - 1-2 VIEW; LEFT FEMUR 2 VIEWS COMPARISON:  CT of the abdomen pelvis dated 02/08/2019 FINDINGS: Evaluation is limited due to positioning of the hips. There is a nondisplaced fracture of the left femoral neck. There is no dislocation. The bones are osteopenic. Degenerative changes of the hips and SI joints.  IMPRESSION: Nondisplaced fracture of the left femoral neck. Electronically Signed   By: Anner Crete M.D.   On: 05/01/2019 22:38    Microbiology: Recent Results (from the past 240 hour(s))  SARS Coronavirus 2 [facility] order, Performed in [facility] hospital lab) Nasopharyngeal Nasopharyngeal Swab     Status: None   Collection Time: 05/01/19 10:47 PM   Specimen: Nasopharyngeal Swab  Result Value Ref Range Status   SARS Coronavirus 2 NEGATIVE NEGATIVE Final    Comment: (NOTE) If result is NEGATIVE SARS-CoV-2  target nucleic acids are NOT DETECTED. The SARS-CoV-2 RNA is generally detectable in upper and lower  respiratory specimens during the acute phase of infection. The lowest  concentration of SARS-CoV-2 viral copies this assay can detect is 250  copies / mL. A negative result does not preclude SARS-CoV-2 infection  and should not be used as the sole basis for treatment or other  patient management decisions.  A negative result may occur with  improper specimen collection / handling, submission of specimen other  than nasopharyngeal swab, presence of viral mutation(s) within the  areas targeted by this assay, and inadequate number of viral copies  (<250 copies / mL). A negative result must be combined with clinical  observations, patient history, and epidemiological information. If result is POSITIVE SARS-CoV-2 target nucleic acids are DETECTED. The SARS-CoV-2 RNA is generally detectable in upper and lower  respiratory specimens dur ing the acute phase of infection.  Positive  results are indicative of active infection with SARS-CoV-2.  Clinical  correlation with patient history and other diagnostic information is  necessary to determine patient infection status.  Positive results do  not rule out bacterial infection or co-infection with other viruses. If result is PRESUMPTIVE POSTIVE SARS-CoV-2 nucleic acids MAY BE PRESENT.   A presumptive positive result was obtained on the  submitted specimen  and confirmed on repeat testing.  While 2019 novel coronavirus  (SARS-CoV-2) nucleic acids may be present in the submitted sample  additional confirmatory testing may be necessary for epidemiological  and / or clinical management purposes  to differentiate between  SARS-CoV-2 and other Sarbecovirus currently known to infect humans.  If clinically indicated additional testing with an alternate test  methodology [phone]) is advised. The SARS-CoV-2 RNA is generally  detectable in upper and lower respiratory sp ecimens during the acute  phase of infection. The expected result is Negative. Fact Sheet for Patients:  StrictlyIdeas.no Fact Sheet for Healthcare Providers: BankingDealers.co.za This test is not yet approved or cleared by the [place] FDA and has been authorized for detection and/or diagnosis of SARS-CoV-2 by FDA under an Emergency Use Authorization (EUA).  This EUA will remain in effect (meaning this test can be used) for the duration of the COVID-19 declaration under Section 564(b)(1) of the Act, 21 U.S.C. section 360bbb-3(b)(1), unless the authorization is terminated or revoked sooner. Performed at [facility], [place] [address], [place], [place] [address]   Surgical PCR screen     Status: None   Collection Time: 05/02/19  2:53 AM   Specimen: Nasal Mucosa; Nasal Swab  Result Value Ref Range Status   MRSA, PCR NEGATIVE NEGATIVE Final   Staphylococcus aureus NEGATIVE NEGATIVE Final    Comment: (NOTE) The Xpert SA Assay (FDA approved for NASAL specimens in patients 51 years of age and older), is one component of a comprehensive surveillance program. It is not intended to diagnose infection nor to guide or monitor treatment. Performed at [facility], [place] [address], [place], [place] [address]   SARS CORONAVIRUS 2 (TAT 6-24 HRS) Nasopharyngeal Nasopharyngeal Swab      Status: None   Collection Time: 05/04/19 11:57 AM   Specimen: Nasopharyngeal Swab  Result Value Ref Range Status   SARS Coronavirus 2 NEGATIVE NEGATIVE Final    Comment: (NOTE) SARS-CoV-2 target nucleic acids are NOT DETECTED. The SARS-CoV-2 RNA is generally detectable in upper and lower respiratory specimens during the acute phase of infection. Negative results do not preclude SARS-CoV-2 infection, do not rule  out co-infections with other pathogens, and should not be used as the sole basis for treatment or other patient management decisions. Negative results must be combined with clinical observations, patient history, and epidemiological information. The expected result is Negative. Fact Sheet for Patients: SugarRoll.be Fact Sheet for Healthcare Providers: https://www.woods-mathews.com/ This test is not yet approved or cleared by the [place] FDA and  has been authorized for detection and/or diagnosis of SARS-CoV-2 by FDA under an Emergency Use Authorization (EUA). This EUA will remain  in effect (meaning this test can be used) for the duration of the COVID-19 declaration under Section 56 4(b)(1) of the Act, 21 U.S.C. section 360bbb-3(b)(1), unless the authorization is terminated or revoked sooner. Performed at [facility], [place] [address] [place], [place] [address]      Labs: CBC: Recent Labs  Lab 05/01/19 2324 05/02/19 0338 05/03/19 0429 05/04/19 0505 05/05/19 0358  WBC 11.3* 13.9* 8.6 8.1 8.2  HGB 12.7 13.3 9.4* 8.0* 7.8*  HCT 39.0 40.9 28.8* 24.6* 24.2*  MCV 95.6 95.8 96.3 97.2 96.8  PLT 215 212 151 132* [id number]   Basic Metabolic Panel: Recent Labs  Lab 05/01/19 2324 05/02/19 0338 05/03/19 0429 05/04/19 0505  NA 139 140 134* 136  K 5.3* 4.4 4.4 4.6  CL 102 104 100 100  CO2 26 28 24 29   GLUCOSE 112* 139* 132* 93  BUN 19 19 19 19   CREATININE 0.74 0.66 0.67 0.66  CALCIUM 9.2 9.7 8.5* 8.5*   Liver  Function Tests: Recent Labs  Lab 05/02/19 0338  AST 25  ALT 18  ALKPHOS 59  BILITOT 0.8  PROT 7.9  ALBUMIN 4.2   No results for input(s): LIPASE, AMYLASE in the last 168 hours. No results for input(s): AMMONIA in the last 168 hours. Cardiac Enzymes: No results for input(s): CKTOTAL, CKMB, CKMBINDEX, TROPONINI in the last 168 hours. BNP (last 3 results) No results for input(s): BNP in the last 8760 hours. CBG: No results for input(s): GLUCAP in the last 168 hours.  Time spent: 35 minutes  Signed:  Berle Mull  [facility]  05/05/2019 10:50 AM

## 2019-05-05 NOTE — Progress Notes (Signed)
   Subjective: 3 Days Post-Op Procedure(s) (LRB): ARTHROPLASTY BIPOLAR HIP (HEMIARTHROPLASTY) (Left) Patient reports pain as mild.    Objective: Vital signs in last 24 hours: Temp:  [98.2 F (36.8 C)] 98.2 F (36.8 C) (09/15 0546) Pulse Rate:  [64-83] 64 (09/15 0546) Resp:  [16-20] 16 (09/15 0546) BP: (86-129)/(48-53) 102/53 (09/15 0546) SpO2:  [82 %-99 %] 95 % (09/15 0546)  Intake/Output from previous day: 09/14 0701 - 09/15 0700 In: 1560 [P.O.:1560] Out: 1250 [Urine:1250] Intake/Output this shift: No intake/output data recorded.  Recent Labs    05/03/19 0429 05/04/19 0505 05/05/19 0358  HGB 9.4* 8.0* 7.8*   Recent Labs    05/04/19 0505 05/05/19 0358  WBC 8.1 8.2  RBC 2.53* 2.50*  HCT 24.6* 24.2*  PLT 132* 167   Recent Labs    05/03/19 0429 05/04/19 0505  NA 134* 136  K 4.4 4.6  CL 100 100  CO2 24 29  BUN 19 19  CREATININE 0.67 0.66  GLUCOSE 132* 93  CALCIUM 8.5* 8.5*   No results for input(s): LABPT, INR in the last 72 hours.  Neurologically intact No results found.  Assessment/Plan: 3 Days Post-Op Procedure(s) (LRB): ARTHROPLASTY BIPOLAR HIP (HEMIARTHROPLASTY) (Left) Up with therapy, back to SNF. Rx on chart. Bayer aspirin one daily for DVT prophylaxis,  WBAT.   Rx norco on chart. Follow up 2 wks with Benjiman Core PA-C in my office.    [name] 05/05/2019, 7:34 AM

## 2019-05-05 NOTE — Progress Notes (Signed)
Provider:  Veleta Miners  MD Location:  [facility] Room Number: [id number] [facility] Place of Service:  SNF (31)  PCP: Deland Pretty, MD Patient Care Team: Deland Pretty, MD as PCP - General (Internal Medicine) Troy Sine, MD as PCP - Cardiology (Cardiology) Troy Sine, MD as Consulting Physician (Cardiology)  Extended Emergency Contact Information Primary Emergency Contact: Burley Saver, [place] [address] Johnnette Litter of [place] Phone: [phone] Relation: Son Secondary Emergency Contact: Dierdre Forth, [place] [address] [place] of [employer] Phone: [phone] Relation: Relative  Code Status: Full Code Goals of Care: Advanced Directive information Advanced Directives 05/02/2019  Does Patient Have a Medical Advance Directive? Yes  Type of Paramedic of [place];Living will  Does patient want to make changes to medical advance directive? Yes (Inpatient - patient defers changing a medical advance directive at this time - Information given)  Copy of Reform in Chart? No - copy requested  Would patient like information on creating a medical advance directive? -      Chief Complaint  Patient presents with   New Admit To SNF    HPI: Patient is a 85 y.o. female seen today for admission to SNF for Therapy  Principal patient was admitted in the hospital from 9 /11-9/ 15 for left displaced femoral neck fracture.  She underwent left hip monopolar hemiarthroplasty on 9/12  Patient has a history of hypertension, hyperlipidemia,  CAD, history of atrial fibrillation controlled with amiodarone not on any anticoagulation due to risk of falls and GI bleed, also has history of GI bleed with anemia, patient also has valvular heart disease with severe MR and TR and Aortic Stenosis.  With elevated right ventricular systolic pressure, history of peripheral neuropathy with gait  abnormality, history of chronic right foot wound  Patient states that she fell in her apartment as she lost footing while turning the lights in the kitchen.  She was found to have a left femoral neck fracture.  She underwent left hip hemiarthroplasty.  She tolerated the procedure well did not have any postop complications. She did have some drop in her hemoglobin Her echo done in the hospital which showed preserved EF of more than 60%  When patient came to the facility said only she dropped her oxygen and her blood pressure was also low.  Patient denied any shortness of breath.  She did complain of some dizziness.  She was put in the bed and her legs were elevated and she was put on oxygen and eventually her blood pressure came back and she started feeling better.  She denied any chest pain or coughing or fever Patient lives by herself in [place] has a very supportive son  She states .she uses a motorized chair for long distances due to shortness of breath and weakness.  Quit driving 3 years ago   Past Medical History:  Diagnosis Date   Abnormality of gait 09/07/2015   Anemia, iron deficiency 09/16/2014   Anxiety    Arthritis    "hands and feet" (12/06/2017)   CAD (coronary artery disease)    a. 08/2014 NSTEMI/Cath: mild Ca2+ or RCA ostium, otw nl cors. CO 3.3 L/min (thermo), 3.7 L/min (Fick).   Carotid arterial disease ([facility]) 07/30/2012   carotid doppler; normal study   CHF (congestive heart failure) (HCC)    Chronic anticoagulation, with coumadin, with  PAF and CHADS2Vasc2 score of 4 09/16/2014   Chronic back pain    "all over" (0000000)   Complication of anesthesia    "they had hard time waking me up" (12/06/2017)   Degenerative arthritis    Diverticulosis    Essential hypertension    GERD (gastroesophageal reflux disease)    History of blood transfusion 12/06/2017   History of hiatal hernia    History of nuclear stress test 09/19/2007   normal pattern of perfusion;  post-stress EF 86%; EKG negative for ischemia; low risk scan   Hyperlipidemia    Left carotid bruit    a. 07/2015 Carotid U/S: 1-39% bilat ICA stenosis.   Memory disorder 03/05/2014   Mild renal insufficiency    Mitral prolapse    Neuropathy    "hands and feet" (12/06/2017)   PAF (paroxysmal atrial fibrillation) ([facility])    a. 08/2014-->Coumadin (CHA2DS2VASc = 4).   Peripheral neuropathy    Small fiber    Pre-syncope    a. 04/2015 in setting of bradycardia-->CCB/BB doses adjusted.   Pulmonary hypertension (HCC)    Seasonal allergies    Skin cancer    "burned off my face" (12/06/2017)   Valvular heart disease    a. 04/2015 Echo: EF 65-70%, no rwma, Gr1 DD, mild AS, triv AI, mild MS, mod MR, mod dil LA, mod TR, sev increased PASP.   Past Surgical History:  Procedure Laterality Date   ABDOMINAL HYSTERECTOMY     APPENDECTOMY     BUNIONECTOMY Bilateral    CARPAL TUNNEL RELEASE Left    CHOLECYSTECTOMY OPEN     DILATION AND CURETTAGE OF UTERUS     ESOPHAGOGASTRODUODENOSCOPY N/A 12/10/2017   Procedure: ESOPHAGOGASTRODUODENOSCOPY (EGD);  Surgeon: Clarene Essex, MD;  Location: [facility];  Service: Endoscopy;  Laterality: N/A;   FERTILITY SURGERY     resuspension procedure    FOOT FRACTURE SURGERY Left    FRACTURE SURGERY     HIP ARTHROPLASTY Left 05/02/2019   Procedure: ARTHROPLASTY BIPOLAR HIP (HEMIARTHROPLASTY);  Surgeon: Marybelle Killings, MD;  Location: [facility];  Service: Orthopedics;  Laterality: Left;   JOINT REPLACEMENT     LEFT HEART CATHETERIZATION WITH CORONARY ANGIOGRAM N/A 09/14/2014   Procedure: LEFT HEART CATHETERIZATION WITH CORONARY ANGIOGRAM;  Surgeon: Troy Sine, MD;  Location: [facility] CATH LAB;  Service: Cardiovascular;  Laterality: N/A;   REPLACEMENT TOTAL KNEE Right 03/2008   Archie Endo 12/19/2010   ROBOTIC ASSISTED BILATERAL SALPINGO OOPHERECTOMY Bilateral 02/19/2017   Procedure: XI ROBOTIC ASSISTED BILATERAL SALPINGO OOPHORECTOMY AND LYSIS OF ADHESION;   Surgeon: Everitt Amber, MD;  Location: [facility];  Service: Gynecology;  Laterality: Bilateral;   TEAR DUCT PROBING Left    TONSILLECTOMY     UMBILICAL HERNIA REPAIR      reports that she has quit smoking. Her smoking use included cigarettes. She has never used smokeless tobacco. She reports that she does not drink alcohol or use drugs. Social History   Socioeconomic History   Marital status: Widowed    Spouse name: Not on file   Number of children: 2   Years of education: AS   Highest education level: Not on file  Occupational History   Occupation: Retired  Scientist, product/process development strain: Not on file   Food insecurity    Worry: Not on file    Inability: Not on file   Transportation needs    Medical: Not on file    Non-medical: Not on file  Tobacco Use   Smoking  status: Former Smoker    Types: Cigarettes   Smokeless tobacco: Never Used   Tobacco comment: "quit smoking ~ 1948  Substance and Sexual Activity   Alcohol use: No   Drug use: No   Sexual activity: Not Currently  Lifestyle   Physical activity    Days per week: Not on file    Minutes per session: Not on file   Stress: Not on file  Relationships   Social connections    Talks on phone: Not on file    Gets together: Not on file    Attends religious service: Not on file    Active member of club or organization: Not on file    Attends meetings of clubs or organizations: Not on file    Relationship status: Not on file   Intimate partner violence    Fear of current or ex partner: Not on file    Emotionally abused: Not on file    Physically abused: Not on file    Forced sexual activity: Not on file  Other Topics Concern   Not on file  Social History Narrative   Res[facility] at [facility]   Patient is left handed, but uses right handed.   Patient drinks one cup caffeine daily.    Functional Status Survey:    Family History  Problem Relation Age of Onset   Pneumonia Mother      Coronary artery disease Mother    Hypertension Mother    Heart disease Father    Cancer Father    Hypertension Father    Arthritis Sister     Health Maintenance  Topic Date Due   TETANUS/TDAP  05/17/1943   DEXA SCAN  05/16/1989   PNA vac Low Risk Adult (1 of 2 - PCV13) 05/16/1989   INFLUENZA VACCINE  03/21/2019    Allergies  Allergen Reactions   Avelox [Moxifloxacin Hcl In [place]] Shortness Of Breath and Swelling   Moxifloxacin Other (See Comments), Shortness Of Breath and Swelling    other   Aricept [Donepezil Hcl] Diarrhea   Codeine Swelling        Donepezil Diarrhea   Garlic Diarrhea   Latex Hives, Itching, Rash and Other (See Comments)    Watery blisters    Onion Diarrhea   Sulfa Drugs Cross Reactors Swelling   Duloxetine Rash   Polysporin [Bacitracin-Polymyxin B] Other (See Comments)    other    Outpatient Encounter Medications as of 05/05/2019  Medication Sig   acetaminophen (TYLENOL) 500 MG tablet Take 500 mg by mouth every 6 (six) hours.    amiodarone (PACERONE) 100 MG tablet Take 1 tablet (100 mg total) by mouth daily.   aspirin (BAYER ASPIRIN) 325 MG tablet Take 1 tablet (325 mg total) by mouth daily.   atorvastatin (LIPITOR) 10 MG tablet Take 5 mg by mouth daily.    calcium carbonate (OS-CAL) 1250 (500 Ca) MG chewable tablet Chew 1 tablet by mouth daily.   cetirizine (ZYRTEC) 10 MG tablet Take 10 mg by mouth daily with breakfast.    cholecalciferol (VITAMIN D) 1000 UNITS tablet Take 2,000 Units by mouth daily.    famotidine (PEPCID) 20 MG tablet Take 20 mg by mouth daily.    [DISCONTINUED] docusate sodium (COLACE) 100 MG capsule Take 1 capsule (100 mg total) by mouth daily.   [DISCONTINUED] Emollient (AVEENO ADVANCED CARE EX) Apply 1 application topically 2 (two) times daily as needed (dry skin (on feet and after showering)).    [DISCONTINUED] ENSURE PLUS (  ENSURE PLUS) LIQD Take 237 mLs by mouth daily. At lunch    [DISCONTINUED] fluticasone (FLONASE) 50 MCG/ACT nasal spray Place 1 spray into both nostrils daily.    [DISCONTINUED] gabapentin (NEURONTIN) 300 MG capsule TAKE 1 CAPSULE THREE TIMES DAILY AND 3 CAPSULES AT BEDTIME (Patient taking differently: Take 300-900 mg by mouth See admin instructions. 300 mg three a day and 900 mg at bedtime)   [DISCONTINUED] HYDROcodone-acetaminophen (NORCO) 5-325 MG tablet Take 1 tablet by mouth every 4 (four) hours as needed for moderate pain.   [DISCONTINUED] levETIRAcetam (KEPPRA) 250 MG tablet 1/2 tablet in the morning and 2 in the evening (Patient taking differently: Take 250-500 mg by mouth See admin instructions. 250 mg tablet in the morning and 500 mg in the evening)   [DISCONTINUED] memantine (NAMENDA) 10 MG tablet TAKE 1 TABLET TWICE DAILY (Patient taking differently: Take 10 mg by mouth 2 (two) times daily. )   [DISCONTINUED] mirtazapine (REMERON) 7.5 MG tablet Take 7.5 mg by mouth at bedtime.    [DISCONTINUED] Multiple Vitamin (MULTIVITAMIN WITH MINERALS) TABS tablet Take 1 tablet by mouth daily.   [DISCONTINUED] pantoprazole (PROTONIX) 40 MG tablet Take 40 mg by mouth daily.    [DISCONTINUED] vitamin B-12 (CYANOCOBALAMIN) 1000 MCG tablet Take 1,000 mcg by mouth daily.   [DISCONTINUED] acetaminophen (TYLENOL) tablet 325-650 mg    [DISCONTINUED] amiodarone (PACERONE) tablet 100 mg    [DISCONTINUED] atorvastatin (LIPITOR) tablet 5 mg    [DISCONTINUED] docusate sodium (COLACE) capsule 100 mg    [DISCONTINUED] enoxaparin (LOVENOX) injection 40 mg    [DISCONTINUED] famotidine (PEPCID) tablet 20 mg    [DISCONTINUED] feeding supplement (ENSURE ENLIVE) (ENSURE ENLIVE) liquid 237 mL    [DISCONTINUED] fluticasone (FLONASE) 50 MCG/ACT nasal spray 1 spray    [DISCONTINUED] gabapentin (NEURONTIN) capsule 300 mg    [DISCONTINUED] gabapentin (NEURONTIN) capsule 900 mg    [DISCONTINUED] HYDROcodone-acetaminophen (NORCO) 7.5-325 MG per tablet 1-2 tablet     [DISCONTINUED] HYDROcodone-acetaminophen (NORCO/VICODIN) 5-325 MG per tablet 1-2 tablet    [DISCONTINUED] levETIRAcetam (KEPPRA) tablet 250 mg    [DISCONTINUED] levETIRAcetam (KEPPRA) tablet 500 mg    [DISCONTINUED] memantine (NAMENDA) tablet 10 mg    [DISCONTINUED] menthol-cetylpyridinium (CEPACOL) lozenge 3 mg    [DISCONTINUED] mirtazapine (REMERON) tablet 7.5 mg    [DISCONTINUED] morphine 2 MG/ML injection 0.5-1 mg    [DISCONTINUED] multivitamin with minerals tablet 1 tablet    [DISCONTINUED] ondansetron (ZOFRAN) injection 4 mg    [DISCONTINUED] ondansetron (ZOFRAN) tablet 4 mg    [DISCONTINUED] phenol (CHLORASEPTIC) mouth spray 1 spray    No facility-administered encounter medications on file as of 05/05/2019.     Review of Systems  Constitutional: Positive for activity change.  HENT: Negative.   Respiratory: Positive for shortness of breath.   Cardiovascular: Negative.   Gastrointestinal: Negative.   Genitourinary: Negative.   Musculoskeletal: Positive for arthralgias.  Neurological: Positive for dizziness and weakness.  Psychiatric/Behavioral: Negative.   All other systems reviewed and are negative.   Vitals:   05/05/19 1917  BP: 120/60  Pulse: 75  Resp: (!) 22  Temp: 98.4 F (36.9 C)  SpO2: 94%   There is no height or weight on file to calculate BMI. Physical Exam Vitals signs reviewed.  HENT:     Head: Normocephalic.     Nose: Nose normal.     Mouth/Throat:     Mouth: Mucous membranes are moist.     Pharynx: Oropharynx is clear.  Eyes:     Pupils: Pupils are equal, round,  and reactive to light.  Neck:     Musculoskeletal: Neck supple.  Cardiovascular:     Rate and Rhythm: Normal rate and regular rhythm.     Pulses: Normal pulses.     Heart sounds: Murmur present.  Pulmonary:     Effort: Pulmonary effort is normal. No respiratory distress.     Breath sounds: Normal breath sounds. No wheezing.  Abdominal:     General: Abdomen is flat. Bowel  sounds are normal. There is no distension.     Palpations: Abdomen is soft.     Tenderness: There is no abdominal tenderness. There is no guarding.  Musculoskeletal:     Comments: Mild Edema in Left LE with Calf Pain  Skin:    General: Skin is warm and dry.  Neurological:     General: No focal deficit present.     Mental Status: She is alert and oriented to person, place, and time.  Psychiatric:        Mood and Affect: Mood normal.        Thought Content: Thought content normal.        Judgment: Judgment normal.     Labs reviewed: Basic Metabolic Panel: Recent Labs    05/02/19 0338 05/03/19 0429 05/04/19 0505  NA 140 134* 136  K 4.4 4.4 4.6  CL 104 100 100  CO2 28 24 29   GLUCOSE 139* 132* 93  BUN 19 19 19   CREATININE 0.66 0.67 0.66  CALCIUM 9.7 8.5* 8.5*   Liver Function Tests: Recent Labs    01/26/19 1459 02/08/19 1940 05/02/19 0338  AST 24 25 25   ALT 15 16 18   ALKPHOS 65 67 59  BILITOT 0.3 0.7 0.8  PROT 7.0 7.4 7.9  ALBUMIN 4.5 4.0 4.2   Recent Labs    02/08/19 1940  LIPASE 25   No results for input(s): AMMONIA in the last 8760 hours. CBC: Recent Labs    02/08/19 1940  05/03/19 0429 05/04/19 0505 05/05/19 0358  WBC 5.5   < > 8.6 8.1 8.2  NEUTROABS 4.0  --   --   --   --   HGB 13.9   < > 9.4* 8.0* 7.8*  HCT 42.0   < > 28.8* 24.6* 24.2*  MCV 94.2   < > 96.3 97.2 96.8  PLT 219   < > 151 132* 167   < > = values in this interval not displayed.   Cardiac Enzymes: No results for input(s): CKTOTAL, CKMB, CKMBINDEX, TROPONINI in the last 8760 hours. BNP: Invalid input(s): POCBNP Lab Results  Component Value Date   HGBA1C 5.6 09/13/2014   Lab Results  Component Value Date   TSH 3.180 01/26/2019   Lab Results  Component Value Date   VITAMINB12 >1999 (H) 03/05/2014   No results found for: FOLATE Lab Results  Component Value Date   IRON 14 (L) 12/13/2017   TIBC 349 12/13/2017   FERRITIN 22 12/13/2017    Imaging and Procedures obtained  prior to SNF admission: Dg Chest 1 View  Result Date: 05/01/2019 CLINICAL DATA:  85 year female with left hip fracture. Preop evaluation. EXAM: CHEST  1 VIEW COMPARISON:  Chest radiograph dated 12/17/2017 FINDINGS: No focal consolidation, pleural effusion, pneumothorax. Borderline cardiomegaly. Calcification of the mitral annulus as well as atherosclerotic calcification of the aorta. No acute osseous pathology. Degenerative changes of spine. IMPRESSION: No active disease. Electronically Signed   By: Anner Crete M.D.   On: 05/01/2019 22:41   Dg  Pelvis 1-2 Views  Result Date: 05/01/2019 CLINICAL DATA:  85 year old female with fall and left lower extremity pain. EXAM: PELVIS - 1-2 VIEW; LEFT FEMUR 2 VIEWS COMPARISON:  CT of the abdomen pelvis dated 02/08/2019 FINDINGS: Evaluation is limited due to positioning of the hips. There is a nondisplaced fracture of the left femoral neck. There is no dislocation. The bones are osteopenic. Degenerative changes of the hips and SI joints. IMPRESSION: Nondisplaced fracture of the left femoral neck. Electronically Signed   By: Anner Crete M.D.   On: 05/01/2019 22:38   Ct Head Wo Contrast  Result Date: 05/01/2019 CLINICAL DATA:  Fall EXAM: CT HEAD WITHOUT CONTRAST CT CERVICAL SPINE WITHOUT CONTRAST TECHNIQUE: Multidetector CT imaging of the head and cervical spine was performed following the standard protocol without intravenous contrast. Multiplanar CT image reconstructions of the cervical spine were also generated. COMPARISON:  None. FINDINGS: CT HEAD FINDINGS Brain: There is no mass, hemorrhage or extra-axial collection. There is generalized atrophy without lobar predilection. There is hypoattenuation of the periventricular white matter, most commonly indicating chronic ischemic microangiopathy. Vascular: No abnormal hyperdensity of the major intracranial arteries or dural venous sinuses. No intracranial atherosclerosis. Skull: The visualized skull  base, calvarium and extracranial soft tissues are normal. Sinuses/Orbits: There are secretions within both maxillary sinuses. The right frontal sinus is opacified. Partial opacification of the ethmoid air cells. No mastoid or middle ear effusion. The orbits are normal. CT CERVICAL SPINE FINDINGS Alignment: There is grade 1 anterolisthesis at C5-6, likely due to facet arthrosis. Skull base and vertebrae: There is no fracture. Soft tissues and spinal canal: No prevertebral fluid or swelling. No visible canal hematoma. Disc levels: There is severe multilevel facet arthrosis. The right facets are fused at C2-C4. Disc degeneration is greatest at C5-7. No bony spinal canal stenosis. Upper chest: Biapical emphysema. Other: Normal visualized paraspinal cervical soft tissues. IMPRESSION: 1. Chronic ischemic microangiopathy and generalized atrophy without acute intracranial abnormality. 2. No acute fracture of the cervical spine. Electronically Signed   By: Ulyses Jarred M.D.   On: 05/01/2019 23:37   Ct Cervical Spine Wo Contrast  Result Date: 05/01/2019 CLINICAL DATA:  Fall EXAM: CT HEAD WITHOUT CONTRAST CT CERVICAL SPINE WITHOUT CONTRAST TECHNIQUE: Multidetector CT imaging of the head and cervical spine was performed following the standard protocol without intravenous contrast. Multiplanar CT image reconstructions of the cervical spine were also generated. COMPARISON:  None. FINDINGS: CT HEAD FINDINGS Brain: There is no mass, hemorrhage or extra-axial collection. There is generalized atrophy without lobar predilection. There is hypoattenuation of the periventricular white matter, most commonly indicating chronic ischemic microangiopathy. Vascular: No abnormal hyperdensity of the major intracranial arteries or dural venous sinuses. No intracranial atherosclerosis. Skull: The visualized skull base, calvarium and extracranial soft tissues are normal. Sinuses/Orbits: There are secretions within both maxillary sinuses. The  right frontal sinus is opacified. Partial opacification of the ethmoid air cells. No mastoid or middle ear effusion. The orbits are normal. CT CERVICAL SPINE FINDINGS Alignment: There is grade 1 anterolisthesis at C5-6, likely due to facet arthrosis. Skull base and vertebrae: There is no fracture. Soft tissues and spinal canal: No prevertebral fluid or swelling. No visible canal hematoma. Disc levels: There is severe multilevel facet arthrosis. The right facets are fused at C2-C4. Disc degeneration is greatest at C5-7. No bony spinal canal stenosis. Upper chest: Biapical emphysema. Other: Normal visualized paraspinal cervical soft tissues. IMPRESSION: 1. Chronic ischemic microangiopathy and generalized atrophy without acute intracranial abnormality. 2. No acute fracture  of the cervical spine. Electronically Signed   By: Ulyses Jarred M.D.   On: 05/01/2019 23:37   Pelvis Portable  Result Date: 05/02/2019 CLINICAL DATA:  Post left hip arthroplasty. EXAM: PORTABLE PELVIS 1-2 VIEWS COMPARISON:  05/01/2019 FINDINGS: Mild diffuse osteopenia. Evidence of patient's recent left hip arthroplasty which is intact and normally located. Skin staples over the lateral soft tissues of the left hip. Remainder of the exam is unchanged. IMPRESSION: Expected changes post left hip arthroplasty. Electronically Signed   By: Marin Olp M.D.   On: 05/02/2019 16:44   Dg Femur Min 2 Views Left  Result Date: 05/01/2019 CLINICAL DATA:  85 year old female with fall and left lower extremity pain. EXAM: PELVIS - 1-2 VIEW; LEFT FEMUR 2 VIEWS COMPARISON:  CT of the abdomen pelvis dated 02/08/2019 FINDINGS: Evaluation is limited due to positioning of the hips. There is a nondisplaced fracture of the left femoral neck. There is no dislocation. The bones are osteopenic. Degenerative changes of the hips and SI joints. IMPRESSION: Nondisplaced fracture of the left femoral neck. Electronically Signed   By: Anner Crete M.D.   On: 05/01/2019  22:38    Assessment/Plan Hypoxia Patient patient came to the facility she was hypoxic needing oxygen She eventually did come down and her oxygen came back on 2 L We will order chest x-ray Her CBC done this morning showed a hemoglobin of 7.8 and white count was normal Her hypoxia is most likely due to her heart disease and anxiety We will also do a Doppler of her left leg to rule out DVT Vital signs every 4 hours Addendum Chest Xray was normal with no acute process S/p left hemiarthroplasty WBAT On Norco Prn for Pain Will work with therapy Follow up with Ortho Valvular heart disease with Pulmonary Hypertension Was taken off Norvasc in hospital Will continue on Oxygen for now H/o PAF Continue on Amiodarone Not on any Anticoagulation due to h/o GI bleed  Anemia Post op Will start on Iron Repeat CBC in 1 week H/o Neuropathy On Neurontin and Keppra  B12 def Continue on Viit B12  Depression with anxiety On Remeron Also on Namenda    Family/ staff Communication:   Labs/tests ordered:

## 2019-05-06 DIAGNOSIS — R52 Pain, unspecified: Secondary | ICD-10-CM | POA: Diagnosis not present

## 2019-05-07 ENCOUNTER — Encounter: Payer: Self-pay | Admitting: Internal Medicine

## 2019-05-07 ENCOUNTER — Non-Acute Institutional Stay (SKILLED_NURSING_FACILITY): Payer: Medicare Other | Admitting: Internal Medicine

## 2019-05-07 DIAGNOSIS — I38 Endocarditis, valve unspecified: Secondary | ICD-10-CM | POA: Diagnosis not present

## 2019-05-07 DIAGNOSIS — R0902 Hypoxemia: Secondary | ICD-10-CM | POA: Diagnosis not present

## 2019-05-07 DIAGNOSIS — I480 Paroxysmal atrial fibrillation: Secondary | ICD-10-CM

## 2019-05-07 DIAGNOSIS — Z96642 Presence of left artificial hip joint: Secondary | ICD-10-CM

## 2019-05-07 NOTE — Progress Notes (Signed)
Location: [facility] manager of Service:  SNF ([id number])  Provider:   Code Status:  Goals of Care:  Advanced Directives 05/02/2019  Does Patient Have a Medical Advance Directive? Yes  Type of Paramedic of [place];Living will  Does patient want to make changes to medical advance directive? Yes (Inpatient - patient defers changing a medical advance directive at this time - Information given)  Copy of [place] in Chart? No - copy requested  Would patient like information on creating a medical advance directive? -     Chief Complaint  Patient presents with   Acute Visit    HPI: Patient is a 85 y.o. female seen today for an acute visit for Follow up for Hypoxia and Confusion patient was admitted in the hospital from 9 /11-9/ 15 for left displaced femoral neck fracture.  She underwent left hip monopolar hemiarthroplasty on 9/12  Patient has a history of hypertension, hyperlipidemia,  CAD, history of atrial fibrillation controlled with amiodarone not on any anticoagulation due to risk of falls and GI bleed, also has history of GI bleed with anemia, patient also has valvular heart disease with severe MR and TR and Aortic Stenosis.  With elevated right ventricular systolic pressure, history of peripheral neuropathy with gait abnormality, history of chronic right foot wound Patient still independent.  Underwent left hip hemiarthroplasty.  Tolerated procedure well did not have any postop complication except for low hemoglobin. When she came to the facility patient became hypoxic.  Her blood pressure also dropped.  But then she was put on 2 L of Oxygen..  Blood pressure came up in the bed. Since then she is doing little better her chest x-ray was negative her Dopplers of lower extremities were negative She denies any shortness of breath but nurses state that her pulse ox drops to 86 on exertion.  No cough, chest pain or fever her Her only  complaint is weakness.  She is also complaining of constipation Nurses have noticed some confusion especially late in the evening   Past Medical History:  Diagnosis Date   Abnormality of gait 09/07/2015   Anemia, iron deficiency 09/16/2014   Anxiety    Arthritis    "hands and feet" (12/06/2017)   CAD (coronary artery disease)    a. 08/2014 NSTEMI/Cath: mild Ca2+ or RCA ostium, otw nl cors. CO 3.3 L/min (thermo), 3.7 L/min (Fick).   Carotid arterial disease ([place]) 07/30/2012   carotid doppler; normal study   CHF (congestive heart failure) (HCC)    Chronic anticoagulation, with coumadin, with PAF and CHADS2Vasc2 score of 4 09/16/2014   Chronic back pain    "all over" (0000000)   Complication of anesthesia    "they had hard time waking me up" (12/06/2017)   Degenerative arthritis    Diverticulosis    Essential hypertension    GERD (gastroesophageal reflux disease)    History of blood transfusion 12/06/2017   History of hiatal hernia    History of nuclear stress test 09/19/2007   normal pattern of perfusion; post-stress EF 86%; EKG negative for ischemia; low risk scan   Hyperlipidemia    Left carotid bruit    a. 07/2015 Carotid U/S: 1-39% bilat ICA stenosis.   Memory disorder 03/05/2014   Mild renal insufficiency    Mitral prolapse    Neuropathy    "hands and feet" (12/06/2017)   PAF (paroxysmal atrial fibrillation) ([place])    a. 08/2014-->Coumadin (CHA2DS2VASc = 4).  Peripheral neuropathy    Small fiber    Pre-syncope    a. 04/2015 in setting of bradycardia-->CCB/BB doses adjusted.   Pulmonary hypertension (HCC)    Seasonal allergies    Skin cancer    "burned off my face" (12/06/2017)   Valvular heart disease    a. 04/2015 Echo: EF 65-70%, no rwma, Gr1 DD, mild AS, triv AI, mild MS, mod MR, mod dil LA, mod TR, sev increased PASP.    Past Surgical History:  Procedure Laterality Date   ABDOMINAL HYSTERECTOMY     APPENDECTOMY     BUNIONECTOMY  Bilateral    CARPAL TUNNEL RELEASE Left    CHOLECYSTECTOMY OPEN     DILATION AND CURETTAGE OF UTERUS     ESOPHAGOGASTRODUODENOSCOPY N/A 12/10/2017   Procedure: ESOPHAGOGASTRODUODENOSCOPY (EGD);  Surgeon: Clarene Essex, MD;  Location: [facility];  Service: Endoscopy;  Laterality: N/A;   FERTILITY SURGERY     resuspension procedure    FOOT FRACTURE SURGERY Left    FRACTURE SURGERY     HIP ARTHROPLASTY Left 05/02/2019   Procedure: ARTHROPLASTY BIPOLAR HIP (HEMIARTHROPLASTY);  Surgeon: Marybelle Killings, MD;  Location: [facility];  Service: Orthopedics;  Laterality: Left;   JOINT REPLACEMENT     LEFT HEART CATHETERIZATION WITH CORONARY ANGIOGRAM N/A 09/14/2014   Procedure: LEFT HEART CATHETERIZATION WITH CORONARY ANGIOGRAM;  Surgeon: Troy Sine, MD;  Location: [facility] CATH LAB;  Service: Cardiovascular;  Laterality: N/A;   REPLACEMENT TOTAL KNEE Right 03/2008   Archie Endo 12/19/2010   ROBOTIC ASSISTED BILATERAL SALPINGO OOPHERECTOMY Bilateral 02/19/2017   Procedure: XI ROBOTIC ASSISTED BILATERAL SALPINGO OOPHORECTOMY AND LYSIS OF ADHESION;  Surgeon: Everitt Amber, MD;  Location: [facility];  Service: Gynecology;  Laterality: Bilateral;   TEAR DUCT PROBING Left    TONSILLECTOMY     UMBILICAL HERNIA REPAIR      Allergies  Allergen Reactions   Avelox [Moxifloxacin Hcl In Nacl] Shortness Of Breath and Swelling   Moxifloxacin Other (See Comments), Shortness Of Breath and Swelling    other   Aricept [Donepezil Hcl] Diarrhea   Codeine Swelling        Donepezil Diarrhea   Garlic Diarrhea   Latex Hives, Itching, Rash and Other (See Comments)    Watery blisters    Onion Diarrhea   Sulfa Drugs Cross Reactors Swelling   Duloxetine Rash   Polysporin [Bacitracin-Polymyxin B] Other (See Comments)    other    Outpatient Encounter Medications as of 05/07/2019  Medication Sig   [DISCONTINUED] levETIRAcetam (KEPPRA) 250 MG tablet Take 500 mg by mouth at bedtime.   acetaminophen (TYLENOL) 500  MG tablet Take 500 mg by mouth every 6 (six) hours.    amiodarone (PACERONE) 100 MG tablet Take 1 tablet (100 mg total) by mouth daily.   aspirin (BAYER ASPIRIN) 325 MG tablet Take 1 tablet (325 mg total) by mouth daily.   atorvastatin (LIPITOR) 10 MG tablet Take 5 mg by mouth daily.    calcium carbonate (OS-CAL) 1250 (500 Ca) MG chewable tablet Chew 1 tablet by mouth daily.   cetirizine (ZYRTEC) 10 MG tablet Take 10 mg by mouth daily with breakfast.    cholecalciferol (VITAMIN D) 1000 UNITS tablet Take 2,000 Units by mouth daily.    Cyanocobalamin (B-12) 1000 MCG TABS Take 1 tablet by mouth daily.   docusate sodium (COLACE) 100 MG capsule Take 100 mg by mouth daily.   famotidine (PEPCID) 20 MG tablet Take 20 mg by mouth daily.    ferrous sulfate 325 (  65 FE) MG tablet Take 325 mg by mouth daily.   fluticasone (FLONASE) 50 MCG/ACT nasal spray Place 1 spray into both nostrils daily.   gabapentin (NEURONTIN) 300 MG capsule Take 300 mg by mouth 3 (three) times daily.   gabapentin (NEURONTIN) 300 MG capsule Take 900 mg by mouth at bedtime.   HYDROcodone-acetaminophen (NORCO/VICODIN) 5-325 MG tablet Take 1 tablet by mouth every 4 (four) hours as needed for moderate pain.   levETIRAcetam (KEPPRA) 250 MG tablet Take 250 mg by mouth. Take half Tablet in the morning and 2 in the evening   memantine (NAMENDA) 10 MG tablet Take 10 mg by mouth 2 (two) times daily.   mirtazapine (REMERON) 7.5 MG tablet Take 7.5 mg by mouth at bedtime.   Multiple Vitamins-Minerals (MULTIVITAMIN WITH MINERALS) tablet Take 1 tablet by mouth daily.   pantoprazole (PROTONIX) 40 MG tablet Take 40 mg by mouth daily.   No facility-administered encounter medications on file as of 05/07/2019.     Review of Systems:  Review of Systems  Cardiovascular: Positive for leg swelling.  Gastrointestinal: Positive for constipation.  Neurological: Positive for weakness.  Psychiatric/Behavioral: Positive for confusion and  dysphoric mood.  All other systems reviewed and are negative.   Health Maintenance  Topic Date Due   TETANUS/TDAP  05/17/1941   DEXA SCAN  05/16/1989   PNA vac Low Risk Adult (1 of 2 - PCV13) 05/16/1989   INFLUENZA VACCINE  03/21/2019    Physical Exam: Vitals:   05/07/19 1555  BP: 100/62  Pulse: 80  Resp: 20  Temp: 98.2 F (36.8 C)  SpO2: 92%   There is no height or weight on file to calculate BMI. Physical Exam Vitals signs reviewed.  Constitutional:      Appearance: Normal appearance.  HENT:     Head: Normocephalic.     Nose: Nose normal.     Mouth/Throat:     Mouth: Mucous membranes are moist.     Pharynx: Oropharynx is clear.  Eyes:     Pupils: Pupils are equal, round, and reactive to light.  Neck:     Musculoskeletal: Neck supple.  Cardiovascular:     Rate and Rhythm: Normal rate.     Heart sounds: Murmur present.  Pulmonary:     Effort: Pulmonary effort is normal.     Breath sounds: Normal breath sounds.  Abdominal:     General: Abdomen is flat. Bowel sounds are normal.     Palpations: Abdomen is soft.  Musculoskeletal:     Comments: Mild swelling Bilateral  Skin:    General: Skin is warm and dry.  Neurological:     General: No focal deficit present.     Mental Status: She is alert.  Psychiatric:        Mood and Affect: Mood normal.        Thought Content: Thought content normal.        Judgment: Judgment normal.     Labs reviewed: Basic Metabolic Panel: Recent Labs    01/26/19 1459  05/02/19 0338 05/03/19 0429 05/04/19 0505  NA 140   < > 140 134* 136  K 4.4   < > 4.4 4.4 4.6  CL 100   < > 104 100 100  CO2 25   < > 28 24 29   GLUCOSE 95   < > 139* 132* 93  BUN 20   < > 19 19 19   CREATININE 0.89   < > 0.66 0.67 0.66  CALCIUM 9.7   < >  9.7 8.5* 8.5*  TSH 3.180  --   --   --   --    < > = values in this interval not displayed.   Liver Function Tests: Recent Labs    01/26/19 1459 02/08/19 1940 05/02/19 0338  AST 24 25 25   ALT  15 16 18   ALKPHOS 65 67 59  BILITOT 0.3 0.7 0.8  PROT 7.0 7.4 7.9  ALBUMIN 4.5 4.0 4.2   Recent Labs    02/08/19 1940  LIPASE 25   No results for input(s): AMMONIA in the last 8760 hours. CBC: Recent Labs    02/08/19 1940  05/03/19 0429 05/04/19 0505 05/05/19 0358  WBC 5.5   < > 8.6 8.1 8.2  NEUTROABS 4.0  --   --   --   --   HGB 13.9   < > 9.4* 8.0* 7.8*  HCT 42.0   < > 28.8* 24.6* 24.2*  MCV 94.2   < > 96.3 97.2 96.8  PLT 219   < > 151 132* 167   < > = values in this interval not displayed.   Lipid Panel: No results for input(s): CHOL, HDL, LDLCALC, TRIG, CHOLHDL, LDLDIRECT in the last 8760 hours. Lab Results  Component Value Date   HGBA1C 5.6 09/13/2014    Procedures since last visit: Dg Chest 1 View  Result Date: 05/01/2019 CLINICAL DATA:  85 year female with left hip fracture. Preop evaluation. EXAM: CHEST  1 VIEW COMPARISON:  Chest radiograph dated 12/17/2017 FINDINGS: No focal consolidation, pleural effusion, pneumothorax. Borderline cardiomegaly. Calcification of the mitral annulus as well as atherosclerotic calcification of the aorta. No acute osseous pathology. Degenerative changes of spine. IMPRESSION: No active disease. Electronically Signed   By: Anner Crete M.D.   On: 05/01/2019 22:41   Dg Pelvis 1-2 Views  Result Date: 05/01/2019 CLINICAL DATA:  85 year old female with fall and left lower extremity pain. EXAM: PELVIS - 1-2 VIEW; LEFT FEMUR 2 VIEWS COMPARISON:  CT of the abdomen pelvis dated 02/08/2019 FINDINGS: Evaluation is limited due to positioning of the hips. There is a nondisplaced fracture of the left femoral neck. There is no dislocation. The bones are osteopenic. Degenerative changes of the hips and SI joints. IMPRESSION: Nondisplaced fracture of the left femoral neck. Electronically Signed   By: Anner Crete M.D.   On: 05/01/2019 22:38   Ct Head Wo Contrast  Result Date: 05/01/2019 CLINICAL DATA:  Fall EXAM: CT HEAD WITHOUT  CONTRAST CT CERVICAL SPINE WITHOUT CONTRAST TECHNIQUE: Multidetector CT imaging of the head and cervical spine was performed following the standard protocol without intravenous contrast. Multiplanar CT image reconstructions of the cervical spine were also generated. COMPARISON:  None. FINDINGS: CT HEAD FINDINGS Brain: There is no mass, hemorrhage or extra-axial collection. There is generalized atrophy without lobar predilection. There is hypoattenuation of the periventricular white matter, most commonly indicating chronic ischemic microangiopathy. Vascular: No abnormal hyperdensity of the major intracranial arteries or dural venous sinuses. No intracranial atherosclerosis. Skull: The visualized skull base, calvarium and extracranial soft tissues are normal. Sinuses/Orbits: There are secretions within both maxillary sinuses. The right frontal sinus is opacified. Partial opacification of the ethmoid air cells. No mastoid or middle ear effusion. The orbits are normal. CT CERVICAL SPINE FINDINGS Alignment: There is grade 1 anterolisthesis at C5-6, likely due to facet arthrosis. Skull base and vertebrae: There is no fracture. Soft tissues and spinal canal: No prevertebral fluid or swelling. No visible canal hematoma. Disc levels: There is severe  multilevel facet arthrosis. The right facets are fused at C2-C4. Disc degeneration is greatest at C5-7. No bony spinal canal stenosis. Upper chest: Biapical emphysema. Other: Normal visualized paraspinal cervical soft tissues. IMPRESSION: 1. Chronic ischemic microangiopathy and generalized atrophy without acute intracranial abnormality. 2. No acute fracture of the cervical spine. Electronically Signed   By: Ulyses Jarred M.D.   On: 05/01/2019 23:37   Ct Cervical Spine Wo Contrast  Result Date: 05/01/2019 CLINICAL DATA:  Fall EXAM: CT HEAD WITHOUT CONTRAST CT CERVICAL SPINE WITHOUT CONTRAST TECHNIQUE: Multidetector CT imaging of the head and cervical spine was performed  following the standard protocol without intravenous contrast. Multiplanar CT image reconstructions of the cervical spine were also generated. COMPARISON:  None. FINDINGS: CT HEAD FINDINGS Brain: There is no mass, hemorrhage or extra-axial collection. There is generalized atrophy without lobar predilection. There is hypoattenuation of the periventricular white matter, most commonly indicating chronic ischemic microangiopathy. Vascular: No abnormal hyperdensity of the major intracranial arteries or dural venous sinuses. No intracranial atherosclerosis. Skull: The visualized skull base, calvarium and extracranial soft tissues are normal. Sinuses/Orbits: There are secretions within both maxillary sinuses. The right frontal sinus is opacified. Partial opacification of the ethmoid air cells. No mastoid or middle ear effusion. The orbits are normal. CT CERVICAL SPINE FINDINGS Alignment: There is grade 1 anterolisthesis at C5-6, likely due to facet arthrosis. Skull base and vertebrae: There is no fracture. Soft tissues and spinal canal: No prevertebral fluid or swelling. No visible canal hematoma. Disc levels: There is severe multilevel facet arthrosis. The right facets are fused at C2-C4. Disc degeneration is greatest at C5-7. No bony spinal canal stenosis. Upper chest: Biapical emphysema. Other: Normal visualized paraspinal cervical soft tissues. IMPRESSION: 1. Chronic ischemic microangiopathy and generalized atrophy without acute intracranial abnormality. 2. No acute fracture of the cervical spine. Electronically Signed   By: Ulyses Jarred M.D.   On: 05/01/2019 23:37   Pelvis Portable  Result Date: 05/02/2019 CLINICAL DATA:  Post left hip arthroplasty. EXAM: PORTABLE PELVIS 1-2 VIEWS COMPARISON:  05/01/2019 FINDINGS: Mild diffuse osteopenia. Evidence of patient's recent left hip arthroplasty which is intact and normally located. Skin staples over the lateral soft tissues of the left hip. Remainder of the exam is  unchanged. IMPRESSION: Expected changes post left hip arthroplasty. Electronically Signed   By: Marin Olp M.D.   On: 05/02/2019 16:44   Dg Femur Min 2 Views Left  Result Date: 05/01/2019 CLINICAL DATA:  85 year old female with fall and left lower extremity pain. EXAM: PELVIS - 1-2 VIEW; LEFT FEMUR 2 VIEWS COMPARISON:  CT of the abdomen pelvis dated 02/08/2019 FINDINGS: Evaluation is limited due to positioning of the hips. There is a nondisplaced fracture of the left femoral neck. There is no dislocation. The bones are osteopenic. Degenerative changes of the hips and SI joints. IMPRESSION: Nondisplaced fracture of the left femoral neck. Electronically Signed   By: Anner Crete M.D.   On: 05/01/2019 22:38    Assessment/Plan  Hypoxia Patient came to the facility she was hypoxic needing oxygen Her oxygen came back on 2 L Her Chest Xray was normal Dopplers of LE were Normal She is still on 2l of Oxygen Will continue to monitor Confusion Per nurses some confusion in the evening Will try to limit her Norco  S/p left hemiarthroplasty WBAT Pain seems controlled Working with therapy Follow up with Ortho On Aspirin for DVt prophlaxis   Valvular heart disease with Pulmonary Hypertension Was taken off Norvasc in hospital Will  continue on Oxygen for now  H/o PAF Continue on Amiodarone Not on any Anticoagulation due to h/o GI bleed  Anemia Post op on Iron Repeat CBC in 1 week  H/o Neuropathy On Neurontin and Keppra  B12 def Continue on Viit B12  Depression with anxiety On Remeron Also on Namenda   Labs/tests ordered:  * No order type specified * Next appt:  Visit date not found Total time spent in this patient care encounter was  _25  minutes; greater than 50% of the visit spent counseling patient and staff, reviewing records , Labs and coordinating care for problems addressed at this encounter.

## 2019-05-08 ENCOUNTER — Encounter: Payer: Self-pay | Admitting: Internal Medicine

## 2019-05-08 ENCOUNTER — Other Ambulatory Visit: Payer: Self-pay | Admitting: Internal Medicine

## 2019-05-08 ENCOUNTER — Non-Acute Institutional Stay (SKILLED_NURSING_FACILITY): Payer: Medicare Other | Admitting: Internal Medicine

## 2019-05-08 DIAGNOSIS — R0902 Hypoxemia: Secondary | ICD-10-CM | POA: Diagnosis not present

## 2019-05-08 DIAGNOSIS — I38 Endocarditis, valve unspecified: Secondary | ICD-10-CM

## 2019-05-08 DIAGNOSIS — K529 Noninfective gastroenteritis and colitis, unspecified: Secondary | ICD-10-CM | POA: Diagnosis not present

## 2019-05-08 DIAGNOSIS — I480 Paroxysmal atrial fibrillation: Secondary | ICD-10-CM | POA: Diagnosis not present

## 2019-05-08 NOTE — Progress Notes (Signed)
Location:  [place] Room Number: [id number] Place of Service:  SNF [phone]) Provider:  Veleta Miners  MD  Deland Pretty, MD  Patient Care Team: Deland Pretty, MD as PCP - General (Internal Medicine) Troy Sine, MD as PCP - Cardiology (Cardiology) Troy Sine, MD as Consulting Physician (Cardiology)  Extended Emergency Contact Information Primary Emergency Contact: Burley Saver, [place] [address] [place] of [place] Phone: [phone] Relation: Son Secondary Emergency Contact: Dierdre Forth, [place] [address] [place] Phone: [phone] Relation: Relative  Code Status:  Full Code Goals of care: Advanced Directive information Advanced Directives 05/02/2019  Does Patient Have a Medical Advance Directive? Yes  Type of Paramedic of [place];Living will  Does patient want to make changes to medical advance directive? Yes (Inpatient - patient defers changing a medical advance directive at this time - Information given)  Copy of [place] in Chart? No - copy requested  Would patient like information on creating a medical advance directive? -     Chief Complaint  Patient presents with   Acute Visit    Medication review and Diarrhea    HPI:  Pt is a 85 y.o. female seen today for an acute visit for Diarrhea and Medication Review  Patient was admitted in the hospital from 9 /11-9/ 15 for left displaced femoral neck fracture.  She underwent left hip monopolar hemiarthroplasty on 9/12  Patient has a history of hypertension, hyperlipidemia,  CAD, history of atrial fibrillation controlled with amiodarone not on any anticoagulation due to risk of falls and GI bleed, also has history of GI bleed with anemia, patient also has valvular heart disease with severe MR and TR and Aortic Stenosis.  With elevated right ventricular systolic pressure, history of  peripheral neuropathy with gait abnormality, history of chronic right foot wound  Patient seen today and she has had 2 or 3 episodes of diarrhea this morning.With Cramping .Patient has had work-up in the past for her diarrhea and has been on Colestipol. It was stopped in the hospital because patient was constipated.  And we did not start  as she was complaining of constipation.  I had added Senokot yesterday.  Patient's son is also concerned that patient was on increased dose of Remeron.  Her dose was recently increased Patient also has few episodes in the evenings when she is confused.  We have decreased her Norco she but therapy is doing better do her transfers with mild assist   Past Medical History:  Diagnosis Date   Abnormality of gait 09/07/2015   Anemia, iron deficiency 09/16/2014   Anxiety    Arthritis    "hands and feet" (12/06/2017)   CAD (coronary artery disease)    a. 08/2014 NSTEMI/Cath: mild Ca2+ or RCA ostium, otw nl cors. CO 3.3 L/min (thermo), 3.7 L/min ([place]).   Carotid arterial disease ([facility]) 07/30/2012   carotid doppler; normal study   CHF (congestive heart failure) (HCC)    Chronic anticoagulation, with coumadin, with PAF and CHADS2Vasc2 score of 4 09/16/2014   Chronic back pain    "all over" (0000000)   Complication of anesthesia    "they had hard time waking me up" (12/06/2017)   Degenerative arthritis    Diverticulosis    Essential hypertension    GERD (gastroesophageal reflux disease)    History of blood transfusion 12/06/2017  History of hiatal hernia    History of nuclear stress test 09/19/2007   normal pattern of perfusion; post-stress EF 86%; EKG negative for ischemia; low risk scan   Hyperlipidemia    Left carotid bruit    a. 07/2015 Carotid U/S: 1-39% bilat ICA stenosis.   Memory disorder 03/05/2014   Mild renal insufficiency    Mitral prolapse    Neuropathy    "hands and feet" (12/06/2017)   PAF (paroxysmal atrial  fibrillation) ([facility])    a. 08/2014-->Coumadin (CHA2DS2VASc = 4).   Peripheral neuropathy    Small fiber    Pre-syncope    a. 04/2015 in setting of bradycardia-->CCB/BB doses adjusted.   Pulmonary hypertension (HCC)    Seasonal allergies    Skin cancer    "burned off my face" (12/06/2017)   Valvular heart disease    a. 04/2015 Echo: EF 65-70%, no rwma, Gr1 DD, mild AS, triv AI, mild MS, mod MR, mod dil LA, mod TR, sev increased PASP.   Past Surgical History:  Procedure Laterality Date   ABDOMINAL HYSTERECTOMY     APPENDECTOMY     BUNIONECTOMY Bilateral    CARPAL TUNNEL RELEASE Left    CHOLECYSTECTOMY OPEN     DILATION AND CURETTAGE OF UTERUS     ESOPHAGOGASTRODUODENOSCOPY N/A 12/10/2017   Procedure: ESOPHAGOGASTRODUODENOSCOPY (EGD);  Surgeon: Clarene Essex, MD;  Location: [facility];  Service: Endoscopy;  Laterality: N/A;   FERTILITY SURGERY     resuspension procedure    FOOT FRACTURE SURGERY Left    FRACTURE SURGERY     HIP ARTHROPLASTY Left 05/02/2019   Procedure: ARTHROPLASTY BIPOLAR HIP (HEMIARTHROPLASTY);  Surgeon: Marybelle Killings, MD;  Location: [facility];  Service: Orthopedics;  Laterality: Left;   JOINT REPLACEMENT     LEFT HEART CATHETERIZATION WITH CORONARY ANGIOGRAM N/A 09/14/2014   Procedure: LEFT HEART CATHETERIZATION WITH CORONARY ANGIOGRAM;  Surgeon: Troy Sine, MD;  Location: [facility] CATH LAB;  Service: Cardiovascular;  Laterality: N/A;   REPLACEMENT TOTAL KNEE Right 03/2008   Archie Endo 12/19/2010   ROBOTIC ASSISTED BILATERAL SALPINGO OOPHERECTOMY Bilateral 02/19/2017   Procedure: XI ROBOTIC ASSISTED BILATERAL SALPINGO OOPHORECTOMY AND LYSIS OF ADHESION;  Surgeon: Everitt Amber, MD;  Location: [facility];  Service: Gynecology;  Laterality: Bilateral;   TEAR DUCT PROBING Left    TONSILLECTOMY     UMBILICAL HERNIA REPAIR      Allergies  Allergen Reactions   Avelox [Moxifloxacin Hcl In [place]] Shortness Of Breath and Swelling   Moxifloxacin Other (See Comments),  Shortness Of Breath and Swelling    other   Aricept [Donepezil Hcl] Diarrhea   Codeine Swelling        Donepezil Diarrhea   Garlic Diarrhea   Latex Hives, Itching, Rash and Other (See Comments)    Watery blisters    Onion Diarrhea   Sulfa Drugs Cross Reactors Swelling   Duloxetine Rash   Polysporin [Bacitracin-Polymyxin B] Other (See Comments)    other    Outpatient Encounter Medications as of 05/08/2019  Medication Sig   acetaminophen (TYLENOL) 500 MG tablet Take 500 mg by mouth every 6 (six) hours.    amiodarone (PACERONE) 100 MG tablet Take 1 tablet (100 mg total) by mouth daily.   aspirin (BAYER ASPIRIN) 325 MG tablet Take 1 tablet (325 mg total) by mouth daily.   atorvastatin (LIPITOR) 10 MG tablet Take 5 mg by mouth daily.    calcium carbonate (OS-CAL) 1250 (500 Ca) MG chewable tablet Chew 1 tablet by mouth daily.  cetirizine (ZYRTEC) 10 MG tablet Take 10 mg by mouth daily with breakfast.    cholecalciferol (VITAMIN D) 1000 UNITS tablet Take 2,000 Units by mouth daily.    Cyanocobalamin (B-12) 1000 MCG TABS Take 1 tablet by mouth daily.   docusate sodium (COLACE) 100 MG capsule Take 100 mg by mouth daily.   famotidine (PEPCID) 20 MG tablet Take 20 mg by mouth daily.    ferrous sulfate 325 (65 FE) MG tablet Take 325 mg by mouth daily.   fluticasone (FLONASE) 50 MCG/ACT nasal spray Place 1 spray into both nostrils daily.   gabapentin (NEURONTIN) 300 MG capsule Take 300 mg by mouth 3 (three) times daily.   gabapentin (NEURONTIN) 300 MG capsule Take 900 mg by mouth at bedtime.   HYDROcodone-acetaminophen (NORCO/VICODIN) 5-325 MG tablet Take 1 tablet by mouth every 8 (eight) hours as needed for moderate pain.    levETIRAcetam (KEPPRA) 250 MG tablet Take 1/2 tablet by mouth in the morning and 2 tablets by mouth in the evening.   memantine (NAMENDA) 10 MG tablet Take 10 mg by mouth 2 (two) times daily.   mirtazapine (REMERON) 7.5 MG tablet Take 7.5 mg by  mouth at bedtime.   Multiple Vitamins-Minerals (MULTIVITAMIN WITH MINERALS) tablet Take 1 tablet by mouth daily.   pantoprazole (PROTONIX) 40 MG tablet Take 40 mg by mouth daily.   senna (SENOKOT) 8.6 MG TABS tablet Take 2 tablets by mouth at bedtime.   No facility-administered encounter medications on file as of 05/08/2019.     Review of Systems  Constitutional: Negative.   HENT: Negative.   Respiratory: Positive for shortness of breath.   Gastrointestinal: Positive for abdominal pain and diarrhea.  Genitourinary: Negative.   Musculoskeletal: Positive for gait problem and myalgias.  Neurological: Positive for weakness.  Psychiatric/Behavioral: Positive for dysphoric mood.  All other systems reviewed and are negative.   Immunization History  Administered Date(s) Administered   Influenza, High Dose Seasonal PF 06/13/2017   Zoster Recombinat (Shingrix) 02/13/2018, 06/05/2018   Pertinent  Health Maintenance Due  Topic Date Due   DEXA SCAN  05/16/1989   PNA vac Low Risk Adult (1 of 2 - PCV13) 05/16/1989   INFLUENZA VACCINE  03/21/2019   Fall Risk  04/17/2018 01/27/2016  Falls in the past year? Yes No  Number falls in past yr: 1 -  Injury with Fall? Yes -  Risk for fall due to : Impaired balance/gait -   Functional Status Survey:    Vitals:   05/08/19 1405  BP: 140/60  Pulse: 82  Resp: (!) 22  Temp: 97.8 F (36.6 C)  SpO2: 92%  Weight: 119 lb 12.8 oz (54.3 kg)  Height: 5\' 2"  (1.575 m)   Body mass index is 21.91 kg/m. Physical Exam Vitals signs reviewed.  Constitutional:      Appearance: Normal appearance.  HENT:     Head: Normocephalic.     Nose: Nose normal.     Mouth/Throat:     Mouth: Mucous membranes are moist.     Pharynx: Oropharynx is clear.  Eyes:     Pupils: Pupils are equal, round, and reactive to light.  Neck:     Musculoskeletal: Neck supple.  Cardiovascular:     Rate and Rhythm: Normal rate.     Heart sounds: Murmur present.    Pulmonary:     Effort: Pulmonary effort is normal.     Breath sounds: Wheezing present.  Abdominal:     General: Abdomen is flat. Bowel  sounds are normal. There is no distension.     Palpations: Abdomen is soft.     Tenderness: There is no abdominal tenderness. There is no guarding.  Musculoskeletal:     Comments: Mild Swelling Bilateral  Skin:    General: Skin is warm and dry.  Neurological:     General: No focal deficit present.     Mental Status: She is alert.  Psychiatric:        Mood and Affect: Mood normal.        Thought Content: Thought content normal.        Judgment: Judgment normal.     Labs reviewed: Recent Labs    05/02/19 0338 05/03/19 0429 05/04/19 0505  NA 140 134* 136  K 4.4 4.4 4.6  CL 104 100 100  CO2 28 24 29   GLUCOSE 139* 132* 93  BUN 19 19 19   CREATININE 0.66 0.67 0.66  CALCIUM 9.7 8.5* 8.5*   Recent Labs    01/26/19 1459 02/08/19 1940 05/02/19 0338  AST 24 25 25   ALT 15 16 18   ALKPHOS 65 67 59  BILITOT 0.3 0.7 0.8  PROT 7.0 7.4 7.9  ALBUMIN 4.5 4.0 4.2   Recent Labs    02/08/19 1940  05/03/19 0429 05/04/19 0505 05/05/19 0358  WBC 5.5   < > 8.6 8.1 8.2  NEUTROABS 4.0  --   --   --   --   HGB 13.9   < > 9.4* 8.0* 7.8*  HCT 42.0   < > 28.8* 24.6* 24.2*  MCV 94.2   < > 96.3 97.2 96.8  PLT 219   < > 151 132* 167   < > = values in this interval not displayed.   Lab Results  Component Value Date   TSH 3.180 01/26/2019   Lab Results  Component Value Date   HGBA1C 5.6 09/13/2014   Lab Results  Component Value Date   CHOL 111 12/11/2016   HDL 66 12/11/2016   LDLCALC 31 12/11/2016   TRIG 71 12/11/2016   CHOLHDL 1.7 12/11/2016    Significant Diagnostic Results in last 30 days:  Dg Chest 1 View  Result Date: 05/01/2019 CLINICAL DATA:  85 year female with left hip fracture. Preop evaluation. EXAM: CHEST  1 VIEW COMPARISON:  Chest radiograph dated 12/17/2017 FINDINGS: No focal consolidation, pleural effusion,  pneumothorax. Borderline cardiomegaly. Calcification of the mitral annulus as well as atherosclerotic calcification of the aorta. No acute osseous pathology. Degenerative changes of spine. IMPRESSION: No active disease. Electronically Signed   By: Anner Crete M.D.   On: 05/01/2019 22:41   Dg Pelvis 1-2 Views  Result Date: 05/01/2019 CLINICAL DATA:  85 year old female with fall and left lower extremity pain. EXAM: PELVIS - 1-2 VIEW; LEFT FEMUR 2 VIEWS COMPARISON:  CT of the abdomen pelvis dated 02/08/2019 FINDINGS: Evaluation is limited due to positioning of the hips. There is a nondisplaced fracture of the left femoral neck. There is no dislocation. The bones are osteopenic. Degenerative changes of the hips and SI joints. IMPRESSION: Nondisplaced fracture of the left femoral neck. Electronically Signed   By: Anner Crete M.D.   On: 05/01/2019 22:38   Ct Head Wo Contrast  Result Date: 05/01/2019 CLINICAL DATA:  Fall EXAM: CT HEAD WITHOUT CONTRAST CT CERVICAL SPINE WITHOUT CONTRAST TECHNIQUE: Multidetector CT imaging of the head and cervical spine was performed following the standard protocol without intravenous contrast. Multiplanar CT image reconstructions of the cervical spine were also generated.  COMPARISON:  None. FINDINGS: CT HEAD FINDINGS Brain: There is no mass, hemorrhage or extra-axial collection. There is generalized atrophy without lobar predilection. There is hypoattenuation of the periventricular white matter, most commonly indicating chronic ischemic microangiopathy. Vascular: No abnormal hyperdensity of the major intracranial arteries or dural venous sinuses. No intracranial atherosclerosis. Skull: The visualized skull base, calvarium and extracranial soft tissues are normal. Sinuses/Orbits: There are secretions within both maxillary sinuses. The right frontal sinus is opacified. Partial opacification of the ethmoid air cells. No mastoid or middle ear effusion. The orbits are normal.  CT CERVICAL SPINE FINDINGS Alignment: There is grade 1 anterolisthesis at C5-6, likely due to facet arthrosis. Skull base and vertebrae: There is no fracture. Soft tissues and spinal canal: No prevertebral fluid or swelling. No visible canal hematoma. Disc levels: There is severe multilevel facet arthrosis. The right facets are fused at C2-C4. Disc degeneration is greatest at C5-7. No bony spinal canal stenosis. Upper chest: Biapical emphysema. Other: Normal visualized paraspinal cervical soft tissues. IMPRESSION: 1. Chronic ischemic microangiopathy and generalized atrophy without acute intracranial abnormality. 2. No acute fracture of the cervical spine. Electronically Signed   By: Ulyses Jarred M.D.   On: 05/01/2019 23:37   Ct Cervical Spine Wo Contrast  Result Date: 05/01/2019 CLINICAL DATA:  Fall EXAM: CT HEAD WITHOUT CONTRAST CT CERVICAL SPINE WITHOUT CONTRAST TECHNIQUE: Multidetector CT imaging of the head and cervical spine was performed following the standard protocol without intravenous contrast. Multiplanar CT image reconstructions of the cervical spine were also generated. COMPARISON:  None. FINDINGS: CT HEAD FINDINGS Brain: There is no mass, hemorrhage or extra-axial collection. There is generalized atrophy without lobar predilection. There is hypoattenuation of the periventricular white matter, most commonly indicating chronic ischemic microangiopathy. Vascular: No abnormal hyperdensity of the major intracranial arteries or dural venous sinuses. No intracranial atherosclerosis. Skull: The visualized skull base, calvarium and extracranial soft tissues are normal. Sinuses/Orbits: There are secretions within both maxillary sinuses. The right frontal sinus is opacified. Partial opacification of the ethmoid air cells. No mastoid or middle ear effusion. The orbits are normal. CT CERVICAL SPINE FINDINGS Alignment: There is grade 1 anterolisthesis at C5-6, likely due to facet arthrosis. Skull base and  vertebrae: There is no fracture. Soft tissues and spinal canal: No prevertebral fluid or swelling. No visible canal hematoma. Disc levels: There is severe multilevel facet arthrosis. The right facets are fused at C2-C4. Disc degeneration is greatest at C5-7. No bony spinal canal stenosis. Upper chest: Biapical emphysema. Other: Normal visualized paraspinal cervical soft tissues. IMPRESSION: 1. Chronic ischemic microangiopathy and generalized atrophy without acute intracranial abnormality. 2. No acute fracture of the cervical spine. Electronically Signed   By: Ulyses Jarred M.D.   On: 05/01/2019 23:37   Pelvis Portable  Result Date: 05/02/2019 CLINICAL DATA:  Post left hip arthroplasty. EXAM: PORTABLE PELVIS 1-2 VIEWS COMPARISON:  05/01/2019 FINDINGS: Mild diffuse osteopenia. Evidence of patient's recent left hip arthroplasty which is intact and normally located. Skin staples over the lateral soft tissues of the left hip. Remainder of the exam is unchanged. IMPRESSION: Expected changes post left hip arthroplasty. Electronically Signed   By: Marin Olp M.D.   On: 05/02/2019 16:44   Dg Femur Min 2 Views Left  Result Date: 05/01/2019 CLINICAL DATA:  85 year old female with fall and left lower extremity pain. EXAM: PELVIS - 1-2 VIEW; LEFT FEMUR 2 VIEWS COMPARISON:  CT of the abdomen pelvis dated 02/08/2019 FINDINGS: Evaluation is limited due to positioning of the hips. There  is a nondisplaced fracture of the left femoral neck. There is no dislocation. The bones are osteopenic. Degenerative changes of the hips and SI joints. IMPRESSION: Nondisplaced fracture of the left femoral neck. Electronically Signed   By: Anner Crete M.D.   On: 05/01/2019 22:38    Assessment/Plan Chronic Diarrhea Will restart on Colestipol TID Send Stools for C Diff Use Imodium PRN  History of depression Recently her PCP had increased her Remeron to 15 Discussed with the son that at this time we will wait due to her  confusion at night Continue 7.5 mg for now Hypoxia Chest x-ray was now normal Dopplers of lower extremities were normal We will continue on 2 L of oxygen for now Confusion with sundowning Continue to limit her Norco S/p left hemiarthroplasty WBAT Working with therapy Follows with Ortho On aspirin for DVT prophylaxis  Valvular heart disease with Pulmonary Hypertension Was taken off Norvasc in hospital Will continue on Oxygen for now  H/o PAF Continue on Amiodarone Not on any Anticoagulation due to h/o GI bleed  Anemia Post op on Iron Repeat CBC Pending  H/o Neuropathy On Neurontin and Keppra  B12 def Continue on Viit B12   Family/ staff Communication:   Labs/tests ordered:   Total time spent in this patient care encounter was  45_  minutes; greater than 50% of the visit spent counseling patient and Her Son and staff, reviewing records , Labs and coordinating care for problems addressed at this encounter.

## 2019-05-12 ENCOUNTER — Other Ambulatory Visit: Payer: Self-pay

## 2019-05-12 ENCOUNTER — Emergency Department (HOSPITAL_COMMUNITY)
Admission: EM | Admit: 2019-05-12 | Discharge: 2019-05-13 | Disposition: A | Discharge status: Skilled Nursing Facility | Payer: Medicare Other | Attending: Emergency Medicine | Admitting: Emergency Medicine

## 2019-05-12 DIAGNOSIS — Z9889 Other specified postprocedural states: Secondary | ICD-10-CM | POA: Insufficient documentation

## 2019-05-12 DIAGNOSIS — Z91040 Latex allergy status: Secondary | ICD-10-CM | POA: Insufficient documentation

## 2019-05-12 DIAGNOSIS — Z87891 Personal history of nicotine dependence: Secondary | ICD-10-CM | POA: Insufficient documentation

## 2019-05-12 DIAGNOSIS — I110 Hypertensive heart disease with heart failure: Secondary | ICD-10-CM | POA: Insufficient documentation

## 2019-05-12 DIAGNOSIS — I2510 Atherosclerotic heart disease of native coronary artery without angina pectoris: Secondary | ICD-10-CM | POA: Insufficient documentation

## 2019-05-12 DIAGNOSIS — Z79899 Other long term (current) drug therapy: Secondary | ICD-10-CM | POA: Insufficient documentation

## 2019-05-12 DIAGNOSIS — R197 Diarrhea, unspecified: Secondary | ICD-10-CM | POA: Diagnosis not present

## 2019-05-12 DIAGNOSIS — I509 Heart failure, unspecified: Secondary | ICD-10-CM | POA: Insufficient documentation

## 2019-05-12 DIAGNOSIS — D649 Anemia, unspecified: Secondary | ICD-10-CM

## 2019-05-12 DIAGNOSIS — D500 Iron deficiency anemia secondary to blood loss (chronic): Secondary | ICD-10-CM | POA: Diagnosis not present

## 2019-05-12 LAB — CBC WITH DIFFERENTIAL/PLATELET
Abs Immature Granulocytes: 0.13 10*3/uL — ABNORMAL HIGH (ref 0.00–0.07)
Basophils Absolute: 0.1 10*3/uL (ref 0.0–0.1)
Basophils Relative: 0 %
Eosinophils Absolute: 1.5 10*3/uL — ABNORMAL HIGH (ref 0.0–0.5)
Eosinophils Relative: 11 %
HCT: 22.9 % — ABNORMAL LOW (ref 36.0–46.0)
Hemoglobin: 7.1 g/dL — ABNORMAL LOW (ref 12.0–15.0)
Immature Granulocytes: 1 %
Lymphocytes Relative: 9 %
Lymphs Abs: 1.3 10*3/uL (ref 0.7–4.0)
MCH: 31.4 pg (ref 26.0–34.0)
MCHC: 31 g/dL (ref 30.0–36.0)
MCV: 101.3 fL — ABNORMAL HIGH (ref 80.0–100.0)
Monocytes Absolute: 0.8 10*3/uL (ref 0.1–1.0)
Monocytes Relative: 6 %
Neutro Abs: 10.1 10*3/uL — ABNORMAL HIGH (ref 1.7–7.7)
Neutrophils Relative %: 73 %
Platelets: 499 10*3/uL — ABNORMAL HIGH (ref 150–400)
RBC: 2.26 MIL/uL — ABNORMAL LOW (ref 3.87–5.11)
RDW: 15.6 % — ABNORMAL HIGH (ref 11.5–15.5)
WBC: 13.9 10*3/uL — ABNORMAL HIGH (ref 4.0–10.5)
nRBC: 0.1 % (ref 0.0–0.2)

## 2019-05-12 LAB — BASIC METABOLIC PANEL
Anion gap: 10 (ref 5–15)
BUN: 19 (ref 4–21)
BUN: 22 mg/dL (ref 8–23)
CO2: 28 mmol/L (ref 22–32)
Calcium: 9.2 mg/dL (ref 8.9–10.3)
Chloride: 98 mmol/L (ref 98–111)
Creatinine, Ser: 0.64 mg/dL (ref 0.44–1.00)
Creatinine: 0.6 (ref 0.5–1.1)
GFR calc Af Amer: >60 mL/min (ref >60)
GFR calc non Af Amer: >60 mL/min (ref >60)
Glucose, Bld: 116 mg/dL — ABNORMAL HIGH (ref 70–99)
Glucose: 98
Potassium: 3.7 mmol/L (ref 3.5–5.1)
Potassium: 4.8 (ref 3.4–5.3)
Sodium: 136 mmol/L (ref 135–145)
Sodium: 140 (ref 137–147)

## 2019-05-12 LAB — CBC AND DIFFERENTIAL
HCT: 20 — AB (ref 36–46)
Hemoglobin: 6.5 — AB (ref 12.0–16.0)
Platelets: 389 (ref 150–399)
WBC: 9.9

## 2019-05-12 LAB — POC OCCULT BLOOD, ED: Fecal Occult Bld: NEGATIVE

## 2019-05-12 NOTE — ED Triage Notes (Signed)
Pt arrived via [facility] from [facility]. Pt was discharged from hospital on 9/15 with L hip fracture. Follow up appt showed a Hgb of 6.5. Pt states she came in today to get a blood transfusion.   EMS Vitals BP 100/78 HR 70 SpO2 96% room air Temp 97.2 oral
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
## 2019-05-13 ENCOUNTER — Encounter: Payer: Self-pay | Admitting: Nurse Practitioner

## 2019-05-13 ENCOUNTER — Encounter: Payer: Self-pay | Admitting: *Deleted

## 2019-05-13 ENCOUNTER — Non-Acute Institutional Stay (SKILLED_NURSING_FACILITY): Payer: Medicare Other | Admitting: Nurse Practitioner

## 2019-05-13 DIAGNOSIS — I272 Pulmonary hypertension, unspecified: Secondary | ICD-10-CM | POA: Diagnosis not present

## 2019-05-13 DIAGNOSIS — K219 Gastro-esophageal reflux disease without esophagitis: Secondary | ICD-10-CM | POA: Diagnosis not present

## 2019-05-13 DIAGNOSIS — D62 Acute posthemorrhagic anemia: Secondary | ICD-10-CM | POA: Diagnosis not present

## 2019-05-13 DIAGNOSIS — K529 Noninfective gastroenteritis and colitis, unspecified: Secondary | ICD-10-CM

## 2019-05-13 DIAGNOSIS — D649 Anemia, unspecified: Secondary | ICD-10-CM | POA: Diagnosis not present

## 2019-05-13 LAB — URINALYSIS, ROUTINE W REFLEX MICROSCOPIC
Bilirubin Urine: NEGATIVE
Glucose, UA: NEGATIVE mg/dL
Hgb urine dipstick: NEGATIVE
Ketones, ur: NEGATIVE mg/dL
Nitrite: NEGATIVE
Protein, ur: NEGATIVE mg/dL
Specific Gravity, Urine: 1.011 (ref 1.005–1.030)
pH: 5 (ref 5.0–8.0)

## 2019-05-13 LAB — CHLORIDE
Calcium: 8.6
Carbon Dioxide, Total: 31
Chloride: 102
EGFR (Non-African Amer.): 78

## 2019-05-13 LAB — PREPARE RBC (CROSSMATCH)

## 2019-05-13 MED ORDER — SODIUM CHLORIDE 0.9 % IV SOLN
10.0000 mL/h | Freq: Once | INTRAVENOUS | Status: AC
  Administered 2019-05-13: 10 mL/h via INTRAVENOUS

## 2019-05-13 MED ORDER — HYDROCODONE-ACETAMINOPHEN 5-325 MG PO TABS
1.0000 | ORAL_TABLET | Freq: Once | ORAL | Status: AC
  Administered 2019-05-13: 1 via ORAL
  Filled 2019-05-13: qty 1

## 2019-05-13 NOTE — Assessment & Plan Note (Signed)
Asymptomatic today, continue Pantoprazole 40mg  qd, Famotidine 20mg  qd.
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
## 2019-05-13 NOTE — Assessment & Plan Note (Signed)
No diarrhea today, continue Colestipol tid.

## 2019-05-13 NOTE — Assessment & Plan Note (Signed)
Continue O2 supplement.  

## 2019-05-13 NOTE — Discharge Instructions (Signed)
Thank you for allowing me to care for you today in the Emergency Department.   Please have your hemoglobin rechecked within the next week.  Return to the emergency department if you start having black or bloody bowel movements or vomiting, new significant bruising, spontaneous bleeding, or other new, concerning symptoms.

## 2019-05-13 NOTE — ED Notes (Signed)
Messaged PA that BP was 81/56

## 2019-05-13 NOTE — ED Notes (Addendum)
[facility] called for transport. [facility] called and notified of patient's return.  

## 2019-05-13 NOTE — Assessment & Plan Note (Addendum)
Post op anemia, s/p q unit RRBC 05/12/19 ED, will obtain CBC/diff  Fe, Fesat, TIBC, Ferritin, Retic count, Vit B12, Folate FOBT x3. Continue Fe, Vit B12. Hx of GI bleed, currently on ASA 325mg  for s/p L hemiarthroplasty.

## 2019-05-13 NOTE — ED Notes (Signed)
[facility] here to pick up pt.. 

## 2019-05-13 NOTE — ED Notes (Signed)
Informed PA that BP has been fluctuating around 90/50

## 2019-05-14 ENCOUNTER — Encounter: Payer: Self-pay | Admitting: Internal Medicine

## 2019-05-14 LAB — VITAMIN B12: Vitamin B-12: >2000

## 2019-05-14 LAB — BPAM RBC
Blood Product Expiration Date: 202010212359
ISSUE DATE / TIME: 202009230227
Unit Type and Rh: 5100

## 2019-05-14 LAB — CBC AND DIFFERENTIAL
HCT: 30 — AB (ref 36–46)
Hemoglobin: 9.2 — AB (ref 12.0–16.0)
Platelets: 467 — AB (ref 150–399)
WBC: 11.8

## 2019-05-14 LAB — TYPE AND SCREEN
ABO/RH(D): O POS
Antibody Screen: NEGATIVE
Unit division: 0

## 2019-05-14 NOTE — Progress Notes (Signed)
This encounter was created in error - please disregard.

## 2019-05-19 ENCOUNTER — Encounter: Payer: Self-pay | Admitting: Internal Medicine

## 2019-05-19 ENCOUNTER — Non-Acute Institutional Stay (SKILLED_NURSING_FACILITY): Payer: Medicare Other | Admitting: Internal Medicine

## 2019-05-19 ENCOUNTER — Encounter: Payer: Self-pay | Admitting: *Deleted

## 2019-05-19 ENCOUNTER — Other Ambulatory Visit: Payer: Self-pay | Admitting: *Deleted

## 2019-05-19 DIAGNOSIS — G63 Polyneuropathy in diseases classified elsewhere: Secondary | ICD-10-CM

## 2019-05-19 DIAGNOSIS — I480 Paroxysmal atrial fibrillation: Secondary | ICD-10-CM

## 2019-05-19 DIAGNOSIS — I38 Endocarditis, valve unspecified: Secondary | ICD-10-CM

## 2019-05-19 DIAGNOSIS — K922 Gastrointestinal hemorrhage, unspecified: Secondary | ICD-10-CM

## 2019-05-19 DIAGNOSIS — K529 Noninfective gastroenteritis and colitis, unspecified: Secondary | ICD-10-CM

## 2019-05-19 DIAGNOSIS — Z96642 Presence of left artificial hip joint: Secondary | ICD-10-CM | POA: Diagnosis not present

## 2019-05-19 DIAGNOSIS — D62 Acute posthemorrhagic anemia: Secondary | ICD-10-CM

## 2019-05-19 LAB — FOLATE RBC
Ferritin: 213
Folate, RBC: >1000
Iron Bind.Cap.(Total): 255
Iron Saturation: 15
Iron, Total/Total Iron Binding CAP: 37
Retic Ct Abs: 200640
Reticulocyte Count: 6.6

## 2019-05-19 MED ORDER — HYDROCODONE-ACETAMINOPHEN 5-325 MG PO TABS: 1.0000 | ORAL_TABLET | Freq: Four times a day (QID) | ORAL | 0 refills | Status: DC | PRN

## 2019-05-19 NOTE — Progress Notes (Signed)
Location:  [facility] Room Number: [id number] Place of Service:  SNF [phone]) Provider:  Veleta Miners  MD  Deland Pretty, MD  Patient Care Team: Deland Pretty, MD as PCP - General (Internal Medicine) Troy Sine, MD as PCP - Cardiology (Cardiology) Troy Sine, MD as Consulting Physician (Cardiology)  Extended Emergency Contact Information Primary Emergency Contact: Burley Saver, [place] [address] [address] Phone: [phone] Relation: Son Secondary Emergency Contact: Dierdre Forth,  [address] [place] of [place] Phone: [phone] Relation: Relative  Code Status:  Full Code Goals of care: Advanced Directive information Advanced Directives 05/12/2019  Does Patient Have a Medical Advance Directive? Yes  Type of Advance Directive [place]  Does patient want to make changes to medical advance directive? No - Patient declined  Copy of [place] in Chart? No - copy requested  Would patient like information on creating a medical advance directive? -     Chief Complaint  Patient presents with   Acute Visit    C/o - Anemia and Guaiac Positive stool Right Foot callus, Discharge Planning    HPI:  Pt is a 85 y.o. female seen today for an acute visit for Anemia with Guaiac Positive Stools  Patient was admitted in the hospital from 9/11-9/15 for left displaced femoral neck fracture.She underwent left hip monopolar hemiarthroplasty on 9/12  Patient has a history of hypertension, hyperlipidemia,CAD,history of atrial fibrillation controlled with amiodarone not on any anticoagulation due to risk of falls and GI bleed, also has history of GI bleed with anemia, patient also has valvular heart disease with severe MR and TR and Aortic Stenosis.With elevated right ventricular systolic pressure,history of peripheral neuropathy with gait abnormality, history  of chronic right foot wound  Left Hemiarthroplasty Patient is doing better with therapy.  Continues to be mod assist for.  Pain controlled on Norco and WBAT Anemia Had to be sent to the hospital for transfusion Repeat hemoglobin is now stable She is already on iron Guaiac Positive stool  Guaiac positive 1 out of 3 Right foot callus Complaining of discomfort Peripheral neuropathy Pharmacy wanted [place] to review the dose of Neurontin due to her CKD Discharge planning Not sure if patient would be able to go back to [place].  She is planning to continue therapy for now and possible [place] caregivers Diarrhea Resolved on colestipol   Past Medical History:  Diagnosis Date   Abnormality of gait 09/07/2015   Anemia, iron deficiency 09/16/2014   Anxiety    Arthritis    "hands and feet" (12/06/2017)   CAD (coronary artery disease)    a. 08/2014 NSTEMI/Cath: mild Ca2+ or RCA ostium, otw nl cors. CO 3.3 L/min (thermo), 3.7 L/min (Fick).   Carotid arterial disease ([place]) 07/30/2012   carotid doppler; normal study   CHF (congestive heart failure) (HCC)    Chronic anticoagulation, with coumadin, with PAF and CHADS2Vasc2 score of 4 09/16/2014   Chronic back pain    "all over" (0000000)   Complication of anesthesia    "they had hard time waking me up" (12/06/2017)   Degenerative arthritis    Diverticulosis    Essential hypertension    GERD (gastroesophageal reflux disease)    History of blood transfusion 12/06/2017   History of hiatal hernia    History of nuclear stress test 09/19/2007   normal pattern of  perfusion; post-stress EF 86%; EKG negative for ischemia; low risk scan   Hyperlipidemia    Left carotid bruit    a. 07/2015 Carotid U/S: 1-39% bilat ICA stenosis.   Memory disorder 03/05/2014   Mild renal insufficiency    Mitral prolapse    Neuropathy    "hands and feet" (12/06/2017)   PAF (paroxysmal atrial fibrillation) ([place])    a. 08/2014-->Coumadin (CHA2DS2VASc = 4).    Peripheral neuropathy    Small fiber    Pre-syncope    a. 04/2015 in setting of bradycardia-->CCB/BB doses adjusted.   Pulmonary hypertension (HCC)    Seasonal allergies    Skin cancer    "burned off my face" (12/06/2017)   Valvular heart disease    a. 04/2015 Echo: EF 65-70%, no rwma, Gr1 DD, mild AS, triv AI, mild MS, mod MR, mod dil LA, mod TR, sev increased PASP.   Past Surgical History:  Procedure Laterality Date   ABDOMINAL HYSTERECTOMY     APPENDECTOMY     BUNIONECTOMY Bilateral    CARPAL TUNNEL RELEASE Left    CHOLECYSTECTOMY OPEN     DILATION AND CURETTAGE OF UTERUS     ESOPHAGOGASTRODUODENOSCOPY N/A 12/10/2017   Procedure: ESOPHAGOGASTRODUODENOSCOPY (EGD);  Surgeon: Clarene Essex, MD;  Location: [facility];  Service: Endoscopy;  Laterality: N/A;   FERTILITY SURGERY     resuspension procedure    FOOT FRACTURE SURGERY Left    FRACTURE SURGERY     HIP ARTHROPLASTY Left 05/02/2019   Procedure: ARTHROPLASTY BIPOLAR HIP (HEMIARTHROPLASTY);  Surgeon: Marybelle Killings, MD;  Location: [facility];  Service: Orthopedics;  Laterality: Left;   JOINT REPLACEMENT     LEFT HEART CATHETERIZATION WITH CORONARY ANGIOGRAM N/A 09/14/2014   Procedure: LEFT HEART CATHETERIZATION WITH CORONARY ANGIOGRAM;  Surgeon: Troy Sine, MD;  [facility] C[facility] LAB;  Service: Cardiovascular;  Laterality: N/A;   REPLACEMENT TOTAL KNEE Right 03/2008   Archie Endo 12/19/2010   ROBOTIC ASSISTED BILATERAL SALPINGO OOPHERECTOMY Bilateral 02/19/2017   Procedure: XI ROBOTIC ASSISTED BILATERAL SALPINGO OOPHORECTOMY AND LYSIS OF ADHESION;  Surgeon: Everitt Amber, MD;  Location: [facility];  Service: Gynecology;  Laterality: Bilateral;   TEAR DUCT PROBING Left    TONSILLECTOMY     UMBILICAL HERNIA REPAIR      Allergies  Allergen Reactions   Avelox [Moxifloxacin Hcl In Nacl] Shortness Of Breath and Swelling   Moxifloxacin Other (See Comments), Shortness Of Breath and Swelling    other   Aricept [Donepezil  Hcl] Diarrhea   Codeine Swelling        Donepezil Diarrhea   Garlic Diarrhea   Latex Hives, Itching, Rash and Other (See Comments)    Watery blisters    Onion Diarrhea   Sulfa Drugs Cross Reactors Swelling   Duloxetine Rash   Polysporin [Bacitracin-Polymyxin B] Other (See Comments)    other    Outpatient Encounter Medications as of 05/19/2019  Medication Sig   acetaminophen (TYLENOL) 500 MG tablet Take 500 mg by mouth every 6 (six) hours.    amiodarone (PACERONE) 100 MG tablet Take 1 tablet (100 mg total) by mouth daily.   aspirin (BAYER ASPIRIN) 325 MG tablet Take 1 tablet (325 mg total) by mouth daily.   atorvastatin (LIPITOR) 10 MG tablet Take 5 mg by mouth daily.    calcium carbonate (OS-CAL) 1250 (500 Ca) MG chewable tablet Chew 1 tablet by mouth daily.   cetirizine (ZYRTEC) 10 MG tablet Take 10 mg by mouth daily with breakfast.    cholecalciferol (  VITAMIN D) 1000 UNITS tablet Take 2,000 Units by mouth daily.    colestipol (COLESTID) 1 g tablet Take 1 g by mouth 3 (three) times daily.   Cyanocobalamin (B-12) 1000 MCG TABS Take 1,000 mcg by mouth daily.    docusate sodium (COLACE) 100 MG capsule Take 100 mg by mouth daily.   famotidine (PEPCID) 20 MG tablet Take 20 mg by mouth daily.    ferrous sulfate 325 (65 FE) MG tablet Take 325 mg by mouth daily.   fluticasone (FLONASE) 50 MCG/ACT nasal spray Place 1 spray into both nostrils daily.   gabapentin (NEURONTIN) 300 MG capsule Take 300-900 mg by mouth See admin instructions. 300 mg three times a day and 900 mg at bedtime   HYDROcodone-acetaminophen (NORCO/VICODIN) 5-325 MG tablet Take 1 tablet by mouth every 6 (six) hours as needed for moderate pain. For pain x 14 days.   levETIRAcetam (KEPPRA) 250 MG tablet Take 125-250 mg by mouth See admin instructions. Take 1/2 tablet by mouth in the morning and 2 tablets by mouth in the evening.   loperamide (IMODIUM) 2 MG capsule Take 2 mg by mouth daily as needed for  diarrhea or loose stools.   memantine (NAMENDA) 10 MG tablet Take 10 mg by mouth 2 (two) times daily.   mirtazapine (REMERON) 7.5 MG tablet Take 7.5 mg by mouth at bedtime.   Multiple Vitamins-Minerals (MULTIVITAMIN WITH MINERALS) tablet Take 1 tablet by mouth daily.   Nutritional Supplements (BOOST PLUS PO) Take 1 Container by mouth daily with lunch.   pantoprazole (PROTONIX) 40 MG tablet Take 40 mg by mouth at bedtime.    No facility-administered encounter medications on file as of 05/19/2019.     Review of Systems  Constitutional: Negative.   HENT: Negative.   Respiratory: Negative.   Cardiovascular: Positive for leg swelling.  Gastrointestinal: Positive for diarrhea.  Genitourinary: Negative.   Musculoskeletal: Positive for gait problem and joint swelling.  Neurological: Positive for weakness.  Psychiatric/Behavioral: Negative.   All other systems reviewed and are negative.   Immunization History  Administered Date(s) Administered   Influenza, High Dose Seasonal PF 06/13/2017   Zoster Recombinat (Shingrix) 02/13/2018, 06/05/2018   Pertinent  Health Maintenance Due  Topic Date Due   DEXA SCAN  05/16/1989   PNA vac Low Risk Adult (1 of 2 - PCV13) 05/16/1989   INFLUENZA VACCINE  03/21/2019   Fall Risk  04/17/2018 01/27/2016  Falls in the past year? Yes No  Number falls in past yr: 1 -  Injury with Fall? Yes -  Risk for fall due to : Impaired balance/gait -   Functional Status Survey:    Vitals:   05/19/19 0933  BP: 114/64  Pulse: 74  Resp: 20  Temp: 98.2 F (36.8 C)  SpO2: (!) 20%  Weight: 119 lb 9.6 oz (54.3 kg)  Height: 5\' 2"  (1.575 m)   Body mass index is 21.88 kg/m. Physical Exam Vitals signs reviewed.  HENT:     Head: Normocephalic.     Nose: Nose normal.     Mouth/Throat:     Mouth: Mucous membranes are moist.     Pharynx: Oropharynx is clear.  Eyes:     Pupils: Pupils are equal, round, and reactive to light.  Neck:     Musculoskeletal:  Neck supple.  Cardiovascular:     Rate and Rhythm: Normal rate and regular rhythm.     Pulses: Normal pulses.     Heart sounds: Murmur present.  Pulmonary:  Effort: Pulmonary effort is normal. No respiratory distress.     Breath sounds: Normal breath sounds. No wheezing.  Abdominal:     General: Abdomen is flat. Bowel sounds are normal.     Palpations: Abdomen is soft.  Musculoskeletal:     Comments: Has Bilateral Swelling Left More then right Has Thick Callus on Right Foot  Skin:    General: Skin is warm.  Neurological:     General: No focal deficit present.     Mental Status: She is alert and oriented to person, place, and time.  Psychiatric:        Mood and Affect: Mood normal.        Thought Content: Thought content normal.     Labs reviewed: Recent Labs    05/03/19 0429 05/04/19 0505 05/12/19 05/12/19 2246  NA 134* 136 140 136  K 4.4 4.6 4.8 3.7  CL 100 100 102 98  CO2 24 29 31 28   GLUCOSE 132* 93  --  116*  BUN 19 19 19 22   CREATININE 0.67 0.66 0.6 0.64  CALCIUM 8.5* 8.5* 8.6 9.2   Recent Labs    01/26/19 1459 02/08/19 1940 05/02/19 0338  AST 24 25 25   ALT 15 16 18   ALKPHOS 65 67 59  BILITOT 0.3 0.7 0.8  PROT 7.0 7.4 7.9  ALBUMIN 4.5 4.0 4.2   Recent Labs    02/08/19 1940  05/04/19 0505 05/05/19 0358 05/12/19 05/12/19 2246 05/14/19  WBC 5.5   < > 8.1 8.2 9.9 13.9* 11.8  NEUTROABS 4.0  --   --   --   --  10.1*  --   HGB 13.9   < > 8.0* 7.8* 6.5* 7.1* 9.2*  HCT 42.0   < > 24.6* 24.2* 20* 22.9* 30*  MCV 94.2   < > 97.2 96.8  --  101.3*  --   PLT 219   < > 132* 167 389 499* 467*   < > = values in this interval not displayed.   Lab Results  Component Value Date   TSH 3.180 01/26/2019   Lab Results  Component Value Date   HGBA1C 5.6 09/13/2014   Lab Results  Component Value Date   CHOL 111 12/11/2016   HDL 66 12/11/2016   LDLCALC 31 12/11/2016   TRIG 71 12/11/2016   CHOLHDL 1.7 12/11/2016    Significant Diagnostic Results in last  30 days:  Dg Chest 1 View  Result Date: 05/01/2019 CLINICAL DATA:  85 year female with left hip fracture. Preop evaluation. EXAM: CHEST  1 VIEW COMPARISON:  Chest radiograph dated 12/17/2017 FINDINGS: No focal consolidation, pleural effusion, pneumothorax. Borderline cardiomegaly. Calcification of the mitral annulus as well as atherosclerotic calcification of the aorta. No acute osseous pathology. Degenerative changes of spine. IMPRESSION: No active disease. Electronically Signed   By: Anner Crete M.D.   On: 05/01/2019 22:41   Dg Pelvis 1-2 Views  Result Date: 05/01/2019 CLINICAL DATA:  85 year old female with fall and left lower extremity pain. EXAM: PELVIS - 1-2 VIEW; LEFT FEMUR 2 VIEWS COMPARISON:  CT of the abdomen pelvis dated 02/08/2019 FINDINGS: Evaluation is limited due to positioning of the hips. There is a nondisplaced fracture of the left femoral neck. There is no dislocation. The bones are osteopenic. Degenerative changes of the hips and SI joints. IMPRESSION: Nondisplaced fracture of the left femoral neck. Electronically Signed   By: Anner Crete M.D.   On: 05/01/2019 22:38   Ct Head Wo Contrast  Result Date: 05/01/2019 CLINICAL DATA:  Fall EXAM: CT HEAD WITHOUT CONTRAST CT CERVICAL SPINE WITHOUT CONTRAST TECHNIQUE: Multidetector CT imaging of the head and cervical spine was performed following the standard protocol without intravenous contrast. Multiplanar CT image reconstructions of the cervical spine were also generated. COMPARISON:  None. FINDINGS: CT HEAD FINDINGS Brain: There is no mass, hemorrhage or extra-axial collection. There is generalized atrophy without lobar predilection. There is hypoattenuation of the periventricular white matter, most commonly indicating chronic ischemic microangiopathy. Vascular: No abnormal hyperdensity of the major intracranial arteries or dural venous sinuses. No intracranial atherosclerosis. Skull: The visualized skull base, calvarium  and extracranial soft tissues are normal. Sinuses/Orbits: There are secretions within both maxillary sinuses. The right frontal sinus is opacified. Partial opacification of the ethmoid air cells. No mastoid or middle ear effusion. The orbits are normal. CT CERVICAL SPINE FINDINGS Alignment: There is grade 1 anterolisthesis at C5-6, likely due to facet arthrosis. Skull base and vertebrae: There is no fracture. Soft tissues and spinal canal: No prevertebral fluid or swelling. No visible canal hematoma. Disc levels: There is severe multilevel facet arthrosis. The right facets are fused at C2-C4. Disc degeneration is greatest at C5-7. No bony spinal canal stenosis. Upper chest: Biapical emphysema. Other: Normal visualized paraspinal cervical soft tissues. IMPRESSION: 1. Chronic ischemic microangiopathy and generalized atrophy without acute intracranial abnormality. 2. No acute fracture of the cervical spine. Electronically Signed   By: Ulyses Jarred M.D.   On: 05/01/2019 23:37   Ct Cervical Spine Wo Contrast  Result Date: 05/01/2019 CLINICAL DATA:  Fall EXAM: CT HEAD WITHOUT CONTRAST CT CERVICAL SPINE WITHOUT CONTRAST TECHNIQUE: Multidetector CT imaging of the head and cervical spine was performed following the standard protocol without intravenous contrast. Multiplanar CT image reconstructions of the cervical spine were also generated. COMPARISON:  None. FINDINGS: CT HEAD FINDINGS Brain: There is no mass, hemorrhage or extra-axial collection. There is generalized atrophy without lobar predilection. There is hypoattenuation of the periventricular white matter, most commonly indicating chronic ischemic microangiopathy. Vascular: No abnormal hyperdensity of the major intracranial arteries or dural venous sinuses. No intracranial atherosclerosis. Skull: The visualized skull base, calvarium and extracranial soft tissues are normal. Sinuses/Orbits: There are secretions within both maxillary sinuses. The right frontal  sinus is opacified. Partial opacification of the ethmoid air cells. No mastoid or middle ear effusion. The orbits are normal. CT CERVICAL SPINE FINDINGS Alignment: There is grade 1 anterolisthesis at C5-6, likely due to facet arthrosis. Skull base and vertebrae: There is no fracture. Soft tissues and spinal canal: No prevertebral fluid or swelling. No visible canal hematoma. Disc levels: There is severe multilevel facet arthrosis. The right facets are fused at C2-C4. Disc degeneration is greatest at C5-7. No bony spinal canal stenosis. Upper chest: Biapical emphysema. Other: Normal visualized paraspinal cervical soft tissues. IMPRESSION: 1. Chronic ischemic microangiopathy and generalized atrophy without acute intracranial abnormality. 2. No acute fracture of the cervical spine. Electronically Signed   By: Ulyses Jarred M.D.   On: 05/01/2019 23:37   Pelvis Portable  Result Date: 05/02/2019 CLINICAL DATA:  Post left hip arthroplasty. EXAM: PORTABLE PELVIS 1-2 VIEWS COMPARISON:  05/01/2019 FINDINGS: Mild diffuse osteopenia. Evidence of patient's recent left hip arthroplasty which is intact and normally located. Skin staples over the lateral soft tissues of the left hip. Remainder of the exam is unchanged. IMPRESSION: Expected changes post left hip arthroplasty. Electronically Signed   By: Marin Olp M.D.   On: 05/02/2019 16:44   Dg Femur Min 2  Views Left  Result Date: 05/01/2019 CLINICAL DATA:  85 year old female with fall and left lower extremity pain. EXAM: PELVIS - 1-2 VIEW; LEFT FEMUR 2 VIEWS COMPARISON:  CT of the abdomen pelvis dated 02/08/2019 FINDINGS: Evaluation is limited due to positioning of the hips. There is a nondisplaced fracture of the left femoral neck. There is no dislocation. The bones are osteopenic. Degenerative changes of the hips and SI joints. IMPRESSION: Nondisplaced fracture of the left femoral neck. Electronically Signed   By: Anner Crete M.D.   On: 05/01/2019 22:38     Assessment/Plan Chronic GI bleeding Change Full aspirin to Baby aspirin On Protonix Will need follow up with GI as outpatient Repeat CBC  Anemia S/P TransfusionOn Iron Repeat CBC Addendum Showed HGB 8.9  Chronic diarrhea Doing better on Colestipol  PAF (paroxysmal atrial fibrillation) (HCC) No Anticoagulation due to  GI bleed and Fall risk  Rate control. Continue on Amiodarone  Valvular heart disease Not Hypoxic anymore Taper Oxygen  History of total left hip arthroplasty WBAT On Norco Prn for Pain Will work with therapy Follow up with Ortho Polyneuropathy On High Doses of Neurontin Will reduce the dose as per Pharmacy Son has agreed 300 mg TID and 300 mg QHS Also On Keppra Right Foot Callus Not Infected Podiatry Consult    Family/ staff Communication:   Labs/tests ordered:  CBC  Total time spent in this patient care encounter was  _25  minutes; greater than 50% of the visit spent counseling patient and staff, reviewing records , Labs and coordinating care for problems addressed at this encounter.

## 2019-05-19 NOTE — Telephone Encounter (Signed)
Received fax refill order from [facility] and sent to Dr. Lyndel Safe for approval.

## 2019-05-20 ENCOUNTER — Encounter: Payer: Self-pay | Admitting: Surgery

## 2019-05-20 ENCOUNTER — Encounter: Payer: Self-pay | Admitting: Internal Medicine

## 2019-05-20 ENCOUNTER — Ambulatory Visit: Payer: Self-pay

## 2019-05-20 ENCOUNTER — Ambulatory Visit (INDEPENDENT_AMBULATORY_CARE_PROVIDER_SITE_OTHER): Payer: Medicare Other | Admitting: Surgery

## 2019-05-20 VITALS — BP 98/61 | HR 63 | Ht 62.0 in | Wt 119.6 lb

## 2019-05-20 DIAGNOSIS — M25552 Pain in left hip: Secondary | ICD-10-CM
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
## 2019-05-21 ENCOUNTER — Other Ambulatory Visit: Payer: Self-pay | Admitting: Internal Medicine

## 2019-05-21 LAB — BASIC METABOLIC PANEL
BUN: 13 (ref 4–21)
CO2: 32 — AB (ref 13–22)
Chloride: 104 (ref 99–108)
Creatinine: 0.6 (ref 0.5–1.1)
Glucose: 85
Potassium: 4.4 (ref 3.4–5.3)
Sodium: 142 (ref 137–147)

## 2019-05-21 LAB — CBC AND DIFFERENTIAL
HCT: 28 — AB (ref 36–46)
Hemoglobin: 8.9 — AB (ref 12.0–16.0)
Neutrophils Absolute: 6449
Platelets: 419 — AB (ref 150–399)
WBC: 9.2

## 2019-05-21 LAB — CBC: RBC: 2.95 — AB (ref 3.87–5.11)

## 2019-05-21 LAB — COMPREHENSIVE METABOLIC PANEL: Calcium: 9.1 (ref 8.7–10.7)

## 2019-05-21 MED ORDER — HYDROCODONE-ACETAMINOPHEN 5-325 MG PO TABS: 1.0000 | ORAL_TABLET | Freq: Four times a day (QID) | ORAL | 0 refills | Status: AC | PRN

## 2019-05-25 ENCOUNTER — Telehealth: Payer: Self-pay | Admitting: *Deleted

## 2019-05-25 NOTE — Telephone Encounter (Signed)
Becky with [employer] called and stated that patient has NO Opiate History so the Rx that was written by Dr. Lyndel Safe she cannot fill for a 30 day supply.  Stated that they only provide 7 days worth #28 for the first time and then you can write a 30 day supply after that.   Stated that they need a new Rx sent to them for ONLY #28 and then you can sent a 30 day supply.   Please Advise.   [employer] [phone] Reference #: [id number]

## 2019-05-26 NOTE — Telephone Encounter (Signed)
I wrote order and gave it to Nurse. Hopefully it will work this time

## 2019-05-28 LAB — CBC AND DIFFERENTIAL
HCT: 30 — AB (ref 36–46)
Hemoglobin: 9.8 — AB (ref 12.0–16.0)
Neutrophils Absolute: 3597
Platelets: 367 (ref 150–399)
WBC: 6.8

## 2019-05-28 LAB — CBC: RBC: 3.3 — AB (ref 3.87–5.11)

## 2019-06-08 ENCOUNTER — Non-Acute Institutional Stay (SKILLED_NURSING_FACILITY): Payer: Medicare Other | Admitting: Nurse Practitioner

## 2019-06-08 ENCOUNTER — Encounter: Payer: Self-pay | Admitting: Nurse Practitioner

## 2019-06-08 DIAGNOSIS — I2510 Atherosclerotic heart disease of native coronary artery without angina pectoris: Secondary | ICD-10-CM

## 2019-06-08 DIAGNOSIS — I480 Paroxysmal atrial fibrillation: Secondary | ICD-10-CM

## 2019-06-08 DIAGNOSIS — S72002S Fracture of unspecified part of neck of left femur, sequela: Secondary | ICD-10-CM | POA: Diagnosis not present

## 2019-06-08 DIAGNOSIS — D62 Acute posthemorrhagic anemia: Secondary | ICD-10-CM | POA: Diagnosis not present

## 2019-06-08 DIAGNOSIS — D649 Anemia, unspecified: Secondary | ICD-10-CM | POA: Diagnosis not present

## 2019-06-08 DIAGNOSIS — I10 Essential (primary) hypertension: Secondary | ICD-10-CM | POA: Diagnosis not present

## 2019-06-08 DIAGNOSIS — G63 Polyneuropathy in diseases classified elsewhere: Secondary | ICD-10-CM

## 2019-06-08 DIAGNOSIS — K219 Gastro-esophageal reflux disease without esophagitis: Secondary | ICD-10-CM | POA: Diagnosis not present

## 2019-06-08 DIAGNOSIS — R413 Other amnesia: Secondary | ICD-10-CM

## 2019-06-08 DIAGNOSIS — K922 Gastrointestinal hemorrhage, unspecified: Secondary | ICD-10-CM | POA: Diagnosis not present

## 2019-06-08 DIAGNOSIS — R269 Unspecified abnormalities of gait and mobility: Secondary | ICD-10-CM

## 2019-06-08 NOTE — Assessment & Plan Note (Signed)
Blood pressure is controlled

## 2019-06-08 NOTE — Assessment & Plan Note (Signed)
Stabilized, continue Pantoprazole 40mg  qd, Famotidine 20mg  qd.

## 2019-06-08 NOTE — Assessment & Plan Note (Addendum)
Continue [facility] for safety, care assistance, continue Memantine 10mg  bid. Her weight is stable, continue Mirtazapine 7.5mg  qd.

## 2019-06-08 NOTE — Assessment & Plan Note (Signed)
Stable, continue ASA 81mg  qd, Atorvastatin 5mg  qd.
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
## 2019-06-08 NOTE — Assessment & Plan Note (Signed)
F/u GI 

## 2019-06-08 NOTE — Assessment & Plan Note (Signed)
Last Hgb 9.8 05/28/19, continue Fe 325mg  qd.

## 2019-06-08 NOTE — Assessment & Plan Note (Addendum)
Heart rate is in control, continue Amiodarone 100mg  qd. No a candidate for anticoagulation therapy due to falling and Hx of GI bleed.

## 2019-06-08 NOTE — Assessment & Plan Note (Signed)
Managed, stable, chronic gait abnormality, continue Gabapentin 300mg  qid, Keppra 250mg  qd.

## 2019-06-08 NOTE — Assessment & Plan Note (Signed)
Healed, continue prn Norco for pain control.

## 2019-06-08 NOTE — Assessment & Plan Note (Signed)
Continue ambulating with walker.  

## 2019-06-08 NOTE — Assessment & Plan Note (Signed)
Stabilized, continue Fe 325mg  qd, last Hgb 9.8 05/28/19

## 2019-06-10 DIAGNOSIS — I10 Essential (primary) hypertension: Secondary | ICD-10-CM | POA: Diagnosis not present

## 2019-06-10 DIAGNOSIS — R29898 Other symptoms and signs involving the musculoskeletal system: Secondary | ICD-10-CM | POA: Diagnosis not present

## 2019-06-10 DIAGNOSIS — M6281 Muscle weakness (generalized): Secondary | ICD-10-CM | POA: Diagnosis not present

## 2019-06-10 DIAGNOSIS — Z20828 Contact with and (suspected) exposure to other viral communicable diseases: Secondary | ICD-10-CM | POA: Diagnosis not present

## 2019-06-10 DIAGNOSIS — R2681 Unsteadiness on feet: Secondary | ICD-10-CM | POA: Diagnosis not present

## 2019-06-10 DIAGNOSIS — I272 Pulmonary hypertension, unspecified: Secondary | ICD-10-CM | POA: Diagnosis not present

## 2019-06-10 DIAGNOSIS — I2510 Atherosclerotic heart disease of native coronary artery without angina pectoris: Secondary | ICD-10-CM | POA: Diagnosis not present

## 2019-06-10 DIAGNOSIS — S72042D Displaced fracture of base of neck of left femur, subsequent encounter for closed fracture with routine healing: Secondary | ICD-10-CM | POA: Diagnosis not present

## 2019-06-10 DIAGNOSIS — K219 Gastro-esophageal reflux disease without esophagitis: Secondary | ICD-10-CM | POA: Diagnosis not present

## 2019-06-10 DIAGNOSIS — I480 Paroxysmal atrial fibrillation: Secondary | ICD-10-CM | POA: Diagnosis not present

## 2019-06-11 DIAGNOSIS — I2510 Atherosclerotic heart disease of native coronary artery without angina pectoris: Secondary | ICD-10-CM | POA: Diagnosis not present

## 2019-06-11 DIAGNOSIS — R2681 Unsteadiness on feet: Secondary | ICD-10-CM | POA: Diagnosis not present

## 2019-06-11 DIAGNOSIS — R29898 Other symptoms and signs involving the musculoskeletal system: Secondary | ICD-10-CM | POA: Diagnosis not present

## 2019-06-11 DIAGNOSIS — I272 Pulmonary hypertension, unspecified: Secondary | ICD-10-CM | POA: Diagnosis not present

## 2019-06-11 DIAGNOSIS — S72042D Displaced fracture of base of neck of left femur, subsequent encounter for closed fracture with routine healing: Secondary | ICD-10-CM | POA: Diagnosis not present

## 2019-06-11 DIAGNOSIS — M6281 Muscle weakness (generalized): Secondary | ICD-10-CM | POA: Diagnosis not present

## 2019-06-12 DIAGNOSIS — I272 Pulmonary hypertension, unspecified: Secondary | ICD-10-CM | POA: Diagnosis not present

## 2019-06-12 DIAGNOSIS — M6281 Muscle weakness (generalized): Secondary | ICD-10-CM | POA: Diagnosis not present

## 2019-06-12 DIAGNOSIS — I2510 Atherosclerotic heart disease of native coronary artery without angina pectoris: Secondary | ICD-10-CM | POA: Diagnosis not present

## 2019-06-12 DIAGNOSIS — S72042D Displaced fracture of base of neck of left femur, subsequent encounter for closed fracture with routine healing: Secondary | ICD-10-CM | POA: Diagnosis not present

## 2019-06-12 DIAGNOSIS — R2681 Unsteadiness on feet: Secondary | ICD-10-CM | POA: Diagnosis not present

## 2019-06-12 DIAGNOSIS — R29898 Other symptoms and signs involving the musculoskeletal system: Secondary | ICD-10-CM | POA: Diagnosis not present

## 2019-06-15 DIAGNOSIS — I272 Pulmonary hypertension, unspecified: Secondary | ICD-10-CM | POA: Diagnosis not present

## 2019-06-15 DIAGNOSIS — R29898 Other symptoms and signs involving the musculoskeletal system: Secondary | ICD-10-CM | POA: Diagnosis not present

## 2019-06-15 DIAGNOSIS — I2510 Atherosclerotic heart disease of native coronary artery without angina pectoris: Secondary | ICD-10-CM | POA: Diagnosis not present

## 2019-06-15 DIAGNOSIS — S72042D Displaced fracture of base of neck of left femur, subsequent encounter for closed fracture with routine healing: Secondary | ICD-10-CM | POA: Diagnosis not present

## 2019-06-15 DIAGNOSIS — R2681 Unsteadiness on feet: Secondary | ICD-10-CM | POA: Diagnosis not present

## 2019-06-15 DIAGNOSIS — M6281 Muscle weakness (generalized): Secondary | ICD-10-CM | POA: Diagnosis not present

## 2019-06-16 DIAGNOSIS — M6281 Muscle weakness (generalized): Secondary | ICD-10-CM | POA: Diagnosis not present

## 2019-06-16 DIAGNOSIS — S72042D Displaced fracture of base of neck of left femur, subsequent encounter for closed fracture with routine healing: Secondary | ICD-10-CM | POA: Diagnosis not present

## 2019-06-16 DIAGNOSIS — R2681 Unsteadiness on feet: Secondary | ICD-10-CM | POA: Diagnosis not present

## 2019-06-16 DIAGNOSIS — R29898 Other symptoms and signs involving the musculoskeletal system: Secondary | ICD-10-CM | POA: Diagnosis not present

## 2019-06-16 DIAGNOSIS — I2510 Atherosclerotic heart disease of native coronary artery without angina pectoris: Secondary | ICD-10-CM | POA: Diagnosis not present

## 2019-06-16 DIAGNOSIS — I272 Pulmonary hypertension, unspecified: Secondary | ICD-10-CM | POA: Diagnosis not present

## 2019-06-17 ENCOUNTER — Ambulatory Visit: Payer: Medicare Other | Admitting: Surgery

## 2019-06-18 DIAGNOSIS — I272 Pulmonary hypertension, unspecified: Secondary | ICD-10-CM | POA: Diagnosis not present

## 2019-06-18 DIAGNOSIS — R29898 Other symptoms and signs involving the musculoskeletal system: Secondary | ICD-10-CM | POA: Diagnosis not present

## 2019-06-18 DIAGNOSIS — I2510 Atherosclerotic heart disease of native coronary artery without angina pectoris: Secondary | ICD-10-CM | POA: Diagnosis not present

## 2019-06-18 DIAGNOSIS — S72042D Displaced fracture of base of neck of left femur, subsequent encounter for closed fracture with routine healing: Secondary | ICD-10-CM | POA: Diagnosis not present

## 2019-06-18 DIAGNOSIS — R2681 Unsteadiness on feet: Secondary | ICD-10-CM | POA: Diagnosis not present

## 2019-06-18 DIAGNOSIS — M6281 Muscle weakness (generalized): Secondary | ICD-10-CM | POA: Diagnosis not present

## 2019-06-19 DIAGNOSIS — M6281 Muscle weakness (generalized): Secondary | ICD-10-CM | POA: Diagnosis not present

## 2019-06-19 DIAGNOSIS — I272 Pulmonary hypertension, unspecified: Secondary | ICD-10-CM | POA: Diagnosis not present

## 2019-06-19 DIAGNOSIS — S72042D Displaced fracture of base of neck of left femur, subsequent encounter for closed fracture with routine healing: Secondary | ICD-10-CM | POA: Diagnosis not present

## 2019-06-19 DIAGNOSIS — R29898 Other symptoms and signs involving the musculoskeletal system: Secondary | ICD-10-CM | POA: Diagnosis not present

## 2019-06-19 DIAGNOSIS — I2510 Atherosclerotic heart disease of native coronary artery without angina pectoris: Secondary | ICD-10-CM | POA: Diagnosis not present

## 2019-06-19 DIAGNOSIS — R2681 Unsteadiness on feet: Secondary | ICD-10-CM | POA: Diagnosis not present

## 2019-06-22 ENCOUNTER — Non-Acute Institutional Stay: Payer: Medicare Other | Admitting: Nurse Practitioner

## 2019-06-22 DIAGNOSIS — Z20828 Contact with and (suspected) exposure to other viral communicable diseases: Secondary | ICD-10-CM | POA: Diagnosis not present

## 2019-06-22 DIAGNOSIS — K219 Gastro-esophageal reflux disease without esophagitis: Secondary | ICD-10-CM | POA: Diagnosis not present

## 2019-06-22 DIAGNOSIS — I480 Paroxysmal atrial fibrillation: Secondary | ICD-10-CM | POA: Diagnosis not present

## 2019-06-22 DIAGNOSIS — L03115 Cellulitis of right lower limb: Secondary | ICD-10-CM

## 2019-06-22 DIAGNOSIS — S72042D Displaced fracture of base of neck of left femur, subsequent encounter for closed fracture with routine healing: Secondary | ICD-10-CM | POA: Diagnosis not present

## 2019-06-22 DIAGNOSIS — R29898 Other symptoms and signs involving the musculoskeletal system: Secondary | ICD-10-CM | POA: Diagnosis not present

## 2019-06-22 DIAGNOSIS — I4891 Unspecified atrial fibrillation: Secondary | ICD-10-CM | POA: Diagnosis not present

## 2019-06-22 DIAGNOSIS — I272 Pulmonary hypertension, unspecified: Secondary | ICD-10-CM | POA: Diagnosis not present

## 2019-06-22 DIAGNOSIS — G63 Polyneuropathy in diseases classified elsewhere: Secondary | ICD-10-CM

## 2019-06-22 DIAGNOSIS — I2510 Atherosclerotic heart disease of native coronary artery without angina pectoris: Secondary | ICD-10-CM | POA: Diagnosis not present

## 2019-06-22 DIAGNOSIS — M6281 Muscle weakness (generalized): Secondary | ICD-10-CM | POA: Diagnosis not present

## 2019-06-22 DIAGNOSIS — D62 Acute posthemorrhagic anemia: Secondary | ICD-10-CM

## 2019-06-22 DIAGNOSIS — R413 Other amnesia: Secondary | ICD-10-CM | POA: Diagnosis not present

## 2019-06-22 DIAGNOSIS — R2681 Unsteadiness on feet: Secondary | ICD-10-CM | POA: Diagnosis not present

## 2019-06-22 DIAGNOSIS — I10 Essential (primary) hypertension: Secondary | ICD-10-CM | POA: Diagnosis not present

## 2019-06-23 ENCOUNTER — Encounter: Payer: Self-pay | Admitting: Nurse Practitioner

## 2019-06-23 DIAGNOSIS — R29898 Other symptoms and signs involving the musculoskeletal system: Secondary | ICD-10-CM | POA: Diagnosis not present

## 2019-06-23 DIAGNOSIS — S72042D Displaced fracture of base of neck of left femur, subsequent encounter for closed fracture with routine healing: Secondary | ICD-10-CM | POA: Diagnosis not present

## 2019-06-23 DIAGNOSIS — I2510 Atherosclerotic heart disease of native coronary artery without angina pectoris: Secondary | ICD-10-CM | POA: Diagnosis not present

## 2019-06-23 DIAGNOSIS — L03115 Cellulitis of right lower limb: Secondary | ICD-10-CM | POA: Insufficient documentation

## 2019-06-23 DIAGNOSIS — M6281 Muscle weakness (generalized): Secondary | ICD-10-CM | POA: Diagnosis not present

## 2019-06-23 DIAGNOSIS — Z20828 Contact with and (suspected) exposure to other viral communicable diseases: Secondary | ICD-10-CM | POA: Diagnosis not present

## 2019-06-23 DIAGNOSIS — R2681 Unsteadiness on feet: Secondary | ICD-10-CM | POA: Diagnosis not present

## 2019-06-23 DIAGNOSIS — I10 Essential (primary) hypertension: Secondary | ICD-10-CM | POA: Diagnosis not present

## 2019-06-23 DIAGNOSIS — I272 Pulmonary hypertension, unspecified: Secondary | ICD-10-CM | POA: Diagnosis not present

## 2019-06-23 LAB — BASIC METABOLIC PANEL
BUN: 12 (ref 4–21)
CO2: 29 — AB (ref 13–22)
Chloride: 98 — AB (ref 99–108)
Creatinine: 0.6 (ref 0.5–1.1)
Glucose: 83
Potassium: 4.1 (ref 3.4–5.3)
Sodium: 135 — AB (ref 137–147)

## 2019-06-23 LAB — COMPREHENSIVE METABOLIC PANEL
Albumin: 3.6 (ref 3.5–5.0)
Calcium: 9.1 (ref 8.7–10.7)
Globulin: 2.9

## 2019-06-23 LAB — HEPATIC FUNCTION PANEL
ALT: 7 (ref 7–35)
AST: 15 (ref 13–35)
Alkaline Phosphatase: 52 (ref 25–125)
Bilirubin, Total: 0.3

## 2019-06-23 LAB — CBC AND DIFFERENTIAL
HCT: 33 — AB (ref 36–46)
Hemoglobin: 10.9 — AB (ref 12.0–16.0)
Neutrophils Absolute: 3121
Platelets: 247 (ref 150–399)
WBC: 5.1

## 2019-06-23 LAB — CBC: RBC: 3.68 — AB (ref 3.87–5.11)
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
## 2019-06-23 NOTE — Assessment & Plan Note (Signed)
Heart rate is in control, continue Amiodarone.  

## 2019-06-23 NOTE — Assessment & Plan Note (Signed)
Continue [facility] for safety, care assistance, continue Memantine 10mg  bid for memory.

## 2019-06-23 NOTE — Assessment & Plan Note (Signed)
Continue to symptomatic treatment, continue Gabapentin, Tylenol, Keppra.

## 2019-06-23 NOTE — Assessment & Plan Note (Signed)
Continue Pantoprazole, Famotidine.

## 2019-06-23 NOTE — Assessment & Plan Note (Signed)
Hgb 8.9 05/21/19, continue Fe, Vit B12.

## 2019-06-23 NOTE — Assessment & Plan Note (Signed)
Redness, warmth, swelling with a blood filled blister, scabbed over area in plantar aspect of the right 1st MTJ/ball part of the foot, will Doxycycline 100mg  bid x 10 days. Update CBC/diff, BMP, uric acid. Observe.

## 2019-06-24 DIAGNOSIS — R29898 Other symptoms and signs involving the musculoskeletal system: Secondary | ICD-10-CM | POA: Diagnosis not present

## 2019-06-24 DIAGNOSIS — S72042D Displaced fracture of base of neck of left femur, subsequent encounter for closed fracture with routine healing: Secondary | ICD-10-CM | POA: Diagnosis not present

## 2019-06-24 DIAGNOSIS — I2510 Atherosclerotic heart disease of native coronary artery without angina pectoris: Secondary | ICD-10-CM | POA: Diagnosis not present

## 2019-06-24 DIAGNOSIS — M6281 Muscle weakness (generalized): Secondary | ICD-10-CM | POA: Diagnosis not present

## 2019-06-24 DIAGNOSIS — I272 Pulmonary hypertension, unspecified: Secondary | ICD-10-CM | POA: Diagnosis not present

## 2019-06-24 DIAGNOSIS — R2681 Unsteadiness on feet: Secondary | ICD-10-CM | POA: Diagnosis not present

## 2019-06-25 ENCOUNTER — Ambulatory Visit: Payer: Medicare Other | Admitting: Surgery

## 2019-06-25 ENCOUNTER — Non-Acute Institutional Stay: Payer: Medicare Other | Admitting: Internal Medicine

## 2019-06-25 DIAGNOSIS — D62 Acute posthemorrhagic anemia: Secondary | ICD-10-CM | POA: Diagnosis not present

## 2019-06-25 DIAGNOSIS — R2681 Unsteadiness on feet: Secondary | ICD-10-CM | POA: Diagnosis not present

## 2019-06-25 DIAGNOSIS — G63 Polyneuropathy in diseases classified elsewhere: Secondary | ICD-10-CM | POA: Diagnosis not present

## 2019-06-25 DIAGNOSIS — I4891 Unspecified atrial fibrillation: Secondary | ICD-10-CM | POA: Diagnosis not present

## 2019-06-25 DIAGNOSIS — R29898 Other symptoms and signs involving the musculoskeletal system: Secondary | ICD-10-CM | POA: Diagnosis not present

## 2019-06-25 DIAGNOSIS — K922 Gastrointestinal hemorrhage, unspecified: Secondary | ICD-10-CM

## 2019-06-25 DIAGNOSIS — M6281 Muscle weakness (generalized): Secondary | ICD-10-CM | POA: Diagnosis not present

## 2019-06-25 DIAGNOSIS — I2510 Atherosclerotic heart disease of native coronary artery without angina pectoris: Secondary | ICD-10-CM | POA: Diagnosis not present

## 2019-06-25 DIAGNOSIS — I272 Pulmonary hypertension, unspecified: Secondary | ICD-10-CM | POA: Diagnosis not present

## 2019-06-25 DIAGNOSIS — S72042D Displaced fracture of base of neck of left femur, subsequent encounter for closed fracture with routine healing: Secondary | ICD-10-CM | POA: Diagnosis not present
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
## 2019-06-26 ENCOUNTER — Encounter: Payer: Self-pay | Admitting: Internal Medicine

## 2019-06-29 ENCOUNTER — Encounter: Payer: Self-pay | Admitting: Nurse Practitioner

## 2019-06-29 ENCOUNTER — Non-Acute Institutional Stay: Payer: Medicare Other | Admitting: Nurse Practitioner

## 2019-06-29 DIAGNOSIS — L03115 Cellulitis of right lower limb: Secondary | ICD-10-CM | POA: Diagnosis not present

## 2019-06-29 DIAGNOSIS — I10 Essential (primary) hypertension: Secondary | ICD-10-CM | POA: Diagnosis not present

## 2019-06-29 DIAGNOSIS — R413 Other amnesia: Secondary | ICD-10-CM | POA: Diagnosis not present

## 2019-06-29 DIAGNOSIS — K219 Gastro-esophageal reflux disease without esophagitis: Secondary | ICD-10-CM

## 2019-06-29 DIAGNOSIS — I4891 Unspecified atrial fibrillation: Secondary | ICD-10-CM | POA: Diagnosis not present

## 2019-06-29 DIAGNOSIS — K922 Gastrointestinal hemorrhage, unspecified: Secondary | ICD-10-CM | POA: Diagnosis not present

## 2019-06-29 DIAGNOSIS — K921 Melena: Secondary | ICD-10-CM

## 2019-06-29 DIAGNOSIS — D500 Iron deficiency anemia secondary to blood loss (chronic): Secondary | ICD-10-CM | POA: Diagnosis not present

## 2019-06-29 NOTE — Assessment & Plan Note (Signed)
Continue Fe 325mg  qd, Vit B12 1071mcg qd, her baseline Hgb 9-10s.

## 2019-06-29 NOTE — Assessment & Plan Note (Signed)
06/29/19 reported nausea, abd discomfort, "blood in stool" x 2 days, pending CBC, hold ASA x 3 days, dc Famotidine, start Pantoprazole for GI protection.

## 2019-06-29 NOTE — Assessment & Plan Note (Signed)
Change Protonix from Famotidine for GI protection.

## 2019-06-29 NOTE — Assessment & Plan Note (Signed)
Needs GI f/u 

## 2019-06-29 NOTE — Assessment & Plan Note (Signed)
Improved, complete 10 day course of Doxy

## 2019-06-29 NOTE — Assessment & Plan Note (Signed)
Continue AL [facility] for safety, care assistance, continue Memantine for memory.  
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
## 2019-06-29 NOTE — Assessment & Plan Note (Signed)
Heart rate is in control, continue Amiodarone 100mg qd.  

## 2019-06-30 DIAGNOSIS — I272 Pulmonary hypertension, unspecified: Secondary | ICD-10-CM | POA: Diagnosis not present

## 2019-06-30 DIAGNOSIS — R29898 Other symptoms and signs involving the musculoskeletal system: Secondary | ICD-10-CM | POA: Diagnosis not present

## 2019-06-30 DIAGNOSIS — S72042D Displaced fracture of base of neck of left femur, subsequent encounter for closed fracture with routine healing: Secondary | ICD-10-CM | POA: Diagnosis not present

## 2019-06-30 DIAGNOSIS — M6281 Muscle weakness (generalized): Secondary | ICD-10-CM | POA: Diagnosis not present

## 2019-06-30 DIAGNOSIS — Z03818 Encounter for observation for suspected exposure to other biological agents ruled out: Secondary | ICD-10-CM | POA: Diagnosis not present

## 2019-06-30 DIAGNOSIS — R2681 Unsteadiness on feet: Secondary | ICD-10-CM | POA: Diagnosis not present

## 2019-06-30 DIAGNOSIS — I2510 Atherosclerotic heart disease of native coronary artery without angina pectoris: Secondary | ICD-10-CM | POA: Diagnosis not present

## 2019-07-01 ENCOUNTER — Ambulatory Visit: Payer: Medicare Other | Admitting: Surgery

## 2019-07-02 DIAGNOSIS — S72042D Displaced fracture of base of neck of left femur, subsequent encounter for closed fracture with routine healing: Secondary | ICD-10-CM | POA: Diagnosis not present

## 2019-07-02 DIAGNOSIS — I2510 Atherosclerotic heart disease of native coronary artery without angina pectoris: Secondary | ICD-10-CM | POA: Diagnosis not present

## 2019-07-02 DIAGNOSIS — R2681 Unsteadiness on feet: Secondary | ICD-10-CM | POA: Diagnosis not present

## 2019-07-02 DIAGNOSIS — R29898 Other symptoms and signs involving the musculoskeletal system: Secondary | ICD-10-CM | POA: Diagnosis not present

## 2019-07-02 DIAGNOSIS — M6281 Muscle weakness (generalized): Secondary | ICD-10-CM | POA: Diagnosis not present

## 2019-07-02 DIAGNOSIS — I272 Pulmonary hypertension, unspecified: Secondary | ICD-10-CM | POA: Diagnosis not present

## 2019-07-03 DIAGNOSIS — I2510 Atherosclerotic heart disease of native coronary artery without angina pectoris: Secondary | ICD-10-CM | POA: Diagnosis not present

## 2019-07-03 DIAGNOSIS — R2681 Unsteadiness on feet: Secondary | ICD-10-CM | POA: Diagnosis not present

## 2019-07-03 DIAGNOSIS — M6281 Muscle weakness (generalized): Secondary | ICD-10-CM | POA: Diagnosis not present

## 2019-07-03 DIAGNOSIS — I272 Pulmonary hypertension, unspecified: Secondary | ICD-10-CM | POA: Diagnosis not present

## 2019-07-03 DIAGNOSIS — S72042D Displaced fracture of base of neck of left femur, subsequent encounter for closed fracture with routine healing: Secondary | ICD-10-CM | POA: Diagnosis not present

## 2019-07-03 DIAGNOSIS — R29898 Other symptoms and signs involving the musculoskeletal system: Secondary | ICD-10-CM | POA: Diagnosis not present

## 2019-07-06 DIAGNOSIS — M6281 Muscle weakness (generalized): Secondary | ICD-10-CM | POA: Diagnosis not present

## 2019-07-06 DIAGNOSIS — S72042D Displaced fracture of base of neck of left femur, subsequent encounter for closed fracture with routine healing: Secondary | ICD-10-CM | POA: Diagnosis not present

## 2019-07-06 DIAGNOSIS — R2681 Unsteadiness on feet: Secondary | ICD-10-CM | POA: Diagnosis not present

## 2019-07-06 DIAGNOSIS — I272 Pulmonary hypertension, unspecified: Secondary | ICD-10-CM | POA: Diagnosis not present

## 2019-07-06 DIAGNOSIS — I2510 Atherosclerotic heart disease of native coronary artery without angina pectoris: Secondary | ICD-10-CM | POA: Diagnosis not present

## 2019-07-06 DIAGNOSIS — R29898 Other symptoms and signs involving the musculoskeletal system: Secondary | ICD-10-CM | POA: Diagnosis not present

## 2019-07-07 DIAGNOSIS — S72042D Displaced fracture of base of neck of left femur, subsequent encounter for closed fracture with routine healing: Secondary | ICD-10-CM | POA: Diagnosis not present

## 2019-07-07 DIAGNOSIS — I272 Pulmonary hypertension, unspecified: Secondary | ICD-10-CM | POA: Diagnosis not present

## 2019-07-07 DIAGNOSIS — R29898 Other symptoms and signs involving the musculoskeletal system: Secondary | ICD-10-CM | POA: Diagnosis not present

## 2019-07-07 DIAGNOSIS — M6281 Muscle weakness (generalized): Secondary | ICD-10-CM | POA: Diagnosis not present

## 2019-07-07 DIAGNOSIS — Z03818 Encounter for observation for suspected exposure to other biological agents ruled out: Secondary | ICD-10-CM | POA: Diagnosis not present

## 2019-07-07 DIAGNOSIS — I2510 Atherosclerotic heart disease of native coronary artery without angina pectoris: Secondary | ICD-10-CM | POA: Diagnosis not present

## 2019-07-07 DIAGNOSIS — R2681 Unsteadiness on feet: Secondary | ICD-10-CM | POA: Diagnosis not present

## 2019-07-08 ENCOUNTER — Ambulatory Visit: Payer: Self-pay

## 2019-07-08 ENCOUNTER — Ambulatory Visit (INDEPENDENT_AMBULATORY_CARE_PROVIDER_SITE_OTHER): Payer: Medicare Other | Admitting: Surgery

## 2019-07-08 ENCOUNTER — Other Ambulatory Visit: Payer: Self-pay

## 2019-07-08 ENCOUNTER — Encounter: Payer: Self-pay | Admitting: Surgery

## 2019-07-08 DIAGNOSIS — S72042D Displaced fracture of base of neck of left femur, subsequent encounter for closed fracture with routine healing: Secondary | ICD-10-CM | POA: Diagnosis not present

## 2019-07-08 DIAGNOSIS — I2510 Atherosclerotic heart disease of native coronary artery without angina pectoris: Secondary | ICD-10-CM | POA: Diagnosis not present

## 2019-07-08 DIAGNOSIS — M25552 Pain in left hip: Secondary | ICD-10-CM

## 2019-07-08 DIAGNOSIS — I272 Pulmonary hypertension, unspecified: Secondary | ICD-10-CM | POA: Diagnosis not present

## 2019-07-08 DIAGNOSIS — M6281 Muscle weakness (generalized): Secondary | ICD-10-CM | POA: Diagnosis not present

## 2019-07-08 DIAGNOSIS — R2681 Unsteadiness on feet: Secondary | ICD-10-CM | POA: Diagnosis not present

## 2019-07-08 DIAGNOSIS — R29898 Other symptoms and signs involving the musculoskeletal system: Secondary | ICD-10-CM | POA: Diagnosis not present

## 2019-07-08 DIAGNOSIS — Z09 Encounter for follow-up examination after completed treatment for conditions other than malignant neoplasm: Secondary | ICD-10-CM

## 2019-07-08 NOTE — Progress Notes (Signed)
85 year old female who is about 2 months status post left hip hemiarthroplasty returns.  States that she continues to do well and is pleased up to this point.  She is ambulating with a walker.  Continues to work with PT.  Exam Patient's son is present with her today.  Neurologically intact.  Patient alert and oriented.  Plan She will continue to work with PT for gait training and strengthening.  Follow-up with Dr. Lorin Mercy in 6 weeks for recheck.  Anticipate release at that time.

## 2019-07-09 DIAGNOSIS — M6281 Muscle weakness (generalized): Secondary | ICD-10-CM | POA: Diagnosis not present

## 2019-07-09 DIAGNOSIS — I2510 Atherosclerotic heart disease of native coronary artery without angina pectoris: Secondary | ICD-10-CM | POA: Diagnosis not present

## 2019-07-09 DIAGNOSIS — I272 Pulmonary hypertension, unspecified: Secondary | ICD-10-CM | POA: Diagnosis not present

## 2019-07-09 DIAGNOSIS — R2681 Unsteadiness on feet: Secondary | ICD-10-CM | POA: Diagnosis not present

## 2019-07-09 DIAGNOSIS — S72042D Displaced fracture of base of neck of left femur, subsequent encounter for closed fracture with routine healing: Secondary | ICD-10-CM | POA: Diagnosis not present

## 2019-07-09 DIAGNOSIS — R29898 Other symptoms and signs involving the musculoskeletal system: Secondary | ICD-10-CM | POA: Diagnosis not present

## 2019-07-14 DIAGNOSIS — R29898 Other symptoms and signs involving the musculoskeletal system: Secondary | ICD-10-CM | POA: Diagnosis not present

## 2019-07-14 DIAGNOSIS — R2681 Unsteadiness on feet: Secondary | ICD-10-CM | POA: Diagnosis not present

## 2019-07-14 DIAGNOSIS — S72042D Displaced fracture of base of neck of left femur, subsequent encounter for closed fracture with routine healing: Secondary | ICD-10-CM | POA: Diagnosis not present

## 2019-07-14 DIAGNOSIS — M6281 Muscle weakness (generalized): Secondary | ICD-10-CM | POA: Diagnosis not present

## 2019-07-20 ENCOUNTER — Encounter: Payer: Self-pay | Admitting: Internal Medicine

## 2019-07-20 ENCOUNTER — Non-Acute Institutional Stay: Payer: Medicare Other | Admitting: Internal Medicine

## 2019-07-20 DIAGNOSIS — G63 Polyneuropathy in diseases classified elsewhere: Secondary | ICD-10-CM | POA: Diagnosis not present

## 2019-07-20 DIAGNOSIS — K529 Noninfective gastroenteritis and colitis, unspecified: Secondary | ICD-10-CM | POA: Diagnosis not present

## 2019-07-20 DIAGNOSIS — R29898 Other symptoms and signs involving the musculoskeletal system: Secondary | ICD-10-CM | POA: Diagnosis not present

## 2019-07-20 DIAGNOSIS — J309 Allergic rhinitis, unspecified: Secondary | ICD-10-CM | POA: Diagnosis not present

## 2019-07-20 DIAGNOSIS — D500 Iron deficiency anemia secondary to blood loss (chronic): Secondary | ICD-10-CM

## 2019-07-20 DIAGNOSIS — I4891 Unspecified atrial fibrillation: Secondary | ICD-10-CM | POA: Diagnosis not present

## 2019-07-20 DIAGNOSIS — R2681 Unsteadiness on feet: Secondary | ICD-10-CM | POA: Diagnosis not present

## 2019-07-20 DIAGNOSIS — M6281 Muscle weakness (generalized): Secondary | ICD-10-CM | POA: Diagnosis not present

## 2019-07-20 DIAGNOSIS — S72042D Displaced fracture of base of neck of left femur, subsequent encounter for closed fracture with routine healing: Secondary | ICD-10-CM | POA: Diagnosis not present
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
## 2019-07-21 DIAGNOSIS — S72042D Displaced fracture of base of neck of left femur, subsequent encounter for closed fracture with routine healing: Secondary | ICD-10-CM | POA: Diagnosis not present

## 2019-07-21 DIAGNOSIS — R29898 Other symptoms and signs involving the musculoskeletal system: Secondary | ICD-10-CM | POA: Diagnosis not present

## 2019-07-21 DIAGNOSIS — R2681 Unsteadiness on feet: Secondary | ICD-10-CM | POA: Diagnosis not present

## 2019-07-21 DIAGNOSIS — M6281 Muscle weakness (generalized): Secondary | ICD-10-CM | POA: Diagnosis not present

## 2019-07-22 DIAGNOSIS — S72042D Displaced fracture of base of neck of left femur, subsequent encounter for closed fracture with routine healing: Secondary | ICD-10-CM | POA: Diagnosis not present

## 2019-07-22 DIAGNOSIS — R2681 Unsteadiness on feet: Secondary | ICD-10-CM | POA: Diagnosis not present

## 2019-07-22 DIAGNOSIS — M6281 Muscle weakness (generalized): Secondary | ICD-10-CM | POA: Diagnosis not present

## 2019-07-22 DIAGNOSIS — R29898 Other symptoms and signs involving the musculoskeletal system: Secondary | ICD-10-CM | POA: Diagnosis not present

## 2019-07-23 DIAGNOSIS — R29898 Other symptoms and signs involving the musculoskeletal system: Secondary | ICD-10-CM | POA: Diagnosis not present

## 2019-07-23 DIAGNOSIS — S72042D Displaced fracture of base of neck of left femur, subsequent encounter for closed fracture with routine healing: Secondary | ICD-10-CM | POA: Diagnosis not present

## 2019-07-23 DIAGNOSIS — M6281 Muscle weakness (generalized): Secondary | ICD-10-CM | POA: Diagnosis not present

## 2019-07-23 DIAGNOSIS — R2681 Unsteadiness on feet: Secondary | ICD-10-CM | POA: Diagnosis not present

## 2019-07-24 DIAGNOSIS — M6281 Muscle weakness (generalized): Secondary | ICD-10-CM | POA: Diagnosis not present

## 2019-07-24 DIAGNOSIS — R2681 Unsteadiness on feet: Secondary | ICD-10-CM | POA: Diagnosis not present

## 2019-07-24 DIAGNOSIS — R29898 Other symptoms and signs involving the musculoskeletal system: Secondary | ICD-10-CM | POA: Diagnosis not present

## 2019-07-24 DIAGNOSIS — S72042D Displaced fracture of base of neck of left femur, subsequent encounter for closed fracture with routine healing: Secondary | ICD-10-CM | POA: Diagnosis not present

## 2019-07-27 DIAGNOSIS — R2681 Unsteadiness on feet: Secondary | ICD-10-CM | POA: Diagnosis not present

## 2019-07-27 DIAGNOSIS — S72042D Displaced fracture of base of neck of left femur, subsequent encounter for closed fracture with routine healing: Secondary | ICD-10-CM | POA: Diagnosis not present

## 2019-07-27 DIAGNOSIS — R29898 Other symptoms and signs involving the musculoskeletal system: Secondary | ICD-10-CM | POA: Diagnosis not present

## 2019-07-27 DIAGNOSIS — M6281 Muscle weakness (generalized): Secondary | ICD-10-CM | POA: Diagnosis not present

## 2019-07-29 DIAGNOSIS — S72042D Displaced fracture of base of neck of left femur, subsequent encounter for closed fracture with routine healing: Secondary | ICD-10-CM | POA: Diagnosis not present

## 2019-07-29 DIAGNOSIS — R2681 Unsteadiness on feet: Secondary | ICD-10-CM | POA: Diagnosis not present

## 2019-07-29 DIAGNOSIS — R29898 Other symptoms and signs involving the musculoskeletal system: Secondary | ICD-10-CM | POA: Diagnosis not present

## 2019-07-29 DIAGNOSIS — M6281 Muscle weakness (generalized): Secondary | ICD-10-CM | POA: Diagnosis not present

## 2019-07-30 DIAGNOSIS — S72042D Displaced fracture of base of neck of left femur, subsequent encounter for closed fracture with routine healing: Secondary | ICD-10-CM | POA: Diagnosis not present

## 2019-07-30 DIAGNOSIS — M6281 Muscle weakness (generalized): Secondary | ICD-10-CM | POA: Diagnosis not present

## 2019-07-30 DIAGNOSIS — R2681 Unsteadiness on feet: Secondary | ICD-10-CM | POA: Diagnosis not present

## 2019-07-30 DIAGNOSIS — R29898 Other symptoms and signs involving the musculoskeletal system: Secondary | ICD-10-CM | POA: Diagnosis not present

## 2019-08-03 DIAGNOSIS — Z20828 Contact with and (suspected) exposure to other viral communicable diseases: Secondary | ICD-10-CM | POA: Diagnosis not present

## 2019-08-03 DIAGNOSIS — R29898 Other symptoms and signs involving the musculoskeletal system: Secondary | ICD-10-CM | POA: Diagnosis not present

## 2019-08-03 DIAGNOSIS — M6281 Muscle weakness (generalized): Secondary | ICD-10-CM | POA: Diagnosis not present

## 2019-08-03 DIAGNOSIS — S72042D Displaced fracture of base of neck of left femur, subsequent encounter for closed fracture with routine healing: Secondary | ICD-10-CM | POA: Diagnosis not present

## 2019-08-03 DIAGNOSIS — R2681 Unsteadiness on feet: Secondary | ICD-10-CM | POA: Diagnosis not present

## 2019-08-05 DIAGNOSIS — M6281 Muscle weakness (generalized): Secondary | ICD-10-CM | POA: Diagnosis not present

## 2019-08-05 DIAGNOSIS — R2681 Unsteadiness on feet: Secondary | ICD-10-CM | POA: Diagnosis not present

## 2019-08-05 DIAGNOSIS — S72042D Displaced fracture of base of neck of left femur, subsequent encounter for closed fracture with routine healing: Secondary | ICD-10-CM | POA: Diagnosis not present

## 2019-08-05 DIAGNOSIS — R29898 Other symptoms and signs involving the musculoskeletal system: Secondary | ICD-10-CM | POA: Diagnosis not present

## 2019-08-07 DIAGNOSIS — R2681 Unsteadiness on feet: Secondary | ICD-10-CM | POA: Diagnosis not present

## 2019-08-07 DIAGNOSIS — R29898 Other symptoms and signs involving the musculoskeletal system: Secondary | ICD-10-CM | POA: Diagnosis not present

## 2019-08-07 DIAGNOSIS — M6281 Muscle weakness (generalized): Secondary | ICD-10-CM | POA: Diagnosis not present

## 2019-08-07 DIAGNOSIS — S72042D Displaced fracture of base of neck of left femur, subsequent encounter for closed fracture with routine healing: Secondary | ICD-10-CM | POA: Diagnosis not present

## 2019-08-10 DIAGNOSIS — R2681 Unsteadiness on feet: Secondary | ICD-10-CM | POA: Diagnosis not present

## 2019-08-10 DIAGNOSIS — R29898 Other symptoms and signs involving the musculoskeletal system: Secondary | ICD-10-CM | POA: Diagnosis not present

## 2019-08-10 DIAGNOSIS — Z20828 Contact with and (suspected) exposure to other viral communicable diseases: Secondary | ICD-10-CM | POA: Diagnosis not present

## 2019-08-10 DIAGNOSIS — M6281 Muscle weakness (generalized): Secondary | ICD-10-CM | POA: Diagnosis not present

## 2019-08-10 DIAGNOSIS — S72042D Displaced fracture of base of neck of left femur, subsequent encounter for closed fracture with routine healing: Secondary | ICD-10-CM | POA: Diagnosis not present

## 2019-08-11 DIAGNOSIS — M6281 Muscle weakness (generalized): Secondary | ICD-10-CM | POA: Diagnosis not present

## 2019-08-11 DIAGNOSIS — R29898 Other symptoms and signs involving the musculoskeletal system: Secondary | ICD-10-CM | POA: Diagnosis not present

## 2019-08-11 DIAGNOSIS — S72042D Displaced fracture of base of neck of left femur, subsequent encounter for closed fracture with routine healing: Secondary | ICD-10-CM | POA: Diagnosis not present

## 2019-08-11 DIAGNOSIS — R2681 Unsteadiness on feet: Secondary | ICD-10-CM | POA: Diagnosis not present

## 2019-08-12 DIAGNOSIS — R2681 Unsteadiness on feet: Secondary | ICD-10-CM | POA: Diagnosis not present

## 2019-08-12 DIAGNOSIS — R29898 Other symptoms and signs involving the musculoskeletal system: Secondary | ICD-10-CM | POA: Diagnosis not present

## 2019-08-12 DIAGNOSIS — S72042D Displaced fracture of base of neck of left femur, subsequent encounter for closed fracture with routine healing: Secondary | ICD-10-CM | POA: Diagnosis not present

## 2019-08-12 DIAGNOSIS — M6281 Muscle weakness (generalized): Secondary | ICD-10-CM | POA: Diagnosis not present

## 2019-08-13 DIAGNOSIS — M6281 Muscle weakness (generalized): Secondary | ICD-10-CM | POA: Diagnosis not present

## 2019-08-13 DIAGNOSIS — R2681 Unsteadiness on feet: Secondary | ICD-10-CM | POA: Diagnosis not present

## 2019-08-13 DIAGNOSIS — R29898 Other symptoms and signs involving the musculoskeletal system: Secondary | ICD-10-CM | POA: Diagnosis not present

## 2019-08-13 DIAGNOSIS — S72042D Displaced fracture of base of neck of left femur, subsequent encounter for closed fracture with routine healing: Secondary | ICD-10-CM | POA: Diagnosis not present

## 2019-08-17 DIAGNOSIS — M6281 Muscle weakness (generalized): Secondary | ICD-10-CM | POA: Diagnosis not present

## 2019-08-17 DIAGNOSIS — E2740 Unspecified adrenocortical insufficiency: Secondary | ICD-10-CM | POA: Diagnosis not present

## 2019-08-17 DIAGNOSIS — R2681 Unsteadiness on feet: Secondary | ICD-10-CM | POA: Diagnosis not present

## 2019-08-17 DIAGNOSIS — S72042D Displaced fracture of base of neck of left femur, subsequent encounter for closed fracture with routine healing: Secondary | ICD-10-CM | POA: Diagnosis not present

## 2019-08-17 DIAGNOSIS — I341 Nonrheumatic mitral (valve) prolapse: Secondary | ICD-10-CM | POA: Diagnosis not present

## 2019-08-17 DIAGNOSIS — E039 Hypothyroidism, unspecified: Secondary | ICD-10-CM | POA: Diagnosis not present

## 2019-08-17 DIAGNOSIS — I10 Essential (primary) hypertension: Secondary | ICD-10-CM | POA: Diagnosis not present

## 2019-08-17 DIAGNOSIS — Z79899 Other long term (current) drug therapy: Secondary | ICD-10-CM | POA: Diagnosis not present

## 2019-08-17 DIAGNOSIS — E785 Hyperlipidemia, unspecified: Secondary | ICD-10-CM | POA: Diagnosis not present

## 2019-08-17 DIAGNOSIS — R29898 Other symptoms and signs involving the musculoskeletal system: Secondary | ICD-10-CM | POA: Diagnosis not present

## 2019-08-18 DIAGNOSIS — M6281 Muscle weakness (generalized): Secondary | ICD-10-CM | POA: Diagnosis not present

## 2019-08-18 DIAGNOSIS — R2681 Unsteadiness on feet: Secondary | ICD-10-CM | POA: Diagnosis not present

## 2019-08-18 DIAGNOSIS — Z20828 Contact with and (suspected) exposure to other viral communicable diseases: Secondary | ICD-10-CM | POA: Diagnosis not present

## 2019-08-18 DIAGNOSIS — R29898 Other symptoms and signs involving the musculoskeletal system: Secondary | ICD-10-CM | POA: Diagnosis not present

## 2019-08-18 DIAGNOSIS — S72042D Displaced fracture of base of neck of left femur, subsequent encounter for closed fracture with routine healing: Secondary | ICD-10-CM | POA: Diagnosis not present

## 2019-08-19 ENCOUNTER — Ambulatory Visit: Payer: Medicare Other | Admitting: Orthopaedic Surgery

## 2019-08-19 DIAGNOSIS — M6281 Muscle weakness (generalized): Secondary | ICD-10-CM | POA: Diagnosis not present

## 2019-08-19 DIAGNOSIS — R29898 Other symptoms and signs involving the musculoskeletal system: Secondary | ICD-10-CM | POA: Diagnosis not present

## 2019-08-19 DIAGNOSIS — S72042D Displaced fracture of base of neck of left femur, subsequent encounter for closed fracture with routine healing: Secondary | ICD-10-CM | POA: Diagnosis not present

## 2019-08-19 DIAGNOSIS — R2681 Unsteadiness on feet: Secondary | ICD-10-CM | POA: Diagnosis not present

## 2019-08-24 DIAGNOSIS — R2681 Unsteadiness on feet: Secondary | ICD-10-CM | POA: Diagnosis not present

## 2019-08-24 DIAGNOSIS — S72042D Displaced fracture of base of neck of left femur, subsequent encounter for closed fracture with routine healing: Secondary | ICD-10-CM | POA: Diagnosis not present

## 2019-08-24 DIAGNOSIS — Z23 Encounter for immunization: Secondary | ICD-10-CM | POA: Diagnosis not present

## 2019-08-24 DIAGNOSIS — R29898 Other symptoms and signs involving the musculoskeletal system: Secondary | ICD-10-CM | POA: Diagnosis not present

## 2019-08-24 DIAGNOSIS — M6281 Muscle weakness (generalized): Secondary | ICD-10-CM | POA: Diagnosis not present

## 2019-08-25 ENCOUNTER — Ambulatory Visit: Payer: Medicare Other | Admitting: Orthopaedic Surgery

## 2019-08-25 DIAGNOSIS — R2681 Unsteadiness on feet: Secondary | ICD-10-CM | POA: Diagnosis not present

## 2019-08-25 DIAGNOSIS — S72042D Displaced fracture of base of neck of left femur, subsequent encounter for closed fracture with routine healing: Secondary | ICD-10-CM | POA: Diagnosis not present

## 2019-08-25 DIAGNOSIS — R29898 Other symptoms and signs involving the musculoskeletal system: Secondary | ICD-10-CM | POA: Diagnosis not present

## 2019-08-25 DIAGNOSIS — M6281 Muscle weakness (generalized): Secondary | ICD-10-CM | POA: Diagnosis not present

## 2019-08-26 DIAGNOSIS — S72042D Displaced fracture of base of neck of left femur, subsequent encounter for closed fracture with routine healing: Secondary | ICD-10-CM | POA: Diagnosis not present

## 2019-08-26 DIAGNOSIS — M6281 Muscle weakness (generalized): Secondary | ICD-10-CM | POA: Diagnosis not present

## 2019-08-26 DIAGNOSIS — R2681 Unsteadiness on feet: Secondary | ICD-10-CM | POA: Diagnosis not present

## 2019-08-26 DIAGNOSIS — R29898 Other symptoms and signs involving the musculoskeletal system: Secondary | ICD-10-CM | POA: Diagnosis not present

## 2019-08-27 DIAGNOSIS — S72042D Displaced fracture of base of neck of left femur, subsequent encounter for closed fracture with routine healing: Secondary | ICD-10-CM | POA: Diagnosis not present

## 2019-08-27 DIAGNOSIS — R2681 Unsteadiness on feet: Secondary | ICD-10-CM | POA: Diagnosis not present

## 2019-08-27 DIAGNOSIS — M6281 Muscle weakness (generalized): Secondary | ICD-10-CM | POA: Diagnosis not present

## 2019-08-27 DIAGNOSIS — R29898 Other symptoms and signs involving the musculoskeletal system: Secondary | ICD-10-CM | POA: Diagnosis not present

## 2019-08-28 ENCOUNTER — Encounter: Payer: Self-pay | Admitting: Nurse Practitioner

## 2019-08-28 ENCOUNTER — Ambulatory Visit: Payer: Medicare Other | Admitting: Orthopaedic Surgery

## 2019-08-28 ENCOUNTER — Non-Acute Institutional Stay: Payer: Medicare Other | Admitting: Nurse Practitioner

## 2019-08-28 DIAGNOSIS — L89620 Pressure ulcer of left heel, unstageable: Secondary | ICD-10-CM

## 2019-08-28 DIAGNOSIS — K922 Gastrointestinal hemorrhage, unspecified: Secondary | ICD-10-CM

## 2019-08-28 DIAGNOSIS — F339 Major depressive disorder, recurrent, unspecified: Secondary | ICD-10-CM | POA: Diagnosis not present

## 2019-08-28 DIAGNOSIS — D500 Iron deficiency anemia secondary to blood loss (chronic): Secondary | ICD-10-CM

## 2019-08-28 DIAGNOSIS — R413 Other amnesia: Secondary | ICD-10-CM | POA: Diagnosis not present

## 2019-08-28 DIAGNOSIS — K529 Noninfective gastroenteritis and colitis, unspecified: Secondary | ICD-10-CM | POA: Diagnosis not present

## 2019-08-28 DIAGNOSIS — G63 Polyneuropathy in diseases classified elsewhere: Secondary | ICD-10-CM

## 2019-08-28 DIAGNOSIS — S72002A Fracture of unspecified part of neck of left femur, initial encounter for closed fracture: Secondary | ICD-10-CM | POA: Diagnosis not present

## 2019-08-28 DIAGNOSIS — J309 Allergic rhinitis, unspecified: Secondary | ICD-10-CM | POA: Diagnosis not present

## 2019-08-28 DIAGNOSIS — K219 Gastro-esophageal reflux disease without esophagitis: Secondary | ICD-10-CM

## 2019-08-28 DIAGNOSIS — I4891 Unspecified atrial fibrillation: Secondary | ICD-10-CM | POA: Diagnosis not present

## 2019-08-28 DIAGNOSIS — E782 Mixed hyperlipidemia: Secondary | ICD-10-CM | POA: Diagnosis not present
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
## 2019-08-30 ENCOUNTER — Encounter: Payer: Self-pay | Admitting: Nurse Practitioner

## 2019-08-30 DIAGNOSIS — L89620 Pressure ulcer of left heel, unstageable: Secondary | ICD-10-CM | POA: Insufficient documentation

## 2019-08-30 DIAGNOSIS — J309 Allergic rhinitis, unspecified: Secondary | ICD-10-CM | POA: Insufficient documentation

## 2019-08-30 DIAGNOSIS — F339 Major depressive disorder, recurrent, unspecified: Secondary | ICD-10-CM | POA: Insufficient documentation

## 2019-08-30 NOTE — Assessment & Plan Note (Signed)
A dime sized eschar left heel, no s/s of infection, it should heal.

## 2019-08-30 NOTE — Assessment & Plan Note (Addendum)
Stabilizing, Hgb 10.9 06/23/19, improved from 8.9 05/21/19, continue Fe, Vit B12 supplement. Had transfusion in the past.

## 2019-08-30 NOTE — Assessment & Plan Note (Addendum)
08/28/19 Pharm: CrCl 30-59 400-1400mg  given in 2 evenly divided doses. Will change Gabapentin 600mg  bid. 07/2019 CrCl 25ml/min. Continue Keppra.

## 2019-08-30 NOTE — Assessment & Plan Note (Signed)
No active GI bleed, f/u GI as needed.

## 2019-08-30 NOTE — Assessment & Plan Note (Signed)
Stable, continue Pantoprazole.  

## 2019-08-30 NOTE — Assessment & Plan Note (Signed)
continue Atorvastatin, ASA

## 2019-08-30 NOTE — Assessment & Plan Note (Signed)
Stable, continue Colestid 1gm tid.

## 2019-08-30 NOTE — Assessment & Plan Note (Signed)
Heart rate is controlled, continue Amiodarone.

## 2019-08-30 NOTE — Assessment & Plan Note (Signed)
Stable, continue Mirtazapine.  

## 2019-08-30 NOTE — Assessment & Plan Note (Signed)
S/p left hip replacement, continue Tylenol for pain.

## 2019-08-30 NOTE — Assessment & Plan Note (Signed)
Continue Mementine for memory.

## 2019-08-30 NOTE — Assessment & Plan Note (Signed)
Stable, continue Zyrtec, Flonase.

## 2019-08-31 DIAGNOSIS — S72042D Displaced fracture of base of neck of left femur, subsequent encounter for closed fracture with routine healing: Secondary | ICD-10-CM | POA: Diagnosis not present

## 2019-08-31 DIAGNOSIS — R29898 Other symptoms and signs involving the musculoskeletal system: Secondary | ICD-10-CM | POA: Diagnosis not present

## 2019-08-31 DIAGNOSIS — M6281 Muscle weakness (generalized): Secondary | ICD-10-CM | POA: Diagnosis not present

## 2019-08-31 DIAGNOSIS — R2681 Unsteadiness on feet: Secondary | ICD-10-CM | POA: Diagnosis not present

## 2019-09-04 ENCOUNTER — Ambulatory Visit: Payer: Medicare Other | Admitting: Orthopaedic Surgery

## 2019-09-08 ENCOUNTER — Encounter: Payer: Self-pay | Admitting: Orthopaedic Surgery

## 2019-09-08 ENCOUNTER — Ambulatory Visit (INDEPENDENT_AMBULATORY_CARE_PROVIDER_SITE_OTHER): Payer: Medicare Other | Admitting: Orthopaedic Surgery

## 2019-09-08 ENCOUNTER — Other Ambulatory Visit: Payer: Self-pay

## 2019-09-08 DIAGNOSIS — Z96642 Presence of left artificial hip joint: Secondary | ICD-10-CM
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
## 2019-09-14 DIAGNOSIS — L97421 Non-pressure chronic ulcer of left heel and midfoot limited to breakdown of skin: Secondary | ICD-10-CM | POA: Diagnosis not present

## 2019-09-17 ENCOUNTER — Telehealth (INDEPENDENT_AMBULATORY_CARE_PROVIDER_SITE_OTHER): Payer: Medicare Other | Admitting: Cardiovascular Disease

## 2019-09-17 ENCOUNTER — Encounter: Payer: Self-pay | Admitting: Cardiovascular Disease

## 2019-09-17 VITALS — BP 102/56 | HR 67 | Ht 62.0 in | Wt 110.0 lb

## 2019-09-17 DIAGNOSIS — I10 Essential (primary) hypertension: Secondary | ICD-10-CM | POA: Diagnosis not present

## 2019-09-17 DIAGNOSIS — I480 Paroxysmal atrial fibrillation: Secondary | ICD-10-CM | POA: Diagnosis not present

## 2019-09-17 DIAGNOSIS — I350 Nonrheumatic aortic (valve) stenosis: Secondary | ICD-10-CM

## 2019-09-17 DIAGNOSIS — I272 Pulmonary hypertension, unspecified: Secondary | ICD-10-CM

## 2019-09-17 DIAGNOSIS — I340 Nonrheumatic mitral (valve) insufficiency: Secondary | ICD-10-CM | POA: Diagnosis not present

## 2019-09-17 DIAGNOSIS — I771 Stricture of artery: Secondary | ICD-10-CM | POA: Diagnosis not present

## 2019-09-17 NOTE — Patient Instructions (Signed)
Medication Instructions:  CONTINUE WITH CURRENT MEDICATIONS *If you need a refill on your cardiac medications before your next appointment, please call your pharmacy*  Follow-Up: At [facility], you and your health needs are our priority.  As part of our continuing mission to provide you with exceptional heart care, we have created designated Provider Care Teams.  These Care Teams include your primary Cardiologist (physician) and Advanced Practice Providers (APPs -  Physician Assistants and Nurse Practitioners) who all work together to provide you with the care you need, when you need it.  Your next appointment:   6 month(s)  The format for your next appointment:   In Person  Provider:   Thomas Kelly, MD  

## 2019-09-17 NOTE — Progress Notes (Signed)
Virtual Visit via Telephone Note   This visit type was conducted due to national recommendations for restrictions regarding the COVID-19 Pandemic (e.g. social distancing) in an effort to limit this patient's exposure and mitigate transmission in our community.  Due to her co-morbid illnesses, this patient is at least at moderate risk for complications without adequate follow up.  This format is felt to be most appropriate for this patient at this time.  The patient did not have access to video technology/had technical difficulties with video requiring transitioning to audio format only (telephone).  All issues noted in this document were discussed and addressed.  No physical exam could be performed with this format.  Please refer to the patient's chart for her  consent to telehealth for [facility].   Date:  09/17/2019   ID:  [name], DOB 25-Jul-1922, MRN [id number]  Patient Location: Other:  [place] [facility] Provider Location: Home  PCP:  Deland Pretty, MD  Cardiologist:  Shelva Majestic, MD  Electrophysiologist:  None   Evaluation Performed:  Follow-Up Visit  Chief Complaint:  7 month F/U  History of Present Illness:    [name] is a 86 y.o. female who has a history of hypertension, hyperlipidemia, as well as valvular heart disease. An echo Doppler study in December 2013 showed normal systolic function with an ejection fraction of 55-60%. She had mild stenosis of her aortic valve, moderately severe calcified anulus of the mitral valve with moderate MR, moderately severe LA dilatation, mild-to-moderate RA dilatation, moderate tricuspid regurgitation and moderately severe pulmonary hypertension with PA pressure estimated at 66 mm. She was started on amlodipine at 2.5 mg. She felt that she was breathing better since initiating this therapy and her blood pressure had markedly improved. A followup echo Doppler study on 11/16/2013 showed normal systolic function with an  ejection fraction at 60-65%. Diastolic parameters were normal. She had mitral annular calcification with moderate mitral regurgitation. The left atrium was moderately dilated, the right atrium was moderately dilated, and her tricuspid stenosis was now considered severe. Pulmonary pressures were slightly reduced from previously, but were still elevated at 61 mm.   She was admitted to the hospital on 09/13/2014 and discharged 3 days later. On the day of admission she awakened from sleep with midsternal all chest discomfort which he initially felt was indigestion. Her troponin was mildly elevated at 0.23, which increased to 1.15 and there were new inferior T-wave abnormalities. She was felt to have suffered a non-ST segment elevation MI. She also developed paroxysmal atrial fibrillation with RVR, which resulted in similar symptomatology as her admission and was felt most likely that this may have been the cause for her admission. I performed cardiac catheterization on 09/14/14 Fluoroscopy revealed severe mitral annular calcification and very mild calcification in the region of the aortic valve. She had hyperdynamic LV function with an ejection fraction of a proximally 70%. There was severe mitral annular calcification with 3+ MR. There was mild calcification in the region of the RCA ostium, but otherwise normal-appearing coronary arteries. Right heart catheterization was performed and her wedge pressure mean was 10. LV pressure was 152/11. Pulmonary artery pressure was 33/12. Cardiac output was 3.3 L/m by the thermodilution method and 3.7 L/m by the Fick method.  When I saw her in April 2016 she was doing well and maintaining sinus rhythm with ventricular rate at 58 bpm. She was recently hospitalized on 05/08/2015 through 05/12/2015 with this and presyncope. She was found to be  bradycardic and had a junctional rhythm. Her Cardizem, beta blocker therapy was discontinued. She Was dehydrated  andalso had mild elevation of potassium with renal insufficiency and ACE inhibition was discontinued. She also had a urinary tract infection for which she was treated with Keflex. She was started back on iron therapy for iron deficiency anemia. At discharge, Cardizem low dose was restarted.  After going home from the hospital, on 05/17/2015 her heart rate increased to greater than 100 bpm. Her blood pressure now has been increased on her, reduced medications. She is on Coumadin therapy With a chads2vasc score of 4. When I saw her, her blood pressure was elevated and with her episodes of increased heart rate. I started her on carvedilol one half of a 3.125 mg twice a day. She has tolerated this well and denies any recurrent episodes of fast heartbeat or any episodes of dizziness. She admits to trace edema above her pressure socks. An echo Doppler study on 05/09/2015 showed an ejection fraction of 65-70%. There was grade 1 diastolic dysfunction. There was mild aortic stenosis with trivial AR, severe mitral annular calcification with moderate MR, moderate left atrial dilatation, moderate TR and increased pulmonary pressures at 74 mm Hg. in December 2016, a carotid duplex study demonstrated heterogeneous plaque bilaterally. There was 1-39% bilateral ICA stenoses. She had normal subclavian arteries bilaterally. She had patent vertebral arteries with antegrade flow  In October 2017 she had a fall and tripped over a step. She did not go to the emergency room, but saw her primary physician and a CT scan of her head revealed mild injury to her right occipital scalp but no underlying skull fracture or intracranial hemorrhage. There was an air-fluid level of the maxillary sinus.   When I saw her in April 2018. She was not having any chest pain, although admitted to some mild shortness of breath. She was walking with a cane. specifically denies any chest pain. She goes to a salt water pool twice  per week. She denies any episodes of dizziness. She denies any further falls. She is no longer driving.   I last saw her in October 2018 at which time she was getting more visibly weak.She denied any recurrent chest pain. She really was experiencing some shortness of breath with walking. She was unaware of recurrent arrhythmia. She denies syncope. A follow-up echo Doppler study in May 2018 showed an EF of 60-65% with grade 2 diastolic dysfunction. There was mild aortic stenosis with trivial AR, severe left atrial dilation with severe mitral regurgitation and she had significant pulmonary hypertension with a PA peak pressure estimate at 64 mm. her last office visit, I was concerned with potential fall risk. Now that she has become more frail and ordered an event monitor to make certain she was not having recurrent PAF. The monitor was done from 06/26/2017 through 07/25/2017. She was maintaining sinus rhythm and had some mild bradycardia, sinus arrhythmia with PACs, and some episodes of sinus tachycardia. There were no episodes of recurrent AF.   She was evaluated by Loma Sousa, PAC on 08/10/2017 and later by Jory Sims, NP on 10/21/2017. Given her advanced age and severe MR medical therapy has been continued without plans for surgical intervention. She has since purchased a scooter to allow for improved mobility. She still living in independent living at [facility] and has been avoiding switching to assisted living.   When I saw her in f/u I had a long discussion with both she and her son  concerning potential fall risk and consideration for possible discontinuance of anticoagulation. At her much discussion, they preferred to stay on anticoagulation therapy.  She was admitted to [facility] on December 06, 2017 and was hospitalized until December 17, 2017. She presented with GI bleed with a hemoglobin of 4 and ultimately received 3 units of packed red blood cell transfusion. Her  hemoglobin improved. Warfarin was placed on hold. She was not found to have significant abnormality on endoscopy although she had a hiatal hernia without acute source of bleeding. At the time colonoscopy was felt to be too high risk but if bleeding occurs this would be undertaken. Subsequently, she developed atrial fibrillation. I had seen her during her hospitalization and recommended discontinuance of warfarin therapy and that it not be reinstituted. In the hospital, atrial fibrillation rate was initially treated with IV Cardizem as well as initiation of digoxin. She later was started on amiodarone for rate control which apparently converted her back to sinus rhythm. She was discharged on December 17, 2017 and initially went to assisted living. She has not had any further blood loss or drop in hemoglobin since her warfarin has been discontinued. She denies any anginal type symptoms. She has previously documentation of mitral regurgitation which may provide some reduction in potential left atrial stasis if she were to return into A. fib.   I saw her for her initial post hospital follow-up evaluation on Dec 23, 2017. At that time, she was maintaining sinus rhythm. She was on amiodarone 200 mg twice a day I recommended she stay on that dose for a total of 4 weeks with plans to reduce to 300 mg at the beginning of June. She subsequently called the office and stated that she felt that the amiodarone may be causing her eyesight to working and she felt a little "shaky. " As result her dose was reduced to 200 mg. She is unaware of any recurrent atrial fibrillation. She denies any chest pressure. Her eyesight is improved on her reduced dose. She has noticed a mild hand tremor. She is no longer in assisted living and is now back to independent living in her retirement community.   Whenseen in June 2019 she was maintaining sinus rhythm and I reduced her amiodarone down to 200 mg alternating with 100  mg every other day. She has continued to feel well. She is in independent living. At time she notices the room spinning. She is unaware of postural symptoms. He is unaware of any recurrent atrial fibrillation. She denies chest pressure.   When I last saw her in October 2019 she had unequal blood pressures in her right arm and left arm at 184/78 on the right and 160/80 on the left.  As result she underwent carotid and upper extremity Doppler studies which suggested mild left subclavian stenosis.   She has been residing at [facility] in independent living.  Her son is there with her today for this telemedicine visit.  Presently, she denies chest pain.  She denies any discomfort down her left arm.  She admits that she has episodes where shakes more particularly with stressful situations.  She denies significant swelling.  She does experience some mild shortness of breath with activity.  Since her last telemedicine visit evaluation in June 2020, on May 01, 2019 she had fallen and unfortunately sustained a left displaced femoral neck fracture.  She was hospitalized and underwent left hip monopolar hemi arthroplasty on May 02, 2019.  While hospitalized, she underwent  a follow-up echo Doppler study on May 02, 2019 which showed hyperdynamic LV function with EF greater than 65%.  There was grade 2 diastolic dysfunction.  There was moderate thickening of her aortic valve with moderate calcification and moderate stenosis.  There was moderate mitral regurgitation.  Carotid studies did not reveal any high-grade stenoses but bilateral 1 to 39% stenoses were present.  She had normal right vertebral flow with antegrade apparent flow on the left as well as disturbed left subclavian artery flow with left subclavian stenosis.  She subsequently did well but had developed post hospital anemia leading to reevaluation.  She was transfused with 1 pack unit of red blood cells in the emergency room on  May 12, 2019.  For several months, she was at an assisted living and underwent physical therapy and Occupational Therapy.  Over the past several weeks she is now returned back to her independent living apartment.  She is seen in a telemedicine visit today.  She feels well.  She feels she is back to baseline.  She denies any lightheadedness or chest pain.  She stands up slowly and walks with a walker.  She is unaware of palpitations.  At times if she overdoes it she may note some mild shortness of breath but this resolves with rest.  The patient does not have symptoms concerning for COVID-19 infection (fever, chills, cough, or new shortness of breath).    Past Medical History:  Diagnosis Date   Abnormality of gait 09/07/2015   Anemia, iron deficiency 09/16/2014   Anxiety    Arthritis    "hands and feet" (12/06/2017)   CAD (coronary artery disease)    a. 08/2014 NSTEMI/Cath: mild Ca2+ or RCA ostium, otw nl cors. CO 3.3 L/min (thermo), 3.7 L/min (Fick).   Carotid arterial disease ([place]) 07/30/2012   carotid doppler; normal study   CHF (congestive heart failure) (HCC)    Chronic anticoagulation, with coumadin, with PAF and CHADS2Vasc2 score of 4 09/16/2014   Chronic back pain    "all over" (0000000)   Complication of anesthesia    "they had hard time waking me up" (12/06/2017)   Degenerative arthritis    Diverticulosis    Essential hypertension    GERD (gastroesophageal reflux disease)    History of blood transfusion 12/06/2017   History of hiatal hernia    History of nuclear stress test 09/19/2007   normal pattern of perfusion; post-stress EF 86%; EKG negative for ischemia; low risk scan   Hyperlipidemia    Left carotid bruit    a. 07/2015 Carotid U/S: 1-39% bilat ICA stenosis.   Memory disorder 03/05/2014   Mild renal insufficiency    Mitral prolapse    Neuropathy    "hands and feet" (12/06/2017)   PAF (paroxysmal atrial fibrillation) ([place])    a.  08/2014-->Coumadin (CHA2DS2VASc = 4).   Peripheral neuropathy    Small fiber    Pre-syncope    a. 04/2015 in setting of bradycardia-->CCB/BB doses adjusted.   Pulmonary hypertension (HCC)    Seasonal allergies    Skin cancer    "burned off my face" (12/06/2017)   Valvular heart disease    a. 04/2015 Echo: EF 65-70%, no rwma, Gr1 DD, mild AS, triv AI, mild MS, mod MR, mod dil LA, mod TR, sev increased PASP.   Past Surgical History:  Procedure Laterality Date   ABDOMINAL HYSTERECTOMY     APPENDECTOMY     BUNIONECTOMY Bilateral    CARPAL TUNNEL RELEASE Left  CHOLECYSTECTOMY OPEN     DILATION AND CURETTAGE OF UTERUS     ESOPHAGOGASTRODUODENOSCOPY N/A 12/10/2017   Procedure: ESOPHAGOGASTRODUODENOSCOPY (EGD);  Surgeon: Clarene Essex, MD;  Location: [facility];  Service: Endoscopy;  Laterality: N/A;   FERTILITY SURGERY     resuspension procedure    FOOT FRACTURE SURGERY Left    FRACTURE SURGERY     HIP ARTHROPLASTY Left 05/02/2019   Procedure: ARTHROPLASTY BIPOLAR HIP (HEMIARTHROPLASTY);  Surgeon: Marybelle Killings, MD;  Location: [facility];  Service: Orthopedics;  Laterality: Left;   JOINT REPLACEMENT     LEFT HEART CATHETERIZATION WITH CORONARY ANGIOGRAM N/A 09/14/2014   Procedure: LEFT HEART CATHETERIZATION WITH CORONARY ANGIOGRAM;  Surgeon: Troy Sine, MD;  Location: [facility] C[facility] LAB;  Service: Cardiovascular;  Laterality: N/A;   REPLACEMENT TOTAL KNEE Right 03/2008   Archie Endo 12/19/2010   ROBOTIC ASSISTED BILATERAL SALPINGO OOPHERECTOMY Bilateral 02/19/2017   Procedure: XI ROBOTIC ASSISTED BILATERAL SALPINGO OOPHORECTOMY AND LYSIS OF ADHESION;  Surgeon: Everitt Amber, MD;  Location: [facility];  Service: Gynecology;  Laterality: Bilateral;   TEAR DUCT PROBING Left    TONSILLECTOMY     UMBILICAL HERNIA REPAIR       Current Meds  Medication Sig   acetaminophen (TYLENOL) 500 MG tablet Take 500 mg by mouth 4 (four) times daily.    acetaminophen (TYLENOL) 500 MG tablet Take 500  mg by mouth daily.   amiodarone (PACERONE) 100 MG tablet Take 1 tablet (100 mg total) by mouth daily.   aspirin 81 MG chewable tablet Chew 81 mg by mouth daily.   atorvastatin (LIPITOR) 10 MG tablet Take 5 mg by mouth daily.    calcium carbonate (OS-CAL) 1250 (500 Ca) MG chewable tablet Chew 1 tablet by mouth daily.   cetirizine (ZYRTEC) 10 MG tablet Take 10 mg by mouth daily with breakfast.    cholecalciferol (VITAMIN D) 1000 UNITS tablet Take 2,000 Units by mouth daily.    colestipol (COLESTID) 1 g tablet Take 1 g by mouth 3 (three) times daily.   Colloidal Oatmeal (AVEENO ECZEMA THERAPY CARE EX) Apply topically. Twice a day as needed   Cyanocobalamin (B-12) 1000 MCG TABS Take 1,000 mcg by mouth daily.    cycloSPORINE (RESTASIS) 0.05 % ophthalmic emulsion Place 1 drop into both eyes 2 (two) times daily.   docusate sodium (COLACE) 100 MG capsule Take 100 mg by mouth daily.   ferrous sulfate 325 (65 FE) MG tablet Take 325 mg by mouth daily.   fluticasone (FLONASE) 50 MCG/ACT nasal spray Place 1 spray into both nostrils 2 (two) times daily.    gabapentin (NEURONTIN) 300 MG capsule Take 300 mg by mouth See admin instructions. 300 mg three times a day and 600 mg at bedtime   gabapentin (NEURONTIN) 600 MG tablet Take 600 mg by mouth daily.   levETIRAcetam (KEPPRA) 250 MG tablet Take 250 mg by mouth See admin instructions. Take 1/2 tablet by mouth in the morning and 2 tablets by mouth in the evening.   loperamide (IMODIUM) 2 MG capsule Take 2 mg by mouth 2 (two) times daily as needed for diarrhea or loose stools.    memantine (NAMENDA) 10 MG tablet Take 10 mg by mouth 2 (two) times daily.   mirtazapine (REMERON) 7.5 MG tablet Take 7.5 mg by mouth at bedtime.   Multiple Vitamins-Minerals (MULTIVITAMIN WITH MINERALS) tablet Take 1 tablet by mouth daily.   Nutritional Supplements (BOOST PLUS PO) Take 1 Container by mouth daily with lunch.   pantoprazole (PROTONIX)  40 MG tablet  Take 40 mg by mouth at bedtime.      Allergies:   Avelox [moxifloxacin hcl in nacl], Moxifloxacin, Aricept [donepezil hcl], Codeine, Donepezil, Garlic, Latex, Onion, Sulfa drugs cross reactors, Duloxetine, and Polysporin [bacitracin-polymyxin b]   Social History   Tobacco Use   Smoking status: Former Smoker    Types: Cigarettes   Smokeless tobacco: Never Used   Tobacco comment: "quit smoking ~ 1948  Substance Use Topics   Alcohol use: No   Drug use: No     Family Hx: The patient's family history includes Arthritis in her sister; Cancer in her father; Coronary artery disease in her mother; Heart disease in her father; Hypertension in her father and mother; Pneumonia in her mother.  ROS:   Please see the history of present illness.    No fever chills or night sweats No cough Breathing well without wheezing Very mild shortness of breath over exert herself No palpitations No abdominal pain Recent left femoral head fracture Recent anemia postoperatively No presyncope or syncope No leg swelling No neurologic symptoms All other systems reviewed and are negative.   Prior CV studies:   The following studies were reviewed today:  ECHO 05/02/19 IMPRESSIONS  1. The left ventricle has hyperdynamic systolic function, with an ejection fraction of >65%. The cavity size was normal. Left ventricular diastolic Doppler parameters are consistent with pseudonormalization.  2. The right ventricle has normal systolic function. The cavity was normal. There is no increase in right ventricular wall thickness. Right ventricular systolic pressure is severely elevated with an estimated pressure of 67.6 mmHg.  3. Left atrial size was severely dilated.  4. The mitral valve is degenerative. Mild thickening of the mitral valve leaflet. Mild calcification of the mitral valve leaflet. There is moderate mitral annular calcification present. Mitral valve regurgitation is moderate to severe by color flow    Doppler.  5. Tricuspid valve regurgitation is moderate.  6. The aortic valve is tricuspid. Moderate thickening of the aortic valve. Moderate calcification of the aortic valve. Aortic valve regurgitation is trivial by color flow Doppler. Moderate stenosis of the aortic valve.  7. Peak velocity transaortic - 3.69m/s     Mean gradient - 71mmHg.  8. The aorta is abnormal unless otherwise noted.  9. Aortic atheroscleosis noted.   Labs/Other Tests and Data Reviewed:    EKG:  An ECG dated 05/02/2019 was personally reviewed today and demonstrated:  Normal sinus rhythm at 82 bpm.  Left axis deviation, nonspecific ST changes  Recent Labs: 01/26/2019: TSH 3.180 06/23/2019: ALT 7; BUN 12; Creatinine 0.6; Hemoglobin 10.9; Platelets 247; Potassium 4.1; Sodium 135   Recent Lipid Panel Lab Results  Component Value Date/Time   CHOL 111 12/11/2016 07:07 AM   TRIG 71 12/11/2016 07:07 AM   HDL 66 12/11/2016 07:07 AM   CHOLHDL 1.7 12/11/2016 07:07 AM   LDLCALC 31 12/11/2016 07:07 AM    Wt Readings from Last 3 Encounters:  09/17/19 110 lb (49.9 kg)  09/08/19 110 lb (49.9 kg)  08/28/19 110 lb 6.4 oz (50.1 kg)     Objective:    Vital Signs:  BP (!) 102/56    Pulse 67    Ht 5\' 2"  (1.575 m)    Wt 110 lb (49.9 kg)    BMI 20.12 kg/m     Since this was a telemedicine visit I could not personally examined the patient. Breathing was normal and not labored There were no areas of chest discomfort She denied  any leg swelling Palpation of her rhythm revealed this to be stable without extra beats She denied any neurologic symptoms She had normal affect and mood   ASSESSMENT & PLAN:    1. Essential hypertension: Blood pressure stable 2. PAF: Maintaining sinus rhythm.  ECG from hospitalization reviewed. 3. Aortic stenosis: Last echo Doppler from May 02, 2019 reviewed.  Hyperdynamic LV function with EF greater than 65%.  Moderate aortic stenosis with a mean gradient of 27. 4. Pulmonary hypertension:  Most recent echo Doppler study has shown slight further increase of PA pressure to 67 mm, increased from 56 mm previously.  Moderately severe mitral regurgitation. 5. Left femoral head fracture, status post left hip monopolar hemiarthroplasty May 02, 2019. 6. Postop anemia: Status post blood cell transfusion May 12, 2019.  Most recent hemoglobin on June 23, 2019 increased to 10.9 with hematocrit 33. 7. PVD: Recent carotid Doppler studies reviewed with subclavian stenosis;  medical therapy 8. Peripheral neuropathy: On gabapentin followed by Dr. Jannifer Franklin  COVID-19 Education: The signs and symptoms of COVID-19 were discussed with the patient and how to seek care for testing (follow up with PCP or arrange E-visit). The importance of social distancing was discussed today.  Time:   Today, I have spent 20 minutes with the patient with telehealth technology discussing the above problems.     Medication Adjustments/Labs and Tests Ordered: Current medicines are reviewed at length with the patient today.  Concerns regarding medicines are outlined above.   Tests Ordered: No orders of the defined types were placed in this encounter.   Medication Changes: No orders of the defined types were placed in this encounter.   Follow Up: 6 months in person evaluation if possible, otherwise virtual  Signed, Shelva Majestic, MD  09/17/2019 10:18 AM    [place]

## 2019-09-21 ENCOUNTER — Telehealth: Payer: Self-pay

## 2019-09-21 DIAGNOSIS — R2689 Other abnormalities of gait and mobility: Secondary | ICD-10-CM | POA: Diagnosis not present

## 2019-09-21 DIAGNOSIS — R2681 Unsteadiness on feet: Secondary | ICD-10-CM | POA: Diagnosis not present

## 2019-09-21 DIAGNOSIS — Z23 Encounter for immunization: Secondary | ICD-10-CM | POA: Diagnosis not present

## 2019-09-21 DIAGNOSIS — M6281 Muscle weakness (generalized): Secondary | ICD-10-CM | POA: Diagnosis not present

## 2019-09-21 DIAGNOSIS — R41841 Cognitive communication deficit: Secondary | ICD-10-CM | POA: Diagnosis not present

## 2019-09-21 DIAGNOSIS — M1990 Unspecified osteoarthritis, unspecified site: Secondary | ICD-10-CM | POA: Diagnosis not present

## 2019-09-21 NOTE — Telephone Encounter (Signed)
Patient called wanting to ask a coupld of questions about her levETIRAcetam (KEPPRA) 250 MG tablet dosage. Please follow up.

## 2019-09-21 NOTE — Telephone Encounter (Signed)
I called pt to give her the directions of her keppra. I stated per last phone note she is to take 1/2 in the am and 2 in the evening. I stated her son was called in august 2021 about the keppa directions.The pt will tell her son and verbalized understanding.Pt will call back to schedule a follow up appt after her second COVID vaccine.

## 2019-09-22 DIAGNOSIS — R2681 Unsteadiness on feet: Secondary | ICD-10-CM | POA: Diagnosis not present

## 2019-09-22 DIAGNOSIS — D649 Anemia, unspecified: Secondary | ICD-10-CM | POA: Diagnosis not present

## 2019-09-22 DIAGNOSIS — R41841 Cognitive communication deficit: Secondary | ICD-10-CM | POA: Diagnosis not present

## 2019-09-22 DIAGNOSIS — R2689 Other abnormalities of gait and mobility: Secondary | ICD-10-CM | POA: Diagnosis not present

## 2019-09-22 DIAGNOSIS — M1990 Unspecified osteoarthritis, unspecified site: Secondary | ICD-10-CM | POA: Diagnosis not present

## 2019-09-22 DIAGNOSIS — M6281 Muscle weakness (generalized): Secondary | ICD-10-CM | POA: Diagnosis not present

## 2019-09-22 DIAGNOSIS — L89623 Pressure ulcer of left heel, stage 3: Secondary | ICD-10-CM | POA: Diagnosis not present

## 2019-09-22 DIAGNOSIS — I5032 Chronic diastolic (congestive) heart failure: Secondary | ICD-10-CM | POA: Diagnosis not present

## 2019-09-22 DIAGNOSIS — G629 Polyneuropathy, unspecified: Secondary | ICD-10-CM | POA: Diagnosis not present

## 2019-09-23 DIAGNOSIS — R079 Chest pain, unspecified: Secondary | ICD-10-CM | POA: Diagnosis not present

## 2019-09-23 DIAGNOSIS — I10 Essential (primary) hypertension: Secondary | ICD-10-CM | POA: Diagnosis not present

## 2019-09-23 DIAGNOSIS — R41841 Cognitive communication deficit: Secondary | ICD-10-CM | POA: Diagnosis not present

## 2019-09-23 DIAGNOSIS — R2681 Unsteadiness on feet: Secondary | ICD-10-CM | POA: Diagnosis not present

## 2019-09-23 DIAGNOSIS — M6281 Muscle weakness (generalized): Secondary | ICD-10-CM | POA: Diagnosis not present

## 2019-09-23 DIAGNOSIS — R2689 Other abnormalities of gait and mobility: Secondary | ICD-10-CM | POA: Diagnosis not present

## 2019-09-23 DIAGNOSIS — M1990 Unspecified osteoarthritis, unspecified site: Secondary | ICD-10-CM | POA: Diagnosis not present

## 2019-09-23 DIAGNOSIS — R457 State of emotional shock and stress, unspecified: Secondary | ICD-10-CM | POA: Diagnosis not present

## 2019-09-23 DIAGNOSIS — R0789 Other chest pain: Secondary | ICD-10-CM | POA: Diagnosis not present

## 2019-09-24 DIAGNOSIS — Z4800 Encounter for change or removal of nonsurgical wound dressing: Secondary | ICD-10-CM | POA: Diagnosis not present

## 2019-09-29 DIAGNOSIS — Z4800 Encounter for change or removal of nonsurgical wound dressing: Secondary | ICD-10-CM | POA: Diagnosis not present

## 2019-09-30 DIAGNOSIS — L97429 Non-pressure chronic ulcer of left heel and midfoot with unspecified severity: Secondary | ICD-10-CM | POA: Diagnosis not present

## 2019-09-30 DIAGNOSIS — Z4800 Encounter for change or removal of nonsurgical wound dressing: Secondary | ICD-10-CM | POA: Diagnosis not present

## 2019-09-30 DIAGNOSIS — L89623 Pressure ulcer of left heel, stage 3: Secondary | ICD-10-CM | POA: Diagnosis not present

## 2019-09-30 DIAGNOSIS — E11621 Type 2 diabetes mellitus with foot ulcer: Secondary | ICD-10-CM | POA: Diagnosis not present

## 2019-10-01 DIAGNOSIS — Z4800 Encounter for change or removal of nonsurgical wound dressing: Secondary | ICD-10-CM | POA: Diagnosis not present

## 2019-10-02 DIAGNOSIS — Z4800 Encounter for change or removal of nonsurgical wound dressing: Secondary | ICD-10-CM | POA: Diagnosis not present

## 2019-10-05 DIAGNOSIS — Z4800 Encounter for change or removal of nonsurgical wound dressing: Secondary | ICD-10-CM | POA: Diagnosis not present

## 2019-10-06 DIAGNOSIS — L89623 Pressure ulcer of left heel, stage 3: Secondary | ICD-10-CM | POA: Diagnosis not present

## 2019-10-06 DIAGNOSIS — I110 Hypertensive heart disease with heart failure: Secondary | ICD-10-CM | POA: Diagnosis not present

## 2019-10-06 DIAGNOSIS — D649 Anemia, unspecified: Secondary | ICD-10-CM | POA: Diagnosis not present

## 2019-10-06 DIAGNOSIS — G629 Polyneuropathy, unspecified: Secondary | ICD-10-CM | POA: Diagnosis not present

## 2019-10-06 DIAGNOSIS — I5032 Chronic diastolic (congestive) heart failure: Secondary | ICD-10-CM | POA: Diagnosis not present

## 2019-10-07 DIAGNOSIS — Z4800 Encounter for change or removal of nonsurgical wound dressing: Secondary | ICD-10-CM | POA: Diagnosis not present

## 2019-10-08 DIAGNOSIS — Z4800 Encounter for change or removal of nonsurgical wound dressing: Secondary | ICD-10-CM | POA: Diagnosis not present

## 2019-10-09 DIAGNOSIS — Z4800 Encounter for change or removal of nonsurgical wound dressing: Secondary | ICD-10-CM | POA: Diagnosis not present

## 2019-10-12 DIAGNOSIS — Z4800 Encounter for change or removal of nonsurgical wound dressing: Secondary | ICD-10-CM | POA: Diagnosis not present

## 2019-10-13 DIAGNOSIS — H16223 Keratoconjunctivitis sicca, not specified as Sjogren's, bilateral: Secondary | ICD-10-CM | POA: Diagnosis not present

## 2019-10-13 DIAGNOSIS — H2513 Age-related nuclear cataract, bilateral: Secondary | ICD-10-CM | POA: Diagnosis not present

## 2019-10-13 DIAGNOSIS — H35412 Lattice degeneration of retina, left eye: Secondary | ICD-10-CM | POA: Diagnosis not present

## 2019-10-13 DIAGNOSIS — H401131 Primary open-angle glaucoma, bilateral, mild stage: Secondary | ICD-10-CM | POA: Diagnosis not present

## 2019-10-14 DIAGNOSIS — Z4800 Encounter for change or removal of nonsurgical wound dressing: Secondary | ICD-10-CM | POA: Diagnosis not present

## 2019-10-15 DIAGNOSIS — Z4800 Encounter for change or removal of nonsurgical wound dressing: Secondary | ICD-10-CM | POA: Diagnosis not present

## 2019-10-16 DIAGNOSIS — Z4800 Encounter for change or removal of nonsurgical wound dressing: Secondary | ICD-10-CM | POA: Diagnosis not present

## 2019-10-19 DIAGNOSIS — Z4800 Encounter for change or removal of nonsurgical wound dressing: Secondary | ICD-10-CM | POA: Diagnosis not present

## 2019-10-20 DIAGNOSIS — G629 Polyneuropathy, unspecified: Secondary | ICD-10-CM | POA: Diagnosis not present

## 2019-10-20 DIAGNOSIS — D649 Anemia, unspecified: Secondary | ICD-10-CM | POA: Diagnosis not present

## 2019-10-20 DIAGNOSIS — L89623 Pressure ulcer of left heel, stage 3: Secondary | ICD-10-CM | POA: Diagnosis not present

## 2019-10-20 DIAGNOSIS — I110 Hypertensive heart disease with heart failure: Secondary | ICD-10-CM | POA: Diagnosis not present

## 2019-10-20 DIAGNOSIS — I5032 Chronic diastolic (congestive) heart failure: Secondary | ICD-10-CM | POA: Diagnosis not present

## 2019-10-21 DIAGNOSIS — Z4800 Encounter for change or removal of nonsurgical wound dressing: Secondary | ICD-10-CM | POA: Diagnosis not present

## 2019-10-23 DIAGNOSIS — Z4800 Encounter for change or removal of nonsurgical wound dressing: Secondary | ICD-10-CM | POA: Diagnosis not present

## 2019-10-24 DIAGNOSIS — Z4800 Encounter for change or removal of nonsurgical wound dressing: Secondary | ICD-10-CM | POA: Diagnosis not present

## 2019-10-26 DIAGNOSIS — Z4800 Encounter for change or removal of nonsurgical wound dressing: Secondary | ICD-10-CM | POA: Diagnosis not present

## 2019-10-29 DIAGNOSIS — Z4800 Encounter for change or removal of nonsurgical wound dressing: Secondary | ICD-10-CM | POA: Diagnosis not present

## 2019-11-03 DIAGNOSIS — G629 Polyneuropathy, unspecified: Secondary | ICD-10-CM | POA: Diagnosis not present

## 2019-11-03 DIAGNOSIS — D649 Anemia, unspecified: Secondary | ICD-10-CM | POA: Diagnosis not present

## 2019-11-03 DIAGNOSIS — I5032 Chronic diastolic (congestive) heart failure: Secondary | ICD-10-CM | POA: Diagnosis not present

## 2019-11-03 DIAGNOSIS — L89623 Pressure ulcer of left heel, stage 3: Secondary | ICD-10-CM | POA: Diagnosis not present

## 2019-11-04 DIAGNOSIS — Z4800 Encounter for change or removal of nonsurgical wound dressing: Secondary | ICD-10-CM | POA: Diagnosis not present

## 2019-11-05 DIAGNOSIS — H5000 Unspecified esotropia: Secondary | ICD-10-CM | POA: Diagnosis not present

## 2019-11-05 DIAGNOSIS — H532 Diplopia: Secondary | ICD-10-CM | POA: Diagnosis not present

## 2019-11-06 DIAGNOSIS — Z4800 Encounter for change or removal of nonsurgical wound dressing: Secondary | ICD-10-CM | POA: Diagnosis not present

## 2019-11-09 DIAGNOSIS — Z4800 Encounter for change or removal of nonsurgical wound dressing: Secondary | ICD-10-CM | POA: Diagnosis not present

## 2019-11-10 DIAGNOSIS — Z4800 Encounter for change or removal of nonsurgical wound dressing: Secondary | ICD-10-CM | POA: Diagnosis not present

## 2019-11-18 DIAGNOSIS — Z4800 Encounter for change or removal of nonsurgical wound dressing: Secondary | ICD-10-CM | POA: Diagnosis not present

## 2019-11-23 DIAGNOSIS — Z4800 Encounter for change or removal of nonsurgical wound dressing: Secondary | ICD-10-CM | POA: Diagnosis not present

## 2019-11-26 DIAGNOSIS — L97422 Non-pressure chronic ulcer of left heel and midfoot with fat layer exposed: Secondary | ICD-10-CM | POA: Diagnosis not present

## 2019-11-26 DIAGNOSIS — D649 Anemia, unspecified: Secondary | ICD-10-CM | POA: Diagnosis not present

## 2019-11-26 DIAGNOSIS — L89623 Pressure ulcer of left heel, stage 3: Secondary | ICD-10-CM | POA: Diagnosis not present

## 2019-11-26 DIAGNOSIS — I5032 Chronic diastolic (congestive) heart failure: Secondary | ICD-10-CM | POA: Diagnosis not present

## 2019-11-26 DIAGNOSIS — G629 Polyneuropathy, unspecified: Secondary | ICD-10-CM | POA: Diagnosis not present

## 2019-11-27 DIAGNOSIS — M79672 Pain in left foot: Secondary | ICD-10-CM | POA: Diagnosis not present

## 2019-11-27 DIAGNOSIS — M79671 Pain in right foot: Secondary | ICD-10-CM | POA: Diagnosis not present

## 2019-11-27 DIAGNOSIS — B351 Tinea unguium: Secondary | ICD-10-CM | POA: Diagnosis not present

## 2019-11-27 DIAGNOSIS — Z4800 Encounter for change or removal of nonsurgical wound dressing: Secondary | ICD-10-CM | POA: Diagnosis not present

## 2019-12-01 DIAGNOSIS — Z4800 Encounter for change or removal of nonsurgical wound dressing: Secondary | ICD-10-CM | POA: Diagnosis not present

## 2019-12-03 DIAGNOSIS — D1801 Hemangioma of skin and subcutaneous tissue: Secondary | ICD-10-CM | POA: Diagnosis not present

## 2019-12-03 DIAGNOSIS — L814 Other melanin hyperpigmentation: Secondary | ICD-10-CM | POA: Diagnosis not present

## 2019-12-03 DIAGNOSIS — L570 Actinic keratosis: Secondary | ICD-10-CM | POA: Diagnosis not present

## 2019-12-03 DIAGNOSIS — Z85828 Personal history of other malignant neoplasm of skin: Secondary | ICD-10-CM | POA: Diagnosis not present

## 2019-12-03 DIAGNOSIS — D692 Other nonthrombocytopenic purpura: Secondary | ICD-10-CM | POA: Diagnosis not present

## 2019-12-10 DIAGNOSIS — Z4800 Encounter for change or removal of nonsurgical wound dressing: Secondary | ICD-10-CM | POA: Diagnosis not present

## 2019-12-18 DIAGNOSIS — Z4800 Encounter for change or removal of nonsurgical wound dressing: Secondary | ICD-10-CM | POA: Diagnosis not present

## 2019-12-21 DIAGNOSIS — E785 Hyperlipidemia, unspecified: Secondary | ICD-10-CM | POA: Diagnosis not present

## 2019-12-21 DIAGNOSIS — G629 Polyneuropathy, unspecified: Secondary | ICD-10-CM | POA: Diagnosis not present

## 2019-12-21 DIAGNOSIS — Z91018 Allergy to other foods: Secondary | ICD-10-CM | POA: Diagnosis not present

## 2019-12-21 DIAGNOSIS — D649 Anemia, unspecified: Secondary | ICD-10-CM | POA: Diagnosis not present

## 2019-12-21 DIAGNOSIS — L89623 Pressure ulcer of left heel, stage 3: Secondary | ICD-10-CM | POA: Diagnosis not present

## 2019-12-21 DIAGNOSIS — I10 Essential (primary) hypertension: Secondary | ICD-10-CM | POA: Diagnosis not present

## 2019-12-21 DIAGNOSIS — I5032 Chronic diastolic (congestive) heart failure: Secondary | ICD-10-CM | POA: Diagnosis not present

## 2019-12-23 DIAGNOSIS — Z4800 Encounter for change or removal of nonsurgical wound dressing: Secondary | ICD-10-CM | POA: Diagnosis not present

## 2019-12-28 DIAGNOSIS — J45909 Unspecified asthma, uncomplicated: Secondary | ICD-10-CM | POA: Diagnosis not present

## 2019-12-28 DIAGNOSIS — G629 Polyneuropathy, unspecified: Secondary | ICD-10-CM | POA: Diagnosis not present

## 2019-12-28 DIAGNOSIS — I272 Pulmonary hypertension, unspecified: Secondary | ICD-10-CM | POA: Diagnosis not present

## 2019-12-28 DIAGNOSIS — R3129 Other microscopic hematuria: Secondary | ICD-10-CM | POA: Diagnosis not present

## 2019-12-28 DIAGNOSIS — Z0000 Encounter for general adult medical examination without abnormal findings: Secondary | ICD-10-CM | POA: Diagnosis not present

## 2019-12-28 DIAGNOSIS — L97429 Non-pressure chronic ulcer of left heel and midfoot with unspecified severity: Secondary | ICD-10-CM | POA: Diagnosis not present

## 2019-12-28 DIAGNOSIS — R5383 Other fatigue: Secondary | ICD-10-CM | POA: Diagnosis not present

## 2019-12-28 DIAGNOSIS — E46 Unspecified protein-calorie malnutrition: Secondary | ICD-10-CM | POA: Diagnosis not present

## 2019-12-28 DIAGNOSIS — I10 Essential (primary) hypertension: Secondary | ICD-10-CM | POA: Diagnosis not present

## 2019-12-28 DIAGNOSIS — R5381 Other malaise: Secondary | ICD-10-CM | POA: Diagnosis not present

## 2019-12-28 DIAGNOSIS — E7800 Pure hypercholesterolemia, unspecified: Secondary | ICD-10-CM | POA: Diagnosis not present

## 2019-12-28 DIAGNOSIS — E785 Hyperlipidemia, unspecified: Secondary | ICD-10-CM | POA: Diagnosis not present

## 2019-12-28 DIAGNOSIS — Z91018 Allergy to other foods: Secondary | ICD-10-CM | POA: Diagnosis not present

## 2019-12-28 DIAGNOSIS — I4891 Unspecified atrial fibrillation: Secondary | ICD-10-CM | POA: Diagnosis not present

## 2019-12-29 DIAGNOSIS — Z4800 Encounter for change or removal of nonsurgical wound dressing: Secondary | ICD-10-CM | POA: Diagnosis not present

## 2020-01-04 DIAGNOSIS — I110 Hypertensive heart disease with heart failure: Secondary | ICD-10-CM | POA: Diagnosis not present

## 2020-01-04 DIAGNOSIS — G629 Polyneuropathy, unspecified: Secondary | ICD-10-CM | POA: Diagnosis not present

## 2020-01-04 DIAGNOSIS — I5032 Chronic diastolic (congestive) heart failure: Secondary | ICD-10-CM | POA: Diagnosis not present

## 2020-01-04 DIAGNOSIS — L89623 Pressure ulcer of left heel, stage 3: Secondary | ICD-10-CM | POA: Diagnosis not present

## 2020-01-04 DIAGNOSIS — D649 Anemia, unspecified: Secondary | ICD-10-CM | POA: Diagnosis not present

## 2020-01-04 DIAGNOSIS — E441 Mild protein-calorie malnutrition: Secondary | ICD-10-CM | POA: Diagnosis not present

## 2020-01-05 DIAGNOSIS — Z4800 Encounter for change or removal of nonsurgical wound dressing: Secondary | ICD-10-CM | POA: Diagnosis not present

## 2020-01-15 DIAGNOSIS — Z4800 Encounter for change or removal of nonsurgical wound dressing: Secondary | ICD-10-CM | POA: Diagnosis not present

## 2020-01-20 ENCOUNTER — Other Ambulatory Visit: Payer: Self-pay | Admitting: Neurology

## 2020-01-20 DIAGNOSIS — Z4800 Encounter for change or removal of nonsurgical wound dressing: Secondary | ICD-10-CM | POA: Diagnosis not present

## 2020-01-22 DIAGNOSIS — Z4800 Encounter for change or removal of nonsurgical wound dressing: Secondary | ICD-10-CM | POA: Diagnosis not present

## 2020-02-01 DIAGNOSIS — I5032 Chronic diastolic (congestive) heart failure: Secondary | ICD-10-CM | POA: Diagnosis not present

## 2020-02-01 DIAGNOSIS — L89623 Pressure ulcer of left heel, stage 3: Secondary | ICD-10-CM | POA: Diagnosis not present

## 2020-02-01 DIAGNOSIS — I110 Hypertensive heart disease with heart failure: Secondary | ICD-10-CM | POA: Diagnosis not present

## 2020-02-01 DIAGNOSIS — D649 Anemia, unspecified: Secondary | ICD-10-CM | POA: Diagnosis not present

## 2020-02-01 DIAGNOSIS — G629 Polyneuropathy, unspecified: Secondary | ICD-10-CM | POA: Diagnosis not present

## 2020-02-15 ENCOUNTER — Other Ambulatory Visit: Payer: Self-pay | Admitting: *Deleted

## 2020-02-15 ENCOUNTER — Other Ambulatory Visit: Payer: Self-pay | Admitting: Gastroenterology

## 2020-02-15 ENCOUNTER — Telehealth: Payer: Self-pay | Admitting: Neurology

## 2020-02-15 DIAGNOSIS — R109 Unspecified abdominal pain: Secondary | ICD-10-CM

## 2020-02-15 DIAGNOSIS — R634 Abnormal weight loss: Secondary | ICD-10-CM

## 2020-02-15 MED ORDER — GABAPENTIN 300 MG PO CAPS: ORAL_CAPSULE | ORAL | 0 refills | Status: DC

## 2020-02-15 MED ORDER — GABAPENTIN 300 MG PO CAPS: 300.0000 mg | ORAL_CAPSULE | Freq: Three times a day (TID) | ORAL | 0 refills | Status: DC

## 2020-02-15 NOTE — Telephone Encounter (Signed)
I returned the call to the patient's son. He would like a prescription sent to [facility] to avoid disruption to treatment. He manages her medications and confirmed that she is currently taking gabapentin 300mg , one capsule TID and three capsules QHS.   He also needs a new rx sent to [facility] so that he can place an order for delivery. They informed him that after receiving the prescription, it may take about two weeks.  Dr. Felecia Shelling has reviewed the patient's chart and gabapentin dosage. Per his verbal orders, a 30-day rx has been sent to [facility] and a 90-day rx has been sent to [facility].

## 2020-02-15 NOTE — Telephone Encounter (Signed)
Patient's son called requesting a refill of patient's Gabapentin. He states that [facility] mail order pharmacy is supposed to deliver it on Friday, but he states patient is currently out of it and in severe pain. He is requesting a refill of it sent to [facility] on [address] with enough to get her to Friday.

## 2020-02-21 DIAGNOSIS — I2510 Atherosclerotic heart disease of native coronary artery without angina pectoris: Secondary | ICD-10-CM | POA: Diagnosis not present

## 2020-02-21 DIAGNOSIS — I270 Primary pulmonary hypertension: Secondary | ICD-10-CM | POA: Diagnosis not present

## 2020-02-21 DIAGNOSIS — L89622 Pressure ulcer of left heel, stage 2: Secondary | ICD-10-CM | POA: Diagnosis not present

## 2020-02-21 DIAGNOSIS — D649 Anemia, unspecified: Secondary | ICD-10-CM | POA: Diagnosis not present

## 2020-02-21 DIAGNOSIS — G629 Polyneuropathy, unspecified: Secondary | ICD-10-CM | POA: Diagnosis not present

## 2020-02-26 DIAGNOSIS — I270 Primary pulmonary hypertension: Secondary | ICD-10-CM | POA: Diagnosis not present

## 2020-02-26 DIAGNOSIS — D649 Anemia, unspecified: Secondary | ICD-10-CM | POA: Diagnosis not present

## 2020-02-26 DIAGNOSIS — L89622 Pressure ulcer of left heel, stage 2: Secondary | ICD-10-CM | POA: Diagnosis not present

## 2020-02-26 DIAGNOSIS — G629 Polyneuropathy, unspecified: Secondary | ICD-10-CM | POA: Diagnosis not present

## 2020-02-26 DIAGNOSIS — I2510 Atherosclerotic heart disease of native coronary artery without angina pectoris: Secondary | ICD-10-CM | POA: Diagnosis not present

## 2020-03-01 DIAGNOSIS — D649 Anemia, unspecified: Secondary | ICD-10-CM | POA: Diagnosis not present

## 2020-03-01 DIAGNOSIS — L89622 Pressure ulcer of left heel, stage 2: Secondary | ICD-10-CM | POA: Diagnosis not present

## 2020-03-01 DIAGNOSIS — G629 Polyneuropathy, unspecified: Secondary | ICD-10-CM | POA: Diagnosis not present

## 2020-03-01 DIAGNOSIS — I2510 Atherosclerotic heart disease of native coronary artery without angina pectoris: Secondary | ICD-10-CM | POA: Diagnosis not present

## 2020-03-01 DIAGNOSIS — I270 Primary pulmonary hypertension: Secondary | ICD-10-CM | POA: Diagnosis not present

## 2020-03-03 ENCOUNTER — Ambulatory Visit
Admission: RE | Admit: 2020-03-03 | Discharge: 2020-03-03 | Disposition: A | Discharge status: Home / Self Care | Payer: Medicare Other | Source: Ambulatory Visit | Attending: Gastroenterology | Admitting: Gastroenterology

## 2020-03-03 DIAGNOSIS — R109 Unspecified abdominal pain: Secondary | ICD-10-CM

## 2020-03-03 DIAGNOSIS — N281 Cyst of kidney, acquired: Secondary | ICD-10-CM | POA: Diagnosis not present

## 2020-03-03 DIAGNOSIS — I700 Atherosclerosis of aorta: Secondary | ICD-10-CM | POA: Diagnosis not present

## 2020-03-03 DIAGNOSIS — K5730 Diverticulosis of large intestine without perforation or abscess without bleeding: Secondary | ICD-10-CM | POA: Diagnosis not present

## 2020-03-03 DIAGNOSIS — R634 Abnormal weight loss: Secondary | ICD-10-CM

## 2020-03-03 DIAGNOSIS — K449 Diaphragmatic hernia without obstruction or gangrene: Secondary | ICD-10-CM | POA: Diagnosis not present

## 2020-03-03 MED ORDER — IOPAMIDOL (ISOVUE-300) INJECTION 61%
100.0000 mL | Freq: Once | INTRAVENOUS | Status: AC | PRN
  Administered 2020-03-03: 100 mL via INTRAVENOUS

## 2020-03-08 DIAGNOSIS — L89622 Pressure ulcer of left heel, stage 2: Secondary | ICD-10-CM | POA: Diagnosis not present

## 2020-03-08 DIAGNOSIS — I2510 Atherosclerotic heart disease of native coronary artery without angina pectoris: Secondary | ICD-10-CM | POA: Diagnosis not present

## 2020-03-08 DIAGNOSIS — G629 Polyneuropathy, unspecified: Secondary | ICD-10-CM | POA: Diagnosis not present

## 2020-03-08 DIAGNOSIS — D649 Anemia, unspecified: Secondary | ICD-10-CM | POA: Diagnosis not present

## 2020-03-08 DIAGNOSIS — I270 Primary pulmonary hypertension: Secondary | ICD-10-CM | POA: Diagnosis not present

## 2020-03-11 ENCOUNTER — Other Ambulatory Visit: Payer: Self-pay | Admitting: Neurology

## 2020-03-16 DIAGNOSIS — I270 Primary pulmonary hypertension: Secondary | ICD-10-CM | POA: Diagnosis not present

## 2020-03-16 DIAGNOSIS — I2510 Atherosclerotic heart disease of native coronary artery without angina pectoris: Secondary | ICD-10-CM | POA: Diagnosis not present

## 2020-03-16 DIAGNOSIS — G629 Polyneuropathy, unspecified: Secondary | ICD-10-CM | POA: Diagnosis not present

## 2020-03-16 DIAGNOSIS — D649 Anemia, unspecified: Secondary | ICD-10-CM | POA: Diagnosis not present

## 2020-03-16 DIAGNOSIS — L89622 Pressure ulcer of left heel, stage 2: Secondary | ICD-10-CM | POA: Diagnosis not present

## 2020-03-22 DIAGNOSIS — R5381 Other malaise: Secondary | ICD-10-CM | POA: Diagnosis not present

## 2020-03-22 DIAGNOSIS — F5082 Avoidant/restrictive food intake disorder: Secondary | ICD-10-CM | POA: Diagnosis not present

## 2020-04-01 ENCOUNTER — Telehealth: Payer: Self-pay | Admitting: Cardiovascular Disease

## 2020-04-01 NOTE — Telephone Encounter (Signed)
Spoke with patient to try get follow up scheduled with [name] from recall list, patient stated they wasn't feeling good and would call back in to get that appointment scheduled as soon as they start to feel better

## 2020-04-04 ENCOUNTER — Other Ambulatory Visit: Payer: Self-pay | Admitting: Cardiovascular Disease

## 2020-04-04 MED ORDER — AMIODARONE HCL 100 MG PO TABS: 100.0000 mg | ORAL_TABLET | Freq: Every day | ORAL | 1 refills | Status: DC

## 2020-04-04 NOTE — Telephone Encounter (Signed)
*  STAT* If patient is at the pharmacy, call can be transferred to refill team.   1. Which medications need to be refilled? (please list name of each medication and dose if known)  amiodarone (PACERONE) 100 MG tablet  2. Which pharmacy/location (including street and city if local pharmacy) is medication to be sent to? [place], [place]  3. Do they need a 30 day or 90 day supply? 90 day supply  [name] has an appointment scheduled for 07/25/20.

## 2020-04-04 NOTE — Telephone Encounter (Signed)
Rx(s) sent to pharmacy electronically.  

## 2020-04-05 ENCOUNTER — Other Ambulatory Visit: Payer: Self-pay

## 2020-04-09 ENCOUNTER — Other Ambulatory Visit: Payer: Self-pay

## 2020-04-09 ENCOUNTER — Emergency Department (HOSPITAL_BASED_OUTPATIENT_CLINIC_OR_DEPARTMENT_OTHER): Payer: Medicare Other

## 2020-04-09 ENCOUNTER — Encounter (HOSPITAL_BASED_OUTPATIENT_CLINIC_OR_DEPARTMENT_OTHER): Payer: Self-pay | Admitting: Emergency Medicine

## 2020-04-09 ENCOUNTER — Emergency Department (HOSPITAL_BASED_OUTPATIENT_CLINIC_OR_DEPARTMENT_OTHER)
Admission: EM | Admit: 2020-04-09 | Discharge: 2020-04-09 | Disposition: A | Discharge status: Home / Self Care | Payer: Medicare Other | Attending: Emergency Medicine | Admitting: Emergency Medicine

## 2020-04-09 DIAGNOSIS — I700 Atherosclerosis of aorta: Secondary | ICD-10-CM | POA: Diagnosis not present

## 2020-04-09 DIAGNOSIS — Z96651 Presence of right artificial knee joint: Secondary | ICD-10-CM | POA: Diagnosis not present

## 2020-04-09 DIAGNOSIS — Z7901 Long term (current) use of anticoagulants: Secondary | ICD-10-CM | POA: Diagnosis not present

## 2020-04-09 DIAGNOSIS — R110 Nausea: Secondary | ICD-10-CM | POA: Diagnosis not present

## 2020-04-09 DIAGNOSIS — Z91040 Latex allergy status: Secondary | ICD-10-CM | POA: Insufficient documentation

## 2020-04-09 DIAGNOSIS — Z87738 Personal history of other specified (corrected) congenital malformations of digestive system: Secondary | ICD-10-CM | POA: Diagnosis not present

## 2020-04-09 DIAGNOSIS — R1084 Generalized abdominal pain: Secondary | ICD-10-CM | POA: Diagnosis not present

## 2020-04-09 DIAGNOSIS — Z85828 Personal history of other malignant neoplasm of skin: Secondary | ICD-10-CM | POA: Insufficient documentation

## 2020-04-09 DIAGNOSIS — I2510 Atherosclerotic heart disease of native coronary artery without angina pectoris: Secondary | ICD-10-CM | POA: Diagnosis not present

## 2020-04-09 DIAGNOSIS — I509 Heart failure, unspecified: Secondary | ICD-10-CM | POA: Insufficient documentation

## 2020-04-09 DIAGNOSIS — Z79899 Other long term (current) drug therapy: Secondary | ICD-10-CM | POA: Insufficient documentation

## 2020-04-09 DIAGNOSIS — R197 Diarrhea, unspecified: Secondary | ICD-10-CM | POA: Diagnosis not present

## 2020-04-09 DIAGNOSIS — E278 Other specified disorders of adrenal gland: Secondary | ICD-10-CM | POA: Diagnosis not present

## 2020-04-09 DIAGNOSIS — Z7982 Long term (current) use of aspirin: Secondary | ICD-10-CM | POA: Diagnosis not present

## 2020-04-09 DIAGNOSIS — Z87891 Personal history of nicotine dependence: Secondary | ICD-10-CM | POA: Diagnosis not present

## 2020-04-09 DIAGNOSIS — R112 Nausea with vomiting, unspecified: Secondary | ICD-10-CM | POA: Diagnosis not present

## 2020-04-09 DIAGNOSIS — Z20822 Contact with and (suspected) exposure to covid-19: Secondary | ICD-10-CM | POA: Insufficient documentation

## 2020-04-09 DIAGNOSIS — Z96642 Presence of left artificial hip joint: Secondary | ICD-10-CM | POA: Insufficient documentation

## 2020-04-09 DIAGNOSIS — I110 Hypertensive heart disease with heart failure: Secondary | ICD-10-CM | POA: Insufficient documentation

## 2020-04-09 DIAGNOSIS — K449 Diaphragmatic hernia without obstruction or gangrene: Secondary | ICD-10-CM | POA: Diagnosis not present

## 2020-04-09 DIAGNOSIS — R52 Pain, unspecified: Secondary | ICD-10-CM | POA: Diagnosis not present

## 2020-04-09 DIAGNOSIS — K5730 Diverticulosis of large intestine without perforation or abscess without bleeding: Secondary | ICD-10-CM | POA: Diagnosis not present

## 2020-04-09 LAB — CBC WITH DIFFERENTIAL/PLATELET
Abs Immature Granulocytes: 0.02 10*3/uL (ref 0.00–0.07)
Basophils Absolute: 0.1 10*3/uL (ref 0.0–0.1)
Basophils Relative: 1 %
Eosinophils Absolute: 0.3 10*3/uL (ref 0.0–0.5)
Eosinophils Relative: 4 %
HCT: 40.6 % (ref 36.0–46.0)
Hemoglobin: 13.8 g/dL (ref 12.0–15.0)
Immature Granulocytes: 0 %
Lymphocytes Relative: 11 %
Lymphs Abs: 0.9 10*3/uL (ref 0.7–4.0)
MCH: 31.7 pg (ref 26.0–34.0)
MCHC: 34 g/dL (ref 30.0–36.0)
MCV: 93.3 fL (ref 80.0–100.0)
Monocytes Absolute: 0.6 10*3/uL (ref 0.1–1.0)
Monocytes Relative: 7 %
Neutro Abs: 6.4 10*3/uL (ref 1.7–7.7)
Neutrophils Relative %: 77 %
Platelets: 232 10*3/uL (ref 150–400)
RBC: 4.35 MIL/uL (ref 3.87–5.11)
RDW: 12.5 % (ref 11.5–15.5)
WBC: 8.4 10*3/uL (ref 4.0–10.5)
nRBC: 0 % (ref 0.0–0.2)

## 2020-04-09 LAB — HEPATIC FUNCTION PANEL
ALT: 13 U/L (ref 0–44)
AST: 21 U/L (ref 15–41)
Albumin: 3.6 g/dL (ref 3.5–5.0)
Alkaline Phosphatase: 60 U/L (ref 38–126)
Bilirubin, Direct: 0.1 mg/dL (ref 0.0–0.2)
Indirect Bilirubin: 0.6 mg/dL (ref 0.3–0.9)
Total Bilirubin: 0.7 mg/dL (ref 0.3–1.2)
Total Protein: 7.1 g/dL (ref 6.5–8.1)

## 2020-04-09 LAB — URINALYSIS, ROUTINE W REFLEX MICROSCOPIC
Bilirubin Urine: NEGATIVE
Glucose, UA: NEGATIVE mg/dL
Ketones, ur: NEGATIVE mg/dL
Leukocytes,Ua: NEGATIVE
Nitrite: NEGATIVE
Protein, ur: NEGATIVE mg/dL
Specific Gravity, Urine: <1.005 — ABNORMAL LOW (ref 1.005–1.030)
pH: 6 (ref 5.0–8.0)

## 2020-04-09 LAB — BASIC METABOLIC PANEL
Anion gap: 9 (ref 5–15)
BUN: 13 mg/dL (ref 8–23)
CO2: 27 mmol/L (ref 22–32)
Calcium: 9.1 mg/dL (ref 8.9–10.3)
Chloride: 96 mmol/L — ABNORMAL LOW (ref 98–111)
Creatinine, Ser: 0.53 mg/dL (ref 0.44–1.00)
GFR calc Af Amer: >60 mL/min (ref >60)
GFR calc non Af Amer: >60 mL/min (ref >60)
Glucose, Bld: 96 mg/dL (ref 70–99)
Potassium: 3.8 mmol/L (ref 3.5–5.1)
Sodium: 132 mmol/L — ABNORMAL LOW (ref 135–145)

## 2020-04-09 LAB — URINALYSIS, MICROSCOPIC (REFLEX)

## 2020-04-09 LAB — LIPASE, BLOOD: Lipase: 30 U/L (ref 11–51)

## 2020-04-09 LAB — SARS CORONAVIRUS 2 BY RT PCR (HOSPITAL ORDER, PERFORMED IN ~~LOC~~ HOSPITAL LAB): SARS Coronavirus 2: NEGATIVE

## 2020-04-09 MED ORDER — ONDANSETRON HCL 4 MG PO TABS: 4.0000 mg | ORAL_TABLET | Freq: Three times a day (TID) | ORAL | 0 refills | Status: AC | PRN

## 2020-04-09 MED ORDER — SODIUM CHLORIDE 0.9 % IV BOLUS
500.0000 mL | Freq: Once | INTRAVENOUS | Status: AC
  Administered 2020-04-09: 500 mL via INTRAVENOUS

## 2020-04-09 MED ORDER — IOHEXOL 300 MG/ML  SOLN
100.0000 mL | Freq: Once | INTRAMUSCULAR | Status: AC | PRN
  Administered 2020-04-09: 80 mL via INTRAVENOUS

## 2020-04-09 MED ORDER — ONDANSETRON HCL 4 MG/2ML IJ SOLN
4.0000 mg | Freq: Once | INTRAMUSCULAR | Status: AC
  Administered 2020-04-09: 4 mg via INTRAVENOUS
  Filled 2020-04-09: qty 2

## 2020-04-09 NOTE — Discharge Instructions (Addendum)
Take Zofran as needed.  Work-up today was overall unremarkable.  He did have an incidental finding on your adrenal gland of an indeterminate nodule.  Please talk with your primary care doctor needs any further work-up.  This is likely an incidental finding and do not believe it is the cause of any of your symptoms today.
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
## 2020-04-09 NOTE — ED Triage Notes (Signed)
Abdominal pain since last night.  Multiple episodes of diarrhea and nausea but no vomiting. Pain is much better this morning than it was last night.

## 2020-04-09 NOTE — ED Notes (Signed)
ED Provider at bedside discussing test results and dispo plan of care with pt and her son.

## 2020-04-09 NOTE — ED Notes (Signed)
ED Provider at bedside. 

## 2020-04-10 LAB — URINE CULTURE: Culture: <10000 — AB

## 2020-04-12 ENCOUNTER — Telehealth: Payer: Self-pay | Admitting: Cardiovascular Disease

## 2020-04-12 ENCOUNTER — Other Ambulatory Visit: Payer: Self-pay

## 2020-04-12 ENCOUNTER — Telehealth: Payer: Self-pay | Admitting: Neurology

## 2020-04-12 NOTE — Telephone Encounter (Signed)
Pt is needing a refill on her memantine (NAMENDA) 10 MG tablet sent in to the [facility] mail order. Pts son has scheduled a f/u appt for pt. He states [employer] will be faxing a medication request as well.

## 2020-04-12 NOTE — Telephone Encounter (Signed)
*  STAT* If patient is at the pharmacy, call can be transferred to refill team.   1. Which medications need to be refilled? (please list name of each medication and dose if known) need new prescription for Amiodarone  2. Which pharmacy/location (including street and city if local pharmacy) is medication to be sent to? [facility]  3. Do they need a 30 day or 90 day supply? 90 days and refills

## 2020-04-12 NOTE — Telephone Encounter (Signed)
Reviewed patient's chart for Namenda 10mg .  Last refill was on 03/03/19 for 180 tabs with 2 refills. RX was discontinued on accident on 05/05/19 (by another office) but then added back to patient's medication list.   Patient's follow up is scheduled for 07/05/20 with Judson Roch.   Judson Roch, would you be ok with [place] sending in a one time refill to last until her appointment?

## 2020-04-12 NOTE — Telephone Encounter (Signed)
ATC patient's son back but he did not answer and his VM is full. Will verify the instructions with him before sending the RX over.

## 2020-04-15 ENCOUNTER — Other Ambulatory Visit: Payer: Self-pay

## 2020-04-18 ENCOUNTER — Other Ambulatory Visit: Payer: Self-pay

## 2020-04-21 ENCOUNTER — Telehealth: Payer: Self-pay | Admitting: Cardiovascular Disease

## 2020-04-21 MED ORDER — MEMANTINE HCL 10 MG PO TABS: 10.0000 mg | ORAL_TABLET | Freq: Two times a day (BID) | ORAL | 0 refills | Status: DC

## 2020-04-21 NOTE — Telephone Encounter (Signed)
    I went in pt's chart to see if her medicine had been called in

## 2020-04-21 NOTE — Telephone Encounter (Signed)
Spoke with patient's son Rush Landmark. RX has been sent to [facility] on [address].

## 2020-04-26 ENCOUNTER — Other Ambulatory Visit: Payer: Self-pay

## 2020-04-26 DIAGNOSIS — R197 Diarrhea, unspecified: Secondary | ICD-10-CM | POA: Diagnosis not present

## 2020-04-26 DIAGNOSIS — R634 Abnormal weight loss: Secondary | ICD-10-CM | POA: Diagnosis not present

## 2020-05-05 ENCOUNTER — Other Ambulatory Visit: Payer: Self-pay

## 2020-05-05 ENCOUNTER — Emergency Department (HOSPITAL_COMMUNITY)
Admission: EM | Admit: 2020-05-05 | Discharge: 2020-05-06 | Disposition: A | Discharge status: Home / Self Care | Payer: Medicare Other | Attending: Emergency Medicine | Admitting: Emergency Medicine

## 2020-05-05 ENCOUNTER — Encounter (HOSPITAL_COMMUNITY): Payer: Self-pay | Admitting: Emergency Medicine

## 2020-05-05 ENCOUNTER — Emergency Department (HOSPITAL_COMMUNITY): Payer: Medicare Other

## 2020-05-05 DIAGNOSIS — M7989 Other specified soft tissue disorders: Secondary | ICD-10-CM | POA: Diagnosis not present

## 2020-05-05 DIAGNOSIS — S300XXA Contusion of lower back and pelvis, initial encounter: Secondary | ICD-10-CM

## 2020-05-05 DIAGNOSIS — M1712 Unilateral primary osteoarthritis, left knee: Secondary | ICD-10-CM | POA: Diagnosis not present

## 2020-05-05 DIAGNOSIS — W010XXA Fall on same level from slipping, tripping and stumbling without subsequent striking against object, initial encounter: Secondary | ICD-10-CM | POA: Insufficient documentation

## 2020-05-05 DIAGNOSIS — R52 Pain, unspecified: Secondary | ICD-10-CM | POA: Diagnosis not present

## 2020-05-05 DIAGNOSIS — M4186 Other forms of scoliosis, lumbar region: Secondary | ICD-10-CM | POA: Diagnosis not present

## 2020-05-05 DIAGNOSIS — Z96642 Presence of left artificial hip joint: Secondary | ICD-10-CM | POA: Insufficient documentation

## 2020-05-05 DIAGNOSIS — Z87891 Personal history of nicotine dependence: Secondary | ICD-10-CM | POA: Diagnosis not present

## 2020-05-05 DIAGNOSIS — M47817 Spondylosis without myelopathy or radiculopathy, lumbosacral region: Secondary | ICD-10-CM | POA: Diagnosis not present

## 2020-05-05 DIAGNOSIS — Z91040 Latex allergy status: Secondary | ICD-10-CM | POA: Diagnosis not present

## 2020-05-05 DIAGNOSIS — M533 Sacrococcygeal disorders, not elsewhere classified: Secondary | ICD-10-CM | POA: Diagnosis not present

## 2020-05-05 DIAGNOSIS — Z96651 Presence of right artificial knee joint: Secondary | ICD-10-CM | POA: Insufficient documentation

## 2020-05-05 DIAGNOSIS — I110 Hypertensive heart disease with heart failure: Secondary | ICD-10-CM | POA: Diagnosis not present

## 2020-05-05 DIAGNOSIS — Z79899 Other long term (current) drug therapy: Secondary | ICD-10-CM | POA: Insufficient documentation

## 2020-05-05 DIAGNOSIS — S8002XA Contusion of left knee, initial encounter: Secondary | ICD-10-CM

## 2020-05-05 DIAGNOSIS — I509 Heart failure, unspecified: Secondary | ICD-10-CM | POA: Insufficient documentation

## 2020-05-05 DIAGNOSIS — M25562 Pain in left knee: Secondary | ICD-10-CM | POA: Insufficient documentation

## 2020-05-05 DIAGNOSIS — Z7982 Long term (current) use of aspirin: Secondary | ICD-10-CM | POA: Insufficient documentation

## 2020-05-05 DIAGNOSIS — M25462 Effusion, left knee: Secondary | ICD-10-CM | POA: Diagnosis not present

## 2020-05-05 DIAGNOSIS — W19XXXA Unspecified fall, initial encounter: Secondary | ICD-10-CM | POA: Diagnosis not present

## 2020-05-05 DIAGNOSIS — S3992XA Unspecified injury of lower back, initial encounter: Secondary | ICD-10-CM | POA: Diagnosis not present

## 2020-05-05 DIAGNOSIS — I700 Atherosclerosis of aorta: Secondary | ICD-10-CM | POA: Diagnosis not present

## 2020-05-05 DIAGNOSIS — M85862 Other specified disorders of bone density and structure, left lower leg: Secondary | ICD-10-CM | POA: Diagnosis not present

## 2020-05-05 MED ORDER — ACETAMINOPHEN 325 MG PO TABS
650.0000 mg | ORAL_TABLET | Freq: Once | ORAL | Status: AC
  Administered 2020-05-06: 650 mg via ORAL
  Filled 2020-05-05: qty 2

## 2020-05-05 NOTE — Discharge Instructions (Signed)
It was our pleasure to provide your ER care today - we hope that you feel better.  Take acetaminophen as need.   Fall precautions.  Return to ER if worse, new symptoms, fevers, new or severe pain, weak/faint, or other concern.

## 2020-05-05 NOTE — ED Triage Notes (Signed)
Patient here from [facility] with a mechanical, unwitnessed fall, she tripped and fell.  C/O sacral and lower back pain, no LOC, no hx blood thinners, a/o x4.
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
## 2020-05-05 NOTE — ED Notes (Signed)
Pt ambulated to the restroom with walker with limited assistance from staff.

## 2020-05-10 DIAGNOSIS — Z23 Encounter for immunization: Secondary | ICD-10-CM | POA: Diagnosis not present

## 2020-05-16 DIAGNOSIS — R197 Diarrhea, unspecified: Secondary | ICD-10-CM | POA: Diagnosis not present

## 2020-05-16 DIAGNOSIS — R634 Abnormal weight loss: Secondary | ICD-10-CM | POA: Diagnosis not present

## 2020-05-19 DIAGNOSIS — I10 Essential (primary) hypertension: Secondary | ICD-10-CM | POA: Diagnosis not present

## 2020-05-19 DIAGNOSIS — M6281 Muscle weakness (generalized): Secondary | ICD-10-CM | POA: Diagnosis not present

## 2020-05-19 DIAGNOSIS — R2689 Other abnormalities of gait and mobility: Secondary | ICD-10-CM | POA: Diagnosis not present

## 2020-05-19 DIAGNOSIS — R2681 Unsteadiness on feet: Secondary | ICD-10-CM | POA: Diagnosis not present

## 2020-05-24 ENCOUNTER — Emergency Department (HOSPITAL_COMMUNITY)
Admission: EM | Admit: 2020-05-24 | Discharge: 2020-05-24 | Disposition: A | Discharge status: Home / Self Care | Payer: Medicare Other | Attending: Emergency Medicine | Admitting: Emergency Medicine

## 2020-05-24 ENCOUNTER — Encounter (HOSPITAL_COMMUNITY): Payer: Self-pay | Admitting: Emergency Medicine

## 2020-05-24 ENCOUNTER — Emergency Department (HOSPITAL_COMMUNITY): Payer: Medicare Other

## 2020-05-24 ENCOUNTER — Other Ambulatory Visit: Payer: Self-pay

## 2020-05-24 DIAGNOSIS — I110 Hypertensive heart disease with heart failure: Secondary | ICD-10-CM | POA: Diagnosis not present

## 2020-05-24 DIAGNOSIS — I2510 Atherosclerotic heart disease of native coronary artery without angina pectoris: Secondary | ICD-10-CM | POA: Diagnosis not present

## 2020-05-24 DIAGNOSIS — Z20822 Contact with and (suspected) exposure to covid-19: Secondary | ICD-10-CM | POA: Insufficient documentation

## 2020-05-24 DIAGNOSIS — Z66 Do not resuscitate: Secondary | ICD-10-CM

## 2020-05-24 DIAGNOSIS — A0472 Enterocolitis due to Clostridium difficile, not specified as recurrent: Secondary | ICD-10-CM | POA: Diagnosis not present

## 2020-05-24 DIAGNOSIS — Z7189 Other specified counseling: Secondary | ICD-10-CM | POA: Diagnosis not present

## 2020-05-24 DIAGNOSIS — M2550 Pain in unspecified joint: Secondary | ICD-10-CM | POA: Diagnosis not present

## 2020-05-24 DIAGNOSIS — Z96651 Presence of right artificial knee joint: Secondary | ICD-10-CM | POA: Insufficient documentation

## 2020-05-24 DIAGNOSIS — Z87891 Personal history of nicotine dependence: Secondary | ICD-10-CM | POA: Diagnosis not present

## 2020-05-24 DIAGNOSIS — Z789 Other specified health status: Secondary | ICD-10-CM

## 2020-05-24 DIAGNOSIS — Z96642 Presence of left artificial hip joint: Secondary | ICD-10-CM | POA: Insufficient documentation

## 2020-05-24 DIAGNOSIS — R079 Chest pain, unspecified: Secondary | ICD-10-CM | POA: Diagnosis not present

## 2020-05-24 DIAGNOSIS — Z7401 Bed confinement status: Secondary | ICD-10-CM | POA: Diagnosis not present

## 2020-05-24 DIAGNOSIS — F29 Unspecified psychosis not due to a substance or known physiological condition: Secondary | ICD-10-CM | POA: Diagnosis not present

## 2020-05-24 DIAGNOSIS — I509 Heart failure, unspecified: Secondary | ICD-10-CM | POA: Insufficient documentation

## 2020-05-24 DIAGNOSIS — Z91040 Latex allergy status: Secondary | ICD-10-CM | POA: Insufficient documentation

## 2020-05-24 DIAGNOSIS — R0902 Hypoxemia: Secondary | ICD-10-CM | POA: Diagnosis not present

## 2020-05-24 DIAGNOSIS — R531 Weakness: Secondary | ICD-10-CM | POA: Diagnosis not present

## 2020-05-24 DIAGNOSIS — I10 Essential (primary) hypertension: Secondary | ICD-10-CM | POA: Diagnosis not present

## 2020-05-24 DIAGNOSIS — Z515 Encounter for palliative care: Secondary | ICD-10-CM | POA: Diagnosis not present

## 2020-05-24 DIAGNOSIS — R0789 Other chest pain: Secondary | ICD-10-CM | POA: Diagnosis not present

## 2020-05-24 DIAGNOSIS — R0602 Shortness of breath: Secondary | ICD-10-CM | POA: Diagnosis not present

## 2020-05-24 LAB — BASIC METABOLIC PANEL
Anion gap: 12 (ref 5–15)
BUN: 11 mg/dL (ref 8–23)
CO2: 23 mmol/L (ref 22–32)
Calcium: 9.4 mg/dL (ref 8.9–10.3)
Chloride: 94 mmol/L — ABNORMAL LOW (ref 98–111)
Creatinine, Ser: 0.64 mg/dL (ref 0.44–1.00)
GFR calc non Af Amer: >60 mL/min (ref >60)
Glucose, Bld: 97 mg/dL (ref 70–99)
Potassium: 3.6 mmol/L (ref 3.5–5.1)
Sodium: 129 mmol/L — ABNORMAL LOW (ref 135–145)

## 2020-05-24 LAB — CBC
HCT: 40 % (ref 36.0–46.0)
Hemoglobin: 13.9 g/dL (ref 12.0–15.0)
MCH: 32.3 pg (ref 26.0–34.0)
MCHC: 34.8 g/dL (ref 30.0–36.0)
MCV: 93 fL (ref 80.0–100.0)
Platelets: 219 10*3/uL (ref 150–400)
RBC: 4.3 MIL/uL (ref 3.87–5.11)
RDW: 13 % (ref 11.5–15.5)
WBC: 7.1 10*3/uL (ref 4.0–10.5)
nRBC: 0 % (ref 0.0–0.2)

## 2020-05-24 LAB — TROPONIN I (HIGH SENSITIVITY)
Troponin I (High Sensitivity): 15 ng/L (ref <18)
Troponin I (High Sensitivity): 21 ng/L — ABNORMAL HIGH (ref <18)

## 2020-05-24 LAB — RESPIRATORY PANEL BY RT PCR (FLU A&B, COVID)
Influenza A by PCR: NEGATIVE
Influenza B by PCR: NEGATIVE
SARS Coronavirus 2 by RT PCR: NEGATIVE
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
## 2020-05-24 NOTE — Social Work (Signed)
TOC team is currently working with son, Rush Landmark @ [phone] in an effort to coordinate discharge plan to skilled nursing section of [facility]. [place] Team has calls into DON of [facility] as well.

## 2020-05-24 NOTE — Progress Notes (Signed)
CSW sent FL2 and therapy notes via hub to [place] [facility] vis fax to [phone] Sent Covid test results via route to fax #

## 2020-05-24 NOTE — NC FL2 (Signed)
[place] LEVEL OF CARE SCREENING TOOL     IDENTIFICATION  Patient Name: [name] Birthdate: 10/29/1921 Sex: female Admission Date (Current Location): 05/24/2020  [facility] and [place] Number:  Herbalist and Address:  [facility]. [facility], [place] [address] [place], [place] [address]      Provider Number: [id number]  Attending Physician Name and Address:  Valarie Merino, MD  Relative Name and Phone Number:  Raya, [place]   [phone]    Current Level of Care: Hospital Recommended Level of Care: [place] Prior Approval Number:    Date Approved/Denied:   PASRR Number: [id number]  Discharge Plan: SNF    Current Diagnoses: Patient Active Problem List   Diagnosis Date Noted  . History of left hip hemiarthroplasty 09/08/2019  . Depression, recurrent ([place]) 08/30/2019  . Pressure ulcer of left heel, unstageable ([place]) 08/30/2019  . Allergic rhinitis 08/30/2019  . Blood in the stool 06/29/2019  . Chronic diarrhea 05/08/2019  . Subclavian artery stenosis, left ([place]) 07/30/2018  . Atrial fibrillation with RVR ([place])   . Chronic GI bleeding 12/06/2017  . Pelvic mass in female 02/19/2017  . Gastroesophageal reflux disease without esophagitis 01/31/2016  . CAD (coronary artery disease)   . Abnormality of gait 09/07/2015  . Left carotid bruit 08/10/2015  . (HFpEF) heart failure with preserved ejection fraction ([place]) 05/08/2015  . ARF (acute renal failure) ([place]) 05/08/2015  . Anemia, iron deficiency 09/16/2014  . NSTEMI (non-ST elevated myocardial infarction) ([place]) 09/13/2014  . Memory disorder 03/05/2014  . Severe tricuspid regurgitation 12/10/2013  . Non-rheumatic mitral regurgitation 12/10/2013  . Dyspnea on exertion 11/03/2013  . Edema of both legs 11/03/2013  . Valvular heart disease 11/03/2013  . Bradycardia 09/02/2013  . Mild aortic stenosis 03/03/2013  . Moderate to severe pulmonary hypertension  ([place]) 03/03/2013  . Essential hypertension 03/03/2013  . Mixed hyperlipidemia 03/03/2013  . Polyneuropathy in other diseases classified elsewhere ([place]) 11/10/2012    Orientation RESPIRATION BLADDER Height & Weight     Self, Time, Situation, Place  Normal Incontinent Weight:   Height:     BEHAVIORAL SYMPTOMS/MOOD NEUROLOGICAL BOWEL NUTRITION STATUS      Incontinent Diet (Regular)  AMBULATORY STATUS COMMUNICATION OF NEEDS Skin   Extensive Assist Verbally Normal                       Personal Care Assistance Level of Assistance  Bathing, Feeding, Dressing Bathing Assistance: Maximum assistance Feeding assistance: Maximum assistance Dressing Assistance: Maximum assistance     Functional Limitations Info  Sight, Hearing, Speech Sight Info: Adequate Hearing Info: Adequate Speech Info: Adequate    SPECIAL CARE FACTORS FREQUENCY                       Contractures Contractures Info: Not present    Additional Factors Info  Code Status, Allergies Code Status Info: DNR Allergies Info: (11)Moxifloxacin,Codeine,Donepezil,Latex,Sulfa Drugs Cross Reactors,Lactose Intolerance,Duloxetine,Polysporin,Garlic, Onion           Current Medications (05/24/2020):  This is the current hospital active medication list No current facility-administered medications for this encounter.   Current Outpatient Medications  Medication Sig Dispense Refill  . acetaminophen (TYLENOL) 500 MG tablet Take 500 mg by mouth 4 (four) times daily.     Marland Kitchen amiodarone (PACERONE) 100 MG tablet Take 1 tablet (100 mg total) by mouth daily. 90 tablet 1  . atorvastatin (LIPITOR) 10 MG tablet Take 5  mg by mouth daily.     . calcium carbonate (OS-CAL) 1250 (500 Ca) MG chewable tablet Chew 1 tablet by mouth daily.    . cetirizine (ZYRTEC) 10 MG tablet Take 10 mg by mouth daily with breakfast.     . cholecalciferol (VITAMIN D) 1000 UNITS tablet Take 2,000 Units by mouth daily.     . colestipol (COLESTID) 1 g  tablet Take 1 g by mouth 3 (three) times daily as needed (upset stomach).     . Cyanocobalamin (B-12) 1000 MCG TABS Take 1,000 mcg by mouth daily.     . cycloSPORINE (RESTASIS) 0.05 % ophthalmic emulsion Place 1 drop into both eyes 2 (two) times daily.    . famotidine (PEPCID) 20 MG tablet Take 20 mg by mouth daily.    . ferrous sulfate 325 (65 FE) MG tablet Take 325 mg by mouth daily.    . fluticasone (FLONASE) 50 MCG/ACT nasal spray Place 1 spray into both nostrils daily.     Marland Kitchen gabapentin (NEURONTIN) 300 MG capsule TAKE 1 CAPSULE THREE TIMES DAILY AND 3 CAPSULES AT BEDTIME (Patient taking differently: Take 300-900 mg by mouth See admin instructions. Take 300mg  by mouth three times daily and 900mg  at bedtime.) 180 capsule 0  . levETIRAcetam (KEPPRA) 250 MG tablet TAKE 1/2 TABLET IN THE MORNING  AND TAKE 2 TABLETS IN THE EVENING (Patient taking differently: Take 125-500 mg by mouth See admin instructions. Take 125mg  by mouth in the morning and take 500mg  in the evening.) 225 tablet 1  . loperamide (IMODIUM) 2 MG capsule Take 2 mg by mouth daily.     . memantine (NAMENDA) 10 MG tablet Take 1 tablet (10 mg total) by mouth 2 (two) times daily. 180 tablet 0  . mirtazapine (REMERON) 7.5 MG tablet Take 7.5 mg by mouth at bedtime.    . Multiple Vitamins-Minerals (MULTIVITAMIN WITH MINERALS) tablet Take 1 tablet by mouth daily.    . pantoprazole (PROTONIX) 40 MG tablet Take 40 mg by mouth at bedtime.     . ondansetron (ZOFRAN) 4 MG tablet Take 1 tablet (4 mg total) by mouth every 8 (eight) hours as needed for up to 15 doses for nausea or vomiting. (Patient not taking: Reported on 05/24/2020) 12 tablet 0     Discharge Medications: Please see discharge summary for a list of discharge medications.  Relevant Imaging Results:  Relevant Lab Results:   Additional Information SS# [id number]  Vergie Living, LCSW
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
## 2020-05-24 NOTE — ED Triage Notes (Addendum)
Patient arrives to ED from [facility] via [place] with complaints of left sided chest pain and shortness of breath that started this morning. Was recently treated for C-diff. Pt states "I don't want to be here but my chest hurts." Pt also states "I was supposed to meet my maker today, but they would not let me."

## 2020-05-24 NOTE — Care Management (Signed)
ED RNCM confirmed with patient and  son desire for hospice services. With [facility]. CM called in referral to [place] with [facility], also faxed in the referral as well. Updated EDP and as per son patient with be returning to her apartment where family provide close supervision until patient obtains a Skilled bed at the facility.

## 2020-05-24 NOTE — ED Notes (Signed)
PTAR at bedside at this time.  

## 2020-05-24 NOTE — Discharge Instructions (Addendum)
Please return for any problem.  °

## 2020-05-26 ENCOUNTER — Non-Acute Institutional Stay (SKILLED_NURSING_FACILITY): Payer: Medicare Other | Admitting: Internal Medicine

## 2020-05-26 ENCOUNTER — Encounter: Payer: Self-pay | Admitting: Internal Medicine

## 2020-05-26 ENCOUNTER — Telehealth: Payer: Self-pay

## 2020-05-26 DIAGNOSIS — Z7189 Other specified counseling: Secondary | ICD-10-CM | POA: Insufficient documentation

## 2020-05-26 DIAGNOSIS — I4891 Unspecified atrial fibrillation: Secondary | ICD-10-CM

## 2020-05-26 DIAGNOSIS — F339 Major depressive disorder, recurrent, unspecified: Secondary | ICD-10-CM | POA: Diagnosis not present

## 2020-05-26 DIAGNOSIS — Z515 Encounter for palliative care: Secondary | ICD-10-CM | POA: Insufficient documentation

## 2020-05-26 DIAGNOSIS — A0472 Enterocolitis due to Clostridium difficile, not specified as recurrent: Secondary | ICD-10-CM | POA: Insufficient documentation

## 2020-05-26 DIAGNOSIS — R531 Weakness: Secondary | ICD-10-CM | POA: Insufficient documentation

## 2020-05-26 DIAGNOSIS — D500 Iron deficiency anemia secondary to blood loss (chronic): Secondary | ICD-10-CM

## 2020-05-26 DIAGNOSIS — G63 Polyneuropathy in diseases classified elsewhere: Secondary | ICD-10-CM

## 2020-05-26 DIAGNOSIS — K529 Noninfective gastroenteritis and colitis, unspecified: Secondary | ICD-10-CM | POA: Diagnosis not present

## 2020-05-26 DIAGNOSIS — R413 Other amnesia: Secondary | ICD-10-CM

## 2020-05-26 DIAGNOSIS — Z789 Other specified health status: Secondary | ICD-10-CM | POA: Insufficient documentation

## 2020-05-26 DIAGNOSIS — E782 Mixed hyperlipidemia: Secondary | ICD-10-CM

## 2020-05-26 DIAGNOSIS — R509 Fever, unspecified: Secondary | ICD-10-CM | POA: Diagnosis not present

## 2020-05-26 DIAGNOSIS — R410 Disorientation, unspecified: Secondary | ICD-10-CM | POA: Diagnosis not present

## 2020-05-26 DIAGNOSIS — Z66 Do not resuscitate: Secondary | ICD-10-CM | POA: Insufficient documentation

## 2020-05-26 NOTE — Telephone Encounter (Signed)
Geri from Hospice called to ask for an order for hospice for patient said they need the order right away. Please Advise

## 2020-05-26 NOTE — Telephone Encounter (Signed)
Done
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
## 2020-06-02 ENCOUNTER — Encounter: Payer: Self-pay | Admitting: Internal Medicine

## 2020-06-02 ENCOUNTER — Non-Acute Institutional Stay (SKILLED_NURSING_FACILITY): Payer: Medicare Other | Admitting: Internal Medicine

## 2020-06-02 DIAGNOSIS — I4891 Unspecified atrial fibrillation: Secondary | ICD-10-CM

## 2020-06-02 DIAGNOSIS — E782 Mixed hyperlipidemia: Secondary | ICD-10-CM

## 2020-06-02 DIAGNOSIS — D500 Iron deficiency anemia secondary to blood loss (chronic): Secondary | ICD-10-CM | POA: Diagnosis not present

## 2020-06-02 DIAGNOSIS — K219 Gastro-esophageal reflux disease without esophagitis: Secondary | ICD-10-CM

## 2020-06-02 DIAGNOSIS — K922 Gastrointestinal hemorrhage, unspecified: Secondary | ICD-10-CM

## 2020-06-02 DIAGNOSIS — R6889 Other general symptoms and signs: Secondary | ICD-10-CM | POA: Diagnosis not present

## 2020-06-02 DIAGNOSIS — R413 Other amnesia: Secondary | ICD-10-CM

## 2020-06-02 DIAGNOSIS — F339 Major depressive disorder, recurrent, unspecified: Secondary | ICD-10-CM

## 2020-06-02 DIAGNOSIS — R7989 Other specified abnormal findings of blood chemistry: Secondary | ICD-10-CM | POA: Diagnosis not present

## 2020-06-02 DIAGNOSIS — K529 Noninfective gastroenteritis and colitis, unspecified: Secondary | ICD-10-CM | POA: Diagnosis not present

## 2020-06-02 LAB — COMPREHENSIVE METABOLIC PANEL
Albumin: 3.7 (ref 3.5–5.0)
Calcium: 9.3 (ref 8.7–10.7)
Globulin: 2.7

## 2020-06-02 LAB — BASIC METABOLIC PANEL
BUN: 12 (ref 4–21)
CO2: 31 — AB (ref 13–22)
Chloride: 99 (ref 99–108)
Creatinine: 0.7 (ref 0.5–1.1)
Glucose: 74
Potassium: 3.9 (ref 3.4–5.3)
Sodium: 137 (ref 137–147)

## 2020-06-02 LAB — CBC AND DIFFERENTIAL
HCT: 40 (ref 36–46)
Hemoglobin: 13.9 (ref 12.0–16.0)
Platelets: 187 (ref 150–399)
WBC: 5.6

## 2020-06-02 LAB — CBC: RBC: 4.26 (ref 3.87–5.11)

## 2020-06-02 LAB — HEPATIC FUNCTION PANEL
ALT: 10 (ref 7–35)
AST: 17 (ref 13–35)
Alkaline Phosphatase: 51 (ref 25–125)
Bilirubin, Total: 0.6
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
## 2020-06-03 DIAGNOSIS — A0472 Enterocolitis due to Clostridium difficile, not specified as recurrent: Secondary | ICD-10-CM | POA: Diagnosis not present

## 2020-06-09 DIAGNOSIS — Z85828 Personal history of other malignant neoplasm of skin: Secondary | ICD-10-CM | POA: Diagnosis not present

## 2020-06-09 DIAGNOSIS — E2740 Unspecified adrenocortical insufficiency: Secondary | ICD-10-CM | POA: Diagnosis not present

## 2020-06-09 DIAGNOSIS — M5459 Other low back pain: Secondary | ICD-10-CM | POA: Diagnosis not present

## 2020-06-09 DIAGNOSIS — G629 Polyneuropathy, unspecified: Secondary | ICD-10-CM | POA: Diagnosis not present

## 2020-06-09 DIAGNOSIS — E782 Mixed hyperlipidemia: Secondary | ICD-10-CM | POA: Diagnosis not present

## 2020-06-09 DIAGNOSIS — Z7901 Long term (current) use of anticoagulants: Secondary | ICD-10-CM | POA: Diagnosis not present

## 2020-06-09 DIAGNOSIS — E785 Hyperlipidemia, unspecified: Secondary | ICD-10-CM | POA: Diagnosis not present

## 2020-06-09 DIAGNOSIS — R1311 Dysphagia, oral phase: Secondary | ICD-10-CM | POA: Diagnosis not present

## 2020-06-09 DIAGNOSIS — Z96651 Presence of right artificial knee joint: Secondary | ICD-10-CM | POA: Diagnosis not present

## 2020-06-09 DIAGNOSIS — Z9181 History of falling: Secondary | ICD-10-CM | POA: Diagnosis not present

## 2020-06-09 DIAGNOSIS — I480 Paroxysmal atrial fibrillation: Secondary | ICD-10-CM | POA: Diagnosis not present

## 2020-06-09 DIAGNOSIS — R2681 Unsteadiness on feet: Secondary | ICD-10-CM | POA: Diagnosis not present

## 2020-06-09 DIAGNOSIS — M1990 Unspecified osteoarthritis, unspecified site: Secondary | ICD-10-CM | POA: Diagnosis not present

## 2020-06-09 DIAGNOSIS — M6281 Muscle weakness (generalized): Secondary | ICD-10-CM | POA: Diagnosis not present

## 2020-06-09 DIAGNOSIS — I5032 Chronic diastolic (congestive) heart failure: Secondary | ICD-10-CM | POA: Diagnosis not present

## 2020-06-09 DIAGNOSIS — I252 Old myocardial infarction: Secondary | ICD-10-CM | POA: Diagnosis not present

## 2020-06-09 DIAGNOSIS — I080 Rheumatic disorders of both mitral and aortic valves: Secondary | ICD-10-CM | POA: Diagnosis not present

## 2020-06-09 DIAGNOSIS — G9009 Other idiopathic peripheral autonomic neuropathy: Secondary | ICD-10-CM | POA: Diagnosis not present

## 2020-06-09 DIAGNOSIS — R0789 Other chest pain: Secondary | ICD-10-CM | POA: Diagnosis not present

## 2020-06-09 DIAGNOSIS — R2689 Other abnormalities of gait and mobility: Secondary | ICD-10-CM | POA: Diagnosis not present

## 2020-06-09 DIAGNOSIS — I341 Nonrheumatic mitral (valve) prolapse: Secondary | ICD-10-CM | POA: Diagnosis not present

## 2020-06-10 DIAGNOSIS — M6281 Muscle weakness (generalized): Secondary | ICD-10-CM | POA: Diagnosis not present

## 2020-06-10 DIAGNOSIS — I480 Paroxysmal atrial fibrillation: Secondary | ICD-10-CM | POA: Diagnosis not present

## 2020-06-10 DIAGNOSIS — Z9181 History of falling: Secondary | ICD-10-CM | POA: Diagnosis not present

## 2020-06-10 DIAGNOSIS — R0789 Other chest pain: Secondary | ICD-10-CM | POA: Diagnosis not present

## 2020-06-10 DIAGNOSIS — R2681 Unsteadiness on feet: Secondary | ICD-10-CM | POA: Diagnosis not present

## 2020-06-10 DIAGNOSIS — M5459 Other low back pain: Secondary | ICD-10-CM | POA: Diagnosis not present

## 2020-06-13 DIAGNOSIS — Z9181 History of falling: Secondary | ICD-10-CM | POA: Diagnosis not present

## 2020-06-13 DIAGNOSIS — R2681 Unsteadiness on feet: Secondary | ICD-10-CM | POA: Diagnosis not present

## 2020-06-13 DIAGNOSIS — M5459 Other low back pain: Secondary | ICD-10-CM | POA: Diagnosis not present

## 2020-06-13 DIAGNOSIS — I480 Paroxysmal atrial fibrillation: Secondary | ICD-10-CM | POA: Diagnosis not present

## 2020-06-13 DIAGNOSIS — R0789 Other chest pain: Secondary | ICD-10-CM | POA: Diagnosis not present

## 2020-06-13 DIAGNOSIS — M6281 Muscle weakness (generalized): Secondary | ICD-10-CM | POA: Diagnosis not present

## 2020-06-14 DIAGNOSIS — Z9181 History of falling: Secondary | ICD-10-CM | POA: Diagnosis not present

## 2020-06-14 DIAGNOSIS — R0789 Other chest pain: Secondary | ICD-10-CM | POA: Diagnosis not present

## 2020-06-14 DIAGNOSIS — M6281 Muscle weakness (generalized): Secondary | ICD-10-CM | POA: Diagnosis not present

## 2020-06-14 DIAGNOSIS — M5459 Other low back pain: Secondary | ICD-10-CM | POA: Diagnosis not present

## 2020-06-14 DIAGNOSIS — I480 Paroxysmal atrial fibrillation: Secondary | ICD-10-CM | POA: Diagnosis not present

## 2020-06-14 DIAGNOSIS — R2681 Unsteadiness on feet: Secondary | ICD-10-CM | POA: Diagnosis not present

## 2020-06-15 ENCOUNTER — Encounter: Payer: Self-pay | Admitting: Internal Medicine

## 2020-06-15 ENCOUNTER — Non-Acute Institutional Stay (SKILLED_NURSING_FACILITY): Payer: Medicare Other | Admitting: Internal Medicine

## 2020-06-15 DIAGNOSIS — H04123 Dry eye syndrome of bilateral lacrimal glands: Secondary | ICD-10-CM | POA: Diagnosis not present

## 2020-06-15 DIAGNOSIS — I4891 Unspecified atrial fibrillation: Secondary | ICD-10-CM | POA: Diagnosis not present

## 2020-06-15 DIAGNOSIS — M6281 Muscle weakness (generalized): Secondary | ICD-10-CM | POA: Diagnosis not present

## 2020-06-15 DIAGNOSIS — I480 Paroxysmal atrial fibrillation: Secondary | ICD-10-CM | POA: Diagnosis not present

## 2020-06-15 DIAGNOSIS — Z9181 History of falling: Secondary | ICD-10-CM | POA: Diagnosis not present

## 2020-06-15 DIAGNOSIS — M5459 Other low back pain: Secondary | ICD-10-CM | POA: Diagnosis not present

## 2020-06-15 DIAGNOSIS — D500 Iron deficiency anemia secondary to blood loss (chronic): Secondary | ICD-10-CM | POA: Diagnosis not present

## 2020-06-15 DIAGNOSIS — K529 Noninfective gastroenteritis and colitis, unspecified: Secondary | ICD-10-CM | POA: Diagnosis not present

## 2020-06-15 DIAGNOSIS — R2681 Unsteadiness on feet: Secondary | ICD-10-CM | POA: Diagnosis not present

## 2020-06-15 DIAGNOSIS — F339 Major depressive disorder, recurrent, unspecified: Secondary | ICD-10-CM

## 2020-06-15 DIAGNOSIS — R0789 Other chest pain: Secondary | ICD-10-CM | POA: Diagnosis not present
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
## 2020-06-16 DIAGNOSIS — R2681 Unsteadiness on feet: Secondary | ICD-10-CM | POA: Diagnosis not present

## 2020-06-16 DIAGNOSIS — M6281 Muscle weakness (generalized): Secondary | ICD-10-CM | POA: Diagnosis not present

## 2020-06-16 DIAGNOSIS — M5459 Other low back pain: Secondary | ICD-10-CM | POA: Diagnosis not present

## 2020-06-16 DIAGNOSIS — I480 Paroxysmal atrial fibrillation: Secondary | ICD-10-CM | POA: Diagnosis not present

## 2020-06-16 DIAGNOSIS — Z9181 History of falling: Secondary | ICD-10-CM | POA: Diagnosis not present

## 2020-06-16 DIAGNOSIS — R0789 Other chest pain: Secondary | ICD-10-CM | POA: Diagnosis not present

## 2020-06-17 DIAGNOSIS — R0789 Other chest pain: Secondary | ICD-10-CM | POA: Diagnosis not present

## 2020-06-17 DIAGNOSIS — I480 Paroxysmal atrial fibrillation: Secondary | ICD-10-CM | POA: Diagnosis not present

## 2020-06-17 DIAGNOSIS — R2681 Unsteadiness on feet: Secondary | ICD-10-CM | POA: Diagnosis not present

## 2020-06-17 DIAGNOSIS — Z9181 History of falling: Secondary | ICD-10-CM | POA: Diagnosis not present

## 2020-06-17 DIAGNOSIS — M6281 Muscle weakness (generalized): Secondary | ICD-10-CM | POA: Diagnosis not present

## 2020-06-17 DIAGNOSIS — M5459 Other low back pain: Secondary | ICD-10-CM | POA: Diagnosis not present

## 2020-06-20 DIAGNOSIS — R2681 Unsteadiness on feet: Secondary | ICD-10-CM | POA: Diagnosis not present

## 2020-06-20 DIAGNOSIS — Z96651 Presence of right artificial knee joint: Secondary | ICD-10-CM | POA: Diagnosis not present

## 2020-06-20 DIAGNOSIS — E785 Hyperlipidemia, unspecified: Secondary | ICD-10-CM | POA: Diagnosis not present

## 2020-06-20 DIAGNOSIS — E782 Mixed hyperlipidemia: Secondary | ICD-10-CM | POA: Diagnosis not present

## 2020-06-20 DIAGNOSIS — R2689 Other abnormalities of gait and mobility: Secondary | ICD-10-CM | POA: Diagnosis not present

## 2020-06-20 DIAGNOSIS — R41841 Cognitive communication deficit: Secondary | ICD-10-CM | POA: Diagnosis not present

## 2020-06-20 DIAGNOSIS — M6281 Muscle weakness (generalized): Secondary | ICD-10-CM | POA: Diagnosis not present

## 2020-06-20 DIAGNOSIS — M1990 Unspecified osteoarthritis, unspecified site: Secondary | ICD-10-CM | POA: Diagnosis not present

## 2020-06-20 DIAGNOSIS — I480 Paroxysmal atrial fibrillation: Secondary | ICD-10-CM | POA: Diagnosis not present

## 2020-06-20 DIAGNOSIS — I252 Old myocardial infarction: Secondary | ICD-10-CM | POA: Diagnosis not present

## 2020-06-20 DIAGNOSIS — G9009 Other idiopathic peripheral autonomic neuropathy: Secondary | ICD-10-CM | POA: Diagnosis not present

## 2020-06-20 DIAGNOSIS — M5459 Other low back pain: Secondary | ICD-10-CM | POA: Diagnosis not present

## 2020-06-20 DIAGNOSIS — Z9181 History of falling: Secondary | ICD-10-CM | POA: Diagnosis not present

## 2020-06-20 DIAGNOSIS — E2740 Unspecified adrenocortical insufficiency: Secondary | ICD-10-CM | POA: Diagnosis not present

## 2020-06-20 DIAGNOSIS — I341 Nonrheumatic mitral (valve) prolapse: Secondary | ICD-10-CM | POA: Diagnosis not present

## 2020-06-20 DIAGNOSIS — R1311 Dysphagia, oral phase: Secondary | ICD-10-CM | POA: Diagnosis not present

## 2020-06-20 DIAGNOSIS — I5032 Chronic diastolic (congestive) heart failure: Secondary | ICD-10-CM | POA: Diagnosis not present

## 2020-06-20 DIAGNOSIS — Z85828 Personal history of other malignant neoplasm of skin: Secondary | ICD-10-CM | POA: Diagnosis not present

## 2020-06-20 DIAGNOSIS — Z7901 Long term (current) use of anticoagulants: Secondary | ICD-10-CM | POA: Diagnosis not present

## 2020-06-20 DIAGNOSIS — R0789 Other chest pain: Secondary | ICD-10-CM | POA: Diagnosis not present

## 2020-06-20 DIAGNOSIS — G629 Polyneuropathy, unspecified: Secondary | ICD-10-CM | POA: Diagnosis not present

## 2020-06-21 DIAGNOSIS — M5459 Other low back pain: Secondary | ICD-10-CM | POA: Diagnosis not present

## 2020-06-21 DIAGNOSIS — R2681 Unsteadiness on feet: Secondary | ICD-10-CM | POA: Diagnosis not present

## 2020-06-21 DIAGNOSIS — I480 Paroxysmal atrial fibrillation: Secondary | ICD-10-CM | POA: Diagnosis not present

## 2020-06-21 DIAGNOSIS — R0789 Other chest pain: Secondary | ICD-10-CM | POA: Diagnosis not present

## 2020-06-21 DIAGNOSIS — M6281 Muscle weakness (generalized): Secondary | ICD-10-CM | POA: Diagnosis not present

## 2020-06-21 DIAGNOSIS — Z9181 History of falling: Secondary | ICD-10-CM | POA: Diagnosis not present

## 2020-06-22 ENCOUNTER — Non-Acute Institutional Stay (SKILLED_NURSING_FACILITY): Payer: Medicare Other | Admitting: Internal Medicine

## 2020-06-22 ENCOUNTER — Encounter: Payer: Self-pay | Admitting: Internal Medicine

## 2020-06-22 DIAGNOSIS — I38 Endocarditis, valve unspecified: Secondary | ICD-10-CM

## 2020-06-22 DIAGNOSIS — M5459 Other low back pain: Secondary | ICD-10-CM | POA: Diagnosis not present

## 2020-06-22 DIAGNOSIS — R0789 Other chest pain: Secondary | ICD-10-CM | POA: Diagnosis not present

## 2020-06-22 DIAGNOSIS — Z9181 History of falling: Secondary | ICD-10-CM | POA: Diagnosis not present

## 2020-06-22 DIAGNOSIS — R058 Other specified cough: Secondary | ICD-10-CM | POA: Diagnosis not present

## 2020-06-22 DIAGNOSIS — I480 Paroxysmal atrial fibrillation: Secondary | ICD-10-CM | POA: Diagnosis not present

## 2020-06-22 DIAGNOSIS — G63 Polyneuropathy in diseases classified elsewhere: Secondary | ICD-10-CM | POA: Diagnosis not present

## 2020-06-22 DIAGNOSIS — R2681 Unsteadiness on feet: Secondary | ICD-10-CM | POA: Diagnosis not present

## 2020-06-22 DIAGNOSIS — L89620 Pressure ulcer of left heel, unstageable: Secondary | ICD-10-CM

## 2020-06-22 DIAGNOSIS — M6281 Muscle weakness (generalized): Secondary | ICD-10-CM | POA: Diagnosis not present
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
## 2020-06-23 DIAGNOSIS — R059 Cough, unspecified: Secondary | ICD-10-CM | POA: Diagnosis not present

## 2020-06-24 ENCOUNTER — Encounter: Payer: Self-pay | Admitting: Nurse Practitioner

## 2020-06-24 ENCOUNTER — Non-Acute Institutional Stay (SKILLED_NURSING_FACILITY): Payer: Medicare Other | Admitting: Nurse Practitioner

## 2020-06-24 ENCOUNTER — Encounter: Payer: Self-pay | Admitting: Internal Medicine

## 2020-06-24 DIAGNOSIS — D500 Iron deficiency anemia secondary to blood loss (chronic): Secondary | ICD-10-CM

## 2020-06-24 DIAGNOSIS — R627 Adult failure to thrive: Secondary | ICD-10-CM | POA: Diagnosis not present

## 2020-06-24 DIAGNOSIS — I480 Paroxysmal atrial fibrillation: Secondary | ICD-10-CM | POA: Diagnosis not present

## 2020-06-24 DIAGNOSIS — R413 Other amnesia: Secondary | ICD-10-CM

## 2020-06-24 DIAGNOSIS — F339 Major depressive disorder, recurrent, unspecified: Secondary | ICD-10-CM

## 2020-06-24 DIAGNOSIS — R0789 Other chest pain: Secondary | ICD-10-CM | POA: Diagnosis not present

## 2020-06-24 DIAGNOSIS — G63 Polyneuropathy in diseases classified elsewhere: Secondary | ICD-10-CM

## 2020-06-24 DIAGNOSIS — I4891 Unspecified atrial fibrillation: Secondary | ICD-10-CM | POA: Diagnosis not present

## 2020-06-24 DIAGNOSIS — M6281 Muscle weakness (generalized): Secondary | ICD-10-CM | POA: Diagnosis not present

## 2020-06-24 DIAGNOSIS — R350 Frequency of micturition: Secondary | ICD-10-CM

## 2020-06-24 DIAGNOSIS — E782 Mixed hyperlipidemia: Secondary | ICD-10-CM

## 2020-06-24 DIAGNOSIS — R2681 Unsteadiness on feet: Secondary | ICD-10-CM | POA: Diagnosis not present

## 2020-06-24 DIAGNOSIS — K219 Gastro-esophageal reflux disease without esophagitis: Secondary | ICD-10-CM

## 2020-06-24 DIAGNOSIS — M5459 Other low back pain: Secondary | ICD-10-CM | POA: Diagnosis not present

## 2020-06-24 DIAGNOSIS — Z9181 History of falling: Secondary | ICD-10-CM | POA: Diagnosis not present

## 2020-06-24 DIAGNOSIS — K529 Noninfective gastroenteritis and colitis, unspecified: Secondary | ICD-10-CM
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
## 2020-06-27 ENCOUNTER — Encounter: Payer: Self-pay | Admitting: Nurse Practitioner

## 2020-06-27 DIAGNOSIS — M6281 Muscle weakness (generalized): Secondary | ICD-10-CM | POA: Diagnosis not present

## 2020-06-27 DIAGNOSIS — R627 Adult failure to thrive: Secondary | ICD-10-CM | POA: Insufficient documentation

## 2020-06-27 DIAGNOSIS — I480 Paroxysmal atrial fibrillation: Secondary | ICD-10-CM | POA: Diagnosis not present

## 2020-06-27 DIAGNOSIS — Z9181 History of falling: Secondary | ICD-10-CM | POA: Diagnosis not present

## 2020-06-27 DIAGNOSIS — R2681 Unsteadiness on feet: Secondary | ICD-10-CM | POA: Diagnosis not present

## 2020-06-27 DIAGNOSIS — R0789 Other chest pain: Secondary | ICD-10-CM | POA: Diagnosis not present

## 2020-06-27 DIAGNOSIS — M5459 Other low back pain: Secondary | ICD-10-CM | POA: Diagnosis not present

## 2020-06-27 DIAGNOSIS — R350 Frequency of micturition: Secondary | ICD-10-CM | POA: Insufficient documentation

## 2020-06-27 NOTE — Assessment & Plan Note (Signed)
Depression, stable, takes Mirtazapine.

## 2020-06-27 NOTE — Assessment & Plan Note (Signed)
Hyperlipidemia LDL 66 in the past.

## 2020-06-27 NOTE — Assessment & Plan Note (Signed)
GERD/Hx of GI bleed, takes Pantoprazole, Famotdine.  

## 2020-06-27 NOTE — Assessment & Plan Note (Signed)
Chronic diarrhea, stable presently, recent C-diff, fully treated

## 2020-06-27 NOTE — Assessment & Plan Note (Signed)
Continue supportive care in SNF [facility]

## 2020-06-27 NOTE — Assessment & Plan Note (Signed)
Memory deficict, off Mamentine.

## 2020-06-27 NOTE — Assessment & Plan Note (Signed)
Peripheral neuropathy, takes Gabapentin, Keppra, Tylenol. 

## 2020-06-27 NOTE — Assessment & Plan Note (Signed)
Urinary frequency, chronic. 

## 2020-06-27 NOTE — Assessment & Plan Note (Signed)
IDA Hgb Hgb 13.9 06/02/20, off Iron

## 2020-06-28 DIAGNOSIS — R2681 Unsteadiness on feet: Secondary | ICD-10-CM | POA: Diagnosis not present

## 2020-06-28 DIAGNOSIS — Z9181 History of falling: Secondary | ICD-10-CM | POA: Diagnosis not present

## 2020-06-28 DIAGNOSIS — I480 Paroxysmal atrial fibrillation: Secondary | ICD-10-CM | POA: Diagnosis not present

## 2020-06-28 DIAGNOSIS — M6281 Muscle weakness (generalized): Secondary | ICD-10-CM | POA: Diagnosis not present

## 2020-06-28 DIAGNOSIS — R0789 Other chest pain: Secondary | ICD-10-CM | POA: Diagnosis not present

## 2020-06-28 DIAGNOSIS — M5459 Other low back pain: Secondary | ICD-10-CM | POA: Diagnosis not present

## 2020-06-29 DIAGNOSIS — R0789 Other chest pain: Secondary | ICD-10-CM | POA: Diagnosis not present

## 2020-06-29 DIAGNOSIS — M5459 Other low back pain: Secondary | ICD-10-CM | POA: Diagnosis not present

## 2020-06-29 DIAGNOSIS — Z9181 History of falling: Secondary | ICD-10-CM | POA: Diagnosis not present

## 2020-06-29 DIAGNOSIS — M6281 Muscle weakness (generalized): Secondary | ICD-10-CM | POA: Diagnosis not present

## 2020-06-29 DIAGNOSIS — I480 Paroxysmal atrial fibrillation: Secondary | ICD-10-CM | POA: Diagnosis not present

## 2020-06-29 DIAGNOSIS — R2681 Unsteadiness on feet: Secondary | ICD-10-CM | POA: Diagnosis not present

## 2020-06-30 DIAGNOSIS — Z9181 History of falling: Secondary | ICD-10-CM | POA: Diagnosis not present

## 2020-06-30 DIAGNOSIS — R0789 Other chest pain: Secondary | ICD-10-CM | POA: Diagnosis not present

## 2020-06-30 DIAGNOSIS — M6281 Muscle weakness (generalized): Secondary | ICD-10-CM | POA: Diagnosis not present

## 2020-06-30 DIAGNOSIS — M5459 Other low back pain: Secondary | ICD-10-CM | POA: Diagnosis not present

## 2020-06-30 DIAGNOSIS — R2681 Unsteadiness on feet: Secondary | ICD-10-CM | POA: Diagnosis not present

## 2020-06-30 DIAGNOSIS — I480 Paroxysmal atrial fibrillation: Secondary | ICD-10-CM | POA: Diagnosis not present

## 2020-07-01 DIAGNOSIS — Z9181 History of falling: Secondary | ICD-10-CM | POA: Diagnosis not present

## 2020-07-01 DIAGNOSIS — M6281 Muscle weakness (generalized): Secondary | ICD-10-CM | POA: Diagnosis not present

## 2020-07-01 DIAGNOSIS — R2681 Unsteadiness on feet: Secondary | ICD-10-CM | POA: Diagnosis not present

## 2020-07-01 DIAGNOSIS — I480 Paroxysmal atrial fibrillation: Secondary | ICD-10-CM | POA: Diagnosis not present

## 2020-07-01 DIAGNOSIS — R0789 Other chest pain: Secondary | ICD-10-CM | POA: Diagnosis not present

## 2020-07-01 DIAGNOSIS — M5459 Other low back pain: Secondary | ICD-10-CM | POA: Diagnosis not present

## 2020-07-04 DIAGNOSIS — Z9181 History of falling: Secondary | ICD-10-CM | POA: Diagnosis not present

## 2020-07-04 DIAGNOSIS — I480 Paroxysmal atrial fibrillation: Secondary | ICD-10-CM | POA: Diagnosis not present

## 2020-07-04 DIAGNOSIS — R0789 Other chest pain: Secondary | ICD-10-CM | POA: Diagnosis not present

## 2020-07-04 DIAGNOSIS — R2681 Unsteadiness on feet: Secondary | ICD-10-CM | POA: Diagnosis not present

## 2020-07-04 DIAGNOSIS — Z23 Encounter for immunization: Secondary | ICD-10-CM | POA: Diagnosis not present

## 2020-07-04 DIAGNOSIS — M6281 Muscle weakness (generalized): Secondary | ICD-10-CM | POA: Diagnosis not present

## 2020-07-04 DIAGNOSIS — M5459 Other low back pain: Secondary | ICD-10-CM | POA: Diagnosis not present

## 2020-07-05 ENCOUNTER — Ambulatory Visit: Payer: Medicare Other | Admitting: Neurology

## 2020-07-05 DIAGNOSIS — M5459 Other low back pain: Secondary | ICD-10-CM | POA: Diagnosis not present

## 2020-07-05 DIAGNOSIS — R0789 Other chest pain: Secondary | ICD-10-CM | POA: Diagnosis not present

## 2020-07-05 DIAGNOSIS — Z9181 History of falling: Secondary | ICD-10-CM | POA: Diagnosis not present

## 2020-07-05 DIAGNOSIS — R2681 Unsteadiness on feet: Secondary | ICD-10-CM | POA: Diagnosis not present

## 2020-07-05 DIAGNOSIS — M6281 Muscle weakness (generalized): Secondary | ICD-10-CM | POA: Diagnosis not present

## 2020-07-05 DIAGNOSIS — I480 Paroxysmal atrial fibrillation: Secondary | ICD-10-CM | POA: Diagnosis not present

## 2020-07-06 DIAGNOSIS — R2681 Unsteadiness on feet: Secondary | ICD-10-CM | POA: Diagnosis not present

## 2020-07-06 DIAGNOSIS — R0789 Other chest pain: Secondary | ICD-10-CM | POA: Diagnosis not present

## 2020-07-06 DIAGNOSIS — M5459 Other low back pain: Secondary | ICD-10-CM | POA: Diagnosis not present

## 2020-07-06 DIAGNOSIS — I480 Paroxysmal atrial fibrillation: Secondary | ICD-10-CM | POA: Diagnosis not present

## 2020-07-06 DIAGNOSIS — M6281 Muscle weakness (generalized): Secondary | ICD-10-CM | POA: Diagnosis not present

## 2020-07-06 DIAGNOSIS — Z9181 History of falling: Secondary | ICD-10-CM | POA: Diagnosis not present

## 2020-07-07 DIAGNOSIS — M6281 Muscle weakness (generalized): Secondary | ICD-10-CM | POA: Diagnosis not present

## 2020-07-07 DIAGNOSIS — R2681 Unsteadiness on feet: Secondary | ICD-10-CM | POA: Diagnosis not present

## 2020-07-07 DIAGNOSIS — Z9181 History of falling: Secondary | ICD-10-CM | POA: Diagnosis not present

## 2020-07-07 DIAGNOSIS — M5459 Other low back pain: Secondary | ICD-10-CM | POA: Diagnosis not present

## 2020-07-07 DIAGNOSIS — I480 Paroxysmal atrial fibrillation: Secondary | ICD-10-CM | POA: Diagnosis not present

## 2020-07-07 DIAGNOSIS — R0789 Other chest pain: Secondary | ICD-10-CM | POA: Diagnosis not present

## 2020-07-08 DIAGNOSIS — Z9181 History of falling: Secondary | ICD-10-CM | POA: Diagnosis not present

## 2020-07-08 DIAGNOSIS — M5459 Other low back pain: Secondary | ICD-10-CM | POA: Diagnosis not present

## 2020-07-08 DIAGNOSIS — M6281 Muscle weakness (generalized): Secondary | ICD-10-CM | POA: Diagnosis not present

## 2020-07-08 DIAGNOSIS — R0789 Other chest pain: Secondary | ICD-10-CM | POA: Diagnosis not present

## 2020-07-08 DIAGNOSIS — I480 Paroxysmal atrial fibrillation: Secondary | ICD-10-CM | POA: Diagnosis not present

## 2020-07-08 DIAGNOSIS — R2681 Unsteadiness on feet: Secondary | ICD-10-CM | POA: Diagnosis not present

## 2020-07-11 DIAGNOSIS — M6281 Muscle weakness (generalized): Secondary | ICD-10-CM | POA: Diagnosis not present

## 2020-07-11 DIAGNOSIS — R0789 Other chest pain: Secondary | ICD-10-CM | POA: Diagnosis not present

## 2020-07-11 DIAGNOSIS — R2681 Unsteadiness on feet: Secondary | ICD-10-CM | POA: Diagnosis not present

## 2020-07-11 DIAGNOSIS — I480 Paroxysmal atrial fibrillation: Secondary | ICD-10-CM | POA: Diagnosis not present

## 2020-07-11 DIAGNOSIS — Z9181 History of falling: Secondary | ICD-10-CM | POA: Diagnosis not present

## 2020-07-11 DIAGNOSIS — M5459 Other low back pain: Secondary | ICD-10-CM | POA: Diagnosis not present

## 2020-07-12 DIAGNOSIS — Z9181 History of falling: Secondary | ICD-10-CM | POA: Diagnosis not present

## 2020-07-12 DIAGNOSIS — I480 Paroxysmal atrial fibrillation: Secondary | ICD-10-CM | POA: Diagnosis not present

## 2020-07-12 DIAGNOSIS — M5459 Other low back pain: Secondary | ICD-10-CM | POA: Diagnosis not present

## 2020-07-12 DIAGNOSIS — M6281 Muscle weakness (generalized): Secondary | ICD-10-CM | POA: Diagnosis not present

## 2020-07-12 DIAGNOSIS — R0789 Other chest pain: Secondary | ICD-10-CM | POA: Diagnosis not present

## 2020-07-12 DIAGNOSIS — R2681 Unsteadiness on feet: Secondary | ICD-10-CM | POA: Diagnosis not present

## 2020-07-13 DIAGNOSIS — R0789 Other chest pain: Secondary | ICD-10-CM | POA: Diagnosis not present

## 2020-07-13 DIAGNOSIS — M6281 Muscle weakness (generalized): Secondary | ICD-10-CM | POA: Diagnosis not present

## 2020-07-13 DIAGNOSIS — Z9181 History of falling: Secondary | ICD-10-CM | POA: Diagnosis not present

## 2020-07-13 DIAGNOSIS — M5459 Other low back pain: Secondary | ICD-10-CM | POA: Diagnosis not present

## 2020-07-13 DIAGNOSIS — R2681 Unsteadiness on feet: Secondary | ICD-10-CM | POA: Diagnosis not present

## 2020-07-13 DIAGNOSIS — I480 Paroxysmal atrial fibrillation: Secondary | ICD-10-CM | POA: Diagnosis not present

## 2020-07-15 DIAGNOSIS — R0789 Other chest pain: Secondary | ICD-10-CM | POA: Diagnosis not present

## 2020-07-15 DIAGNOSIS — R2681 Unsteadiness on feet: Secondary | ICD-10-CM | POA: Diagnosis not present

## 2020-07-15 DIAGNOSIS — I480 Paroxysmal atrial fibrillation: Secondary | ICD-10-CM | POA: Diagnosis not present

## 2020-07-15 DIAGNOSIS — M5459 Other low back pain: Secondary | ICD-10-CM | POA: Diagnosis not present

## 2020-07-15 DIAGNOSIS — Z9181 History of falling: Secondary | ICD-10-CM | POA: Diagnosis not present

## 2020-07-15 DIAGNOSIS — M6281 Muscle weakness (generalized): Secondary | ICD-10-CM | POA: Diagnosis not present

## 2020-07-18 DIAGNOSIS — M6281 Muscle weakness (generalized): Secondary | ICD-10-CM | POA: Diagnosis not present

## 2020-07-18 DIAGNOSIS — R0789 Other chest pain: Secondary | ICD-10-CM | POA: Diagnosis not present

## 2020-07-18 DIAGNOSIS — M5459 Other low back pain: Secondary | ICD-10-CM | POA: Diagnosis not present

## 2020-07-18 DIAGNOSIS — R2681 Unsteadiness on feet: Secondary | ICD-10-CM | POA: Diagnosis not present

## 2020-07-18 DIAGNOSIS — Z9181 History of falling: Secondary | ICD-10-CM | POA: Diagnosis not present

## 2020-07-18 DIAGNOSIS — I480 Paroxysmal atrial fibrillation: Secondary | ICD-10-CM | POA: Diagnosis not present

## 2020-07-19 DIAGNOSIS — M5459 Other low back pain: Secondary | ICD-10-CM | POA: Diagnosis not present

## 2020-07-19 DIAGNOSIS — Z9181 History of falling: Secondary | ICD-10-CM | POA: Diagnosis not present

## 2020-07-19 DIAGNOSIS — R0789 Other chest pain: Secondary | ICD-10-CM | POA: Diagnosis not present

## 2020-07-19 DIAGNOSIS — R2681 Unsteadiness on feet: Secondary | ICD-10-CM | POA: Diagnosis not present

## 2020-07-19 DIAGNOSIS — I480 Paroxysmal atrial fibrillation: Secondary | ICD-10-CM | POA: Diagnosis not present

## 2020-07-19 DIAGNOSIS — M6281 Muscle weakness (generalized): Secondary | ICD-10-CM | POA: Diagnosis not present

## 2020-07-20 DIAGNOSIS — Z85828 Personal history of other malignant neoplasm of skin: Secondary | ICD-10-CM | POA: Diagnosis not present

## 2020-07-20 DIAGNOSIS — R2681 Unsteadiness on feet: Secondary | ICD-10-CM | POA: Diagnosis not present

## 2020-07-20 DIAGNOSIS — I252 Old myocardial infarction: Secondary | ICD-10-CM | POA: Diagnosis not present

## 2020-07-20 DIAGNOSIS — R1311 Dysphagia, oral phase: Secondary | ICD-10-CM | POA: Diagnosis not present

## 2020-07-20 DIAGNOSIS — I480 Paroxysmal atrial fibrillation: Secondary | ICD-10-CM | POA: Diagnosis not present

## 2020-07-20 DIAGNOSIS — R0789 Other chest pain: Secondary | ICD-10-CM | POA: Diagnosis not present

## 2020-07-20 DIAGNOSIS — R41841 Cognitive communication deficit: Secondary | ICD-10-CM | POA: Diagnosis not present

## 2020-07-20 DIAGNOSIS — E785 Hyperlipidemia, unspecified: Secondary | ICD-10-CM | POA: Diagnosis not present

## 2020-07-20 DIAGNOSIS — Z9181 History of falling: Secondary | ICD-10-CM | POA: Diagnosis not present

## 2020-07-20 DIAGNOSIS — M1990 Unspecified osteoarthritis, unspecified site: Secondary | ICD-10-CM | POA: Diagnosis not present

## 2020-07-20 DIAGNOSIS — M5459 Other low back pain: Secondary | ICD-10-CM | POA: Diagnosis not present

## 2020-07-20 DIAGNOSIS — Z96651 Presence of right artificial knee joint: Secondary | ICD-10-CM | POA: Diagnosis not present

## 2020-07-20 DIAGNOSIS — I5032 Chronic diastolic (congestive) heart failure: Secondary | ICD-10-CM | POA: Diagnosis not present

## 2020-07-20 DIAGNOSIS — R2689 Other abnormalities of gait and mobility: Secondary | ICD-10-CM | POA: Diagnosis not present

## 2020-07-20 DIAGNOSIS — I341 Nonrheumatic mitral (valve) prolapse: Secondary | ICD-10-CM | POA: Diagnosis not present

## 2020-07-20 DIAGNOSIS — E2740 Unspecified adrenocortical insufficiency: Secondary | ICD-10-CM | POA: Diagnosis not present

## 2020-07-20 DIAGNOSIS — G629 Polyneuropathy, unspecified: Secondary | ICD-10-CM | POA: Diagnosis not present

## 2020-07-20 DIAGNOSIS — G9009 Other idiopathic peripheral autonomic neuropathy: Secondary | ICD-10-CM | POA: Diagnosis not present

## 2020-07-20 DIAGNOSIS — M6281 Muscle weakness (generalized): Secondary | ICD-10-CM | POA: Diagnosis not present

## 2020-07-22 ENCOUNTER — Non-Acute Institutional Stay (SKILLED_NURSING_FACILITY): Payer: Medicare Other | Admitting: Nurse Practitioner

## 2020-07-22 ENCOUNTER — Encounter: Payer: Self-pay | Admitting: Nurse Practitioner

## 2020-07-22 DIAGNOSIS — R350 Frequency of micturition: Secondary | ICD-10-CM

## 2020-07-22 DIAGNOSIS — E782 Mixed hyperlipidemia: Secondary | ICD-10-CM | POA: Diagnosis not present

## 2020-07-22 DIAGNOSIS — K219 Gastro-esophageal reflux disease without esophagitis: Secondary | ICD-10-CM

## 2020-07-22 DIAGNOSIS — I4891 Unspecified atrial fibrillation: Secondary | ICD-10-CM

## 2020-07-22 DIAGNOSIS — F339 Major depressive disorder, recurrent, unspecified: Secondary | ICD-10-CM

## 2020-07-22 DIAGNOSIS — R627 Adult failure to thrive: Secondary | ICD-10-CM | POA: Diagnosis not present

## 2020-07-22 DIAGNOSIS — K529 Noninfective gastroenteritis and colitis, unspecified: Secondary | ICD-10-CM | POA: Diagnosis not present

## 2020-07-22 DIAGNOSIS — D500 Iron deficiency anemia secondary to blood loss (chronic): Secondary | ICD-10-CM

## 2020-07-22 DIAGNOSIS — M79672 Pain in left foot: Secondary | ICD-10-CM | POA: Diagnosis not present

## 2020-07-22 DIAGNOSIS — B351 Tinea unguium: Secondary | ICD-10-CM | POA: Diagnosis not present

## 2020-07-22 DIAGNOSIS — G63 Polyneuropathy in diseases classified elsewhere: Secondary | ICD-10-CM | POA: Diagnosis not present

## 2020-07-22 DIAGNOSIS — F0150 Vascular dementia without behavioral disturbance: Secondary | ICD-10-CM

## 2020-07-22 DIAGNOSIS — M79671 Pain in right foot: Secondary | ICD-10-CM | POA: Diagnosis not present

## 2020-07-22 NOTE — Assessment & Plan Note (Signed)
IDA Hgb Hgb 13.9 06/02/20, off Iron
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
## 2020-07-22 NOTE — Assessment & Plan Note (Signed)
Depression, stable, takes Mirtazapine.

## 2020-07-22 NOTE — Assessment & Plan Note (Signed)
GERD/Hx of GI bleed, takes Pantoprazole, Famotdine.  

## 2020-07-22 NOTE — Assessment & Plan Note (Signed)
Memory deficict, off Mamentine.

## 2020-07-22 NOTE — Assessment & Plan Note (Signed)
Urinary frequency, chronic. 

## 2020-07-22 NOTE — Assessment & Plan Note (Signed)
Chronic diarrhea, stable presently, recent C-diff, fully treated

## 2020-07-22 NOTE — Assessment & Plan Note (Signed)
Hyperlipidemia LDL 66 in the past.

## 2020-07-22 NOTE — Assessment & Plan Note (Signed)
FTT, gradual weight loss, regardless Mirtazapine.

## 2020-07-22 NOTE — Assessment & Plan Note (Signed)
Peripheral neuropathy, takes Gabapentin, Keppra, Tylenol. 

## 2020-07-25 ENCOUNTER — Other Ambulatory Visit: Payer: Self-pay

## 2020-07-25 ENCOUNTER — Encounter: Payer: Self-pay | Admitting: Cardiovascular Disease

## 2020-07-25 ENCOUNTER — Ambulatory Visit (INDEPENDENT_AMBULATORY_CARE_PROVIDER_SITE_OTHER): Payer: Medicare Other | Admitting: Cardiovascular Disease

## 2020-07-25 VITALS — BP 107/55 | HR 53 | Ht 64.0 in | Wt 92.6 lb

## 2020-07-25 DIAGNOSIS — I350 Nonrheumatic aortic (valve) stenosis: Secondary | ICD-10-CM | POA: Diagnosis not present

## 2020-07-25 DIAGNOSIS — I480 Paroxysmal atrial fibrillation: Secondary | ICD-10-CM

## 2020-07-25 DIAGNOSIS — I10 Essential (primary) hypertension: Secondary | ICD-10-CM | POA: Diagnosis not present

## 2020-07-25 DIAGNOSIS — I340 Nonrheumatic mitral (valve) insufficiency: Secondary | ICD-10-CM | POA: Diagnosis not present

## 2020-07-25 DIAGNOSIS — I272 Pulmonary hypertension, unspecified: Secondary | ICD-10-CM

## 2020-07-25 MED ORDER — AMIODARONE HCL 100 MG PO TABS: 100.0000 mg | ORAL_TABLET | ORAL | 3 refills | Status: AC

## 2020-07-25 MED ORDER — AMIODARONE HCL 100 MG PO TABS: 100.0000 mg | ORAL_TABLET | ORAL | 3 refills | Status: DC

## 2020-07-25 NOTE — Patient Instructions (Addendum)
Medication Instructions:  Decrease your amiodarone to 100 mg EVERY OTHER DAY *If you need a refill on your cardiac medications before your next appointment, please call your pharmacy*   Lab Work: None ordered If you have labs (blood work) drawn today and your tests are completely normal, you will receive your results only by: Marland Kitchen MyChart Message (if you have MyChart) OR . A paper copy in the mail If you have any lab test that is abnormal or we need to change your treatment, we will call you to review the results.   Testing/Procedures: None ordered   Follow-Up: At [facility], you and your health needs are our priority.  As part of our continuing mission to provide you with exceptional heart care, we have created designated Provider Care Teams.  These Care Teams include your primary Cardiologist (physician) and Advanced Practice Providers (APPs -  Physician Assistants and Nurse Practitioners) who all work together to provide you with the care you need, when you need it.  We recommend signing up for the patient portal called "MyChart".  Sign up information is provided on this After Visit Summary.  MyChart is used to connect with patients for Virtual Visits (Telemedicine).  Patients are able to view lab/test results, encounter notes, upcoming appointments, etc.  Non-urgent messages can be sent to your provider as well.   To learn more about what you can do with MyChart, go to [email or web address].    Your next appointment:   6 month(s)  The format for your next appointment:   In Person  Provider:   Shelva Majestic, MD   Other Instructions None

## 2020-07-25 NOTE — Progress Notes (Signed)
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 annulus with mild stenosis and moderate regurgitation.  She had moderate LA dilatation and moderate TR with significant pulmonary pressure elevation at 74 mm Hg.  Cardiac catheterization did not reveal significant coronary obstructive disease.  Following diuresis she did not have significant pulmonary hypertension and her PA pressure on right heart catheterization was only 33 mm systolically.   An echo Doppler study of May 2018 continued to show normal systolic function with grade 2 diastolic dysfunction and suggested progression of her mitral valve disease to severe mitral regurgitation in the setting of a severely dilated left atrium, mild aortic stenosis with trivial AR, and a PA pressure estimated at 64 mm.  Her echo in April 2019 showed normal systolic function with EF at 60 to 65% with mild LVH.  She now had mild aortic stenosis with a mean gradient of 19 and peak gradient of 31 mm.  There was moderate mitral regurgitation.  There was severe left atrial dilatation with mild RA dilatation.  Peak PA pressure was 65 mm.  She had developed recurrent atrial fibrillation after  developing a GI bleed with profound drop in hemoglobin to 4.  She has been maintaining sinus rhythm and is now on a reduced dose of amiodarone at 200 mg alternating with 100 mg every other day.  Her most recent echo in September 2020 showed hyperdynamic LV function with EF greater than 65%.  There was moderate aortic stenosis with a mean gradient of 27 mm.  She did have pulmonary hypertension with increased PA pressure at 67 mm and moderately severe mitral regurgitation.  When seen remotely, I had reduced her amiodarone to 100 mg daily.  Her ECG today shows sinus bradycardia at 53 bpm.  She has had several falls and is now on skilled nursing care at friend's home [place] rather than her previous independent living.  I am recommending further reduction of amiodarone to 100 mg every other day.  There is no significant blood pressure differential today in her arms bilaterally which had been noted previously.  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  Her medications and administer to her at [facility] in the skilled nursing unit.  She will continue current therapy as prescribed.  Laboratory from June 02, 2020 was reviewed.  Hemoglobin hematocrit were stable at 13.9 and 40, respectively.  Renal function was stable with a creatinine of 0.7.  Continues to be on pantoprazole for GERD and gabapentin for neuropathy.  I will see  her in 6 months for reevaluation or sooner as needed.   Troy Sine, MD, [facility]  07/27/2020 6:14 PM

## 2020-07-26 DIAGNOSIS — I480 Paroxysmal atrial fibrillation: Secondary | ICD-10-CM | POA: Diagnosis not present

## 2020-07-26 DIAGNOSIS — M5459 Other low back pain: Secondary | ICD-10-CM | POA: Diagnosis not present

## 2020-07-26 DIAGNOSIS — R0789 Other chest pain: Secondary | ICD-10-CM | POA: Diagnosis not present

## 2020-07-26 DIAGNOSIS — R2681 Unsteadiness on feet: Secondary | ICD-10-CM | POA: Diagnosis not present

## 2020-07-26 DIAGNOSIS — Z9181 History of falling: Secondary | ICD-10-CM | POA: Diagnosis not present

## 2020-07-26 DIAGNOSIS — M6281 Muscle weakness (generalized): Secondary | ICD-10-CM | POA: Diagnosis not present

## 2020-07-27 ENCOUNTER — Encounter: Payer: Self-pay | Admitting: Cardiovascular Disease

## 2020-07-28 DIAGNOSIS — I480 Paroxysmal atrial fibrillation: Secondary | ICD-10-CM | POA: Diagnosis not present

## 2020-07-28 DIAGNOSIS — R2681 Unsteadiness on feet: Secondary | ICD-10-CM | POA: Diagnosis not present

## 2020-07-28 DIAGNOSIS — R0789 Other chest pain: Secondary | ICD-10-CM | POA: Diagnosis not present

## 2020-07-28 DIAGNOSIS — M6281 Muscle weakness (generalized): Secondary | ICD-10-CM | POA: Diagnosis not present

## 2020-07-28 DIAGNOSIS — Z9181 History of falling: Secondary | ICD-10-CM | POA: Diagnosis not present

## 2020-07-28 DIAGNOSIS — M5459 Other low back pain: Secondary | ICD-10-CM | POA: Diagnosis not present

## 2020-08-02 DIAGNOSIS — Z9181 History of falling: Secondary | ICD-10-CM | POA: Diagnosis not present

## 2020-08-02 DIAGNOSIS — R0789 Other chest pain: Secondary | ICD-10-CM | POA: Diagnosis not present

## 2020-08-02 DIAGNOSIS — M6281 Muscle weakness (generalized): Secondary | ICD-10-CM | POA: Diagnosis not present

## 2020-08-02 DIAGNOSIS — R2681 Unsteadiness on feet: Secondary | ICD-10-CM | POA: Diagnosis not present

## 2020-08-02 DIAGNOSIS — I480 Paroxysmal atrial fibrillation: Secondary | ICD-10-CM | POA: Diagnosis not present

## 2020-08-02 DIAGNOSIS — M5459 Other low back pain: Secondary | ICD-10-CM | POA: Diagnosis not present

## 2020-08-04 DIAGNOSIS — M6281 Muscle weakness (generalized): Secondary | ICD-10-CM | POA: Diagnosis not present

## 2020-08-04 DIAGNOSIS — I480 Paroxysmal atrial fibrillation: Secondary | ICD-10-CM | POA: Diagnosis not present

## 2020-08-04 DIAGNOSIS — M5459 Other low back pain: Secondary | ICD-10-CM | POA: Diagnosis not present

## 2020-08-04 DIAGNOSIS — Z9181 History of falling: Secondary | ICD-10-CM | POA: Diagnosis not present

## 2020-08-04 DIAGNOSIS — R2681 Unsteadiness on feet: Secondary | ICD-10-CM | POA: Diagnosis not present

## 2020-08-04 DIAGNOSIS — R0789 Other chest pain: Secondary | ICD-10-CM | POA: Diagnosis not present

## 2020-08-25 ENCOUNTER — Non-Acute Institutional Stay (SKILLED_NURSING_FACILITY): Payer: Medicare Other | Admitting: Internal Medicine

## 2020-08-25 DIAGNOSIS — G63 Polyneuropathy in diseases classified elsewhere: Secondary | ICD-10-CM | POA: Diagnosis not present

## 2020-08-25 DIAGNOSIS — K219 Gastro-esophageal reflux disease without esophagitis: Secondary | ICD-10-CM

## 2020-08-25 DIAGNOSIS — D500 Iron deficiency anemia secondary to blood loss (chronic): Secondary | ICD-10-CM | POA: Diagnosis not present

## 2020-08-25 DIAGNOSIS — F339 Major depressive disorder, recurrent, unspecified: Secondary | ICD-10-CM

## 2020-08-25 DIAGNOSIS — I4891 Unspecified atrial fibrillation: Secondary | ICD-10-CM | POA: Diagnosis not present

## 2020-08-25 DIAGNOSIS — I38 Endocarditis, valve unspecified: Secondary | ICD-10-CM

## 2020-08-25 DIAGNOSIS — E782 Mixed hyperlipidemia: Secondary | ICD-10-CM

## 2020-08-25 DIAGNOSIS — R413 Other amnesia: Secondary | ICD-10-CM | POA: Diagnosis not present

## 2020-08-25 DIAGNOSIS — K529 Noninfective gastroenteritis and colitis, unspecified: Secondary | ICD-10-CM

## 2020-08-28 ENCOUNTER — Encounter: Payer: Self-pay | Admitting: Internal Medicine
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
## 2020-09-20 ENCOUNTER — Encounter: Payer: Self-pay | Admitting: Nurse Practitioner

## 2020-09-20 ENCOUNTER — Non-Acute Institutional Stay (SKILLED_NURSING_FACILITY): Payer: Medicare Other | Admitting: Nurse Practitioner

## 2020-09-20 DIAGNOSIS — E785 Hyperlipidemia, unspecified: Secondary | ICD-10-CM

## 2020-09-20 DIAGNOSIS — R627 Adult failure to thrive: Secondary | ICD-10-CM | POA: Diagnosis not present

## 2020-09-20 DIAGNOSIS — G63 Polyneuropathy in diseases classified elsewhere: Secondary | ICD-10-CM

## 2020-09-20 DIAGNOSIS — F339 Major depressive disorder, recurrent, unspecified: Secondary | ICD-10-CM | POA: Diagnosis not present

## 2020-09-20 DIAGNOSIS — K219 Gastro-esophageal reflux disease without esophagitis: Secondary | ICD-10-CM

## 2020-09-20 DIAGNOSIS — K529 Noninfective gastroenteritis and colitis, unspecified: Secondary | ICD-10-CM

## 2020-09-20 DIAGNOSIS — F0150 Vascular dementia without behavioral disturbance: Secondary | ICD-10-CM | POA: Diagnosis not present

## 2020-09-20 DIAGNOSIS — I4891 Unspecified atrial fibrillation: Secondary | ICD-10-CM

## 2020-09-20 DIAGNOSIS — D500 Iron deficiency anemia secondary to blood loss (chronic): Secondary | ICD-10-CM | POA: Diagnosis not present

## 2020-09-20 DIAGNOSIS — R350 Frequency of micturition: Secondary | ICD-10-CM | POA: Diagnosis not present

## 2020-09-20 NOTE — Assessment & Plan Note (Signed)
Memory deficict, off Mamentine.  

## 2020-09-20 NOTE — Assessment & Plan Note (Signed)
Hyperlipidemia LDL 66 in the past.   

## 2020-09-20 NOTE — Assessment & Plan Note (Signed)
Peripheral neuropathy, takes Gabapentin, Keppra, Tylenol. 

## 2020-09-20 NOTE — Assessment & Plan Note (Signed)
Depression, stable, takes Mirtazapine.   

## 2020-09-20 NOTE — Assessment & Plan Note (Signed)
Urinary frequency, chronic. 

## 2020-09-20 NOTE — Assessment & Plan Note (Signed)
IDA Hgb Hgb 13.9 06/02/20, off Iron  
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
## 2020-09-20 NOTE — Assessment & Plan Note (Signed)
Chronic diarrhea, stable presently, recent C-diff, fully treated, takes Colestipol, f/u GI ° °

## 2020-09-20 NOTE — Assessment & Plan Note (Signed)
GERD/Hx of GI bleed, takes Pantoprazole, Famotdine.  

## 2020-09-20 NOTE — Assessment & Plan Note (Signed)
FTT, gradual weight loss, regardless Mirtazapine.   

## 2020-10-21 ENCOUNTER — Encounter: Payer: Self-pay | Admitting: Internal Medicine

## 2020-10-21 ENCOUNTER — Non-Acute Institutional Stay (SKILLED_NURSING_FACILITY): Payer: Medicare Other | Admitting: Internal Medicine

## 2020-10-21 DIAGNOSIS — R531 Weakness: Secondary | ICD-10-CM | POA: Diagnosis not present

## 2020-10-21 DIAGNOSIS — I38 Endocarditis, valve unspecified: Secondary | ICD-10-CM

## 2020-10-21 DIAGNOSIS — K219 Gastro-esophageal reflux disease without esophagitis: Secondary | ICD-10-CM

## 2020-10-21 DIAGNOSIS — K529 Noninfective gastroenteritis and colitis, unspecified: Secondary | ICD-10-CM | POA: Diagnosis not present

## 2020-10-21 DIAGNOSIS — I4891 Unspecified atrial fibrillation: Secondary | ICD-10-CM

## 2020-10-21 DIAGNOSIS — G63 Polyneuropathy in diseases classified elsewhere: Secondary | ICD-10-CM | POA: Diagnosis not present
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
## 2020-10-24 DIAGNOSIS — E039 Hypothyroidism, unspecified: Secondary | ICD-10-CM | POA: Diagnosis not present

## 2020-10-24 DIAGNOSIS — D649 Anemia, unspecified: Secondary | ICD-10-CM | POA: Diagnosis not present

## 2020-10-25 ENCOUNTER — Non-Acute Institutional Stay (SKILLED_NURSING_FACILITY): Payer: Medicare Other | Admitting: Nurse Practitioner

## 2020-10-25 ENCOUNTER — Encounter: Payer: Self-pay | Admitting: Nurse Practitioner

## 2020-10-25 DIAGNOSIS — R627 Adult failure to thrive: Secondary | ICD-10-CM | POA: Diagnosis not present

## 2020-10-25 DIAGNOSIS — R350 Frequency of micturition: Secondary | ICD-10-CM

## 2020-10-25 DIAGNOSIS — K529 Noninfective gastroenteritis and colitis, unspecified: Secondary | ICD-10-CM

## 2020-10-25 DIAGNOSIS — G63 Polyneuropathy in diseases classified elsewhere: Secondary | ICD-10-CM

## 2020-10-25 DIAGNOSIS — E7800 Pure hypercholesterolemia, unspecified: Secondary | ICD-10-CM | POA: Diagnosis not present

## 2020-10-25 DIAGNOSIS — K219 Gastro-esophageal reflux disease without esophagitis: Secondary | ICD-10-CM | POA: Diagnosis not present

## 2020-10-25 DIAGNOSIS — F339 Major depressive disorder, recurrent, unspecified: Secondary | ICD-10-CM | POA: Diagnosis not present

## 2020-10-25 DIAGNOSIS — I38 Endocarditis, valve unspecified: Secondary | ICD-10-CM

## 2020-10-25 DIAGNOSIS — D500 Iron deficiency anemia secondary to blood loss (chronic): Secondary | ICD-10-CM

## 2020-10-25 DIAGNOSIS — I4891 Unspecified atrial fibrillation: Secondary | ICD-10-CM | POA: Diagnosis not present

## 2020-10-25 DIAGNOSIS — E871 Hypo-osmolality and hyponatremia: Secondary | ICD-10-CM | POA: Diagnosis not present

## 2020-10-25 DIAGNOSIS — F0150 Vascular dementia without behavioral disturbance: Secondary | ICD-10-CM | POA: Diagnosis not present

## 2020-10-25 LAB — CBC AND DIFFERENTIAL
HCT: 38 (ref 36–46)
Hemoglobin: 13.3 (ref 12.0–16.0)
Neutrophils Absolute: 4128
Platelets: 212 (ref 150–399)
WBC: 6.8

## 2020-10-25 LAB — BASIC METABOLIC PANEL
BUN: 20 (ref 4–21)
CO2: 28 — AB (ref 13–22)
Chloride: 95 — AB (ref 99–108)
Creatinine: 0.6 (ref 0.5–1.1)
Glucose: 80
Potassium: 4.2 (ref 3.4–5.3)
Sodium: 132 — AB (ref 137–147)

## 2020-10-25 LAB — CBC: RBC: 4.2 (ref 3.87–5.11)

## 2020-10-25 LAB — COMPREHENSIVE METABOLIC PANEL
Albumin: 3.7 (ref 3.5–5.0)
Calcium: 9.3 (ref 8.7–10.7)
Globulin: 3

## 2020-10-25 LAB — TSH: TSH: 1.54 (ref 0.41–5.90)

## 2020-10-25 LAB — HEPATIC FUNCTION PANEL
ALT: 6 — AB (ref 7–35)
AST: 13 (ref 13–35)
Alkaline Phosphatase: 54 (ref 25–125)
Bilirubin, Total: 0.5

## 2020-10-25 NOTE — Assessment & Plan Note (Signed)
heart rate is in control, takes Amiodarone, not anticoagulated due to hx of GI Bleed.

## 2020-10-25 NOTE — Assessment & Plan Note (Signed)
Hgb 13.3 10/24/20, off Iron

## 2020-10-25 NOTE — Assessment & Plan Note (Signed)
Resides in [facility] for supportive care, off [place], no behavioral issues.

## 2020-10-25 NOTE — Assessment & Plan Note (Signed)
Na 132 10/24/20, Bun/creat 20/0.56 10/24/20  

## 2020-10-25 NOTE — Assessment & Plan Note (Addendum)
Valvular heart disease, stable. Moderate AS, MR, TR- severe LAE, normal LVF-Oct 2019 

## 2020-10-25 NOTE — Assessment & Plan Note (Signed)
GERD/Hx of GI bleed, takes Pantoprazole, Famotdine.  

## 2020-10-25 NOTE — Assessment & Plan Note (Signed)
LDL 66 in the past.  

## 2020-10-25 NOTE — Assessment & Plan Note (Signed)
Chronic, remains no change.

## 2020-10-25 NOTE — Assessment & Plan Note (Signed)
takes Gabapentin, Keppra, Tylenol.

## 2020-10-25 NOTE — Assessment & Plan Note (Signed)
gradual weight loss, regardless Mirtazapine.don't thinks DEXA is needed presently.

## 2020-10-25 NOTE — Assessment & Plan Note (Signed)
stable presently, recent C-diff, fullytreated, takes Colestipol, f/u GI

## 2020-10-25 NOTE — Assessment & Plan Note (Signed)
stable, takes Mirtazapine. TSH 1.54 10/24/20 
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
## 2020-10-28 ENCOUNTER — Encounter: Payer: Self-pay | Admitting: Nurse Practitioner

## 2020-11-04 ENCOUNTER — Telehealth: Payer: Self-pay | Admitting: Adult Health

## 2020-11-04 DIAGNOSIS — M79671 Pain in right foot: Secondary | ICD-10-CM | POA: Diagnosis not present

## 2020-11-04 DIAGNOSIS — M79672 Pain in left foot: Secondary | ICD-10-CM | POA: Diagnosis not present

## 2020-11-04 DIAGNOSIS — B351 Tinea unguium: Secondary | ICD-10-CM | POA: Diagnosis not present

## 2020-11-04 NOTE — Telephone Encounter (Signed)
Nurse called to report that [name] is having right rib pain and right hip pain after bumping into something. It hurts when she walks. The nurse is not sure of how the injury occurred. She is requesting an xray. Xray ordered of the right hip and right rib cage. Nurse also requested pain medication and reports this resident had an order for Norco for pain  that expired. She tolerated it well.  Norco reactivated for two days as previously ordered q 6 prn.

## 2020-11-05 DIAGNOSIS — M25552 Pain in left hip: Secondary | ICD-10-CM | POA: Diagnosis not present

## 2020-11-05 DIAGNOSIS — R079 Chest pain, unspecified: Secondary | ICD-10-CM | POA: Diagnosis not present

## 2020-11-07 ENCOUNTER — Non-Acute Institutional Stay (SKILLED_NURSING_FACILITY): Payer: Medicare Other | Admitting: Nurse Practitioner

## 2020-11-07 ENCOUNTER — Encounter: Payer: Self-pay | Admitting: Nurse Practitioner

## 2020-11-07 DIAGNOSIS — F339 Major depressive disorder, recurrent, unspecified: Secondary | ICD-10-CM

## 2020-11-07 DIAGNOSIS — R627 Adult failure to thrive: Secondary | ICD-10-CM | POA: Diagnosis not present

## 2020-11-07 DIAGNOSIS — D508 Other iron deficiency anemias: Secondary | ICD-10-CM

## 2020-11-07 DIAGNOSIS — I4891 Unspecified atrial fibrillation: Secondary | ICD-10-CM

## 2020-11-07 DIAGNOSIS — E782 Mixed hyperlipidemia: Secondary | ICD-10-CM | POA: Diagnosis not present

## 2020-11-07 DIAGNOSIS — R0789 Other chest pain: Secondary | ICD-10-CM | POA: Diagnosis not present

## 2020-11-07 DIAGNOSIS — K529 Noninfective gastroenteritis and colitis, unspecified: Secondary | ICD-10-CM

## 2020-11-07 DIAGNOSIS — F0150 Vascular dementia without behavioral disturbance: Secondary | ICD-10-CM | POA: Diagnosis not present

## 2020-11-07 DIAGNOSIS — G63 Polyneuropathy in diseases classified elsewhere: Secondary | ICD-10-CM

## 2020-11-07 DIAGNOSIS — I38 Endocarditis, valve unspecified: Secondary | ICD-10-CM

## 2020-11-07 DIAGNOSIS — E871 Hypo-osmolality and hyponatremia: Secondary | ICD-10-CM

## 2020-11-07 DIAGNOSIS — R350 Frequency of micturition: Secondary | ICD-10-CM

## 2020-11-07 DIAGNOSIS — K219 Gastro-esophageal reflux disease without esophagitis: Secondary | ICD-10-CM | POA: Diagnosis not present

## 2020-11-07 NOTE — Assessment & Plan Note (Signed)
Na 132 10/24/20, Bun/creat 20/0.56 10/24/20  

## 2020-11-07 NOTE — Assessment & Plan Note (Signed)
Functioning well in SNF [facility], no behavioral issues, off Mamentine.

## 2020-11-07 NOTE — Assessment & Plan Note (Signed)
Urinary frequency, chronic

## 2020-11-07 NOTE — Assessment & Plan Note (Signed)
Hgb 13.3 10/24/20, off Iron

## 2020-11-07 NOTE — Assessment & Plan Note (Signed)
Chronic diarrhea, stable presently, recent C-diff, fullytreated, takes Colestipol, f/u GI

## 2020-11-07 NOTE — Assessment & Plan Note (Signed)
lateral left rib cage pain, positional x 3 days after a wrong movement. Denied chest pain, cough, SOB, dysuria, blood in urine, or SOB, initial Nor prn x 2 days started 11/04/20 helped, already taking Tylenol. X-ray L ribs/hip 11/05/20 no acute fxs.  11/07/20 will have Norco 5/32m q6hr prn x 1week, obtain CXR ap/lateral, X-ray of Thoracic spine, update CBC/diff, CMP/eGFR, observe.

## 2020-11-07 NOTE — Assessment & Plan Note (Signed)
Valvular heart disease, stable. Moderate AS, MR, TR- severe LAE, normal LVF-Oct 2019 

## 2020-11-07 NOTE — Assessment & Plan Note (Signed)
gradual weight loss, regardless Mirtazapine.don't thinks DEXA is needed presently.

## 2020-11-07 NOTE — Assessment & Plan Note (Signed)
GERD/Hx of GI bleed, takes Pantoprazole, Famotdine.  

## 2020-11-07 NOTE — Assessment & Plan Note (Signed)
LDL 66 in the past.  

## 2020-11-07 NOTE — Assessment & Plan Note (Signed)
   Afib, heart rate is in control, takes Amiodarone, not anticoagulated due to hx of GI Bleed.

## 2020-11-07 NOTE — Assessment & Plan Note (Signed)
Peripheral neuropathy, takes Gabapentin, Keppra, Tylenol. 
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
## 2020-11-07 NOTE — Assessment & Plan Note (Signed)
stable, takes Mirtazapine. TSH 1.54 10/24/20 

## 2020-11-08 ENCOUNTER — Encounter: Payer: Self-pay | Admitting: Nurse Practitioner

## 2020-11-08 DIAGNOSIS — M546 Pain in thoracic spine: Secondary | ICD-10-CM | POA: Diagnosis not present

## 2020-11-08 DIAGNOSIS — R079 Chest pain, unspecified: Secondary | ICD-10-CM | POA: Diagnosis not present

## 2020-11-10 DIAGNOSIS — D649 Anemia, unspecified: Secondary | ICD-10-CM | POA: Diagnosis not present

## 2020-11-10 LAB — BASIC METABOLIC PANEL
BUN: 16 (ref 4–21)
CO2: 27 — AB (ref 13–22)
Chloride: 98 — AB (ref 99–108)
Creatinine: 0.5 (ref 0.5–1.1)
Glucose: 85
Potassium: 4 (ref 3.4–5.3)
Sodium: 137 (ref 137–147)

## 2020-11-10 LAB — CBC AND DIFFERENTIAL
HCT: 38 (ref 36–46)
Hemoglobin: 13 (ref 12.0–16.0)
Neutrophils Absolute: 4899
Platelets: 237 (ref 150–399)
WBC: 7.4

## 2020-11-10 LAB — CBC: RBC: 4.16 (ref 3.87–5.11)

## 2020-11-10 LAB — COMPREHENSIVE METABOLIC PANEL
Albumin: 3.6 (ref 3.5–5.0)
Calcium: 9.2 (ref 8.7–10.7)
Globulin: 3.1

## 2020-11-10 LAB — HEPATIC FUNCTION PANEL
ALT: 9 (ref 7–35)
AST: 14 (ref 13–35)
Alkaline Phosphatase: 57 (ref 25–125)
Bilirubin, Total: 0.4

## 2020-11-24 DIAGNOSIS — Z23 Encounter for immunization: Secondary | ICD-10-CM | POA: Diagnosis not present

## 2020-12-01 ENCOUNTER — Non-Acute Institutional Stay (SKILLED_NURSING_FACILITY): Payer: Medicare Other | Admitting: Internal Medicine

## 2020-12-01 ENCOUNTER — Encounter: Payer: Self-pay | Admitting: Internal Medicine

## 2020-12-01 ENCOUNTER — Other Ambulatory Visit: Payer: Self-pay

## 2020-12-01 DIAGNOSIS — D508 Other iron deficiency anemias: Secondary | ICD-10-CM

## 2020-12-01 DIAGNOSIS — I4891 Unspecified atrial fibrillation: Secondary | ICD-10-CM | POA: Diagnosis not present

## 2020-12-01 DIAGNOSIS — F339 Major depressive disorder, recurrent, unspecified: Secondary | ICD-10-CM

## 2020-12-01 DIAGNOSIS — K529 Noninfective gastroenteritis and colitis, unspecified: Secondary | ICD-10-CM | POA: Diagnosis not present

## 2020-12-01 DIAGNOSIS — E782 Mixed hyperlipidemia: Secondary | ICD-10-CM | POA: Diagnosis not present

## 2020-12-01 DIAGNOSIS — G63 Polyneuropathy in diseases classified elsewhere: Secondary | ICD-10-CM
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
## 2020-12-02 DIAGNOSIS — K5900 Constipation, unspecified: Secondary | ICD-10-CM | POA: Diagnosis not present

## 2020-12-03 ENCOUNTER — Encounter: Payer: Self-pay | Admitting: Internal Medicine

## 2020-12-20 DIAGNOSIS — H401131 Primary open-angle glaucoma, bilateral, mild stage: Secondary | ICD-10-CM | POA: Diagnosis not present

## 2020-12-20 DIAGNOSIS — H2513 Age-related nuclear cataract, bilateral: Secondary | ICD-10-CM | POA: Diagnosis not present

## 2020-12-20 DIAGNOSIS — H35412 Lattice degeneration of retina, left eye: Secondary | ICD-10-CM | POA: Diagnosis not present

## 2020-12-22 DIAGNOSIS — Z85828 Personal history of other malignant neoplasm of skin: Secondary | ICD-10-CM | POA: Diagnosis not present

## 2020-12-22 DIAGNOSIS — L821 Other seborrheic keratosis: Secondary | ICD-10-CM | POA: Diagnosis not present

## 2020-12-22 DIAGNOSIS — L570 Actinic keratosis: Secondary | ICD-10-CM | POA: Diagnosis not present

## 2020-12-22 DIAGNOSIS — D1801 Hemangioma of skin and subcutaneous tissue: Secondary | ICD-10-CM | POA: Diagnosis not present

## 2020-12-22 DIAGNOSIS — L814 Other melanin hyperpigmentation: Secondary | ICD-10-CM | POA: Diagnosis not present

## 2020-12-22 DIAGNOSIS — D692 Other nonthrombocytopenic purpura: Secondary | ICD-10-CM | POA: Diagnosis not present

## 2020-12-23 ENCOUNTER — Encounter: Payer: Self-pay | Admitting: Orthopedic Surgery

## 2020-12-23 DIAGNOSIS — L570 Actinic keratosis: Secondary | ICD-10-CM | POA: Insufficient documentation

## 2020-12-26 ENCOUNTER — Encounter: Payer: Self-pay | Admitting: Orthopedic Surgery

## 2020-12-26 ENCOUNTER — Non-Acute Institutional Stay (SKILLED_NURSING_FACILITY): Payer: Medicare Other | Admitting: Orthopedic Surgery

## 2020-12-26 DIAGNOSIS — K529 Noninfective gastroenteritis and colitis, unspecified: Secondary | ICD-10-CM | POA: Diagnosis not present

## 2020-12-26 DIAGNOSIS — D508 Other iron deficiency anemias: Secondary | ICD-10-CM

## 2020-12-26 DIAGNOSIS — I38 Endocarditis, valve unspecified: Secondary | ICD-10-CM

## 2020-12-26 DIAGNOSIS — I4891 Unspecified atrial fibrillation: Secondary | ICD-10-CM | POA: Diagnosis not present

## 2020-12-26 DIAGNOSIS — F339 Major depressive disorder, recurrent, unspecified: Secondary | ICD-10-CM | POA: Diagnosis not present

## 2020-12-26 DIAGNOSIS — E782 Mixed hyperlipidemia: Secondary | ICD-10-CM

## 2020-12-26 DIAGNOSIS — R531 Weakness: Secondary | ICD-10-CM | POA: Diagnosis not present

## 2020-12-26 DIAGNOSIS — G63 Polyneuropathy in diseases classified elsewhere: Secondary | ICD-10-CM
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
## 2021-01-18 ENCOUNTER — Encounter: Payer: Self-pay | Admitting: Nurse Practitioner

## 2021-01-18 ENCOUNTER — Non-Acute Institutional Stay (SKILLED_NURSING_FACILITY): Payer: Medicare Other | Admitting: Nurse Practitioner

## 2021-01-18 DIAGNOSIS — E782 Mixed hyperlipidemia: Secondary | ICD-10-CM | POA: Diagnosis not present

## 2021-01-18 DIAGNOSIS — I38 Endocarditis, valve unspecified: Secondary | ICD-10-CM | POA: Diagnosis not present

## 2021-01-18 DIAGNOSIS — R627 Adult failure to thrive: Secondary | ICD-10-CM | POA: Diagnosis not present

## 2021-01-18 DIAGNOSIS — F339 Major depressive disorder, recurrent, unspecified: Secondary | ICD-10-CM

## 2021-01-18 DIAGNOSIS — E871 Hypo-osmolality and hyponatremia: Secondary | ICD-10-CM

## 2021-01-18 DIAGNOSIS — G63 Polyneuropathy in diseases classified elsewhere: Secondary | ICD-10-CM | POA: Diagnosis not present

## 2021-01-18 DIAGNOSIS — K529 Noninfective gastroenteritis and colitis, unspecified: Secondary | ICD-10-CM | POA: Diagnosis not present

## 2021-01-18 DIAGNOSIS — R350 Frequency of micturition: Secondary | ICD-10-CM | POA: Diagnosis not present

## 2021-01-18 DIAGNOSIS — K219 Gastro-esophageal reflux disease without esophagitis: Secondary | ICD-10-CM

## 2021-01-18 DIAGNOSIS — D508 Other iron deficiency anemias: Secondary | ICD-10-CM | POA: Diagnosis not present

## 2021-01-18 DIAGNOSIS — F0150 Vascular dementia without behavioral disturbance: Secondary | ICD-10-CM | POA: Diagnosis not present

## 2021-01-18 DIAGNOSIS — I4891 Unspecified atrial fibrillation: Secondary | ICD-10-CM | POA: Diagnosis not present

## 2021-01-18 NOTE — Assessment & Plan Note (Signed)
Off Iron, last Hgb 13.3 10/24/20

## 2021-01-18 NOTE — Assessment & Plan Note (Signed)
Chronic diarrhea, stable presently, recent C-diff, fullytreated, takes Colestipol, f/u GI, last BM yesterday.

## 2021-01-18 NOTE — Assessment & Plan Note (Signed)
GERD/Hx of GI bleed, takes Pantoprazole, Famotdine.

## 2021-01-18 NOTE — Assessment & Plan Note (Signed)
stable, takes Mirtazapine.TSH 1.54 10/24/20

## 2021-01-18 NOTE — Assessment & Plan Note (Signed)
alvular heart disease, stable.Moderate AS, MR, TR- severe LAE, normal LVF-Oct 2019
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
## 2021-01-18 NOTE — Assessment & Plan Note (Signed)
takes Gabapentin, Keppra, Tylenol.

## 2021-01-18 NOTE — Assessment & Plan Note (Signed)
heart rate is in control, takes Amiodarone, not anticoagulated due to hx of GI Bleed.

## 2021-01-18 NOTE — Assessment & Plan Note (Signed)
Urinary frequency, chronic, c/o lower abd discomfort, slightly nauseated, decreased appetite x 2 days, afebrile, denied blood in urine or urinary urgency.  Will obtain UA C/S, CBC/diff, CMP/eGFR

## 2021-01-18 NOTE — Assessment & Plan Note (Signed)
low body weight, but stable, on Mirtazapine.don't thinks DEXA is needed presently.

## 2021-01-18 NOTE — Assessment & Plan Note (Signed)
Na 132 10/24/20, Bun/creat 20/0.56 10/24/20

## 2021-01-18 NOTE — Assessment & Plan Note (Signed)
No behavioral issues, resides in SNF [facility] for supportive care.

## 2021-01-18 NOTE — Assessment & Plan Note (Signed)
Controlled, LDL 66

## 2021-01-19 DIAGNOSIS — F0390 Unspecified dementia without behavioral disturbance: Secondary | ICD-10-CM | POA: Diagnosis not present

## 2021-01-19 DIAGNOSIS — D649 Anemia, unspecified: Secondary | ICD-10-CM | POA: Diagnosis not present

## 2021-01-19 DIAGNOSIS — L821 Other seborrheic keratosis: Secondary | ICD-10-CM | POA: Diagnosis not present

## 2021-01-19 DIAGNOSIS — Z85828 Personal history of other malignant neoplasm of skin: Secondary | ICD-10-CM | POA: Diagnosis not present

## 2021-01-19 DIAGNOSIS — L570 Actinic keratosis: Secondary | ICD-10-CM | POA: Diagnosis not present

## 2021-01-19 DIAGNOSIS — L814 Other melanin hyperpigmentation: Secondary | ICD-10-CM | POA: Diagnosis not present

## 2021-01-19 DIAGNOSIS — N390 Urinary tract infection, site not specified: Secondary | ICD-10-CM | POA: Diagnosis not present

## 2021-02-15 DIAGNOSIS — Z7901 Long term (current) use of anticoagulants: Secondary | ICD-10-CM | POA: Diagnosis not present

## 2021-02-15 DIAGNOSIS — R41841 Cognitive communication deficit: Secondary | ICD-10-CM | POA: Diagnosis not present

## 2021-02-15 DIAGNOSIS — I480 Paroxysmal atrial fibrillation: Secondary | ICD-10-CM | POA: Diagnosis not present

## 2021-02-15 DIAGNOSIS — G629 Polyneuropathy, unspecified: Secondary | ICD-10-CM | POA: Diagnosis not present

## 2021-02-15 DIAGNOSIS — I10 Essential (primary) hypertension: Secondary | ICD-10-CM | POA: Diagnosis not present

## 2021-02-15 DIAGNOSIS — M6281 Muscle weakness (generalized): Secondary | ICD-10-CM | POA: Diagnosis not present

## 2021-02-15 DIAGNOSIS — I341 Nonrheumatic mitral (valve) prolapse: Secondary | ICD-10-CM | POA: Diagnosis not present

## 2021-02-15 DIAGNOSIS — R2681 Unsteadiness on feet: Secondary | ICD-10-CM | POA: Diagnosis not present

## 2021-02-15 DIAGNOSIS — I2510 Atherosclerotic heart disease of native coronary artery without angina pectoris: Secondary | ICD-10-CM | POA: Diagnosis not present

## 2021-02-15 DIAGNOSIS — I5032 Chronic diastolic (congestive) heart failure: Secondary | ICD-10-CM | POA: Diagnosis not present

## 2021-02-15 DIAGNOSIS — Z96651 Presence of right artificial knee joint: Secondary | ICD-10-CM | POA: Diagnosis not present

## 2021-02-15 DIAGNOSIS — G9009 Other idiopathic peripheral autonomic neuropathy: Secondary | ICD-10-CM | POA: Diagnosis not present

## 2021-02-15 DIAGNOSIS — R2689 Other abnormalities of gait and mobility: Secondary | ICD-10-CM | POA: Diagnosis not present

## 2021-02-15 DIAGNOSIS — K219 Gastro-esophageal reflux disease without esophagitis: Secondary | ICD-10-CM | POA: Diagnosis not present

## 2021-02-15 DIAGNOSIS — I272 Pulmonary hypertension, unspecified: Secondary | ICD-10-CM | POA: Diagnosis not present

## 2021-02-15 DIAGNOSIS — E2740 Unspecified adrenocortical insufficiency: Secondary | ICD-10-CM | POA: Diagnosis not present

## 2021-02-15 DIAGNOSIS — M1990 Unspecified osteoarthritis, unspecified site: Secondary | ICD-10-CM | POA: Diagnosis not present

## 2021-02-15 DIAGNOSIS — E785 Hyperlipidemia, unspecified: Secondary | ICD-10-CM | POA: Diagnosis not present

## 2021-02-15 DIAGNOSIS — I252 Old myocardial infarction: Secondary | ICD-10-CM | POA: Diagnosis not present

## 2021-02-15 DIAGNOSIS — Z9181 History of falling: Secondary | ICD-10-CM | POA: Diagnosis not present

## 2021-02-17 DIAGNOSIS — M79672 Pain in left foot: Secondary | ICD-10-CM | POA: Diagnosis not present

## 2021-02-17 DIAGNOSIS — R29898 Other symptoms and signs involving the musculoskeletal system: Secondary | ICD-10-CM | POA: Diagnosis not present

## 2021-02-17 DIAGNOSIS — R2681 Unsteadiness on feet: Secondary | ICD-10-CM | POA: Diagnosis not present

## 2021-02-17 DIAGNOSIS — M6281 Muscle weakness (generalized): Secondary | ICD-10-CM | POA: Diagnosis not present

## 2021-02-17 DIAGNOSIS — G629 Polyneuropathy, unspecified: Secondary | ICD-10-CM | POA: Diagnosis not present

## 2021-02-17 DIAGNOSIS — Z9181 History of falling: Secondary | ICD-10-CM | POA: Diagnosis not present

## 2021-02-17 DIAGNOSIS — M79671 Pain in right foot: Secondary | ICD-10-CM | POA: Diagnosis not present

## 2021-02-17 DIAGNOSIS — B351 Tinea unguium: Secondary | ICD-10-CM | POA: Diagnosis not present

## 2021-02-21 DIAGNOSIS — R2681 Unsteadiness on feet: Secondary | ICD-10-CM | POA: Diagnosis not present

## 2021-02-21 DIAGNOSIS — R29898 Other symptoms and signs involving the musculoskeletal system: Secondary | ICD-10-CM | POA: Diagnosis not present

## 2021-02-21 DIAGNOSIS — G629 Polyneuropathy, unspecified: Secondary | ICD-10-CM | POA: Diagnosis not present

## 2021-02-21 DIAGNOSIS — M6281 Muscle weakness (generalized): Secondary | ICD-10-CM | POA: Diagnosis not present

## 2021-02-21 DIAGNOSIS — Z9181 History of falling: Secondary | ICD-10-CM | POA: Diagnosis not present

## 2021-02-23 DIAGNOSIS — R29898 Other symptoms and signs involving the musculoskeletal system: Secondary | ICD-10-CM | POA: Diagnosis not present

## 2021-02-23 DIAGNOSIS — L570 Actinic keratosis: Secondary | ICD-10-CM | POA: Diagnosis not present

## 2021-02-23 DIAGNOSIS — M6281 Muscle weakness (generalized): Secondary | ICD-10-CM | POA: Diagnosis not present

## 2021-02-23 DIAGNOSIS — D692 Other nonthrombocytopenic purpura: Secondary | ICD-10-CM | POA: Diagnosis not present

## 2021-02-23 DIAGNOSIS — R2681 Unsteadiness on feet: Secondary | ICD-10-CM | POA: Diagnosis not present

## 2021-02-23 DIAGNOSIS — L814 Other melanin hyperpigmentation: Secondary | ICD-10-CM | POA: Diagnosis not present

## 2021-02-23 DIAGNOSIS — Z9181 History of falling: Secondary | ICD-10-CM | POA: Diagnosis not present

## 2021-02-23 DIAGNOSIS — G629 Polyneuropathy, unspecified: Secondary | ICD-10-CM | POA: Diagnosis not present

## 2021-02-23 DIAGNOSIS — Z85828 Personal history of other malignant neoplasm of skin: Secondary | ICD-10-CM | POA: Diagnosis not present

## 2021-02-24 DIAGNOSIS — G629 Polyneuropathy, unspecified: Secondary | ICD-10-CM | POA: Diagnosis not present

## 2021-02-24 DIAGNOSIS — Z9181 History of falling: Secondary | ICD-10-CM | POA: Diagnosis not present

## 2021-02-24 DIAGNOSIS — R29898 Other symptoms and signs involving the musculoskeletal system: Secondary | ICD-10-CM | POA: Diagnosis not present

## 2021-02-24 DIAGNOSIS — M6281 Muscle weakness (generalized): Secondary | ICD-10-CM | POA: Diagnosis not present

## 2021-02-24 DIAGNOSIS — R2681 Unsteadiness on feet: Secondary | ICD-10-CM | POA: Diagnosis not present

## 2021-02-28 DIAGNOSIS — R2681 Unsteadiness on feet: Secondary | ICD-10-CM | POA: Diagnosis not present

## 2021-02-28 DIAGNOSIS — R29898 Other symptoms and signs involving the musculoskeletal system: Secondary | ICD-10-CM | POA: Diagnosis not present

## 2021-02-28 DIAGNOSIS — Z9181 History of falling: Secondary | ICD-10-CM | POA: Diagnosis not present

## 2021-02-28 DIAGNOSIS — M6281 Muscle weakness (generalized): Secondary | ICD-10-CM | POA: Diagnosis not present

## 2021-02-28 DIAGNOSIS — G629 Polyneuropathy, unspecified: Secondary | ICD-10-CM | POA: Diagnosis not present

## 2021-03-01 DIAGNOSIS — R29898 Other symptoms and signs involving the musculoskeletal system: Secondary | ICD-10-CM | POA: Diagnosis not present

## 2021-03-01 DIAGNOSIS — R2681 Unsteadiness on feet: Secondary | ICD-10-CM | POA: Diagnosis not present

## 2021-03-01 DIAGNOSIS — M6281 Muscle weakness (generalized): Secondary | ICD-10-CM | POA: Diagnosis not present

## 2021-03-01 DIAGNOSIS — Z9181 History of falling: Secondary | ICD-10-CM | POA: Diagnosis not present

## 2021-03-01 DIAGNOSIS — G629 Polyneuropathy, unspecified: Secondary | ICD-10-CM | POA: Diagnosis not present

## 2021-03-02 DIAGNOSIS — R2681 Unsteadiness on feet: Secondary | ICD-10-CM | POA: Diagnosis not present

## 2021-03-02 DIAGNOSIS — R29898 Other symptoms and signs involving the musculoskeletal system: Secondary | ICD-10-CM | POA: Diagnosis not present

## 2021-03-02 DIAGNOSIS — Z9181 History of falling: Secondary | ICD-10-CM | POA: Diagnosis not present

## 2021-03-02 DIAGNOSIS — G629 Polyneuropathy, unspecified: Secondary | ICD-10-CM | POA: Diagnosis not present

## 2021-03-02 DIAGNOSIS — M6281 Muscle weakness (generalized): Secondary | ICD-10-CM | POA: Diagnosis not present

## 2021-03-03 DIAGNOSIS — G629 Polyneuropathy, unspecified: Secondary | ICD-10-CM | POA: Diagnosis not present

## 2021-03-03 DIAGNOSIS — R2681 Unsteadiness on feet: Secondary | ICD-10-CM | POA: Diagnosis not present

## 2021-03-03 DIAGNOSIS — R29898 Other symptoms and signs involving the musculoskeletal system: Secondary | ICD-10-CM | POA: Diagnosis not present

## 2021-03-03 DIAGNOSIS — M6281 Muscle weakness (generalized): Secondary | ICD-10-CM | POA: Diagnosis not present

## 2021-03-03 DIAGNOSIS — Z9181 History of falling: Secondary | ICD-10-CM | POA: Diagnosis not present

## 2021-03-07 DIAGNOSIS — Z9181 History of falling: Secondary | ICD-10-CM | POA: Diagnosis not present

## 2021-03-07 DIAGNOSIS — R2681 Unsteadiness on feet: Secondary | ICD-10-CM | POA: Diagnosis not present

## 2021-03-07 DIAGNOSIS — R29898 Other symptoms and signs involving the musculoskeletal system: Secondary | ICD-10-CM | POA: Diagnosis not present

## 2021-03-07 DIAGNOSIS — M6281 Muscle weakness (generalized): Secondary | ICD-10-CM | POA: Diagnosis not present

## 2021-03-07 DIAGNOSIS — G629 Polyneuropathy, unspecified: Secondary | ICD-10-CM | POA: Diagnosis not present

## 2021-03-08 ENCOUNTER — Non-Acute Institutional Stay (INDEPENDENT_AMBULATORY_CARE_PROVIDER_SITE_OTHER): Payer: Medicare Other | Admitting: Orthopedic Surgery

## 2021-03-08 ENCOUNTER — Encounter: Payer: Self-pay | Admitting: Orthopedic Surgery

## 2021-03-08 DIAGNOSIS — Z0000 Encounter for general adult medical examination without abnormal findings: Secondary | ICD-10-CM

## 2021-03-08 NOTE — Patient Instructions (Signed)
  [name] , Thank you for taking time to come for your Medicare Wellness Visit. I appreciate your ongoing commitment to your health goals. Please review the following plan we discussed and let me know if I can assist you in the future.   These are the goals we discussed:  Goals      DIET - INCREASE WATER INTAKE        This is a list of the screening recommended for you and due dates:  Health Maintenance  Topic Date Due   Tetanus Vaccine  Never done   DEXA scan (bone density measurement)  Never done   Pneumonia vaccines (1 of 2 - PCV13) Never done   COVID-19 Vaccine (4 - Booster for Moderna series) 10/04/2020   Flu Shot  03/20/2021   Zoster (Shingles) Vaccine  Completed   HPV Vaccine  Aged Out

## 2021-03-08 NOTE — Progress Notes (Signed)
Subjective:   [name] is a 87 y.o. female who presents for Medicare Annual (Subsequent) preventive examination.  Review of Systems     Cardiac Risk Factors include: advanced age (>65men, >68 women);sedentary lifestyle;hypertension;dyslipidemia     Objective:    Today's Vitals   03/08/21 1603  BP: (!) 141/67  Pulse: 60  Resp: 20  Temp: (!) 97.5 F (36.4 C)  SpO2: 94%  Weight: 92 lb 9.6 oz (42 kg)  Height: 5\' 2"  (1.575 m)   Body mass index is 16.94 kg/m.  Advanced Directives 03/08/2021 01/18/2021 12/01/2020 10/25/2020 07/22/2020 07/22/2020 06/24/2020  Does Patient Have a Medical Advance Directive? Yes Yes Yes Yes - Yes Yes  Type of Advance Directive [place];Living will [place];Living will;Out of facility DNR (pink MOST or yellow form) [place];Living will;Out of facility DNR (pink MOST or yellow form) [place];Living will;Out of facility DNR (pink MOST or yellow form) Healthcare Power of [employer] will [place];Living will;Out of facility DNR (pink MOST or yellow form)  Does patient want to make changes to medical advance directive? No - Patient declined No - Patient declined - No - Patient declined No - Patient declined No - Patient declined No - Patient declined  Copy of [place] in Chart? Yes - validated most recent copy scanned in chart (See row information) Yes - validated most recent copy scanned in chart (See row information) Yes - validated most recent copy scanned in chart (See row information) Yes - validated most recent copy scanned in chart (See row information) Yes - validated most recent copy scanned in chart (See row information) - Yes - validated most recent copy scanned in chart (See row information)  Would patient like information on creating a medical advance directive? - - - - - - -  Pre-existing out of facility DNR order (yellow form or  pink MOST form) - Pink MOST form placed in chart (order not valid for inpatient use);Yellow form placed in chart (order not valid for inpatient use) Pink MOST form placed in chart (order not valid for inpatient use);Yellow form placed in chart (order not valid for inpatient use) Pink MOST form placed in chart (order not valid for inpatient use);Yellow form placed in chart (order not valid for inpatient use) - Pink MOST form placed in chart (order not valid for inpatient use) Pink MOST form placed in chart (order not valid for inpatient use)    Current Medications (verified) Outpatient Encounter Medications as of 03/08/2021  Medication Sig   acetaminophen (TYLENOL) 500 MG tablet Take 500 mg by mouth 4 (four) times daily.   amiodarone (PACERONE) 100 MG tablet Take 1 tablet (100 mg total) by mouth every other day.   calcium carbonate (OS-CAL) 1250 (500 Ca) MG chewable tablet Chew 1 tablet by mouth daily.   Camphor-Menthol-Methyl Sal (BEN GAY ULTRA STRENGTH) 11-27-28 % CREA Apply 1 application topically 4 (four) times daily as needed. Apply to right hip and bilateral knees   cetirizine (ZYRTEC) 10 MG tablet Take 10 mg by mouth daily with breakfast.    cholecalciferol (VITAMIN D) 1000 UNITS tablet Take 2,000 Units by mouth daily.   colestipol (COLESTID) 1 g tablet Take 1 g by mouth 2 (two) times daily.   Cyanocobalamin (B-12) 1000 MCG TABS Take 1,000 mcg by mouth daily.    cycloSPORINE (RESTASIS) 0.05 % ophthalmic emulsion Place 1 drop into both eyes 2 (two) times  daily.   dextromethorphan-guaiFENesin (MUCINEX DM) 30-600 MG 12hr tablet Take 1 tablet by mouth 2 (two) times daily.   famotidine (PEPCID) 20 MG tablet Take 20 mg by mouth daily.   fluticasone (FLONASE) 50 MCG/ACT nasal spray Place 1 spray into both nostrils daily.    gabapentin (NEURONTIN) 100 MG capsule Take 300 mg by mouth 3 (three) times daily. Take 3 to = 300 mg TID   gabapentin (NEURONTIN) 300 MG capsule Take 900 mg by mouth daily.    HYDROcodone-acetaminophen (NORCO/VICODIN) 5-325 MG tablet Take 1 tablet by mouth every 6 (six) hours as needed for moderate pain.   levETIRAcetam (KEPPRA) 250 MG tablet Take 125 mg by mouth every morning.   levETIRAcetam (KEPPRA) 250 MG tablet Take 500 mg by mouth daily.   loperamide (IMODIUM A-D) 2 MG tablet Take 2 mg by mouth 4 (four) times daily as needed for diarrhea or loose stools.   mirtazapine (REMERON) 7.5 MG tablet Take 7.5 mg by mouth at bedtime.   Multiple Vitamins-Minerals (MULTIVITAMIN WITH MINERALS) tablet Take 1 tablet by mouth daily.   ondansetron (ZOFRAN) 4 MG tablet Take 1 tablet (4 mg total) by mouth every 8 (eight) hours as needed for up to 15 doses for nausea or vomiting.   pantoprazole (PROTONIX) 40 MG tablet Take 40 mg by mouth at bedtime.    Soft Lens Products (REWETTING DROPS) SOLN by Does not apply route every 6 (six) hours as needed.   [DISCONTINUED] Lactulose 20 GM/30ML SOLN Take 30 mLs by mouth daily.   No facility-administered encounter medications on file as of 03/08/2021.    Allergies (verified) Avelox [moxifloxacin], Aricept [donepezil hcl], Codeine, Donepezil, Garlic, Latex, Onion, Sulfa drugs cross reactors, Cymbalta [duloxetine hcl], Dust mite extract, Lactose intolerance (gi), Lidoderm [lidocaine], Molds & smuts, Duloxetine, and Polysporin [bacitracin-polymyxin b]   History: Past Medical History:  Diagnosis Date   Abnormality of gait 09/07/2015   Anemia, iron deficiency 09/16/2014   Anxiety    Arthritis    "hands and feet" (12/06/2017)   CAD (coronary artery disease)    a. 08/2014 NSTEMI/Cath: mild Ca2+ or RCA ostium, otw nl cors. CO 3.3 L/min (thermo), 3.7 L/min (Fick).   Carotid arterial disease ([place]) 07/30/2012   carotid doppler; normal study   CHF (congestive heart failure) (HCC)    Chronic anticoagulation, with coumadin, with PAF and CHADS2Vasc2 score of 4 09/16/2014   Chronic back pain    "all over" (6/96/2952)   Complication of anesthesia     "they had hard time waking me up" (12/06/2017)   Degenerative arthritis    Diverticulosis    Essential hypertension    GERD (gastroesophageal reflux disease)    History of blood transfusion 12/06/2017   History of hiatal hernia    History of nuclear stress test 09/19/2007   normal pattern of perfusion; post-stress EF 86%; EKG negative for ischemia; low risk scan   Hyperlipidemia    Left carotid bruit    a. 07/2015 Carotid U/S: 1-39% bilat ICA stenosis.   Memory disorder 03/05/2014   Mild renal insufficiency    Mitral prolapse    Neuropathy    "hands and feet" (12/06/2017)   PAF (paroxysmal atrial fibrillation) ([place])    a. 08/2014-->Coumadin (CHA2DS2VASc = 4).   Peripheral neuropathy    Small fiber    Pre-syncope    a. 04/2015 in setting of bradycardia-->CCB/BB doses adjusted.   Pulmonary hypertension (HCC)    Seasonal allergies    Skin cancer    "burned off  my face" (12/06/2017)   Valvular heart disease    a. 04/2015 Echo: EF 65-70%, no rwma, Gr1 DD, mild AS, triv AI, mild MS, mod MR, mod dil LA, mod TR, sev increased PASP.   Past Surgical History:  Procedure Laterality Date   ABDOMINAL HYSTERECTOMY     APPENDECTOMY     BUNIONECTOMY Bilateral    CARPAL TUNNEL RELEASE Left    CHOLECYSTECTOMY OPEN     DILATION AND CURETTAGE OF UTERUS     ESOPHAGOGASTRODUODENOSCOPY N/A 12/10/2017   Procedure: ESOPHAGOGASTRODUODENOSCOPY (EGD);  Surgeon: Clarene Essex, MD;  Location: [facility];  Service: Endoscopy;  Laterality: N/A;   FERTILITY SURGERY     resuspension procedure    FOOT FRACTURE SURGERY Left    FRACTURE SURGERY     HIP ARTHROPLASTY Left 05/02/2019   Procedure: ARTHROPLASTY BIPOLAR HIP (HEMIARTHROPLASTY);  Surgeon: Marybelle Killings, MD;  Location: [facility];  Service: Orthopedics;  Laterality: Left;   JOINT REPLACEMENT     LEFT HEART CATHETERIZATION WITH CORONARY ANGIOGRAM N/A 09/14/2014   Procedure: LEFT HEART CATHETERIZATION WITH CORONARY ANGIOGRAM;  Surgeon: Troy Sine, MD;   [facility] C[facility] LAB;  Service: Cardiovascular;  Laterality: N/A;   REPLACEMENT TOTAL KNEE Right 03/2008   Archie Endo 12/19/2010   ROBOTIC ASSISTED BILATERAL SALPINGO OOPHERECTOMY Bilateral 02/19/2017   Procedure: XI ROBOTIC ASSISTED BILATERAL SALPINGO OOPHORECTOMY AND LYSIS OF ADHESION;  Surgeon: Everitt Amber, MD;  Location: [facility];  Service: Gynecology;  Laterality: Bilateral;   TEAR DUCT PROBING Left    TONSILLECTOMY     UMBILICAL HERNIA REPAIR     Family History  Problem Relation Age of Onset   Pneumonia Mother    Coronary artery disease Mother    Hypertension Mother    Heart disease Father    Cancer Father    Hypertension Father    Arthritis Sister    Social History   Socioeconomic History   Marital status: Widowed    Spouse name: Not on file   Number of children: 2   Years of education: AS   Highest education level: Not on file  Occupational History   Occupation: Retired  Tobacco Use   Smoking status: Former    Types: Cigarettes   Smokeless tobacco: Never   Tobacco comments:    "quit smoking ~ 1948  Vaping Use   Vaping Use: Never used  Substance and Sexual Activity   Alcohol use: No   Drug use: No   Sexual activity: Not Currently  Other Topics Concern   Not on file  Social History Narrative   [facility]   Patient is left handed, but uses right handed.   Patient drinks one cup caffeine daily.   Social Determinants of Health   Financial Resource Strain: Low Risk    Difficulty of Paying Living Expenses: Not hard at all  Food Insecurity: No Food Insecurity   Worried About Charity fundraiser in the Last Year: Never true   [place] in the Last Year: Never true  Transportation Needs: No Transportation Needs   Lack of Transportation (Medical): No   Lack of Transportation (Non-Medical): No  Physical Activity: Insufficiently Active   Days of Exercise per Week: 4 days   Minutes of Exercise per Session: 20 min  Stress: No Stress Concern Present    Feeling of Stress : Not at all  Social Connections: Socially Isolated   Frequency of Communication with Friends and Family: Three times a week  Frequency of Social Gatherings with Friends and Family: Once a week   Attends Religious Services: Never   Marine scientist or Organizations: No   Attends Archivist Meetings: Never   Marital Status: Widowed    Tobacco Counseling Counseling given: Not Answered Tobacco comments: "quit smoking ~ 1948   Clinical Intake:  Pre-visit preparation completed: Yes  Pain : No/denies pain BMI - recorded: 16.94 Nutritional Status: BMI <19  Underweight Nutritional Risks: Unintentional weight loss Diabetes: No  How often do you need to have someone help you when you read instructions, pamphlets, or other written materials from your doctor or pharmacy?: 5 - Always What is the last grade level you completed in school?: Junior college  Diabetic?No  Interpreter Needed?: No      Activities of Daily Living In your present state of health, do you have any difficulty performing the following activities: 03/08/2021  Hearing? N  Vision? Y  Difficulty concentrating or making decisions? Y  Walking or climbing stairs? Y  Dressing or bathing? Y  Doing errands, shopping? Y  Preparing Food and eating ? Y  Using the Toilet? N  In the past six months, have you accidently leaked urine? Y  Do you have problems with loss of bowel control? Y  Managing your Medications? Y  Managing your Finances? Y  Housekeeping or managing your Housekeeping? Y  Some recent data might be hidden    Patient Care Team: Virgie Dad, MD as PCP - General (Internal Medicine) Troy Sine, MD as PCP - Cardiology (Cardiology) Troy Sine, MD as Consulting Physician (Cardiology)  Indicate any recent Medical Services you may have received from other than [facility] providers in the past year (date may be approximate).     Assessment:   This is a routine  wellness examination for [place].  Hearing/Vision screen No results found.  Dietary issues and exercise activities discussed: Current Exercise Habits: The patient does not participate in regular exercise at present, Exercise limited by: neurologic condition(s);orthopedic condition(s) (vascular dementia)   Goals Addressed             This Visit's Progress    DIET - INCREASE WATER INTAKE   Not on track      Depression Screen PHQ 2/9 Scores 03/08/2021 01/27/2016  PHQ - 2 Score 0 0    Fall Risk Fall Risk  03/08/2021 04/17/2018 01/27/2016  Falls in the past year? 0 Yes No  Number falls in past yr: 0 1 -  Injury with Fall? 0 Yes -  Risk for fall due to : History of fall(s);Impaired balance/gait;Impaired mobility Impaired balance/gait -  Follow up Falls evaluation completed;Education provided;Falls prevention discussed - -    FALL RISK PREVENTION PERTAINING TO THE HOME:  Any stairs in or around the home? No  If so, are there any without handrails? No  Home free of loose throw rugs in walkways, pet beds, electrical cords, etc? Yes  Adequate lighting in your home to reduce risk of falls? Yes   ASSISTIVE DEVICES UTILIZED TO PREVENT FALLS:  Life alert? No  Use of a cane, walker or w/c? Yes  Grab bars in the bathroom? Yes  Shower chair or bench in shower? Yes  Elevated toilet seat or a handicapped toilet? Yes   TIMED UP AND GO:  Was the test performed? No .  Length of time to ambulate 10 feet: 0 sec.   Gait slow and steady with assistive device  Cognitive Function: MMSE -  Mini Mental State Exam 03/08/2021 01/26/2019 04/17/2018 10/09/2017 04/03/2017  Not completed: Unable to complete - - - -  Orientation to time - 5 4 5 4   Orientation to Place - 5 5 5 5   Registration - 3 3 3 3   Attention/ Calculation - 5 5 5 5   Recall - 2 3 3 3   Language- name 2 objects - 2 2 2 2   Language- repeat - 1 1 1 1   Language- follow 3 step command - 3 3 3 3   Language- read & follow direction - 1 1 1 1    Write a sentence - 1 1 1 1   Copy design - 0 1 1 1   Total score - 28 29 30 29      6CIT Screen 03/08/2021  What Year? 0 points  What month? 0 points  What time? 3 points  Count back from 20 4 points  Months in reverse 4 points  Repeat phrase 4 points  Total Score 15    Immunizations Immunization History  Administered Date(s) Administered   Influenza, High Dose Seasonal PF 06/13/2017   Influenza-Unspecified 06/20/2018, 05/11/2020   Moderna Sars-Covid-2 Vaccination 08/24/2019, 09/21/2019, 07/04/2020   Zoster Recombinat (Shingrix) 02/13/2018, 06/05/2018    TDAP status: Due, Education has been provided regarding the importance of this vaccine. Advised may receive this vaccine at local pharmacy or Health Dept. Aware to provide a copy of the vaccination record if obtained from local pharmacy or Health Dept. Verbalized acceptance and understanding.  Flu Vaccine status: Up to date  Pneumococcal vaccine status: Up to date  Covid-19 vaccine status: Completed vaccines  Qualifies for Shingles Vaccine? Yes   Zostavax completed Yes   Shingrix Completed?: No.    Education has been provided regarding the importance of this vaccine. Patient has been advised to call insurance company to determine out of pocket expense if they have not yet received this vaccine. Advised may also receive vaccine at local pharmacy or Health Dept. Verbalized acceptance and understanding.  Screening Tests Health Maintenance  Topic Date Due   TETANUS/TDAP  Never done   DEXA SCAN  Never done   PNA vac Low Risk Adult (1 of 2 - PCV13) Never done   COVID-19 Vaccine (4 - Booster for Moderna series) 10/04/2020   INFLUENZA VACCINE  03/20/2021   Zoster Vaccines- Shingrix  Completed   HPV VACCINES  Aged Out    Health Maintenance  Health Maintenance Due  Topic Date Due   TETANUS/TDAP  Never done   DEXA SCAN  Never done   PNA vac Low Risk Adult (1 of 2 - PCV13) Never done   COVID-19 Vaccine (4 - Booster for  Moderna series) 10/04/2020    Colorectal cancer screening: No longer required.   Mammogram status: No longer required due to advanced age.  Bone Density status: Ordered Not ordered due to advanced age. Pt provided with contact info and advised to call to schedule appt.  Lung Cancer Screening: (Low Dose CT Chest recommended if Age 50-80 years, 30 pack-year currently smoking OR have quit w/in 15years.) does not qualify.   Lung Cancer Screening Referral: No  Additional Screening:  Hepatitis C Screening: does not qualify; Completed No  Vision Screening: Recommended annual ophthalmology exams for early detection of glaucoma and other disorders of the eye. Is the patient up to date with their annual eye exam?  No  Who is the provider or what is the name of the office in which the patient attends annual eye exams? N/A  If pt is not established with a provider, would they like to be referred to a provider to establish care? No .   Dental Screening: Recommended annual dental exams for proper oral hygiene  Community Resource Referral / Chronic Care Management: CRR required this visit?  No   CCM required this visit?  No      Plan:     I have personally reviewed and noted the following in the patient's chart:   Medical and social history Use of alcohol, tobacco or illicit drugs  Current medications and supplements including opioid prescriptions.  Functional ability and status Nutritional status Physical activity Advanced directives List of other physicians Hospitalizations, surgeries, and ER visits in previous 12 months Vitals Screenings to include cognitive, depression, and falls Referrals and appointments  In addition, I have reviewed and discussed with patient certain preventive protocols, quality metrics, and best practice recommendations. A written personalized care plan for preventive services as well as general preventive health recommendations were provided to patient.      Yvonna Alanis, NP   03/08/2021

## 2021-03-08 NOTE — Progress Notes (Signed)
Provider:  Windell Moulding, NP Location:  [place]   Place of Service:      PCP: Virgie Dad, MD Patient Care Team: Virgie Dad, MD as PCP - General (Internal Medicine) Troy Sine, MD as PCP - Cardiology (Cardiology) Troy Sine, MD as Consulting Physician (Cardiology)  Extended Emergency Contact Information Primary Emergency Contact: Baugh,Bill Address: [address]          [place], [place] [address] Johnnette Litter of [place] Phone: [phone] Relation: Son Secondary Emergency Contact: [place] Mobile Phone: [phone] Relation: Daughter  Code Status: DNR Goals of Care: Advanced Directive information Advanced Directives 03/08/2021  Does Patient Have a Medical Advance Directive? Yes  Type of Paramedic of [place];Living will  Does patient want to make changes to medical advance directive? No - Patient declined  Copy of [place] in Chart? Yes - validated most recent copy scanned in chart (See row information)  Would patient like information on creating a medical advance directive? -  Pre-existing out of facility DNR order (yellow form or pink MOST form) -     Chief Complaint  Patient presents with   Annual Exam    Annual wellness exam    HPI: Patient is a 87 y.o. female seen today for an annual comprehensive examination.  Past Medical History:  Diagnosis Date   Abnormality of gait 09/07/2015   Anemia, iron deficiency 09/16/2014   Anxiety    Arthritis    "hands and feet" (12/06/2017)   CAD (coronary artery disease)    a. 08/2014 NSTEMI/Cath: mild Ca2+ or RCA ostium, otw nl cors. CO 3.3 L/min (thermo), 3.7 L/min (Fick).   Carotid arterial disease ([place]) 07/30/2012   carotid doppler; normal study   CHF (congestive heart failure) (HCC)    Chronic anticoagulation, with coumadin, with PAF and CHADS2Vasc2 score of 4 09/16/2014   Chronic back pain    "all over" (7/41/2878)   Complication of  anesthesia    "they had hard time waking me up" (12/06/2017)   Degenerative arthritis    Diverticulosis    Essential hypertension    GERD (gastroesophageal reflux disease)    History of blood transfusion 12/06/2017   History of hiatal hernia    History of nuclear stress test 09/19/2007   normal pattern of perfusion; post-stress EF 86%; EKG negative for ischemia; low risk scan   Hyperlipidemia    Left carotid bruit    a. 07/2015 Carotid U/S: 1-39% bilat ICA stenosis.   Memory disorder 03/05/2014   Mild renal insufficiency    Mitral prolapse    Neuropathy    "hands and feet" (12/06/2017)   PAF (paroxysmal atrial fibrillation) ([facility])    a. 08/2014-->Coumadin (CHA2DS2VASc = 4).   Peripheral neuropathy    Small fiber    Pre-syncope    a. 04/2015 in setting of bradycardia-->CCB/BB doses adjusted.   Pulmonary hypertension (HCC)    Seasonal allergies    Skin cancer    "burned off my face" (12/06/2017)   Valvular heart disease    a. 04/2015 Echo: EF 65-70%, no rwma, Gr1 DD, mild AS, triv AI, mild MS, mod MR, mod dil LA, mod TR, sev increased PASP.   Past Surgical History:  Procedure Laterality Date   ABDOMINAL HYSTERECTOMY     APPENDECTOMY     BUNIONECTOMY Bilateral    CARPAL TUNNEL RELEASE Left    CHOLECYSTECTOMY OPEN     DILATION AND CURETTAGE OF UTERUS  ESOPHAGOGASTRODUODENOSCOPY N/A 12/10/2017   Procedure: ESOPHAGOGASTRODUODENOSCOPY (EGD);  Surgeon: Clarene Essex, MD;  Location: [facility];  Service: Endoscopy;  Laterality: N/A;   FERTILITY SURGERY     resuspension procedure    FOOT FRACTURE SURGERY Left    FRACTURE SURGERY     HIP ARTHROPLASTY Left 05/02/2019   Procedure: ARTHROPLASTY BIPOLAR HIP (HEMIARTHROPLASTY);  Surgeon: Marybelle Killings, MD;  Location: [facility];  Service: Orthopedics;  Laterality: Left;   JOINT REPLACEMENT     LEFT HEART CATHETERIZATION WITH CORONARY ANGIOGRAM N/A 09/14/2014   Procedure: LEFT HEART CATHETERIZATION WITH CORONARY ANGIOGRAM;  Surgeon: Troy Sine, MD;  Location: [facility];  Service: Cardiovascular;  Laterality: N/A;   REPLACEMENT TOTAL KNEE Right 03/2008   Archie Endo 12/19/2010   ROBOTIC ASSISTED BILATERAL SALPINGO OOPHERECTOMY Bilateral 02/19/2017   Procedure: XI ROBOTIC ASSISTED BILATERAL SALPINGO OOPHORECTOMY AND LYSIS OF ADHESION;  Surgeon: Everitt Amber, MD;  Location: [facility];  Service: Gynecology;  Laterality: Bilateral;   TEAR DUCT PROBING Left    TONSILLECTOMY     UMBILICAL HERNIA REPAIR      reports that she has quit smoking. Her smoking use included cigarettes. She has never used smokeless tobacco. She reports that she does not drink alcohol and does not use drugs. Social History   Socioeconomic History   Marital status: Widowed    Spouse name: Not on file   Number of children: 2   Years of education: AS   Highest education level: Not on file  Occupational History   Occupation: Retired  Tobacco Use   Smoking status: Former    Types: Cigarettes   Smokeless tobacco: Never   Tobacco comments:    "quit smoking ~ 1948  Vaping Use   Vaping Use: Never used  Substance and Sexual Activity   Alcohol use: No   Drug use: No   Sexual activity: Not Currently  Other Topics Concern   Not on file  Social History Narrative   Resides at [facility]   Patient is left handed, but uses right handed.   Patient drinks one cup caffeine daily.   Social Determinants of Health   Financial Resource Strain: Not on file  Food Insecurity: Not on file  Transportation Needs: Not on file  Physical Activity: Not on file  Stress: Not on file  Social Connections: Not on file  Intimate Partner Violence: Not on file   Family History  Problem Relation Age of Onset   Pneumonia Mother    Coronary artery disease Mother    Hypertension Mother    Heart disease Father    Cancer Father    Hypertension Father    Arthritis Sister     Pertinent  Health Maintenance Due  Topic Date Due   DEXA SCAN  Never done   PNA vac Low Risk Adult  (1 of 2 - PCV13) Never done   INFLUENZA VACCINE  03/20/2021   Fall Risk  04/17/2018 01/27/2016  Falls in the past year? Yes No  Number falls in past yr: 1 -  Injury with Fall? Yes -  Risk for fall due to : Impaired balance/gait -   Depression screen PHQ 2/9 01/27/2016  Decreased Interest 0  Down, Depressed, Hopeless 0  PHQ - 2 Score 0  Some recent data might be hidden    Functional Status Survey:    Allergies  Allergen Reactions   Avelox [Moxifloxacin] Shortness Of Breath and Swelling   Aricept [Donepezil Hcl] Diarrhea   Codeine Swelling  Donepezil Diarrhea   Garlic Diarrhea    severe   Latex Hives, Itching, Rash and Other (See Comments)    Watery blisters    Onion Diarrhea    severe   Sulfa Drugs Cross Reactors Swelling   Cymbalta [Duloxetine Hcl]    Dust Mite Extract    Lactose Intolerance (Gi) Diarrhea   Lidoderm [Lidocaine]    Molds & Smuts    Duloxetine Rash   Polysporin [Bacitracin-Polymyxin B] Other (See Comments)    unknown    Allergies as of 03/08/2021       Reactions   Avelox [moxifloxacin] Shortness Of Breath, Swelling   Aricept [donepezil Hcl] Diarrhea   Codeine Swelling      Donepezil Diarrhea   Garlic Diarrhea   severe   Latex Hives, Itching, Rash, Other (See Comments)   Watery blisters    Onion Diarrhea   severe   Sulfa Drugs Cross Reactors Swelling   Cymbalta [duloxetine Hcl]    Dust Mite Extract    Lactose Intolerance (gi) Diarrhea   Lidoderm [lidocaine]    Molds & Smuts    Duloxetine Rash   Polysporin [bacitracin-polymyxin B] Other (See Comments)   unknown        Medication List        Accurate as of March 08, 2021  4:09 PM. If you have any questions, ask your nurse or doctor.          STOP taking these medications    Lactulose 20 GM/30ML Soln Stopped by: Yvonna Alanis, NP       TAKE these medications    acetaminophen 500 MG tablet Commonly known as: TYLENOL Take 500 mg by mouth 4 (four) times daily.    amiodarone 100 MG tablet Commonly known as: PACERONE Take 1 tablet (100 mg total) by mouth every other day.   B-12 1000 MCG Tabs Take 1,000 mcg by mouth daily.   Ben Gay Ultra Strength 11-27-28 % Crea Generic drug: Camphor-Menthol-Methyl Sal Apply 1 application topically 4 (four) times daily as needed. Apply to right hip and bilateral knees   calcium carbonate 1250 (500 Ca) MG chewable tablet Commonly known as: OS-CAL Chew 1 tablet by mouth daily.   cetirizine 10 MG tablet Commonly known as: ZYRTEC Take 10 mg by mouth daily with breakfast.   cholecalciferol 1000 units tablet Commonly known as: VITAMIN D Take 2,000 Units by mouth daily.   colestipol 1 g tablet Commonly known as: COLESTID Take 1 g by mouth 2 (two) times daily.   cycloSPORINE 0.05 % ophthalmic emulsion Commonly known as: RESTASIS Place 1 drop into both eyes 2 (two) times daily.   dextromethorphan-guaiFENesin 30-600 MG 12hr tablet Commonly known as: MUCINEX DM Take 1 tablet by mouth 2 (two) times daily.   famotidine 20 MG tablet Commonly known as: PEPCID Take 20 mg by mouth daily.   fluticasone 50 MCG/ACT nasal spray Commonly known as: FLONASE Place 1 spray into both nostrils daily.   gabapentin 300 MG capsule Commonly known as: NEURONTIN Take 900 mg by mouth daily.   gabapentin 100 MG capsule Commonly known as: NEURONTIN Take 300 mg by mouth 3 (three) times daily. Take 3 to = 300 mg TID   HYDROcodone-acetaminophen 5-325 MG tablet Commonly known as: NORCO/VICODIN Take 1 tablet by mouth every 6 (six) hours as needed for moderate pain.   levETIRAcetam 250 MG tablet Commonly known as: KEPPRA Take 125 mg by mouth every morning.   levETIRAcetam 250 MG tablet Commonly known as:  KEPPRA Take 500 mg by mouth daily.   loperamide 2 MG tablet Commonly known as: IMODIUM A-D Take 2 mg by mouth 4 (four) times daily as needed for diarrhea or loose stools.   mirtazapine 7.5 MG tablet Commonly known as:  REMERON Take 7.5 mg by mouth at bedtime.   multivitamin with minerals tablet Take 1 tablet by mouth daily.   ondansetron 4 MG tablet Commonly known as: ZOFRAN Take 1 tablet (4 mg total) by mouth every 8 (eight) hours as needed for up to 15 doses for nausea or vomiting.   pantoprazole 40 MG tablet Commonly known as: PROTONIX Take 40 mg by mouth at bedtime.   Rewetting Drops Soln by Does not apply route every 6 (six) hours as needed.        Review of Systems  Vitals:   03/08/21 1603  BP: (!) 141/67  Pulse: 60  Resp: 20  Temp: (!) 97.5 F (36.4 C)  SpO2: 94%  Weight: 92 lb 9.6 oz (42 kg)  Height: 5\' 2"  (1.575 m)   Body mass index is 16.94 kg/m. Physical Exam  Labs reviewed: Basic Metabolic Panel: Recent Labs    04/09/20 0919 05/24/20 0914 06/02/20 0000 10/25/20 0000 11/10/20 0000  NA 132* 129* 137 132* 137  K 3.8 3.6 3.9 4.2 4.0  CL 96* 94* 99 95* 98*  CO2 27 23 31* 28* 27*  GLUCOSE 96 97  --   --   --   BUN 13 11 12 20 16   CREATININE 0.53 0.64 0.7 0.6 0.5  CALCIUM 9.1 9.4 9.3 9.3 9.2   Liver Function Tests: Recent Labs    04/09/20 0919 06/02/20 0000 10/25/20 0000 11/10/20 0000  AST 21 17 13 14   ALT 13 10 6* 9  ALKPHOS 60 51 54 57  BILITOT 0.7  --   --   --   PROT 7.1  --   --   --   ALBUMIN 3.6 3.7 3.7 3.6   Recent Labs    04/09/20 0919  LIPASE 30   No results for input(s): AMMONIA in the last 8760 hours. CBC: Recent Labs    04/09/20 0919 05/24/20 0914 06/02/20 0000 10/25/20 0000 11/10/20 0000  WBC 8.4 7.1 5.6 6.8 7.4  NEUTROABS 6.4  --   --  4,128.00 4,899.00  HGB 13.8 13.9 13.9 13.3 13.0  HCT 40.6 40.0 40 38 38  MCV 93.3 93.0  --   --   --   PLT 232 219 187 212 237   Cardiac Enzymes: No results for input(s): CKTOTAL, CKMB, CKMBINDEX, TROPONINI in the last 8760 hours. BNP: Invalid input(s): POCBNP Lab Results  Component Value Date   HGBA1C 5.6 09/13/2014   Lab Results  Component Value Date   TSH 1.54 10/25/2020    Lab Results  Component Value Date   VITAMINB12 >2,000 05/14/2019   No results found for: FOLATE Lab Results  Component Value Date   IRON 14 (L) 12/13/2017   TIBC 255 05/14/2019   FERRITIN 22 12/13/2017    Imaging and Procedures obtained recently: No results found.  Assessment/Plan There are no diagnoses linked to this encounter.   Family/ staff Communication:   Labs/tests ordered:

## 2021-03-09 DIAGNOSIS — M6281 Muscle weakness (generalized): Secondary | ICD-10-CM | POA: Diagnosis not present

## 2021-03-09 DIAGNOSIS — Z9181 History of falling: Secondary | ICD-10-CM | POA: Diagnosis not present

## 2021-03-09 DIAGNOSIS — G629 Polyneuropathy, unspecified: Secondary | ICD-10-CM | POA: Diagnosis not present

## 2021-03-09 DIAGNOSIS — R29898 Other symptoms and signs involving the musculoskeletal system: Secondary | ICD-10-CM | POA: Diagnosis not present

## 2021-03-09 DIAGNOSIS — R2681 Unsteadiness on feet: Secondary | ICD-10-CM | POA: Diagnosis not present

## 2021-03-10 ENCOUNTER — Non-Acute Institutional Stay (SKILLED_NURSING_FACILITY): Payer: Medicare Other | Admitting: Orthopedic Surgery

## 2021-03-10 ENCOUNTER — Encounter: Payer: Self-pay | Admitting: Orthopedic Surgery

## 2021-03-10 DIAGNOSIS — R251 Tremor, unspecified: Secondary | ICD-10-CM

## 2021-03-10 DIAGNOSIS — D649 Anemia, unspecified: Secondary | ICD-10-CM | POA: Diagnosis not present

## 2021-03-10 DIAGNOSIS — F0390 Unspecified dementia without behavioral disturbance: Secondary | ICD-10-CM | POA: Diagnosis not present

## 2021-03-10 DIAGNOSIS — G63 Polyneuropathy in diseases classified elsewhere: Secondary | ICD-10-CM | POA: Diagnosis not present
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
## 2021-03-13 DIAGNOSIS — R29898 Other symptoms and signs involving the musculoskeletal system: Secondary | ICD-10-CM | POA: Diagnosis not present

## 2021-03-13 DIAGNOSIS — R2681 Unsteadiness on feet: Secondary | ICD-10-CM | POA: Diagnosis not present

## 2021-03-13 DIAGNOSIS — M6281 Muscle weakness (generalized): Secondary | ICD-10-CM | POA: Diagnosis not present

## 2021-03-13 DIAGNOSIS — G629 Polyneuropathy, unspecified: Secondary | ICD-10-CM | POA: Diagnosis not present

## 2021-03-13 DIAGNOSIS — Z9181 History of falling: Secondary | ICD-10-CM | POA: Diagnosis not present

## 2021-03-14 ENCOUNTER — Ambulatory Visit (INDEPENDENT_AMBULATORY_CARE_PROVIDER_SITE_OTHER): Payer: Medicare Other | Admitting: Cardiovascular Disease

## 2021-03-14 ENCOUNTER — Encounter: Payer: Self-pay | Admitting: Cardiovascular Disease

## 2021-03-14 ENCOUNTER — Other Ambulatory Visit: Payer: Self-pay

## 2021-03-14 DIAGNOSIS — I340 Nonrheumatic mitral (valve) insufficiency: Secondary | ICD-10-CM

## 2021-03-14 DIAGNOSIS — R2681 Unsteadiness on feet: Secondary | ICD-10-CM | POA: Diagnosis not present

## 2021-03-14 DIAGNOSIS — Z5181 Encounter for therapeutic drug level monitoring: Secondary | ICD-10-CM

## 2021-03-14 DIAGNOSIS — I272 Pulmonary hypertension, unspecified: Secondary | ICD-10-CM | POA: Diagnosis not present

## 2021-03-14 DIAGNOSIS — G629 Polyneuropathy, unspecified: Secondary | ICD-10-CM | POA: Diagnosis not present

## 2021-03-14 DIAGNOSIS — Z9181 History of falling: Secondary | ICD-10-CM | POA: Diagnosis not present

## 2021-03-14 DIAGNOSIS — Z79899 Other long term (current) drug therapy: Secondary | ICD-10-CM

## 2021-03-14 DIAGNOSIS — I350 Nonrheumatic aortic (valve) stenosis: Secondary | ICD-10-CM | POA: Diagnosis not present

## 2021-03-14 DIAGNOSIS — I480 Paroxysmal atrial fibrillation: Secondary | ICD-10-CM

## 2021-03-14 DIAGNOSIS — R29898 Other symptoms and signs involving the musculoskeletal system: Secondary | ICD-10-CM | POA: Diagnosis not present

## 2021-03-14 DIAGNOSIS — M6281 Muscle weakness (generalized): Secondary | ICD-10-CM | POA: Diagnosis not present

## 2021-03-14 NOTE — Patient Instructions (Signed)
Medication Instructions:  Your physician recommends that you continue on your current medications as directed. Please refer to the Current Medication list given to you today.  *If you need a refill on your cardiac medications before your next appointment, please call your pharmacy*   Lab Work: None ordered.    Testing/Procedures: None ordered.    Follow-Up: At [facility], you and your health needs are our priority.  As part of our continuing mission to provide you with exceptional heart care, we have created designated Provider Care Teams.  These Care Teams include your primary Cardiologist (physician) and Advanced Practice Providers (APPs -  Physician Assistants and Nurse Practitioners) who all work together to provide you with the care you need, when you need it.  We recommend signing up for the patient portal called "MyChart".  Sign up information is provided on this After Visit Summary.  MyChart is used to connect with patients for Virtual Visits (Telemedicine).  Patients are able to view lab/test results, encounter notes, upcoming appointments, etc.  Non-urgent messages can be sent to your provider as well.   To learn more about what you can do with MyChart, go to [email or web address].    Your next appointment:   6 month(s)  The format for your next appointment:   In Person  Provider:   Thomas Kelly, MD     

## 2021-03-14 NOTE — Progress Notes (Signed)
Patient ID: [name], female   DOB: 02/03/1922, 87 y.o.   MRN: [id number]      HPI: [name] is a 87 y.o. female presents to the office today for a for an 8 month follow-up cardiology evaluation.  [name] has a history of hypertension, hyperlipidemia, as well as valvular heart disease. An echo Doppler study in December 2013 showed normal systolic function with an ejection fraction of 55-60%. She had mild stenosis of her aortic valve, moderately severe calcified anulus of the mitral valve with moderate MR, moderately severe LA dilatation,  mild-to-moderate RA dilatation, moderate tricuspid regurgitation and moderately severe pulmonary hypertension with PA pressure estimated at 66 mm. Last year, I started her on amlodipine at 2.5 mg.  She felt that she was breathing better since initiating this therapy and  her blood pressure had markedly improved. A followup echo Doppler study on 11/16/2013 showed normal systolic function with an ejection fraction at 60-65%.  Diastolic parameters were normal.  She had mitral annular calcification with moderate mitral regurgitation.  The left atrium was moderately dilated, the right atrium was moderately dilated, and her tricuspid stenosis was now considered severe.  Pulmonary pressures were slightly reduced from previously, but were still elevated at 61 mm.    She was admitted to the hospital on 09/13/2014 and discharged  3 days later.  On the day of admission she awakened from sleep with midsternal all chest discomfort which he initially felt was indigestion.  Her troponin was mildly elevated at 0.23, which increased to 1.15 and there were new inferior T-wave abnormalities.  She was felt to have suffered a non-ST segment elevation MI.  She also developed paroxysmal atrial fibrillation with RVR, which resulted in similar symptomatology as her admission and was felt most likely that this may have been the cause for her admission.  I performed cardiac  catheterization on 09/14/14  Fluoroscopy revealed severe mitral annular calcification and very mild calcification in the region of the aortic valve.  She had hyperdynamic LV function with an ejection fraction of a proximally 70%.  There was severe mitral annular calcification with 3+ MR.  There was mild calcification in the region of the RCA ostium, but otherwise normal-appearing coronary arteries.  Right heart catheterization was performed and her wedge pressure mean was 10.  LV pressure was 152/11.  Pulmonary artery pressure was 33/12.  Cardiac output was 3.3 L/m by the thermodilution method and 3.7 L/m by the Fick method.  When I saw her in April 2016 she was doing well and maintaining sinus rhythm with ventricular rate at 58 bpm. She was recently hospitalized on 05/08/2015 through 05/12/2015 with this and presyncope.  She was found to be bradycardic and had a junctional rhythm.  Her Cardizem, beta blocker therapy was discontinued.  She  Was dehydrated andalso had mild elevation of potassium with renal insufficiency and ACE inhibition was discontinued. She also had a urinary tract infection for which she was treated with Keflex. She was started back on iron therapy for iron deficiency anemia. At discharge, Cardizem low dose was restarted.  After going home from the hospital, on 05/17/2015 her heart rate increased to greater than 100 bpm. Her blood pressure now has been increased on her, reduced medications.  She is on Coumadin therapy  With a chads2vasc score of 4.  When I saw her, her blood pressure was elevated and with her episodes of increased heart rate.  I started her on carvedilol one half of a  3.125 mg twice a day.  She has tolerated this well and denies any recurrent episodes of fast heartbeat or any episodes of dizziness.  She admits to trace edema above her pressure socks.  An echo Doppler study on 05/09/2015 showed an ejection fraction of 65-70%.  There was grade 1 diastolic dysfunction.  There was  mild aortic stenosis with trivial AR, severe mitral annular calcification with moderate MR, moderate left atrial dilatation, moderate TR and increased pulmonary pressures at 74 mm Hg. in December 2016, a carotid duplex study demonstrated heterogeneous plaque bilaterally.  There was 1-39% bilateral ICA stenoses.  She had normal subclavian arteries bilaterally.  She had patent vertebral arteries with antegrade flow  In October 2017 she had a fall and  tripped over a step.  She did not go to the emergency room, but saw her primary physician and a CT scan of her head revealed mild injury to her right occipital scalp but no underlying skull fracture or intracranial hemorrhage.  There was an air-fluid level of the maxillary sinus.   When I last saw her in April 2018.  She was not having any chest pain, although admitted to some mild shortness of breath.  She was walking with a cane.  specifically denies any chest pain.    She goes to a salt water pool twice per week.  She denies any episodes of dizziness.  She denies any further falls.  She is no longer driving.    I last saw her in October 2018 at which time she was getting more visibly weaker..  She denied any recurrent chest pain.  She really was experiencing some shortness of breath with walking.  She was unaware of recurrent arrhythmia.  She denies syncope.  A follow-up echo Doppler study in May 2018 showed an EF of 60-65% with grade 2 diastolic dysfunction.  There was mild aortic stenosis with trivial AR, severe left atrial dilation with severe mitral regurgitation and she had significant pulmonary hypertension with a PA peak pressure estimate at 64 mm.  her last office visit, I was concerned with potential fall risk.  Now that she has become more frail and ordered an event monitor to make certain she was not having recurrent PAF.  The monitor was done from 06/26/2017 through 07/25/2017.  She was maintaining sinus rhythm and had some mild bradycardia, sinus  arrhythmia with PACs, and some episodes of sinus tachycardia.  There were no episodes of recurrent AF.    She was evaluated by Loma Sousa, PAC on 08/10/2017 and more recently by Jory Sims, NP on 10/21/2017.  Given her advanced age and severe MR medical therapy has been continued without plans for surgical intervention.  She has since purchased a scooter to allow for improved mobility.  She still living in independent living at [facility] and has been avoiding switching to assisted living.    I last saw her, I had a long discussion with both she and her son concerning potential fall risk and consideration for possible discontinuance of anticoagulation.  At her much discussion, they preferred to stay on anticoagulation therapy.  As I saw her, she was admitted to [facility] on December 06, 2017 and was hospitalized until December 17, 2017.  She presented with GI bleed with a hemoglobin of 4 and ultimately received 3 units of packed red blood cell transfusion.  Her hemoglobin improved.  Warfarin was placed on hold.  She was not found to have significant abnormality on endoscopy although she  had a hiatal hernia without acute source of bleeding.  At the time colonoscopy was felt to be too high risk but if bleeding occurs this would be undertaken.  Subsequently, she developed atrial fibrillation.  I had seen her during her hospitalization and recommended discontinuance of warfarin therapy and that it not be reinstituted.  In the hospital, atrial fibrillation rate was initially treated with IV Cardizem as well as initiation of digoxin.  She later was started on amiodarone for rate control which apparently converted her back to sinus rhythm.  She was discharged on December 17, 2017 and initially went to assisted  living.  She has not had any further blood loss or drop in hemoglobin since her warfarin has been discontinued.  She denies any anginal type symptoms.  She has previously documentation of mitral  regurgitation which may provide some reduction in potential left atrial stasis if she were to return into A. fib.   I saw her for her initial post hospital follow-up evaluation on Dec 23, 2017.  At that time, she was maintaining sinus rhythm.  She was on amiodarone 200 mg twice a day I recommended she stay on that dose for a total of 4 weeks with plans to reduce to 300 mg at the beginning of June.  She subsequently called the office and stated that she felt that the amiodarone may be causing her eyesight to working and she felt a little "shaky. "  As result her dose was reduced to 200 mg.  She is unaware of any recurrent atrial fibrillation.  She denies any chest pressure.  Her eyesight is improved on her reduced dose.  She has noticed a mild hand tremor.  She is no longer in assisted living and is now back to independent living in her retirement community.    When I saw her in June 2019 she was maintaining sinus rhythm and I reduced her amiodarone down to 200 mg alternating with 100 mg every other day.  She has continued to feel well.  She is in independent living.  At time she notices the room spinning.  She is unaware of postural symptoms.  He is unaware of any recurrent atrial fibrillation.  She denies chest pressure.   When I last saw her in October 2019 she had unequal blood pressures in her right arm and left arm at 184/78 on the right and 160/80 on the left.  As result she underwent carotid and upper extremity Doppler studies which suggested mild left subclavian stenosis.    She has been residing at [facility] in independent living.  Her son is there with her today for this telemedicine visit.  Presently, she denies chest pain.  She denies any discomfort down her left arm.  She admits that she has episodes where shakes more particularly with stressful situations.  She denies significant swelling.  She does experience some mild shortness of breath with activity.   Since her last telemedicine  visit evaluation in June 2020, on May 01, 2019 she had fallen and unfortunately sustained a left displaced femoral neck fracture.  She was hospitalized and underwent left hip monopolar hemi arthroplasty on May 02, 2019.  While hospitalized, she underwent a follow-up echo Doppler study on May 02, 2019 which showed hyperdynamic LV function with EF greater than 65%.  There was grade 2 diastolic dysfunction.  There was moderate thickening of her aortic valve with moderate calcification and moderate stenosis.  There was moderate mitral regurgitation.  Carotid studies  did not reveal any high-grade stenoses but bilateral 1 to 39% stenoses were present.  She had normal right vertebral flow with antegrade apparent flow on the left as well as disturbed left subclavian artery flow with left subclavian stenosis.  She subsequently did well but had developed post hospital anemia leading to reevaluation.  She was transfused with 1 pack unit of red blood cells in the emergency room on May 12, 2019.   For several months, she was at an assisted living and underwent physical therapy and Occupational Therapy.  Over the past several weeks she is now returned back to her independent living apartment.   She was seen in a telemedicine visit on 09/17/2019.  At that time she felt well and believes she was back to baseline.  Walking with a walker.  If she overdoes it she does experience a mild shortness of breath that resolves with rest.  I last saw her in December 2021.  Since her prior evaluation she was no longer in independent living and was now in the skilled nursing facility at [facility].  She has had several falls with the last occurring in October.  She denied any chest pain or palpitations.  Shews unaware of recurrent atrial fibrillation.  She was in the office today with her son.  During that evaluation, her ECG showed sinus bradycardia at 53 bpm and I recommended further reduction of her  amiodarone from 100 mg daily down to 100 mg every other day.  She had previously been demonstrated to have stenosis in her left subclavian artery in 2019 with mild carotid plaque.  I last saw her, she continues to be in skilled nursing.  She denies any chest pain.  She admits to occasional shortness of breath.  She is unaware of any palpitations or recurrent atrial fibrillation.  At times she does spirits a mild tremor.  She continues to be on amiodarone 100 mg every other day.  She is on Keppra, Neurontin, prescribed by her other physicians.  She denies chest pain PND orthopnea.  She presents for evaluation.   Past Medical History:  Diagnosis Date   Abnormality of gait 09/07/2015   Anemia, iron deficiency 09/16/2014   Anxiety    Arthritis    "hands and feet" (12/06/2017)   CAD (coronary artery disease)    a. 08/2014 NSTEMI/Cath: mild Ca2+ or RCA ostium, otw nl cors. CO 3.3 L/min (thermo), 3.7 L/min (Fick).   Carotid arterial disease ([place]) 07/30/2012   carotid doppler; normal study   CHF (congestive heart failure) (HCC)    Chronic anticoagulation, with coumadin, with PAF and CHADS2Vasc2 score of 4 09/16/2014   Chronic back pain    "all over" (9/56/2130)   Complication of anesthesia    "they had hard time waking me up" (12/06/2017)   Degenerative arthritis    Diverticulosis    Essential hypertension    GERD (gastroesophageal reflux disease)    History of blood transfusion 12/06/2017   History of hiatal hernia    History of nuclear stress test 09/19/2007   normal pattern of perfusion; post-stress EF 86%; EKG negative for ischemia; low risk scan   Hyperlipidemia    Left carotid bruit    a. 07/2015 Carotid U/S: 1-39% bilat ICA stenosis.   Memory disorder 03/05/2014   Mild renal insufficiency    Mitral prolapse    Neuropathy    "hands and feet" (12/06/2017)   PAF (paroxysmal atrial fibrillation) ([place])    a. 08/2014-->Coumadin (CHA2DS2VASc = 4).  Peripheral neuropathy    Small fiber     Pre-syncope    a. 04/2015 in setting of bradycardia-->CCB/BB doses adjusted.   Pulmonary hypertension (HCC)    Seasonal allergies    Skin cancer    "burned off my face" (12/06/2017)   Valvular heart disease    a. 04/2015 Echo: EF 65-70%, no rwma, Gr1 DD, mild AS, triv AI, mild MS, mod MR, mod dil LA, mod TR, sev increased PASP.    Past Surgical History:  Procedure Laterality Date   ABDOMINAL HYSTERECTOMY     APPENDECTOMY     BUNIONECTOMY Bilateral    CARPAL TUNNEL RELEASE Left    CHOLECYSTECTOMY OPEN     DILATION AND CURETTAGE OF UTERUS     ESOPHAGOGASTRODUODENOSCOPY N/A 12/10/2017   Procedure: ESOPHAGOGASTRODUODENOSCOPY (EGD);  Surgeon: Vida Rigger, MD;  Location: [facility] ENDOSCOPY;  Service: Endoscopy;  Laterality: N/A;   FERTILITY SURGERY     resuspension procedure    FOOT FRACTURE SURGERY Left    FRACTURE SURGERY     HIP ARTHROPLASTY Left 05/02/2019   Procedure: ARTHROPLASTY BIPOLAR HIP (HEMIARTHROPLASTY);  Surgeon: Eldred Manges, MD;  Location: [facility] ORS;  Service: Orthopedics;  Laterality: Left;   JOINT REPLACEMENT     LEFT HEART CATHETERIZATION WITH CORONARY ANGIOGRAM N/A 09/14/2014   Procedure: LEFT HEART CATHETERIZATION WITH CORONARY ANGIOGRAM;  Surgeon: Lennette Bihari, MD;  Location: [facility] CATH LAB;  Service: Cardiovascular;  Laterality: N/A;   REPLACEMENT TOTAL KNEE Right 03/2008   Hattie Perch 12/19/2010   ROBOTIC ASSISTED BILATERAL SALPINGO OOPHERECTOMY Bilateral 02/19/2017   Procedure: XI ROBOTIC ASSISTED BILATERAL SALPINGO OOPHORECTOMY AND LYSIS OF ADHESION;  Surgeon: Adolphus Birchwood, MD;  Location: [facility] ORS;  Service: Gynecology;  Laterality: Bilateral;   TEAR DUCT PROBING Left    TONSILLECTOMY     UMBILICAL HERNIA REPAIR      Allergies  Allergen Reactions   Avelox [Moxifloxacin] Shortness Of Breath and Swelling   Aricept [Donepezil Hcl] Diarrhea   Codeine Swelling        Donepezil Diarrhea   Garlic Diarrhea    severe   Latex Hives, Itching, Rash and Other (See Comments)    Watery  blisters    Onion Diarrhea    severe   Sulfa Drugs Cross Reactors Swelling   Cymbalta [Duloxetine Hcl]    Dust Mite Extract    Lactose Intolerance (Gi) Diarrhea   Lidoderm [Lidocaine]    Molds & Smuts    Duloxetine Rash   Polysporin [Bacitracin-Polymyxin B] Other (See Comments)    unknown    Current Outpatient Medications  Medication Sig Dispense Refill   acetaminophen (TYLENOL) 500 MG tablet Take 500 mg by mouth 4 (four) times daily.     amiodarone (PACERONE) 100 MG tablet Take 1 tablet (100 mg total) by mouth every other day. 45 tablet 3   calcium carbonate (OS-CAL) 1250 (500 Ca) MG chewable tablet Chew 1 tablet by mouth daily.     Camphor-Menthol-Methyl Sal (BEN GAY ULTRA STRENGTH) 11-27-28 % CREA Apply 1 application topically 4 (four) times daily as needed. Apply to right hip and bilateral knees     cetirizine (ZYRTEC) 10 MG tablet Take 10 mg by mouth daily with breakfast.      cholecalciferol (VITAMIN D) 1000 UNITS tablet Take 2,000 Units by mouth daily.     colestipol (COLESTID) 1 g tablet Take 1 g by mouth 2 (two) times daily.     Cyanocobalamin (B-12) 1000 MCG TABS Take 1,000 mcg by mouth daily.  cycloSPORINE (RESTASIS) 0.05 % ophthalmic emulsion Place 1 drop into both eyes 2 (two) times daily.     dextromethorphan-guaiFENesin (MUCINEX DM) 30-600 MG 12hr tablet Take 1 tablet by mouth 2 (two) times daily.     famotidine (PEPCID) 20 MG tablet Take 20 mg by mouth daily.     fluticasone (FLONASE) 50 MCG/ACT nasal spray Place 1 spray into both nostrils daily.      gabapentin (NEURONTIN) 100 MG capsule Take 300 mg by mouth 3 (three) times daily. Take 3 to = 300 mg TID     gabapentin (NEURONTIN) 300 MG capsule Take 900 mg by mouth daily.     HYDROcodone-acetaminophen (NORCO/VICODIN) 5-325 MG tablet Take 1 tablet by mouth every 6 (six) hours as needed for moderate pain.     levETIRAcetam (KEPPRA) 250 MG tablet Take 125 mg by mouth every morning.     levETIRAcetam (KEPPRA) 250 MG  tablet Take 500 mg by mouth daily.     loperamide (IMODIUM A-D) 2 MG tablet Take 2 mg by mouth 4 (four) times daily as needed for diarrhea or loose stools.     mirtazapine (REMERON) 7.5 MG tablet Take 7.5 mg by mouth at bedtime.     Multiple Vitamins-Minerals (MULTIVITAMIN WITH MINERALS) tablet Take 1 tablet by mouth daily.     ondansetron (ZOFRAN) 4 MG tablet Take 1 tablet (4 mg total) by mouth every 8 (eight) hours as needed for up to 15 doses for nausea or vomiting. 12 tablet 0   pantoprazole (PROTONIX) 40 MG tablet Take 40 mg by mouth at bedtime.      Soft Lens Products (REWETTING DROPS) SOLN by Does not apply route every 6 (six) hours as needed.     No current facility-administered medications for this visit.    Socially she is widowed. Has 2 children and 2 grandchildren. She does walk; no tobacco or alcohol use. She resides at [facility]: independent living.  She remains active.  ROS General: Negative; No fevers, chills, or night sweats; more weakness HEENT: Negative; No changes in vision or hearing, sinus congestion, difficulty swallowing Pulmonary: Negative; No cough, wheezing, hemoptysis Cardiovascular: See history of present illness  GI: Negative; No nausea, vomiting, diarrhea, or abdominal pain GU:  Recent UTI Musculoskeletal: Negative; no myalgias, joint pain, or weakness; she is now walking with a cane Hematologic/Oncology: Negative; no easy bruising, bleeding Endocrine: Negative; no heat/cold intolerance; no diabetes Neuro: Occasional vertigo. Skin: Negative; No rashes or skin lesions Psychiatric: Negative; No behavioral problems, depression Sleep: Negative; No snoring, daytime sleepiness, hypersomnolence, bruxism, restless legs, hypnogognic hallucinations, no cataplexy Other comprehensive 14 point system review is negative.   PE BP (!) 115/59   Pulse 60   Ht $R'5\' 2"'KZ$  (1.575 m)   Wt 100 lb (45.4 kg)   SpO2 95%   BMI 18.29 kg/m    Repeat blood pressure by me was  110/60  Wt Readings from Last 3 Encounters:  03/14/21 100 lb (45.4 kg)  03/10/21 92 lb 9.6 oz (42 kg)  03/08/21 92 lb 9.6 oz (42 kg)   General: Alert, oriented, no distress.  Skin: normal turgor, no rashes, warm and dry HEENT: Normocephalic, atraumatic. Pupils equal round and reactive to light; sclera anicteric; extraocular muscles intact;  Nose without nasal septal hypertrophy Mouth/Parynx benign; Neck: No JVD, no carotid bruits; normal carotid upstroke Lungs: clear to ausculatation and percussion; no wheezing or rales Chest wall: without tenderness to palpitation Heart: PMI not displaced, RRR, s1 s2 normal, 2/6 systolic murmur,  no diastolic murmur, no rubs, gallops, thrills, or heaves Abdomen: soft, nontender; no hepatosplenomehaly, BS+; abdominal aorta nontender and not dilated by palpation. Back: no CVA tenderness; scoliosis Pulses 2+ Musculoskeletal: full range of motion, normal strength, no joint deformities Extremities: no clubbing cyanosis or edema, Homan's sign negative  Neurologic: grossly nonfocal; Cranial nerves grossly wnl Psychologic: Normal mood and affect    ECG (independently read by me):  NSR with mild sinus arrythmia at 60, no ectopy.  Normal intervals with QTc interval 428 ms  July 25, 2020 ECG (independently read by me): Sinus bradycardia at 53 with sinus arrythmia; LVH  May 26, 2018 ECG (independently read by me): Normal sinus rhythm at 60 bpm.  Normal intervals.  No ectopy.  Q waves 1 and aVL  February 13, 2018 ECG (independently read by me): Sinus bradycardia 55 bpm.  PAC.  LVH by voltage.  Dec 23, 2017 ECG (independently read by me): Normal sinus rhythm at 60 bpm with mild sinus arrhythmia.  Left axis deviation.  Normal intervals.  QTC interval 420 ms  November 08, 2017 ECG (independently read by me): sinus bradycardia at 59 bpm.  PAC.  Mild criteria for LVH.  QTc interval 445 ms.  October 2018 ECG (independently read by me): Normal sinus rhythm at 60 bpm.   Left axis deviation.  Borderline voltage criteria for LVH in aVL.  April 2018 ECG (independently read by me): Sinus bradycardia 58 bpm.  PH by voltage.  No significant ST-T changes.  October 2017 ECG (independently read by me): Normal sinus rhythm at 66 bpm.  No ectopy.  Normal intervals.  Borderline LVH by voltage in aVL.  December 2016 ECG (independently read by me): Normal sinus rhythm at 78 bpm.  Normal intervals.  ECG (independently read by me):  Sinus rhythm with sinus arrhythmia at 80 bpm.  Normal intervals.  No ST segment changes.  April 2016 ECG (independently read by me): Sinus bradycardia 58 bpm.  Left axis deviation.  LVH by voltage criteria in aVL.  No significant ST segment changes.  October 2015 ECG (independently read by me): Sinus bradycardia 48 beats per minute.  No ectopy.  PR interval 164 ms  Prior April 2015 ECG (independently by me) sinus bradycardia 50 beats per minute.  No ectopy.  Normal intervals.  No ST segment changes.  Prior 09/02/2013 ECG (independently read by me ): Sinus bradycardia at 51 beats per minute. Left axis deviation. No significant ST change. PR interval 160 ms. QTc interval 418 ms.   LABS:  BMP Latest Ref Rng & Units 11/10/2020 10/25/2020 06/02/2020  Glucose 70 - 99 mg/dL - - -  BUN 4 - $R'21 16 20 12  'Ru$ Creatinine 0.5 - 1.1 0.5 0.6 0.7  BUN/Creat Ratio 12 - 28 - - -  Sodium 137 - 147 137 132(A) 137  Potassium 3.4 - 5.3 4.0 4.2 3.9  Chloride 99 - 108 98(A) 95(A) 99  CO2 13 - 22 27(A) 28(A) 31(A)  Calcium 8.7 - 10.7 9.2 9.3 9.3    Hepatic Function Latest Ref Rng & Units 11/10/2020 10/25/2020 06/02/2020  Total Protein 6.5 - 8.1 g/dL - - -  Albumin 3.5 - 5.0 3.6 3.7 3.7  AST 13 - 35 $Re'14 13 17  'Hgd$ ALT 7 - 35 9 6(A) 10  Alk Phosphatase 25 - 125 57 54 51  Total Bilirubin 0.3 - 1.2 mg/dL - - -  Bilirubin, Direct 0.0 - 0.2 mg/dL - - -    CBC Latest Ref Rng &  Units 11/10/2020 10/25/2020 06/02/2020  WBC - 7.4 6.8 5.6  Hemoglobin 12.0 - 16.0 13.0 13.3 13.9   Hematocrit 36 - 46 38 38 40  Platelets 150 - 399 237 212 187   Lab Results  Component Value Date   MCV 93.0 05/24/2020   MCV 93.3 04/09/2020   MCV 101.3 (H) 05/12/2019   Lab Results  Component Value Date   TSH 1.54 10/25/2020   Lab Results  Component Value Date   HGBA1C 5.6 09/13/2014    BNP No results found for: PROBNP   Lipid Panel     Component Value Date/Time   CHOL 111 12/11/2016 0707   TRIG 71 12/11/2016 0707   HDL 66 12/11/2016 0707   CHOLHDL 1.7 12/11/2016 0707   VLDL 14 12/11/2016 0707   LDLCALC 31 12/11/2016 0707     RADIOLOGY: No results found.  IMPRESSION:  1. PAF (paroxysmal atrial fibrillation) ([place])   2. Nonrheumatic aortic valve stenosis   3. Pulmonary hypertension ([place])   4. Moderately severe mitral regurgitation   5. Encounter for monitoring amiodarone therapy     ASSESSMENT AND PLAN: Ms. Citron is a very pleasant 87 years old Caucasian female who has a history of hypertension, hyperlipidemia, significant mitral annular calcification with MR.  During her hospitalization in 2016 she was noted to have paroxysmal atrial fibrillation with rapid ventricular response which may have been the which awakened her from sleep.   An echo Doppler study in September 2016 showed an ejection fraction at 65-70%.  There was mild LVH.  Grade 1 diastolic dysfunction.  There was evidence for mild aortic valve stenosis with trivial AR, severely calcified mitral annulus with mild stenosis and moderate regurgitation.  She had moderate LA dilatation and moderate TR with significant pulmonary pressure elevation at 74 mm Hg.  Cardiac catheterization did not reveal significant coronary obstructive disease.  Following diuresis she did not have significant pulmonary hypertension and her PA pressure on right heart catheterization was only 33 mm systolically.   An echo Doppler study of May 2018 continued to show normal systolic function with grade 2 diastolic dysfunction and  suggested progression of her mitral valve disease to severe mitral regurgitation in the setting of a severely dilated left atrium, mild aortic stenosis with trivial AR, and a PA pressure estimated at 64 mm.  Her echo in April 2019 showed normal systolic function with EF at 60 to 65% with mild LVH.  She now had mild aortic stenosis with a mean gradient of 19 and peak gradient of 31 mm.  There was moderate mitral regurgitation.  There was severe left atrial dilatation with mild RA dilatation.  Peak PA pressure was 65 mm.  She had developed recurrent atrial fibrillation after developing a GI bleed with profound drop in hemoglobin to 4.  She has been maintaining sinus rhythm and is now on a reduced dose of amiodarone at 200 mg alternating with 100 mg every other day.  Her most recent echo in September 2020 showed hyperdynamic LV function with EF greater than 65%.  There was moderate aortic stenosis with a mean gradient of 27 mm.  She did have pulmonary hypertension with increased PA pressure at 67 mm and moderately severe mitral regurgitation.  When seen remotely, I had reduced her amiodarone to 100 mg daily.  In December 2021, due to bradycardia, amiodarone was further reduced to 100 mg every other day.  She has tolerated the every other day regimen and her resting pulse today is 60.  She is not aware of recurrent episodes of atrial fibrillation.  Laboratory has been stable on her very low-dose amiodarone.  On exam her aortic stenosis murmur appears slightly more prominent.  She will be turning 89 in September of this year and conservative approach is recommended.  She does note occasional shortness of breath which I suspect may be contributed by her aortic stenosis.  She is not having any anginal symptoms, presyncope or syncope.  Her blood pressure today is stable and a repeat by me was 110/60.  She is euvolemic on exam.  At last laboratory, hemoglobin has been stable.  Creatinine was 0.5.  TSH was normal at 1.5 in  March 2022.  She has been documented to have gnosis in the left subclavian artery with mild carotid plaque.  She was here with her son today.  I will see her in 6 months for reevaluation.   Troy Sine, MD, [facility]  03/14/2021 6:03 PM

## 2021-03-15 ENCOUNTER — Encounter: Payer: Self-pay | Admitting: Orthopedic Surgery

## 2021-03-15 ENCOUNTER — Non-Acute Institutional Stay (SKILLED_NURSING_FACILITY): Payer: Medicare Other | Admitting: Orthopedic Surgery

## 2021-03-15 DIAGNOSIS — R0781 Pleurodynia: Secondary | ICD-10-CM

## 2021-03-15 DIAGNOSIS — R079 Chest pain, unspecified: Secondary | ICD-10-CM | POA: Diagnosis not present

## 2021-03-15 NOTE — Progress Notes (Signed)
Location:   [facility] Room Number: N20 Place of Service:  SNF (31) Provider:  Windell Moulding, NP    Patient Care Team: Virgie Dad, MD as PCP - General (Internal Medicine) Troy Sine, MD as PCP - Cardiology (Cardiology) Troy Sine, MD as Consulting Physician (Cardiology)  Extended Emergency Contact Information Primary Emergency Contact: Wisz,Bill Address: [address]          [place], [place] [address] Johnnette Litter of [place] Phone: [phone] Relation: Son Secondary Emergency Contact: [place] Mobile Phone: [phone] Relation: Daughter  Code Status:  DNR Goals of care: Advanced Directive information Advanced Directives 03/15/2021  Does Patient Have a Medical Advance Directive? Yes  Type of Paramedic of [place];Living will;Out of facility DNR (pink MOST or yellow form)  Does patient want to make changes to medical advance directive? No - Patient declined  Copy of [place] in Chart? Yes - validated most recent copy scanned in chart (See row information)  Would patient like information on creating a medical advance directive? -  Pre-existing out of facility DNR order (yellow form or pink MOST form) Pink MOST form placed in chart (order not valid for inpatient use)     Chief Complaint  Patient presents with   Acute Visit    Patient presents after a fall with increased pain    HPI:  Pt is a 87 y.o. female seen today for an acute visit for left sided pain.   She currently resides on the skilled unit at [facility]. Past medical history includes: heart failure, atrial fib, HTN, NSTEMI, c.diff, GERD, vascular dementia, depression, and weakness.  This morning she tripped and landed on her left side. Event unwitnessed by staff. She reports pain to her left side after fall. Pain located under her left breast. Pain increased when she takes a deep breath or moves, described as  sharp. Pain rated 5/10. She was given hydrocodone 5/325 mg this morning, at this time she reports her pain as a 3/10. She denies chest pain, sob, dizziness or lightheadedness.   Nurse does not report any other concerns, vitals stable.   Past Medical History:  Diagnosis Date   Abnormality of gait 09/07/2015   Anemia, iron deficiency 09/16/2014   Anxiety    Arthritis    "hands and feet" (12/06/2017)   CAD (coronary artery disease)    a. 08/2014 NSTEMI/Cath: mild Ca2+ or RCA ostium, otw nl cors. CO 3.3 L/min (thermo), 3.7 L/min ([place]).   Carotid arterial disease ([place]) 07/30/2012   carotid doppler; normal study   CHF (congestive heart failure) (HCC)    Chronic anticoagulation, with coumadin, with PAF and CHADS2Vasc2 score of 4 09/16/2014   Chronic back pain    "all over" (0000000)   Complication of anesthesia    "they had hard time waking me up" (12/06/2017)   Degenerative arthritis    Diverticulosis    Essential hypertension    GERD (gastroesophageal reflux disease)    History of blood transfusion 12/06/2017   History of hiatal hernia    History of nuclear stress test 09/19/2007   normal pattern of perfusion; post-stress EF 86%; EKG negative for ischemia; low risk scan   Hyperlipidemia    Left carotid bruit    a. 07/2015 Carotid U/S: 1-39% bilat ICA stenosis.   Memory disorder 03/05/2014   Mild renal insufficiency    Mitral prolapse    Neuropathy    "hands and feet" (12/06/2017)  PAF (paroxysmal atrial fibrillation) (HCC)    a. 08/2014-->Coumadin (CHA2DS2VASc = 4).   Peripheral neuropathy    Small fiber    Pre-syncope    a. 04/2015 in setting of bradycardia-->CCB/BB doses adjusted.   Pulmonary hypertension (HCC)    Seasonal allergies    Skin cancer    "burned off my face" (12/06/2017)   Valvular heart disease    a. 04/2015 Echo: EF 65-70%, no rwma, Gr1 DD, mild AS, triv AI, mild MS, mod MR, mod dil LA, mod TR, sev increased PASP.   Past Surgical History:  Procedure Laterality  Date   ABDOMINAL HYSTERECTOMY     APPENDECTOMY     BUNIONECTOMY Bilateral    CARPAL TUNNEL RELEASE Left    CHOLECYSTECTOMY OPEN     DILATION AND CURETTAGE OF UTERUS     ESOPHAGOGASTRODUODENOSCOPY N/A 12/10/2017   Procedure: ESOPHAGOGASTRODUODENOSCOPY (EGD);  Surgeon: Clarene Essex, MD;  Location: [facility];  Service: Endoscopy;  Laterality: N/A;   FERTILITY SURGERY     resuspension procedure    FOOT FRACTURE SURGERY Left    FRACTURE SURGERY     HIP ARTHROPLASTY Left 05/02/2019   Procedure: ARTHROPLASTY BIPOLAR HIP (HEMIARTHROPLASTY);  Surgeon: Marybelle Killings, MD;  Location: [facility];  Service: Orthopedics;  Laterality: Left;   JOINT REPLACEMENT     LEFT HEART CATHETERIZATION WITH CORONARY ANGIOGRAM N/A 09/14/2014   Procedure: LEFT HEART CATHETERIZATION WITH CORONARY ANGIOGRAM;  Surgeon: Troy Sine, MD;  Location: [facility] CATH LAB;  Service: Cardiovascular;  Laterality: N/A;   REPLACEMENT TOTAL KNEE Right 03/2008   Archie Endo 12/19/2010   ROBOTIC ASSISTED BILATERAL SALPINGO OOPHERECTOMY Bilateral 02/19/2017   Procedure: XI ROBOTIC ASSISTED BILATERAL SALPINGO OOPHORECTOMY AND LYSIS OF ADHESION;  Surgeon: Everitt Amber, MD;  Location: [facility];  Service: Gynecology;  Laterality: Bilateral;   TEAR DUCT PROBING Left    TONSILLECTOMY     UMBILICAL HERNIA REPAIR      Allergies  Allergen Reactions   Avelox [Moxifloxacin] Shortness Of Breath and Swelling   Aricept [Donepezil Hcl] Diarrhea   Codeine Swelling        Donepezil Diarrhea   Garlic Diarrhea    severe   Latex Hives, Itching, Rash and Other (See Comments)    Watery blisters    Onion Diarrhea    severe   Sulfa Drugs Cross Reactors Swelling   Cats Claw (Uncaria Tomentosa) Swelling   Cymbalta [Duloxetine Hcl]    Dust Mite Extract    Lactose Intolerance (Gi) Diarrhea   Lidoderm [Lidocaine]    Molds & Smuts    Duloxetine Rash   Polysporin [Bacitracin-Polymyxin B] Other (See Comments)    unknown    Allergies as of 03/15/2021        Reactions   Avelox [moxifloxacin] Shortness Of Breath, Swelling   Aricept [donepezil Hcl] Diarrhea   Codeine Swelling      Donepezil Diarrhea   Garlic Diarrhea   severe   Latex Hives, Itching, Rash, Other (See Comments)   Watery blisters    Onion Diarrhea   severe   Sulfa Drugs Cross Reactors Swelling   Cats Claw (uncaria Tomentosa) Swelling   Cymbalta [duloxetine Hcl]    Dust Mite Extract    Lactose Intolerance (gi) Diarrhea   Lidoderm [lidocaine]    Molds & Smuts    Duloxetine Rash   Polysporin [bacitracin-polymyxin B] Other (See Comments)   unknown        Medication List        Accurate as of March 15, 2021  9:58 AM. If you have any questions, ask your nurse or doctor.          acetaminophen 500 MG tablet Commonly known as: TYLENOL Take 500 mg by mouth 4 (four) times daily.   amiodarone 100 MG tablet Commonly known as: PACERONE Take 1 tablet (100 mg total) by mouth every other day.   B-12 1000 MCG Tabs Take 1,000 mcg by mouth daily.   Ben Gay Ultra Strength 11-27-28 % Crea Generic drug: Camphor-Menthol-Methyl Sal Apply 1 application topically 4 (four) times daily as needed. Apply to right hip and bilateral knees   calcium carbonate 1250 (500 Ca) MG chewable tablet Commonly known as: OS-CAL Chew 1 tablet by mouth daily.   cetirizine 10 MG tablet Commonly known as: ZYRTEC Take 10 mg by mouth daily with breakfast.   cholecalciferol 1000 units tablet Commonly known as: VITAMIN D Take 2,000 Units by mouth daily.   colestipol 1 g tablet Commonly known as: COLESTID Take 1 g by mouth 2 (two) times daily.   cycloSPORINE 0.05 % ophthalmic emulsion Commonly known as: RESTASIS Place 1 drop into both eyes 2 (two) times daily.   dextromethorphan-guaiFENesin 30-600 MG 12hr tablet Commonly known as: MUCINEX DM Take 1 tablet by mouth 2 (two) times daily.   famotidine 20 MG tablet Commonly known as: PEPCID Take 20 mg by mouth daily.   fluticasone 50  MCG/ACT nasal spray Commonly known as: FLONASE Place 1 spray into both nostrils daily.   gabapentin 300 MG capsule Commonly known as: NEURONTIN Take 900 mg by mouth daily.   gabapentin 100 MG capsule Commonly known as: NEURONTIN Take 300 mg by mouth 3 (three) times daily. Take 3 to = 300 mg TID   HYDROcodone-acetaminophen 5-325 MG tablet Commonly known as: NORCO/VICODIN Take 1 tablet by mouth every 6 (six) hours as needed for moderate pain.   levETIRAcetam 250 MG tablet Commonly known as: KEPPRA Take 125 mg by mouth every morning.   levETIRAcetam 250 MG tablet Commonly known as: KEPPRA Take 500 mg by mouth daily.   loperamide 2 MG tablet Commonly known as: IMODIUM A-D Take 2 mg by mouth 4 (four) times daily as needed for diarrhea or loose stools.   mirtazapine 7.5 MG tablet Commonly known as: REMERON Take 7.5 mg by mouth at bedtime.   multivitamin with minerals tablet Take 1 tablet by mouth daily.   ondansetron 4 MG tablet Commonly known as: ZOFRAN Take 1 tablet (4 mg total) by mouth every 8 (eight) hours as needed for up to 15 doses for nausea or vomiting.   pantoprazole 40 MG tablet Commonly known as: PROTONIX Take 40 mg by mouth at bedtime.   Rewetting Drops Soln by Does not apply route every 6 (six) hours as needed.        Review of Systems  Constitutional:  Negative for activity change, appetite change, fatigue and fever.  Respiratory:  Negative for cough, shortness of breath and wheezing.   Cardiovascular:  Negative for chest pain and leg swelling.  Musculoskeletal:        Left sided pain  Neurological:  Negative for dizziness and light-headedness.  Psychiatric/Behavioral:  Positive for confusion. Negative for dysphoric mood. The patient is not nervous/anxious.    Immunization History  Administered Date(s) Administered   Influenza, High Dose Seasonal PF 05/23/2015, 06/04/2016, 06/13/2017   Influenza, Quadrivalent, Recombinant, Inj, Pf 06/05/2018    Influenza-Unspecified 05/01/2012, 05/20/2013, 06/20/2018, 05/11/2020   Moderna Sars-Covid-2 Vaccination 08/24/2019, 09/21/2019, 07/04/2020  Pneumococcal Polysaccharide-23 03/19/2002   Tdap 01/08/2011, 11/20/2011   Zoster Recombinat (Shingrix) 02/13/2018, 06/05/2018   Zoster, Live 09/20/2007   Pertinent  Health Maintenance Due  Topic Date Due   DEXA SCAN  Never done   PNA vac Low Risk Adult (2 of 2 - PCV13) 03/20/2003   INFLUENZA VACCINE  03/20/2021   Fall Risk  03/08/2021 04/17/2018 01/27/2016  Falls in the past year? 0 Yes No  Number falls in past yr: 0 1 -  Injury with Fall? 0 Yes -  Risk for fall due to : History of fall(s);Impaired balance/gait;Impaired mobility Impaired balance/gait -  Follow up Falls evaluation completed;Education provided;Falls prevention discussed - -   Functional Status Survey:    Vitals:   03/15/21 0948  BP: 136/64  Pulse: 73  Resp: 20  Temp: (!) 97 F (36.1 C)  SpO2: 93%  Weight: 92 lb 9.6 oz (42 kg)  Height: '5\' 2"'$  (1.575 m)   Body mass index is 16.94 kg/m. Physical Exam Vitals reviewed.  Constitutional:      General: She is not in acute distress. HENT:     Head: Normocephalic.  Cardiovascular:     Rate and Rhythm: Normal rate. Rhythm irregular.     Pulses: Normal pulses.     Heart sounds: Normal heart sounds. No murmur heard. Pulmonary:     Effort: Pulmonary effort is normal. No respiratory distress.     Breath sounds: Normal breath sounds. No wheezing.  Musculoskeletal:     Comments: Left side, under breast with no bruising or skin breakdown, area tender to touch. Left upper arm with FROM.   Skin:    General: Skin is warm and dry.     Capillary Refill: Capillary refill takes less than 2 seconds.  Neurological:     General: No focal deficit present.     Mental Status: She is alert. Mental status is at baseline.     Motor: Weakness present.     Gait: Gait abnormal.     Comments: walker  Psychiatric:        Mood and Affect: Mood  normal.        Behavior: Behavior normal.        Cognition and Memory: Memory is impaired.    Labs reviewed: Recent Labs    04/09/20 0919 05/24/20 0914 06/02/20 0000 10/25/20 0000 11/10/20 0000  NA 132* 129* 137 132* 137  K 3.8 3.6 3.9 4.2 4.0  CL 96* 94* 99 95* 98*  CO2 27 23 31* 28* 27*  GLUCOSE 96 97  --   --   --   BUN '13 11 12 20 16  '$ CREATININE 0.53 0.64 0.7 0.6 0.5  CALCIUM 9.1 9.4 9.3 9.3 9.2   Recent Labs    04/09/20 0919 06/02/20 0000 10/25/20 0000 11/10/20 0000  AST '21 17 13 14  '$ ALT 13 10 6* 9  ALKPHOS 60 51 54 57  BILITOT 0.7  --   --   --   PROT 7.1  --   --   --   ALBUMIN 3.6 3.7 3.7 3.6   Recent Labs    04/09/20 0919 05/24/20 0914 06/02/20 0000 10/25/20 0000 11/10/20 0000  WBC 8.4 7.1 5.6 6.8 7.4  NEUTROABS 6.4  --   --  4,128.00 4,899.00  HGB 13.8 13.9 13.9 13.3 13.0  HCT 40.6 40.0 40 38 38  MCV 93.3 93.0  --   --   --   PLT 232 219 187 212 237  Lab Results  Component Value Date   TSH 1.54 10/25/2020   Lab Results  Component Value Date   HGBA1C 5.6 09/13/2014   Lab Results  Component Value Date   CHOL 111 12/11/2016   HDL 66 12/11/2016   LDLCALC 31 12/11/2016   TRIG 71 12/11/2016   CHOLHDL 1.7 12/11/2016    Significant Diagnostic Results in last 30 days:  No results found.  Assessment/Plan 1. Rib pain on left side - unwitnessed fall this morning, left lateral pain and pain under left breast - no bruising or skin breakdown - pain with inspiration - CXR - cont norco 5/325 mg q6hrs prn   Family/ staff Communication: plan discussed with patient and nurse  Labs/tests ordered:  CXR

## 2021-03-16 DIAGNOSIS — R2681 Unsteadiness on feet: Secondary | ICD-10-CM | POA: Diagnosis not present

## 2021-03-16 DIAGNOSIS — M6281 Muscle weakness (generalized): Secondary | ICD-10-CM | POA: Diagnosis not present

## 2021-03-16 DIAGNOSIS — R29898 Other symptoms and signs involving the musculoskeletal system: Secondary | ICD-10-CM | POA: Diagnosis not present

## 2021-03-16 DIAGNOSIS — Z9181 History of falling: Secondary | ICD-10-CM | POA: Diagnosis not present

## 2021-03-16 DIAGNOSIS — G629 Polyneuropathy, unspecified: Secondary | ICD-10-CM | POA: Diagnosis not present

## 2021-03-17 ENCOUNTER — Non-Acute Institutional Stay (SKILLED_NURSING_FACILITY): Payer: Medicare Other | Admitting: Orthopedic Surgery

## 2021-03-17 ENCOUNTER — Encounter: Payer: Self-pay | Admitting: Orthopedic Surgery

## 2021-03-17 DIAGNOSIS — G63 Polyneuropathy in diseases classified elsewhere: Secondary | ICD-10-CM | POA: Diagnosis not present

## 2021-03-17 DIAGNOSIS — R251 Tremor, unspecified: Secondary | ICD-10-CM

## 2021-03-17 DIAGNOSIS — F0150 Vascular dementia without behavioral disturbance: Secondary | ICD-10-CM

## 2021-03-17 DIAGNOSIS — K219 Gastro-esophageal reflux disease without esophagitis: Secondary | ICD-10-CM

## 2021-03-17 DIAGNOSIS — Z9181 History of falling: Secondary | ICD-10-CM | POA: Diagnosis not present

## 2021-03-17 DIAGNOSIS — I4891 Unspecified atrial fibrillation: Secondary | ICD-10-CM | POA: Diagnosis not present

## 2021-03-17 DIAGNOSIS — R627 Adult failure to thrive: Secondary | ICD-10-CM

## 2021-03-17 DIAGNOSIS — E871 Hypo-osmolality and hyponatremia: Secondary | ICD-10-CM

## 2021-03-17 DIAGNOSIS — K529 Noninfective gastroenteritis and colitis, unspecified: Secondary | ICD-10-CM | POA: Diagnosis not present

## 2021-03-17 DIAGNOSIS — M6281 Muscle weakness (generalized): Secondary | ICD-10-CM | POA: Diagnosis not present

## 2021-03-17 DIAGNOSIS — G629 Polyneuropathy, unspecified: Secondary | ICD-10-CM | POA: Diagnosis not present

## 2021-03-17 DIAGNOSIS — R29898 Other symptoms and signs involving the musculoskeletal system: Secondary | ICD-10-CM | POA: Diagnosis not present

## 2021-03-17 DIAGNOSIS — I38 Endocarditis, valve unspecified: Secondary | ICD-10-CM

## 2021-03-17 DIAGNOSIS — R2681 Unsteadiness on feet: Secondary | ICD-10-CM | POA: Diagnosis not present

## 2021-03-17 NOTE — Progress Notes (Signed)
Location:   [facility] Room Number: N20 Place of Service:  SNF (31) Provider:  Windell Moulding, NP    Patient Care Team: Virgie Dad, MD as PCP - General (Internal Medicine) Troy Sine, MD as PCP - Cardiology (Cardiology) Troy Sine, MD as Consulting Physician (Cardiology)  Extended Emergency Contact Information Primary Emergency Contact: Dudziak,Bill Address: [address]          [place], [place] [address] Johnnette Litter of [place] Phone: [phone] Relation: Son Secondary Emergency Contact: [place] Mobile Phone: [phone] Relation: Daughter  Code Status:  DNR Goals of care: Advanced Directive information Advanced Directives 03/17/2021  Does Patient Have a Medical Advance Directive? Yes  Type of Paramedic of [place];Living will;Out of facility DNR (pink MOST or yellow form)  Does patient want to make changes to medical advance directive? No - Patient declined  Copy of [place] in Chart? Yes - validated most recent copy scanned in chart (See row information)  Would patient like information on creating a medical advance directive? -  Pre-existing out of facility DNR order (yellow form or pink MOST form) Pink MOST form placed in chart (order not valid for inpatient use)     Chief Complaint  Patient presents with   Medical Management of Chronic Issues    Routine follow up    Health Maintenance    Discuss need for DEXA scan and PNA vaccine    HPI:  Pt is a 87 y.o. female seen today for medical management of chronic diseases.    She currently resides on the skilled unit at [facility]. Past medical history includes: heart failure, atrial fib, HTN, NSTEMI, c.diff, GERD, vascular dementia, depression, and weakness.  Valvular heart disease- noted to have severe MR, TR and aortic stenosis. She is sometimes sob with exertion. She saw cardiology 07/26. Aortic stenosis murmur noted to  be more prominent. Advised to f/u in 6 months. No changes to current treatment.  Atrial fibrillation- TSH 1.54 10/25/2020, BUN/creat 16/0.5 11/10/2020, amiodarone 100 mg QOD, she is not on anticoagulant due to advanced age and falls risk.  Chronic diarrhea/ hx: c.diff- LBM today, no recent diarrhea, colestipol 1g bid Polyneuropathy- gabapentin 900 mg daily and 300 mg tid GERD- does not recall food triggers, protonix 40 mg daily, pepcid 20 mg daily, hgb 13.0 11/10/2020 Tremors- still complains of intermittent resting hand tremor, appears to be more prominent after therapy, she is able to eat breakfast without difficulty Hyponatremia- Na 133 03/10/21, asymptomatic Vascular dementia- no behavioral outbursts, very pleasant, follows commands and can engage in conversation appropriately  Reported fall 07/27 while ambulating to bathroom. She complained of left sides chest pain after incident. CXR negative for acute fracture. Pain improved with prn hydrocodone. She is working with PT/OT 3x/week to improve strength, balance and gait. Continues to ambulate with walker.   Recent blood pressures:  07/29- 121/64  07/27- 121/72, 141/80  07/26- 136/64  Recent weights:  07/01- 92.6 lbs  06/01- 95.1 lbs  05/03- 95.5 lbs  Nurse does not report any concerns, vitals stable.      Past Medical History:  Diagnosis Date   Abnormality of gait 09/07/2015   Anemia, iron deficiency 09/16/2014   Anxiety    Arthritis    "hands and feet" (12/06/2017)   CAD (coronary artery disease)    a. 08/2014 NSTEMI/Cath: mild Ca2+ or RCA ostium, otw nl cors. CO 3.3 L/min (thermo), 3.7 L/min (Fick).  Carotid arterial disease ([place]) 07/30/2012   carotid doppler; normal study   CHF (congestive heart failure) (HCC)    Chronic anticoagulation, with coumadin, with PAF and CHADS2Vasc2 score of 4 09/16/2014   Chronic back pain    "all over" (0000000)   Complication of anesthesia    "they had hard time waking me up" (12/06/2017)    Degenerative arthritis    Diverticulosis    Essential hypertension    GERD (gastroesophageal reflux disease)    History of blood transfusion 12/06/2017   History of hiatal hernia    History of nuclear stress test 09/19/2007   normal pattern of perfusion; post-stress EF 86%; EKG negative for ischemia; low risk scan   Hyperlipidemia    Left carotid bruit    a. 07/2015 Carotid U/S: 1-39% bilat ICA stenosis.   Memory disorder 03/05/2014   Mild renal insufficiency    Mitral prolapse    Neuropathy    "hands and feet" (12/06/2017)   PAF (paroxysmal atrial fibrillation) ([place])    a. 08/2014-->Coumadin (CHA2DS2VASc = 4).   Peripheral neuropathy    Small fiber    Pre-syncope    a. 04/2015 in setting of bradycardia-->CCB/BB doses adjusted.   Pulmonary hypertension (HCC)    Seasonal allergies    Skin cancer    "burned off my face" (12/06/2017)   Valvular heart disease    a. 04/2015 Echo: EF 65-70%, no rwma, Gr1 DD, mild AS, triv AI, mild MS, mod MR, mod dil LA, mod TR, sev increased PASP.   Past Surgical History:  Procedure Laterality Date   ABDOMINAL HYSTERECTOMY     APPENDECTOMY     BUNIONECTOMY Bilateral    CARPAL TUNNEL RELEASE Left    CHOLECYSTECTOMY OPEN     DILATION AND CURETTAGE OF UTERUS     ESOPHAGOGASTRODUODENOSCOPY N/A 12/10/2017   Procedure: ESOPHAGOGASTRODUODENOSCOPY (EGD);  Surgeon: Clarene Essex, MD;  Location: [facility];  Service: Endoscopy;  Laterality: N/A;   FERTILITY SURGERY     resuspension procedure    FOOT FRACTURE SURGERY Left    FRACTURE SURGERY     HIP ARTHROPLASTY Left 05/02/2019   Procedure: ARTHROPLASTY BIPOLAR HIP (HEMIARTHROPLASTY);  Surgeon: Marybelle Killings, MD;  Location: [facility];  Service: Orthopedics;  Laterality: Left;   JOINT REPLACEMENT     LEFT HEART CATHETERIZATION WITH CORONARY ANGIOGRAM N/A 09/14/2014   Procedure: LEFT HEART CATHETERIZATION WITH CORONARY ANGIOGRAM;  Surgeon: Troy Sine, MD;  Location: [facility] C[facility] LAB;  Service: Cardiovascular;   Laterality: N/A;   REPLACEMENT TOTAL KNEE Right 03/2008   Archie Endo 12/19/2010   ROBOTIC ASSISTED BILATERAL SALPINGO OOPHERECTOMY Bilateral 02/19/2017   Procedure: XI ROBOTIC ASSISTED BILATERAL SALPINGO OOPHORECTOMY AND LYSIS OF ADHESION;  Surgeon: Everitt Amber, MD;  Location: [facility];  Service: Gynecology;  Laterality: Bilateral;   TEAR DUCT PROBING Left    TONSILLECTOMY     UMBILICAL HERNIA REPAIR      Allergies  Allergen Reactions   Avelox [Moxifloxacin] Shortness Of Breath and Swelling   Aricept [Donepezil Hcl] Diarrhea   Codeine Swelling        Donepezil Diarrhea   Garlic Diarrhea    severe   Latex Hives, Itching, Rash and Other (See Comments)    Watery blisters    Onion Diarrhea    severe   Sulfa Drugs Cross Reactors Swelling   Cats Claw (Uncaria Tomentosa) Swelling   Cymbalta [Duloxetine Hcl]    Dust Mite Extract    Lactose Intolerance (Gi) Diarrhea   Lidoderm [Lidocaine]  Molds & Smuts    Duloxetine Rash   Polysporin [Bacitracin-Polymyxin B] Other (See Comments)    unknown    Allergies as of 03/17/2021       Reactions   Avelox [moxifloxacin] Shortness Of Breath, Swelling   Aricept [donepezil Hcl] Diarrhea   Codeine Swelling      Donepezil Diarrhea   Garlic Diarrhea   severe   Latex Hives, Itching, Rash, Other (See Comments)   Watery blisters    Onion Diarrhea   severe   Sulfa Drugs Cross Reactors Swelling   Cats Claw (uncaria Tomentosa) Swelling   Cymbalta [duloxetine Hcl]    Dust Mite Extract    Lactose Intolerance (gi) Diarrhea   Lidoderm [lidocaine]    Molds & Smuts    Duloxetine Rash   Polysporin [bacitracin-polymyxin B] Other (See Comments)   unknown        Medication List        Accurate as of March 17, 2021 10:16 AM. If you have any questions, ask your nurse or doctor.          acetaminophen 500 MG tablet Commonly known as: TYLENOL Take 500 mg by mouth 4 (four) times daily.   amiodarone 100 MG tablet Commonly known as:  PACERONE Take 1 tablet (100 mg total) by mouth every other day.   B-12 1000 MCG Tabs Take 1,000 mcg by mouth daily.   Ben Gay Ultra Strength 11-27-28 % Crea Generic drug: Camphor-Menthol-Methyl Sal Apply 1 application topically 4 (four) times daily as needed. Apply to right hip and bilateral knees   calcium carbonate 1250 (500 Ca) MG chewable tablet Commonly known as: OS-CAL Chew 1 tablet by mouth daily.   cetirizine 10 MG tablet Commonly known as: ZYRTEC Take 10 mg by mouth daily with breakfast.   cholecalciferol 1000 units tablet Commonly known as: VITAMIN D Take 2,000 Units by mouth daily.   colestipol 1 g tablet Commonly known as: COLESTID Take 1 g by mouth 2 (two) times daily.   cycloSPORINE 0.05 % ophthalmic emulsion Commonly known as: RESTASIS Place 1 drop into both eyes 2 (two) times daily.   dextromethorphan-guaiFENesin 30-600 MG 12hr tablet Commonly known as: MUCINEX DM Take 1 tablet by mouth 2 (two) times daily.   famotidine 20 MG tablet Commonly known as: PEPCID Take 20 mg by mouth daily.   fluticasone 50 MCG/ACT nasal spray Commonly known as: FLONASE Place 1 spray into both nostrils daily.   gabapentin 300 MG capsule Commonly known as: NEURONTIN Take 900 mg by mouth daily.   gabapentin 100 MG capsule Commonly known as: NEURONTIN Take 300 mg by mouth 3 (three) times daily. Take 3 to = 300 mg TID   HYDROcodone-acetaminophen 5-325 MG tablet Commonly known as: NORCO/VICODIN Take 1 tablet by mouth every 6 (six) hours as needed for moderate pain.   levETIRAcetam 250 MG tablet Commonly known as: KEPPRA Take 125 mg by mouth every morning.   levETIRAcetam 250 MG tablet Commonly known as: KEPPRA Take 500 mg by mouth daily.   loperamide 2 MG tablet Commonly known as: IMODIUM A-D Take 2 mg by mouth 4 (four) times daily as needed for diarrhea or loose stools.   mirtazapine 7.5 MG tablet Commonly known as: REMERON Take 7.5 mg by mouth at bedtime.    multivitamin with minerals tablet Take 1 tablet by mouth daily.   ondansetron 4 MG tablet Commonly known as: ZOFRAN Take 1 tablet (4 mg total) by mouth every 8 (eight) hours as needed for  up to 15 doses for nausea or vomiting.   pantoprazole 40 MG tablet Commonly known as: PROTONIX Take 40 mg by mouth at bedtime.   Rewetting Drops Soln by Does not apply route every 6 (six) hours as needed.        Review of Systems  Constitutional:  Negative for activity change, appetite change, fatigue and fever.  HENT:  Negative for hearing loss and trouble swallowing.   Eyes:  Negative for visual disturbance.       Glasses  Respiratory:  Positive for shortness of breath. Negative for cough.        Sob with exertion  Cardiovascular:  Negative for chest pain and leg swelling.  Gastrointestinal:  Negative for abdominal distention, abdominal pain, constipation, diarrhea and nausea.  Genitourinary:  Positive for frequency. Negative for dysuria and hematuria.  Musculoskeletal:  Positive for gait problem.       Left sided pain post fall 07/27  Skin: Negative.   Neurological:  Positive for weakness. Negative for dizziness and headaches.  Psychiatric/Behavioral:  Positive for confusion. Negative for dysphoric mood and sleep disturbance. The patient is not nervous/anxious.    Immunization History  Administered Date(s) Administered   Influenza, High Dose Seasonal PF 05/23/2015, 06/04/2016, 06/13/2017   Influenza, Quadrivalent, Recombinant, Inj, Pf 06/05/2018   Influenza-Unspecified 05/01/2012, 05/20/2013, 06/20/2018, 05/11/2020   Moderna Sars-Covid-2 Vaccination 08/24/2019, 09/21/2019, 07/04/2020   Pneumococcal Polysaccharide-23 03/19/2002   Tdap 01/08/2011, 11/20/2011   Zoster Recombinat (Shingrix) 02/13/2018, 06/05/2018   Zoster, Live 09/20/2007   Pertinent  Health Maintenance Due  Topic Date Due   DEXA SCAN  Never done   PNA vac Low Risk Adult (2 of 2 - PCV13) 03/20/2003   INFLUENZA  VACCINE  03/20/2021   Fall Risk  03/08/2021 04/17/2018 01/27/2016  Falls in the past year? 0 Yes No  Number falls in past yr: 0 1 -  Injury with Fall? 0 Yes -  Risk for fall due to : History of fall(s);Impaired balance/gait;Impaired mobility Impaired balance/gait -  Follow up Falls evaluation completed;Education provided;Falls prevention discussed - -   Functional Status Survey:    Vitals:   03/17/21 1009  BP: 121/64  Pulse: 62  Resp: 19  Temp: (!) 97.3 F (36.3 C)  SpO2: 96%  Weight: 92 lb 9.6 oz (42 kg)  Height: '5\' 2"'$  (1.575 m)   Body mass index is 16.94 kg/m. Physical Exam Vitals reviewed.  Constitutional:      General: She is not in acute distress.    Comments: Temporal wasting  HENT:     Head: Normocephalic.     Right Ear: There is no impacted cerumen.     Left Ear: There is no impacted cerumen.     Nose: Nose normal.     Mouth/Throat:     Mouth: Mucous membranes are moist.  Eyes:     General:        Right eye: No discharge.        Left eye: No discharge.  Cardiovascular:     Rate and Rhythm: Normal rate. Rhythm irregular.     Pulses: Normal pulses.     Heart sounds: Murmur heard.  Pulmonary:     Effort: Pulmonary effort is normal. No respiratory distress.     Breath sounds: Normal breath sounds. No wheezing.  Abdominal:     General: Bowel sounds are normal. There is no distension.     Palpations: Abdomen is soft.     Tenderness: There is no abdominal tenderness.  Musculoskeletal:     Cervical back: Normal range of motion.     Right lower leg: No edema.     Left lower leg: No edema.  Lymphadenopathy:     Cervical: No cervical adenopathy.  Skin:    General: Skin is warm and dry.     Capillary Refill: Capillary refill takes less than 2 seconds.  Neurological:     General: No focal deficit present.     Mental Status: She is alert. Mental status is at baseline.     Motor: Weakness present.     Gait: Gait abnormal.     Comments: walker  Psychiatric:         Mood and Affect: Mood normal.        Behavior: Behavior normal.        Cognition and Memory: Memory is impaired.    Labs reviewed: Recent Labs    04/09/20 0919 05/24/20 0914 06/02/20 0000 10/25/20 0000 11/10/20 0000  NA 132* 129* 137 132* 137  K 3.8 3.6 3.9 4.2 4.0  CL 96* 94* 99 95* 98*  CO2 27 23 31* 28* 27*  GLUCOSE 96 97  --   --   --   BUN '13 11 12 20 16  '$ CREATININE 0.53 0.64 0.7 0.6 0.5  CALCIUM 9.1 9.4 9.3 9.3 9.2   Recent Labs    04/09/20 0919 06/02/20 0000 10/25/20 0000 11/10/20 0000  AST '21 17 13 14  '$ ALT 13 10 6* 9  ALKPHOS 60 51 54 57  BILITOT 0.7  --   --   --   PROT 7.1  --   --   --   ALBUMIN 3.6 3.7 3.7 3.6   Recent Labs    04/09/20 0919 05/24/20 0914 06/02/20 0000 10/25/20 0000 11/10/20 0000  WBC 8.4 7.1 5.6 6.8 7.4  NEUTROABS 6.4  --   --  4,128.00 4,899.00  HGB 13.8 13.9 13.9 13.3 13.0  HCT 40.6 40.0 40 38 38  MCV 93.3 93.0  --   --   --   PLT 232 219 187 212 237   Lab Results  Component Value Date   TSH 1.54 10/25/2020   Lab Results  Component Value Date   HGBA1C 5.6 09/13/2014   Lab Results  Component Value Date   CHOL 111 12/11/2016   HDL 66 12/11/2016   LDLCALC 31 12/11/2016   TRIG 71 12/11/2016   CHOLHDL 1.7 12/11/2016    Significant Diagnostic Results in last 30 days:  No results found.  Assessment/Plan 1. Valvular heart disease - followed by cardiology for MR, TR and severe aortic stenosis- murmur noted to be more prominent - some sob with exertion - recommended f/u in 6 months  2. Atrial fibrillation with RVR (HCC) - rate controlled with amiodarone  - anticoagulation not recommended due to advanced age and falls risk  3. Chronic diarrhea - stable with colestipol bid  4. Polyneuropathy in other diseases classified elsewhere ([place]) - cont gabapentin 900 mg daily - cont gabapentin 300 mg tid  5. Gastroesophageal reflux disease without esophagitis - denies increased reflux - cont pepcid daily - cont  protonix daily  6. Tremor of both hands - noted at rest - she is able to eat without difficulty - she is able to grab and hold things like her walker, tv remote - cont PT/OT  7. Hyponatremia - Na 133 03/10/2021 - asymptomatic  8. Vascular dementia without behavioral disturbance (HCC) - no recent behavioral out bursts -  cont skilled nursing care  9. Adult failure to thrive - continues to lose weight - appears frail with some temporal wasting - cont remeron  - cont skilled nursing care    Family/ staff Communication: plan discussed with patient and nurse  Labs/tests ordered: none

## 2021-03-20 DIAGNOSIS — R2681 Unsteadiness on feet: Secondary | ICD-10-CM | POA: Diagnosis not present

## 2021-03-20 DIAGNOSIS — E2740 Unspecified adrenocortical insufficiency: Secondary | ICD-10-CM | POA: Diagnosis not present

## 2021-03-20 DIAGNOSIS — I272 Pulmonary hypertension, unspecified: Secondary | ICD-10-CM | POA: Diagnosis not present

## 2021-03-20 DIAGNOSIS — M1990 Unspecified osteoarthritis, unspecified site: Secondary | ICD-10-CM | POA: Diagnosis not present

## 2021-03-20 DIAGNOSIS — G9009 Other idiopathic peripheral autonomic neuropathy: Secondary | ICD-10-CM | POA: Diagnosis not present

## 2021-03-20 DIAGNOSIS — I5032 Chronic diastolic (congestive) heart failure: Secondary | ICD-10-CM | POA: Diagnosis not present

## 2021-03-20 DIAGNOSIS — E785 Hyperlipidemia, unspecified: Secondary | ICD-10-CM | POA: Diagnosis not present

## 2021-03-20 DIAGNOSIS — I480 Paroxysmal atrial fibrillation: Secondary | ICD-10-CM | POA: Diagnosis not present

## 2021-03-20 DIAGNOSIS — Z96651 Presence of right artificial knee joint: Secondary | ICD-10-CM | POA: Diagnosis not present

## 2021-03-20 DIAGNOSIS — Z9181 History of falling: Secondary | ICD-10-CM | POA: Diagnosis not present

## 2021-03-20 DIAGNOSIS — I2510 Atherosclerotic heart disease of native coronary artery without angina pectoris: Secondary | ICD-10-CM | POA: Diagnosis not present

## 2021-03-20 DIAGNOSIS — K219 Gastro-esophageal reflux disease without esophagitis: Secondary | ICD-10-CM | POA: Diagnosis not present

## 2021-03-20 DIAGNOSIS — Z7901 Long term (current) use of anticoagulants: Secondary | ICD-10-CM | POA: Diagnosis not present

## 2021-03-20 DIAGNOSIS — R41841 Cognitive communication deficit: Secondary | ICD-10-CM | POA: Diagnosis not present

## 2021-03-20 DIAGNOSIS — M6281 Muscle weakness (generalized): Secondary | ICD-10-CM | POA: Diagnosis not present

## 2021-03-20 DIAGNOSIS — I10 Essential (primary) hypertension: Secondary | ICD-10-CM | POA: Diagnosis not present

## 2021-03-20 DIAGNOSIS — R2689 Other abnormalities of gait and mobility: Secondary | ICD-10-CM | POA: Diagnosis not present

## 2021-03-20 DIAGNOSIS — G629 Polyneuropathy, unspecified: Secondary | ICD-10-CM | POA: Diagnosis not present

## 2021-03-20 DIAGNOSIS — I252 Old myocardial infarction: Secondary | ICD-10-CM | POA: Diagnosis not present

## 2021-03-20 DIAGNOSIS — I341 Nonrheumatic mitral (valve) prolapse: Secondary | ICD-10-CM | POA: Diagnosis not present

## 2021-03-21 DIAGNOSIS — I10 Essential (primary) hypertension: Secondary | ICD-10-CM | POA: Diagnosis not present

## 2021-03-21 DIAGNOSIS — I2510 Atherosclerotic heart disease of native coronary artery without angina pectoris: Secondary | ICD-10-CM | POA: Diagnosis not present

## 2021-03-21 DIAGNOSIS — I480 Paroxysmal atrial fibrillation: Secondary | ICD-10-CM | POA: Diagnosis not present

## 2021-03-21 DIAGNOSIS — Z7901 Long term (current) use of anticoagulants: Secondary | ICD-10-CM | POA: Diagnosis not present

## 2021-03-21 DIAGNOSIS — K219 Gastro-esophageal reflux disease without esophagitis: Secondary | ICD-10-CM | POA: Diagnosis not present

## 2021-03-21 DIAGNOSIS — I272 Pulmonary hypertension, unspecified: Secondary | ICD-10-CM | POA: Diagnosis not present

## 2021-03-22 DIAGNOSIS — I480 Paroxysmal atrial fibrillation: Secondary | ICD-10-CM | POA: Diagnosis not present

## 2021-03-22 DIAGNOSIS — I272 Pulmonary hypertension, unspecified: Secondary | ICD-10-CM | POA: Diagnosis not present

## 2021-03-22 DIAGNOSIS — K219 Gastro-esophageal reflux disease without esophagitis: Secondary | ICD-10-CM | POA: Diagnosis not present

## 2021-03-22 DIAGNOSIS — Z7901 Long term (current) use of anticoagulants: Secondary | ICD-10-CM | POA: Diagnosis not present

## 2021-03-22 DIAGNOSIS — I2510 Atherosclerotic heart disease of native coronary artery without angina pectoris: Secondary | ICD-10-CM | POA: Diagnosis not present

## 2021-03-22 DIAGNOSIS — I10 Essential (primary) hypertension: Secondary | ICD-10-CM | POA: Diagnosis not present

## 2021-03-23 DIAGNOSIS — L814 Other melanin hyperpigmentation: Secondary | ICD-10-CM | POA: Diagnosis not present

## 2021-03-23 DIAGNOSIS — I10 Essential (primary) hypertension: Secondary | ICD-10-CM | POA: Diagnosis not present

## 2021-03-23 DIAGNOSIS — I480 Paroxysmal atrial fibrillation: Secondary | ICD-10-CM | POA: Diagnosis not present

## 2021-03-23 DIAGNOSIS — I272 Pulmonary hypertension, unspecified: Secondary | ICD-10-CM | POA: Diagnosis not present

## 2021-03-23 DIAGNOSIS — L821 Other seborrheic keratosis: Secondary | ICD-10-CM | POA: Diagnosis not present

## 2021-03-23 DIAGNOSIS — I2510 Atherosclerotic heart disease of native coronary artery without angina pectoris: Secondary | ICD-10-CM | POA: Diagnosis not present

## 2021-03-23 DIAGNOSIS — Z85828 Personal history of other malignant neoplasm of skin: Secondary | ICD-10-CM | POA: Diagnosis not present

## 2021-03-23 DIAGNOSIS — L570 Actinic keratosis: Secondary | ICD-10-CM | POA: Diagnosis not present

## 2021-03-23 DIAGNOSIS — Z7901 Long term (current) use of anticoagulants: Secondary | ICD-10-CM | POA: Diagnosis not present

## 2021-03-23 DIAGNOSIS — K219 Gastro-esophageal reflux disease without esophagitis: Secondary | ICD-10-CM | POA: Diagnosis not present

## 2021-03-24 DIAGNOSIS — K219 Gastro-esophageal reflux disease without esophagitis: Secondary | ICD-10-CM | POA: Diagnosis not present

## 2021-03-24 DIAGNOSIS — Z7901 Long term (current) use of anticoagulants: Secondary | ICD-10-CM | POA: Diagnosis not present

## 2021-03-24 DIAGNOSIS — I2510 Atherosclerotic heart disease of native coronary artery without angina pectoris: Secondary | ICD-10-CM | POA: Diagnosis not present

## 2021-03-24 DIAGNOSIS — I10 Essential (primary) hypertension: Secondary | ICD-10-CM | POA: Diagnosis not present

## 2021-03-24 DIAGNOSIS — I480 Paroxysmal atrial fibrillation: Secondary | ICD-10-CM | POA: Diagnosis not present

## 2021-03-24 DIAGNOSIS — I272 Pulmonary hypertension, unspecified: Secondary | ICD-10-CM | POA: Diagnosis not present

## 2021-03-27 DIAGNOSIS — Z7901 Long term (current) use of anticoagulants: Secondary | ICD-10-CM | POA: Diagnosis not present

## 2021-03-27 DIAGNOSIS — I10 Essential (primary) hypertension: Secondary | ICD-10-CM | POA: Diagnosis not present

## 2021-03-27 DIAGNOSIS — I480 Paroxysmal atrial fibrillation: Secondary | ICD-10-CM | POA: Diagnosis not present

## 2021-03-27 DIAGNOSIS — K219 Gastro-esophageal reflux disease without esophagitis: Secondary | ICD-10-CM | POA: Diagnosis not present

## 2021-03-27 DIAGNOSIS — I272 Pulmonary hypertension, unspecified: Secondary | ICD-10-CM | POA: Diagnosis not present

## 2021-03-27 DIAGNOSIS — I2510 Atherosclerotic heart disease of native coronary artery without angina pectoris: Secondary | ICD-10-CM | POA: Diagnosis not present

## 2021-03-28 DIAGNOSIS — Z7901 Long term (current) use of anticoagulants: Secondary | ICD-10-CM | POA: Diagnosis not present

## 2021-03-28 DIAGNOSIS — I2510 Atherosclerotic heart disease of native coronary artery without angina pectoris: Secondary | ICD-10-CM | POA: Diagnosis not present

## 2021-03-28 DIAGNOSIS — I10 Essential (primary) hypertension: Secondary | ICD-10-CM | POA: Diagnosis not present

## 2021-03-28 DIAGNOSIS — K219 Gastro-esophageal reflux disease without esophagitis: Secondary | ICD-10-CM | POA: Diagnosis not present

## 2021-03-28 DIAGNOSIS — I272 Pulmonary hypertension, unspecified: Secondary | ICD-10-CM | POA: Diagnosis not present

## 2021-03-28 DIAGNOSIS — I480 Paroxysmal atrial fibrillation: Secondary | ICD-10-CM | POA: Diagnosis not present

## 2021-03-29 ENCOUNTER — Encounter: Payer: Self-pay | Admitting: Nurse Practitioner

## 2021-03-29 ENCOUNTER — Non-Acute Institutional Stay (SKILLED_NURSING_FACILITY): Payer: Medicare Other | Admitting: Nurse Practitioner

## 2021-03-29 DIAGNOSIS — E871 Hypo-osmolality and hyponatremia: Secondary | ICD-10-CM

## 2021-03-29 DIAGNOSIS — I4891 Unspecified atrial fibrillation: Secondary | ICD-10-CM

## 2021-03-29 DIAGNOSIS — R627 Adult failure to thrive: Secondary | ICD-10-CM | POA: Diagnosis not present

## 2021-03-29 DIAGNOSIS — E782 Mixed hyperlipidemia: Secondary | ICD-10-CM | POA: Diagnosis not present

## 2021-03-29 DIAGNOSIS — K529 Noninfective gastroenteritis and colitis, unspecified: Secondary | ICD-10-CM

## 2021-03-29 DIAGNOSIS — D649 Anemia, unspecified: Secondary | ICD-10-CM | POA: Diagnosis not present

## 2021-03-29 DIAGNOSIS — F339 Major depressive disorder, recurrent, unspecified: Secondary | ICD-10-CM

## 2021-03-29 DIAGNOSIS — N390 Urinary tract infection, site not specified: Secondary | ICD-10-CM | POA: Diagnosis not present

## 2021-03-29 DIAGNOSIS — F0150 Vascular dementia without behavioral disturbance: Secondary | ICD-10-CM

## 2021-03-29 DIAGNOSIS — R350 Frequency of micturition: Secondary | ICD-10-CM | POA: Diagnosis not present

## 2021-03-29 DIAGNOSIS — D508 Other iron deficiency anemias: Secondary | ICD-10-CM

## 2021-03-29 DIAGNOSIS — G63 Polyneuropathy in diseases classified elsewhere: Secondary | ICD-10-CM | POA: Diagnosis not present

## 2021-03-29 DIAGNOSIS — I38 Endocarditis, valve unspecified: Secondary | ICD-10-CM

## 2021-03-29 DIAGNOSIS — K219 Gastro-esophageal reflux disease without esophagitis: Secondary | ICD-10-CM

## 2021-03-29 DIAGNOSIS — R531 Weakness: Secondary | ICD-10-CM | POA: Diagnosis not present

## 2021-03-29 NOTE — Assessment & Plan Note (Signed)
generalized weakness, stated her left side chest wall pain is better, negative X-ray for fx 03/15/21, denied cough, palpitation, abd pain, nausea, vomiting, or fever. Update COVID, CBC/diff, CMP/eGFR, UA C/S

## 2021-03-29 NOTE — Assessment & Plan Note (Signed)
stable, takes Mirtazapine. TSH 1.54 10/24/20

## 2021-03-29 NOTE — Assessment & Plan Note (Signed)
Urinary frequency, chronic, denied dysuria

## 2021-03-29 NOTE — Assessment & Plan Note (Signed)
low body weight, but stable, on Mirtazapine. don't thinks DEXA is needed presently.

## 2021-03-29 NOTE — Progress Notes (Signed)
Location:   [facility] Room Number: N20 Place of Service:  SNF (31) Provider:  Doren Kaspar Otho Darner, NP  Virgie Dad, MD  Patient Care Team: Virgie Dad, MD as PCP - General (Internal Medicine) Troy Sine, MD as PCP - Cardiology (Cardiology) Troy Sine, MD as Consulting Physician (Cardiology)  Extended Emergency Contact Information Primary Emergency Contact: Quiles,Bill Address: [address]          [place],  [address] Johnnette Litter of [place] Phone: [phone] Relation: Son Secondary Emergency Contact: [place] Phone: [phone] Relation: Daughter  Code Status:   Goals of care: Advanced Directive information Advanced Directives 03/29/2021  Does Patient Have a Medical Advance Directive? Yes  Type of Paramedic of [place];Living will;Out of facility DNR (pink MOST or yellow form)  Does patient want to make changes to medical advance directive? No - Patient declined  Copy of [place] in Chart? Yes - validated most recent copy scanned in chart (See row information)  Would patient like information on creating a medical advance directive? -  Pre-existing out of facility DNR order (yellow form or pink MOST form) Pink MOST form placed in chart (order not valid for inpatient use)     Chief Complaint  Patient presents with   Acute Visit    Patient presents for fatigue and increased confusion.     HPI:  Pt is a 87 y.o. female seen today for an acute visit for generalized weakness, stated her left side chest wall pain is better, negative X-ray for fx 03/15/21, denied cough, palpitation, abd pain, nausea, vomiting, or fever.    Chronic diarrhea, stable presently, Hx C-diff, takes Colestipol, f/u GI             Urinary frequency, chronic, denied dysuria             Afib, heart rate is in control, takes Amiodarone, not anticoagulated due to hx of GI Bleed.             Peripheral  neuropathy, takes Gabapentin, Keppra, Tylenol.             IDA Hgb 12.3 03/15/21,  off Iron             Hyperlipidemia LDL 66 in the past.              Memory deficict, off Mamentine.             Depression, stable, takes Mirtazapine. TSH 1.54 10/24/20             Hyponatremia, Na 13 03/15/21             FTT, low body weight, but stable, on Mirtazapine. don't thinks DEXA is needed presently.              GERD/Hx of GI bleed, takes Pantoprazole, Famotdine. Hgb 12.3 03/15/21             Valvular heart disease, stable. Moderate AS, MR, TR- severe LAE, normal LVF-Oct 2019     Past Medical History:  Diagnosis Date   Abnormality of gait 09/07/2015   Anemia, iron deficiency 09/16/2014   Anxiety    Arthritis    "hands and feet" (12/06/2017)   CAD (coronary artery disease)    a. 08/2014 NSTEMI/Cath: mild Ca2+ or RCA ostium, otw nl cors. CO 3.3 L/min (thermo), 3.7 L/min (Fick).   Carotid arterial disease ([facility]) 07/30/2012   carotid doppler; normal study  CHF (congestive heart failure) (HCC)    Chronic anticoagulation, with coumadin, with PAF and CHADS2Vasc2 score of 4 09/16/2014   Chronic back pain    "all over" (3/32/9518)   Complication of anesthesia    "they had hard time waking me up" (12/06/2017)   Degenerative arthritis    Diverticulosis    Essential hypertension    GERD (gastroesophageal reflux disease)    History of blood transfusion 12/06/2017   History of hiatal hernia    History of nuclear stress test 09/19/2007   normal pattern of perfusion; post-stress EF 86%; EKG negative for ischemia; low risk scan   Hyperlipidemia    Left carotid bruit    a. 07/2015 Carotid U/S: 1-39% bilat ICA stenosis.   Memory disorder 03/05/2014   Mild renal insufficiency    Mitral prolapse    Neuropathy    "hands and feet" (12/06/2017)   PAF (paroxysmal atrial fibrillation) ([facility])    a. 08/2014-->Coumadin (CHA2DS2VASc = 4).   Peripheral neuropathy    Small fiber    Pre-syncope    a. 04/2015 in setting of  bradycardia-->CCB/BB doses adjusted.   Pulmonary hypertension (HCC)    Seasonal allergies    Skin cancer    "burned off my face" (12/06/2017)   Valvular heart disease    a. 04/2015 Echo: EF 65-70%, no rwma, Gr1 DD, mild AS, triv AI, mild MS, mod MR, mod dil LA, mod TR, sev increased PASP.   Past Surgical History:  Procedure Laterality Date   ABDOMINAL HYSTERECTOMY     APPENDECTOMY     BUNIONECTOMY Bilateral    CARPAL TUNNEL RELEASE Left    CHOLECYSTECTOMY OPEN     DILATION AND CURETTAGE OF UTERUS     ESOPHAGOGASTRODUODENOSCOPY N/A 12/10/2017   Procedure: ESOPHAGOGASTRODUODENOSCOPY (EGD);  Surgeon: Clarene Essex, MD;  Location: [facility];  Service: Endoscopy;  Laterality: N/A;   FERTILITY SURGERY     resuspension procedure    FOOT FRACTURE SURGERY Left    FRACTURE SURGERY     HIP ARTHROPLASTY Left 05/02/2019   Procedure: ARTHROPLASTY BIPOLAR HIP (HEMIARTHROPLASTY);  Surgeon: Marybelle Killings, MD;  Location: [facility];  Service: Orthopedics;  Laterality: Left;   JOINT REPLACEMENT     LEFT HEART CATHETERIZATION WITH CORONARY ANGIOGRAM N/A 09/14/2014   Procedure: LEFT HEART CATHETERIZATION WITH CORONARY ANGIOGRAM;  Surgeon: Troy Sine, MD;  Location[facility] CATH [facility];  Service: Cardiovascular;  Laterality: N/A;   REPLACEMENT TOTAL KNEE Right 03/2008   Archie Endo 12/19/2010   ROBOTIC ASSISTED BILATERAL SALPINGO OOPHERECTOMY Bilateral 02/19/2017   Procedure: XI ROBOTIC ASSISTED BILATERAL SALPINGO OOPHORECTOMY AND LYSIS OF ADHESION;  Surgeon: Everitt Amber, MD;  Location: [facility];  Service: Gynecology;  Laterality: Bilateral;   TEAR DUCT PROBING Left    TONSILLECTOMY     UMBILICAL HERNIA REPAIR      Allergies  Allergen Reactions   Avelox [Moxifloxacin] Shortness Of Breath and Swelling   Aricept [Donepezil Hcl] Diarrhea   Codeine Swelling        Donepezil Diarrhea   Garlic Diarrhea    severe   Latex Hives, Itching, Rash and Other (See Comments)    Watery blisters    Onion Diarrhea    severe    Sulfa Drugs Cross Reactors Swelling   Cats Claw (Uncaria Tomentosa) Swelling   Cymbalta [Duloxetine Hcl]    Dust Mite Extract    Lactose Intolerance (Gi) Diarrhea   Lidoderm [Lidocaine]    Molds & Smuts    Duloxetine Rash   Polysporin [  Bacitracin-Polymyxin B] Other (See Comments)    unknown    Allergies as of 03/29/2021       Reactions   Avelox [moxifloxacin] Shortness Of Breath, Swelling   Aricept [donepezil Hcl] Diarrhea   Codeine Swelling      Donepezil Diarrhea   Garlic Diarrhea   severe   Latex Hives, Itching, Rash, Other (See Comments)   Watery blisters    Onion Diarrhea   severe   Sulfa Drugs Cross Reactors Swelling   Cats Claw (uncaria Tomentosa) Swelling   Cymbalta [duloxetine Hcl]    Dust Mite Extract    Lactose Intolerance (gi) Diarrhea   Lidoderm [lidocaine]    Molds & Smuts    Duloxetine Rash   Polysporin [bacitracin-polymyxin B] Other (See Comments)   unknown        Medication List        Accurate as of March 29, 2021 11:59 PM. If you have any questions, ask your nurse or doctor.          acetaminophen 500 MG tablet Commonly known as: TYLENOL Take 500 mg by mouth 4 (four) times daily.   amiodarone 100 MG tablet Commonly known as: PACERONE Take 1 tablet (100 mg total) by mouth every other day.   B-12 1000 MCG Tabs Take 1,000 mcg by mouth daily.   Ben Gay Ultra Strength 11-27-28 % Crea Generic drug: Camphor-Menthol-Methyl Sal Apply 1 application topically 4 (four) times daily as needed. Apply to right hip and bilateral knees   calcium carbonate 1250 (500 Ca) MG chewable tablet Commonly known as: OS-CAL Chew 1 tablet by mouth daily.   cetirizine 10 MG tablet Commonly known as: ZYRTEC Take 10 mg by mouth daily with breakfast.   cholecalciferol 1000 units tablet Commonly known as: VITAMIN D Take 2,000 Units by mouth daily.   colestipol 1 g tablet Commonly known as: COLESTID Take 1 g by mouth 2 (two) times daily.   cycloSPORINE  0.05 % ophthalmic emulsion Commonly known as: RESTASIS Place 1 drop into both eyes 2 (two) times daily.   dextromethorphan-guaiFENesin 30-600 MG 12hr tablet Commonly known as: MUCINEX DM Take 1 tablet by mouth 2 (two) times daily.   famotidine 20 MG tablet Commonly known as: PEPCID Take 20 mg by mouth daily.   fluticasone 50 MCG/ACT nasal spray Commonly known as: FLONASE Place 1 spray into both nostrils daily.   gabapentin 300 MG capsule Commonly known as: NEURONTIN Take 900 mg by mouth daily.   gabapentin 100 MG capsule Commonly known as: NEURONTIN Take 300 mg by mouth 3 (three) times daily. Take 3 to = 300 mg TID   HYDROcodone-acetaminophen 5-325 MG tablet Commonly known as: NORCO/VICODIN Take 1 tablet by mouth every 6 (six) hours as needed for moderate pain.   levETIRAcetam 250 MG tablet Commonly known as: KEPPRA Take 125 mg by mouth every morning.   levETIRAcetam 250 MG tablet Commonly known as: KEPPRA Take 500 mg by mouth daily.   loperamide 2 MG tablet Commonly known as: IMODIUM A-D Take 2 mg by mouth 4 (four) times daily as needed for diarrhea or loose stools.   mirtazapine 7.5 MG tablet Commonly known as: REMERON Take 7.5 mg by mouth at bedtime.   multivitamin with minerals tablet Take 1 tablet by mouth daily.   ondansetron 4 MG tablet Commonly known as: ZOFRAN Take 1 tablet (4 mg total) by mouth every 8 (eight) hours as needed for up to 15 doses for nausea or vomiting.   pantoprazole 40  MG tablet Commonly known as: PROTONIX Take 40 mg by mouth at bedtime.   Rewetting Drops Soln by Does not apply route every 6 (six) hours as needed.        Review of Systems  Constitutional:  Positive for activity change, appetite change and fatigue. Negative for fever.  HENT:  Positive for hearing loss. Negative for congestion, sore throat and voice change.   Eyes:  Negative for visual disturbance.  Respiratory:  Negative for cough.   Cardiovascular:   Positive for leg swelling. Negative for chest pain and palpitations.  Gastrointestinal:  Negative for abdominal pain, diarrhea, nausea and vomiting.  Genitourinary:  Negative for difficulty urinating, dysuria and urgency.  Musculoskeletal:  Positive for arthralgias and gait problem.       Positional lateral left chest wall pain x 3 days.   Skin:  Negative for color change.  Neurological:  Negative for speech difficulty, weakness and headaches.       Memory lapses.   Psychiatric/Behavioral:  Negative for behavioral problems and sleep disturbance. The patient is not nervous/anxious.    Immunization History  Administered Date(s) Administered   Influenza, High Dose Seasonal PF 05/23/2015, 06/04/2016, 06/13/2017   Influenza, Quadrivalent, Recombinant, Inj, Pf 06/05/2018   Influenza-Unspecified 05/01/2012, 05/20/2013, 06/20/2018, 05/11/2020   Moderna Sars-Covid-2 Vaccination 08/24/2019, 09/21/2019, 07/04/2020   Pneumococcal Polysaccharide-23 03/19/2002   Tdap 01/08/2011, 11/20/2011   Zoster Recombinat (Shingrix) 02/13/2018, 06/05/2018   Zoster, Live 09/20/2007   Pertinent  Health Maintenance Due  Topic Date Due   DEXA SCAN  Never done   PNA vac Low Risk Adult (2 of 2 - PCV13) 03/20/2003   INFLUENZA VACCINE  03/20/2021   Fall Risk  03/08/2021 04/17/2018 01/27/2016  Falls in the past year? 0 Yes No  Number falls in past yr: 0 1 -  Injury with Fall? 0 Yes -  Risk for fall due to : History of fall(s);Impaired balance/gait;Impaired mobility Impaired balance/gait -  Follow up Falls evaluation completed;Education provided;Falls prevention discussed - -   Functional Status Survey:    Vitals:   03/29/21 1106  BP: 123/73  Pulse: 60  Resp: 20  Temp: (!) 97.2 F (36.2 C)  SpO2: 95%  Height: _0  (1.575 m)   Body mass index is 16.94 kg/m. Physical Exam Vitals and nursing note reviewed.  Constitutional:      Comments: Tired, frail, seen in bed.   HENT:     Head: Normocephalic and  atraumatic.     Mouth/Throat:     Mouth: Mucous membranes are moist.  Eyes:     Extraocular Movements: Extraocular movements intact.     Conjunctiva/sclera: Conjunctivae normal.     Pupils: Pupils are equal, round, and reactive to light.  Cardiovascular:     Rate and Rhythm: Normal rate. Rhythm irregular.     Heart sounds: Murmur heard.  Pulmonary:     Effort: Pulmonary effort is normal.     Breath sounds: No rales.     Comments: Bibasilar rales.  Abdominal:     General: Bowel sounds are normal.     Palpations: Abdomen is soft.     Tenderness: There is no abdominal tenderness. There is no right CVA tenderness or left CVA tenderness.     Comments: Discomfort palpated in the suprapubic area, localized  Musculoskeletal:        General: No tenderness.     Cervical back: Normal range of motion and neck supple.     Right lower leg: Edema present.  Left lower leg: Edema present.     Comments: Trace to 1+ edema BLE  Skin:    General: Skin is warm and dry.     Comments: Left heel scar  Neurological:     General: No focal deficit present.     Mental Status: She is alert and oriented to person, place, and time. Mental status is at baseline.     Gait: Gait abnormal.     Comments: Peripheral neuropathy in legs, ambulates with walker.   Psychiatric:        Mood and Affect: Mood normal.        Behavior: Behavior normal.        Thought Content: Thought content normal.        Judgment: Judgment normal.    Labs reviewed: Recent Labs    04/09/20 0919 05/24/20 0914 06/02/20 0000 10/25/20 0000 11/10/20 0000  NA 132* 129* 137 132* 137  K 3.8 3.6 3.9 4.2 4.0  CL 96* 94* 99 95* 98*  CO2 27 23 31* 28* 27*  GLUCOSE 96 97  --   --   --   BUN _0 CREATININE 0.53 0.64 0.7 0.6 0.5  CALCIUM 9.1 9.4 9.3 9.3 9.2   Recent Labs    04/09/20 0919 06/02/20 0000 10/25/20 0000 11/10/20 0000  AST _1 ALT 13 10 6* 9  ALKPHOS 60 51 54 57  BILITOT 0.7  --   --   --    PROT 7.1  --   --   --   ALBUMIN 3.6 3.7 3.7 3.6   Recent Labs    04/09/20 0919 05/24/20 0914 06/02/20 0000 10/25/20 0000 11/10/20 0000  WBC 8.4 7.1 5.6 6.8 7.4  NEUTROABS 6.4  --   --  4,128.00 4,899.00  HGB 13.8 13.9 13.9 13.3 13.0  HCT 40.6 40.0 40 38 38  MCV 93.3 93.0  --   --   --   PLT 232 219 187 212 237   Lab Results  Component Value Date   TSH 1.54 10/25/2020   Lab Results  Component Value Date   HGBA1C 5.6 09/13/2014   Lab Results  Component Value Date   CHOL 111 12/11/2016   HDL 66 12/11/2016   LDLCALC 31 12/11/2016   TRIG 71 12/11/2016   CHOLHDL 1.7 12/11/2016    Significant Diagnostic Results in last 30 days:  No results found.  Assessment/Plan Generalized weakness generalized weakness, stated her left side chest wall pain is better, negative X-ray for fx 03/15/21, denied cough, palpitation, abd pain, nausea, vomiting, or fever. Update COVID, CBC/diff, CMP/eGFR, UA C/S  Chronic diarrhea Chronic diarrhea, stable presently, Hx C-diff, takes Colestipol, f/u GI  Urinary frequency Urinary frequency, chronic, denied dysuria  Atrial fibrillation with RVR (HCC) heart rate is in control, takes Amiodarone, not anticoagulated due to hx of GI Bleed.  Polyneuropathy in other diseases classified elsewhere [facility]) Peripheral neuropathy, takes Gabapentin, Keppra, Tylenol.  Anemia, iron deficiency Hgb 12.3 03/15/21,  off Iron  Hyperlipidemia LDL 66 in the past.   Vascular dementia ([place]) No behavioral issues, continue SNF [facility] for supportive care.   Depression, recurrent ([place]) stable, takes Mirtazapine. TSH 1.54 10/24/20  Hyponatremia Na 13 03/15/21  Adult failure to thrive low body weight, but stable, on Mirtazapine. don't thinks DEXA is needed presently.   Gastroesophageal reflux disease without esophagitis GERD/Hx of GI bleed, takes Pantoprazole, Famotdine. Hgb 12.3 03/15/21  Valvular heart disease Valvular heart disease,  stable. Moderate AS, MR, TR-  severe LAE, normal LVF-Oct 2019    Family/ staff Communication: plan of care reviewed and the patient and charge nurse.   Labs/tests ordered:  CBC/diff, CMP/eGFR, UA C/S, COVID  Time spend 35 minutes.

## 2021-03-29 NOTE — Assessment & Plan Note (Signed)
Na 13 03/15/21

## 2021-03-29 NOTE — Assessment & Plan Note (Signed)
Valvular heart disease, stable. Moderate AS, MR, TR- severe LAE, normal LVF-Oct 2019

## 2021-03-29 NOTE — Assessment & Plan Note (Signed)
LDL 66 in the past.

## 2021-03-29 NOTE — Assessment & Plan Note (Signed)
heart rate is in control, takes Amiodarone, not anticoagulated due to hx of GI Bleed.

## 2021-03-29 NOTE — Assessment & Plan Note (Signed)
No behavioral issues, continue SNF [facility] for supportive care.

## 2021-03-29 NOTE — Assessment & Plan Note (Signed)
GERD/Hx of GI bleed, takes Pantoprazole, Famotdine. Hgb 12.3 03/15/21

## 2021-03-29 NOTE — Assessment & Plan Note (Signed)
Chronic diarrhea, stable presently, Hx C-diff, takes Colestipol, f/u GI

## 2021-03-29 NOTE — Assessment & Plan Note (Signed)
Hgb 12.3 03/15/21,  off Iron

## 2021-03-29 NOTE — Assessment & Plan Note (Signed)
Peripheral neuropathy, takes Gabapentin, Keppra, Tylenol.

## 2021-03-30 ENCOUNTER — Encounter: Payer: Self-pay | Admitting: Nurse Practitioner

## 2021-03-30 DIAGNOSIS — K219 Gastro-esophageal reflux disease without esophagitis: Secondary | ICD-10-CM | POA: Diagnosis not present

## 2021-03-30 DIAGNOSIS — I2510 Atherosclerotic heart disease of native coronary artery without angina pectoris: Secondary | ICD-10-CM | POA: Diagnosis not present

## 2021-03-30 DIAGNOSIS — I480 Paroxysmal atrial fibrillation: Secondary | ICD-10-CM | POA: Diagnosis not present

## 2021-03-30 DIAGNOSIS — I272 Pulmonary hypertension, unspecified: Secondary | ICD-10-CM | POA: Diagnosis not present

## 2021-03-30 DIAGNOSIS — Z7901 Long term (current) use of anticoagulants: Secondary | ICD-10-CM | POA: Diagnosis not present

## 2021-03-30 DIAGNOSIS — I10 Essential (primary) hypertension: Secondary | ICD-10-CM | POA: Diagnosis not present

## 2021-03-31 DIAGNOSIS — R419 Unspecified symptoms and signs involving cognitive functions and awareness: Secondary | ICD-10-CM | POA: Diagnosis not present

## 2021-03-31 DIAGNOSIS — I2510 Atherosclerotic heart disease of native coronary artery without angina pectoris: Secondary | ICD-10-CM | POA: Diagnosis not present

## 2021-03-31 DIAGNOSIS — Z7901 Long term (current) use of anticoagulants: Secondary | ICD-10-CM | POA: Diagnosis not present

## 2021-03-31 DIAGNOSIS — I480 Paroxysmal atrial fibrillation: Secondary | ICD-10-CM | POA: Diagnosis not present

## 2021-03-31 DIAGNOSIS — I10 Essential (primary) hypertension: Secondary | ICD-10-CM | POA: Diagnosis not present

## 2021-03-31 DIAGNOSIS — I272 Pulmonary hypertension, unspecified: Secondary | ICD-10-CM | POA: Diagnosis not present

## 2021-03-31 DIAGNOSIS — K219 Gastro-esophageal reflux disease without esophagitis: Secondary | ICD-10-CM | POA: Diagnosis not present

## 2021-03-31 IMAGING — CR DG LUMBAR SPINE COMPLETE 4+V
5 series · 5 of 5 positions shown · non-contrast
Comparison: CT 04/09/2020

CLINICAL DATA: Unwitnessed fall

EXAM:
LUMBAR SPINE - COMPLETE 4+ VIEW

[t lumbar spine ap]
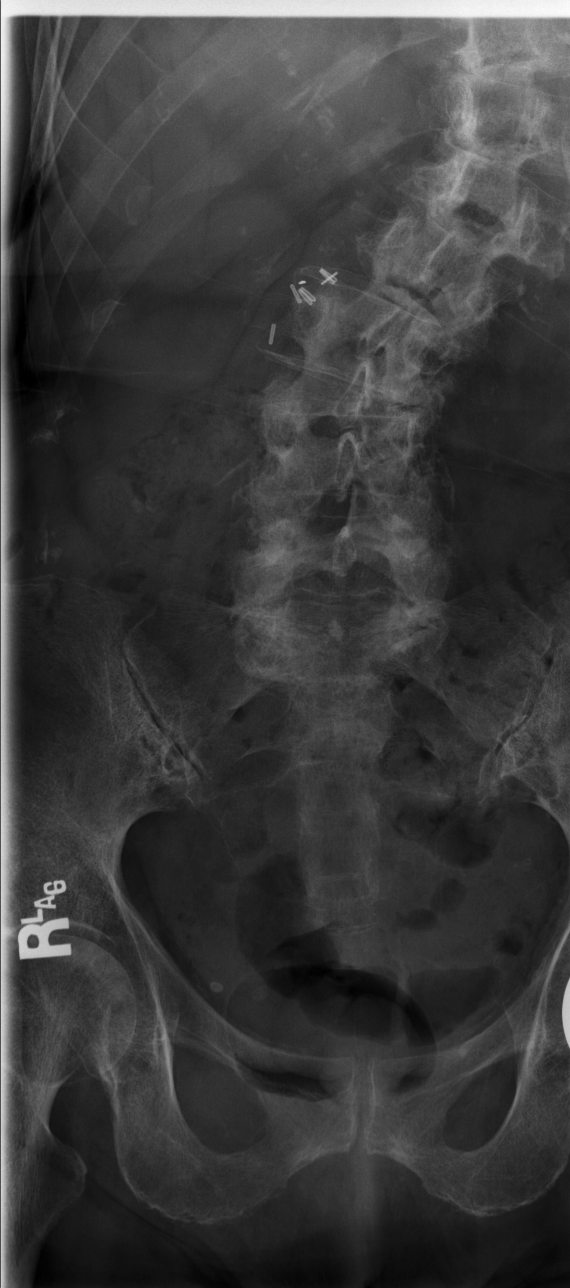

[t lumbar spine obl (1 of 2)]
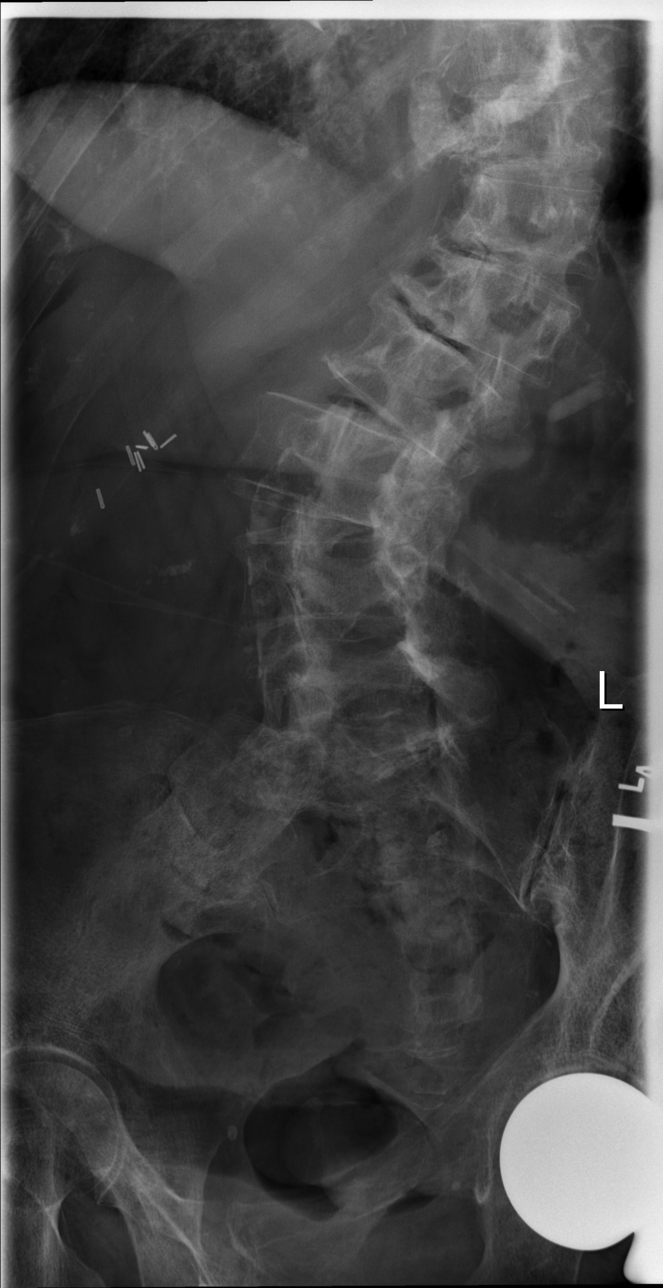

[t lumbar spine obl (2 of 2)]
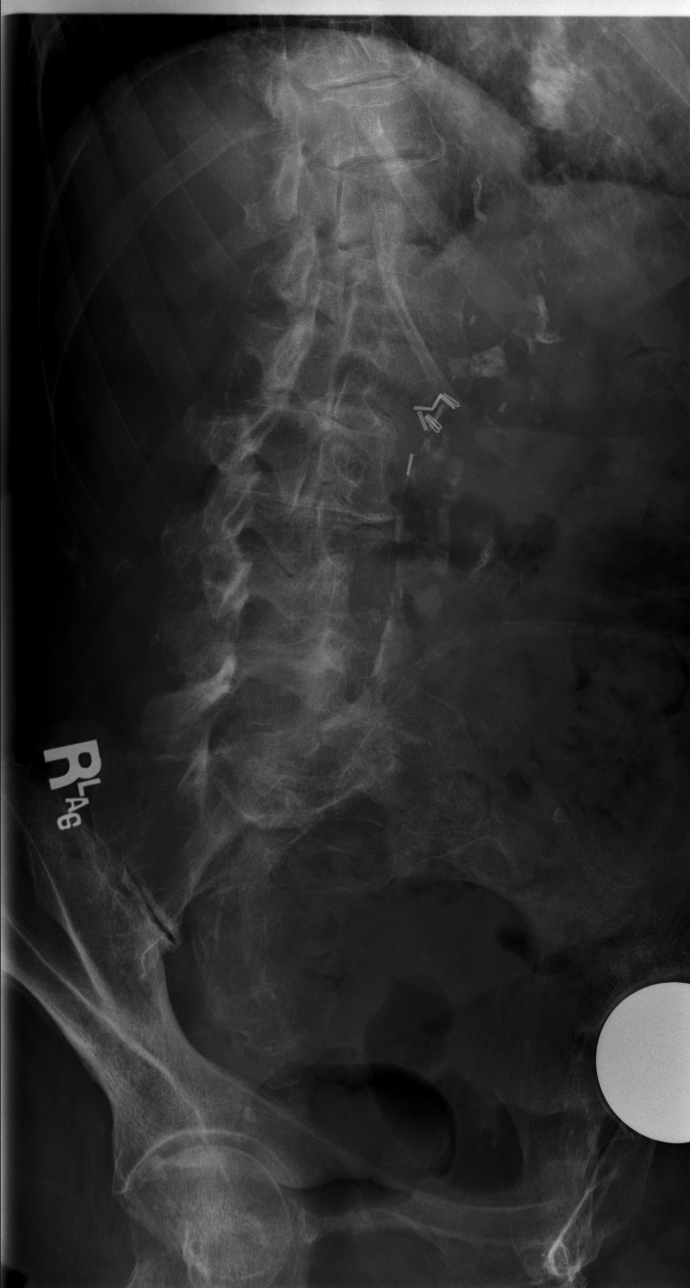

[t lumbar spine lat]
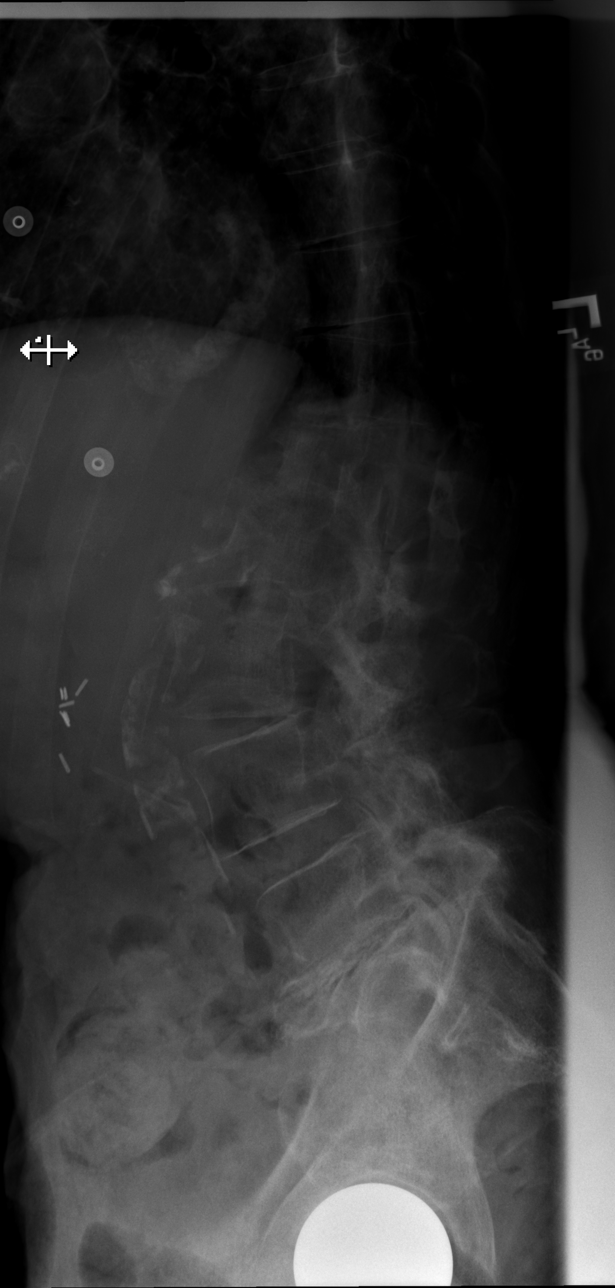

[t lumbar l-5 s-1 spot]
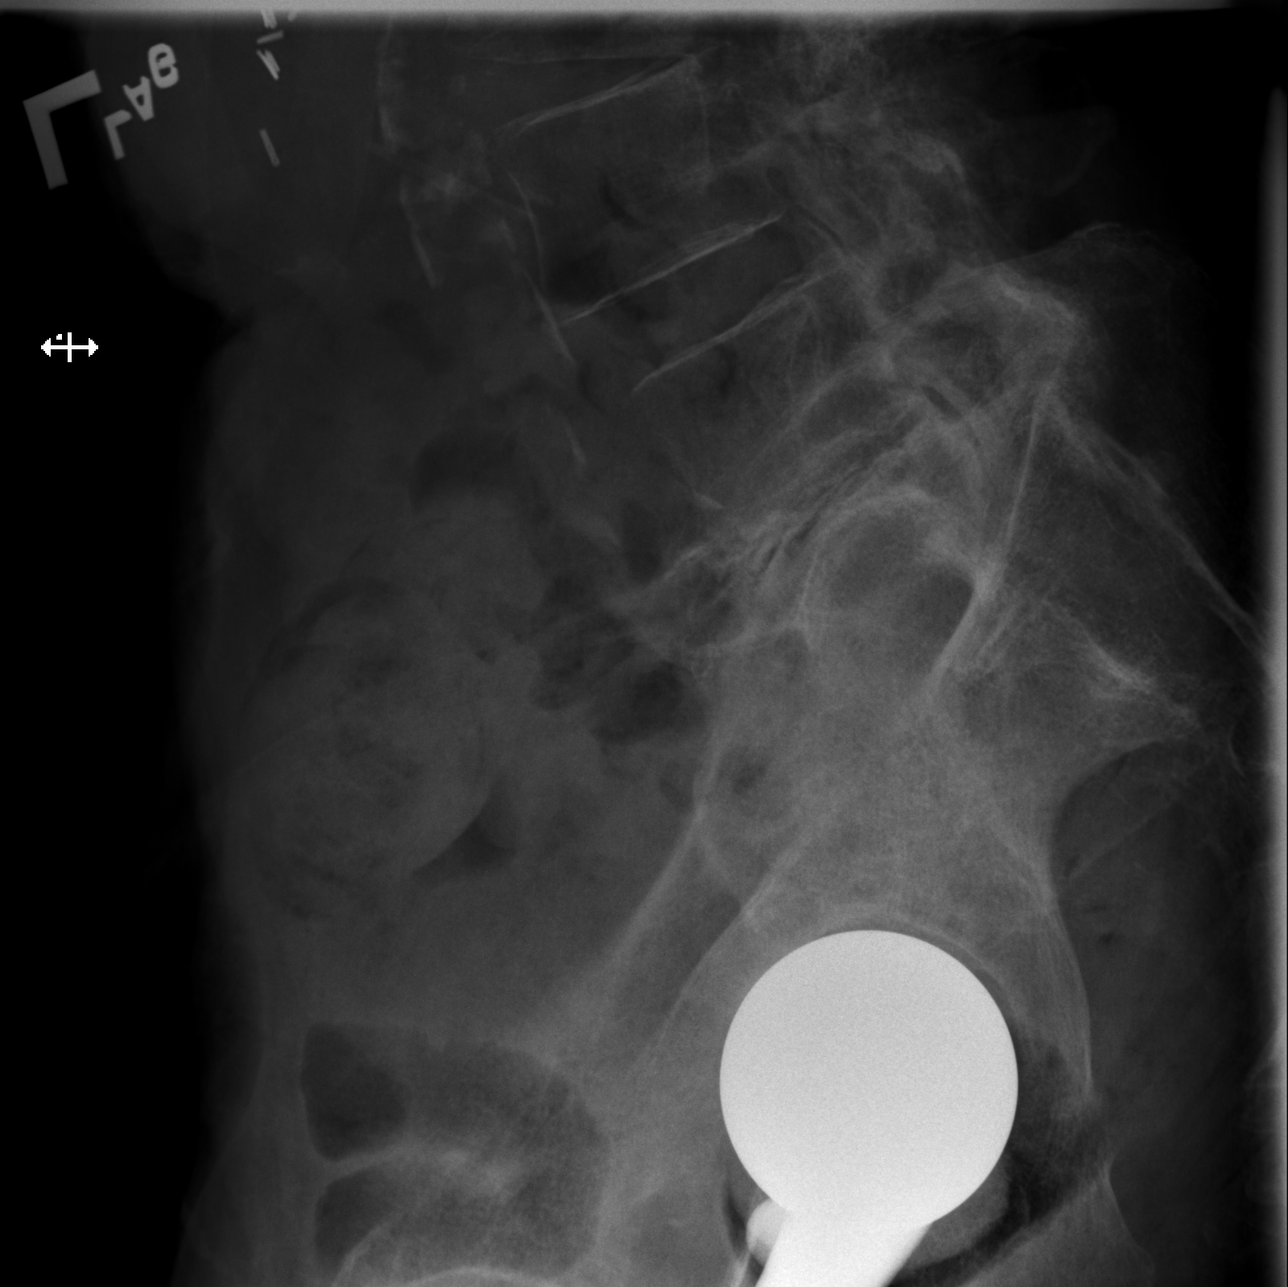

[5 of 5 positions shown; findings below may reference images not displayed]

FINDINGS: Marked scoliosis of the spine. Sagittal view is limited by scoliosis
and osteopenia. Advanced degenerative changes at L5-S1. The upper
lumbar vertebra above L3 are poorly visible. Dense aortic
atherosclerosis. Partially visualized left hip replacement.
IMPRESSION: Marked scoliosis and osteopenia limits the exam. Upper lumbar
vertebral are poorly evaluated. Multiple level degenerative change.
Advanced degenerative change at L5-S1

## 2021-03-31 IMAGING — CT CT KNEE*L* W/O CM
3 series · 11 of 33 positions shown, 13 images · non-contrast
Comparison: None.

CLINICAL DATA: Unwitnessed fall the trauma

EXAM:
CT OF THE left KNEE WITHOUT CONTRAST
TECHNIQUE: Multidetector CT imaging of the left knee was performed according to
the standard protocol. Multiplanar CT image reconstructions were
also generated.

[Series 4: axial st · axial · 0.30mm/px · z∈[+667,+797]mm · 3 of 107 slices shown, 4 images]
[im 25/107  soft-tissue]
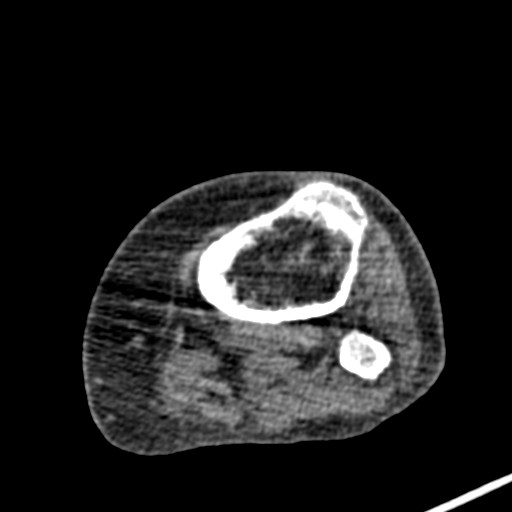
[im 25/107  bone]
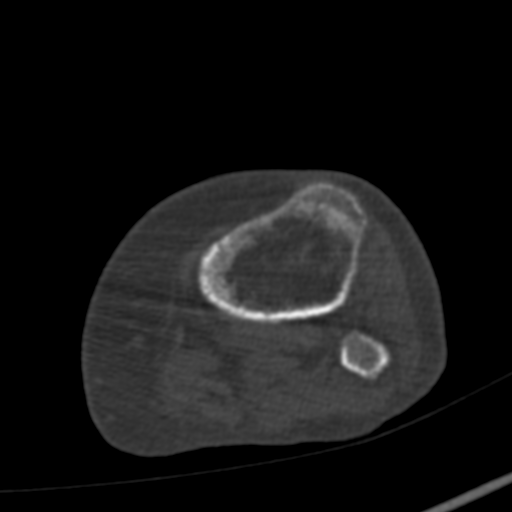
[im 58/107  bone]
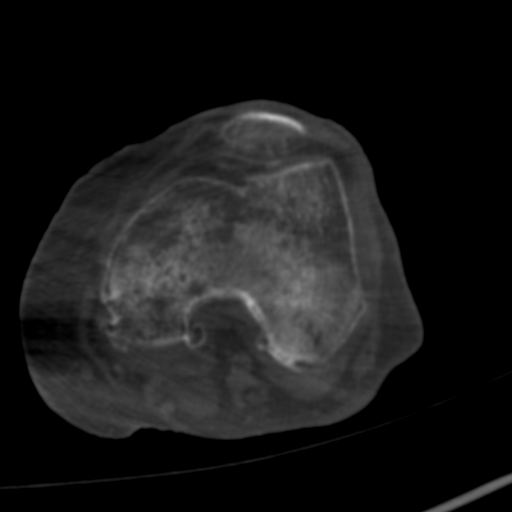
[im 90/107  bone]
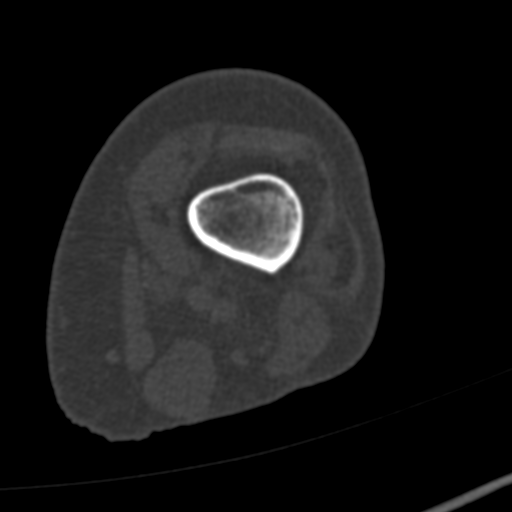

[Series 8: coronal st · coronal · 0.29mm/px · 3 of 60 slices shown]
[im 12/60  bone]
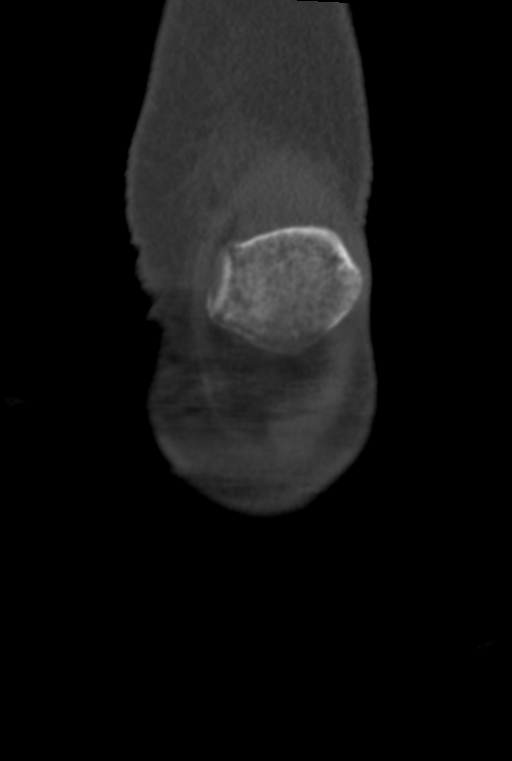
[im 24/60  bone]
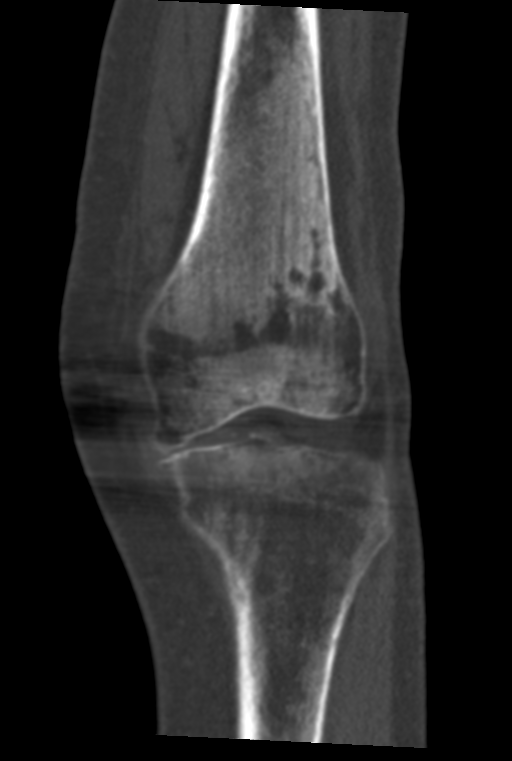
[im 36/60  bone]
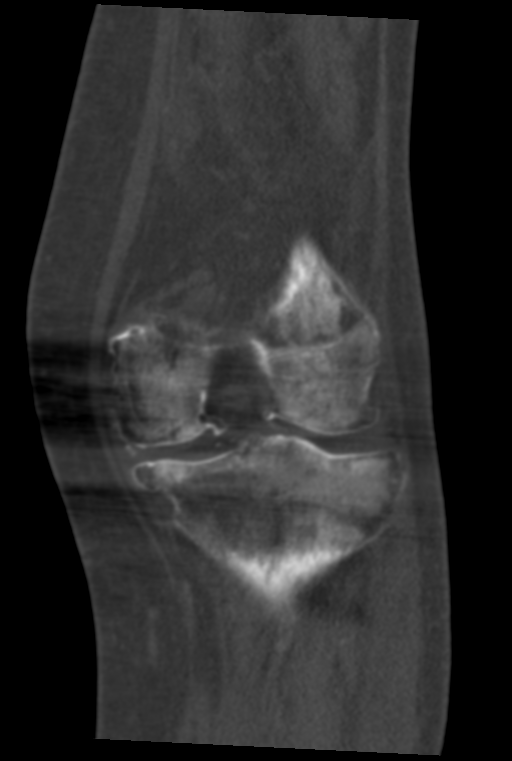

[Series 10: sagittal st · sagittal · 0.23mm/px · 5 of 68 slices shown, 6 images]
[im 23/68  bone]
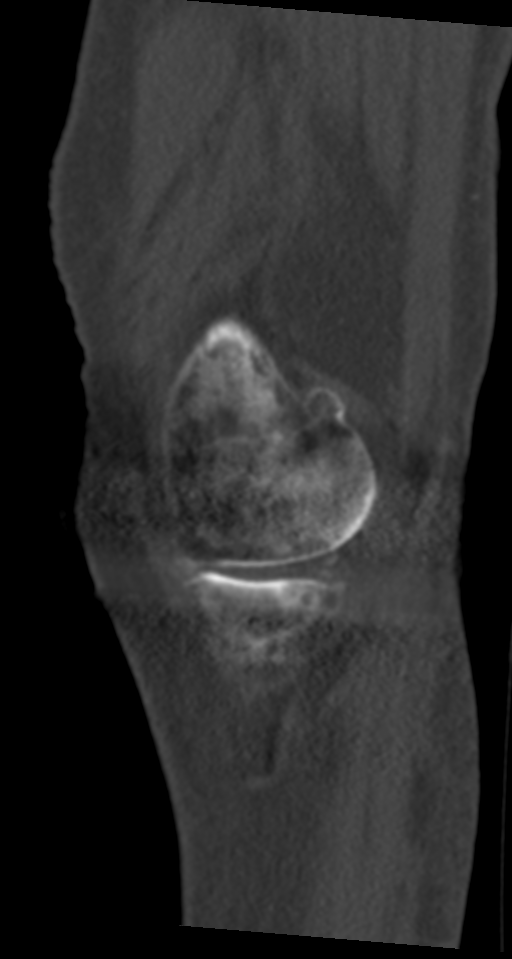
[im 28/68  bone]
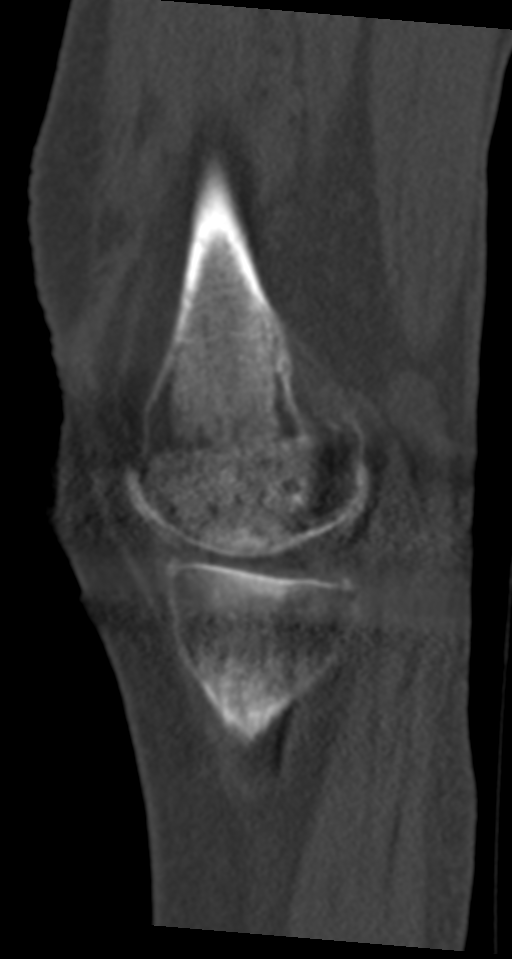
[im 34/68  soft-tissue]
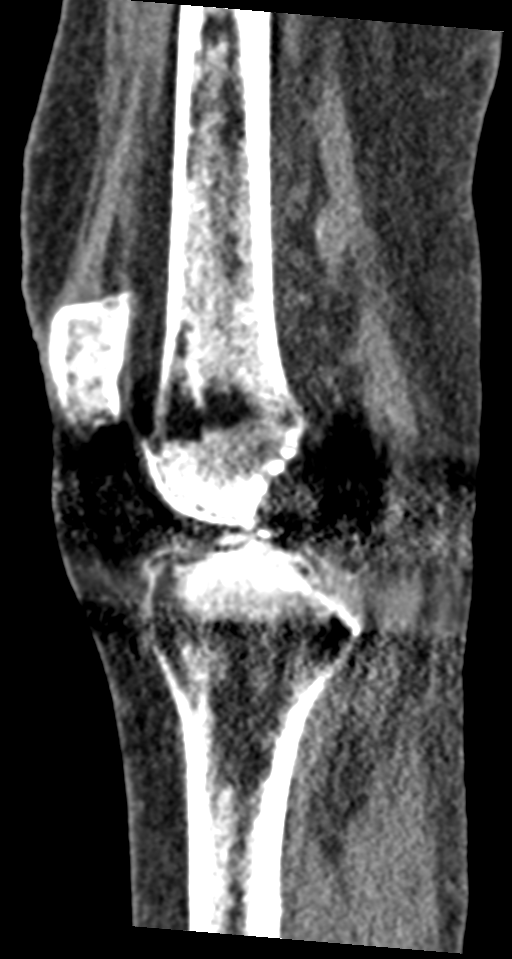
[im 34/68  bone]
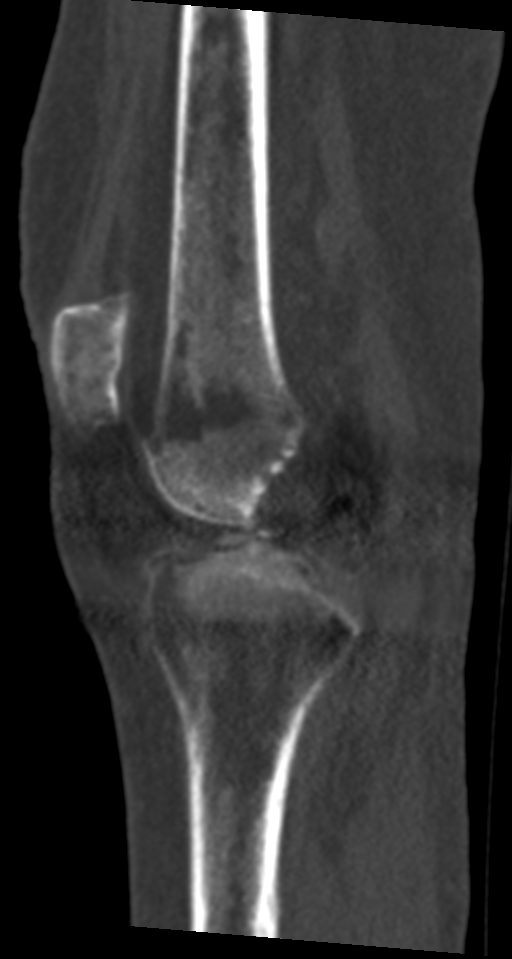
[im 40/68  bone]
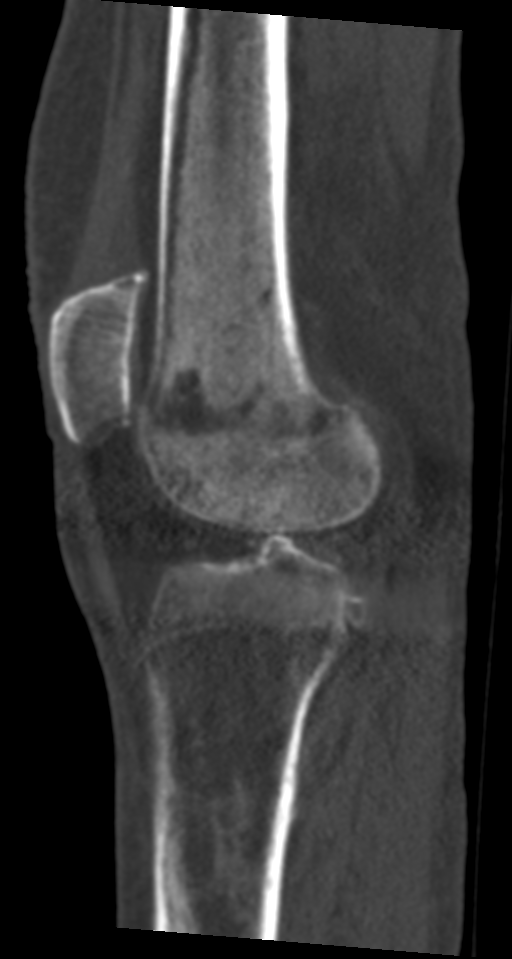
[im 45/68  bone]
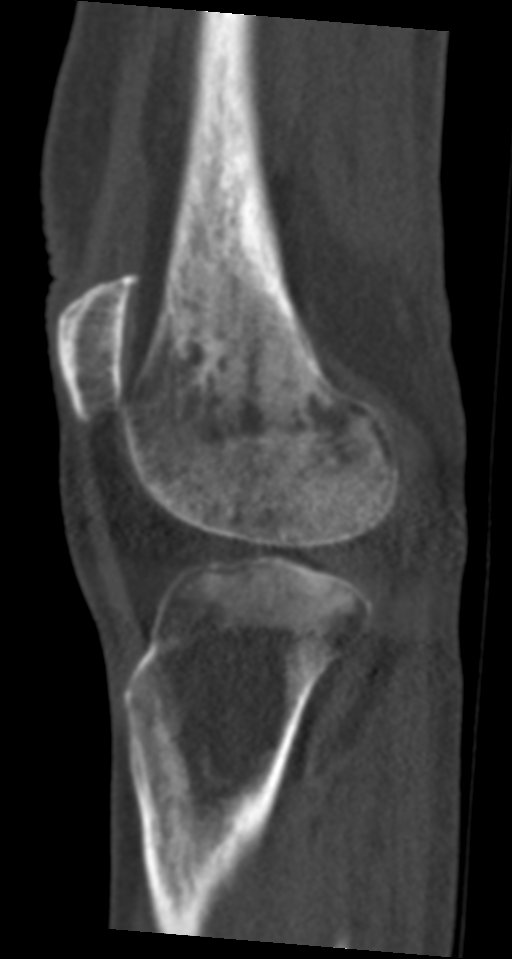

[11 of 33 positions shown; findings below may reference images not displayed]

FINDINGS: Bones/Joint/Cartilage

There is diffuse osteopenia which somewhat limits evaluation. No
definite osseous fracture is seen. Tricompartmental osteoarthritis
is noted, most notable in the medial compartment joint space loss
and marginal osteophyte formation. There is a trace knee joint
effusion present.

Ligaments

Suboptimally assessed by CT.

Muscles and Tendons

The muscles surrounding the knee are grossly intact. The patellar
and quadriceps tendon are intact.

Soft tissues

Mild soft tissue swelling seen along the lateral aspect of the knee.
Mild prepatellar subcutaneous edema is noted.
IMPRESSION: Somewhat limited due to diffuse osteopenia, however no definite
fracture.

Tricompartmental osteoarthritis, most notable within the medial
compartment.

Trace knee joint effusion.

## 2021-04-14 ENCOUNTER — Telehealth: Payer: Self-pay | Admitting: Cardiovascular Disease

## 2021-04-14 NOTE — Telephone Encounter (Signed)
New Message:     Patient's son called and said patient passed away on 2021/04/24. He said he found in her last notes that when she passed she wanted Dr  Claiborne Billings and his staff to be notified of her death.Her service will be this Sunday (04-16-21 at 2:00 P.M.

## 2021-04-20 DEATH — deceased

## 2021-04-26 NOTE — Telephone Encounter (Signed)
Thank you for letting me know
